# Patient Record
Sex: Male | Born: 1935 | Race: White | Hispanic: No | Smoking: Former smoker
Health system: Southern US, Community
[De-identification: ages and names within clinical notes are randomized; demographics above are authoritative.]

## PROBLEM LIST (undated history)

## (undated) DIAGNOSIS — I441 Atrioventricular block, second degree: Secondary | ICD-10-CM

## (undated) DIAGNOSIS — I480 Paroxysmal atrial fibrillation: Secondary | ICD-10-CM

## (undated) DIAGNOSIS — I509 Heart failure, unspecified: Secondary | ICD-10-CM

## (undated) DIAGNOSIS — D649 Anemia, unspecified: Secondary | ICD-10-CM

## (undated) DIAGNOSIS — E119 Type 2 diabetes mellitus without complications: Secondary | ICD-10-CM

## (undated) HISTORY — DX: Anemia, unspecified: D64.9

## (undated) HISTORY — DX: Paroxysmal atrial fibrillation: I48.0

## (undated) HISTORY — DX: Atrioventricular block, second degree: I44.1

## (undated) HISTORY — DX: Type 2 diabetes mellitus without complications: E11.9

## (undated) HISTORY — PX: PENILE PROSTHESIS IMPLANT: SHX240

---

## 1997-11-07 ENCOUNTER — Inpatient Hospital Stay (HOSPITAL_COMMUNITY): Admission: EM | Admit: 1997-11-07 | Discharge: 1997-11-09 | Payer: Self-pay | Admitting: Internal Medicine

## 1997-11-29 ENCOUNTER — Ambulatory Visit (HOSPITAL_COMMUNITY): Admission: RE | Admit: 1997-11-29 | Discharge: 1997-11-29 | Payer: Self-pay | Admitting: Pulmonary Disease

## 1998-01-26 HISTORY — PX: PERICARDIECTOMY: SHX2214

## 1998-01-26 HISTORY — PX: STERNOTOMY: SHX1057

## 1998-07-01 ENCOUNTER — Inpatient Hospital Stay (HOSPITAL_COMMUNITY): Admission: AD | Admit: 1998-07-01 | Discharge: 1998-07-05 | Payer: Self-pay | Admitting: Internal Medicine

## 1998-07-03 ENCOUNTER — Encounter: Payer: Self-pay | Admitting: Cardiology

## 1998-07-12 ENCOUNTER — Encounter: Payer: Self-pay | Admitting: Surgery

## 1998-07-15 ENCOUNTER — Inpatient Hospital Stay (HOSPITAL_COMMUNITY): Admission: RE | Admit: 1998-07-15 | Discharge: 1998-07-21 | Payer: Self-pay | Admitting: Surgery

## 1998-07-15 ENCOUNTER — Encounter: Payer: Self-pay | Admitting: Surgery

## 1998-07-16 ENCOUNTER — Encounter: Payer: Self-pay | Admitting: Surgery

## 1998-07-17 ENCOUNTER — Encounter: Payer: Self-pay | Admitting: Surgery

## 1998-07-18 ENCOUNTER — Encounter: Payer: Self-pay | Admitting: Surgery

## 1999-09-24 ENCOUNTER — Encounter: Admission: RE | Admit: 1999-09-24 | Discharge: 1999-12-23 | Payer: Self-pay | Admitting: Internal Medicine

## 2003-12-03 ENCOUNTER — Ambulatory Visit: Payer: Self-pay | Admitting: Adult Health

## 2004-01-02 ENCOUNTER — Ambulatory Visit: Payer: Self-pay | Admitting: Internal Medicine

## 2004-02-14 ENCOUNTER — Ambulatory Visit: Payer: Self-pay | Admitting: Internal Medicine

## 2004-04-21 ENCOUNTER — Ambulatory Visit: Payer: Self-pay | Admitting: Internal Medicine

## 2004-04-28 ENCOUNTER — Ambulatory Visit: Payer: Self-pay | Admitting: Internal Medicine

## 2004-05-27 ENCOUNTER — Ambulatory Visit: Payer: Self-pay | Admitting: Internal Medicine

## 2004-08-22 ENCOUNTER — Ambulatory Visit: Payer: Self-pay | Admitting: Internal Medicine

## 2004-10-17 ENCOUNTER — Ambulatory Visit: Payer: Self-pay | Admitting: Internal Medicine

## 2004-11-14 ENCOUNTER — Ambulatory Visit: Payer: Self-pay | Admitting: Internal Medicine

## 2004-12-26 ENCOUNTER — Ambulatory Visit: Payer: Self-pay | Admitting: Internal Medicine

## 2005-02-03 ENCOUNTER — Ambulatory Visit: Payer: Self-pay | Admitting: Internal Medicine

## 2005-03-03 ENCOUNTER — Ambulatory Visit: Payer: Self-pay | Admitting: Gastroenterology

## 2005-03-06 ENCOUNTER — Ambulatory Visit: Payer: Self-pay | Admitting: Internal Medicine

## 2005-03-26 ENCOUNTER — Ambulatory Visit: Payer: Self-pay | Admitting: Internal Medicine

## 2005-04-26 HISTORY — PX: LYMPH NODE BIOPSY: SHX201

## 2005-05-11 ENCOUNTER — Ambulatory Visit: Payer: Self-pay | Admitting: Internal Medicine

## 2005-05-25 ENCOUNTER — Ambulatory Visit (HOSPITAL_BASED_OUTPATIENT_CLINIC_OR_DEPARTMENT_OTHER): Admission: RE | Admit: 2005-05-25 | Discharge: 2005-05-25 | Payer: Self-pay | Admitting: General Surgery

## 2005-05-25 ENCOUNTER — Encounter (INDEPENDENT_AMBULATORY_CARE_PROVIDER_SITE_OTHER): Payer: Self-pay | Admitting: General Surgery

## 2005-05-25 ENCOUNTER — Encounter (INDEPENDENT_AMBULATORY_CARE_PROVIDER_SITE_OTHER): Payer: Self-pay | Admitting: *Deleted

## 2005-05-27 ENCOUNTER — Encounter: Payer: Self-pay | Admitting: Pulmonary Disease

## 2005-06-01 ENCOUNTER — Ambulatory Visit: Payer: Self-pay | Admitting: Internal Medicine

## 2005-06-01 ENCOUNTER — Ambulatory Visit: Payer: Self-pay | Admitting: Oncology

## 2005-06-03 LAB — COMPREHENSIVE METABOLIC PANEL
ALT: 27 U/L (ref 0–40)
AST: 23 U/L (ref 0–37)
CO2: 29 mEq/L (ref 19–32)
Calcium: 9.1 mg/dL (ref 8.4–10.5)
Creatinine, Ser: 1 mg/dL (ref 0.4–1.5)
Sodium: 140 mEq/L (ref 135–145)
Total Bilirubin: 0.5 mg/dL (ref 0.3–1.2)
Total Protein: 6.8 g/dL (ref 6.0–8.3)

## 2005-06-03 LAB — IRON AND TIBC
TIBC: 299 ug/dL (ref 215–435)
UIBC: 223 ug/dL

## 2005-06-03 LAB — CBC WITH DIFFERENTIAL/PLATELET
BASO%: 0.4 % (ref 0.0–2.0)
Eosinophils Absolute: 0.1 10*3/uL (ref 0.0–0.5)
MCHC: 35 g/dL (ref 32.0–35.9)
MCV: 99.6 fL — ABNORMAL HIGH (ref 81.6–98.0)
MONO#: 0.2 10*3/uL (ref 0.1–0.9)
MONO%: 5.7 % (ref 0.0–13.0)
NEUT#: 1.7 10*3/uL (ref 1.5–6.5)
RBC: 3.62 10*6/uL — ABNORMAL LOW (ref 4.20–5.71)
RDW: 13.2 % (ref 11.2–14.6)
WBC: 2.8 10*3/uL — ABNORMAL LOW (ref 4.0–10.0)

## 2005-06-03 LAB — CHCC SMEAR

## 2005-06-04 ENCOUNTER — Ambulatory Visit: Payer: Self-pay | Admitting: Cardiology

## 2005-06-10 ENCOUNTER — Ambulatory Visit (HOSPITAL_COMMUNITY): Admission: RE | Admit: 2005-06-10 | Discharge: 2005-06-10 | Payer: Self-pay | Admitting: Oncology

## 2005-06-11 ENCOUNTER — Ambulatory Visit (HOSPITAL_COMMUNITY): Admission: RE | Admit: 2005-06-11 | Discharge: 2005-06-11 | Payer: Self-pay | Admitting: Oncology

## 2005-06-12 ENCOUNTER — Ambulatory Visit (HOSPITAL_COMMUNITY): Admission: RE | Admit: 2005-06-12 | Discharge: 2005-06-12 | Payer: Self-pay | Admitting: Oncology

## 2005-06-15 ENCOUNTER — Encounter: Payer: Self-pay | Admitting: Oncology

## 2005-06-15 ENCOUNTER — Encounter (INDEPENDENT_AMBULATORY_CARE_PROVIDER_SITE_OTHER): Payer: Self-pay | Admitting: *Deleted

## 2005-06-15 ENCOUNTER — Ambulatory Visit (HOSPITAL_COMMUNITY): Admission: RE | Admit: 2005-06-15 | Discharge: 2005-06-15 | Payer: Self-pay | Admitting: Oncology

## 2005-06-15 ENCOUNTER — Ambulatory Visit: Payer: Self-pay | Admitting: Oncology

## 2005-06-24 LAB — CBC WITH DIFFERENTIAL/PLATELET
Eosinophils Absolute: 0.1 10*3/uL (ref 0.0–0.5)
HCT: 36.8 % — ABNORMAL LOW (ref 38.7–49.9)
LYMPH%: 33.8 % (ref 14.0–48.0)
MONO#: 0.2 10*3/uL (ref 0.1–0.9)
NEUT#: 1.4 10*3/uL — ABNORMAL LOW (ref 1.5–6.5)
NEUT%: 55.7 % (ref 40.0–75.0)
Platelets: 155 10*3/uL (ref 145–400)
RBC: 3.7 10*6/uL — ABNORMAL LOW (ref 4.20–5.71)
WBC: 2.5 10*3/uL — ABNORMAL LOW (ref 4.0–10.0)

## 2005-06-24 LAB — COMPREHENSIVE METABOLIC PANEL
ALT: 23 U/L (ref 0–40)
AST: 20 U/L (ref 0–37)
Alkaline Phosphatase: 45 U/L (ref 39–117)
CO2: 28 mEq/L (ref 19–32)
Sodium: 141 mEq/L (ref 135–145)
Total Bilirubin: 0.4 mg/dL (ref 0.3–1.2)
Total Protein: 7 g/dL (ref 6.0–8.3)

## 2005-07-09 LAB — CBC WITH DIFFERENTIAL/PLATELET
Basophils Absolute: 0 10*3/uL (ref 0.0–0.1)
Eosinophils Absolute: 0 10*3/uL (ref 0.0–0.5)
HGB: 12.1 g/dL — ABNORMAL LOW (ref 13.0–17.1)
MONO#: 0 10*3/uL — ABNORMAL LOW (ref 0.1–0.9)
NEUT#: 3.1 10*3/uL (ref 1.5–6.5)
RDW: 13.5 % (ref 11.2–14.6)
WBC: 3.9 10*3/uL — ABNORMAL LOW (ref 4.0–10.0)
lymph#: 0.8 10*3/uL — ABNORMAL LOW (ref 0.9–3.3)

## 2005-07-13 ENCOUNTER — Ambulatory Visit: Payer: Self-pay | Admitting: Internal Medicine

## 2005-07-16 LAB — COMPREHENSIVE METABOLIC PANEL
ALT: 26 U/L (ref 0–40)
Albumin: 4.1 g/dL (ref 3.5–5.2)
CO2: 28 mEq/L (ref 19–32)
Glucose, Bld: 165 mg/dL — ABNORMAL HIGH (ref 70–99)
Potassium: 3.8 mEq/L (ref 3.5–5.3)
Sodium: 135 mEq/L (ref 135–145)
Total Protein: 6.7 g/dL (ref 6.0–8.3)

## 2005-07-16 LAB — CBC WITH DIFFERENTIAL/PLATELET
Eosinophils Absolute: 0 10*3/uL (ref 0.0–0.5)
MONO#: 0.1 10*3/uL (ref 0.1–0.9)
NEUT#: 5.7 10*3/uL (ref 1.5–6.5)
RBC: 3.52 10*6/uL — ABNORMAL LOW (ref 4.20–5.71)
RDW: 14.1 % (ref 11.2–14.6)
WBC: 6.6 10*3/uL (ref 4.0–10.0)
lymph#: 0.8 10*3/uL — ABNORMAL LOW (ref 0.9–3.3)

## 2005-07-17 ENCOUNTER — Ambulatory Visit: Payer: Self-pay | Admitting: Oncology

## 2005-08-05 LAB — CBC WITH DIFFERENTIAL/PLATELET
Basophils Absolute: 0 10*3/uL (ref 0.0–0.1)
Eosinophils Absolute: 0 10*3/uL (ref 0.0–0.5)
HGB: 11.5 g/dL — ABNORMAL LOW (ref 13.0–17.1)
MCV: 96.6 fL (ref 81.6–98.0)
MONO%: 7 % (ref 0.0–13.0)
NEUT#: 3.1 10*3/uL (ref 1.5–6.5)
RDW: 15.3 % — ABNORMAL HIGH (ref 11.2–14.6)

## 2005-08-05 LAB — COMPREHENSIVE METABOLIC PANEL
Albumin: 4.2 g/dL (ref 3.5–5.2)
Alkaline Phosphatase: 42 U/L (ref 39–117)
BUN: 19 mg/dL (ref 6–23)
CO2: 27 mEq/L (ref 19–32)
Calcium: 9.1 mg/dL (ref 8.4–10.5)
Chloride: 103 mEq/L (ref 96–112)
Glucose, Bld: 185 mg/dL — ABNORMAL HIGH (ref 70–99)
Potassium: 4.1 mEq/L (ref 3.5–5.3)

## 2005-08-26 LAB — CBC WITH DIFFERENTIAL/PLATELET
Eosinophils Absolute: 0 10*3/uL (ref 0.0–0.5)
HCT: 31.7 % — ABNORMAL LOW (ref 38.7–49.9)
LYMPH%: 14.3 % (ref 14.0–48.0)
MCV: 95.9 fL (ref 81.6–98.0)
MONO#: 0.5 10*3/uL (ref 0.1–0.9)
NEUT#: 4.7 10*3/uL (ref 1.5–6.5)
NEUT%: 76 % — ABNORMAL HIGH (ref 40.0–75.0)
Platelets: 298 10*3/uL (ref 145–400)
WBC: 6.2 10*3/uL (ref 4.0–10.0)

## 2005-08-26 LAB — COMPREHENSIVE METABOLIC PANEL
BUN: 16 mg/dL (ref 6–23)
CO2: 27 mEq/L (ref 19–32)
Creatinine, Ser: 0.96 mg/dL (ref 0.40–1.50)
Glucose, Bld: 203 mg/dL — ABNORMAL HIGH (ref 70–99)
Total Bilirubin: 0.4 mg/dL (ref 0.3–1.2)

## 2005-08-26 LAB — LACTATE DEHYDROGENASE: LDH: 208 U/L (ref 94–250)

## 2005-08-28 ENCOUNTER — Ambulatory Visit: Payer: Self-pay | Admitting: Oncology

## 2005-09-02 ENCOUNTER — Ambulatory Visit (HOSPITAL_COMMUNITY): Admission: RE | Admit: 2005-09-02 | Discharge: 2005-09-02 | Payer: Self-pay | Admitting: Oncology

## 2005-09-16 LAB — CBC WITH DIFFERENTIAL/PLATELET
BASO%: 0.3 % (ref 0.0–2.0)
Basophils Absolute: 0 10*3/uL (ref 0.0–0.1)
HCT: 32.5 % — ABNORMAL LOW (ref 38.7–49.9)
HGB: 11.3 g/dL — ABNORMAL LOW (ref 13.0–17.1)
MCHC: 34.7 g/dL (ref 32.0–35.9)
MONO#: 0.4 10*3/uL (ref 0.1–0.9)
NEUT#: 3.9 10*3/uL (ref 1.5–6.5)
NEUT%: 77.3 % — ABNORMAL HIGH (ref 40.0–75.0)
WBC: 5.1 10*3/uL (ref 4.0–10.0)
lymph#: 0.7 10*3/uL — ABNORMAL LOW (ref 0.9–3.3)

## 2005-09-16 LAB — COMPREHENSIVE METABOLIC PANEL
Alkaline Phosphatase: 45 U/L (ref 39–117)
Glucose, Bld: 186 mg/dL — ABNORMAL HIGH (ref 70–99)
Sodium: 141 mEq/L (ref 135–145)
Total Bilirubin: 0.4 mg/dL (ref 0.3–1.2)
Total Protein: 6.5 g/dL (ref 6.0–8.3)

## 2005-10-07 LAB — CBC WITH DIFFERENTIAL/PLATELET
Basophils Absolute: 0 10*3/uL (ref 0.0–0.1)
Eosinophils Absolute: 0 10*3/uL (ref 0.0–0.5)
HCT: 25.7 % — ABNORMAL LOW (ref 38.7–49.9)
HGB: 8.8 g/dL — ABNORMAL LOW (ref 13.0–17.1)
LYMPH%: 3.9 % — ABNORMAL LOW (ref 14.0–48.0)
MCHC: 34.3 g/dL (ref 32.0–35.9)
MONO#: 0.7 10*3/uL (ref 0.1–0.9)
NEUT#: 7.8 10*3/uL — ABNORMAL HIGH (ref 1.5–6.5)
NEUT%: 88.3 % — ABNORMAL HIGH (ref 40.0–75.0)
Platelets: 161 10*3/uL (ref 145–400)
WBC: 8.9 10*3/uL (ref 4.0–10.0)

## 2005-10-07 LAB — COMPREHENSIVE METABOLIC PANEL
BUN: 11 mg/dL (ref 6–23)
CO2: 28 mEq/L (ref 19–32)
Calcium: 8.3 mg/dL — ABNORMAL LOW (ref 8.4–10.5)
Creatinine, Ser: 0.96 mg/dL (ref 0.40–1.50)
Glucose, Bld: 196 mg/dL — ABNORMAL HIGH (ref 70–99)
Total Bilirubin: 0.8 mg/dL (ref 0.3–1.2)

## 2005-10-07 LAB — LACTATE DEHYDROGENASE: LDH: 154 U/L (ref 94–250)

## 2005-10-11 ENCOUNTER — Encounter: Payer: Self-pay | Admitting: Vascular Surgery

## 2005-10-11 ENCOUNTER — Inpatient Hospital Stay (HOSPITAL_COMMUNITY): Admission: EM | Admit: 2005-10-11 | Discharge: 2005-10-15 | Payer: Self-pay | Admitting: Emergency Medicine

## 2005-10-12 ENCOUNTER — Ambulatory Visit: Payer: Self-pay | Admitting: Oncology

## 2005-10-13 ENCOUNTER — Ambulatory Visit: Payer: Self-pay | Admitting: Oncology

## 2005-10-20 LAB — CBC WITH DIFFERENTIAL/PLATELET
BASO%: 0.3 % (ref 0.0–2.0)
Basophils Absolute: 0 10*3/uL (ref 0.0–0.1)
HCT: 34.5 % — ABNORMAL LOW (ref 38.7–49.9)
HGB: 11.7 g/dL — ABNORMAL LOW (ref 13.0–17.1)
MONO#: 0.5 10*3/uL (ref 0.1–0.9)
NEUT%: 72 % (ref 40.0–75.0)
RDW: 17.3 % — ABNORMAL HIGH (ref 11.2–14.6)
WBC: 4.9 10*3/uL (ref 4.0–10.0)
lymph#: 0.8 10*3/uL — ABNORMAL LOW (ref 0.9–3.3)

## 2005-10-20 LAB — COMPREHENSIVE METABOLIC PANEL
AST: 19 U/L (ref 0–37)
Alkaline Phosphatase: 57 U/L (ref 39–117)
BUN: 15 mg/dL (ref 6–23)
Calcium: 9.4 mg/dL (ref 8.4–10.5)
Chloride: 100 mEq/L (ref 96–112)
Creatinine, Ser: 0.97 mg/dL (ref 0.40–1.50)

## 2005-10-22 ENCOUNTER — Ambulatory Visit: Payer: Self-pay | Admitting: Infectious Diseases

## 2005-10-22 ENCOUNTER — Ambulatory Visit (HOSPITAL_COMMUNITY): Admission: RE | Admit: 2005-10-22 | Discharge: 2005-10-23 | Payer: Self-pay | Admitting: Urology

## 2005-11-10 LAB — COMPREHENSIVE METABOLIC PANEL
Alkaline Phosphatase: 45 U/L (ref 39–117)
Creatinine, Ser: 0.94 mg/dL (ref 0.40–1.50)
Glucose, Bld: 116 mg/dL — ABNORMAL HIGH (ref 70–99)
Sodium: 141 mEq/L (ref 135–145)
Total Bilirubin: 0.5 mg/dL (ref 0.3–1.2)
Total Protein: 6.9 g/dL (ref 6.0–8.3)

## 2005-11-10 LAB — CBC WITH DIFFERENTIAL/PLATELET
Eosinophils Absolute: 0.2 10*3/uL (ref 0.0–0.5)
LYMPH%: 25.9 % (ref 14.0–48.0)
MCHC: 34.6 g/dL (ref 32.0–35.9)
MCV: 93.4 fL (ref 81.6–98.0)
MONO%: 13.3 % — ABNORMAL HIGH (ref 0.0–13.0)
NEUT#: 1.7 10*3/uL (ref 1.5–6.5)
NEUT%: 52.6 % (ref 40.0–75.0)
Platelets: 167 10*3/uL (ref 145–400)
RBC: 3.92 10*6/uL — ABNORMAL LOW (ref 4.20–5.71)

## 2005-11-12 ENCOUNTER — Ambulatory Visit: Payer: Self-pay | Admitting: Internal Medicine

## 2005-11-19 ENCOUNTER — Ambulatory Visit: Payer: Self-pay | Admitting: Internal Medicine

## 2005-11-24 ENCOUNTER — Ambulatory Visit: Payer: Self-pay | Admitting: Oncology

## 2005-11-24 LAB — COMPREHENSIVE METABOLIC PANEL
Albumin: 4.1 g/dL (ref 3.5–5.2)
Alkaline Phosphatase: 41 U/L (ref 39–117)
BUN: 16 mg/dL (ref 6–23)
CO2: 29 mEq/L (ref 19–32)
Calcium: 9.2 mg/dL (ref 8.4–10.5)
Chloride: 101 mEq/L (ref 96–112)
Glucose, Bld: 163 mg/dL — ABNORMAL HIGH (ref 70–99)
Potassium: 3.9 mEq/L (ref 3.5–5.3)
Total Protein: 6.6 g/dL (ref 6.0–8.3)

## 2005-11-24 LAB — CBC WITH DIFFERENTIAL/PLATELET
Basophils Absolute: 0 10*3/uL (ref 0.0–0.1)
Eosinophils Absolute: 0.1 10*3/uL (ref 0.0–0.5)
HGB: 13.2 g/dL (ref 13.0–17.1)
LYMPH%: 23 % (ref 14.0–48.0)
MONO#: 0.4 10*3/uL (ref 0.1–0.9)
NEUT#: 1.6 10*3/uL (ref 1.5–6.5)
Platelets: 153 10*3/uL (ref 145–400)
RBC: 4.05 10*6/uL — ABNORMAL LOW (ref 4.20–5.71)
WBC: 2.7 10*3/uL — ABNORMAL LOW (ref 4.0–10.0)

## 2005-12-11 ENCOUNTER — Ambulatory Visit: Payer: Self-pay | Admitting: Internal Medicine

## 2005-12-15 ENCOUNTER — Ambulatory Visit: Payer: Self-pay | Admitting: Pulmonary Disease

## 2005-12-15 ENCOUNTER — Ambulatory Visit (HOSPITAL_COMMUNITY): Admission: RE | Admit: 2005-12-15 | Discharge: 2005-12-15 | Payer: Self-pay | Admitting: Oncology

## 2005-12-18 LAB — CBC WITH DIFFERENTIAL/PLATELET
BASO%: 0.6 % (ref 0.0–2.0)
LYMPH%: 16.2 % (ref 14.0–48.0)
MCH: 32.5 pg (ref 28.0–33.4)
MCHC: 34.7 g/dL (ref 32.0–35.9)
MCV: 93.9 fL (ref 81.6–98.0)
MONO%: 14.5 % — ABNORMAL HIGH (ref 0.0–13.0)
Platelets: 257 10*3/uL (ref 145–400)
RBC: 3.85 10*6/uL — ABNORMAL LOW (ref 4.20–5.71)
WBC: 4.9 10*3/uL (ref 4.0–10.0)

## 2005-12-18 LAB — COMPREHENSIVE METABOLIC PANEL
ALT: 22 U/L (ref 0–53)
Alkaline Phosphatase: 37 U/L — ABNORMAL LOW (ref 39–117)
Sodium: 141 mEq/L (ref 135–145)
Total Bilirubin: 0.4 mg/dL (ref 0.3–1.2)
Total Protein: 6.7 g/dL (ref 6.0–8.3)

## 2005-12-21 ENCOUNTER — Ambulatory Visit (HOSPITAL_BASED_OUTPATIENT_CLINIC_OR_DEPARTMENT_OTHER): Admission: RE | Admit: 2005-12-21 | Discharge: 2005-12-21 | Payer: Self-pay | Admitting: Urology

## 2005-12-29 ENCOUNTER — Ambulatory Visit: Payer: Self-pay | Admitting: Pulmonary Disease

## 2006-02-02 ENCOUNTER — Ambulatory Visit: Payer: Self-pay | Admitting: Oncology

## 2006-02-26 ENCOUNTER — Ambulatory Visit: Payer: Self-pay | Admitting: Internal Medicine

## 2006-03-19 ENCOUNTER — Ambulatory Visit: Payer: Self-pay | Admitting: Oncology

## 2006-03-19 LAB — CBC WITH DIFFERENTIAL/PLATELET
BASO%: 0.3 % (ref 0.0–2.0)
Basophils Absolute: 0 10*3/uL (ref 0.0–0.1)
EOS%: 3.3 % (ref 0.0–7.0)
HCT: 38.6 % — ABNORMAL LOW (ref 38.7–49.9)
HGB: 13.9 g/dL (ref 13.0–17.1)
LYMPH%: 33 % (ref 14.0–48.0)
MCH: 34.4 pg — ABNORMAL HIGH (ref 28.0–33.4)
MCHC: 35.9 g/dL (ref 32.0–35.9)
MCV: 95.8 fL (ref 81.6–98.0)
NEUT%: 52.7 % (ref 40.0–75.0)
Platelets: 162 10*3/uL (ref 145–400)

## 2006-03-19 LAB — COMPREHENSIVE METABOLIC PANEL
ALT: 24 U/L (ref 0–53)
AST: 19 U/L (ref 0–37)
BUN: 18 mg/dL (ref 6–23)
CO2: 31 mEq/L (ref 19–32)
Calcium: 9.5 mg/dL (ref 8.4–10.5)
Creatinine, Ser: 0.99 mg/dL (ref 0.40–1.50)
Total Bilirubin: 0.5 mg/dL (ref 0.3–1.2)

## 2006-04-19 ENCOUNTER — Ambulatory Visit: Payer: Self-pay | Admitting: Internal Medicine

## 2006-05-04 ENCOUNTER — Ambulatory Visit (HOSPITAL_COMMUNITY): Admission: RE | Admit: 2006-05-04 | Discharge: 2006-05-04 | Payer: Self-pay | Admitting: Oncology

## 2006-06-14 ENCOUNTER — Ambulatory Visit: Payer: Self-pay | Admitting: Pulmonary Disease

## 2006-06-17 ENCOUNTER — Ambulatory Visit: Payer: Self-pay | Admitting: Oncology

## 2006-06-18 ENCOUNTER — Ambulatory Visit: Payer: Self-pay | Admitting: Internal Medicine

## 2006-06-18 LAB — CONVERTED CEMR LAB
CO2: 35 meq/L — ABNORMAL HIGH (ref 19–32)
GFR calc Af Amer: 95 mL/min
Glucose, Bld: 113 mg/dL — ABNORMAL HIGH (ref 70–99)
Potassium: 4.8 meq/L (ref 3.5–5.1)

## 2006-06-22 LAB — COMPREHENSIVE METABOLIC PANEL
BUN: 19 mg/dL (ref 6–23)
CO2: 27 mEq/L (ref 19–32)
Calcium: 9.4 mg/dL (ref 8.4–10.5)
Chloride: 102 mEq/L (ref 96–112)
Creatinine, Ser: 0.98 mg/dL (ref 0.40–1.50)
Glucose, Bld: 162 mg/dL — ABNORMAL HIGH (ref 70–99)
Total Bilirubin: 0.7 mg/dL (ref 0.3–1.2)

## 2006-06-22 LAB — CBC WITH DIFFERENTIAL/PLATELET
BASO%: 0.5 % (ref 0.0–2.0)
Basophils Absolute: 0 10*3/uL (ref 0.0–0.1)
HCT: 36 % — ABNORMAL LOW (ref 38.7–49.9)
HGB: 13.1 g/dL (ref 13.0–17.1)
LYMPH%: 28.8 % (ref 14.0–48.0)
MCHC: 36.5 g/dL — ABNORMAL HIGH (ref 32.0–35.9)
MONO#: 0.2 10*3/uL (ref 0.1–0.9)
NEUT%: 61.4 % (ref 40.0–75.0)
Platelets: 159 10*3/uL (ref 145–400)
WBC: 2.5 10*3/uL — ABNORMAL LOW (ref 4.0–10.0)
lymph#: 0.7 10*3/uL — ABNORMAL LOW (ref 0.9–3.3)

## 2006-06-24 ENCOUNTER — Ambulatory Visit (HOSPITAL_COMMUNITY): Admission: RE | Admit: 2006-06-24 | Discharge: 2006-06-24 | Payer: Self-pay | Admitting: Critical Care Medicine

## 2006-06-30 ENCOUNTER — Ambulatory Visit (HOSPITAL_COMMUNITY): Admission: RE | Admit: 2006-06-30 | Discharge: 2006-06-30 | Payer: Self-pay | Admitting: Oncology

## 2006-07-26 ENCOUNTER — Ambulatory Visit: Payer: Self-pay | Admitting: Internal Medicine

## 2006-10-08 ENCOUNTER — Ambulatory Visit: Payer: Self-pay | Admitting: Oncology

## 2006-10-12 LAB — COMPREHENSIVE METABOLIC PANEL
AST: 18 U/L (ref 0–37)
Alkaline Phosphatase: 36 U/L — ABNORMAL LOW (ref 39–117)
BUN: 13 mg/dL (ref 6–23)
Glucose, Bld: 119 mg/dL — ABNORMAL HIGH (ref 70–99)
Sodium: 137 mEq/L (ref 135–145)
Total Bilirubin: 0.6 mg/dL (ref 0.3–1.2)

## 2006-10-12 LAB — CBC WITH DIFFERENTIAL/PLATELET
Basophils Absolute: 0 10*3/uL (ref 0.0–0.1)
EOS%: 3.6 % (ref 0.0–7.0)
Eosinophils Absolute: 0.1 10*3/uL (ref 0.0–0.5)
LYMPH%: 34.4 % (ref 14.0–48.0)
MCH: 35.6 pg — ABNORMAL HIGH (ref 28.0–33.4)
MCV: 99.3 fL — ABNORMAL HIGH (ref 81.6–98.0)
MONO%: 9.3 % (ref 0.0–13.0)
NEUT#: 1.6 10*3/uL (ref 1.5–6.5)
Platelets: 165 10*3/uL (ref 145–400)
RBC: 3.51 10*6/uL — ABNORMAL LOW (ref 4.20–5.71)

## 2006-10-18 ENCOUNTER — Ambulatory Visit (HOSPITAL_COMMUNITY): Admission: RE | Admit: 2006-10-18 | Discharge: 2006-10-18 | Payer: Self-pay | Admitting: Oncology

## 2006-11-08 ENCOUNTER — Ambulatory Visit: Payer: Self-pay | Admitting: Internal Medicine

## 2006-11-08 LAB — CONVERTED CEMR LAB
ALT: 22 units/L (ref 0–53)
Alkaline Phosphatase: 31 units/L — ABNORMAL LOW (ref 39–117)
BUN: 17 mg/dL (ref 6–23)
Basophils Relative: 0.6 % (ref 0.0–1.0)
CRP, High Sensitivity: 4 (ref 0.00–5.00)
Calcium: 9.4 mg/dL (ref 8.4–10.5)
Eosinophils Relative: 4.1 % (ref 0.0–5.0)
GFR calc non Af Amer: 70 mL/min
Ketones, ur: NEGATIVE mg/dL
Nitrite: NEGATIVE
Platelets: 156 10*3/uL (ref 150–400)
Potassium: 4.8 meq/L (ref 3.5–5.1)
RBC: 3.55 M/uL — ABNORMAL LOW (ref 4.22–5.81)
RDW: 13.1 % (ref 11.5–14.6)
Specific Gravity, Urine: >=1.03 (ref 1.000–1.03)
Total Bilirubin: 1.3 mg/dL — ABNORMAL HIGH (ref 0.3–1.2)
Total CHOL/HDL Ratio: 5.2
Total Protein, Urine: NEGATIVE mg/dL
Total Protein: 6.6 g/dL (ref 6.0–8.3)
Triglycerides: 277 mg/dL (ref 0–149)
Urine Glucose: NEGATIVE mg/dL
WBC: 2.5 10*3/uL — ABNORMAL LOW (ref 4.5–10.5)
pH: 5.5 (ref 5.0–8.0)

## 2006-11-15 ENCOUNTER — Ambulatory Visit: Payer: Self-pay | Admitting: Internal Medicine

## 2006-11-18 ENCOUNTER — Ambulatory Visit: Payer: Self-pay

## 2006-12-17 ENCOUNTER — Ambulatory Visit: Payer: Self-pay | Admitting: Internal Medicine

## 2007-01-07 ENCOUNTER — Ambulatory Visit: Payer: Self-pay | Admitting: Oncology

## 2007-01-11 ENCOUNTER — Encounter: Payer: Self-pay | Admitting: Internal Medicine

## 2007-01-11 LAB — CBC WITH DIFFERENTIAL/PLATELET
Basophils Absolute: 0 10*3/uL (ref 0.0–0.1)
Eosinophils Absolute: 0.1 10*3/uL (ref 0.0–0.5)
HCT: 36 % — ABNORMAL LOW (ref 38.7–49.9)
HGB: 13 g/dL (ref 13.0–17.1)
LYMPH%: 35.1 % (ref 14.0–48.0)
MONO#: 0.2 10*3/uL (ref 0.1–0.9)
NEUT#: 1 10*3/uL — ABNORMAL LOW (ref 1.5–6.5)
NEUT%: 53 % (ref 40.0–75.0)
Platelets: 143 10*3/uL — ABNORMAL LOW (ref 145–400)
WBC: 1.9 10*3/uL — ABNORMAL LOW (ref 4.0–10.0)

## 2007-01-11 LAB — COMPREHENSIVE METABOLIC PANEL
ALT: 20 U/L (ref 0–53)
Albumin: 4.4 g/dL (ref 3.5–5.2)
CO2: 28 mEq/L (ref 19–32)
Calcium: 9.4 mg/dL (ref 8.4–10.5)
Chloride: 103 mEq/L (ref 96–112)
Glucose, Bld: 221 mg/dL — ABNORMAL HIGH (ref 70–99)
Sodium: 141 mEq/L (ref 135–145)
Total Bilirubin: 0.7 mg/dL (ref 0.3–1.2)
Total Protein: 6.5 g/dL (ref 6.0–8.3)

## 2007-01-11 LAB — LACTATE DEHYDROGENASE: LDH: 147 U/L (ref 94–250)

## 2007-01-31 ENCOUNTER — Ambulatory Visit: Payer: Self-pay | Admitting: Internal Medicine

## 2007-04-04 ENCOUNTER — Telehealth (INDEPENDENT_AMBULATORY_CARE_PROVIDER_SITE_OTHER): Payer: Self-pay | Admitting: *Deleted

## 2007-04-11 ENCOUNTER — Ambulatory Visit (HOSPITAL_COMMUNITY): Admission: RE | Admit: 2007-04-11 | Discharge: 2007-04-11 | Payer: Self-pay | Admitting: Oncology

## 2007-04-12 ENCOUNTER — Ambulatory Visit: Payer: Self-pay | Admitting: Oncology

## 2007-04-14 ENCOUNTER — Encounter: Payer: Self-pay | Admitting: Internal Medicine

## 2007-04-14 LAB — CBC WITH DIFFERENTIAL/PLATELET
BASO%: 0 % (ref 0.0–2.0)
HCT: 35.9 % — ABNORMAL LOW (ref 38.7–49.9)
LYMPH%: 34.1 % (ref 14.0–48.0)
MCHC: 36.3 g/dL — ABNORMAL HIGH (ref 32.0–35.9)
MCV: 99.2 fL — ABNORMAL HIGH (ref 81.6–98.0)
MONO%: 8.6 % (ref 0.0–13.0)
NEUT%: 53.9 % (ref 40.0–75.0)
Platelets: 141 10*3/uL — ABNORMAL LOW (ref 145–400)
RBC: 3.62 10*6/uL — ABNORMAL LOW (ref 4.20–5.71)

## 2007-04-14 LAB — COMPREHENSIVE METABOLIC PANEL
ALT: 19 U/L (ref 0–53)
Alkaline Phosphatase: 31 U/L — ABNORMAL LOW (ref 39–117)
Creatinine, Ser: 0.89 mg/dL (ref 0.40–1.50)
Glucose, Bld: 165 mg/dL — ABNORMAL HIGH (ref 70–99)
Sodium: 140 mEq/L (ref 135–145)
Total Bilirubin: 0.6 mg/dL (ref 0.3–1.2)
Total Protein: 6.5 g/dL (ref 6.0–8.3)

## 2007-05-25 ENCOUNTER — Ambulatory Visit: Payer: Self-pay | Admitting: Internal Medicine

## 2007-07-13 ENCOUNTER — Ambulatory Visit: Payer: Self-pay | Admitting: Oncology

## 2007-07-13 ENCOUNTER — Emergency Department (HOSPITAL_COMMUNITY): Admission: EM | Admit: 2007-07-13 | Discharge: 2007-07-13 | Payer: Self-pay | Admitting: Family Medicine

## 2007-07-15 LAB — COMPREHENSIVE METABOLIC PANEL
ALT: 16 U/L (ref 0–53)
AST: 16 U/L (ref 0–37)
Albumin: 4.6 g/dL (ref 3.5–5.2)
Alkaline Phosphatase: 35 U/L — ABNORMAL LOW (ref 39–117)
Glucose, Bld: 121 mg/dL — ABNORMAL HIGH (ref 70–99)
Potassium: 4.1 mEq/L (ref 3.5–5.3)
Sodium: 142 mEq/L (ref 135–145)
Total Protein: 6.7 g/dL (ref 6.0–8.3)

## 2007-07-15 LAB — CBC WITH DIFFERENTIAL/PLATELET
BASO%: 0.4 % (ref 0.0–2.0)
EOS%: 2.6 % (ref 0.0–7.0)
Eosinophils Absolute: 0.1 10*3/uL (ref 0.0–0.5)
MCHC: 35.7 g/dL (ref 32.0–35.9)
MCV: 100.6 fL — ABNORMAL HIGH (ref 81.6–98.0)
MONO%: 7.9 % (ref 0.0–13.0)
NEUT#: 1.1 10*3/uL — ABNORMAL LOW (ref 1.5–6.5)
RBC: 3.6 10*6/uL — ABNORMAL LOW (ref 4.20–5.71)
RDW: 13.8 % (ref 11.2–14.6)

## 2007-10-10 ENCOUNTER — Telehealth: Payer: Self-pay | Admitting: Internal Medicine

## 2007-10-13 ENCOUNTER — Ambulatory Visit: Payer: Self-pay | Admitting: Oncology

## 2007-10-17 ENCOUNTER — Ambulatory Visit (HOSPITAL_COMMUNITY): Admission: RE | Admit: 2007-10-17 | Discharge: 2007-10-17 | Payer: Self-pay | Admitting: Oncology

## 2007-10-17 LAB — CBC WITH DIFFERENTIAL/PLATELET
BASO%: 0.7 % (ref 0.0–2.0)
Eosinophils Absolute: 0.1 10*3/uL (ref 0.0–0.5)
HCT: 36.3 % — ABNORMAL LOW (ref 38.7–49.9)
HGB: 13.2 g/dL (ref 13.0–17.1)
LYMPH%: 37.3 % (ref 14.0–48.0)
MCHC: 36.3 g/dL — ABNORMAL HIGH (ref 32.0–35.9)
MONO#: 0.2 10*3/uL (ref 0.1–0.9)
NEUT#: 1.3 10*3/uL — ABNORMAL LOW (ref 1.5–6.5)
NEUT%: 52.9 % (ref 40.0–75.0)
Platelets: 142 10*3/uL — ABNORMAL LOW (ref 145–400)
WBC: 2.4 10*3/uL — ABNORMAL LOW (ref 4.0–10.0)
lymph#: 0.9 10*3/uL (ref 0.9–3.3)

## 2007-10-17 LAB — COMPREHENSIVE METABOLIC PANEL
Alkaline Phosphatase: 28 U/L — ABNORMAL LOW (ref 39–117)
BUN: 16 mg/dL (ref 6–23)
CO2: 28 mEq/L (ref 19–32)
Creatinine, Ser: 1.02 mg/dL (ref 0.40–1.50)
Glucose, Bld: 112 mg/dL — ABNORMAL HIGH (ref 70–99)
Sodium: 141 mEq/L (ref 135–145)
Total Bilirubin: 0.6 mg/dL (ref 0.3–1.2)
Total Protein: 6.9 g/dL (ref 6.0–8.3)

## 2007-10-17 LAB — LACTATE DEHYDROGENASE: LDH: 183 U/L (ref 94–250)

## 2007-10-21 ENCOUNTER — Encounter: Payer: Self-pay | Admitting: Internal Medicine

## 2007-11-08 ENCOUNTER — Ambulatory Visit: Payer: Self-pay | Admitting: Internal Medicine

## 2007-11-18 ENCOUNTER — Ambulatory Visit: Payer: Self-pay | Admitting: Cardiology

## 2007-11-18 ENCOUNTER — Ambulatory Visit: Payer: Self-pay | Admitting: Internal Medicine

## 2007-11-22 ENCOUNTER — Ambulatory Visit: Payer: Self-pay | Admitting: Internal Medicine

## 2007-11-23 ENCOUNTER — Encounter: Payer: Self-pay | Admitting: Internal Medicine

## 2007-11-23 ENCOUNTER — Encounter: Admission: RE | Admit: 2007-11-23 | Discharge: 2007-11-23 | Payer: Self-pay | Admitting: Family Medicine

## 2007-11-24 ENCOUNTER — Encounter: Payer: Self-pay | Admitting: Internal Medicine

## 2007-12-04 ENCOUNTER — Emergency Department (HOSPITAL_COMMUNITY): Admission: EM | Admit: 2007-12-04 | Discharge: 2007-12-04 | Payer: Self-pay | Admitting: Emergency Medicine

## 2007-12-05 ENCOUNTER — Telehealth: Payer: Self-pay | Admitting: Internal Medicine

## 2007-12-05 ENCOUNTER — Telehealth (INDEPENDENT_AMBULATORY_CARE_PROVIDER_SITE_OTHER): Payer: Self-pay | Admitting: *Deleted

## 2007-12-07 ENCOUNTER — Ambulatory Visit: Payer: Self-pay | Admitting: Pulmonary Disease

## 2007-12-07 ENCOUNTER — Ambulatory Visit: Payer: Self-pay | Admitting: Internal Medicine

## 2008-01-16 ENCOUNTER — Telehealth (INDEPENDENT_AMBULATORY_CARE_PROVIDER_SITE_OTHER): Payer: Self-pay | Admitting: *Deleted

## 2008-01-16 ENCOUNTER — Ambulatory Visit: Payer: Self-pay | Admitting: Internal Medicine

## 2008-01-30 ENCOUNTER — Ambulatory Visit: Payer: Self-pay | Admitting: Oncology

## 2008-02-01 ENCOUNTER — Encounter: Payer: Self-pay | Admitting: Internal Medicine

## 2008-02-01 LAB — CBC WITH DIFFERENTIAL/PLATELET
BASO%: 0.5 % (ref 0.0–2.0)
Eosinophils Absolute: 0.1 10*3/uL (ref 0.0–0.5)
LYMPH%: 41 % (ref 14.0–48.0)
MCHC: 35.4 g/dL (ref 32.0–35.9)
MCV: 103.2 fL — ABNORMAL HIGH (ref 81.6–98.0)
MONO#: 0.2 10*3/uL (ref 0.1–0.9)
MONO%: 7.1 % (ref 0.0–13.0)
NEUT#: 1.1 10*3/uL — ABNORMAL LOW (ref 1.5–6.5)
RBC: 3.53 10*6/uL — ABNORMAL LOW (ref 4.20–5.71)
RDW: 14.6 % (ref 11.2–14.6)
WBC: 2.4 10*3/uL — ABNORMAL LOW (ref 4.0–10.0)

## 2008-02-01 LAB — COMPREHENSIVE METABOLIC PANEL
ALT: 24 U/L (ref 0–53)
Albumin: 4.5 g/dL (ref 3.5–5.2)
Alkaline Phosphatase: 37 U/L — ABNORMAL LOW (ref 39–117)
Glucose, Bld: 163 mg/dL — ABNORMAL HIGH (ref 70–99)
Potassium: 4.5 mEq/L (ref 3.5–5.3)
Sodium: 141 mEq/L (ref 135–145)
Total Bilirubin: 0.8 mg/dL (ref 0.3–1.2)
Total Protein: 7 g/dL (ref 6.0–8.3)

## 2008-02-15 ENCOUNTER — Ambulatory Visit: Payer: Self-pay | Admitting: Internal Medicine

## 2008-05-01 ENCOUNTER — Ambulatory Visit: Payer: Self-pay | Admitting: Internal Medicine

## 2008-06-11 ENCOUNTER — Ambulatory Visit: Payer: Self-pay | Admitting: Internal Medicine

## 2008-06-28 ENCOUNTER — Ambulatory Visit: Payer: Self-pay | Admitting: Oncology

## 2008-07-02 ENCOUNTER — Ambulatory Visit (HOSPITAL_COMMUNITY): Admission: RE | Admit: 2008-07-02 | Discharge: 2008-07-02 | Payer: Self-pay | Admitting: Oncology

## 2008-07-02 LAB — COMPREHENSIVE METABOLIC PANEL
AST: 24 U/L (ref 0–37)
BUN: 17 mg/dL (ref 6–23)
CO2: 32 mEq/L (ref 19–32)
Calcium: 9.5 mg/dL (ref 8.4–10.5)
Chloride: 103 mEq/L (ref 96–112)
Creatinine, Ser: 0.94 mg/dL (ref 0.40–1.50)
Total Bilirubin: 0.7 mg/dL (ref 0.3–1.2)

## 2008-07-02 LAB — CBC WITH DIFFERENTIAL/PLATELET
BASO%: 0 % (ref 0.0–2.0)
HCT: 34.7 % — ABNORMAL LOW (ref 38.4–49.9)
LYMPH%: 41.2 % (ref 14.0–49.0)
MCHC: 35.7 g/dL (ref 32.0–36.0)
MCV: 100 fL — ABNORMAL HIGH (ref 79.3–98.0)
MONO#: 0.2 10*3/uL (ref 0.1–0.9)
MONO%: 7 % (ref 0.0–14.0)
NEUT%: 49.6 % (ref 39.0–75.0)
Platelets: 123 10*3/uL — ABNORMAL LOW (ref 140–400)
WBC: 2.3 10*3/uL — ABNORMAL LOW (ref 4.0–10.3)

## 2008-07-02 LAB — LACTATE DEHYDROGENASE: LDH: 147 U/L (ref 94–250)

## 2008-07-05 ENCOUNTER — Encounter: Payer: Self-pay | Admitting: Internal Medicine

## 2008-10-02 ENCOUNTER — Ambulatory Visit: Payer: Self-pay | Admitting: Internal Medicine

## 2008-10-03 LAB — CONVERTED CEMR LAB
BUN: 14 mg/dL (ref 6–23)
CO2: 33 meq/L — ABNORMAL HIGH (ref 19–32)
Chloride: 102 meq/L (ref 96–112)
Creatinine, Ser: 1.1 mg/dL (ref 0.4–1.5)
Potassium: 4.2 meq/L (ref 3.5–5.1)
Vit D, 25-Hydroxy: 42 ng/mL (ref 30–89)

## 2008-10-30 ENCOUNTER — Ambulatory Visit: Payer: Self-pay | Admitting: Pulmonary Disease

## 2008-10-30 ENCOUNTER — Encounter: Payer: Self-pay | Admitting: Pulmonary Disease

## 2008-10-30 ENCOUNTER — Ambulatory Visit: Payer: Self-pay | Admitting: Cardiology

## 2008-10-30 ENCOUNTER — Inpatient Hospital Stay (HOSPITAL_COMMUNITY): Admission: EM | Admit: 2008-10-30 | Discharge: 2008-10-31 | Payer: Self-pay | Admitting: Emergency Medicine

## 2008-11-05 ENCOUNTER — Ambulatory Visit: Payer: Self-pay | Admitting: Oncology

## 2008-11-07 ENCOUNTER — Encounter: Payer: Self-pay | Admitting: Internal Medicine

## 2008-11-07 LAB — CBC WITH DIFFERENTIAL/PLATELET
BASO%: 0.4 % (ref 0.0–2.0)
EOS%: 2.3 % (ref 0.0–7.0)
HCT: 31.2 % — ABNORMAL LOW (ref 38.4–49.9)
LYMPH%: 48 % (ref 14.0–49.0)
MCH: 38.1 pg — ABNORMAL HIGH (ref 27.2–33.4)
MCHC: 35.5 g/dL (ref 32.0–36.0)
MONO%: 7.5 % (ref 0.0–14.0)
NEUT%: 41.8 % (ref 39.0–75.0)
Platelets: 119 10*3/uL — ABNORMAL LOW (ref 140–400)
RBC: 2.91 10*6/uL — ABNORMAL LOW (ref 4.20–5.82)
WBC: 2.1 10*3/uL — ABNORMAL LOW (ref 4.0–10.3)

## 2008-11-07 LAB — COMPREHENSIVE METABOLIC PANEL
ALT: 16 U/L (ref 0–53)
AST: 18 U/L (ref 0–37)
Alkaline Phosphatase: 28 U/L — ABNORMAL LOW (ref 39–117)
Creatinine, Ser: 0.94 mg/dL (ref 0.40–1.50)
Sodium: 138 mEq/L (ref 135–145)
Total Bilirubin: 0.7 mg/dL (ref 0.3–1.2)
Total Protein: 6.9 g/dL (ref 6.0–8.3)

## 2008-11-08 ENCOUNTER — Ambulatory Visit: Payer: Self-pay | Admitting: Internal Medicine

## 2008-11-10 ENCOUNTER — Telehealth: Payer: Self-pay | Admitting: Cardiology

## 2008-11-10 ENCOUNTER — Inpatient Hospital Stay (HOSPITAL_COMMUNITY): Admission: EM | Admit: 2008-11-10 | Discharge: 2008-11-12 | Payer: Self-pay | Admitting: Emergency Medicine

## 2008-11-10 ENCOUNTER — Ambulatory Visit: Payer: Self-pay | Admitting: Cardiology

## 2008-11-12 ENCOUNTER — Encounter: Payer: Self-pay | Admitting: Cardiology

## 2008-11-12 ENCOUNTER — Encounter (INDEPENDENT_AMBULATORY_CARE_PROVIDER_SITE_OTHER): Payer: Self-pay | Admitting: Internal Medicine

## 2008-11-12 ENCOUNTER — Ambulatory Visit: Payer: Self-pay | Admitting: Vascular Surgery

## 2008-11-14 ENCOUNTER — Ambulatory Visit: Payer: Self-pay | Admitting: Cardiology

## 2008-11-21 ENCOUNTER — Ambulatory Visit: Payer: Self-pay | Admitting: Cardiology

## 2008-11-27 ENCOUNTER — Telehealth (INDEPENDENT_AMBULATORY_CARE_PROVIDER_SITE_OTHER): Payer: Self-pay | Admitting: *Deleted

## 2008-12-03 ENCOUNTER — Ambulatory Visit: Payer: Self-pay | Admitting: Internal Medicine

## 2008-12-10 ENCOUNTER — Ambulatory Visit: Payer: Self-pay | Admitting: Cardiology

## 2008-12-11 ENCOUNTER — Inpatient Hospital Stay (HOSPITAL_COMMUNITY): Admission: EM | Admit: 2008-12-11 | Discharge: 2008-12-12 | Payer: Self-pay | Admitting: Emergency Medicine

## 2008-12-11 ENCOUNTER — Encounter: Payer: Self-pay | Admitting: Cardiology

## 2008-12-11 ENCOUNTER — Encounter: Payer: Self-pay | Admitting: Internal Medicine

## 2008-12-11 DIAGNOSIS — I441 Atrioventricular block, second degree: Secondary | ICD-10-CM

## 2008-12-11 HISTORY — DX: Atrioventricular block, second degree: I44.1

## 2008-12-11 HISTORY — PX: PACEMAKER INSERTION: SHX728

## 2008-12-12 ENCOUNTER — Encounter: Payer: Self-pay | Admitting: Cardiology

## 2008-12-18 ENCOUNTER — Ambulatory Visit: Payer: Self-pay | Admitting: Cardiology

## 2008-12-18 ENCOUNTER — Encounter: Payer: Self-pay | Admitting: Internal Medicine

## 2009-02-05 ENCOUNTER — Ambulatory Visit: Payer: Self-pay | Admitting: Oncology

## 2009-02-07 ENCOUNTER — Encounter: Payer: Self-pay | Admitting: Internal Medicine

## 2009-02-07 LAB — CBC WITH DIFFERENTIAL/PLATELET
BASO%: 0.4 % (ref 0.0–2.0)
Basophils Absolute: 0 10*3/uL (ref 0.0–0.1)
EOS%: 2.4 % (ref 0.0–7.0)
HGB: 11.4 g/dL — ABNORMAL LOW (ref 13.0–17.1)
MCH: 38.1 pg — ABNORMAL HIGH (ref 27.2–33.4)
RDW: 15.3 % — ABNORMAL HIGH (ref 11.0–14.6)
lymph#: 1.1 10*3/uL (ref 0.9–3.3)

## 2009-02-07 LAB — COMPREHENSIVE METABOLIC PANEL
ALT: 16 U/L (ref 0–53)
AST: 18 U/L (ref 0–37)
Albumin: 4.4 g/dL (ref 3.5–5.2)
Alkaline Phosphatase: 31 U/L — ABNORMAL LOW (ref 39–117)
Glucose, Bld: 156 mg/dL — ABNORMAL HIGH (ref 70–99)
Potassium: 3.9 mEq/L (ref 3.5–5.3)
Sodium: 139 mEq/L (ref 135–145)
Total Protein: 6.7 g/dL (ref 6.0–8.3)

## 2009-02-07 LAB — FERRITIN: Ferritin: 239 ng/mL (ref 22–322)

## 2009-02-07 LAB — FOLATE: Folate: >20 ng/mL

## 2009-03-04 ENCOUNTER — Telehealth (INDEPENDENT_AMBULATORY_CARE_PROVIDER_SITE_OTHER): Payer: Self-pay | Admitting: *Deleted

## 2009-03-04 ENCOUNTER — Ambulatory Visit: Payer: Self-pay | Admitting: Internal Medicine

## 2009-03-08 ENCOUNTER — Ambulatory Visit: Payer: Self-pay | Admitting: Oncology

## 2009-03-12 ENCOUNTER — Encounter: Payer: Self-pay | Admitting: Internal Medicine

## 2009-03-12 LAB — CBC WITH DIFFERENTIAL/PLATELET
BASO%: 0.4 % (ref 0.0–2.0)
Basophils Absolute: 0 10*3/uL (ref 0.0–0.1)
EOS%: 1.8 % (ref 0.0–7.0)
HGB: 12 g/dL — ABNORMAL LOW (ref 13.0–17.1)
MCH: 38.1 pg — ABNORMAL HIGH (ref 27.2–33.4)
MONO%: 6.3 % (ref 0.0–14.0)
RBC: 3.15 10*6/uL — ABNORMAL LOW (ref 4.20–5.82)
RDW: 15.2 % — ABNORMAL HIGH (ref 11.0–14.6)
lymph#: 1.4 10*3/uL (ref 0.9–3.3)

## 2009-03-12 LAB — COMPREHENSIVE METABOLIC PANEL
ALT: 24 U/L (ref 0–53)
AST: 21 U/L (ref 0–37)
Albumin: 4.2 g/dL (ref 3.5–5.2)
Alkaline Phosphatase: 32 U/L — ABNORMAL LOW (ref 39–117)
BUN: 13 mg/dL (ref 6–23)
Calcium: 9.9 mg/dL (ref 8.4–10.5)
Chloride: 101 mEq/L (ref 96–112)
Potassium: 4.4 mEq/L (ref 3.5–5.3)
Sodium: 141 mEq/L (ref 135–145)
Total Protein: 6.6 g/dL (ref 6.0–8.3)

## 2009-03-14 LAB — SPEP & IFE WITH QIG
Albumin ELP: 61.6 % (ref 55.8–66.1)
Alpha-1-Globulin: 5.2 % — ABNORMAL HIGH (ref 2.9–4.9)
Alpha-2-Globulin: 10.8 % (ref 7.1–11.8)
Beta 2: 2.5 % — ABNORMAL LOW (ref 3.2–6.5)
Beta Globulin: 5.4 % (ref 4.7–7.2)
Gamma Globulin: 14.5 % (ref 11.1–18.8)

## 2009-03-18 ENCOUNTER — Ambulatory Visit: Payer: Self-pay | Admitting: Internal Medicine

## 2009-04-12 ENCOUNTER — Inpatient Hospital Stay (HOSPITAL_COMMUNITY): Admission: EM | Admit: 2009-04-12 | Discharge: 2009-04-15 | Payer: Self-pay | Admitting: Emergency Medicine

## 2009-04-12 ENCOUNTER — Ambulatory Visit: Payer: Self-pay | Admitting: Internal Medicine

## 2009-04-23 ENCOUNTER — Ambulatory Visit: Payer: Self-pay | Admitting: Oncology

## 2009-04-24 ENCOUNTER — Ambulatory Visit: Payer: Self-pay | Admitting: Internal Medicine

## 2009-04-25 ENCOUNTER — Encounter: Payer: Self-pay | Admitting: Internal Medicine

## 2009-04-25 LAB — CONVERTED CEMR LAB
Eosinophils Relative: 2.7 % (ref 0.0–5.0)
HCT: 30.8 % — ABNORMAL LOW (ref 39.0–52.0)
Hemoglobin: 10.3 g/dL — ABNORMAL LOW (ref 13.0–17.0)
Lymphs Abs: 1.4 10*3/uL (ref 0.7–4.0)
MCV: 107.9 fL — ABNORMAL HIGH (ref 78.0–100.0)
Monocytes Absolute: 0.1 10*3/uL (ref 0.1–1.0)
Monocytes Relative: 4.4 % (ref 3.0–12.0)
Neutro Abs: 1.1 10*3/uL — ABNORMAL LOW (ref 1.4–7.7)
RDW: 14.7 % — ABNORMAL HIGH (ref 11.5–14.6)
WBC: 2.7 10*3/uL — ABNORMAL LOW (ref 4.5–10.5)

## 2009-04-25 LAB — COMPREHENSIVE METABOLIC PANEL
ALT: 20 U/L (ref 0–53)
Albumin: 4.4 g/dL (ref 3.5–5.2)
BUN: 12 mg/dL (ref 6–23)
CO2: 28 mEq/L (ref 19–32)
Calcium: 9.5 mg/dL (ref 8.4–10.5)
Chloride: 97 mEq/L (ref 96–112)
Creatinine, Ser: 0.96 mg/dL (ref 0.40–1.50)
Potassium: 4.1 mEq/L (ref 3.5–5.3)

## 2009-04-25 LAB — LACTATE DEHYDROGENASE: LDH: 165 U/L (ref 94–250)

## 2009-05-08 ENCOUNTER — Ambulatory Visit: Payer: Self-pay | Admitting: Internal Medicine

## 2009-05-13 ENCOUNTER — Ambulatory Visit (HOSPITAL_COMMUNITY): Admission: RE | Admit: 2009-05-13 | Discharge: 2009-05-13 | Payer: Self-pay | Admitting: Oncology

## 2009-05-13 ENCOUNTER — Ambulatory Visit: Payer: Self-pay | Admitting: Oncology

## 2009-05-16 ENCOUNTER — Encounter: Payer: Self-pay | Admitting: Internal Medicine

## 2009-05-21 ENCOUNTER — Ambulatory Visit: Payer: Self-pay | Admitting: Internal Medicine

## 2009-08-06 ENCOUNTER — Ambulatory Visit: Payer: Self-pay | Admitting: Internal Medicine

## 2009-08-09 ENCOUNTER — Telehealth (INDEPENDENT_AMBULATORY_CARE_PROVIDER_SITE_OTHER): Payer: Self-pay | Admitting: *Deleted

## 2009-08-09 LAB — CONVERTED CEMR LAB
Basophils Absolute: 0 10*3/uL (ref 0.0–0.1)
CO2: 33 meq/L — ABNORMAL HIGH (ref 19–32)
Calcium: 9.2 mg/dL (ref 8.4–10.5)
Creatinine, Ser: 0.9 mg/dL (ref 0.4–1.5)
Eosinophils Absolute: 0 10*3/uL (ref 0.0–0.7)
GFR calc non Af Amer: 88.58 mL/min (ref >60)
Glucose, Bld: 115 mg/dL — ABNORMAL HIGH (ref 70–99)
Lymphocytes Relative: 46 % (ref 12.0–46.0)
MCHC: 35.5 g/dL (ref 30.0–36.0)
Neutrophils Relative %: 46.1 % (ref 43.0–77.0)
Platelets: 103 10*3/uL — ABNORMAL LOW (ref 150.0–400.0)
Pro B Natriuretic peptide (BNP): 194.3 pg/mL — ABNORMAL HIGH (ref 0.0–100.0)
RDW: 16 % — ABNORMAL HIGH (ref 11.5–14.6)
TSH: 3.91 microintl units/mL (ref 0.35–5.50)

## 2009-08-14 ENCOUNTER — Encounter: Payer: Self-pay | Admitting: Adult Health

## 2009-08-19 ENCOUNTER — Ambulatory Visit: Payer: Self-pay | Admitting: Oncology

## 2009-08-21 ENCOUNTER — Ambulatory Visit: Payer: Self-pay | Admitting: Internal Medicine

## 2009-08-21 LAB — CBC WITH DIFFERENTIAL/PLATELET
Basophils Absolute: 0 10*3/uL (ref 0.0–0.1)
EOS%: 2.2 % (ref 0.0–7.0)
HCT: 31.3 % — ABNORMAL LOW (ref 38.4–49.9)
HGB: 11.1 g/dL — ABNORMAL LOW (ref 13.0–17.1)
LYMPH%: 46.5 % (ref 14.0–49.0)
MCH: 38.1 pg — ABNORMAL HIGH (ref 27.2–33.4)
MCV: 106.8 fL — ABNORMAL HIGH (ref 79.3–98.0)
MONO%: 4.7 % (ref 0.0–14.0)
NEUT%: 46.3 % (ref 39.0–75.0)
Platelets: 122 10*3/uL — ABNORMAL LOW (ref 140–400)

## 2009-08-21 LAB — COMPREHENSIVE METABOLIC PANEL
AST: 17 U/L (ref 0–37)
Alkaline Phosphatase: 27 U/L — ABNORMAL LOW (ref 39–117)
BUN: 19 mg/dL (ref 6–23)
Creatinine, Ser: 1.02 mg/dL (ref 0.40–1.50)
Glucose, Bld: 160 mg/dL — ABNORMAL HIGH (ref 70–99)

## 2009-09-02 ENCOUNTER — Telehealth (INDEPENDENT_AMBULATORY_CARE_PROVIDER_SITE_OTHER): Payer: Self-pay | Admitting: *Deleted

## 2009-10-29 ENCOUNTER — Ambulatory Visit: Payer: Self-pay | Admitting: Internal Medicine

## 2009-12-25 ENCOUNTER — Ambulatory Visit: Payer: Self-pay | Admitting: Internal Medicine

## 2010-02-11 ENCOUNTER — Ambulatory Visit: Payer: Self-pay | Admitting: Oncology

## 2010-02-13 ENCOUNTER — Encounter: Payer: Self-pay | Admitting: Internal Medicine

## 2010-02-13 LAB — CBC WITH DIFFERENTIAL/PLATELET
BASO%: 0.4 % (ref 0.0–2.0)
Basophils Absolute: 0 10*3/uL (ref 0.0–0.1)
EOS%: 2.1 % (ref 0.0–7.0)
Eosinophils Absolute: 0 10*3/uL (ref 0.0–0.5)
HCT: 28.9 % — ABNORMAL LOW (ref 38.4–49.9)
HGB: 10.1 g/dL — ABNORMAL LOW (ref 13.0–17.1)
LYMPH%: 50.8 % — ABNORMAL HIGH (ref 14.0–49.0)
MCH: 37 pg — ABNORMAL HIGH (ref 27.2–33.4)
MCHC: 34.9 g/dL (ref 32.0–36.0)
MCV: 106.3 fL — ABNORMAL HIGH (ref 79.3–98.0)
MONO#: 0.1 10*3/uL (ref 0.1–0.9)
MONO%: 4.9 % (ref 0.0–14.0)
NEUT#: 0.8 10*3/uL — ABNORMAL LOW (ref 1.5–6.5)
NEUT%: 41.8 % (ref 39.0–75.0)
Platelets: 115 10*3/uL — ABNORMAL LOW (ref 140–400)
RBC: 2.72 10*6/uL — ABNORMAL LOW (ref 4.20–5.82)
RDW: 16 % — ABNORMAL HIGH (ref 11.0–14.6)
WBC: 1.9 10*3/uL — ABNORMAL LOW (ref 4.0–10.3)
lymph#: 0.9 10*3/uL (ref 0.9–3.3)

## 2010-02-17 LAB — COMPREHENSIVE METABOLIC PANEL
ALT: 15 U/L (ref 0–53)
AST: 16 U/L (ref 0–37)
Albumin: 4.4 g/dL (ref 3.5–5.2)
Alkaline Phosphatase: 31 U/L — ABNORMAL LOW (ref 39–117)
BUN: 18 mg/dL (ref 6–23)
CO2: 28 mEq/L (ref 19–32)
Calcium: 9.4 mg/dL (ref 8.4–10.5)
Chloride: 98 mEq/L (ref 96–112)
Creatinine, Ser: 0.96 mg/dL (ref 0.40–1.50)
Glucose, Bld: 183 mg/dL — ABNORMAL HIGH (ref 70–99)
Potassium: 4 mEq/L (ref 3.5–5.3)
Sodium: 136 mEq/L (ref 135–145)
Total Bilirubin: 0.5 mg/dL (ref 0.3–1.2)
Total Protein: 6.7 g/dL (ref 6.0–8.3)

## 2010-02-17 LAB — KAPPA/LAMBDA LIGHT CHAINS
Kappa free light chain: <0.66 mg/dL (ref 0.33–1.94)
Lambda Free Lght Chn: 2.73 mg/dL — ABNORMAL HIGH (ref 0.57–2.63)

## 2010-02-17 LAB — SPEP & IFE WITH QIG
Albumin ELP: 63 % (ref 55.8–66.1)
Alpha-2-Globulin: 9.8 % (ref 7.1–11.8)
IgA: 72 mg/dL (ref 68–378)
IgG (Immunoglobin G), Serum: 1290 mg/dL (ref 694–1618)

## 2010-02-17 LAB — LACTATE DEHYDROGENASE: LDH: 167 U/L (ref 94–250)

## 2010-02-17 LAB — VITAMIN B12: Vitamin B-12: 215 pg/mL (ref 211–911)

## 2010-02-17 LAB — FOLATE: Folate: >20 ng/mL

## 2010-02-23 LAB — CONVERTED CEMR LAB
AST: 22 units/L (ref 0–37)
Albumin: 4.3 g/dL (ref 3.5–5.2)
Alkaline Phosphatase: 32 units/L — ABNORMAL LOW (ref 39–117)
BUN: 17 mg/dL (ref 6–23)
Basophils Absolute: 0 10*3/uL (ref 0.0–0.1)
Bilirubin Urine: NEGATIVE
Chloride: 103 meq/L (ref 96–112)
Cholesterol: 142 mg/dL (ref 0–200)
Crystals: NEGATIVE
Eosinophils Absolute: 0.1 10*3/uL (ref 0.0–0.7)
Eosinophils Relative: 3.1 % (ref 0.0–5.0)
GFR calc Af Amer: 106 mL/min
GFR calc non Af Amer: 88 mL/min
HCT: 39.1 % (ref 39.0–52.0)
HDL: 28.6 mg/dL — ABNORMAL LOW (ref >39.0)
LDL Cholesterol: 77 mg/dL (ref 0–99)
MCV: 103.2 fL — ABNORMAL HIGH (ref 78.0–100.0)
Monocytes Absolute: 0.2 10*3/uL (ref 0.1–1.0)
Neutrophils Relative %: 48 % (ref 43.0–77.0)
Platelets: 163 10*3/uL (ref 150–400)
Potassium: 4.3 meq/L (ref 3.5–5.1)
RDW: 13.6 % (ref 11.5–14.6)
Sodium: 141 meq/L (ref 135–145)
Total Bilirubin: 1.1 mg/dL (ref 0.3–1.2)
Urine Glucose: NEGATIVE mg/dL
Urobilinogen, UA: 0.2 (ref 0.0–1.0)
VLDL: 37 mg/dL (ref 0–40)
WBC: 2.4 10*3/uL — ABNORMAL LOW (ref 4.5–10.5)

## 2010-02-27 NOTE — Assessment & Plan Note (Signed)
Summary: Pulmonary/ ext summary post hosp f/u ov   Primary Provider/Referring Provider:  Dr.  [name]  CC:  2 wk followup.  Pt states that chest congestion is improving as well as his breathing.  He states that he still feels very fatigued.  [name]  History of Present Illness: 77  yowm with morbid obesity with boderline DM and  tendency to peripheral edema that is felt to be dependent.   December 07, 2007--c/o  left groin pain and swelling. . Seen at ER 12/04/2007, dx with left inguinal hernia, refered to [facility] with negative eval for hernia subsequently seen by [facility] who felt a disc and rec PT at [place] made worse but his understanding nothing else to offer.  February 15, 2008--ov  follow up and med reivew. 1. Miralax is helping along w/ stool softner with constipation. 2. Back pain is better, seen by [facility] receiving pain shot in back, no office notes avail. 3. Meds are correct w/ med ist.   May 01, 2008 ov better but confused again re Med reconciliation, seeing Patsi Sears for urinary hesitance.     Jun 11, 2008 --returns for follow up and med review. Had vitamin D level at URology office 05/04/08 - improved from 29 to 44. Still has joint pain but some improvment w/ meds (Sulidac and Tylenol ). Meds are correct w/ med list.   October 02, 2008 ov new c/o nasal congestion and ha x 3 weeks after stopped nasonex not really following med calendar though trying,    November 08, 2008--Presents for follow up and med review. Was admitted 2 weeks ago, for syncopal episodes Passed out after mowing grass, felt dizzy, went to sit down and collapsed. Work up [employer]. CT head w/ no acute changes. He has been set up with cards for evaulation and carotid doppler. Meds are correct w/ list.   December 03, 2008 ov with recurrent syncope since last ov occurred in recliner [facility] meds then pacemaker Nov 16 and no spells since  March 04, 2009 3 month follow up.  c/o chest  congestion, increased SOB with activity and at rest, wheezing,  chest tightness, nonproductive cough, and headache x1 wk. in terms of cough and wheeze  proaire helps, used on day of ov x 2 puffs,  no purulent sputum. not using prns very effectively.    April 24, 2009--Returns for post hospital follow up. Admitted 04/11/2009- 04/15/2009 for CAP and leukopenia. CX were neg. CXR showed LLL infiltrate. He was tx w/ IV abx and aggressive pulmonary hygiene. He is doing much better w/ decreased dyspnea/cough. Has finished antibiotics. Is using flutter valve few times a day. Denies chest pain,  orthopnea, hemoptysis, fever, n/v/d, edema, headache. His wbc did trend down during admission wbc 4.3>>1.9. Dr Clelia Croft following.  May 08, 2009 2 wk followup.  Pt states that chest congestion is improving as well as his breathing.  He states that he still feels very fatigued, still needing proaire up to 4 x daily whereas as previously only 4 x weekly.  no purulent sputum.  Pt denies any significant sore throat, dysphagia, itching, sneezing,  nasal congestion or excess secretions,  fever, chills, sweats, unintended wt loss, pleuritic or exertional cp, hempoptysis, change in activity tolerance  orthopnea pnd or leg swelling Pt also denies any obvious fluctuation in symptoms with weather or environmental change or other alleviating or aggravating factors.       Current Medications (verified): 1)  Kay Ciel 20 Meq  Pack (Potassium Chloride) .[name].. 1 Daily 2)  Furosemide 20 Mg  Tabs (Furosemide) .[name].. 1 Once Daily 3)  Lexapro 20 Mg Tabs (Escitalopram Oxalate) .... Once Daily 4)  Trazodone Hcl 150 Mg  Tabs (Trazodone Hcl) .... 1/3 Tablet By Mouth At Bedtime 5)  Citrucel   Powd (Methylcellulose (Laxative)) .[name].. 1 Scoop Daily 6)  Multivitamins   Tabs (Multiple Vitamin) .[name].. 1 Once Daily 7)  Adult Aspirin Ec Low Strength 81 Mg  Tbec (Aspirin) .[name].. 1 Once Daily 8)  Miralax  Powd (Polyethylene Glycol 3350) .[name].. 1 Capful At  Bedtime 9)  Omeprazole 20 Mg Tbec (Omeprazole) .... Take 1 Tablet By Mouth 30 Minutes Before First Meal 10)  Os-Cal 500 + D 500-200 Mg-Unit Tabs (Calcium Carbonate-Vitamin D) .[name].. 1 Two Times A Day 11)  Vitamin D 1000 Unit Tabs (Cholecalciferol) .[name].. 1 Two Times A Day 12)  Finasteride 5 Mg Tabs (Finasteride) .... Take 1 Tablet By Mouth Once A Day 13)  Nasonex 50 Mcg/act Susp (Mometasone Furoate) .... Use 1 -2 Puffs Two Times A Day 14)  Robitussin Dm 100-10 Mg/14ml  Syrp (Dextromethorphan-Guaifenesin) .... As Directed On Bottle 15)  Tylenol Extra Strength 500 Mg  Tabs (Acetaminophen) .[name].. 1-2 Every 4-6 Hrs As Needed 16)  Cvs Stool Softener 100 Mg Caps (Docusate Sodium) .... As Directed 17)  Advil Cold & Sinus Liqui-Gels 30-200 Mg  Caps (Pseudoephedrine-Ibuprofen) .... As Directed 18)  Sulindac 200 Mg Tabs (Sulindac) .... One Twice Daily With Meals As Needed For Arthritis 19)  Afrin Nasal Spray 0.05 % Soln (Oxymetazoline Hcl) .... 2 Puffs Every 12 Hrs. X 5 Days and Stop 20)  Proair Hfa 108 (90 Base) Mcg/act Aers (Albuterol Sulfate) .[name].. 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.............................Marland KitchenWert   -  Td 10/07   -  Pneumovax 10/07 second shot    -   CPX  11/08/07    --colon 2/07 -int/extern. hems (repeat q7y) Complex med regimen --Meds reviewed with pt education and computerized med calendar completed/adjusted. November 08, 2008  Syncope...........................................................................................................Marland KitchenHochrein    -   Mobitz II AV block s/p Medtronic pacemaker placement 12/11/2008  Vital Signs:  Patient profile:   77 year old male Weight:      253 pounds O2 Sat:      97 % on Room air Temp:     97.9 degrees F oral Pulse rate:   79 / minute BP sitting:   118 / 74  (left arm) Cuff size:   large  Vitals Entered ByVernie Murders (May 08, 2009 10:30 AM)  O2 Flow:  Room air  Physical Exam  Additional Exam:  wt 244  January 16, 2008 > 245 December 03, 2008 > 247 March 04, 2009 >>249 April 24, 2009 > 253 May 08, 2009  Ambulatory obese wm  in no acute distress. Afeb with normal vital signs HEENT: nl dentition, turbinates, and orophanx.  Nl external ear canals without cough reflex Neck without JVD/Nodes/TM Lungs trace exp wheeze without cough on insp or exp maneuvers RRR no s3 or murmur or increase in P2, no sign edema Abd soft and benign with nl excursion in the supine position. Ext warm without calf tenderness, cyanosis clubbing  mild/mod chronic venous insufficiency changes     CXR  Procedure date:  04/24/2009  Findings:      Comparison: 04/14/2009, 12/12/2008 and 11/08/2007   Findings: Trachea is midline.  Heart is enlarged.  Retrocardiac opacity is unchanged over several prior exams.  There is right basilar scarring.  Biapical pleural thickening.  Lungs are otherwise clear.  Hiatal hernia.  Pacemaker lead tips project over the right atrium and right ventricle.   IMPRESSION: Bibasilar scarring, left greater than right.     Impression & Recommendations:  Problem # 1:  COUGH (ICD-786.2)  Probably element of cough variant asthma based on need now for saba.  See  instructions for specific recommendations   I discussed the importance of understanding the goals of asthma therapy including the rule of 2's as it applies to the use of a rescue inhaler.    Each maintenance medication was reviewed in detail including most importantly the difference between maintenance and as needed and under what circumstances the prns are to be used. This was done in the context of a medication calendar review which provided the patient with a user-friendly unambiguous mechanism for medication administration and reconciliation and provides an action plan for all active problems. It is critical that this be shown to every doctor  for modification during the office visit if necessary so the patient can use it as a working document.   Problem # 2:  PNEUMONIA (ICD-486)  Orders: Est. Patient Level IV (56213)  cxr reviewed, resolved  Problem # 3:  LEUKOCYTOPENIA UNSPECIFIED (ICD-288.50) likely secondary to previous lymphoma, chemo.  wbc trending up now. f/u with [facility] planned  Medications Added to Medication List This Visit: 1)  Prednisone 10 Mg Tabs (Prednisone) .... 4 each am x 2days, 2x2days, 1x2days and stop  Other Orders: Prescription Created Electronically [phone])  Patient Instructions: 1)  Prednisone 10 mg 4 each am x 2days, 2x2days, 1x2days and stop  2)  See calendar for specific medication instructions and bring it back for each and every office visit for every healthcare provider you see.  Without it,  you may not receive the best quality medical care that we feel you deserve.  3)  Return to office in 3 months, sooner if needed  Prescriptions: PREDNISONE 10 MG  TABS (PREDNISONE) 4 each am x 2days, 2x2days, 1x2days and stop  #14 x 0   Entered and Authorized by:   Nyoka Cowden MD   Signed by:   Nyoka Cowden MD on 05/08/2009   Method used:   [facility]        [facility]  [place] [address]       [address] [place] [address]       [place], [place]  [address]        Ph: [phone] or [phone]       Fax: [phone]   RxID:   [id number]

## 2010-02-27 NOTE — Assessment & Plan Note (Signed)
Summary: Primary svc/ cough eval and rx with pred x 6 days only   Primary Provider/Referring Provider:  Dr.  [name]  CC:  3 month follow up.  c/o chest congestion, increased SOB with activity and at rest, wheezing, chest tightness, nonproductive cough, and and headache x1 wk. denies fever.[name]  History of Present Illness: 77  yowm with morbid obesity with boderline DM and  tendency to peripheral edema that is felt to be dependent.   December 07, 2007--c/o  left groin pain and swelling. . Seen at ER 12/04/2007, dx with left inguinal hernia, refered to [facility] with negative eval for hernia subsequently seen by [facility] who felt a disc and rec PT at [place] made worse but his understanding nothing else to offer.  February 15, 2008--ov  follow up and med reivew. 1. Miralax is helping along w/ stool softner with constipation. 2. Back pain is better, seen by [facility] receiving pain shot in back, no office notes avail. 3. Meds are correct w/ med ist.   May 01, 2008 ov better but confused again re Med reconciliation, seeing Patsi Sears for urinary hesitance.     Jun 11, 2008 --returns for follow up and med review. Had vitamin D level at URology office 05/04/08 - improved from 29 to 44. Still has joint pain but some improvment w/ meds (Sulidac and Tylenol ). Meds are correct w/ med list.   October 02, 2008 ov new c/o nasal congestion and ha x 3 weeks after stopped nasonex not really following med calendar though trying.    November 08, 2008--Presents for follow up and med review. Was admitted 2 weeks ago, for syncopal episodes Passed out after mowing grass, felt dizzy, went to sit down and collapsed. Work up [employer]. CT head w/ no acute changes. He has been set up with cards for evaulation and carotid doppler. Meds are correct w/ list.   December 03, 2008 ov with recurrent syncope since last ov occurred in [facility]iner [facility]s then pacemaker Nov 16 and no spells  since  March 04, 2009 3 month follow up.  c/o chest congestion, increased SOB with activity and at rest, wheezing,  chest tightness, nonproductive cough, and headache x1 wk. in terms of cough and wheeze  proaire helps, used on day of ov x 2 puffs,  no purulent sputum. not using prns very effectively.  Pt denies any significant sore throat, dysphagia, itching, sneezing,  nasal congestion or excess secretions,  fever, chills, sweats, unintended wt loss, pleuritic or exertional cp, hempoptysis, change in activity tolerance  orthopnea pnd or leg swelling. Pt also denies any obvious fluctuation in symptoms with weather or environmental change or other alleviating or aggravating factors.       Current Medications (verified): 1)  Kay Ciel 20 Meq  Pack (Potassium Chloride) .[name].. 1 Daily 2)  Furosemide 20 Mg  Tabs (Furosemide) .[name].. 1 Once Daily 3)  Lexapro 10 Mg Tabs (Escitalopram Oxalate) .... Daily 4)  Trazodone Hcl 150 Mg  Tabs (Trazodone Hcl) .... 1/3 Tablet By Mouth At Bedtime 5)  Citrucel   Powd (Methylcellulose (Laxative)) .[name].. 1 Scoop Daily 6)  Multivitamins   Tabs (Multiple Vitamin) .[name].. 1 Once Daily 7)  Adult Aspirin Ec Low Strength 81 Mg  Tbec (Aspirin) .[name].. 1 Once Daily 8)  Miralax  Powd (Polyethylene Glycol 3350) .[name].. 1 Capful At Bedtime 9)  Omeprazole 20 Mg Tbec (Omeprazole) .... Take 1 Tablet By Mouth 30 Minutes Before  First Meal 10)  Os-Cal 500 + D 500-200 Mg-Unit Tabs (Calcium Carbonate-Vitamin D) .[name].. 1 Two Times A Day 11)  Vitamin D 1000 Unit Tabs (Cholecalciferol) .[name].. 1 Two Times A Day 12)  Finasteride 5 Mg Tabs (Finasteride) .... Take 1 Tablet By Mouth Once A Day 13)  Nasonex 50 Mcg/act Susp (Mometasone Furoate) .... Use 1 -2 Puffs Two Times A Day 14)  Robitussin Dm 100-10 Mg/66ml  Syrp (Dextromethorphan-Guaifenesin) .... As Directed On Bottle 15)  Tylenol Extra Strength 500 Mg  Tabs (Acetaminophen) .[name].. 1-2 Every 4-6 Hrs As Needed 16)  Cvs Stool Softener 100 Mg Caps (Docusate  Sodium) .... As Directed 17)  Advil Cold & Sinus Liqui-Gels 30-200 Mg  Caps (Pseudoephedrine-Ibuprofen) .... As Directed 18)  Sulindac 200 Mg Tabs (Sulindac) .... One Twice Daily With Meals As Needed For Arthritis 19)  Afrin Nasal Spray 0.05 % Soln (Oxymetazoline Hcl) .... 2 Puffs Every 12 Hrs. X 5 Days and Stop 20)  Proair Hfa 108 (90 Base) Mcg/act Aers (Albuterol Sulfate) .[name].. 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..........................Victor KitchenWert   -  Td 10/07   -  Pneumovax 10/07 second shot    -  CPX  11/08/07    --colon 2/07 -int/extern. hems (repeat q7y) Complex med regimen --Meds reviewed with pt education and computerized med calendar completed/adjusted. November 08, 2008  Syncope...........................................................................................................Victor KitchenHochrein    -   CHB s/p Medtronic pacemaker placement 12/11/2008  Vital Signs:  Patient profile:   77 year old male Height:      70 inches Weight:      247.50 pounds BMI:     35.64 O2 Sat:      96 % on Room air Temp:     98.0 degrees F oral Pulse rate:   103 / minute BP sitting:   138 / 80  (right arm) Cuff size:   regular  Vitals Entered By: Gweneth Dimitri  RN (March 04, 2009 11:42 AM)  O2 Flow:  Room air CC: 3 month follow up.  c/o chest congestion, increased SOB with activity and at rest, wheezing,  chest tightness, nonproductive cough, and headache x1 wk. denies fever. Comments Medications reviewed with patient Daytime contact number verified with patient. Gweneth Dimitri RN  March 04, 2009 11:42 AM    Physical Exam  Additional Exam:  wt 244  January 16, 2008 >250 Jun 11, 2008  > 249 November 08, 2008 > 245 December 03, 2008 > 247 March 04, 2009  Ambulatory obese wm  in no acute distress. Afeb with normal vital signs HEENT: nl dentition, turbinates, and orophanx. Nl external ear canals without cough reflex Neck without JVD/Nodes/TM Lungs clear to A and P bilaterally without cough on insp or exp maneuvers RRR no s3 or murmur or increase in P2, no sign edema Abd soft and benign with nl excursion in the supine position. Ext warm without calf tenderness, cyanosis clubbing  mild/mod chronic venous insufficiency changes     Impression & Recommendations:  Problem # 1:  COUGH (ICD-786.2)  The most common causes of chronic cough in immunocompetent adults include: upper airway cough syndrome (UACS), previously referred to as postnasal drip syndrome,  caused by variety of rhinosinus conditions; (2) asthma; (3) GERD; (4) chronic bronchitis from cigarette smoking or other inhaled environmental irritants; (5) nonasthmatic eosinophilic bronchitis; and (6) bronchiectasis. These conditions, singly or in combination, have accounted for up to 94% of the causes of chronic cough in prospective studies.  Believe this is either asthma or Upper airway cough syndrome, so named because it's frequently impossible to sort out how much is LPR vs CR/sinusitis with freq throat clearing generating secondary extra esophageal GERD from wide swings in gastric pressure that occur with throat clearing, promoting self use of mint and menthol lozenges that reduce the  lower esophageal sphincter tone and exacerbate the problem further.  These symptoms are easily confused with asthma/copd by even experienced pulmonogists because they overlap so much. These are the same pts who not infrequently have failed to tolerate ace inhibitors,  dry powder inhalers or biphosphonates or report having reflux symptoms that don't respond to standard doses of PPI   Each maintenance medication was reviewed in detail including most importantly the difference between maintenance and as needed and under what circumstances the prns are to be used. This was done in the context of a medication calendar review which provided the patient with a user-friendly unambiguous mechanism for medication administration and reconciliation and provides an action plan for all active problems. It is critical that this be shown to every doctor  for modification during the office visit if necessary so the patient can use it as a working document.   Orders: Est. Patient Level IV (81191)  Problem # 2:  RHINITIS, ALLERGIC NOS (ICD-477.9)  His updated medication list for this problem includes:    Nasonex 50 Mcg/act Susp (Mometasone furoate) ..... Use 1 -2 puffs two times a day    Afrin Nasal Spray 0.05 % Soln (Oxymetazoline hcl) .[name]... 2 puffs every 12 hrs. x 5 days and stop  I emphasized that nasal steroids have no immediate benefit in terms of improving symptoms.  To help them reached the target tissue, the patient should use Afrin two puffs every 12 hours applied one min before using the nasal steroids.  Afrin should be stopped after no more than 5 days.  If the symptoms worsen, Afrin can be restarted after 5 days off of therapy to prevent rebound congestion from overuse of Afrin.  I also emphasized that in no way are nasal steroids a concern in terms of "addiction".    Orders: Est. Patient Level IV (47829)  Medications Added to Medication List This Visit: 1)  Prednisone 10 Mg Tabs (Prednisone) .... 4  each am x 2days, 2x2days, 1x2days and stop  Patient Instructions: 1)  See calendar for specific medication instructions and bring it back for each and every office visit for every healthcare provider you see.  Without it,  you may not receive the best quality medical care that we feel you deserve.  2)  Prednsione 10 4 each am x 2days, 2x2days, 1x2days and stop  3)  Return to office in 3 months, sooner if needed  Prescriptions: PREDNISONE 10 MG  TABS (PREDNISONE) 4 each am x 2days, 2x2days, 1x2days and stop  #14 x 0   Entered and Authorized by:   Nyoka Cowden MD  Signed by:   Nyoka Cowden MD on 03/04/2009   Method used:   [facility]  [place] [address] [facility]       [address]       [place], [place]  [address]       Ph: [phone] or [phone]       Fax: [phone]   RxID:   [id number] NASONEX 50 MCG/ACT SUSP (MOMETASONE FUROATE) use 1 -2 puffs two times a day  #1 x 11   Entered and Authorized by:   Nyoka Cowden MD   Signed by:   Nyoka Cowden MD on 03/04/2009   Method used:   [facility]        [facility]  [place] [address]       [address]       [place], [place]  [address]       Ph: [phone] or [phone]       Fax: [phone]   RxID:   [id number]

## 2010-02-27 NOTE — Assessment & Plan Note (Signed)
Summary: Primary svc/ URI/ gerd   Primary Provider/Referring Provider:  Dr.  [name]  CC:  Followup.  Pt c/o cough x 5 days- prod with minimal green sputum.  Also he c/o sneezing.  He has noticed some wheezing with exertion and also has had slight increase in SOB since cough started.[name]  History of Present Illness: 77  yowm quit smoking 1970  with morbid obesity with boderline DM and  tendency to peripheral edema that is felt to be dependent.   December 07, 2007--c/o  left groin pain and swelling. . Seen at ER 12/04/2007, dx with left inguinal hernia, refered to [facility] with negative eval for hernia subsequently seen by [facility] who felt a disc and rec PT at [place] made worse but his understanding nothing else to offer.  February 15, 2008--ov  follow up and med reivew. 1. Miralax is helping along w/ stool softner with constipation. 2. Back pain is better, seen by [facility] receiving pain shot in back, no office notes avail. 3. Meds are correct w/ med ist.   May 01, 2008 ov better but confused again re Med reconciliation, seeing Patsi Sears for urinary hesitance.     Jun 11, 2008 --returns for follow up and med review. Had vitamin D level at URology office 05/04/08 - improved from 29 to 44. Still has joint pain but some improvment w/ meds (Sulidac and Tylenol ). Meds are correct w/ med list.   October 02, 2008 ov new c/o nasal congestion and ha x 3 weeks after stopped nasonex not really following med calendar though trying,    December 03, 2008 ov with recurrent syncope since last ov occurred in recliner admitted to [facility] resume all the same meds then pacemaker Nov 16 and no spells since  March 04, 2009 3 month follow up.  c/o chest congestion, increased SOB with activity and at rest, wheezing,  chest tightness, nonproductive cough, and headache x1 wk. in terms of cough and wheeze  proaire helps, used on day of ov x 2 puffs,  no purulent sputum. not using prns very effectively.    April 24, 2009--Returns for post hospital follow up. Admitted 04/11/2009- 04/15/2009 for CAP and leukopenia. CX were neg. CXR showed LLL infiltrate. He was tx w/ IV abx and aggressive pulmonary hygiene. He is doing much better w/ decreased dyspnea/cough. Has finished antibiotics. Is using flutter valve few times a day. Denies chest pain,  orthopnea, hemoptysis, fever, n/v/d, edema, headache. His wbc did trend down during admission wbc 4.3>>1.9. Dr Clelia Croft following.  May 08, 2009 2 wk followup.  Pt states that chest congestion is improving as well as his breathing.  He states that he still feels very fatigued, still needing proaire up to 4 x daily whereas as previously only 4 x weekly.  no purulent sputum.  rx prednsione x 6 days> resolved  May 21, 2009 Acute visit c/o rash on left arm and left side onset April 22   Pt states rash is very itchy and sometimes painful. rec lotrisone   August 06, 2009 cc  for the past month he is unable to walk at a brisk pace without getting out of breath.  He still has dry cough "nothing new".  very inactive no longer doing treadmill.  no problem at night or sitting still. nasal congestion but not using nasonex as per calendar.  Pt denies any significant sore throat, dysphagia, itching, sneezing, excess or purulent secretions,  fever, chills, sweats, unintended wt loss,  pleuritic or exertional cp, hempoptysis, change in activity tolerance  orthopnea pnd or leg swelling. rash no better, pruritic nodulesJuly 27, 2011--Presents for follow up and med review.  pt brought all meds with him today.  no new complaints. He is doing well. We reviewed his meds and organized them into med calendar. CXR last visit w/ no acute findings. Labs showed persistent pancytopenia. Has follow up w Dr. Frederich Chick > not due until Jan 2012  October 29, 2009 Followup.  Pt c/o cough x 5 days- prod with minimal green sputum.  Also he c/o sneezing.  He has noticed some wheezing with exertion and also has had slight  increase in SOB since cough started.  now needing saba whereas prev did not need it.  Pt denies any significant sore throat, dysphagia, itching,  nasal congestion or excess secretions,  fever, chills, sweats, unintended wt loss, pleuritic or exertional cp, hempoptysis, change in activity tolerance  orthopnea pnd or leg swelling.  .  Current Medications (verified): 1)  Kay Ciel 20 Meq  Pack (Potassium Chloride) .[name].. 1 Daily 2)  Furosemide 20 Mg  Tabs (Furosemide) .[name].. 1 Once Daily 3)  Lexapro 20 Mg Tabs (Escitalopram Oxalate) .... Take 1 Tab By Mouth At Bedtime 4)  Trazodone Hcl 150 Mg  Tabs (Trazodone Hcl) .... 1/3 Tablet By Mouth At Bedtime 5)  Citrucel   Powd (Methylcellulose (Laxative)) .[name].. 1 Scoop Daily 6)  Multivitamins   Tabs (Multiple Vitamin) .[name].. 1 Once Daily 7)  Adult Aspirin Ec Low Strength 81 Mg  Tbec (Aspirin) .[name].. 1 Once Daily 8)  Miralax  Powd (Polyethylene Glycol 3350) .[name].. 1 Capful At Bedtime 9)  Omeprazole 20 Mg Tbec (Omeprazole) .... Take 1 Tablet By Mouth 30 Minutes Before First Meal 10)  Os-Cal 500 + D 500-200 Mg-Unit Tabs (Calcium Carbonate-Vitamin D) .[name].. 1 Two Times A Day 11)  Vitamin D 1000 Unit Tabs (Cholecalciferol) .[name].. 1 Two Times A Day 12)  Finasteride 5 Mg Tabs (Finasteride) .... Take 1 Tablet By Mouth Once A Day 13)  Nasonex 50 Mcg/act Susp (Mometasone Furoate) .... Use 1 -2 Puffs Two Times A Day 14)  Pepcid 20 Mg Tabs (Famotidine) .... Take 1 Tab By Mouth At Bedtime 15)  Robitussin Dm 100-10 Mg/18ml  Syrp (Dextromethorphan-Guaifenesin) .... As Directed On Bottle 16)  Tylenol Extra Strength 500 Mg  Tabs (Acetaminophen) .[name].. 1-2 Every 4-6 Hrs As Needed 17)  Cvs Stool Softener 100 Mg Caps (Docusate Sodium) .... As Directed 18)  Advil Cold & Sinus Liqui-Gels 30-200 Mg  Caps (Pseudoephedrine-Ibuprofen) .... As Directed 19)  Sulindac 200 Mg Tabs (Sulindac) .... One Twice Daily With Meals As Needed For Arthritis 20)  Afrin Nasal Spray 0.05 % Soln (Oxymetazoline Hcl)  .... 2 Puffs Every 12 Hrs. X 5 Days and Stop 21)  Lotrisone 1-0.05 % Crea (Clotrimazole-Betamethasone) .... Apply Twice Daily As Needed 22)  Proair Hfa 108 (90 Base) Mcg/act Aers (Albuterol Sulfate) .[name].. 1-2 Puffs Every 4-6 Hours As Needed 23)  Cerave  Lotn (Emollient) .... 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.......[facility]    - last chemo 10/08     -repeat CT/ABD CT 11/18/07--no reoccurence    - Pancytopenia August 06, 2009 > refer back to [place] RHINITIS, ALLERGIC NOS (ICD-477.9) HYPERLIPIDEMIA NEC/NOS (ICD-272.4) EXOGENOUS OBESITY (ICD-278.00)      -  ideal body weight less than 186  EDEMA (ICD-782.3) Vitamin D Deficiency- level 29>>44 (4/9//10) Left Hip pain onset around 6/09............................................................................Marland KitchenHiltz     - MRI 11/23/07 c/w L1-2 bulging disc indents the thecal sac with foraminal stenosis [employer]INTENANCE..........................................................................................Marland KitchenWert   -  Td 10/07   -  Pneumovax 10/07 second shot    -  CPX  11/08/07    --colon 2/07 -int/extern. hems (repeat q7y) Complex med regimen --Meds reviewed with pt education and computerized med calendar completed/adjusted. November 08, 2008 , August 21, 2009 Syncope...........................................................................................................Marland KitchenHochrein    -   Mobitz II AV block s/p Medtronic pacemaker placement 12/11/2008 Dermatology.....................................................................................................[facility]    - Pruritic rash 04/2009 > tried lotrisone, referred to [facility] August 06, 2009   Vital Signs:  Patient profile:   77 year old male Weight:      265 pounds O2 Sat:      96 % on Room air Temp:      98.0 degrees F oral Pulse rate:   84 / minute BP sitting:   140 / 92  (left arm) Cuff size:   large  Vitals Entered By: Vernie Murders (October 29, 2009 2:10 PM)  O2 Flow:  Room air  Physical Exam  Additional Exam:  wt 244  January 16, 2008 > 249 April 24, 2009 >  258 May 21, 2009  > 265 October 29, 2009  Ambulatory obese wm  in no acute distress. Afeb with normal vital signs HEENT: nl dentition, turbinates, and orophanx. Nl external ear canals without cough reflex Neck without JVD/Nodes/TM Lungs CTA  RRR no s3 or murmur or increase in P2, no sign edema Abd soft and benign with nl excursion in the supine position. Ext warm without calf tenderness, cyanosis clubbing  mild/mod chronic venous insufficiency changes     Impression & Recommendations:  Problem # 1:  COUGH (ICD-786.2)  See instructions for specific recommendations for apparent uri  Orders: Est. Patient Level III ([address]) Prescription Created Electronically [phone])  Problem # 2:  GERD (ICD-530.81)  His updated medication list for this problem includes:    Omeprazole 20 Mg Tbec (Omeprazole) .[name]... Take 1 tablet by mouth 30 minutes before first meal    Pepcid 20 Mg Tabs (Famotidine) .[name]... Take 1 tab by mouth at bedtime   Explained natural h/o uri and why it's necessary in patients at risk to rx short term with PPI to reduce risk of evolving cyclical cough triggered by epithelial injury and a heightened sensitivty to the effects of any upper airway irritants,  most importantly acid - related    Orders: Est. Patient Level III ([address])  Medications Added to Medication List This Visit: 1)  Prednisone 10 Mg Tabs (Prednisone) .... 4 each am x 2days, 2x2days, 1x2days and stop 2)  Doxycycline Hyclate 100 Mg Caps (Doxycycline hyclate) .... One twice daily with large glass of water before eat  Patient Instructions: 1)  Doxycycline 100 mg twice daily x 7days 2)  Prednisone 4 each am x 2days, 2x2days, 1x2days and stop    3)  See calendar for specific medication instructions and bring it back for each and every office visit for every healthcare provider you see.  Without it,  you may not receive the best quality medical care that we feel you deserve. 4)  Work on inhaler technique:  relax and blow all the way out then take a nice smooth deep breath back in, triggering the inhaler at same time you start breathing in  5)  Return to office in 3  months, sooner if needed  Prescriptions: DOXYCYCLINE HYCLATE 100 MG CAPS (DOXYCYCLINE HYCLATE) one twice daily with large glass of water before eat  #14 x 0   Entered and Authorized by:   Nyoka Cowden MD   Signed by:   Nyoka Cowden MD on 10/29/2009   Method used:   Electronically to        [facility]  [place] [address] (retail)       [address]       [place], [place]  [address]       Ph: [phone] or [phone]       Fax: [phone]   RxID:   [id number] PREDNISONE 10 MG  TABS (PREDNISONE) 4 each am x 2days, 2x2days, 1x2days and stop  #14 x 0   Entered and Authorized by:   Nyoka Cowden MD   Signed by:   Nyoka Cowden MD on 10/29/2009   Method used:   [facility]ronically to        [facility]  [place] [address] (retail)       [address]       [place], [place]  [address]       Ph: [phone] or [phone]       Fax: [phone]   RxID:   [id number]

## 2010-02-27 NOTE — Progress Notes (Signed)
Summary: prescript  Phone Note From Pharmacy Call back at [phone]   Caller: Patient Caller: [facility]  [place] [address] Call For: wert  Summary of Call: pt calling about prescript for prenisone and nasal spray Initial call taken by: Rickard Patience,  March 04, 2009 3:41 PM  Follow-up for Phone Call        pharmacy found rx's that were sent from ov this am Follow-up by: Philipp Deputy CMA,  March 04, 2009 4:36 PM

## 2010-02-27 NOTE — Cardiovascular Report (Signed)
Summary: Office Visit   Office Visit   Imported By: Roderic Ovens 03/18/2009 16:29:02  _____________________________________________________________________  External Attachment:    Type:   Image     Comment:   External Document

## 2010-02-27 NOTE — Assessment & Plan Note (Signed)
Summary: NP follow up - post hosp   Primary Provider/Referring Provider:  Dr.  [name]  CC:  post hosp follow up - states breathing has improved since discharge.  pt finished abx.  still having cough and chest congestion and no production.[name]  History of Present Illness: 77  yowm with morbid obesity with boderline DM and  tendency to peripheral edema that is felt to be dependent.   December 07, 2007--c/o  left groin pain and swelling. . Seen at ER 12/04/2007, dx with left inguinal hernia, refered to [facility] with negative eval for hernia subsequently seen by [facility] who felt a disc and rec PT at [place] made worse but his understanding nothing else to offer.  February 15, 2008--ov  follow up and med reivew. 1. Miralax is helping along w/ stool softner with constipation. 2. Back pain is better, seen by [facility] receiving pain shot in back, no office notes avail. 3. Meds are correct w/ med ist.   May 01, 2008 ov better but confused again re Med reconciliation, seeing Patsi Sears for urinary hesitance.     Jun 11, 2008 --returns for follow up and med review. Had vitamin D level at URology office 05/04/08 - improved from 29 to 44. Still has joint pain but some improvment w/ meds (Sulidac and Tylenol ). Meds are correct w/ med list.   October 02, 2008 ov new c/o nasal congestion and ha x 3 weeks after stopped nasonex not really following med calendar though trying.    November 08, 2008--Presents for follow up and med review. Was admitted 2 weeks ago, for syncopal episodes Passed out after mowing grass, felt dizzy, went to sit down and collapsed. Work up [employer]. CT head w/ no acute changes. He has been set up with cards for evaulation and carotid doppler. Meds are correct w/ list.   December 03, 2008 ov with recurrent syncope since last ov occurred in recliner admitted to [facility] resume all the same meds then pacemaker Nov 16 and no spells since  March 04, 2009 3 month follow up.  c/o chest congestion,  increased SOB with activity and at rest, wheezing,  chest tightness, nonproductive cough, and headache x1 wk. in terms of cough and wheeze  proaire helps, used on day of ov x 2 puffs,  no purulent sputum. not using prns very effectively.    April 24, 2009--Returns for post hospital follow up. Admitted 04/11/2009- 04/15/2009 for CAP and leukopenia. CX were neg. CXR showed LLL infiltrate. He was tx w/ IV abx and aggressive pulmonary hygiene. He is doing much better w/ decreased dyspnea/cough. Has finished antibiotics. Is using flutter valve few times a day. Denies chest pain,  orthopnea, hemoptysis, fever, n/v/d, edema, headache. His wbc did trend down during admission wbc 4.3>>1.9. He has hx of  lymphoma and  pancytopenia.  He was followed closely by Hematology, Dr. Clelia Croft during admission w/ scheduled outpatient follow up.   Medications Prior to Update: 1)  Kay Ciel 20 Meq  Pack (Potassium Chloride) .[name].. 1 Daily 2)  Furosemide 20 Mg  Tabs (Furosemide) .[name].. 1 Once Daily 3)  Lexapro 20 Mg Tabs (Escitalopram Oxalate) .... Once Daily 4)  Trazodone Hcl 150 Mg  Tabs (Trazodone Hcl) .... 1/3 Tablet By Mouth At Bedtime 5)  Citrucel   Powd (Methylcellulose (Laxative)) .[name].. 1 Scoop Daily 6)  Multivitamins   Tabs (Multiple Vitamin) .[name].. 1 Once Daily 7)  Adult Aspirin Ec Low Strength 81 Mg  Tbec (Aspirin) .Marland KitchenMarland KitchenMarland Kitchen 1  Once Daily 8)  Miralax  Powd (Polyethylene Glycol 3350) .[name].. 1 Capful At Bedtime 9)  Omeprazole 20 Mg Tbec (Omeprazole) .... Take 1 Tablet By Mouth 30 Minutes Before First Meal 10)  Os-Cal 500 + D 500-200 Mg-Unit Tabs (Calcium Carbonate-Vitamin D) .[name].. 1 Two Times A Day 11)  Vitamin D 1000 Unit Tabs (Cholecalciferol) .[name].. 1 Two Times A Day 12)  Finasteride 5 Mg Tabs (Finasteride) .... Take 1 Tablet By Mouth Once A Day 13)  Nasonex 50 Mcg/act Susp (Mometasone Furoate) .... Use 1 -2 Puffs Two Times A Day 14)  Robitussin Dm 100-10 Mg/29ml  Syrp (Dextromethorphan-Guaifenesin) .... As Directed On Bottle 15)   Tylenol Extra Strength 500 Mg  Tabs (Acetaminophen) .[name].. 1-2 Every 4-6 Hrs As Needed 16)  Cvs Stool Softener 100 Mg Caps (Docusate Sodium) .... As Directed 17)  Advil Cold & Sinus Liqui-Gels 30-200 Mg  Caps (Pseudoephedrine-Ibuprofen) .... As Directed 18)  Sulindac 200 Mg Tabs (Sulindac) .... One Twice Daily With Meals As Needed For Arthritis 19)  Afrin Nasal Spray 0.05 % Soln (Oxymetazoline Hcl) .... 2 Puffs Every 12 Hrs. X 5 Days and Stop 20)  Proair Hfa 108 (90 Base) Mcg/act Aers (Albuterol Sulfate) .[name].. 1-2 Puffs Every 4-6 Hours As Needed  Current Medications (verified): 1)  Kay Ciel 20 Meq  Pack (Potassium Chloride) .[name].. 1 Daily 2)  Furosemide 20 Mg  Tabs (Furosemide) .[name].. 1 Once Daily 3)  Lexapro 20 Mg Tabs (Escitalopram Oxalate) .... Once Daily 4)  Trazodone Hcl 150 Mg  Tabs (Trazodone Hcl) .... 1/3 Tablet By Mouth At Bedtime 5)  Citrucel   Powd (Methylcellulose (Laxative)) .[name].. 1 Scoop Daily 6)  Multivitamins   Tabs (Multiple Vitamin) .[name].. 1 Once Daily 7)  Adult Aspirin Ec Low Strength 81 Mg  Tbec (Aspirin) .[name].. 1 Once Daily 8)  Miralax  Powd (Polyethylene Glycol 3350) .[name].. 1 Capful At Bedtime 9)  Omeprazole 20 Mg Tbec (Omeprazole) .... Take 1 Tablet By Mouth 30 Minutes Before First Meal 10)  Os-Cal 500 + D 500-200 Mg-Unit Tabs (Calcium Carbonate-Vitamin D) .[name].. 1 Two Times A Day 11)  Vitamin D 1000 Unit Tabs (Cholecalciferol) .[name].. 1 Two Times A Day 12)  Finasteride 5 Mg Tabs (Finasteride) .... Take 1 Tablet By Mouth Once A Day 13)  Nasonex 50 Mcg/act Susp (Mometasone Furoate) .... Use 1 -2 Puffs Two Times A Day 14)  Robitussin Dm 100-10 Mg/26ml  Syrp (Dextromethorphan-Guaifenesin) .... As Directed On Bottle 15)  Tylenol Extra Strength 500 Mg  Tabs (Acetaminophen) .[name].. 1-2 Every 4-6 Hrs As Needed 16)  Cvs Stool Softener 100 Mg Caps (Docusate Sodium) .... As Directed 17)  Advil Cold & Sinus Liqui-Gels 30-200 Mg  Caps (Pseudoephedrine-Ibuprofen) .... As Directed 18)  Sulindac 200 Mg  Tabs (Sulindac) .... One Twice Daily With Meals As Needed For Arthritis 19)  Afrin Nasal Spray 0.05 % Soln (Oxymetazoline Hcl) .... 2 Puffs Every 12 Hrs. X 5 Days and Stop 20)  Proair Hfa 108 (90 Base) Mcg/act Aers (Albuterol Sulfate) .[name].. 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.....................................Marland KitchenWert   -  Td 10/07   -  Pneumovax 10/07 second shot    -  CPX  11/08/07    --colon 2/07 -int/extern. hems (repeat q7y) Complex med regimen --Meds reviewed with pt education and computerized med calendar completed/adjusted. November 08, 2008  Syncope...........................................................................................................Marland KitchenHochrein    -   Mobitz II AV block s/p Medtronic pacemaker placement 12/11/2008  Past Surgical History: Last updated: 11/19/2008  He is status post lymph node biopsy in his   groins.  Median sternotomy and pericardiectomy in 2000.  He has   also had a colonoscopy and a penile prosthesis that was later removed.      Family History: Last updated: 06-12-08 mother deceased -hx of DM Father- deceased- accidental death.   Sister- died of ?cancer Brother- died of liver  cancer  Social History: Last updated: 04/24/2009  Widowed 2 kids live [place] quit smoking in 1970; x67yrs 1ppd No ETOH Retired  Risk Factors: Smoking Status: quit (05/25/2007)  Social History:  Widowed 2 kids live [place] quit smoking in 1970; x100yrs 1ppd No ETOH Retired  Review of Systems      See HPI  Vital Signs:  Patient profile:   77 year old male Height:      70 inches Weight:      249.38 pounds BMI:     35.91 O2 Sat:      97 % on Room air Temp:     97.7 degrees F oral Pulse rate:   81 / minute BP sitting:   122 / 82  (left arm) Cuff size:   regular  Vitals Entered By: Boone Master CNA (April 24, 2009 10:12 AM)  O2 Flow:  Room air CC: post hosp follow up - states breathing has improved since discharge.  pt finished abx.  still having cough and chest congestion, no production. Is Patient Diabetic? No Comments Medications reviewed with patient Daytime contact number verified with patient. Boone Master CNA  April 24, 2009 10:12 AM    Physical Exam  Additional Exam:  wt 244  January 16, 2008 >250 Jun 11, 2008  > 249 November 08, 2008 > 245 December 03, 2008 > 247 March 04, 2009 >>249 April 24, 2009 Ambulatory obese wm  in no acute distress. Afeb with normal vital signs HEENT: nl dentition, turbinates, and orophanx. Nl external ear canals without cough reflex Neck without JVD/Nodes/TM Lungs clear to A and P bilaterally without cough on insp or exp maneuvers RRR no s3 or murmur or increase in P2, no sign edema Abd soft and benign with nl excursion in the supine position. Ext warm without calf tenderness, cyanosis clubbing  mild/mod chronic venous insufficiency changes     Impression & Recommendations:  Problem # 1:  PNEUMONIA (ICD-486) Recent LLL CAP , clinically improving. Has finished all abx from hospitalization.  Will repeat xray today.  cont on same meds.  Please contact office for sooner follow up if symptoms do not improve or  worsen   Orders: Est. Patient Level IV (99214) T-2 View CXR (71020TC) TLB-CBC Platelet - w/Differential (85025-CBCD)  Problem # 2:  LEUKOCYTOPENIA UNSPECIFIED (ICD-288.50)  Hx of   lymphoma--he had  pancytopenia. during admission. WBC 1.7 at discharge Will check cbc today. he will need to follow up Dr. Clelia Croft as scheduled.  -this was set up prior to discharge.  Orders: Est. Patient Level IV (56213) TLB-CBC Platelet - w/Differential (85025-CBCD)  Complete Medication List: 1)  Kay Ciel 20 Meq Pack (Potassium chloride) .[name].. 1 daily 2)  Furosemide 20 Mg Tabs (Furosemide) .[name].. 1 once daily 3)  Lexapro 20 Mg Tabs (Escitalopram oxalate) .... Once daily 4)  Trazodone Hcl 150 Mg Tabs (Trazodone hcl) .... 1/3 tablet by mouth at bedtime 5)  Citrucel Powd (Methylcellulose (laxative)) .[name].. 1 scoop daily 6)  Multivitamins Tabs (Multiple vitamin) .[name].. 1 once daily 7)  Adult Aspirin Ec Low Strength 81 Mg Tbec (Aspirin) .[name].. 1 once daily 8)  Miralax Powd (Polyethylene glycol 3350) .[name].. 1 capful at bedtime 9)  Omeprazole 20 Mg Tbec (Omeprazole) .... Take 1 tablet by mouth 30 minutes before first meal 10)  Os-cal 500 + D 500-200 Mg-unit Tabs (Calcium carbonate-vitamin d) .[name].. 1 two times a day 11)  Vitamin D 1000 Unit Tabs (Cholecalciferol) .[name].. 1 two times a day 12)  Finasteride 5 Mg Tabs (Finasteride) .... Take 1 tablet by mouth once a day 13)  Nasonex 50 Mcg/act Susp (Mometasone furoate) .... Use 1 -2 puffs two times a day 14)  Robitussin Dm 100-10 Mg/102ml Syrp (Dextromethorphan-guaifenesin) .... As directed on bottle 15)  Tylenol Extra Strength 500 Mg Tabs (Acetaminophen) .[name].. 1-2 every 4-6 hrs as needed 16)  Cvs Stool Softener 100 Mg Caps (Docusate sodium) .... As directed 17)  Advil Cold & Sinus Liqui-gels 30-200 Mg Caps (Pseudoephedrine-ibuprofen) .... As directed 18)  Sulindac 200 Mg Tabs (Sulindac) .... One twice daily with meals as needed for arthritis 19)  Afrin Nasal Spray 0.05 % Soln  (Oxymetazoline hcl) .... 2 puffs every 12 hrs. x 5 days and stop 20)  Proair Hfa 108 (90 Base) Mcg/act Aers (Albuterol sulfate) .[name].. 1-2 puffs every 4-6 hours as needed  Patient Instructions: 1)  Continue on same meds.  2)  I will call with lab and xray results.  3)  follow up Dr. [name] 2 weeks    4)  Please contact office for sooner follow up if symptoms do not improve or worsen

## 2010-02-27 NOTE — Assessment & Plan Note (Signed)
Summary: pc2      Allergies Added: NKDA  Visit Type:  Follow-up Primary Provider:  Dr.  [name]   History of Present Illness: The patient presents today for routine electrophysiology followup. He reports doing very well since last being seen in our clinic. The patient denies symptoms of palpitations, chest pain, shortness of breath, orthopnea, PND, lower extremity edema, presyncope, syncope, or neurologic sequela.  He reports occasional orthostatic dizziness.  The patient is tolerating medications without difficulties and is otherwise without complaint today.   Current Medications (verified): 1)  Kay Ciel 20 Meq  Pack (Potassium Chloride) .[name].. 1 Daily 2)  Furosemide 20 Mg  Tabs (Furosemide) .[name].. 1 Once Daily 3)  Lexapro 20 Mg Tabs (Escitalopram Oxalate) .... Take 1 Tab By Mouth At Bedtime 4)  Trazodone Hcl 150 Mg  Tabs (Trazodone Hcl) .... 1/3 Tablet By Mouth At Bedtime 5)  Citrucel   Powd (Methylcellulose (Laxative)) .[name].. 1 Scoop Daily 6)  Multivitamins   Tabs (Multiple Vitamin) .[name].. 1 Once Daily 7)  Adult Aspirin Ec Low Strength 81 Mg  Tbec (Aspirin) .[name].. 1 Once Daily 8)  Miralax  Powd (Polyethylene Glycol 3350) .[name].. 1 Capful At Bedtime 9)  Omeprazole 20 Mg Tbec (Omeprazole) .... Take 1 Tablet By Mouth 30 Minutes Before First Meal 10)  Os-Cal 500 + D 500-200 Mg-Unit Tabs (Calcium Carbonate-Vitamin D) .[name].. 1 Two Times A Day 11)  Vitamin D 1000 Unit Tabs (Cholecalciferol) .[name].. 1 Two Times A Day 12)  Finasteride 5 Mg Tabs (Finasteride) .... Take 1 Tablet By Mouth Once A Day 13)  Nasonex 50 Mcg/act Susp (Mometasone Furoate) .... Use 1 -2 Puffs Two Times A Day 14)  Pepcid 20 Mg Tabs (Famotidine) .... Take 1 Tab By Mouth At Bedtime 15)  Robitussin Dm 100-10 Mg/78ml  Syrp (Dextromethorphan-Guaifenesin) .... As Directed On Bottle 16)  Tylenol Extra Strength 500 Mg  Tabs (Acetaminophen) .[name].. 1-2 Every 4-6 Hrs As Needed 17)  Cvs Stool Softener 100 Mg Caps (Docusate Sodium) .... As Directed 18)   Advil Cold & Sinus Liqui-Gels 30-200 Mg  Caps (Pseudoephedrine-Ibuprofen) .... As Directed 19)  Sulindac 200 Mg Tabs (Sulindac) .... One Twice Daily With Meals As Needed For Arthritis 20)  Afrin Nasal Spray 0.05 % Soln (Oxymetazoline Hcl) .... 2 Puffs Every 12 Hrs. X 5 Days and Stop 21)  Lotrisone 1-0.05 % Crea (Clotrimazole-Betamethasone) .... Apply Twice Daily As Needed 22)  Proair Hfa 108 (90 Base) Mcg/act Aers (Albuterol Sulfate) .[name].. 1-2 Puffs Every 4-6 Hours As Needed 23)  Cerave  Lotn (Emollient) .... Apply At Bedtime As Needed  Allergies (verified): No Known Drug Allergies  Past History:  Past Medical History: Reviewed history from 10/29/2009 and no changes required. 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) dx 04/2005.......Marland KitchenGranfortuna    - last chemo 10/08     -repeat CT/ABD CT 11/18/07--no reoccurence    - Pancytopenia August 06, 2009 > refer back to [place] RHINITIS, ALLERGIC NOS (ICD-477.9) HYPERLIPIDEMIA NEC/NOS (ICD-272.4) EXOGENOUS OBESITY (ICD-278.00)      -  ideal body weight less than 186 EDEMA (ICD-782.3) Vitamin D Deficiency- level 29>>44 (4/9//10) Left Hip pain onset around 6/09............................................................................Marland KitchenHiltz     - MRI 11/23/07 c/w L1-2 bulging disc indents the thecal sac with foraminal stenosis HEALTH MAINTENANCE..........................................................................................Marland KitchenWert   -  Td 10/07   -  Pneumovax 10/07 second shot    -  CPX  11/08/07    --colon 2/07 -int/extern. hems (repeat q7y) Complex med regimen --Meds reviewed with pt  education and computerized med calendar completed/adjusted. November 08, 2008 , August 21, 2009 Syncope...........................................................................................................Marland KitchenHochrein    -   Mobitz II AV block s/p Medtronic pacemaker placement  12/11/2008 Dermatology.....................................................................................................[facility]    - Pruritic rash 04/2009 > tried lotrisone, referred to [facility] August 06, 2009   Past Surgical History: Reviewed history from 11/19/2008 and no changes required.  He is status post lymph node biopsy in his   groins.  Median sternotomy and pericardiectomy in 2000.  He has   also had a colonoscopy and a penile prosthesis that was later removed.      Vital Signs:  Patient profile:   77 year old male Height:      70 inches Weight:      260 pounds BMI:     37.44 Pulse rate:   89 / minute BP sitting:   126 / 82  (left arm)  Vitals Entered By: Laurance Flatten CMA (December 25, 2009 2:57 PM)  Physical Exam  General:  Well developed, well nourished, in no acute distress. Head:  normocephalic and atraumatic Eyes:  PERRLA/EOM intact; conjunctiva and lids normal. Mouth:  Teeth, gums and palate normal. Oral mucosa normal. Neck:  Neck supple, no JVD. No masses, thyromegaly or abnormal cervical nodes. Chest Wall:  pacemaker site is well healed. Lungs:  Clear bilaterally to auscultation and percussion. Heart:  Non-displaced PMI, chest non-tender; regular rate and rhythm, S1, S2 without murmurs, rubs or gallops. Carotid upstroke normal, no bruit. Normal abdominal aortic size, no bruits. Femorals normal pulses, no bruits. Pedals normal pulses. No edema, no varicosities. Abdomen:  Bowel sounds positive; abdomen soft and non-tender without masses, organomegaly, or hernias noted. No hepatosplenomegaly. Msk:  Back normal, normal gait. Muscle strength and tone normal. Pulses:  normal pedal pulses bilaterally.   Extremities:  No clubbing or cyanosis. Neurologic:  Alert and oriented x 3.   PPM Specifications Following MD:  Hillis Range, MD     PPM Vendor:  Medtronic     PPM Model Number:  [id number]     PPM Serial Number:  [id number] PPM DOI:  12/11/2008     PPM Implanting  MD:  Hillis Range, MD  Lead 1    Location: RA     DOI: 12/11/2008     Model #: [id number]     Serial #: [id number]     Status: active Lead 2    Location: RV     DOI: 12/11/2008     Model #: [id number]     Serial #: [id number]     Status: active  Magnet Response Rate:  BOL 85 ERI  65  Indications:  CHB; Syncope   PPM Follow Up Battery Voltage:  2.79 V     Battery Est. Longevity:  13.5 yrs     Pacer Dependent:  No       PPM Device Measurements Atrium  Amplitude: 5.60 mV, Impedance: 440 ohms, Threshold: 1.00 V at 0.40 msec Right Ventricle  Amplitude: 8.00 mV, Impedance: 488 ohms, Threshold: 0.750 V at 0.40 msec  Episodes MS Episodes:  0     Coumadin:  No Ventricular High Rate:  0     Atrial Pacing:  0.2%     Ventricular Pacing:  0.9%  Parameters Mode:  MVP     Lower Rate Limit:  60     Upper Rate Limit:  130 Paced AV Delay:  150     Sensed AV Delay:  120 Next Cardiology Appt Due:  05/27/2010 Tech Comments:  NORMAL DEVICE FUNCTION.  NO EPISODES SINCE LAST CHECK.  CHANGED RA OUTPUT FROM 2.5 TO 2.00 AND RV OUTPUT FROM 2.00 TO 2.50 V. ROV IN 6 MTHS W/DEVICE CLINIC. Vella Kohler  December 25, 2009 3:25 PM MD Comments:  AGREE  Impression & Recommendations:  Problem # 1:  SYNCOPE, HX OF (ICD-V12.49) h/o mobitz II AV block and syncope, now doing well s/p PPM no changes today  Problem # 2:  ESSENTIAL HYPERTENSION, BENIGN (ICD-401.1) stable no changes  Patient Instructions: 1)  Your physician wants you to follow-up in: 6 months with device clinic  You will receive a reminder letter in the mail two months in advance. If you don't receive a letter, please call our office to schedule the follow-up appointment.

## 2010-02-27 NOTE — Progress Notes (Signed)
Summary: cxr results  Phone Note Call from Patient Call back at Home Phone [phone]   Caller: Patient Call For: wert Summary of Call: Wants results of cxr. Initial call taken by: Darletta Moll,  August 09, 2009 2:09 PM  Follow-up for Phone Call        Pt informed of CXR results and recs as stated by MW in append from 08/06/09.  Pt verbalized understanding.  Gweneth Dimitri RN  August 09, 2009 2:15 PM

## 2010-02-27 NOTE — Letter (Signed)
[facility]   Imported By: Sherian Rein 07/03/2009 14:38:10  _____________________________________________________________________  External Attachment:    Type:   Image     Comment:   External Document

## 2010-02-27 NOTE — Cardiovascular Report (Signed)
Summary: Office Visit   Office Visit   Imported By: Roderic Ovens 12/31/2009 10:43:07  _____________________________________________________________________  External Attachment:    Type:   Image     Comment:   External Document

## 2010-02-27 NOTE — Assessment & Plan Note (Signed)
Summary: Pulmonary/ ext f/u ov    Primary Provider/Referring Provider:  Dr.  [name]  CC:  3 month followup.  Pt states that for the past month he is unable to walk at a brisk pace without getting out of breath.  He still has dry cough "nothing new". .  History of Present Illness: 77  yowm quit smoking 1970  with morbid obesity with boderline DM and  tendency to peripheral edema that is felt to be dependent.   December 07, 2007--c/o  left groin pain and swelling. . Seen at ER 12/04/2007, dx with left inguinal hernia, refered to [facility] with negative eval for hernia subsequently seen by [facility] who felt a disc and rec PT at [place] made worse but his understanding nothing else to offer.  February 15, 2008--ov  follow up and med reivew. 1. Miralax is helping along w/ stool softner with constipation. 2. Back pain is better, seen by [facility] receiving pain shot in back, no office notes avail. 3. Meds are correct w/ med ist.   May 01, 2008 ov better but confused again re Med reconciliation, seeing Patsi Sears for urinary hesitance.     Jun 11, 2008 --returns for follow up and med review. Had vitamin D level at URology office 05/04/08 - improved from 29 to 44. Still has joint pain but some improvment w/ meds (Sulidac and Tylenol ). Meds are correct w/ med list.   October 02, 2008 ov new c/o nasal congestion and ha x 3 weeks after stopped nasonex not really following med calendar though trying,    December 03, 2008 ov with recurrent syncope since last ov occurred in recliner admitted to [facility] resume all the same meds then pacemaker Nov 16 and no spells since  March 04, 2009 3 month follow up.  c/o chest congestion, increased SOB with activity and at rest, wheezing,  chest tightness, nonproductive cough, and headache x1 wk. in terms of cough and wheeze  proaire helps, used on day of ov x 2 puffs,  no purulent sputum. not using prns very effectively.    April 24, 2009--Returns for post hospital follow up.  Admitted 04/11/2009- 04/15/2009 for CAP and leukopenia. CX were neg. CXR showed LLL infiltrate. He was tx w/ IV abx and aggressive pulmonary hygiene. He is doing much better w/ decreased dyspnea/cough. Has finished antibiotics. Is using flutter valve few times a day. Denies chest pain,  orthopnea, hemoptysis, fever, n/v/d, edema, headache. His wbc did trend down during admission wbc 4.3>>1.9. Dr Clelia Croft following.  May 08, 2009 2 wk followup.  Pt states that chest congestion is improving as well as his breathing.  He states that he still feels very fatigued, still needing proaire up to 4 x daily whereas as previously only 4 x weekly.  no purulent sputum.  rx prednsione x 6 days> resolved  May 21, 2009 Acute visit c/o rash on left arm and left side onset April 22   Pt states rash is very itchy and sometimes painful. rec lotrisone   August 06, 2009 cc  for the past month he is unable to walk at a brisk pace without getting out of breath.  He still has dry cough "nothing new".  very inactive no longer doing treadmill.  no problem at night or sitting still. nasal congestion but not using nasonex as per calendar.  Pt denies any significant sore throat, dysphagia, itching, sneezing, excess or purulent secretions,  fever, chills, sweats, unintended wt loss, pleuritic or  exertional cp, hempoptysis, change in activity tolerance  orthopnea pnd or leg swelling. rash no better, pruritic nodules  Current Medications (verified): 1)  Kay Ciel 20 Meq  Pack (Potassium Chloride) .[name].. 1 Daily 2)  Furosemide 20 Mg  Tabs (Furosemide) .[name].. 1 Once Daily 3)  Lexapro 20 Mg Tabs (Escitalopram Oxalate) .... Once Daily 4)  Trazodone Hcl 150 Mg  Tabs (Trazodone Hcl) .... 1/3 Tablet By Mouth At Bedtime 5)  Citrucel   Powd (Methylcellulose (Laxative)) .[name].. 1 Scoop Daily 6)  Multivitamins   Tabs (Multiple Vitamin) .[name].. 1 Once Daily 7)  Adult Aspirin Ec Low Strength 81 Mg  Tbec (Aspirin) .[name].. 1 Once Daily 8)  Miralax  Powd  (Polyethylene Glycol 3350) .[name].. 1 Capful At Bedtime 9)  Omeprazole 20 Mg Tbec (Omeprazole) .... Take 1 Tablet By Mouth 30 Minutes Before First Meal 10)  Os-Cal 500 + D 500-200 Mg-Unit Tabs (Calcium Carbonate-Vitamin D) .[name].. 1 Two Times A Day 11)  Vitamin D 1000 Unit Tabs (Cholecalciferol) .[name].. 1 Two Times A Day 12)  Finasteride 5 Mg Tabs (Finasteride) .... Take 1 Tablet By Mouth Once A Day 13)  Nasonex 50 Mcg/act Susp (Mometasone Furoate) .... Use 1 -2 Puffs Two Times A Day 14)  Robitussin Dm 100-10 Mg/54ml  Syrp (Dextromethorphan-Guaifenesin) .... As Directed On Bottle 15)  Tylenol Extra Strength 500 Mg  Tabs (Acetaminophen) .[name].. 1-2 Every 4-6 Hrs As Needed 16)  Cvs Stool Softener 100 Mg Caps (Docusate Sodium) .... As Directed 17)  Advil Cold & Sinus Liqui-Gels 30-200 Mg  Caps (Pseudoephedrine-Ibuprofen) .... As Directed 18)  Sulindac 200 Mg Tabs (Sulindac) .... One Twice Daily With Meals As Needed For Arthritis 19)  Afrin Nasal Spray 0.05 % Soln (Oxymetazoline Hcl) .... 2 Puffs Every 12 Hrs. X 5 Days and Stop 20)  Proair Hfa 108 (90 Base) Mcg/act Aers (Albuterol Sulfate) .[name].. 1-2 Puffs Every 4-6 Hours As Needed 21)  Lotrisone 1-0.05 % Crea (Clotrimazole-Betamethasone) .... 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.......[facility]    - last chemo 10/08     -repeat CT/ABD CT 11/18/07--no reoccurence    - Pancytopenia August 06, 2009 > refer back to [place] RHINITIS, ALLERGIC NOS (ICD-477.9) HYPERLIPIDEMIA NEC/NOS (ICD-272.4) EXOGENOUS OBESITY (ICD-278.00)      -  ideal body weight less than 186 EDEMA (ICD-782.3) Vitamin D Deficiency- level 29>>44 (4/9//10) Left Hip pain onset around 6/09...........................................................................Marland KitchenHiltz     - MRI 11/23/07 c/w L1-2 bulging disc indents the thecal  sac with foraminal stenosis HEALTH MAINTENANCE..........................................................................................Marland KitchenWert   -  Td 10/07   -  Pneumovax 10/07 second shot    -  CPX  11/08/07    --colon 2/07 -int/extern. hems (repeat q7y) Complex med regimen --Meds reviewed with pt education and computerized med calendar completed/adjusted. November 08, 2008  Syncope...........................................................................................................Marland KitchenHochrein    -   Mobitz II AV block s/p Medtronic pacemaker placement 12/11/2008 Dermatology.....................................................................................................[facility]    - Pruritic rash 04/2009 > tried lotrisone, referred to [facility] August 06, 2009   Vital Signs:  Patient profile:   77 year old male Weight:      252 pounds O2 Sat:      95 % on Room air Temp:     97.4 degrees F oral Pulse rate:   86 / minute BP sitting:   120 / 60  (left arm) Cuff size:   large  Vitals  Entered By: Vernie Murders (August 06, 2009 9:47 AM)  O2 Flow:  Room air Ambulatory Pulse Oximetry  Resting; HR____81_    02 Sat___94__  Lap1 (185 feet)   HR___112__   02 Sat__95___ Lap2 (185 feet)   HR___116__   02 Sat_94____    Lap3 (185 feet)   HR___124__   02 Sat_93____  __xTest Completed without Difficulty  .Kandice Hams [facility]  August 06, 2009 10:24 AM  ___Test Kem Parkinson due to:    Physical Exam  Additional Exam:  wt 244  January 16, 2008 >  247 March 04, 2009 >>249 April 24, 2009 > 253 May 08, 2009 > 258 May 21, 2009 > 252 August 06, 2009  Ambulatory obese wm  in no acute distress. Afeb with normal vital signs HEENT: nl dentition, turbinates, and orophanx. Nl external ear canals without cough reflex Neck without JVD/Nodes/TM Lungs trace exp wheeze without cough on insp or exp maneuvers RRR no s3 or murmur or increase in P2, no sign edema Abd soft and benign with nl excursion in the  supine position. Ext warm without calf tenderness, cyanosis clubbing  mild/mod chronic venous insufficiency changes Erythematous slt indurated rash isolated puntate lesions arms and abd.      Sodium                    142 mEq/L                   135-145   Potassium                 3.9 mEq/L                   3.5-5.1   Chloride                  106 mEq/L                   96-112   Carbon Dioxide       [H]  33 mEq/L                    19-32   Glucose              [H]  115 mg/dL                   16-10   BUN                       14 mg/dL                    9-60   Creatinine                0.9 mg/dL                   4.5-4.0   Calcium                   9.2 mg/dL                   9.8-11.9   GFR                       88.58 mL/min                >60  Tests: (2) CBC Platelet w/Diff (CBCD)   Order Note: Critical result called to megan on 08/06/2009 11:46 AM  by Luellen Pucker. Results were read back to caller.Rechecked and verified result.Results faxed to site/floor on 08/06/2009 11:46 AM by Luellen Pucker. (WBC)   White Cell Count     [LL] 1.9 cL K/uL                 4.5-10.5   Red Cell Count       [L]  2.84 Mil/uL                 4.22-5.81   Hemoglobin           [L]  10.9 g/dL                   16.1-09.6   Hematocrit           [L]  30.7 %                      39.0-52.0   MCV                  [H]  107.9 fl                    78.0-100.0   MCHC                      35.5 g/dL                   04.5-40.9   RDW                  [H]  16.0 %                      11.5-14.6   Platelet Count       [L]  103.0 K/uL                  150.0-400.0   Neutrophil %              46.1 %                      43.0-77.0   Lymphocyte %              46.0 %                      12.0-46.0   Monocyte %                5.2 %                       3.0-12.0   Eosinophils%              2.3 %                       0.0-5.0   Basophils %               0.4 %                       0.0-3.0   Neutrophill Absolute [L]  0.9 K/uL                     1.4-7.7   Lymphocyte Absolute       0.9 K/uL                    0.7-4.0  Monocyte Absolute         0.1 K/uL                    0.1-1.0  Eosinophils, Absolute                             0.0 K/uL                    0.0-0.7   Basophils Absolute        0.0 K/uL                    0.0-0.1  Tests: (3) TSH (TSH)   FastTSH                   3.91 uIU/mL                 0.35-5.50  Tests: (4) B-Type Natiuretic Peptide (BNPR)  B-Type Natriuetic Peptide                        [H]  194.3 pg/mL                 0.0-100.0  Impression & Recommendations:  Problem # 1:  DYSPNEA (ICD-786.09) Most likely obesity and deconditioning based on chronicity with anemia a concern as well  Problem # 2:  SKIN RASH (ICD-782.1)  His updated medication list for this problem includes:    Lotrisone 1-0.05 % Crea (Clotrimazole-betamethasone) .[name]... Apply twice daily as needed  Orders: Dermatology Referral (Derma)  Problem # 3:  COUGH (ICD-786.2) The most common causes of chronic cough in immunocompetent adults include: upper airway cough syndrome (UACS), previously referred to as postnasal drip syndrome,  caused by variety of rhinosinus conditions; (2) asthma; (3) GERD; (4) chronic bronchitis from cigarette smoking or other inhaled environmental irritants; (5) nonasthmatic eosinophilic bronchitis; and (6) bronchiectasis. These conditions, singly or in combination, have accounted for up to 94% of the causes of chronic cough in prospective studies.   most likely this is  Classic Upper airway cough syndrome, so named because it's frequently impossible to sort out how much is  CR/sinusitis with freq throat clearing (which can be related to primary GERD)   vs  causing  secondary extra esophageal GERD from wide swings in gastric pressure that occur with throat clearing, promoting self use of mint and menthol lozenges that reduce the lower esophageal sphincter tone and exacerbate the problem further These are the same  pts who not infrequently have failed to tolerate ace inhibitors,  dry powder inhalers or biphosphonates or report having reflux symptoms that don't respond to standard doses of PPI  For now add pepcid at bedtime, rx with diet and reinforce rx of problem #4   Problem # 4:  RHINITIS, ALLERGIC NOS (ICD-477.9)  His updated medication list for this problem includes:    Nasonex 50 Mcg/act Susp (Mometasone furoate) ..... Use 1 -2 puffs two times a day    Afrin Nasal Spray 0.05 % Soln (Oxymetazoline hcl) .[name]... 2 puffs every 12 hrs. x 5 days and stop  I emphasized that nasal steroids have no immediate benefit in terms of improving symptoms.  To help them reached the target tissue, the patient should use Afrin two puffs every 12 hours applied one min before using the nasal steroids.  Afrin should be stopped after no more than 5 days.  If  the symptoms worsen, Afrin can be restarted after 5 days off of therapy to prevent rebound congestion from overuse of Afrin.  I also emphasized that in no way are nasal steroids a concern in terms of "addiction".    Each maintenance medication was reviewed in detail including most importantly the difference between maintenance and as needed and under what circumstances the prns are to be used. This was done in the context of a medication calendar review which provided the patient with a user-friendly unambiguous mechanism for medication administration and reconciliation and provides an action plan for all active problems. It is critical that this be shown to every doctor  for modification during the office visit if necessary so the patient can use it as a working document.   Problem # 5:  LEUKOCYTOPENIA UNSPECIFIED (ICD-288.50) Pancytopenia appears to be getting worse, refer back to oncology  Other Orders: TLB-BMP (Basic Metabolic Panel-BMET) (80048-METABOL) TLB-CBC Platelet - w/Differential (85025-CBCD) TLB-TSH (Thyroid Stimulating Hormone) (84443-TSH) TLB-BNP  (B-Natriuretic Peptide) (83880-BNPR) T-2 View CXR (71020TC)  Patient Instructions: 1)  See Tammy NP w/in 2 weeks with all your medications, even over the counter meds, separated in two separate bags, the ones you take no matter what vs the ones you stop once you feel better and take only as needed.  She will generate for you a new user friendly medication calendar that will put [place] all on the same page re: your medication use.  2)  Add pepcid 20 mg one bedtime  3)  GERD (REFLUX)  is a common cause of respiratory symptoms. It commonly presents without heartburn and can be treated with medication, but also with lifestyle changes including avoidance of late meals, excessive alcohol, smoking cessation, and avoid fatty foods, chocolate, peppermint, colas, red wine, and acidic juices such as orange juice. NO MINT OR MENTHOL PRODUCTS SO NO COUGH DROPS  4)  USE SUGARLESS CANDY INSTEAD (jolley ranchers)  5)  NO OIL BASED VITAMINS

## 2010-02-27 NOTE — Letter (Signed)
[facility]   Imported By: Lennie Odor 02/28/2009 15:13:57  _____________________________________________________________________  External Attachment:    Type:   Image     Comment:   External Document

## 2010-02-27 NOTE — Assessment & Plan Note (Signed)
Summary: pc2  Medications Added LEXAPRO 20 MG TABS (ESCITALOPRAM OXALATE) once daily      Allergies Added: NKDA  Visit Type:  Pacemaker check Primary Provider:  Dr.  [name]  CC:  Pt states that he has felt hot and full in his chest cavity..  History of Present Illness: The patient presents today for routine electrophysiology followup. He reports doing very well since last being seen in our clinic. The patient denies symptoms of palpitations, chest pain, shortness of breath, orthopnea, PND, lower extremity edema, presyncope, syncope, or neurologic sequela.  He reports occasional orthostatic dizziness.  The patient is tolerating medications without difficulties and is otherwise without complaint today.   Current Medications (verified): 1)  Kay Ciel 20 Meq  Pack (Potassium Chloride) .[name].. 1 Daily 2)  Furosemide 20 Mg  Tabs (Furosemide) .[name].. 1 Once Daily 3)  Lexapro 20 Mg Tabs (Escitalopram Oxalate) .... Once Daily 4)  Trazodone Hcl 150 Mg  Tabs (Trazodone Hcl) .... 1/3 Tablet By Mouth At Bedtime 5)  Citrucel   Powd (Methylcellulose (Laxative)) .[name].. 1 Scoop Daily 6)  Multivitamins   Tabs (Multiple Vitamin) .[name].. 1 Once Daily 7)  Adult Aspirin Ec Low Strength 81 Mg  Tbec (Aspirin) .[name].. 1 Once Daily 8)  Miralax  Powd (Polyethylene Glycol 3350) .[name].. 1 Capful At Bedtime 9)  Omeprazole 20 Mg Tbec (Omeprazole) .... Take 1 Tablet By Mouth 30 Minutes Before First Meal 10)  Os-Cal 500 + D 500-200 Mg-Unit Tabs (Calcium Carbonate-Vitamin D) .[name].. 1 Two Times A Day 11)  Vitamin D 1000 Unit Tabs (Cholecalciferol) .[name].. 1 Two Times A Day 12)  Finasteride 5 Mg Tabs (Finasteride) .... Take 1 Tablet By Mouth Once A Day 13)  Nasonex 50 Mcg/act Susp (Mometasone Furoate) .... Use 1 -2 Puffs Two Times A Day 14)  Robitussin Dm 100-10 Mg/72ml  Syrp (Dextromethorphan-Guaifenesin) .... As Directed On Bottle 15)  Tylenol Extra Strength 500 Mg  Tabs (Acetaminophen) .[name].. 1-2 Every 4-6 Hrs As Needed 16)  Cvs Stool Softener  100 Mg Caps (Docusate Sodium) .... As Directed 17)  Advil Cold & Sinus Liqui-Gels 30-200 Mg  Caps (Pseudoephedrine-Ibuprofen) .... As Directed 18)  Sulindac 200 Mg Tabs (Sulindac) .... One Twice Daily With Meals As Needed For Arthritis 19)  Afrin Nasal Spray 0.05 % Soln (Oxymetazoline Hcl) .... 2 Puffs Every 12 Hrs. X 5 Days and Stop 20)  Proair Hfa 108 (90 Base) Mcg/act Aers (Albuterol Sulfate) .[name].. 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............................Marland KitchenWert   -  Td 10/07   -  Pneumovax 10/07 second shot    -  CPX  11/08/07    --colon 2/07 -int/extern. hems (repeat q7y) Complex med regimen --Meds reviewed with pt education and computerized med calendar completed/adjusted. November 08, 2008  Syncope...........................................................................................................Marland KitchenHochrein    -   Mobitz II AV block s/p Medtronic pacemaker placement 12/11/2008  Past Surgical History: Reviewed history from 11/19/2008 and no changes required.  He is status post lymph node biopsy in his   groins.  Median sternotomy and pericardiectomy in 2000.  He has   also had a colonoscopy and a penile prosthesis that was later removed.        Social History: Reviewed history from 06/11/2008 and no changes required.  Widowed 2 kids live [place] quit smoking in 1970 No ETOH Retired  Vital Signs:  Patient profile:   77 year old male Height:      70 inches Weight:      252 pounds BMI:     36.29 Pulse rate:   85 / minute BP sitting:   110 / 64  (left arm)  Vitals Entered By: Laurance Flatten CMA (March 18, 2009 10:05 AM)  Physical Exam  General:  Well developed, well nourished, in no acute distress. Head:  normocephalic and atraumatic Mouth:  Teeth, gums and palate normal. Oral mucosa normal. Neck:  Neck supple, no JVD. No masses, thyromegaly or abnormal cervical nodes. Chest Wall:  pacemaker site is well healed. Lungs:  Clear bilaterally to auscultation and percussion. Heart:  Non-displaced PMI, chest non-tender; regular rate and rhythm, S1, S2 without murmurs, rubs or gallops. Carotid upstroke normal, no bruit. Normal abdominal aortic size, no bruits. Femorals normal pulses, no bruits. Pedals normal pulses. No edema, no varicosities. Abdomen:  Bowel sounds positive; abdomen soft and non-tender without masses, organomegaly, or hernias noted. No hepatosplenomegaly. Msk:  Back normal, normal gait. Muscle strength and tone normal. Pulses:  normal pedal pulses bilaterally.   Extremities:  No clubbing or cyanosis. Neurologic:  Alert and oriented x 3. Skin:  Intact without lesions or rashes.   PPM Specifications Following MD:  Hillis Range, MD     PPM Vendor:  Medtronic     PPM Model Number:  [id number]     PPM Serial Number:  [id number] PPM DOI:  12/11/2008     PPM Implanting MD:  Hillis Range, MD  Lead 1    Location: RA     DOI: 12/11/2008     Model #: [id number]     Serial #: [id number]     Status: active Lead 2    Location: RV     DOI: 12/11/2008     Model #: [id number]     Serial #: [id number]     Status: active  Magnet Response Rate:  BOL 85 ERI  65  Indications:  CHB; Syncope   PPM Follow Up Remote Check?   No Battery Voltage:  2.79v V     Battery Est. Longevity:  9.5     Pacer Dependent:  No       PPM Device Measurements Atrium  Amplitude: 4.0 mV, Impedance: 447 ohms, Threshold: 1.0 V at 0.4 msec Right Ventricle  Amplitude: 8.0 mV, Impedance: 534 ohms, Threshold: 0.75 V at 0.4 msec  Episodes MS Episodes:  0     Percent Mode Switch:  0     Coumadin:  No Ventricular High Rate:  0     Atrial Pacing:  0     Ventricular Pacing:  1%  Parameters Mode:  AAI-DDD     Lower Rate Limit:  60     Upper Rate Limit:  130 Paced AV Delay:  150     Sensed AV Delay:  120 Tech Comments:  decreased outputs A-2.0v  V-2.5v MD Comments:  agree.  Impression & Recommendations:  Problem # 1:  SYNCOPE, HX OF (ICD-V12.49) The patient has a h/o syncope which was determined  to be due to Mobitz II AV block with intermittent advancement to complete heart block.  He has had no further syncope since his pacemaker was placed.  Normal pacemaker function today. Changes as above. Return 11/11.  Patient Instructions: 1)  Your physician recommends that you schedule a follow-up appointment in: Nov. 2011 with Dr Johney Frame

## 2010-02-27 NOTE — Assessment & Plan Note (Signed)
Summary: NP follow up - med calendar   Primary Provider/Referring Provider:  Dr.  [name]  CC:  est med calendar - pt brought all meds with him today.  no new complaints..  History of Present Illness: 77  yowm quit smoking 1970  with morbid obesity with boderline DM and  tendency to peripheral edema that is felt to be dependent.   December 07, 2007--c/o  left groin pain and swelling. . Seen at ER 12/04/2007, dx with left inguinal hernia, refered to [facility] with negative eval for hernia subsequently seen by [facility] who felt a disc and rec PT at [place] made worse but his understanding nothing else to offer.  February 15, 2008--ov  follow up and med reivew. 1. Miralax is helping along w/ stool softner with constipation. 2. Back pain is better, seen by [facility] receiving pain shot in back, no office notes avail. 3. Meds are correct w/ med ist.   May 01, 2008 ov better but confused again re Med reconciliation, seeing Patsi Sears for urinary hesitance.     Jun 11, 2008 --returns for follow up and med review. Had vitamin D level at URology office 05/04/08 - improved from 29 to 44. Still has joint pain but some improvment w/ meds (Sulidac and Tylenol ). Meds are correct w/ med list.   October 02, 2008 ov new c/o nasal congestion and ha x 3 weeks after stopped nasonex not really following med calendar though trying,    December 03, 2008 ov with recurrent syncope since last ov occurred in recliner admitted to [facility] resume all the same meds then pacemaker Nov 16 and no spells since  March 04, 2009 3 month follow up.  c/o chest congestion, increased SOB with activity and at rest, wheezing,  chest tightness, nonproductive cough, and headache x1 wk. in terms of cough and wheeze  proaire helps, used on day of ov x 2 puffs,  no purulent sputum. not using prns very effectively.    April 24, 2009--Returns for post hospital follow up. Admitted 04/11/2009- 04/15/2009 for CAP and leukopenia. CX were neg. CXR showed  LLL infiltrate. He was tx w/ IV abx and aggressive pulmonary hygiene. He is doing much better w/ decreased dyspnea/cough. Has finished antibiotics. Is using flutter valve few times a day. Denies chest pain,  orthopnea, hemoptysis, fever, n/v/d, edema, headache. His wbc did trend down during admission wbc 4.3>>1.9. Dr Clelia Croft following.  May 08, 2009 2 wk followup.  Pt states that chest congestion is improving as well as his breathing.  He states that he still feels very fatigued, still needing proaire up to 4 x daily whereas as previously only 4 x weekly.  no purulent sputum.  rx prednsione x 6 days> resolved  May 21, 2009 Acute visit c/o rash on left arm and left side onset April 22   Pt states rash is very itchy and sometimes painful. rec lotrisone   August 06, 2009 cc  for the past month he is unable to walk at a brisk pace without getting out of breath.  He still has dry cough "nothing new".  very inactive no longer doing treadmill.  no problem at night or sitting still. nasal congestion but not using nasonex as per calendar.  Pt denies any significant sore throat, dysphagia, itching, sneezing, excess or purulent secretions,  fever, chills, sweats, unintended wt loss, pleuritic or exertional cp, hempoptysis, change in activity tolerance  orthopnea pnd or leg swelling. rash no better, pruritic nodules  August 21, 2009--Presents for follow up and med review.  pt brought all meds with him today.  no new complaints. He is doing well. We reviewed his meds and organized them into med calendar. CXR last visit w/ no acute findings. Labs showed persistent pancytopenia. Has follow up w Dr. Frederich Chick. Denies chest pain, orthopnea, hemoptysis, fever, n/v/d, edema, headache  Medications Prior to Update: 1)  Kay Ciel 20 Meq  Pack (Potassium Chloride) .[name].. 1 Daily 2)  Furosemide 20 Mg  Tabs (Furosemide) .[name].. 1 Once Daily 3)  Lexapro 20 Mg Tabs (Escitalopram Oxalate) .... Once Daily 4)  Trazodone Hcl 150 Mg  Tabs  (Trazodone Hcl) .... 1/3 Tablet By Mouth At Bedtime 5)  Citrucel   Powd (Methylcellulose (Laxative)) .[name].. 1 Scoop Daily 6)  Multivitamins   Tabs (Multiple Vitamin) .[name].. 1 Once Daily 7)  Adult Aspirin Ec Low Strength 81 Mg  Tbec (Aspirin) .[name].. 1 Once Daily 8)  Miralax  Powd (Polyethylene Glycol 3350) .[name].. 1 Capful At Bedtime 9)  Omeprazole 20 Mg Tbec (Omeprazole) .... Take 1 Tablet By Mouth 30 Minutes Before First Meal 10)  Os-Cal 500 + D 500-200 Mg-Unit Tabs (Calcium Carbonate-Vitamin D) .[name].. 1 Two Times A Day 11)  Vitamin D 1000 Unit Tabs (Cholecalciferol) .[name].. 1 Two Times A Day 12)  Finasteride 5 Mg Tabs (Finasteride) .... Take 1 Tablet By Mouth Once A Day 13)  Nasonex 50 Mcg/act Susp (Mometasone Furoate) .... Use 1 -2 Puffs Two Times A Day 14)  Robitussin Dm 100-10 Mg/33ml  Syrp (Dextromethorphan-Guaifenesin) .... As Directed On Bottle 15)  Tylenol Extra Strength 500 Mg  Tabs (Acetaminophen) .[name].. 1-2 Every 4-6 Hrs As Needed 16)  Cvs Stool Softener 100 Mg Caps (Docusate Sodium) .... As Directed 17)  Advil Cold & Sinus Liqui-Gels 30-200 Mg  Caps (Pseudoephedrine-Ibuprofen) .... As Directed 18)  Sulindac 200 Mg Tabs (Sulindac) .... One Twice Daily With Meals As Needed For Arthritis 19)  Afrin Nasal Spray 0.05 % Soln (Oxymetazoline Hcl) .... 2 Puffs Every 12 Hrs. X 5 Days and Stop 20)  Lotrisone 1-0.05 % Crea (Clotrimazole-Betamethasone) .... Apply Twice Daily As Needed 21)  Proair Hfa 108 (90 Base) Mcg/act Aers (Albuterol Sulfate) .[name].. 1-2 Puffs Every 4-6 Hours As Needed  Current Medications (verified): 1)  Kay Ciel 20 Meq  Pack (Potassium Chloride) .[name].. 1 Daily 2)  Furosemide 20 Mg  Tabs (Furosemide) .[name].. 1 Once Daily 3)  Lexapro 20 Mg Tabs (Escitalopram Oxalate) .... Take 1 Tab By Mouth At Bedtime 4)  Trazodone Hcl 150 Mg  Tabs (Trazodone Hcl) .... 1/3 Tablet By Mouth At Bedtime 5)  Citrucel   Powd (Methylcellulose (Laxative)) .[name].. 1 Scoop Daily 6)  Multivitamins   Tabs (Multiple Vitamin)  .[name].. 1 Once Daily 7)  Adult Aspirin Ec Low Strength 81 Mg  Tbec (Aspirin) .[name].. 1 Once Daily 8)  Miralax  Powd (Polyethylene Glycol 3350) .[name].. 1 Capful At Bedtime 9)  Omeprazole 20 Mg Tbec (Omeprazole) .... Take 1 Tablet By Mouth 30 Minutes Before First Meal 10)  Os-Cal 500 + D 500-200 Mg-Unit Tabs (Calcium Carbonate-Vitamin D) .[name].. 1 Two Times A Day 11)  Vitamin D 1000 Unit Tabs (Cholecalciferol) .[name].. 1 Two Times A Day 12)  Finasteride 5 Mg Tabs (Finasteride) .... Take 1 Tablet By Mouth Once A Day 13)  Nasonex 50 Mcg/act Susp (Mometasone Furoate) .... Use 1 -2 Puffs Two Times A Day 14)  Pepcid 20 Mg Tabs (Famotidine) .... Take 1 Tab By Mouth At Bedtime 15)  Robitussin Dm 100-10 Mg/60ml  Syrp (Dextromethorphan-Guaifenesin) .... As Directed On Bottle 16)  Tylenol Extra Strength 500 Mg  Tabs (Acetaminophen) .[name].. 1-2 Every 4-6 Hrs As Needed 17)  Cvs Stool Softener 100 Mg Caps (Docusate Sodium) .... As Directed 18)  Advil Cold & Sinus Liqui-Gels 30-200 Mg  Caps (Pseudoephedrine-Ibuprofen) .... As Directed 19)  Sulindac 200 Mg Tabs (Sulindac) .... One Twice Daily With Meals As Needed For Arthritis 20)  Afrin Nasal Spray 0.05 % Soln (Oxymetazoline Hcl) .... 2 Puffs Every 12 Hrs. X 5 Days and Stop 21)  Lotrisone 1-0.05 % Crea (Clotrimazole-Betamethasone) .... Apply Twice Daily As Needed 22)  Proair Hfa 108 (90 Base) Mcg/act Aers (Albuterol Sulfate) .[name].. 1-2 Puffs Every 4-6 Hours As Needed 23)  Cerave  Lotn (Emollient) .... Apply At Bedtime As Needed  Allergies (verified): No Known Drug Allergies  Past History:  Past Surgical History: Last updated: 11/19/2008  He is status post lymph node biopsy in his   groins.  Median sternotomy and pericardiectomy in 2000.  He has   also had a colonoscopy and a penile prosthesis that was later removed.      Family History: Last updated: 2008-06-27 mother deceased -hx of DM Father- deceased- accidental death.   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) dx 04/[facility]....[facility]    - last chemo 10/08     -repeat CT/ABD CT 11/18/07--no reoccurence    - Pancytopenia August 06, 2009 > refer back to [facility] RHINITIS, ALLERGIC NOS (ICD-477.9) HYPERLIPIDEMIA NEC/NOS (ICD-272.4) EXOGENOUS OBESITY (ICD-278.00)      -  ideal body weight less than 186 EDEMA (ICD-782.3) Vitamin D Deficiency- level 29>>44 (4/9//10) Left Hip pain onset around 6/09...........................................................................Marland KitchenHiltz     - MRI 11/23/07 c/w L1-2 bulging disc indents the thecal sac with foraminal stenosis [facility] [employer]..........................................................................................Marland KitchenWert   -  Td 10/07   -  Pneumovax 10/07 second shot    -  CPX  11/08/07    --colon 2/07 -int/extern. hems (repeat q7y) Complex med regimen --Meds reviewed with pt education and computerized med calendar completed/adjusted. November 08, 2008 , August 21, 2009 Syncope...........................................................................................................Marland KitchenHochrein    -   Mobitz II AV block s/p Medtronic pacemaker placement 12/11/2008 Dermatology.....................................................................................................[facility]    - Pruritic rash 04/2009 > tried lotrisone, referred to [facility] August 06, 2009   Review of Systems      See HPI  Vital Signs:  Patient profile:   77 year old male Height:      70 inches Weight:      253.13 pounds BMI:     36.45 O2 Sat:      94 % on Room air Temp:     99.4 degrees F oral Pulse rate:   91 / minute BP sitting:   122 / 78  (left arm) Cuff size:    regular  Vitals Entered By: Boone Master CNA/MA (August 21, 2009 10:37 AM)  O2 Flow:  Room air CC: est med calendar - pt brought all meds with him today.  no new complaints. Is Patient Diabetic? No Comments Medications reviewed with patient Daytime contact number verified with patient. Boone Master CNA/MA  August 21, 2009 10:37 AM    Physical Exam  Additional Exam:  wt 244  January 16, 2008 >  247 March 04, 2009 >>249 April 24, 2009 > 253 May 08, 2009 > 258 May 21, 2009 > 252 August 06, 2009 >>253 August 21, 2009  Ambulatory obese wm  in no acute distress. Afeb with normal vital signs HEENT: nl dentition, turbinates, and orophanx. Nl external ear canals without cough reflex Neck without JVD/Nodes/TM Lungs CTA  RRR no s3 or murmur or increase in P2, no sign edema Abd soft and benign with nl excursion in the supine position. Ext warm without calf tenderness, cyanosis clubbing  mild/mod chronic venous insufficiency changes     Impression & Recommendations:  Problem # 1:  LEUKOCYTOPENIA UNSPECIFIED (ICD-288.50)  follow up Dr. Frederich Chick as scheduled.  Meds reviewed with pt education and computerized med calendar completed/adjusted.     Orders: Est. Patient Level III (71062)  Problem # 2:  GERD (ICD-530.81)  controlled on rx  His updated medication list for this problem includes:    Omeprazole 20 Mg Tbec (Omeprazole) .[name]... Take 1 tablet by mouth 30 minutes before first meal    Pepcid 20 Mg Tabs (Famotidine) .[name]... Take 1 tab by mouth at bedtime  Orders: Est. Patient Level III (69485)  Problem # 3:  URINARY OBSTRUCTION UNSPECIFIED (ICD-599.60)  cont on same meds.   Orders: Est. Patient Level III (46270)  Medications Added to Medication List This Visit: 1)  Lexapro 20 Mg Tabs (Escitalopram oxalate) .... Take 1 tab by mouth at bedtime 2)  Pepcid 20 Mg Tabs (Famotidine) .... Take 1 tab by mouth at bedtime 3)  Cerave Lotn (Emollient) .... Apply at bedtime as  needed  Complete Medication List: 1)  Kay Ciel 20 Meq Pack (Potassium chloride) .[name].. 1 daily 2)  Furosemide 20 Mg Tabs (Furosemide) .[name].. 1 once daily 3)  Lexapro 20 Mg Tabs (Escitalopram oxalate) .... Take 1 tab by mouth at bedtime 4)  Trazodone Hcl 150 Mg Tabs (Trazodone hcl) .... 1/3 tablet by mouth at bedtime 5)  Citrucel Powd (Methylcellulose (laxative)) .[name].. 1 scoop daily 6)  Multivitamins Tabs (Multiple vitamin) .[name].. 1 once daily 7)  Adult Aspirin Ec Low Strength 81 Mg Tbec (Aspirin) .[name].. 1 once daily 8)  Miralax Powd (Polyethylene glycol 3350) .[name].. 1 capful at bedtime 9)  Omeprazole 20 Mg Tbec (Omeprazole) .... Take 1 tablet by mouth 30 minutes before first meal 10)  Os-cal 500 + D 500-200 Mg-unit Tabs (Calcium carbonate-vitamin d) .[name].. 1 two times a day 11)  Vitamin D 1000 Unit Tabs (Cholecalciferol) .[name].. 1 two times a day 12)  Finasteride 5 Mg Tabs (Finasteride) .... Take 1 tablet by mouth once a day 13)  Nasonex 50 Mcg/act Susp (Mometasone furoate) .... Use 1 -2 puffs two times a day 14)  Pepcid 20 Mg Tabs (Famotidine) .... Take 1 tab by mouth at bedtime 15)  Robitussin Dm 100-10 Mg/25ml Syrp (Dextromethorphan-guaifenesin) .... As directed on bottle 16)  Tylenol Extra Strength 500 Mg Tabs (Acetaminophen) .[name].. 1-2 every 4-6 hrs as needed 17)  Cvs Stool Softener 100 Mg Caps (Docusate sodium) .... As directed 18)  Advil Cold & Sinus Liqui-gels 30-200 Mg Caps (Pseudoephedrine-ibuprofen) .... As directed 19)  Sulindac 200 Mg Tabs (Sulindac) .... One twice daily with meals as needed for arthritis 20)  Afrin Nasal Spray 0.05 % Soln (Oxymetazoline hcl) .... 2 puffs every 12 hrs. x 5 days and stop 21)  Lotrisone 1-0.05 % Crea (Clotrimazole-betamethasone) .... Apply twice daily as needed 22)  Proair Hfa 108 (90 Base) Mcg/act Aers (Albuterol sulfate) .[name].. 1-2 puffs  every 4-6 hours as needed 23)  Cerave Lotn (Emollient) .... Apply at bedtime as needed  Patient Instructions: 1)  Follow med  calendar closely and bring to each visit.  2)  follow up Dr. [name] in 3 months and as needed  3)  Please contact office for sooner follow up as needed

## 2010-02-27 NOTE — Progress Notes (Signed)
Summary: rx request  Phone Note Call from Patient Call back at Home Phone [phone]   Caller: Patient Call For: wert Summary of Call: [facility] [address] [facility]IR[facility]  Initial call taken by: Tivis Ringer, CNA,  September 02, 2009 12:52 PM  Follow-up for Phone Call        Rx was refilled.  LMOM for pt to be made aware. Follow-up by: Vernie Murders,  September 02, 2009 1:31 PM    Prescriptions: PROAIR HFA 108 (90 BASE) MCG/ACT AERS (ALBUTEROL SULFATE) 1-2 puffs every 4-6 hours as needed  #1 x 1   Entered by:   Vernie Murders   Authorized by:   Nyoka Cowden MD   Signed by:   Vernie Murders on 09/02/2009   Method used:   Electronically to        [facility]  [place] [address] (retail)       [address]       [place], [place]  [address]       Ph: [phone] or [phone]       Fax: [phone]   RxID:   [id number]

## 2010-02-27 NOTE — Letter (Signed)
[facility]   Imported By: Sherian Rein 06/03/2009 09:12:27  _____________________________________________________________________  External Attachment:    Type:   Image     Comment:   External Document

## 2010-02-27 NOTE — Assessment & Plan Note (Signed)
Summary: Primary svc/ acute ov for rash left elbow   Primary Provider/Referring Provider:  Dr.  [name]  CC:  Acute visit.  Pt c/o rash on left arm and left side x 1 wk.  Pt states rash is very itchy and sometimes painful.  [name]  History of Present Illness: 77  yowm with morbid obesity with boderline DM and  tendency to peripheral edema that is felt to be dependent.   December 07, 2007--c/o  left groin pain and swelling. . Seen at ER 12/04/2007, dx with left inguinal hernia, refered to [facility] with negative eval for hernia subsequently seen by [facility] who felt a disc and rec PT at [place] made worse but his understanding nothing else to offer.  February 15, 2008--ov  follow up and med reivew. 1. Miralax is helping along w/ stool softner with constipation. 2. Back pain is better, seen by [facility] receiving pain shot in back, no office notes avail. 3. Meds are correct w/ med ist.   May 01, 2008 ov better but confused again re Med reconciliation, seeing Patsi Sears for urinary hesitance.     Jun 11, 2008 --returns for follow up and med review. Had vitamin D level at URology office 05/04/08 - improved from 29 to 44. Still has joint pain but some improvment w/ meds (Sulidac and Tylenol ). Meds are correct w/ med list.   October 02, 2008 ov new c/o nasal congestion and ha x 3 weeks after stopped nasonex not really following med calendar though trying,    December 03, 2008 ov with recurrent syncope since last ov occurred in recliner [facility] all the same meds then pacemaker Nov 16 and no spells since  March 04, 2009 3 month follow up.  c/o chest congestion, increased SOB with activity and at rest, wheezing,  chest tightness, nonproductive cough, and headache x1 wk. in terms of cough and wheeze  proaire helps, used on day of ov x 2 puffs,  no purulent sputum. not using prns very effectively.    April 24, 2009--Returns for post hospital follow up. Admitted 04/11/2009- 04/15/2009 for CAP and  leukopenia. CX were neg. CXR showed LLL infiltrate. He was tx w/ IV abx and aggressive pulmonary hygiene. He is doing much better w/ decreased dyspnea/cough. Has finished antibiotics. Is using flutter valve few times a day. Denies chest pain,  orthopnea, hemoptysis, fever, n/v/d, edema, headache. His wbc did trend down during admission wbc 4.3>>1.9. Dr Clelia Croft following.  May 08, 2009 2 wk followup.  Pt states that chest congestion is improving as well as his breathing.  He states that he still feels very fatigued, still needing proaire up to 4 x daily whereas as previously only 4 x weekly.  no purulent sputum.  rx prednsione x 6 days> resolved  May 21, 2009 Acute visit c/o rash on left arm and left side onset April 22   Pt states rash is very itchy and sometimes painful. Pt denies any significant sore throat, dysphagia, itching, sneezing,  nasal congestion or excess secretions,  fever, chills, sweats, unintended wt loss, pleuritic or exertional cp, hempoptysis, change in activity tolerance  orthopnea pnd or leg swelling   Current Medications (verified): 1)  Kay Ciel 20 Meq  Pack (Potassium Chloride) .[name].. 1 Daily 2)  Furosemide 20 Mg  Tabs (Furosemide) .[name].. 1 Once Daily 3)  Lexapro 20 Mg Tabs (Escitalopram Oxalate) .... Once Daily 4)  Trazodone Hcl 150 Mg  Tabs (Trazodone Hcl) .... 1/3  Tablet By Mouth At Bedtime 5)  Citrucel   Powd (Methylcellulose (Laxative)) .[name].. 1 Scoop Daily 6)  Multivitamins   Tabs (Multiple Vitamin) .[name].. 1 Once Daily 7)  Adult Aspirin Ec Low Strength 81 Mg  Tbec (Aspirin) .[name].. 1 Once Daily 8)  Miralax  Powd (Polyethylene Glycol 3350) .[name].. 1 Capful At Bedtime 9)  Omeprazole 20 Mg Tbec (Omeprazole) .... Take 1 Tablet By Mouth 30 Minutes Before First Meal 10)  Os-Cal 500 + D 500-200 Mg-Unit Tabs (Calcium Carbonate-Vitamin D) .[name].. 1 Two Times A Day 11)  Vitamin D 1000 Unit Tabs (Cholecalciferol) .[name].. 1 Two Times A Day 12)  Finasteride 5 Mg Tabs (Finasteride) .... Take 1 Tablet  By Mouth Once A Day 13)  Nasonex 50 Mcg/act Susp (Mometasone Furoate) .... Use 1 -2 Puffs Two Times A Day 14)  Robitussin Dm 100-10 Mg/22ml  Syrp (Dextromethorphan-Guaifenesin) .... As Directed On Bottle 15)  Tylenol Extra Strength 500 Mg  Tabs (Acetaminophen) .[name].. 1-2 Every 4-6 Hrs As Needed 16)  Cvs Stool Softener 100 Mg Caps (Docusate Sodium) .... As Directed 17)  Advil Cold & Sinus Liqui-Gels 30-200 Mg  Caps (Pseudoephedrine-Ibuprofen) .... As Directed 18)  Sulindac 200 Mg Tabs (Sulindac) .... One Twice Daily With Meals As Needed For Arthritis 19)  Afrin Nasal Spray 0.05 % Soln (Oxymetazoline Hcl) .... 2 Puffs Every 12 Hrs. X 5 Days and Stop 20)  Proair Hfa 108 (90 Base) Mcg/act Aers (Albuterol Sulfate) .[name].. 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..............................Marland KitchenWert   -  Td 10/07   -  Pneumovax 10/07 second shot    -  CPX  11/08/07    --colon 2/07 -int/extern. hems (repeat q7y) Complex med regimen --Meds reviewed with pt education and computerized med calendar completed/adjusted. November 08, 2008  Syncope...........................................................................................................Marland KitchenHochrein    -   Mobitz II AV  block s/p Medtronic pacemaker placement 12/11/2008  Vital Signs:  Patient profile:   77 year old male Weight:      258 pounds O2 Sat:      96 % on Room air Temp:     97.9 degrees F oral Pulse rate:   89 / minute BP sitting:   126 / 78  (left arm) Cuff size:   large  Vitals Entered By: Vernie Murders (May 21, 2009 9:44 AM)  O2 Flow:  Room air  Physical Exam  Additional Exam:  wt 244  January 16, 2008 >  247 March 04, 2009 >>249 April 24, 2009 > 253 May 08, 2009 > 258 May 21, 2009  Ambulatory obese wm  in no acute distress. Afeb with normal vital signs HEENT: nl dentition, turbinates, and orophanx. Nl external ear canals without cough reflex Neck without JVD/Nodes/TM Lungs trace exp wheeze without cough on insp or exp maneuvers RRR no s3 or murmur or increase in P2, no sign edema Abd soft and benign with nl excursion in the supine position. Ext warm  without calf tenderness, cyanosis clubbing  mild/mod chronic venous insufficiency changes Erythematous slt indurated rash palm sized in Left antecubital fossa with ? satellite lesions     Impression & Recommendations:  Problem # 1:  SKIN RASH (ICD-782.1)  His updated medication list for this problem includes:    Lotrisone 1-0.05 % Crea (Clotrimazole-betamethasone) .[name]... Apply twice daily as needed  Orders: Prescription Created Electronically [phone]) Est. Patient Level III (60454)  c/w dermatitis with ? satelite areas suggesive of a bad intergrigo  Each maintenance medication was reviewed in detail including most importantly the difference between maintenance prns and under what circumstances the prns are to be used.  In addition, these two groups (for which the patient should keep up with refills) were distinguished from a third group :  meds that are used only short term with the intent to complete a course of therapy and then not refill them.  The med list was then fully reconciled and reorganized to reflect this  important distinction.   Problem # 2:  HYPERLIPIDEMIA NEC/NOS (ICD-272.4)  Labs Reviewed: SGOT: 22 (11/08/2007)   SGPT: 29 (11/08/2007)   HDL:28.6 (11/08/2007), 27.4 (11/08/2006)  LDL:77 (11/08/2007), DEL (09/81/1914)  Chol:142 (11/08/2007), 142 (11/08/2006)  Trig:184 (11/08/2007), 277 (11/08/2006)  Orders: Est. Patient Level III (78295)  Problem # 3:  COUGH (ICD-786.2) resolved on present rx.  Each maintenance medication was reviewed in detail including most importantly the difference between maintenance and as needed and under what circumstances the prns are to be used. This was done in the context of a medication calendar review which provided the patient with a user-friendly unambiguous mechanism for medication administration and reconciliation and provides an action plan for all active problems. It is critical that this be shown to every doctor  for modification during the office visit if necessary so the patient can use it as a working document.   Medications Added to Medication List This Visit: 1)  Lotrisone 1-0.05 % Crea (Clotrimazole-betamethasone) .... Apply twice daily as needed  Patient Instructions: 1)  Lotrisone apply twice daily as needed 2)  See calendar for specific medication instructions and bring it back for each and every office visit for every healthcare provider you see.  Without it,  you may not receive the best quality medical care that we feel you deserve.  Prescriptions: LOTRISONE 1-0.05 % CREA (CLOTRIMAZOLE-BETAMETHASONE) apply twice daily as needed  #30 gm x 11   Entered and Authorized by:   Nyoka Cowden MD   Signed by:   Nyoka Cowden MD on 05/21/2009   Method used:   Electronically to        [facility]  [place] [address] (retail)       [address]       [place], [place]  [address]       Ph: [phone] or [phone]       Fax: [phone]   RxID:   [id number]   Appended Document: Orders Update     Clinical Lists Changes  Orders: Added new  Service order of Prescription Created Electronically [phone]) - Signed

## 2010-02-27 NOTE — Letter (Signed)
[facility]   Imported By: Lester McArthur 04/03/2009 11:40:07  _____________________________________________________________________  External Attachment:    Type:   Image     Comment:   External Document

## 2010-03-13 ENCOUNTER — Encounter (HOSPITAL_BASED_OUTPATIENT_CLINIC_OR_DEPARTMENT_OTHER): Payer: Medicare Other | Admitting: Oncology

## 2010-03-13 ENCOUNTER — Other Ambulatory Visit: Payer: Self-pay | Admitting: Oncology

## 2010-03-13 DIAGNOSIS — C8588 Other specified types of non-Hodgkin lymphoma, lymph nodes of multiple sites: Secondary | ICD-10-CM

## 2010-03-13 DIAGNOSIS — D649 Anemia, unspecified: Secondary | ICD-10-CM

## 2010-03-13 LAB — CBC WITH DIFFERENTIAL/PLATELET
Basophils Absolute: 0 10*3/uL (ref 0.0–0.1)
EOS%: 2.4 % (ref 0.0–7.0)
Eosinophils Absolute: 0.1 10*3/uL (ref 0.0–0.5)
HCT: 30.3 % — ABNORMAL LOW (ref 38.4–49.9)
HGB: 10.6 g/dL — ABNORMAL LOW (ref 13.0–17.1)
MCH: 37.4 pg — ABNORMAL HIGH (ref 27.2–33.4)
MONO#: 0.1 10*3/uL (ref 0.1–0.9)
NEUT%: 44 % (ref 39.0–75.0)
lymph#: 1.1 10*3/uL (ref 0.9–3.3)

## 2010-03-13 NOTE — Letter (Signed)
Summary: [facility]  [facility]   Imported By: Sherian Rein 03/03/2010 11:26:54  _____________________________________________________________________  External Attachment:    Type:   Image     Comment:   External Document

## 2010-03-26 ENCOUNTER — Encounter: Payer: Self-pay | Admitting: Internal Medicine

## 2010-03-26 ENCOUNTER — Ambulatory Visit (INDEPENDENT_AMBULATORY_CARE_PROVIDER_SITE_OTHER): Payer: Medicare Other | Admitting: Internal Medicine

## 2010-03-26 DIAGNOSIS — E669 Obesity, unspecified: Secondary | ICD-10-CM

## 2010-03-26 DIAGNOSIS — R609 Edema, unspecified: Secondary | ICD-10-CM

## 2010-03-26 DIAGNOSIS — I10 Essential (primary) hypertension: Secondary | ICD-10-CM

## 2010-04-03 NOTE — Assessment & Plan Note (Signed)
Summary: Primary svc/ ext f/u multiple issues   Primary Provider/Referring Provider:  Dr.  [name]   History of Present Illness: 25 yowm quit smoking 1970  with morbid obesity with boderline DM and  tendency to peripheral edema that is felt to be dependent.   December 07, 2007--c/o  left groin pain and swelling. . Seen at ER 12/04/2007, dx with left inguinal hernia, refered to [facility] with negative eval for hernia subsequently seen by [facility] who felt a disc and rec PT at [place] made worse but his understanding nothing else to offer.  February 15, 2008--ov  follow up and med reivew. 1. Miralax is helping along w/ stool softner with constipation. 2. Back pain is better, seen by [facility] receiving pain shot in back, no office notes avail. 3. Meds are correct w/ med ist.   May 01, 2008 ov better but confused again re Med reconciliation, seeing Patsi Sears for urinary hesitance.     Jun 11, 2008 --returns for follow up and med review. Had vitamin D level at URology office 05/04/08 - improved from 29 to 44. Still has joint pain but some improvment w/ meds (Sulidac and Tylenol ). Meds are correct w/ med list.   October 02, 2008 ov new c/o nasal congestion and ha x 3 weeks after stopped nasonex not really following med calendar though trying.   December 03, 2008 ov with recurrent syncope since last ov occurred in recliner [facility] all the same meds then pacemaker Nov 16 and no spells since  March 04, 2009 3 month follow up.  c/o chest congestion, increased SOB with activity and at rest, wheezing,  chest tightness, nonproductive cough, and headache x1 wk. in terms of cough and wheeze  proaire helps, used on day of ov x 2 puffs,  no purulent sputum. not using prns very effectively.    April 24, 2009--Returns for post hospital follow up. Admitted 04/11/2009- 04/15/2009 for CAP and leukopenia. CX were neg. CXR showed LLL infiltrate. He was tx w/ IV abx and aggressive pulmonary hygiene. He is  doing much better w/ decreased dyspnea/cough. Has finished antibiotics. Is using flutter valve few times a day. Denies chest pain,  orthopnea, hemoptysis, fever, n/v/d, edema, headache. His wbc did trend down during admission wbc 4.3>>1.9. Dr Clelia Croft following.  May 08, 2009 2 wk followup.  Pt states that chest congestion is improving as well as his breathing.  He states that he still feels very fatigued, still needing proaire up to 4 x daily whereas as previously only 4 x weekly.  no purulent sputum.  rx prednsione x 6 days> resolved  May 21, 2009 Acute visit c/o rash on left arm and left side onset April 22   Pt states rash is very itchy and sometimes painful. rec lotrisone   August 06, 2009 cc  for the past month he is unable to walk at a brisk pace without getting out of breath.  He still has dry cough "nothing new".  very inactive no longer doing treadmill.  no problem at night or sitting still. nasal congestion but not using nasonex as per calendar. August 21, 2009--Presents for follow up and med review.  pt brought all meds with him today.  no new complaints. He is doing well. We reviewed his meds and organized them into med calendar. CXR last visit w/ no acute findings. Labs showed persistent pancytopenia. Has follow up w Dr. Frederich Chick > not due until Jan 2012  October 29, 2009 Followup.  Pt c/o cough x 5 days- prod with minimal green sputum.  Also he c/o sneezing.  He has noticed some wheezing with exertion and also has had slight increase in SOB since cough started.  now needing saba whereas prev did not need it.  Doxycycline 100 mg twice daily x 7days Prednisone 4 each am x 2days, 2x2days, 1x2days and stop  See calendar . Work on inhaler technique   March 26, 2010 ov cc doe occ uses proaire,  avg once every other day , no noct need,  no purulent sputum. Pt denies any significant sore throat, dysphagia, itching, sneezing,  nasal congestion or excess secretions,  fever, chills, sweats,  unintended wt loss, pleuritic or exertional cp, hempoptysis, change in activity tolerance  orthopnea pnd or  incrase in chronic leg swelling.  Pt also denies any obvious fluctuation in symptoms with weather or environmental change or other alleviating or aggravating factors.       Current Medications (verified): 1)  Kay Ciel 20 Meq  Pack (Potassium Chloride) .[name].. 1 Daily 2)  Furosemide 20 Mg  Tabs (Furosemide) .[name].. 1 Once Daily 3)  Lexapro 20 Mg Tabs (Escitalopram Oxalate) .... Take 1 Tab By Mouth At Bedtime 4)  Trazodone Hcl 150 Mg  Tabs (Trazodone Hcl) .... 1/3 Tablet By Mouth At Bedtime 5)  Citrucel   Powd (Methylcellulose (Laxative)) .[name].. 1 Scoop Daily 6)  Multivitamins   Tabs (Multiple Vitamin) .[name].. 1 Once Daily 7)  Adult Aspirin Ec Low Strength 81 Mg  Tbec (Aspirin) .[name].. 1 Once Daily 8)  Miralax  Powd (Polyethylene Glycol 3350) .[name].. 1 Capful At Bedtime 9)  Omeprazole 20 Mg Tbec (Omeprazole) .... Take 1 Tablet By Mouth 30 Minutes Before First Meal 10)  Os-Cal 500 + D 500-200 Mg-Unit Tabs (Calcium Carbonate-Vitamin D) .[name].. 1 Two Times A Day 11)  Vitamin D 1000 Unit Tabs (Cholecalciferol) .[name].. 1 Two Times A Day 12)  Finasteride 5 Mg Tabs (Finasteride) .... Take 1 Tablet By Mouth Once A Day 13)  Nasonex 50 Mcg/act Susp (Mometasone Furoate) .... Use 1 -2 Puffs Two Times A Day 14)  Pepcid 20 Mg Tabs (Famotidine) .... Take 1 Tab By Mouth At Bedtime 15)  Robitussin Dm 100-10 Mg/50ml  Syrp (Dextromethorphan-Guaifenesin) .... As Directed On Bottle 16)  Tylenol Extra Strength 500 Mg  Tabs (Acetaminophen) .[name].. 1-2 Every 4-6 Hrs As Needed 17)  Cvs Stool Softener 100 Mg Caps (Docusate Sodium) .... As Directed 18)  Advil Cold & Sinus Liqui-Gels 30-200 Mg  Caps (Pseudoephedrine-Ibuprofen) .... As Directed 19)  Sulindac 200 Mg Tabs (Sulindac) .... One Twice Daily With Meals As Needed For Arthritis 20)  Afrin Nasal Spray 0.05 % Soln (Oxymetazoline Hcl) .... 2 Puffs Every 12 Hrs. X 5 Days and Stop 21)   Lotrisone 1-0.05 % Crea (Clotrimazole-Betamethasone) .... Apply Twice Daily As Needed 22)  Proair Hfa 108 (90 Base) Mcg/act Aers (Albuterol Sulfate) .[name].. 1-2 Puffs Every 4-6 Hours As Needed 23)  Cerave  Lotn (Emollient) .... Apply At Bedtime As Needed 24)  Aranesp Inj (? Strength) .[name].. 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.......Marland KitchenGranfortuna    - last chemo 10/08     -repeat CT/ABD CT 11/18/07--no reoccurence    - Pancytopenia August 06, 2009 > refer back to [facility] > aranesp rx Jan 2012  RHINITIS, ALLERGIC NOS (ICD-477.9) HYPERLIPIDEMIA NEC/NOS (ICD-272.4) EXOGENOUS OBESITY (  ICD-278.00)      -  ideal body weight less than 186 EDEMA (ICD-782.3) Vitamin D Deficiency- level 29>>44 (4/9//10) Left Hip pain onset around 6/09............................................................................Marland KitchenHiltz     - MRI 11/23/07 c/w L1-2 bulging disc indents the thecal sac with foraminal stenosis HEALTH MAINTENANCE..........................................................................................Marland KitchenWert   -  Td 10/07   -  Pneumovax 10/07 second shot    -  CPX  11/08/07    --colon 2/07 -int/extern. hems (repeat q7y) Complex med regimen --Meds reviewed with pt education and computerized med calendar completed/adjusted. November 08, 2008 , August 21, 2009 Syncope...........................................................................................................Marland KitchenHochrein    -   Mobitz II AV block s/p Medtronic pacemaker placement 12/11/2008 Dermatology.....................................................................................................[facility]    - Pruritic rash 04/2009 > tried lotrisone, referred to [facility] August 06, 2009   Vital Signs:  Patient profile:   77 year old male Weight:      259 pounds O2 Sat:      95 % on  Room air Temp:     98.0 degrees F oral Pulse rate:   98 / minute BP sitting:   120 / 78  (left arm) Cuff size:   large  Vitals Entered By: Vernie Murders (March 26, 2010 10:20 AM)  O2 Flow:  Room air  Physical Exam  Additional Exam:  wt 244  January 16, 2008 > 249 April 24, 2009 >  258 May 21, 2009  > 265 October 29, 2009  > 259 March 26, 2010  Ambulatory obese wm  in no acute distress. Afeb with normal vital signs HEENT: nl dentition, turbinates, and orophanx. Nl external ear canals without cough reflex Neck without JVD/Nodes/TM Lungs CTA  RRR no s3 or murmur or increase in P2, no sign edema Abd soft and benign with nl excursion in the supine position. Ext warm without calf tenderness, cyanosis clubbing  mild/mod chronic venous insufficiency changes     Impression & Recommendations:  Problem # 1:  DYSPNEA (ICD-786.09)  Most likely obesity and deconditioning based on chronicity with anemia a concern as well ? some better on aranesp and ? mild asthma ok on as needed proaire   Each maintenance medication was reviewed in detail including most importantly the difference between maintenance and as needed and under what circumstances the prns are to be used. This was done in the context of a medication calendar review which provided the patient with a user-friendly unambiguous mechanism for medication administration and reconciliation and provides an action plan for all active problems. It is critical that this be shown to every doctor  for modification during the office visit if necessary so the patient can use it as a working document.   Problem # 2:  ESSENTIAL HYPERTENSION, BENIGN (ICD-401.1)  His updated medication list for this problem includes:    Furosemide 20 Mg Tabs (Furosemide) .[name]... 1 once daily     Problem # 3:  HYPERLIPIDEMIA NEC/NOS (ICD-272.4)  Labs Reviewed: SGOT: 22 (11/08/2007)   SGPT: 29 (11/08/2007)   HDL:28.6 (11/08/2007), 27.4 (11/08/2006)  LDL:77  (11/08/2007), DEL (11/08/2006)  Chol:142 (11/08/2007), 142 (11/08/2006)  Trig:184 (11/08/2007), 277 (11/08/2006)   needs to return fasting   Problem # 4:  as  Medications Added to Medication List This Visit: 1)  Aranesp Inj (? Strength)  .[name].. 1 per month  Other Orders: Est. Patient Level IV (93235)  Patient Instructions: 1)  Return to office in 3 months, sooner if needed with CPX on return. 2)  Remember to keep  your proaire with you and  if you find you need it more often than you do now see either me or Tammy NP as soon as possible

## 2010-04-10 ENCOUNTER — Other Ambulatory Visit: Payer: Self-pay | Admitting: Oncology

## 2010-04-10 ENCOUNTER — Encounter (HOSPITAL_BASED_OUTPATIENT_CLINIC_OR_DEPARTMENT_OTHER): Payer: Medicare Other | Admitting: Oncology

## 2010-04-10 DIAGNOSIS — C8588 Other specified types of non-Hodgkin lymphoma, lymph nodes of multiple sites: Secondary | ICD-10-CM

## 2010-04-10 LAB — CBC WITH DIFFERENTIAL/PLATELET
BASO%: 0.5 % (ref 0.0–2.0)
Basophils Absolute: 0 10*3/uL (ref 0.0–0.1)
EOS%: 3.1 % (ref 0.0–7.0)
HCT: 30.4 % — ABNORMAL LOW (ref 38.4–49.9)
HGB: 10.5 g/dL — ABNORMAL LOW (ref 13.0–17.1)
LYMPH%: 60.6 % — ABNORMAL HIGH (ref 14.0–49.0)
MCH: 35.7 pg — ABNORMAL HIGH (ref 27.2–33.4)
MCHC: 34.5 g/dL (ref 32.0–36.0)
NEUT%: 33.2 % — ABNORMAL LOW (ref 39.0–75.0)
Platelets: 88 10*3/uL — ABNORMAL LOW (ref 140–400)
lymph#: 1.2 10*3/uL (ref 0.9–3.3)

## 2010-04-15 LAB — DIFFERENTIAL
Basophils Absolute: 0 10*3/uL (ref 0.0–0.1)
Basophils Relative: 0 % (ref 0–1)
Eosinophils Absolute: 0 10*3/uL (ref 0.0–0.7)
Eosinophils Relative: 2 % (ref 0–5)
Lymphocytes Relative: 36 % (ref 12–46)
Lymphs Abs: 0.8 10*3/uL (ref 0.7–4.0)
Monocytes Absolute: 0.1 10*3/uL (ref 0.1–1.0)
Monocytes Relative: 4 % (ref 3–12)
Neutro Abs: 1.3 10*3/uL — ABNORMAL LOW (ref 1.7–7.7)
Neutrophils Relative %: 58 % (ref 43–77)

## 2010-04-15 LAB — TISSUE HYBRIDIZATION (BONE MARROW)-NCBH

## 2010-04-15 LAB — CBC
HCT: 30.5 % — ABNORMAL LOW (ref 39.0–52.0)
Hemoglobin: 10.4 g/dL — ABNORMAL LOW (ref 13.0–17.0)
MCHC: 34 g/dL (ref 30.0–36.0)
MCV: 108.4 fL — ABNORMAL HIGH (ref 78.0–100.0)
Platelets: 118 10*3/uL — ABNORMAL LOW (ref 150–400)
RBC: 2.81 MIL/uL — ABNORMAL LOW (ref 4.22–5.81)
RDW: 16.6 % — ABNORMAL HIGH (ref 11.5–15.5)
WBC: 2.2 10*3/uL — ABNORMAL LOW (ref 4.0–10.5)

## 2010-04-15 LAB — CHROMOSOME ANALYSIS, BONE MARROW

## 2010-04-15 LAB — BONE MARROW EXAM

## 2010-04-20 LAB — STREP PNEUMONIAE URINARY ANTIGEN: Strep Pneumo Urinary Antigen: NEGATIVE

## 2010-04-20 LAB — DIFFERENTIAL
Basophils Absolute: 0 10*3/uL (ref 0.0–0.1)
Basophils Relative: 0 % (ref 0–1)
Eosinophils Absolute: 0.1 10*3/uL (ref 0.0–0.7)
Lymphocytes Relative: 16 % (ref 12–46)
Lymphocytes Relative: 41 % (ref 12–46)
Lymphs Abs: 0.7 10*3/uL (ref 0.7–4.0)
Monocytes Absolute: 0.1 10*3/uL (ref 0.1–1.0)
Monocytes Relative: 2 % — ABNORMAL LOW (ref 3–12)
Neutro Abs: 0.9 10*3/uL — ABNORMAL LOW (ref 1.7–7.7)
Neutro Abs: 3.7 10*3/uL (ref 1.7–7.7)
Neutrophils Relative %: 81 % — ABNORMAL HIGH (ref 43–77)

## 2010-04-20 LAB — BASIC METABOLIC PANEL
BUN: 11 mg/dL (ref 6–23)
CO2: 27 mEq/L (ref 19–32)
CO2: 31 mEq/L (ref 19–32)
Calcium: 9 mg/dL (ref 8.4–10.5)
Calcium: 9.2 mg/dL (ref 8.4–10.5)
Creatinine, Ser: 0.99 mg/dL (ref 0.4–1.5)
GFR calc Af Amer: >60 mL/min (ref >60)
GFR calc non Af Amer: >60 mL/min (ref >60)
Glucose, Bld: 135 mg/dL — ABNORMAL HIGH (ref 70–99)
Sodium: 134 mEq/L — ABNORMAL LOW (ref 135–145)
Sodium: 140 mEq/L (ref 135–145)

## 2010-04-20 LAB — CULTURE, RESPIRATORY W GRAM STAIN

## 2010-04-20 LAB — GLUCOSE, CAPILLARY
Glucose-Capillary: 131 mg/dL — ABNORMAL HIGH (ref 70–99)
Glucose-Capillary: 137 mg/dL — ABNORMAL HIGH (ref 70–99)
Glucose-Capillary: 140 mg/dL — ABNORMAL HIGH (ref 70–99)
Glucose-Capillary: 140 mg/dL — ABNORMAL HIGH (ref 70–99)
Glucose-Capillary: 154 mg/dL — ABNORMAL HIGH (ref 70–99)

## 2010-04-20 LAB — PATHOLOGIST SMEAR REVIEW

## 2010-04-20 LAB — VITAMIN B12: Vitamin B-12: 310 pg/mL (ref 211–911)

## 2010-04-20 LAB — LEGIONELLA ANTIGEN, URINE: Legionella Antigen, Urine: NEGATIVE

## 2010-04-20 LAB — CBC
HCT: 25.9 % — ABNORMAL LOW (ref 39.0–52.0)
Hemoglobin: 9.4 g/dL — ABNORMAL LOW (ref 13.0–17.0)
Hemoglobin: 9.5 g/dL — ABNORMAL LOW (ref 13.0–17.0)
MCHC: 34.6 g/dL (ref 30.0–36.0)
MCHC: 34.7 g/dL (ref 30.0–36.0)
MCV: 107.3 fL — ABNORMAL HIGH (ref 78.0–100.0)
Platelets: 146 10*3/uL — ABNORMAL LOW (ref 150–400)
Platelets: 154 10*3/uL (ref 150–400)
RBC: 2.56 MIL/uL — ABNORMAL LOW (ref 4.22–5.81)
RDW: 14.8 % (ref 11.5–15.5)
RDW: 15 % (ref 11.5–15.5)
WBC: 2.5 10*3/uL — ABNORMAL LOW (ref 4.0–10.5)
WBC: 4.6 10*3/uL (ref 4.0–10.5)

## 2010-04-20 LAB — EXPECTORATED SPUTUM ASSESSMENT W GRAM STAIN, RFLX TO RESP C

## 2010-04-20 LAB — CULTURE, BLOOD (ROUTINE X 2): Culture: NO GROWTH

## 2010-04-20 LAB — APTT: aPTT: 32 seconds (ref 24–37)

## 2010-04-20 LAB — PROTIME-INR: INR: 1.16 (ref 0.00–1.49)

## 2010-04-20 LAB — RETICULOCYTES
RBC.: 2.57 MIL/uL — ABNORMAL LOW (ref 4.22–5.81)
Retic Count, Absolute: 48.8 10*3/uL (ref 19.0–186.0)

## 2010-04-20 LAB — FOLATE RBC: RBC Folate: 1085 ng/mL — ABNORMAL HIGH (ref 180–600)

## 2010-04-20 LAB — HEMOCCULT GUIAC POC 1CARD (OFFICE): Fecal Occult Bld: NEGATIVE

## 2010-04-28 ENCOUNTER — Ambulatory Visit (INDEPENDENT_AMBULATORY_CARE_PROVIDER_SITE_OTHER): Payer: Medicare Other

## 2010-04-28 ENCOUNTER — Inpatient Hospital Stay (INDEPENDENT_AMBULATORY_CARE_PROVIDER_SITE_OTHER)
Admission: RE | Admit: 2010-04-28 | Discharge: 2010-04-28 | Disposition: A | Discharge status: Home / Self Care | Payer: Medicare Other | Source: Ambulatory Visit | Attending: Emergency Medicine | Admitting: Emergency Medicine

## 2010-04-28 ENCOUNTER — Emergency Department (HOSPITAL_COMMUNITY)
Admission: EM | Admit: 2010-04-28 | Discharge: 2010-04-29 | Disposition: A | Discharge status: Home / Self Care | Payer: Medicare Other | Attending: Emergency Medicine | Admitting: Emergency Medicine

## 2010-04-28 DIAGNOSIS — K219 Gastro-esophageal reflux disease without esophagitis: Secondary | ICD-10-CM | POA: Insufficient documentation

## 2010-04-28 DIAGNOSIS — E669 Obesity, unspecified: Secondary | ICD-10-CM | POA: Insufficient documentation

## 2010-04-28 DIAGNOSIS — D649 Anemia, unspecified: Secondary | ICD-10-CM | POA: Insufficient documentation

## 2010-04-28 DIAGNOSIS — B999 Unspecified infectious disease: Secondary | ICD-10-CM

## 2010-04-28 DIAGNOSIS — R3915 Urgency of urination: Secondary | ICD-10-CM | POA: Insufficient documentation

## 2010-04-28 DIAGNOSIS — R509 Fever, unspecified: Secondary | ICD-10-CM | POA: Insufficient documentation

## 2010-04-28 DIAGNOSIS — R059 Cough, unspecified: Secondary | ICD-10-CM | POA: Insufficient documentation

## 2010-04-28 DIAGNOSIS — Z951 Presence of aortocoronary bypass graft: Secondary | ICD-10-CM | POA: Insufficient documentation

## 2010-04-28 DIAGNOSIS — R05 Cough: Secondary | ICD-10-CM | POA: Insufficient documentation

## 2010-04-28 DIAGNOSIS — Z950 Presence of cardiac pacemaker: Secondary | ICD-10-CM | POA: Insufficient documentation

## 2010-04-28 DIAGNOSIS — D696 Thrombocytopenia, unspecified: Secondary | ICD-10-CM | POA: Insufficient documentation

## 2010-04-28 LAB — DIFFERENTIAL
Basophils Relative: 0 % (ref 0–1)
Eosinophils Absolute: 0 10*3/uL (ref 0.0–0.7)
Eosinophils Relative: 0 % (ref 0–5)
Monocytes Absolute: 0.1 10*3/uL (ref 0.1–1.0)
Monocytes Relative: 3 % (ref 3–12)

## 2010-04-28 LAB — POCT URINALYSIS DIP (DEVICE)
Protein, ur: 100 mg/dL — AB
Specific Gravity, Urine: 1.02 (ref 1.005–1.030)
Urobilinogen, UA: 0.2 mg/dL (ref 0.0–1.0)
pH: 6 (ref 5.0–8.0)

## 2010-04-28 LAB — BASIC METABOLIC PANEL
CO2: 28 mEq/L (ref 19–32)
Calcium: 9.1 mg/dL (ref 8.4–10.5)
Creatinine, Ser: 0.91 mg/dL (ref 0.4–1.5)
GFR calc Af Amer: >60 mL/min (ref >60)

## 2010-04-28 LAB — CBC
MCH: 36.6 pg — ABNORMAL HIGH (ref 26.0–34.0)
MCHC: 34.6 g/dL (ref 30.0–36.0)
Platelets: 92 10*3/uL — ABNORMAL LOW (ref 150–400)
RDW: 15.3 % (ref 11.5–15.5)

## 2010-04-28 LAB — POCT I-STAT, CHEM 8
BUN: 15 mg/dL (ref 6–23)
Calcium, Ion: 1.07 mmol/L — ABNORMAL LOW (ref 1.12–1.32)
Creatinine, Ser: 1.1 mg/dL (ref 0.4–1.5)
TCO2: 23 mmol/L (ref 0–100)

## 2010-04-29 ENCOUNTER — Telehealth: Payer: Self-pay | Admitting: Internal Medicine

## 2010-04-29 ENCOUNTER — Encounter: Payer: Self-pay | Admitting: Adult Health

## 2010-04-29 LAB — URINALYSIS, ROUTINE W REFLEX MICROSCOPIC
Hgb urine dipstick: NEGATIVE
Protein, ur: 100 mg/dL — AB
Urobilinogen, UA: 0.2 mg/dL (ref 0.0–1.0)

## 2010-04-29 LAB — URINE MICROSCOPIC-ADD ON

## 2010-04-29 NOTE — Telephone Encounter (Signed)
SPoke with [name] pt daughter and pt was son at the urgent care last night and was just dx with having a fever. Pt daughter states they were going to admit pt to the hospital but tthen they didn't. Daughter states pt is coughing, congestion, and wheezing and wanted pt to be seen today. I advised daughter we had no openings today since pt is MW primary care pt and TP was the one who had to see him and she was booked until tomorrow. Daughter made pt apt to see TP tomorrow at 9:15. I advised daughter if she feels like pt is getting worse before tomorrow morning then she needed to take pt to the ED to evaluated. Daughter verbalized understanding

## 2010-04-30 ENCOUNTER — Encounter: Payer: Self-pay | Admitting: Adult Health

## 2010-04-30 ENCOUNTER — Ambulatory Visit (INDEPENDENT_AMBULATORY_CARE_PROVIDER_SITE_OTHER): Payer: Medicare Other | Admitting: Adult Health

## 2010-04-30 VITALS — BP 102/62 | HR 102 | Temp 99.2°F | Ht 70.0 in | Wt 266.0 lb

## 2010-04-30 DIAGNOSIS — J209 Acute bronchitis, unspecified: Secondary | ICD-10-CM

## 2010-04-30 LAB — DIFFERENTIAL
Basophils Absolute: 0 10*3/uL (ref 0.0–0.1)
Basophils Relative: 0 % (ref 0–1)
Eosinophils Absolute: 0 10*3/uL (ref 0.0–0.7)
Monocytes Relative: 3 % (ref 3–12)
Neutro Abs: 2.3 10*3/uL (ref 1.7–7.7)
Neutrophils Relative %: 66 % (ref 43–77)

## 2010-04-30 LAB — BASIC METABOLIC PANEL
BUN: 18 mg/dL (ref 6–23)
CO2: 23 mEq/L (ref 19–32)
Calcium: 8.8 mg/dL (ref 8.4–10.5)
Chloride: 103 mEq/L (ref 96–112)
Creatinine, Ser: 1.31 mg/dL (ref 0.4–1.5)
GFR calc Af Amer: >60 mL/min (ref >60)
Glucose, Bld: 186 mg/dL — ABNORMAL HIGH (ref 70–99)

## 2010-04-30 LAB — CBC
MCHC: 35.5 g/dL (ref 30.0–36.0)
MCV: 108.7 fL — ABNORMAL HIGH (ref 78.0–100.0)
Platelets: 127 10*3/uL — ABNORMAL LOW (ref 150–400)
RBC: 2.88 MIL/uL — ABNORMAL LOW (ref 4.22–5.81)
RDW: 15.2 % (ref 11.5–15.5)

## 2010-04-30 LAB — POCT CARDIAC MARKERS
CKMB, poc: <1 ng/mL — ABNORMAL LOW (ref 1.0–8.0)
Myoglobin, poc: 83.6 ng/mL (ref 12–200)
Troponin i, poc: <0.05 ng/mL (ref 0.00–0.09)

## 2010-04-30 LAB — POCT I-STAT, CHEM 8
BUN: 22 mg/dL (ref 6–23)
Chloride: 104 mEq/L (ref 96–112)
Creatinine, Ser: 1 mg/dL (ref 0.4–1.5)
Potassium: 4.3 mEq/L (ref 3.5–5.1)
Sodium: 139 mEq/L (ref 135–145)
TCO2: 26 mmol/L (ref 0–100)

## 2010-04-30 LAB — CARDIAC PANEL(CRET KIN+CKTOT+MB+TROPI)
CK, MB: 3 ng/mL (ref 0.3–4.0)
Relative Index: 2.3 (ref 0.0–2.5)
Total CK: 132 U/L (ref 7–232)
Troponin I: 0.11 ng/mL — ABNORMAL HIGH (ref 0.00–0.06)
Troponin I: 0.17 ng/mL — ABNORMAL HIGH (ref 0.00–0.06)

## 2010-04-30 LAB — PROTIME-INR: INR: 1.04 (ref 0.00–1.49)

## 2010-04-30 LAB — CK TOTAL AND CKMB (NOT AT ARMC)
Relative Index: INVALID (ref 0.0–2.5)
Total CK: 58 U/L (ref 7–232)

## 2010-04-30 MED ORDER — LEVALBUTEROL HCL 0.63 MG/3ML IN NEBU
0.6300 mg | INHALATION_SOLUTION | Freq: Once | RESPIRATORY_TRACT | Status: AC
  Administered 2010-04-30: 0.63 mg via RESPIRATORY_TRACT

## 2010-04-30 MED ORDER — METHYLPREDNISOLONE ACETATE 80 MG/ML IJ SUSP
120.0000 mg | Freq: Once | INTRAMUSCULAR | Status: AC
  Administered 2010-04-30: 120 mg via INTRAMUSCULAR

## 2010-04-30 MED ORDER — HYDROCODONE-HOMATROPINE 5-1.5 MG/5ML PO SYRP: 5.0000 mL | ORAL_SOLUTION | Freq: Four times a day (QID) | ORAL | Status: AC | PRN

## 2010-04-30 MED ORDER — LEVOFLOXACIN 500 MG PO TABS: 500.0000 mg | ORAL_TABLET | Freq: Every day | ORAL | Status: AC

## 2010-04-30 NOTE — Progress Notes (Signed)
Addended by: Gweneth Dimitri on: 04/30/2010 12:25 PM   Modules accepted: Orders

## 2010-04-30 NOTE — Patient Instructions (Addendum)
Levaquin 500mg  daily for 7 days -take with food Mucinex DM Twice daily  As needed  Cough/congestion Fluids and rest  Hydromet 1/2 tsp At bedtime  As needed  For cough-may make you sleepy.  Tylenol As needed  For fever.  Please contact office for sooner follow up if symptoms do not improve or worsen or seek emergency care   follow up 1 week and As needed

## 2010-04-30 NOTE — Progress Notes (Signed)
Subjective:    Patient ID: [name], male    DOB: [birth year], 77 y.o.   MRN: [id number]  HPI 77 yowm quit smoking 1970 with morbid obesity with boderline DM, Lymphoma s/p chemo ([facility]) and tendency to peripheral edema that is felt to be dependent.   April 24, 2009--Returns for post hospital follow up. Admitted 04/11/2009- 04/15/2009 for CAP and leukopenia. CX were neg. CXR showed LLL infiltrate. He was tx w/ IV abx and aggressive pulmonary hygiene. He is doing much better w/ decreased dyspnea/cough. Has finished antibiotics. Is using flutter valve few times a day. Denies chest pain, orthopnea, hemoptysis, fever, n/v/d, edema, headache. His wbc did trend down during admission wbc 4.3>>1.9. Dr Clelia Croft following.   May 08, 2009 2 wk followup. Pt states that chest congestion is improving as well as his breathing. He states that he still feels very fatigued, still needing proaire up to 4 x daily whereas as previously only 4 x weekly. no purulent sputum. rx prednsione x 6 days> resolved   May 21, 2009 Acute visit c/o rash on left arm and left side onset April 22 Pt states rash is very itchy and sometimes painful. rec lotrisone   August 06, 2009 cc for the past month he is unable to walk at a brisk pace without getting out of breath. He still has dry cough "nothing new". very inactive no longer doing treadmill. no problem at night or sitting still. nasal congestion but not using nasonex as per calendar.   August 21, 2009--Presents for follow up and med review. pt brought all meds with him today. no new complaints. He is doing well. We reviewed his meds and organized them into med calendar. CXR last visit w/ no acute findings. Labs showed persistent pancytopenia. Has follow up w Dr. Frederich Chick > not due until Jan 2012   October 29, 2009 Followup. Pt c/o cough x 5 days- prod with minimal green sputum. Also he c/o sneezing. He has noticed some wheezing with exertion and also has had slight increase  in SOB since cough started. now needing saba whereas prev did not need it. Rx Doxycycline, steroid taper.    March 26, 2010 ov cc doe occ uses proaire, avg once every other day , no noct need, no purulent sputum.    >>no changes   04/30/2010 Acute OV--Complains of chest congestion, dry cough, increased SOB, wheezing, x4days and fever 2 days ago. went to [facility] and ER on 04-28-10, was given neb tx only. CXR w/ no acute process, cbc unchanged. No UTI on urine. Pt says his cough started 4 days ago with barking cough to point of vomitting. Very hard to get up mucus. Started on Mucinex DM without much help. Has had fever-tmax 101.4. Daughter is with him today. He is eating and drinking well. No n/v/d, abdominal pain, urinary symptoms. Feels very worn out. Low energy.  Seen in ER but was discharged home without rx. He is no better today with persistent cough. No increased leg swelling.   Review of Systems Constitutional:   No  weight loss, night sweats,    HEENT:   No headaches,  Difficulty swallowing,  Tooth/dental problems, or  Sore throat,                No sneezing, itching, ear ache, nasal congestion, post nasal drip,   CV:  No chest pain,  Orthopnea, PND, swelling in lower extremities, anasarca, dizziness, palpitations, syncope.   GI  No heartburn, indigestion, abdominal pain,  nausea, vomiting, diarrhea, change in bowel habits, loss of appetite, bloody stools.   Resp:  No wheezing.  No chest wall deformity  Skin: no rash or lesions.  GU: no dysuria, change in color of urine, no urgency or frequency.  No flank pain, no hematuria   MS:  No joint pain or swelling.  No decreased range of motion.  No back pain.  Psych:  No change in mood or affect. No depression or anxiety.  No memory loss.     Objective:   Physical Exam GEN: A/Ox3; pleasant , NAD, well nourished , elderly male   HEENT:  [place]/AT,  EACs-clear, TMs-wnl, NOSE-clear, THROAT-clear, no lesions, no postnasal drip or exudate noted.    NECK:  Supple w/ fair ROM; no JVD; normal carotid impulses w/o bruits; no thyromegaly or nodules palpated; no lymphadenopathy.  RESP  Coarse BS w/ no crackles or wheezing noted.   CARD:  RRR, no m/r/g  , no peripheral edema, pulses intact, no cyanosis or clubbing. Varicose veins noted.   GI:   Soft & nt; nml bowel sounds; no organomegaly or masses detected.  Musco: Warm bil, no deformities or joint swelling noted.   Neuro: alert, no focal deficits noted.    Skin: Warm, no lesions or rashes          Assessment & Plan:

## 2010-04-30 NOTE — Assessment & Plan Note (Addendum)
Acute Bronchitis-slow to resolve.  CXR with no acute process, CBC without significant change (hx of pancytopenia f/by hematology)  Plan;  Xopenex neb in office  Depo Medrol 120mg  IM x 1 in office  Levaquin 500mg  daily for 7 days -take with food Mucinex DM Twice daily  As needed  Cough/congestion Fluids and rest  Hydromet 1/2 tsp At bedtime  As needed  For cough-may make you sleepy.  Tylenol As needed  For fever.  Please contact office for sooner follow up if symptoms do not improve or worsen or seek emergency care   follow up 1 week and As needed

## 2010-05-01 LAB — DIFFERENTIAL
Basophils Relative: 0 % (ref 0–1)
Eosinophils Absolute: 0 10*3/uL (ref 0.0–0.7)
Eosinophils Relative: 1 % (ref 0–5)
Eosinophils Relative: 0 % (ref 0–5)
Lymphocytes Relative: 15 % (ref 12–46)
Lymphs Abs: 0.4 10*3/uL — ABNORMAL LOW (ref 0.7–4.0)
Monocytes Absolute: 0.1 10*3/uL (ref 0.1–1.0)
Monocytes Relative: 5 % (ref 3–12)
Neutro Abs: 2.3 10*3/uL (ref 1.7–7.7)
Neutrophils Relative %: 70 % (ref 43–77)

## 2010-05-01 LAB — CBC
HCT: 31.9 % — ABNORMAL LOW (ref 39.0–52.0)
HCT: 32.3 % — ABNORMAL LOW (ref 39.0–52.0)
Hemoglobin: 10 g/dL — ABNORMAL LOW (ref 13.0–17.0)
Hemoglobin: 10.8 g/dL — ABNORMAL LOW (ref 13.0–17.0)
MCHC: 35.3 g/dL (ref 30.0–36.0)
MCV: 108.9 fL — ABNORMAL HIGH (ref 78.0–100.0)
MCV: 108 fL — ABNORMAL HIGH (ref 78.0–100.0)
Platelets: 120 10*3/uL — ABNORMAL LOW (ref 150–400)
Platelets: 115 10*3/uL — ABNORMAL LOW (ref 150–400)
Platelets: 124 10*3/uL — ABNORMAL LOW (ref 150–400)
RBC: 2.8 MIL/uL — ABNORMAL LOW (ref 4.22–5.81)
RDW: 15.1 % (ref 11.5–15.5)
RDW: 15.3 % (ref 11.5–15.5)
WBC: 2 10*3/uL — ABNORMAL LOW (ref 4.0–10.5)
WBC: 2.9 10*3/uL — ABNORMAL LOW (ref 4.0–10.5)

## 2010-05-01 LAB — BASIC METABOLIC PANEL
BUN: 11 mg/dL (ref 6–23)
CO2: 32 mEq/L (ref 19–32)
CO2: 30 mEq/L (ref 19–32)
CO2: 32 mEq/L (ref 19–32)
Calcium: 9 mg/dL (ref 8.4–10.5)
Chloride: 101 mEq/L (ref 96–112)
Chloride: 105 mEq/L (ref 96–112)
Creatinine, Ser: 0.89 mg/dL (ref 0.4–1.5)
GFR calc Af Amer: >60 mL/min (ref >60)
GFR calc non Af Amer: >60 mL/min (ref >60)
GFR calc non Af Amer: >60 mL/min (ref >60)
Glucose, Bld: 159 mg/dL — ABNORMAL HIGH (ref 70–99)
Glucose, Bld: 123 mg/dL — ABNORMAL HIGH (ref 70–99)
Potassium: 3.7 mEq/L (ref 3.5–5.1)
Sodium: 140 mEq/L (ref 135–145)
Sodium: 142 mEq/L (ref 135–145)

## 2010-05-01 LAB — CULTURE, BLOOD (ROUTINE X 2): Culture: NO GROWTH

## 2010-05-01 LAB — CK TOTAL AND CKMB (NOT AT ARMC)
CK, MB: 2.5 ng/mL (ref 0.3–4.0)
Relative Index: INVALID (ref 0.0–2.5)
Total CK: 66 U/L (ref 7–232)
Total CK: 92 U/L (ref 7–232)

## 2010-05-01 LAB — CARDIAC PANEL(CRET KIN+CKTOT+MB+TROPI)
CK, MB: 1.1 ng/mL (ref 0.3–4.0)
CK, MB: 1.5 ng/mL (ref 0.3–4.0)
CK, MB: 2 ng/mL (ref 0.3–4.0)
CK, MB: 2 ng/mL (ref 0.3–4.0)
Relative Index: INVALID (ref 0.0–2.5)
Relative Index: INVALID (ref 0.0–2.5)
Total CK: 88 U/L (ref 7–232)
Troponin I: 0.02 ng/mL (ref 0.00–0.06)

## 2010-05-01 LAB — RAPID URINE DRUG SCREEN, HOSP PERFORMED
Cocaine: NOT DETECTED
Tetrahydrocannabinol: NOT DETECTED

## 2010-05-01 LAB — URINALYSIS, ROUTINE W REFLEX MICROSCOPIC
Bilirubin Urine: NEGATIVE
Leukocytes, UA: NEGATIVE
Nitrite: NEGATIVE
Specific Gravity, Urine: 1.014 (ref 1.005–1.030)
Urobilinogen, UA: 0.2 mg/dL (ref 0.0–1.0)
pH: 5.5 (ref 5.0–8.0)

## 2010-05-01 LAB — URINE CULTURE

## 2010-05-01 LAB — COMPREHENSIVE METABOLIC PANEL
AST: 33 U/L (ref 0–37)
Albumin: 4 g/dL (ref 3.5–5.2)
BUN: 10 mg/dL (ref 6–23)
Chloride: 102 mEq/L (ref 96–112)
Creatinine, Ser: 0.96 mg/dL (ref 0.4–1.5)
GFR calc Af Amer: >60 mL/min (ref >60)
Total Bilirubin: 0.9 mg/dL (ref 0.3–1.2)
Total Protein: 6.5 g/dL (ref 6.0–8.3)

## 2010-05-01 LAB — IRON AND TIBC
Saturation Ratios: 52 % (ref 20–55)
TIBC: 312 ug/dL (ref 215–435)

## 2010-05-01 LAB — ABO/RH: ABO/RH(D): A POS

## 2010-05-01 LAB — FOLATE: Folate: >20 ng/mL

## 2010-05-01 LAB — FERRITIN: Ferritin: 254 ng/mL (ref 22–322)

## 2010-05-01 LAB — TYPE AND SCREEN

## 2010-05-01 LAB — GLUCOSE, CAPILLARY: Glucose-Capillary: 131 mg/dL — ABNORMAL HIGH (ref 70–99)

## 2010-05-01 LAB — URINE MICROSCOPIC-ADD ON

## 2010-05-01 LAB — TROPONIN I: Troponin I: 0.04 ng/mL (ref 0.00–0.06)

## 2010-05-01 LAB — BRAIN NATRIURETIC PEPTIDE: Pro B Natriuretic peptide (BNP): 280 pg/mL — ABNORMAL HIGH (ref 0.0–100.0)

## 2010-05-03 ENCOUNTER — Telehealth: Payer: Self-pay | Admitting: Emergency Medicine

## 2010-05-03 DIAGNOSIS — R05 Cough: Secondary | ICD-10-CM

## 2010-05-03 MED ORDER — PREDNISONE 10 MG PO TABS: ORAL_TABLET | ORAL | Status: DC

## 2010-05-03 NOTE — Telephone Encounter (Signed)
Daughter called to say pt continues to feel bad. No real decline, but certainly no improvement since seen by TP on 4/4. Taking levaquin, mucinex, received depomedrol 4/4. Cough non-prod, malaise, low grade fever.  Plan - will add pred taper to meds and follow. Advised her to seek care immediately if he declines, otherwise follow next week with TP or MW as planned.

## 2010-05-05 ENCOUNTER — Encounter: Payer: Self-pay | Admitting: Adult Health

## 2010-05-05 LAB — GLUCOSE, CAPILLARY: Glucose-Capillary: 124 mg/dL — ABNORMAL HIGH (ref 70–99)

## 2010-05-07 ENCOUNTER — Encounter: Payer: Self-pay | Admitting: Adult Health

## 2010-05-07 ENCOUNTER — Ambulatory Visit (INDEPENDENT_AMBULATORY_CARE_PROVIDER_SITE_OTHER): Payer: Medicare Other | Admitting: Adult Health

## 2010-05-07 DIAGNOSIS — J209 Acute bronchitis, unspecified: Secondary | ICD-10-CM

## 2010-05-07 DIAGNOSIS — R609 Edema, unspecified: Secondary | ICD-10-CM

## 2010-05-07 NOTE — Progress Notes (Signed)
Subjective:    Patient ID: [name], male    DOB: [birth year], 77 y.o.   MRN: [id number]  HPI  26 yowm quit smoking 1970 with morbid obesity with boderline DM, Lymphoma s/p chemo ([facility]) and tendency to peripheral edema that is felt to be dependent.   April 24, 2009--Returns for post hospital follow up. Admitted 04/11/2009- 04/15/2009 for CAP and leukopenia. CX were neg. CXR showed LLL infiltrate. He was tx w/ IV abx and aggressive pulmonary hygiene. He is doing much better w/ decreased dyspnea/cough. Has finished antibiotics. Is using flutter valve few times a day. Denies chest pain, orthopnea, hemoptysis, fever, n/v/d, edema, headache. His wbc did trend down during admission wbc 4.3>>1.9. Dr [name] following.   May 08, 2009 2 wk followup. Pt states that chest congestion is improving as well as his breathing. He states that he still feels very fatigued, still needing proaire up to 4 x daily whereas as previously only 4 x weekly. no purulent sputum. rx prednsione x 6 days> resolved   May 21, 2009 Acute visit c/o rash on left arm and left side onset April 22 Pt states rash is very itchy and sometimes painful. rec lotrisone   August 06, 2009 cc for the past month he is unable to walk at a brisk pace without getting out of breath. He still has dry cough "nothing new". very inactive no longer doing treadmill. no problem at night or sitting still. nasal congestion but not using nasonex as per calendar.   August 21, 2009--Presents for follow up and med review. pt brought all meds with him today. no new complaints. He is doing well. We reviewed his meds and organized them into med calendar. CXR last visit w/ no acute findings. Labs showed persistent pancytopenia. Has follow up w Dr. Frederich [name] > not due until Jan 2012   October 29, 2009 Followup. Pt c/o cough x 5 days- prod with minimal green sputum. Also he c/o sneezing. He has noticed some wheezing with exertion and also has had slight  increase in SOB since cough started. now needing saba whereas prev did not need it. Rx Doxycycline, steroid taper.    March 26, 2010 ov cc doe occ uses proaire, avg once every other day , no noct need, no purulent sputum.    >>no changes   04/30/10  Acute OV--Complains of chest congestion, dry cough, increased SOB, wheezing, x4days and fever 2 days ago. went to [facility] and ER on 04-28-10, was given neb tx only. CXR w/ no acute process, cbc unchanged. No UTI on urine. Pt says his cough started 4 days ago with barking cough to point of vomitting. Very hard to get up mucus. Started on Mucinex DM without much help. Has had fever-tmax 101.4. Daughter is with him today. He is eating and drinking well. No n/v/d, abdominal pain, urinary symptoms. Feels very worn out. Low energy.  Seen in ER but was discharged home without rx. He is no better today with persistent cough. No increased leg swelling. >>Dx with Bronchitis-tx w/ Avelox and Depo Medrol .   05/07/2010 Follow Up  Returns today for follow up . Seen last week for acute bronchitic symptoms. Was seen in ER on 04/28/10  with CXR neg for acute process. He was treated at our office on 4/4 with Avelox and Depo medrol . Did have persistent symptoms and called in on 4/7 -was called in steroid taper. He is slowly turning corner. He feels better but still weak, low energy.  His cough/congestion are better . No fevers. Good appetite. His legs are more swollen than usual. No chest pain or hemoptysis. Using mucinex dm and has few days left of steroid.    Review of Systems  Constitutional:   No  weight loss, night sweats,    HEENT:   No headaches,  Difficulty swallowing,  Tooth/dental problems, or  Sore throat,                No sneezing, itching, ear ache, nasal congestion, post nasal drip,   CV:  No chest pain,  Orthopnea, PND,  anasarca, dizziness, palpitations, syncope.   GI  No heartburn, indigestion, abdominal pain, nausea, vomiting, diarrhea, change in bowel habits,  loss of appetite, bloody stools.   Resp:  No wheezing.  No chest wall deformity  Skin: no rash or lesions.  GU: no dysuria, change in color of urine, no urgency or frequency.  No flank pain, no hematuria   MS:  No joint pain or swelling.  No decreased range of motion.  No back pain.  Psych:  No change in mood or affect. No depression or anxiety.  No memory loss.     Objective:   Physical Exam  GEN: A/Ox3; pleasant , NAD, well nourished , elderly male   HEENT:  [place]/AT,  EACs-clear, TMs-wnl, NOSE-clear, THROAT-clear, no lesions, no postnasal drip or exudate noted.   NECK:  Supple w/ fair ROM; no JVD; normal carotid impulses w/o bruits; no thyromegaly or nodules palpated; no lymphadenopathy.  RESP  Coarse BS w/ no crackles or wheezing noted.   CARD:  RRR, no m/r/g  , 1-2 + peripheral edema, pulses intact, no cyanosis or clubbing. Varicose veins noted.   GI:   Soft & nt; nml bowel sounds; no organomegaly or masses detected.  Musco: Warm bil, no deformities or joint swelling noted.   Neuro: alert, no focal deficits noted.    Skin: Warm, no lesions or rashes          Assessment & Plan:

## 2010-05-07 NOTE — Patient Instructions (Signed)
Finish Prednisone taper .  Extra Lasix 20mg  daily for 3 days then back to once daily .  Mucinex DM Twice daily  As needed  Cough/congestion Fluids and rest  Hydromet 1/2 tsp At bedtime  As needed  For cough-may make you sleepy.   Please contact office for sooner follow up if symptoms do not improve or worsen or seek emergency care   follow up 1 month Dr. [name]

## 2010-05-07 NOTE — Assessment & Plan Note (Signed)
Increased edema with venous insufficiency while on steroids Will treat with extra lasix.

## 2010-05-07 NOTE — Assessment & Plan Note (Signed)
Slow to resolve flare with fluid retention-slowly improving Will treat with some extra lasix for few days  Plan:  Finish Prednisone taper .  Extra Lasix 20mg  daily for 3 days then back to once daily .  Mucinex DM Twice daily  As needed  Cough/congestion Fluids and rest  Hydromet 1/2 tsp At bedtime  As needed  For cough-may make you sleepy.   Please contact office for sooner follow up if symptoms do not improve or worsen or seek emergency care   follow up 1 month Dr. [name]

## 2010-05-12 ENCOUNTER — Encounter (HOSPITAL_BASED_OUTPATIENT_CLINIC_OR_DEPARTMENT_OTHER): Payer: Medicare Other | Admitting: Oncology

## 2010-05-12 ENCOUNTER — Other Ambulatory Visit: Payer: Self-pay | Admitting: Oncology

## 2010-05-12 ENCOUNTER — Other Ambulatory Visit: Payer: Self-pay | Admitting: Internal Medicine

## 2010-05-12 DIAGNOSIS — D649 Anemia, unspecified: Secondary | ICD-10-CM

## 2010-05-12 DIAGNOSIS — D709 Neutropenia, unspecified: Secondary | ICD-10-CM

## 2010-05-12 DIAGNOSIS — C8588 Other specified types of non-Hodgkin lymphoma, lymph nodes of multiple sites: Secondary | ICD-10-CM

## 2010-05-12 DIAGNOSIS — D61818 Other pancytopenia: Secondary | ICD-10-CM

## 2010-05-12 DIAGNOSIS — D539 Nutritional anemia, unspecified: Secondary | ICD-10-CM

## 2010-05-12 DIAGNOSIS — C8299 Follicular lymphoma, unspecified, extranodal and solid organ sites: Secondary | ICD-10-CM

## 2010-05-12 LAB — CBC WITH DIFFERENTIAL/PLATELET
BASO%: 0.3 % (ref 0.0–2.0)
Basophils Absolute: 0 10*3/uL (ref 0.0–0.1)
HCT: 31 % — ABNORMAL LOW (ref 38.4–49.9)
HGB: 10.8 g/dL — ABNORMAL LOW (ref 13.0–17.1)
MONO#: 0.1 10*3/uL (ref 0.1–0.9)
NEUT%: 58 % (ref 39.0–75.0)
WBC: 2.5 10*3/uL — ABNORMAL LOW (ref 4.0–10.3)
lymph#: 1 10*3/uL (ref 0.9–3.3)

## 2010-05-28 ENCOUNTER — Ambulatory Visit (INDEPENDENT_AMBULATORY_CARE_PROVIDER_SITE_OTHER): Payer: Medicare Other | Admitting: *Deleted

## 2010-05-28 ENCOUNTER — Telehealth: Payer: Self-pay | Admitting: Internal Medicine

## 2010-05-28 DIAGNOSIS — I495 Sick sinus syndrome: Secondary | ICD-10-CM

## 2010-05-28 MED ORDER — POTASSIUM CHLORIDE CRYS ER 20 MEQ PO TBCR: 20.0000 meq | EXTENDED_RELEASE_TABLET | Freq: Every day | ORAL | Status: DC

## 2010-05-28 MED ORDER — SULINDAC 200 MG PO TABS: 200.0000 mg | ORAL_TABLET | Freq: Two times a day (BID) | ORAL | Status: DC | PRN

## 2010-05-28 NOTE — Progress Notes (Signed)
Pacer check in clinic  

## 2010-05-28 NOTE — Telephone Encounter (Signed)
Refills sent to specified pharmacies. LMOM to notify patient.

## 2010-05-30 ENCOUNTER — Other Ambulatory Visit: Payer: Self-pay | Admitting: Internal Medicine

## 2010-06-03 ENCOUNTER — Ambulatory Visit (INDEPENDENT_AMBULATORY_CARE_PROVIDER_SITE_OTHER): Payer: Medicare Other | Admitting: Adult Health

## 2010-06-03 ENCOUNTER — Encounter: Payer: Self-pay | Admitting: Adult Health

## 2010-06-03 VITALS — BP 120/70 | HR 112 | Temp 98.6°F | Ht 71.0 in | Wt 264.6 lb

## 2010-06-03 DIAGNOSIS — J209 Acute bronchitis, unspecified: Secondary | ICD-10-CM

## 2010-06-03 MED ORDER — PREDNISONE 10 MG PO TABS: ORAL_TABLET | ORAL | Status: AC

## 2010-06-03 MED ORDER — ALBUTEROL SULFATE HFA 108 (90 BASE) MCG/ACT IN AERS: 2.0000 | INHALATION_SPRAY | Freq: Four times a day (QID) | RESPIRATORY_TRACT | Status: DC | PRN

## 2010-06-03 MED ORDER — BENZONATATE 200 MG PO CAPS: 200.0000 mg | ORAL_CAPSULE | Freq: Three times a day (TID) | ORAL | Status: DC | PRN

## 2010-06-03 MED ORDER — MOMETASONE FUROATE 50 MCG/ACT NA SUSP: 2.0000 | Freq: Two times a day (BID) | NASAL | Status: DC

## 2010-06-03 MED ORDER — LEVOFLOXACIN 500 MG PO TABS: 500.0000 mg | ORAL_TABLET | Freq: Every day | ORAL | Status: AC

## 2010-06-03 MED ORDER — LEVALBUTEROL HCL 0.63 MG/3ML IN NEBU: 0.6300 mg | INHALATION_SOLUTION | Freq: Once | RESPIRATORY_TRACT | Status: DC

## 2010-06-03 NOTE — Patient Instructions (Addendum)
Levaquin 500mg  daily for 7 days  Mucinex DM Twice daily  As needed  Cough/congestion  Prednisone taper over next week.  Hold sulindac while on prednisone.  Fluids and rest Tessalon 200mg  Three times a day  As needed   follow up Dr. [name]  In 2 weeks and As needed   Please contact office for sooner follow up if symptoms do not improve or worsen or seek emergency care

## 2010-06-06 ENCOUNTER — Telehealth: Payer: Self-pay | Admitting: Internal Medicine

## 2010-06-06 ENCOUNTER — Other Ambulatory Visit (INDEPENDENT_AMBULATORY_CARE_PROVIDER_SITE_OTHER): Payer: Medicare Other

## 2010-06-06 ENCOUNTER — Other Ambulatory Visit: Payer: Self-pay | Admitting: Oncology

## 2010-06-06 ENCOUNTER — Encounter: Payer: Medicare Other | Admitting: Internal Medicine

## 2010-06-06 ENCOUNTER — Ambulatory Visit (INDEPENDENT_AMBULATORY_CARE_PROVIDER_SITE_OTHER): Payer: Medicare Other | Admitting: Internal Medicine

## 2010-06-06 ENCOUNTER — Encounter: Payer: Self-pay | Admitting: Internal Medicine

## 2010-06-06 ENCOUNTER — Encounter (HOSPITAL_BASED_OUTPATIENT_CLINIC_OR_DEPARTMENT_OTHER): Payer: Medicare Other | Admitting: Oncology

## 2010-06-06 VITALS — BP 124/78 | HR 103 | Temp 98.4°F | Ht 71.0 in | Wt 256.0 lb

## 2010-06-06 DIAGNOSIS — J309 Allergic rhinitis, unspecified: Secondary | ICD-10-CM

## 2010-06-06 DIAGNOSIS — IMO0002 Reserved for concepts with insufficient information to code with codable children: Secondary | ICD-10-CM

## 2010-06-06 DIAGNOSIS — R0609 Other forms of dyspnea: Secondary | ICD-10-CM

## 2010-06-06 DIAGNOSIS — D649 Anemia, unspecified: Secondary | ICD-10-CM

## 2010-06-06 DIAGNOSIS — E1365 Other specified diabetes mellitus with hyperglycemia: Secondary | ICD-10-CM

## 2010-06-06 DIAGNOSIS — R609 Edema, unspecified: Secondary | ICD-10-CM

## 2010-06-06 DIAGNOSIS — R059 Cough, unspecified: Secondary | ICD-10-CM

## 2010-06-06 DIAGNOSIS — R05 Cough: Secondary | ICD-10-CM

## 2010-06-06 DIAGNOSIS — C8588 Other specified types of non-Hodgkin lymphoma, lymph nodes of multiple sites: Secondary | ICD-10-CM

## 2010-06-06 LAB — BASIC METABOLIC PANEL
CO2: 31 mEq/L (ref 19–32)
Calcium: 9.5 mg/dL (ref 8.4–10.5)
GFR: 72.23 mL/min (ref >60.00)
Sodium: 136 mEq/L (ref 135–145)

## 2010-06-06 LAB — HEPATIC FUNCTION PANEL
AST: 23 U/L (ref 0–37)
Total Bilirubin: 0.8 mg/dL (ref 0.3–1.2)

## 2010-06-06 LAB — CBC WITH DIFFERENTIAL/PLATELET
Basophils Absolute: 0 10*3/uL (ref 0.0–0.1)
EOS%: 0 % (ref 0.0–7.0)
HCT: 29.9 % — ABNORMAL LOW (ref 38.4–49.9)
HGB: 10.5 g/dL — ABNORMAL LOW (ref 13.0–17.1)
LYMPH%: 18.4 % (ref 14.0–49.0)
MCH: 36.1 pg — ABNORMAL HIGH (ref 27.2–33.4)
MCV: 102.7 fL — ABNORMAL HIGH (ref 79.3–98.0)
MONO%: 2.8 % (ref 0.0–14.0)
NEUT%: 78.8 % — ABNORMAL HIGH (ref 39.0–75.0)
Platelets: 98 10*3/uL — ABNORMAL LOW (ref 140–400)
lymph#: 0.4 10*3/uL — ABNORMAL LOW (ref 0.9–3.3)

## 2010-06-06 LAB — BRAIN NATRIURETIC PEPTIDE: Pro B Natriuretic peptide (BNP): 351 pg/mL — ABNORMAL HIGH (ref 0.0–100.0)

## 2010-06-06 MED ORDER — TRAMADOL HCL 50 MG PO TABS: ORAL_TABLET | ORAL | Status: DC

## 2010-06-06 NOTE — Progress Notes (Signed)
Subjective:    Patient ID: [name], male    DOB: [birth year], 77 y.o.   MRN: [id number]  HPI  42 yowm quit smoking 1970 with morbid obesity with boderline DM, Lymphoma s/p chemo ([facility]) and tendency to peripheral edema that is felt to be dependent.   April 24, 2009--Returns for post hospital follow up. Admitted 04/11/2009- 04/15/2009 for CAP and leukopenia. CX were neg. CXR showed LLL infiltrate. He was tx w/ IV abx and aggressive pulmonary hygiene. He is doing much better w/ decreased dyspnea/cough. Has finished antibiotics. Is using flutter valve few times a day. Denies chest pain, orthopnea, hemoptysis, fever, n/v/d, edema, headache. His wbc did trend down during admission wbc 4.3>>1.9. Dr Clelia Croft following.   May 08, 2009 2 wk followup. Pt states that chest congestion is improving as well as his breathing. He states that he still feels very fatigued, still needing proaire up to 4 x daily whereas as previously only 4 x weekly. no purulent sputum. rx prednsione x 6 days> resolved   May 21, 2009 Acute visit c/o rash on left arm and left side onset April 22 Pt states rash is very itchy and sometimes painful. rec lotrisone   August 06, 2009 cc for the past month he is unable to walk at a brisk pace without getting out of breath. He still has dry cough "nothing new". very inactive no longer doing treadmill. no problem at night or sitting still. nasal congestion but not using nasonex as per calendar.   August 21, 2009--ov/ NP med review. pt brought all meds with him today. no new complaints. He is doing well. We reviewed his meds and organized them into med calendar. CXR last visit w/ no acute findings. Labs showed persistent pancytopenia. Has follow up w Dr. Frederich Chick > not due until Jan 2012   October 29, 2009 Followup. Pt c/o cough x 5 days- prod with minimal green sputum. Also he c/o sneezing. He has noticed some wheezing with exertion and also has had slight increase in SOB since  cough started. now needing saba whereas prev did not need it. Rx Doxycycline, steroid taper.    March 26, 2010 ov cc doe occ uses proaire, avg once every other day , no noct need, no purulent sputum.    >>no changes   04/30/10  Acute OV--Complains of chest congestion, dry cough, increased SOB, wheezing, x4days and fever 2 days ago. went to [facility] and ER on 04-28-10, was given neb tx only. CXR w/ no acute process, cbc unchanged. No UTI on urine. Pt says his cough started 4 days ago with barking cough to point of vomitting. Very hard to get up mucus. Started on Mucinex DM without much help. Has had fever-tmax 101.4. Daughter is with him today. He is eating and drinking well. No n/v/d, abdominal pain, urinary symptoms. Feels very worn out. Low energy.  Seen in ER but was discharged home without rx. He is no better today with persistent cough. No increased leg swelling. >>Dx with Bronchitis-tx w/ Avelox and Depo Medrol .   05/07/2010 ov f/u seen in ER on 04/28/10  with CXR neg for acute process. He was treated at our office on 4/4 with Avelox and Depo medrol . Did have persistent symptoms and called in on 4/7 -was called in steroid taper. He is slowly turning corner. He feels better but still weak, low energy. His cough/congestion are better.  Levaquin 500mg  daily for 7 days  Mucinex DM Twice daily  As  needed  Cough/congestion  Prednisone taper over next week.  Hold sulindac while on prednisone.  Fluids and rest Tessalon 200mg  Three times a day  As needed   follow up Dr. [name]  In 2 weeks and As needed   06/06/2010 ov/ Kara Mierzejewski cough has never really resolved since Jan 2012 some better on prednsione, ever since the winter, worse in am p wakes up > white mucus.  Assoc some nasal congestion and ok cxr on 4/2 x for HH.  On pred taper since 5/8 and levaquin.    Has lost calendar and seems confused when shown our copy, even with help of daughter.  No purulent sputum. No resting symtoms. Pt denies any significant sore  throat, dysphagia, itching, sneezing,  nasal congestion or excess/ purulent secretions,  fever, chills, sweats, unintended wt loss, pleuritic or exertional cp, hempoptysis, orthopnea pnd..    Also denies any obvious fluctuation of symptoms with weather or environmental changes or other aggravating or alleviating factors.  Leg swelling better on double dose furosemide.    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 04/2005.......[facility]  - last chemo 10/08  -repeat CT/ABD CT 11/18/07--no reccurence  - Pancytopenia August 06, 2009 > refer back to [facility] > aranesp rx Jan 2012  RHINITIS, ALLERGIC NOS (ICD-477.9)  HYPERLIPIDEMIA NEC/NOS (ICD-272.4)  EXOGENOUS OBESITY (ICD-278.00)  - ideal body weight less than 186  AODM onset 2012 with freq pred for airways issues Vitamin D Deficiency- level 29>>44 (4/9//10)  Left Hip pain onset around 6/09............................................................................Marland KitchenHiltz  - MRI 11/23/07 c/w L1-2 bulging disc indents the thecal sac with foraminal stenosis  HEALTH MAINTENANCE..........................................................................................Marland KitchenWert  - Td 10/2005  - Pneumovax 10/07 second shot  - CPX 11/08/07  --colon 02/2005 -int/extern. hems (repeat q7y)  Complex med regimen  --Meds reviewed with pt education and computerized med calendar completed/adjusted. November 08, 2008 , August 21, 2009  Syncope...........................................................................................................Marland KitchenHochrein  - Mobitz II AV block s/p Medtronic pacemaker placement 12/11/2008  D[facility]at[facility].....................................................................................................[facility]  - Pruritic rash 04/2009 > tried lotrisone, referred to [facility] August 06, 2009    Review of Systems     Objective:   Physical Exam wt 244 January 16, 2008 > 249 April 24, 2009 > 258 May 21, 2009 > 265 October 29, 2009 > 259 March 26, 2010 > 256 06/06/10 Ambulatory obese wm in no acute distress somewhat slowed mentation/ responses ? Depressed affect  HEENT: nl dentition, turbinates, and orophanx. Nl external ear canals without cough reflex  Neck without JVD/Nodes/TM  Lungs insp and exp rhonchi, mild to moderate, slt reduced air movement. RRR no s3 or murmur or increase in P2, no sign edema  Abd soft and benign with nl excursion in the supine position.  Ext warm without calf tenderness, cyanosis clubbing mild/mod chronic venous insufficiency changes      cxr 04/28/10 1.  No acute cardiopulmonary abnormalities. 2.  Cardiac enlargement 3.  Hiatal hernia   Assessment & Plan:

## 2010-06-06 NOTE — Telephone Encounter (Signed)
Called, spoke with pt's daughter, [name].  States pt is doing all recs from OV on 06/03/10 with TP but is worse since then.  States he is having increased SOB - using proair q6h, and he "sounds bad."  States he also has chest congestion, coughing spells with white and occas green mucus.  Pt states he is also having chills and sweats at times.  [name] was requesting pt have [facility] come out for nebs but would rather him come in if appt available.  Scheduled to see MW for today at 3:15 -- [facility] aware.

## 2010-06-06 NOTE — Patient Instructions (Addendum)
Think of your medications in 3 separate categories and keep them separate at home and when you return here.     1)  The ones you take no matter what daily on a scheduled basis.   These are the ones you put in pill boxes and take until a doctor adjusts them for you.  They are designed to help prevent problems or treat the cause of the problem.  They won't necessarily  make you feel immediately better,  so it's not possible for you to adjust them on your own.  2)  The ones you only take if needed for specific problems, stopping them once you feel better but restarting them if your problem returns.   THESE ARE HIGHLIGHTED IN YELLOW -   Let us know right away if they fail to control your problem to your satisfaction.  3)  The ones you take for a short course and stop, like antibiotics and prednisone. These are not to be refilled.   THESE ARE STARRED IN RED.    Finally, the list we made for you today is the best we could do but may not be perfect because it relies on your memory and our records.  PLEASE LET us KNOW RIGHT AWAY IF YOU FIND ERRORS ON IT AND WE WILL TRY TO CLARIFY AND CORRECT THE LIST GOING FORWARD,  KEEPING THIS LIST UPDATED AND ACCURATE IS CRITICAL TO KEEPING YOU HEALTHY.   See Tammy NP w/in 2 weeks with all your medications, even over the counter meds, separated in two separate bags, the ones you take no matter what vs the ones you stop once you feel better and take only as needed when you feel you need them.   Once you have seen Tammy and we are sure that we're all on the same page with your medication use she will arrange follow up with me.    LATE ADD Needs: Nutrition consult for wt loss, dm control Sinus CT PFT's  On return to [facility]

## 2010-06-07 NOTE — Assessment & Plan Note (Signed)
I emphasized that nasal steroids have no immediate benefit in terms of improving symptoms.  To help them reached the target tissue, the patient should use Afrin two puffs every 12 hours applied one min before using the nasal steroids.  Afrin should be stopped after no more than 5 days.  If the symptoms worsen, Afrin can be restarted after 5 days off of therapy to prevent rebound congestion from overuse of Afrin.  I also emphasized that in no way are nasal steroids a concern in terms of "addiction".  

## 2010-06-07 NOTE — Assessment & Plan Note (Signed)
Needs referral to nutrition rather than adding more medications to keep track of and avoid systemic steroids if possible

## 2010-06-07 NOTE — Assessment & Plan Note (Signed)
The most common causes of chronic cough in immunocompetent adults include the following: upper airway cough syndrome (UACS), previously referred to as postnasal drip syndrome (PNDS), which is caused by variety of rhinosinus conditions; (2) asthma; (3) GERD; (4) chronic bronchitis from cigarette smoking or other inhaled environmental irritants; (5) nonasthmatic eosinophilic bronchitis; and (6) bronchiectasis.   These conditions, singly or in combination, have accounted for up to 94% of the causes of chronic cough in prospective studies.   Other conditions have constituted no >6% of the causes in prospective studies These have included bronchogenic carcinoma, chronic interstitial pneumonia, sarcoidosis, left ventricular failure, ACEI-induced cough, and aspiration from a condition associated with pharyngeal dysfunction.   .Chronic cough is often simultaneously caused by more than one condition. A single cause has been found from 38 to 82% of the time, multiple causes from 18 to 62%. Multiply caused cough has been the result of three diseases up to 42% of the time.      Explained to pt and daughter:  The standardized cough guidelines recently published in Chest by [place] in 2006  are a multiple step process (up to 12!) , not a single office visit,  and are intended  to address this problem logically,  with an alogrithm dependent on response to empiric treatment at  each progressive step  to determine a specific diagnosis with  minimal addtional testing needed. Therefore if compliance is an issue or can't be accurately verified then it's very unlikely the standard evaluation and treatment will be successful here.    Furthermore, response to therapy (other than acute cough suppression, which should only be used short term with avoidance of narcotic containing cough syrups if possible), can be a gradual process for which the patient may not receive immediate benefit.  Unlike going to an eye doctor  where the right rx is almost always the first one and is immediately effective, this is almost never the case in the management of chronic cough syndromes and the patient needs to commit up front to compliance with recommendations and have the patience to wait out a response for up to 6 weeks of therapy directed at the likely underlying problem(s).    Therefore it's critical to maintain med reconciliation while working through this differential  The proper method of use, as well as anticipated side effects, of this metered-dose inhaler are discussed and demonstrated to the patient. This improved to 50% but no better ? Needs perforomist and budesonide?  Next steps in w/u should be pft's and sinus ct

## 2010-06-07 NOTE — Assessment & Plan Note (Signed)
Controlled with maint furosemide plus prn dose reviewed

## 2010-06-08 NOTE — Progress Notes (Signed)
Subjective:    Patient ID: GREEN QUINCY, male    DOB: March 22, 1932, 77 y.o.   MRN: [id number]  HPI 42 yowm quit smoking 1970 with morbid obesity with boderline DM, Lymphoma s/p chemo ([facility]) and tendency to peripheral edema that is felt to be dependent.   April 24, 2009--Returns for post hospital follow up. Admitted 04/11/2009- 04/15/2009 for CAP and leukopenia. CX were neg. CXR showed LLL infiltrate. He was tx w/ IV abx and aggressive pulmonary hygiene. He is doing much better w/ decreased dyspnea/cough. Has finished antibiotics. Is using flutter valve few times a day. Denies chest pain, orthopnea, hemoptysis, fever, n/v/d, edema, headache. His wbc did trend down during admission wbc 4.3>>1.9. Dr Clelia Croft following.   May 08, 2009 2 wk followup. Pt states that chest congestion is improving as well as his breathing. He states that he still feels very fatigued, still needing proaire up to 4 x daily whereas as previously only 4 x weekly. no purulent sputum. rx prednsione x 6 days> resolved   May 21, 2009 Acute visit c/o rash on left arm and left side onset April 22 Pt states rash is very itchy and sometimes painful. rec lotrisone   August 06, 2009 cc for the past month he is unable to walk at a brisk pace without getting out of breath. He still has dry cough "nothing new". very inactive no longer doing treadmill. no problem at night or sitting still. nasal congestion but not using nasonex as per calendar.   August 21, 2009--Presents for follow up and med review. pt brought all meds with him today. no new complaints. He is doing well. We reviewed his meds and organized them into med calendar. CXR last visit w/ no acute findings. Labs showed persistent pancytopenia. Has follow up w Dr. Frederich Chick > not due until Jan 2012   October 29, 2009 Followup. Pt c/o cough x 5 days- prod with minimal green sputum. Also he c/o sneezing. He has noticed some wheezing with exertion and also has had slight increase  in SOB since cough started. now needing saba whereas prev did not need it. Rx Doxycycline, steroid taper.    March 26, 2010 ov cc doe occ uses proaire, avg once every other day , no noct need, no purulent sputum.    >>no changes   04/30/10  Acute OV--Complains of chest congestion, dry cough, increased SOB, wheezing, x4days and fever 2 days ago. went [facility] ER on 04-28-10, was given neb tx only. CXR w/ no acute process, cbc unchanged. No UTI on urine. Pt says his cough started 4 days ago with barking cough to point of vomitting. Very hard to get up mucus. Started on Mucinex DM without much help. Has had fever-tmax 101.4. Daughter is with him today. He is eating and drinking well. No n/v/d, abdominal pain, urinary symptoms. Feels very worn out. Low energy.  Seen in ER but was discharged home without rx. He is no better today with persistent cough. No increased leg swelling. >>Dx with Bronchitis-tx w/ Avelox and Depo Medrol .   4/11//2012 Follow Up  Returns today for follow up . Seen last week for acute bronchitic symptoms. Was seen in ER on 04/28/10  with CXR neg for acute process. He was treated at our office on 4/4 with Avelox and Depo medrol . Did have persistent symptoms and called in on 4/7 -was called in steroid taper. He is slowly turning corner. He feels better but still weak, low energy. His  cough/congestion are better . No fevers. Good appetite. His legs are more swollen than usual. No chest pain or hemoptysis. Using mucinex dm and has few days left of steroid. >>no changes   06/03/10 Acute OV  Pt presents for an acute office visit. Complains of  1 week of increased cough and congestion. Mucus is very thick . HE is getting worse with exertion even bending over, cough hard to get mucus up, thick white when it does come up. Has some chills at times. NO energy. Does not feel good. No increased leg swelling.  Appetite is okay. OTC not helping. Has similar symptoms 6 weeks ago, they resolved and he  returned to his baseline.     Review of Systems   Constitutional:   No  weight loss, night sweats,    HEENT:   No headaches,  Difficulty swallowing,  Tooth/dental problems, or  Sore throat,                No sneezing, itching, ear ache, nasal congestion, post nasal drip,   CV:  No chest pain,  Orthopnea, PND,  anasarca, dizziness, palpitations, syncope.   GI  No heartburn, indigestion, abdominal pain, nausea, vomiting, diarrhea, change in bowel habits, loss of appetite, bloody stools.   Resp: + wheezing.  No chest wall deformity  Skin: no rash or lesions.  GU: no dysuria, change in color of urine, no urgency or frequency.  No flank pain, no hematuria   MS:  No joint pain or swelling.  No decreased range of motion.  No back pain.  Psych:  No change in mood or affect. No depression or anxiety.  No memory loss.     Objective:   Physical Exam  GEN: A/Ox3; pleasant , NAD, well nourished , elderly male   HEENT:  [place]/AT,  EACs-clear, TMs-wnl, NOSE-clear, THROAT-clear, no lesions, no postnasal drip or exudate noted.   NECK:  Supple w/ fair ROM; no JVD; normal carotid impulses w/o bruits; no thyromegaly or nodules palpated; no lymphadenopathy.  RESP  Coarse BS w/ few rhonchi noted.    CARD:  RRR, no m/r/g  , tr -1 + peripheral edema, pulses intact, no cyanosis or clubbing. Varicose veins noted.   GI:   Soft & nt; nml bowel sounds; no organomegaly or masses detected.  Musco: Warm bil, no deformities or joint swelling noted.   Neuro: alert, no focal deficits noted.    Skin: Warm, no lesions or rashes          Assessment & Plan:

## 2010-06-08 NOTE — Assessment & Plan Note (Signed)
Recurrent flare  Plan Levaquin 500mg  daily for 7 days  Mucinex DM Twice daily  As needed  Cough/congestion  Prednisone taper over next week.  Hold sulindac while on prednisone.  Fluids and rest Tessalon 200mg  Three times a day  As needed   follow up Dr. [name]  In 2 weeks and As needed   Please contact office for sooner follow up if symptoms do not improve or worsen or seek emergency care

## 2010-06-09 ENCOUNTER — Telehealth: Payer: Self-pay | Admitting: Internal Medicine

## 2010-06-09 ENCOUNTER — Encounter: Payer: Medicare Other | Admitting: Internal Medicine

## 2010-06-09 NOTE — Telephone Encounter (Signed)
Done, spoke with pt and informed of cxr results. Pt verbalized understanding.

## 2010-06-09 NOTE — Progress Notes (Signed)
Quick Note:  Spoke with pt and notified of results per Dr. Wert. Pt verbalized understanding and denied any questions.  ______ 

## 2010-06-09 NOTE — Telephone Encounter (Signed)
Returning Leslie's call.Darletta Moll

## 2010-06-10 ENCOUNTER — Other Ambulatory Visit: Payer: Self-pay | Admitting: Internal Medicine

## 2010-06-10 NOTE — Assessment & Plan Note (Signed)
[facility]                             PULMONARY OFFICE NOTE   [name], [name]                     MRN:          [id number]  DATE:06/14/2006                            DOB:          07-15-1932    HISTORY OF PRESENT ILLNESS:  This is a 77 year old white male patient of  Dr. [name] who has a known history of morbid obesity, gastroesophageal  reflux, and chronic peripheral edema, who presents today for acute  office visit complaining of a 5-day history of right leg and knee pain.  The patient complains that he has been having intermittent pain from the  right hip down into the right knee area.  The patient reports he does do  quite a bit of lifting at home.  He has a wife who is ill and has to try  to lift her quite a bit.  The patient denies any known injury, chest  pain, shortness of breath, calf pain.   PAST MEDICAL HISTORY:  Reviewed.   CURRENT MEDICATIONS:  Reviewed.   PHYSICAL EXAMINATION:  GENERAL:  The patient is a very pleasant male in  no acute distress.  VITAL SIGNS:  He is afebrile with stable vital signs.  O2 saturation 96%  on room air.  HEENT:  Unremarkable.  NECK:  Supple without cervical adenopathy.  No JVD.  CHEST:  Lung sounds are clear.  CARDIAC:  Regular rate.  ABDOMEN:  Soft and nontender.  EXTREMITIES:  Warm without any calf tenderness, cyanosis, clubbing, or  edema.  MUSCULOSKELETAL:  Hip range of motion is normal without any reproducible  symptoms.  The patient does have tenderness along the right lumbosacral  region.  Negative straight leg raises.  Right knee exam is unremarkable.  SKIN:  Warm without rash.   IMPRESSION AND PLAN:  1. Right lumbosacral strain and right leg pain. The patient is to use      Advil 600 mg 3 times a day x5-7 days with food.  May use Vicodin as      needed for severe pain.  Alternate ice and heat to the area.  The      patient will return here in two weeks for followup.  If symptoms   are      not improved, may need x-rays and/or referral to orthopedics.  The      patient is to call sooner for followup if symptoms worsen.      Rubye Oaks, NP  Electronically Signed      [name]. [name], MD, [facility]  Electronically Signed   TP/MedQ  DD: 06/16/2006  DT: 06/16/2006  Job #: [id number]

## 2010-06-10 NOTE — Assessment & Plan Note (Signed)
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 He recently saw Dr.  Delford Field on May 23, with  increasing pain in the right groin and underwent a CT scan as well as x-  rays of the right hip which were unremarkable (improvement in adenopathy  as compared to previous studies). He was given a prescription for  hydrocodone and recommended also that he take Advil and he returns today  all smiles with no significant pain in the right hip. He denies any  fevers, chills or sweats or change in bowel or bladder habits.   For full inventory of medications, please see face sheet dated July 26, 2006.   PHYSICAL EXAMINATION:  He is an obese, ambulatory, white male in no  acute distress. His weight is 245 pounds. No change in baseline.  HEENT: Is unremarkable. Oropharynx is clear.  LUNGS: Lung fields are completely clear bilaterally to auscultation and  percussion.  HEART: Regular rate and rhythm without murmur, gallop or rub.  ABDOMEN: Soft, benign.  EXTREMITIES: Warm without calf tenderness, cyanosis, clubbing or edema.   IMPRESSION:  Resolution of right hip pain with combination of Advil and  hydrocodone. I have advised him that I prefer he try Advil first before  resorting to hydrocodone emphasizing that hydrocodone simply attenuates  the pain, but Advil may treat inflammation in the right hip. If on the  other hand, taking regular Advil plus p.r.n. hydrocodone does not  control the pain clearly then he needs to see his orthopedist of record  (previously was Dr.  Criss Alvine) so would be Dr.  Alric Ran  group.   The other issue is one of morbid obesity. The patient has yet to address  in a consistent fashion. I have recommended a followup here in three  months for a comprehensive healthcare evaluation for this purpose and  reviewed with him caloric balance guidelines today.     [name]. [name], MD, [facility]  Electronically Signed    MBW/MedQ  DD: 07/26/2006  DT: 07/26/2006  Job #: [id number]   cc:   Blenda Nicely. [employer]

## 2010-06-10 NOTE — Assessment & Plan Note (Signed)
[facility]                             PULMONARY OFFICE NOTE   DARCELL, Victor                     MRN:          [id number]  DATE:06/18/2006                            DOB:          06/29/1932    [name] is a 77 year old male with a history of morbid obesity,  tendency to chronic edema, poorly controlled rhinitis who comes in today  with complaints of right-sided leg pain and hip pain. He was seen by the  nurse practitioner previously on Jun 14, 2006. She thought he had right  lumbosacral strain with right leg pain and she gave him Advil 600 mg 3  times daily for 7 days, Vicodin as needed, alternate ice and heat to the  area. If not improved, she suggested a chest x-ray be obtained. Today he  returns with symptoms that are persisting.   PHYSICAL EXAMINATION:  VITAL SIGNS:  Temperature 97, blood pressure  138/90, pulse 93, saturation 95% on room air.  CHEST:  Showed to be clear.  CARDIAC:  Unremarkable.  ABDOMEN:  Benign.  EXTREMITIES:  The right lower extremity has full range of motion both in  the hip and knee. There is no evidence of edema, clubbing or venous  disease. There is no heat or warmth to the right leg. No other  significant abnormalities are seen.   Reviewing the patient's chart, it is apparent that the patient has a  history of follicular cell type lymphoma with retroperitoneal  involvement in the past. He had 6 cycles of CHOP which he completed in  November 2007. Last CT did show residual retroperitoneal adenopathy in  February of this year. He had a grade 3, stage IIIA follicular lymphoma.   IMPRESSION:  Right-sided hip pain and leg pain. I am concerned there may  be recurrence of lymphoma causing this symptom complex.   PLAN:  Obtain a CT of the pelvis and abdomen early and AP and lateral x-  rays of the right hip. We will obtain these studies today and follow  with the patient with the results.     Charlcie Cradle  Delford Field, MD, [facility]  Electronically Signed    PEW/MedQ  DD: 06/18/2006  DT: 06/18/2006  Job #: [id number]   cc:   [name]. [name], MD, FCCP

## 2010-06-10 NOTE — Assessment & Plan Note (Signed)
[facility]                             PULMONARY OFFICE NOTE   [name], [name]                     MRN:          [id number]  DATE:12/17/2006                            DOB:          1932-09-02    HISTORY OF PRESENT ILLNESS:  Patient is a 77 year old white male patient  of Dr. Thurston Hole who has a known history of large follicular cel lymphoma,  diagnosed in 2007, associated with pain and swelling of the right groin.  Patient has presented in May of this year with right hip and leg pain.  Patient underwent a CT scan as well as right hip x-rays, which were both  unremarkable and improvement in the adenopathy as compared to previous  studies.  Patient was treated with anti-inflammatories and hydrocodone.  The pain has  waxed and waned over the last five months with improvement  but never total resolution.   The patient presents today complaining that over the last two weeks, he  has had increased right hip and leg pain.  The patient complains of a  dull, aching sensation along the right hip and groin area with radiating  pain down into the right leg.  The patient has been using Advil and  Vicodin intermittently with some improvement.  The patient denies any  abdominal pain, chest pain, palpitations.  The patient has recently  underwent a stress Cardiolite for some atypical chest pain a few weeks  ago on November 18, 2006, which was negative for ischemia.   PAST MEDICAL HISTORY:  1. Morbid obesity.  2. Chronic rhinitis.  3. Chronic fatigue.  4. Colon polyps.  5. BPH.  6. Pericarditis, status post pericardectomy in June, 2000.  7. Large follicular cell lymphoma, followed by Dr. Clelia Croft at the      cancer center.   CURRENT MEDICATIONS:  1. Potassium 20 mEq daily.  2. Furosemide 20 mg daily.  3. Lexapro 10 mg daily.  4. Trazodone 150 mg nightly.  5. Citrucel daily.  6. Multivitamin daily.  7. Aspirin 81 mg daily.  8. Nasacort AQ 2 puffs  b.i.d.  9. Omeprazole 20 mg daily.  10.Metaclopramide 5 mg q.i.d.  11.Advil 200 mg p.r.n.  12.Hydrocodone p.r.n.  13.Albuterol p.r.n.   DRUG ALLERGIES:  No known drug allergies.   PHYSICAL EXAMINATION:  Patient is an elderly male in no acute distress.  He is afebrile with stable vital signs.  O2 saturation is 97% on room  air.  HEENT:  Unremarkable.  NECK:  Supple without cervical adenopathy.  No JVD.  LUNGS:  The lung sounds are clear.  CARDIAC:  S1 and S2 without murmur, rub or gallop.  ABDOMEN:  Morbidly obese, soft, nontender.  No palpable  hepatosplenomegaly.  No rebound or guarding noted.  No abdominal bruits  or masses appreciated.  EXTREMITIES:  Warm without any calf cyanosis, clubbing, or edema.  Equal  strength in the upper and lower extremities.  DTRs are 2+ on the left,  1+ on the right.  Gait is normal.  Negative straight leg raises.  Internal and external rotation of the hip reproduces his  symptoms.  Normal.   IMPRESSION/PLAN:  Right hip and leg pain x5 months, questionable  etiology.  The symptoms have not totally resolved on anti-  inflammatories.  X-rays of the hips show degenerative changes.  Patient  is being referred to orthopedics for further evaluation and treatment  options.      Rubye Oaks, NP  Electronically Signed      [name]. [name], MD, [facility]  Electronically Signed   TP/MedQ  DD: 12/17/2006  DT: 12/17/2006  Job #: [id number]

## 2010-06-10 NOTE — Assessment & Plan Note (Signed)
[facility]                             PULMONARY OFFICE NOTE   [name], [name]                     MRN:          [id number]  DATE:11/15/2006                            DOB:          11/04/1932    HISTORY OF PRESENT ILLNESS:  The patient is a 77 year old male patient  of Dr. [name] with a known history of large follicular cell lymphoma  diagnosed in 2007 status post CHOP therapy, BPH, pericarditis status  post pericardectomy in June 2000, who presented today for 1-week  followup.  Last visit, the patient was having difficulty with a 68-month  history of intermittent anterior chest wall pain, right greater than  left.  The patient reports that this has improved over the last week.  He describes a burning sensation where he had previously had his port-a-  cath.  The patient does complain that symptoms in the past had worsened  with activity at times.  The patient has been recommended to be set up  for a stress Cardiolite.  Previous stress Cardiolite in 2000 and 2005  showed no evidence of ischemia with normal wall motion and normal  ejection fraction.  The patient has brought all of his medication in  today, reviewed, and are correct with our medication list.   PAST MEDICAL HISTORY:  Reviewed.   CURRENT MEDICATIONS:  Reviewed.   PHYSICAL EXAMINATION:  GENERAL:  The patient is a pleasant male in no  acute distress.  VITAL SIGNS:  Afebrile with stable vital signs.  O2 saturation 96% on  room air.  HEENT:  Unremarkable.  NECK:  Supple without cervical adenopathy.  No JVD.  LUNGS:  Lung sounds are clear to auscultation.  CARDIAC:  Regular rate and rhythm without murmur, rub, or gallop.  ABDOMEN: Soft, nontender.  No palpable hepatosplenomegaly.  EXTREMITIES:  Warm without any calf tenderness, cyanosis, clubbing, or  edema.   IMPRESSION AND PLAN:  1. Atypical chest wall pain, questionable etiology.  The patient's EKG      at last visit had shown  some T wave inversions laterally but more      prominent than on previous EKG.  The patient does have increased      risk factors and has been set up for a stress Cardiolite on November 18, 2006.  The patient will follow back up with Dr. [name] following      this.  The patient has been advised if chest pain returns or      increases, he is to contact our office sooner or seek emergency      room attention.  2. Morbid obesity complicated by borderline diabetes.  The patient's      TSH was repeated today.  3. Complex medication regimen.  The patient's computerized medication      calendar was      completed for this patient and reviewed in detail.  4. The patient will return back with Dr. [name] as scheduled in 4 weeks      or sooner if needed.      Rubye Oaks, NP  Electronically Signed      [name]. [name], MD, [facility]  Electronically Signed   TP/MedQ  DD: 11/16/2006  DT: 11/16/2006  Job #: [id number]

## 2010-06-10 NOTE — Assessment & Plan Note (Signed)
[facility]                             PULMONARY OFFICE NOTE   WRIGLEY, PLASENCIA                     MRN:          [id number]  DATE:11/08/2006                            DOB:          1932/05/29    PRIMARY SERVICE/COMPREHENSIVE HEALTHCARE EVALUATION:  This is a 77-year-  old white male with new-onset chest pain that alternates sides,  typically anterior, more often on the right than left, typically  occurring for about 10 minutes, perhaps worse with exertion, but not  consistently so, and sometimes occurs sitting, but never lying down.  He  denies any pleuritic or positional sense to the pain and states it is  more of an ache than a knife-like sensation.  He has it maybe twice a  week.  There has been no increase in the intensity or duration of the  discomfort since hypertension noticed it 6 months ago.   The patient denies any associated nausea or associated dyspnea,  vomiting, diaphoresis, presyncope or palpitations or cough, or change in  bowel or bladder habits.  He does have dyspnea with exertion; there has  been no change over the last year and does not reproducibly cause chest  discomfort.   PAST MEDICAL HISTORY:  1. Morbid obesity with target weight of 186.  2. Chronic rhinitis with most recent sinus CT scan negative in 1999.  3. Chronic fatigue, previously responsive to trazodone.  4. Colon polyps, status post most recent colonoscopy in February 2007      showing diverticulosis and hemorrhoids.  5. BPH, previously followed by Dr. Elige Radon.  6. Status post removal of penile prosthesis by Dr. Patsi Sears in 2006.  7. Pericarditis, status post pericardectomy in June 2000.  8. 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   ALLERGIES:  None known.   MEDICATIONS:  Taken in detail on the worksheet and correct in the column  dates November 08, 2006.   SOCIAL  HISTORY:  He is a remote smoker and denies any cigarette or  excess alcohol use.  He reports his wife is sickly at home and he has  spent a lot of time helping take care of her.   REVIEW OF SYSTEMS:  Taken in detail on the worksheet and negative except  as outlined above.   PHYSICAL EXAMINATION:  GENERAL:  This is an obese, somber, but not  overtly depressed ambulatory white male in no acute distress.  He is afebrile with stable vital signs with a weight of 246 pounds,  which is no change from a year ago.  HEENT:  Unremarkable.  Oropharynx clear.  Dentition is intact.  Ear  canals are clear bilaterally.  Nasal turbinates normal.  Dentition is  intact.  Carotid upstrokes are brisk bilaterally without any bruits.  CHEST:  Completely clear bilaterally to auscultation and percussion.  CARDIAC:  There is a regular rate and rhythm without murmur, gallop or  rub without displacement of the PMI or tenderness over the chest wall.  He had full range of  motion in both shoulders with no pain reproduced.  There was also no pain reproduced on manipulation of the C-spine.  ABDOMEN:  Massively obese, otherwise benign.  There is no palpable  organomegaly, mass or tenderness.  EXTREMITIES:  Warm without calf tenderness, cyanosis, clubbing or edema.  Pedal pulses were present bilaterally.  GENITOURINARY:  Testes are descended bilaterally with no nodules.  RECTAL:  Revealed mild BPH, smooth texture.  Stool guaiac was negative.  EXTREMITIES:  Warm without calf tenderness, cyanosis, clubbing or edema.  He did have prominent varicose veins bilaterally with dorsalis pedis  pulses stronger than posterior tibial.  NEUROLOGIC:  No focal deficits or pathologic reflexes.  SKIN:  Warm and dry except for the varicose veins.   EKG:  T-wave inversions laterally which are a bit more prominent than on  previous EKGs, especially in lead II and III and V5 and 6.   Urinalysis is unremarkable.  Total cholesterol is 142  with an HDL of 27  and an LDL of 67.  CRP is normal.  BNP was normal.  CBC was normal.  Chemistry profile revealed a blood sugar of 121 with a BUN of 17 and  creatinine 1.1.   Chest x-ray revealed cardiomegaly with no failure.  Hiatal hernia  present.   IMPRESSION:  1. Atypical chest discomfort that is not reproduced by exercise, more      often present on the right than the left and in an unusual location      for angina, in that it is the upper chest.  However, he does have      an abnormal EKG and a history of pericarditis.  In this setting, I      believe it would be appropriate to at least screen him with an      exercise Cardiolite and I will make arrangements for this between      now and his next visit.  2. Follicular cell lymphoma, diagnosed in April 2007, status post CHOP      therapy completed in November 2007 with no evidence of recurrence      to date.  I do not believe the chest discomfort he is experiencing      has anything to do with this.  3. I do note for the record that he previously had atypical chest pain      that resolved on Reglan, but states he is taking this regularly as      well as Citrucel.  I believe that we will probably find out indeed      it is gastrointestinal in nature, but in the meantime, based on his      risk factors, would proceed with a stress Cardiolite.  4. Morbid obesity complicated by borderline diabetes with normal TSH      in the past, but not performed today.  We will need to therefore      repeat the TSH on his next visit.  5. Health maintenance:  He was updated on colonoscopy in February 2007      and received a flu      vaccination today.  Pneumovax was given in 2007 as well as tetanus.  6. Followup:  I plan to see the patient back in 6 weeks and will      arrange for a stress Cardiolite to be done in the meantime.     [name]. [name], MD, [facility]  Electronically Signed    MBW/MedQ  DD: 11/09/2006  DT:  11/10/2006  Job #:  [id number]

## 2010-06-13 NOTE — H&P (Signed)
[name], MANAS NO.:  [id number]   MEDICAL RECORD NO.:  [id number]          PATIENT TYPE:  INP   LOCATION:  1326                         FACILITY:  [facility]   PHYSICIAN:  Drue Second, MD       DATE OF BIRTH:  Sep 29, 1932   DATE OF ADMISSION:  10/11/2005  DATE OF DISCHARGE:                                HISTORY & PHYSICAL   REASON FOR ADMISSION:  A 77 year old gentleman with history of lymphoma,  being admitted with right groin  abscesses and cellulitis of the leg.   HISTORY OF PRESENT ILLNESS:  The patient is a very pleasant 77 year old  gentleman who was recently diagnosed in April 2007 with follicular lymphoma.  He initially presented to his primary care physician's office with  complaints of discomfort in the right groin area and had noticed a small  knot along the right groin in the upper thigh area.  He has also a 20-pound  weight loss.  Subsequent workup included a bilateral groin lymph node biopsy  done May 25, 2005.  The patient's pathology revealed a follicular  lymphoma, predominately diffuse grade 3 of three.  The biopsy of the left  groin also showed a follicular lymphoma, diffuse grade 3 of three.  The  patient thereafter had a bone marrow biopsy and aspirate performed as well.  The patient was seen by Dr. Eli Hose and was begun on systemic  chemotherapy consisting of CHOPR. He has completed a total of five cycles.  His cycle #6 was due on October 08, 2005.  However, due to anemia as per  the patient, the chemotherapy was held.  The patient on last Wednesday  started developing fevers to 100.2, 100.4.  He also complained of having  right leg and groin pain with some swelling and redness.  He was treated  with Augmentin.  However of over the week end, the patient continued to have  increasing pain as well as progression of redness, and he noticed that the  knot had doubled in size since Wednesday.  Because of this, he called me,  and he  was seen in the emergency room by me.   In the ED, the patient complained of again persistent pain with redness.  He, however, appeared nontoxic.  He did state that this groin lesion has now  progressed to a point where he does have some difficulty in walking.  He did  have a T-max of 102 at home.  He has been taking his Augmentin religiously  and has been taking pain medications religiously as well.  The patient  denies any nausea, vomiting, headaches, double vision, shortness of breath,  chest pain, palpitations, no night sweats.  He has some easy fatigability.  He has not had any problems with his urine.  No diarrhea.  No bleeding.   PAST MEDICAL HISTORY:  Significant for:  1. Hypertension.  2. Gastroesophageal reflux disease.  3. 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  4. Benign prostatic hypertrophy.  5. Hypoglycemia and dyslipidemia.  6. History  of colon polyps.  7. Chronic rhinitis.  8. Chronic fatigue syndrome.   FAMILY HISTORY:  Unremarkable for cancer.  However, the patient does have a  half-brother who was diagnosed with colon cancer and with metastases to the  liver.   SOCIAL HISTORY:  Patient is married.  He lives with his wife.  The patient  is retired from the tobacco industry.  He quit smoking.  He does not use  alcohol.  He has two children, and the patient lives in [place].   CURRENT MEDICATIONS:  Include Lasix, Vicodin, Advil, Imodium, multivitamins,  niacin, Prilosec, trazodone and Nasacort AQ, and all are written in his  orders.   ALLERGIES:  No known drug allergies.   PHYSICAL EXAMINATION:  GENERAL: The patient is awake, alert, here with his  son.  VITAL SIGNS:  In the emergency room, temperature 99.6, pulse 102,  respirations 20, blood pressure 130/78, O2 saturation 97% on room air.  HEENT: EOMI, PERRLA.  Sclerae is anicteric.  There is conjunctival pallor.  Oral mucosa is  moist.  NECK: Supple.  LUNGS: Clear bilaterally.  CARDIOVASCULAR: Regular rate and rhythm but tachycardiac.  ABDOMEN: Obese, nontender, nondistended.  Bowel sounds are present.  No  hepatosplenomegaly.  EXTREMITIES: +1 edema.  The right lower extremity reveals a large  fluctuating mass which is yellowish in appearance.  There is surrounding  erythema, and it is quite indurated. It is warm to touch.  Left groin  without any palpable masses or erythema.  NEUROLOGIC:  Patient is alert, oriented, otherwise nonfocal.   LABORATORY DATA:  WBC 6.6, hemoglobin 9.1, hematocrit 26.2, platelets  302,000.  Electrolytes are pending.   ASSESSMENT/PLAN:  A 77 year old gentleman with history of low-grade lymphoma  status post five cycles of systemic chemotherapy consisting of CHOPR, now  presenting with right groin abscess and cellulitis.  Plan is to admit the  patient for IV antibiotics.  The patient, prior to his antibiotics, will be  pancultured.  His antibiotics will consist of vancomycin and Primaxin.  I  will obtain a CT of the pelvis and thigh to better evaluate this right groin  mass as well as to rule out deep venous thrombosis.  I have spoken to Dr.  Derrell Lolling from the surgical service, and he will come and evaluate the patient  as well for this abscess.  All of the plans are discussed with the patient  as well as his son. Questions are answered.      Drue Second, MD  Electronically Signed     KK/MEDQ  D:  10/11/2005  T:  10/11/2005  Job:  [id number]   cc:   [name]. [name], MD, FCCP  [address]  [place]  [place] [address]   Gita Kudo, M.D.  [address]  [place]  [place] [address]   Blenda Nicely. [facility] M. Derrell Lolling, M.D.  [address], [address]  [place]  [place] [address]

## 2010-06-13 NOTE — Assessment & Plan Note (Signed)
[facility]                             PULMONARY OFFICE NOTE   KAYDENN, MCLEAR                     MRN:          [id number]  DATE:02/26/2006                            DOB:          04/06/1932    PRIMARY SERVICE ACUTE EXTENDED FOLLOWUP OFFICE VISIT:   HISTORY:  The patient is a 77 year old white male, newly-diagnosed with  lymphoma a year ago, comes in complaining of new-onset chest pain about  a month ago.  He said the pain is constantly present, although he  really does not notice much when he walks and also not when he sleeps.  It was gradual in onset and then has leveled off, mostly on the left,  but some on the right, all anterior in nature, and is not associated  with any dyspnea or diaphoresis.  He told my nurse that sometimes it  seems to be aggravated by eating, but denied telling her this.  In fact,  the more I asked the history the more vague he became in terms of any  specifics regarding the discomfort.  He says that he was just here for  a routine checkup and would not have made an appointment to see me for  the pain because it just did not bother him that much.  He denies any  solid food dysphagia.  He does admit that he has not been consistent  about using his Citrucel, as previously recommended.   For full list of current medications, please see face sheet dated  February 26, 2006.   The patient reports that his lymphoma is felt to be under good control  by most recent scans at the cancer center within the last month, not  available for review today.   PHYSICAL EXAMINATION:  This is an obese, ambulatory, white male, who is  easily confused regarding details of recent events and history.  He is  afebrile with normal vital signs.  HEENT:  Unremarkable.  Pharynx clear.  Lung fields are completely clear bilaterally to auscultation and  percussion.  There is regular rhythm without murmur, gallop or rub.  Abdomen is soft, benign.   Extremities are warm without calf tenderness,  cyanosis, clubbing or edema.   IMPRESSION:  Vague anterior chest discomfort, sometimes on the left,  sometimes on the right, sometimes related to eating, but never related  to breathing or walking.  It is most likely of a GI nature, either  reflux or irritable bowel syndrome.  I have emphasized the importance of  taking the omeprazole perfectly regularly and will add metoclopramide 5  mg before meals and at bedtime to his present regimen and then to see  him back in a month to see how he is doing.  In addition, I have  emphasized that he should take the Citrucel every day.  If his condition  worsens, or should he notice any pleuritic or exertional features, I  have asked him to contact me immediately.     [name]. [name], MD, [facility]  Electronically Signed    MBW/MedQ  DD: 02/26/2006  DT: 02/26/2006  Job #:  [id number] 

## 2010-06-13 NOTE — Assessment & Plan Note (Signed)
[name]                               PULMONARY OFFICE NOTE   KERRICK, MILER                     MRN:          [id number]  DATE:12/11/2005                            DOB:          10/19/1932    HISTORY OF PRESENT ILLNESS:  The patient is a 77 year old white male patient  of Dr. Thurston Hole who has a known history of chronic fatigue, chronic rhinitis,  BPH and follicular cell lymphoma followed by Dr. Clelia Croft at the [facility] Now finished chemotherapy. His last treatment was 2 weeks ago. The  patient presents for an acute office visit complaining of left index finger  swelling and redness x2 days. The patient noticed some itching along his  left index finger for the last couple of days and has been scratching it.  This morning when he awakened he noticed a small red nodule with redness  along most of the left finger and joint. The patient denies any arm pain,  chest pain, shortness of breath, recent travel or antibiotic use.   PAST MEDICAL HISTORY:  Reviewed.   CURRENT MEDICATIONS:  Reviewed.   PHYSICAL EXAMINATION:  GENERAL:  The patient is a pleasant elderly male in  no acute distress.  VITAL SIGNS:  He is afebrile with stable vital signs.  HEENT:  Unremarkable.  LUNGS:  Sounds are clear.  CARDIAC:  Regular rate.  ABDOMEN:  Soft and benign.  EXTREMITIES:  Warm with trace to 1+ edema. Along the left second digit was  noted redness measuring approximately 2 x 2 cm distal to the PIP joint.  Range of motion is normal. Pulses are intact.   IMPRESSION/PLAN:  Left second digit noted cellulitis. The patient is to use  warm compresses to the area. Begin Duricef 500 mg b.i.d. x10 days. The  patient will recheck here in 3-4 days for followup. Will need to monitor  closely. If not improving will need referral to the orthopedic hand  specialist. The patient is advised if symptoms worsen over the weekend or  swelling increases, he is to go to  the closest emergency department.     Rubye Oaks, NP  Electronically Signed      [name]. [name], MD, [facility]  Electronically Signed   TP/MedQ  DD: 12/11/2005  DT: 12/11/2005  Job #: [id number]

## 2010-06-13 NOTE — Consult Note (Signed)
[name], [name] NO.:  [id number]   MEDICAL RECORD NO.:  [id number]          PATIENT TYPE:  INP   LOCATION:  1326                         FACILITY:  [facility]   PHYSICIAN:  Angelia Mould. Derrell Lolling, M.D.DATE OF BIRTH:  [birth year]   DATE OF CONSULTATION:  10/11/2005  DATE OF DISCHARGE:                                   CONSULTATION   REFERRING PHYSICIAN:  Firas N. Clelia Croft, M.D.   REASON FOR CONSULTATION:  Evaluate right groin abscess and right leg  swelling.   HISTORY OF PRESENT ILLNESS:  This is a 77 year old, white male with a  history of hypertension, obesity, gastroesophageal reflux disease,  pericardiectomy for constrictive pericarditis in the year 2000, benign  prostatic hypertrophy, colon polyps and penile prosthesis.   He was diagnosed with follicular lymphoma on April3,2007, by Dr. Jerelene Redden who performed bilateral inguinal lymph node biopsies.  The patient  recovered from that surgery uneventfully with good wound healing.  He  received CHOP-R chemotherapy and has had five cycles, the last cycle being 3  weeks ago.  He has a 1-week history of right groin swelling pain, redness  and right thigh swelling and fevers to 102 at home.  He apparently was put  on oral antibiotics by Dr. Clelia Croft on Wednesday, September12, but there has  been no improvement.  He came to the emergency room and is being admitted by  Dr. Drue Second.   PAST HISTORY:  1. Hypertension.  2. Gastroesophageal reflux disease.  3. Obesity.  4. 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  5. Benign prostatic hypertrophy.  6. History of hypoglycemia.  7. History of dyslipidemia.  8. History of colon polyps.  9. History of chronic rhinitis.  10.History of penile prosthesis.  11.History of chronic fatigue syndrome.  12.History of bilateral lymph node biopsies in the groin.   CURRENT MEDICATIONS:  Augmentin, potassium  chloride, Lasix 20 mg, Lexapro,  trazodone, Citrucel, multivitamins, aspirin, Nasacort, omeprazole,  Robitussin, Tylenol, Advil and hydrocodone.   ALLERGIES:  No known drug allergies.   FAMILY HISTORY:  Unremarkable for malignancy, except for a brother who had  colon cancer with metastases to the liver.  No history of blood disorders.   SOCIAL HISTORY:  He is married.  Again, lives with his wife who is  chronically ill.  He is retired from the [employer].  He has a remote  smoking history.  Denies alcohol.  He has two children and number of  grandchildren.  He lives in [place] and is a native of [place].   REVIEW OF SYSTEMS:  All systems reviewed and are noncontributory except as  described above.   PHYSICAL EXAMINATION:  GENERAL:  Very pleasant, in fact jovial, overweight  gentleman in no significant distress.  VITAL SIGNS:  Temperature 97.7, heart rate 95, blood pressure 124/74,  respiratory rate 22, oxygen saturation 97% room air.  NECK:  No adenopathy.  No mass.  No jugular distension.  LUNGS:  Clear to auscultation.  No chest wall tenderness.  HEART:  Regular rate and rhythm.  No murmur.  Radial pulses are palpable.  ABDOMEN:  Somewhat obese, soft, nontender.  Liver and spleen not enlarged,  no palpable mass.  No hernias.  EXTREMITIES:  He has a very large, 15-20 cm abscess in his right groin with  erythema and fluctuance on the most anterior portion of this.  It is tender.  It extends all the way over to the scrotum, but does not involve the scrotum  or penis.  There is some erythema extending inferiorly down on the anterior  and medial thigh.  This extends about a third of the way down the thigh.  The lower thigh and calf are some swollen, but are nontender.  Denna Haggard' sign  is negative on the right.  Examination of the right foot reveals no ulcers  or skin breaks in the skin.  NEUROLOGIC:  No gross motor sensory deficits.   LABORATORY DATA AND X-RAY FINDINGS:   Hemoglobin 9.1, white count 6600,  platelet count 302,000.  Complete metabolic panel is normal including a  glucose of 135, creatinine of 0.9.  Prothrombin time 14.2 with INR of 1.1  and PTT of 37.   ASSESSMENT:  1. Right groin abscess.  I suspect this may be due to secondarily infected      necrotic right inguinal lymph nodes that were left behind, or perhaps      and infected lymphocele.  2. Right leg swelling.  Lymphedema is more likely than deep venous      thrombosis.  We may need to follow up with evaluation for deep venous      thrombosis soon, however.  3. Follicular lymphoma, status post chemotherapy, recent.  4. History of pericardiectomy for constrictive pericarditis.  5. Status post penile prosthesis.  6. Gastroesophageal reflux disease.  7. Hypertension  8. Benign prostatic hypertrophy.   RECOMMENDATIONS:  1. The patient will be admitted and will be started on broad-spectrum      antibiotics.  2. We are going to do a CT scan of the pelvis now to check the extent of      this abscess.  3. Following the CT scan, we will take him to the operating room for      incision and drainage of this abscess.      Angelia Mould. Derrell Lolling, M.D.  Electronically Signed     HMI/MEDQ  D:  10/11/2005  T:  10/12/2005  Job:  [id number]   cc:   Drue Second, MD  Fax: [phone]   [name]. [name], MD, FCCP  [address]  [place]  [place] [address]

## 2010-06-13 NOTE — Assessment & Plan Note (Signed)
[facility]                             PULMONARY OFFICE NOTE   DAVIDLEE, JEANBAPTISTE                     MRN:          [id number]  DATE:12/29/2005                            DOB:          July 23, 1932    HISTORY OF PRESENT ILLNESS:  This is a 77 year old white male patient of  Dr. Thurston Hole  who returns for a 2-week followup. The patient had recently  had a cellulitis of the left second finger. He was treated with a 10-day  course of Duricef, which he has now finished. The patient returns today  with significant improvement with almost total resolution of redness and  decreased pain and swelling. The patient denies any fever or pain.   PAST MEDICAL HISTORY:  Reviewed.   CURRENT MEDICATIONS:  Reviewed.   PHYSICAL EXAMINATION:  The patient is pleasant male in no acute  distress. He is afebrile with stable vital signs.  Lung sounds are clear.  CARDIAC: Regular rate.  EXTREMITIES: Reveal the left second digit is significantly improved with  almost total resolution of redness and swelling. The patient has a small  0.5 small area of pinkness slightly distal to the PIP joint. Pulses are  intact.   IMPRESSION/PLAN:  Left second digit cellulitis. Significantly improved  with antibiotic coverage. The patient will follow up in the office as  scheduled or sooner as needed.      Rubye Oaks, NP  Electronically Signed      [name]. [name], MD, [facility]  Electronically Signed   TP/MedQ  DD: 01/04/2006  DT: 01/04/2006  Job #: [id number]

## 2010-06-13 NOTE — Op Note (Signed)
NAMEAUBERT, CHOYCE NO.:  [id number]   MEDICAL RECORD NO.:  [id number]          PATIENT TYPE:  INP   LOCATION:  1326                         FACILITY:  [facility]   PHYSICIAN:  Angelia Mould. Derrell Lolling, M.D.DATE OF BIRTH:  25-Sep-1932   DATE OF PROCEDURE:  10/11/2005  DATE OF DISCHARGE:                                 OPERATIVE REPORT   PREOPERATIVE DIAGNOSIS:  Abscess right groin.   POSTOPERATIVE DIAGNOSIS:  Abscess right groin.   OPERATION PERFORMED:  Incision and drainage and pulsatile lavage,  debridement of right inguinal and right femoral abscess.   SURGEON:  Angelia Mould. Derrell Lolling, M.D.   OPERATIVE INDICATIONS:  This is a 77 year old white man who has follicular  lymphoma which was diagnosed by bilateral inguinal node biopsies in April of  2007.  This wound has healed.  He has had 5 cycles of chemotherapy, the last  cycle being given 3 weeks ago.  He presents with a 1-week history of  progressive swelling, pain and redness in the right groin.  On exam he has a  huge abscess in the right groin.  He has had a CT scan which shows this to  be a very large, but unilocular abscess.  He has had Doppler studies, today,  which shows no evidence of deep venous thrombosis in the thigh.  He is  brought to operating room urgently.   OPERATIVE TECHNIQUE:  Following the induction of general endotracheal  anesthesia; the patient's lower abdomen, right groin, thigh, and genitalia  were prepped and draped in a sterile fashion.  A 20 mL syringe and a large  gauge needle was used to aspirate some very cloudy, but watery fluid from  the abscess.  This was sent for aerobic, anaerobic, and fungal cultures.  After discussing with the laboratory personnel.   I then made a large somewhat transverse oblique incision in the right groin.  This was actually below the inguinal ligament; and opened up a huge abscess  which went all the way down to the femoral triangle.  We evacuated all the  purulent fluid.  We irrigated this out.  We then used as a pulsatile lavage  device; and used this to debride a lot of the inflammatory exudate on the  underlying tissues.  It appeared that the superficial femoral vein was  thrombosed and covered with inflammatory exudate.  There was no necrosis.  This did not have a foul odor.  This was well contained and encapsulated.  There was no evidence of fasciitis.  I felt that I had broken up all the  spaces where the abscess was.  Hemostasis was adequate; and achieved with  electrocautery.  The wound was packed with a saline moistened  Kerlix; and covered with dry gauze.  The patient tolerated the procedure  well; and was taken to the recovery room in stable condition.  Estimated  blood loss was about 25 mL.  Complications none.  Sponge, needle, and  instrument counts were correct.      Angelia Mould. Derrell Lolling, M.D.  Electronically Signed     HMI/MEDQ  D:  10/11/2005  T:  10/12/2005  Job:  [id number]   cc:   Blenda Nicely. [employer]

## 2010-06-13 NOTE — H&P (Signed)
[name], [name]              ACCOUNT NO.:  [id number]   MEDICAL RECORD NO.:  [id number]          PATIENT TYPE:  OIB   LOCATION:  1324                         FACILITY:  [facility]   PHYSICIAN:  [name], VictorD.DATE OF BIRTH:  June 29, 1932   DATE OF ADMISSION:  10/22/2005  DATE OF DISCHARGE:                                HISTORY & PHYSICAL   HISTORY OF PRESENT ILLNESS:  This is a 77 year old married white male  referred by Dr. Clelia [name] for evaluation of distal penile, glans swelling and  foreskin swelling and spraying of his urine for several days.  The patient's  past urologic history is significant for semi-rigid penile prosthesis  placement in 1988 with no complications.  Inspection in the office revealed  erosion of the left corpora cavernosa at the distal urethral margin, but it  could not be removed in the office.  The patient is now admitted via the  operating room for explantation of penile prosthesis.   PAST MEDICAL HISTORY:  His past history is significant for:  1. Follicular lymphoma, grade 3, stage IIIA, status post chemotherapy with      CHOP.  He is now in between his fifth and sixth chemotherapy course.  2. Anemia.  3. Abscess right groin, status post incision and drainage per [name].   ALLERGIES:  None known.   MEDICATIONS:  1. Aspirin 81 mg two per day.  2. Potassium 20 mEq per day.  3. Lasix 20 mg per day.  4. Lexapro 10 mg per day.  5. Trazodone 150 mg q.h.s.   REVIEW OF SYSTEMS:  Significant for spraying of urinary stream but otherwise  the patient denies any other review of systems.  Family History: Non-contributory   PHYSICAL EXAMINATION:  GENERAL APPEARANCE:  Admission physical examination  shows a well-developed, well-nourished white male in no acute distress.  VITAL SIGNS:  Temperature 97.6, heart rate 86, respiratory rate 18.  HEENT:  Pupils equal, round, reactive to light.  Extraocular movements full.  NECK:  Supple, no  nodes.  CHEST:  Clear to auscultation and percussion.  ABDOMEN:  Soft, positive bowel sounds without organomegaly, without masses.  GENITOURINARY:  Shows penis with swelling of the foreskin and the glans with  obvious extrusion of the left corporal penile prosthesis.  Urethra is  otherwise negative.  There is no pus and there is no erythema.  Scrotum is  normal.  Testicles measuring 4x4 cm and nontender.  There is a right groin  incision, which is clean.  It is healing by secondary intention.  EXTREMITIES:  Show no cyanosis or edema.  SKIN:  Otherwise normal to inspection and palpation.  PSYCHOLOGICAL:  Shows normal orientation to time, person and place.   IMPRESSION:  Extrusion of left corporal cavernosa penile prosthesis.   PLAN:  Removal of prosthesis.      [name], VictorD.  Electronically Signed     SIT/MEDQ  D:  10/22/2005  T:  10/23/2005  Job:  [id number]   cc:   [name]. [facility] [name], VictorD.  [phone]  [name][address]  [place]  [place] [address]

## 2010-06-13 NOTE — Op Note (Signed)
NAMENATIVIDAD, HALLS              ACCOUNT NO.:  [id number]   MEDICAL RECORD NO.:  [id number]          PATIENT TYPE:  AMB   LOCATION:  NESC                         FACILITY:  [facility]   PHYSICIAN:  Sigmund I. Patsi Sears, M.D.DATE OF BIRTH:  12/13/1932   DATE OF PROCEDURE:  12/21/2005  DATE OF DISCHARGE:                               OPERATIVE REPORT   PREOPERATIVE DIAGNOSES:  1. History of eroded penile prosthesis.  2. Phimosis.   POSTOPERATIVE DIAGNOSES:  1. History of eroded penile prosthesis.  2. Phimosis.   OPERATIONS:  1. Cystoscopy.  2. Circumcision.   SURGEON:  Sigmund I. Patsi Sears, M.D.   ASSISTANT:  Terie Purser, M.D.   ANESTHESIA:  General.   BLOOD LOSS:  Minimal.   COMPLICATIONS:  None.   DISPOSITION:  Stable to post anesthesia care unit.   INDICATIONS FOR PROCEDURE:  Mr. Virani is a 77 year old male who has a  history of an eroded penile prosthesis which was removed in September of  2007.  He is also having difficulty voiding secondary to phimosis.  The  patient was counseled regarding a cystoscopy to assess the urethra given  his history of the erosion as well as circumcision for phimosis.  The  benefits and risks were explained and his consent was obtained.   DESCRIPTION OF PROCEDURE:  The patient was brought to the operating room  and properly identified.  A time out was performed to confirm correct  patient and procedure.  He was administered general anesthesia and given  preoperative antibiotics and placed in the supine position, prepped and  draped in a sterile fashion.   A flexible cystoscopy was then performed.  The anterior urethra was  normal.  Posterior urethra was normal as well.  Examination of the  bladder revealed no mucosal abnormalities.  Both ureteral orifices were  identified in their normal anatomic position.  Specifically there was no  further evidence of injury from his erosion.   We then proceeded with circumcision.  The penile skin  was marked.  Circumcising incisions were made proximally and distally.  The skin was  removed in a sleeve technique.  Hemostasis was obtained with the Bovie.  The skin was then reapproximated in interrupted fashion with 4-0 Vicryl  sutures.  Dermabond was applied to the incision.  A mild  compression dressing was then placed.  The patient was then awoken from  anesthesia and transferred to the recovery room in stable condition.  No  complications.  Please note Dr. Patsi Sears was present and participated  in all aspects of this procedure.     ______________________________  Terie Purser, MD      Sigmund I. Patsi Sears, M.D.  Electronically Signed    JH/MEDQ  D:  12/21/2005  T:  12/21/2005  Job:  [id number]

## 2010-06-13 NOTE — Assessment & Plan Note (Signed)
[facility]                             PULMONARY OFFICE NOTE   DALLON, DACOSTA                     MRN:          [id number]  DATE:04/19/2006                            DOB:          1932-03-30    HISTORY:  This is a 77 year old white male with morbid obesity  complicated by tendency to chronic edema and also poorly controlled  rhinitis all of which have improved on present regimen. For full  inventory please see medication face sheet dated April 19, 2006.   The reason for his followup visit today is related to new onset chest  pain (see note dated February 26, 2006). I thought this was a typical  pain and most likely related to either reflux or irritable bowel  syndrome, recommended the addition of metoclopramide 5 mg before meals  and at bedtime to the omeprazole daily and Citrucel consistently, as  written on his medication calendar.   He says he has been consistent about taking the medicines and all of his  symptoms have completely resolved.  See comment dated April 19, 2006 for  details of his medicines.   On physical examination he is a pleasant ambulatory, obese, white male,  at 240 pounds, no change of baseline.  HEENT: Unremarkable. Pharynx clear.  LUNG FIELDS: Completely clear bilaterally to auscultation and  percussion.  There is regular rhythm without murmur, gallop, rub.  ABDOMEN: Soft, benign.  EXTREMITIES: Warm without calf tenderness, cyanosis, clubbing, or edema.   IMPRESSION:  1. Chest pain is completely resolved on Reglan consistent with the      effects of reflux.  2. Morbid obesity is his main risk factor for reflux. Until he is able      to lose more weight I believe he should stay on the same regimen      and emphasized this to him again in the context of a review of his      medication calendar.   Follow up will be in 3 months, sooner if needed.     [name]. [name], MD, [facility]  Electronically Signed    MBW/MedQ  DD: 04/19/2006  DT: 04/19/2006  Job #: [id number]

## 2010-06-13 NOTE — Discharge Summary (Signed)
NAMEMICHAELPAUL, [name]              ACCOUNT NO.:  [id number]   MEDICAL RECORD NO.:  [id number]          PATIENT TYPE:  INP   LOCATION:  1326                         FACILITY:  [facility]   PHYSICIAN:  Blenda Nicely. Shadad        DATE OF BIRTH:  03-13-1932   DATE OF ADMISSION:  10/11/2005  DATE OF DISCHARGE:  10/15/2005                                 DISCHARGE SUMMARY   ADMISSION DIAGNOSES:  1. Fever.  2. Abscess.  3. Cellulitis of the leg.   DISCHARGE DIAGNOSES:  1. Fever.  2. Abscess.  3. Cellulitis of the leg.  4. Status post 4 days of intravenous antibiotic as well as irrigation and      debridement of a right groin abscess.   HISTORY OF PRESENT ILLNESS:  A pleasant, 77 year old gentleman diagnosed  with follicular lymphoma in April 2007.  He had a grade 3/3 with stage 3  under the diaphragm lymph nodes including bilateral inguinal lymph nodes  that were biopsied back in April 2007.  The patient received a total of 5  cycles of chemotherapy utilizing CHOP with rituximab and subsequently  developed a fever prior to his sixth cycle of chemotherapy.  Initially, it  was delayed and subsequently the patient presented to the emergency  department complaining of right leg, groin pain, swelling, and redness  without really improvement on p.o. antibiotic and subsequently was admitted  on September 16.  The patient was started empirically on antibiotics with  imipenem and Vancomycin.  Cultures were obtained.  General Surgery was  obtained.  Dr. Derrell Lolling evaluated the patient and felt that he had a possible  abscess in his right groin.  A CT scan was obtained on September 16, which  showed an increase in the size of the right groin fluid collection, 2 small  inguinal collections noted.  And, given the size of these inguinal  collections, the patient underwent an incision and drainage of the right  inguinal and right femoral abscess.  The patient tolerated the procedure  well.  The wound was  packed appropriately and, again, the patient was  started on broad-spectrum antibiotics.  Over the next few days of his  hospital course, which showed that he remains to be afebrile, cultures from  the blood as well as the abscess did not reveal any organism.  His  cellulitis subsided effectively immediately.  The wound, again, was  continued to be packed regularly and upon discharge, home health care was  consulted for home wound care to be changed twice a day and a supply was  given to the patient with saline and 4 x 4's as well.  Clinically, Mr.  [name], again, felt well all throughout his hospitalization, did not have  any fevers, did not have any changes in his vital signs or toxemia or  anything to indicate septicemia at this point.   On the day of discharge, he was awake, alert, not in any distress.  Blood pressure is 125/76.  Pulse 84.  Respirations 18.  Temperature is 98.4.  He was satting 95% on room air.  Head  was normocephalic, atraumatic.  Pupils equal and round and reactive to  light.  Mucous membranes are moist and pink.  Neck supple.  No lymphadenopathy.  Heart is regular rate and rhythm, S1 and S2.  Lungs are clear to auscultation.  No rhonchi, wheezes on auscultation and  percussion.  Abdomen is soft and nontender without hepatosplenomegaly.  EXTREMITIES:  No clubbing, cyanosis, or edema.  His right groin was packed.  His erythema has subsided dramatically.   On the day of discharge, his hemoglobin was 10.7, white cell 4.4, platelet  count of 298.  His chemistries were normal throughout his hospitalization.  He did require 2 units of packed red cells as his hemoglobin went to 8.5 and  he felt rather fatigued.   DISCHARGE MEDICATION:  He will be discharged on  1. Doxycycline at 100 mg b.i.d. for 5 days.  2. Protonix 40 mg daily.  3. Niacin 500 mg daily.  4. Trazodone 150 mg nightly.  5. Lexapro 10 mg daily.  6. Aspirin 81 mg daily.   FOLLOW UP:  The patient will  followup with Dr. Derrell Lolling in 2 weeks and will  followup at the [facility] for his next chemotherapy in the next  1 to 2 weeks.   DISCHARGE CONDITION:  Good.   Followup as mentioned.  Home care agency was contacted for dressing changes.           ______________________________  Blenda Nicely [facility]  Electronically Signed     FNS/MEDQ  D:  10/15/2005  T:  10/16/2005  Job:  [id number]

## 2010-06-13 NOTE — Op Note (Signed)
NAMEALIZE, ACY              ACCOUNT NO.:  [id number]   MEDICAL RECORD NO.:  [id number]          PATIENT TYPE:  OIB   LOCATION:  1324                         FACILITY:  [facility]   PHYSICIAN:  Sigmund I. Patsi Sears, M.D.DATE OF BIRTH:  04-29-1932   DATE OF PROCEDURE:  10/22/2005  DATE OF DISCHARGE:  10/23/2005                                 OPERATIVE REPORT   PREOPERATIVE DIAGNOSIS:  Eroded left semi-rigid penile prosthesis.   POSTOPERATIVE DIAGNOSIS:  1. Eroded left semi-rigid penile prosthesis.  2. Explanation of semi-rigid penile prosthesis.  3. Left corporal irrigation and drainage.  4. Foley catheterization.   SURGEON:  Sigmund I. Patsi Sears, M.D.   ANESTHESIA:  General endotracheal anesthesia.   PREPARATION:  After appropriate preanesthesia, the patient was brought to  the operating room and placed on the operating table in the dorsal supine  position where general endotracheal anesthesia was introduced.  He remained  in the supine position where the penis and scrotum were prepped with  Betadine solution and draped in the usual fashion.   REVIEW OF HISTORY:  Review of history showed that this 77 year old male is  status post semi-rigid penile prosthesis per Dr. Elige Radon in 1988, now with  lymphoma, status post five cycles of chemotherapy.  He is most recently  status post right groin abscess.  Incision and drainage with negative  cultures.  The patient noticed swelling in his penis several days ago with  pain at the end of his penis and swelling of the foreskin.  He was felt to  have possible phimosis but further inspection revealed that the patient had  eroded his left corporeal penile prosthesis cylinder through the urethra in  the distal left side.  He is now for prosthesis removal and drainage.   DESCRIPTION OF PROCEDURE:  A 5 cm midline penoscrotal incision is made and  the subcutaneous tissue dissected with the electrosurgical unit.  Prior to  this, a Foley  catheter was passed in the bladder without difficulty.  The  corpora cavernosa were identified bilaterally and incised.  Two stay sutures  were placed in the corpora bilaterally.  The prostheses were removed and  inspection revealed no evidence of rear-tip extender.  Corporeal irrigation  was accomplished with antibiotic irrigation and a left corporeal drain was  left in place with a quarter-inch Penrose drain.  The corpora cavernosa was  closed distally and proximally to the drain.  The right corpora cavernosa  was closed primarily with  2-0 Vicryl suture.  The wound was then closed in  two layers with 3-0 Vicryl suture.  The  drain was brought out through a separate stab wound in the left hemiscrotum  and sutured in place with 3-0 Vicryl suture.  The wound was anesthetized  with Marcaine 0.25%.  The patient was awakened and taken to the recovery  room in good condition.      Sigmund I. Patsi Sears, M.D.  Electronically Signed     SIT/MEDQ  D:  10/22/2005  T:  10/23/2005  Job:  [id number]

## 2010-06-13 NOTE — Assessment & Plan Note (Signed)
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  He  denies any ongoing fevers, chills, sweats.  He complains of dyspnea with  anything more than slow ADLs.  Also denies any orthopnea or PND.  Has  minimal dry cough.   PAST MEDICAL HISTORY:  1. Morbid obesity, target weight less than 186.  2. Chronic rhinitis with most recent sinus CT scan negative in 1999.  3. Chronic fatigue, previously responsive to trazodone.  4. Colon polyps, status post most recent colonoscopy in March 03, 2005      showing diverticulosis and hemorrhoids.  5. Benign prostatic hypertrophy, previously followed by Dr. Elige Radon, and      no longer getting regular followup.  6. Status post removal of penile prosthesis by Dr. Patsi Sears within the      past year.  7. Pericarditis, status post pericardectomy in June, 2000.   ALLERGIES:  None known.   MEDICATIONS:  Taken in detail on the work sheet, correct as in the column  dated November 12, 2005, which correlates nicely with his medication  calendar.   SOCIAL HISTORY:  He is a remote smoker.  Denies any significant excess  alcohol use.   REVIEW OF SYSTEMS:  Taken in detail on the work sheet, essentially negative,  except as outlined above.   PHYSICAL EXAMINATION:  GENERAL:  This is an obese, ambulatory white male in  no acute distress.  VITAL  SIGNS:  His weight is 246 pounds versus 253 a year ago.  Blood  pressure 114/70 after taking his blood pressure pills this morning.  HEENT:  Limited funduscopy revealed no __________ retinal or arterial  change.  Oropharynx is clear.  Dentition was adequate.  Ears are clear  bilaterally.  NECK:  Supple without cervical adenopathy or tinnitus.  Carotid upstrokes  are brisk without any bruits.  LUNGS:  Completely clear bilaterally to auscultation and percussion.  HEART:  Regular rhythm without murmur, rub or gallop present.  ABDOMEN:  Obese but otherwise benign with normal excursion in the supine  position.  I could not appreciate any bruits or hepatomegaly.  GENITOURINARY:  Testes are descended bilaterally with no nodules.  He was  uncircumcised.  RECTAL:  Mild BPH.  Smooth texture.  Stool guaiacs are negative.  No  nodules.  EXTREMITIES:  Warm without calf tenderness, clubbing, cyanosis or edema.  He  had mild venous stasis dermatitis bilaterally but strong pedal pulses and 1+  pitting edema.  NEUROLOGIC:  No focal deficits.  Pathologic reflexes.  SKIN:  Warm and dry.  He had a healing wound in the right groin with a wound  pack and no excess drainage.   LAB DATA:  CBC revealed a hematocrit of 37.9% with 6% eosinophils.  Cholesterol was 166 with an HDL of 29 and an LDL of 91.  BNP was 187.  TSH  was normal.  Urinalysis was unremarkable.  PSA is pending.  Chemistry  profile was normal except for a fasting blood sugar of 133.   IMPRESSION:  1. Morbid obesity complicated by borderline diabetes.  It is going to be      very difficult for this patient to lose weight while under      chemotherapy, but I do believe he would benefit from nutrition      evaluation and discussed this issue with him a year ago with no      significant progression towards goal over the last year.  2. Chronic peripheral edema with no evidence of heart failure by BNP and      normal TSH.  He appears to be doing  well with furosemide and potassium      but have a low threshold to add spironolactone if needed.  3. Hyperlipidemia:  Adequate control without the need for intervention.  4. Health maintenance:  He was updated in tetanus in 1997; therefore,      received another tetanus today.  Pneumovax in 2000, but because he has      lymphoma, I gave him another Pneumovax today and also recommended a flu      shot yearly.  5. Chronic fatigue with tendency to depression and insomnia:  Improved on      his present combination of Lexapro q.a.m. and trazodone nightly.  6. Diverticulosis:  I have recommended continuing Citrucel daily.  7. Chronic rhinitis:  Adequately controlled on p.r.n.'s.  8. Follicular cell lymphoma:  Followed by Dr. Clelia Croft.   Followup will be in three months, at which time a hemoglobin A1C needs to be  checked, and formal nutrition evaluation will be recommended.            ______________________________  [name]. [name], MD, [facility]      MBW/MedQ  DD:  11/12/2005  DT:  11/15/2005  Job #:  [id number]

## 2010-06-13 NOTE — Op Note (Signed)
[name], ZAHLER NO.:  [id number]   MEDICAL RECORD NO.:  [id number]          PATIENT TYPE:  AMB   LOCATION:  DSC                          FACILITY:  [facility]   PHYSICIAN:  Gita Kudo, M.D. DATE OF BIRTH:  [birth year]   DATE OF PROCEDURE:  05/25/2005  DATE OF DISCHARGE:                                 OPERATIVE REPORT   OPERATIVE PROCEDURE:  Bilateral groin lymph node biopsy -- 1 right femoral,  2 left inguinal.   SURGEON:  Gita Kudo, M.D.   ANESTHESIA:  General plus 30 mL of 0.5% Marcaine with epinephrine for postop  analgesia.   PREOPERATIVE DIAGNOSIS:  Bilateral inguinal lymphadenopathy, possible  lymphoma.   POSTOPERATIVE DIAGNOSIS:  Bilateral inguinal lymphadenopathy, possible  lymphoma, pending final pathology.   CLINICAL SUMMARY:  [age]-year-old male with recent enlargement of painless  nodes in each groin.   OPERATIVE FINDINGS:  He had a very large node that looked as though there  was some necrosis in it in his right femoral region.  It was near the  saphenous vein, which was spared injury.  On the left side, there were  several smaller, but deeper and abnormal nodes in the inguinal canal.   OPERATIVE PROCEDURE:  Under satisfactory general anesthesia, the patient was  positioned, prepped and draped in a standard fashion.  He received Ancef 1 g  preop.  The right side was approached first.  An oblique incision was made  in the upper right groin over the femoral node and carried down to it.  Careful dissection was performed using multiple clips and ties to free the  lymph node, which was then sent for touch prep.  The wound meanwhile was  infiltrated with Xylocaine, checked for hemostasis and closed in layers with  Vicryl and then staples for skin.  The touch prep came back as a necrotic  node, possible tumor, but not absolutely sure.   Therefore, we went over to the left side as planned.  An inguinal incision  was made and carried  down to the 2 lymph nodes, which were likewise  dissected, freed and secured at the base with 2-0 Vicryl suture or metal  clips.  These were both sent as touch prep.  The wound was infiltrated with  Marcaine and then closed in layers as on the other side.   Pathology reported that the second touch prep did show lots of lymph tissue  and uncertain as to the diagnosis.  No further surgery was felt indicated.  A sterile absorbent dressing was applied and the patient went to the  recovery room in good condition.           ______________________________  Gita Kudo, M.D.     MRL/MEDQ  D:  05/25/2005  T:  05/26/2005  Job:  [id number]   cc:   [name]. [name], M.D. [facility]  [address]  [place]  [place] [address]

## 2010-06-16 ENCOUNTER — Encounter: Payer: Self-pay | Admitting: *Deleted

## 2010-06-17 ENCOUNTER — Encounter: Payer: Medicare Other | Admitting: Internal Medicine

## 2010-06-20 ENCOUNTER — Ambulatory Visit (INDEPENDENT_AMBULATORY_CARE_PROVIDER_SITE_OTHER): Payer: Medicare Other | Admitting: Adult Health

## 2010-06-20 ENCOUNTER — Encounter: Payer: Self-pay | Admitting: Adult Health

## 2010-06-20 ENCOUNTER — Other Ambulatory Visit (INDEPENDENT_AMBULATORY_CARE_PROVIDER_SITE_OTHER): Payer: Medicare Other

## 2010-06-20 VITALS — BP 108/60 | HR 93 | Temp 98.5°F | Ht 71.0 in | Wt 256.0 lb

## 2010-06-20 DIAGNOSIS — J309 Allergic rhinitis, unspecified: Secondary | ICD-10-CM

## 2010-06-20 DIAGNOSIS — E669 Obesity, unspecified: Secondary | ICD-10-CM

## 2010-06-20 DIAGNOSIS — E1365 Other specified diabetes mellitus with hyperglycemia: Secondary | ICD-10-CM

## 2010-06-20 DIAGNOSIS — J209 Acute bronchitis, unspecified: Secondary | ICD-10-CM

## 2010-06-20 DIAGNOSIS — R059 Cough, unspecified: Secondary | ICD-10-CM

## 2010-06-20 DIAGNOSIS — R05 Cough: Secondary | ICD-10-CM

## 2010-06-20 DIAGNOSIS — IMO0002 Reserved for concepts with insufficient information to code with codable children: Secondary | ICD-10-CM

## 2010-06-20 NOTE — Patient Instructions (Addendum)
We are setting you up for a CT sinus  We are referring you to a nutritionist to help with diabetes and weight loss.  Follow up Dr. [name]  2 months for physical.  Follow med calendar closely and bring to each visit.

## 2010-06-20 NOTE — Progress Notes (Signed)
Subjective:    Patient ID: [name], male    DOB: 1932-05-29, 77 y.o.   MRN: [id number]  HPI   9 yowm quit smoking 1970 with morbid obesity with boderline DM, Lymphoma s/p chemo ([facility]) and tendency to peripheral edema that is felt to be dependent.   April 24, 2009--Returns for post hospital follow up. Admitted 04/11/2009- 04/15/2009 for CAP and leukopenia. CX were neg. CXR showed LLL infiltrate. He was tx w/ IV abx and aggressive pulmonary hygiene. He is doing much better w/ decreased dyspnea/cough. Has finished antibiotics. Is using flutter valve few times a day. Denies chest pain, orthopnea, hemoptysis, fever, n/v/d, edema, headache. His wbc did trend down during admission wbc 4.3>>1.9. Dr Clelia Croft following.   May 08, 2009 2 wk followup. Pt states that chest congestion is improving as well as his breathing. He states that he still feels very fatigued, still needing proaire up to 4 x daily whereas as previously only 4 x weekly. no purulent sputum. rx prednsione x 6 days> resolved   May 21, 2009 Acute visit c/o rash on left arm and left side onset April 22 Pt states rash is very itchy and sometimes painful. rec lotrisone   August 06, 2009 cc for the past month he is unable to walk at a brisk pace without getting out of breath. He still has dry cough "nothing new". very inactive no longer doing treadmill. no problem at night or sitting still. nasal congestion but not using nasonex as per calendar.   August 21, 2009--ov/ NP med review. pt brought all meds with him today. no new complaints. He is doing well. We reviewed his meds and organized them into med calendar. CXR last visit w/ no acute findings. Labs showed persistent pancytopenia. Has follow up w Dr. Frederich Chick > not due until Jan 2012   October 29, 2009 Followup. Pt c/o cough x 5 days- prod with minimal green sputum. Also he c/o sneezing. He has noticed some wheezing with exertion and also has had slight increase in SOB since  cough started. now needing saba whereas prev did not need it. Rx Doxycycline, steroid taper.    March 26, 2010 ov cc doe occ uses proaire, avg once every other day , no noct need, no purulent sputum.    >>no changes   04/30/10  Acute OV--Complains of chest congestion, dry cough, increased SOB, wheezing, x4days and fever 2 days ago. went to [facility] and ER on 04-28-10, was given neb tx only. CXR w/ no acute process, cbc unchanged. No UTI on urine. Pt says his cough started 4 days ago with barking cough to point of vomitting. Very hard to get up mucus. Started on Mucinex DM without much help. Has had fever-tmax 101.4. Daughter is with him today. He is eating and drinking well. No n/v/d, abdominal pain, urinary symptoms. Feels very worn out. Low energy.  Seen in ER but was discharged home without rx. He is no better today with persistent cough. No increased leg swelling. >>Dx with Bronchitis-tx w/ Avelox and Depo Medrol .   05/07/2010 ov f/u seen in ER on 04/28/10  with CXR neg for acute process. He was treated at our office on 4/4 with Avelox and Depo medrol . Did have persistent symptoms and called in on 4/7 -was called in steroid taper. He is slowly turning corner. He feels better but still weak, low energy. His cough/congestion are better.   06/03/10 Acute ov-tx for AECOPD>  Levaquin 500mg  daily for 7  days  Mucinex DM Twice daily  As needed  Cough/congestion  Prednisone taper over next week.  Hold sulindac while on prednisone.  Fluids and rest Tessalon 200mg  Three times a day  As needed   follow up Dr. [name]  In 2 weeks and As needed   06/06/2010 ov/ Wert cough has never really resolved since Jan 2012 some better on prednsione, ever since the winter, worse in am p wakes up > white mucus.  Assoc some nasal congestion and ok cxr on 4/2 x for HH.  On pred taper since 5/8 and levaquin.   Has lost calendar and seems confused when shown our copy, even with help of daughter.  No purulent sputum. No resting symtoms.    Leg swelling better on double dose furosemide.  >>finish abx and steroids  06/20/10 Follow up and med review.  Last visit with slow to resolve AECOPD , tx w/ levaquin and prednisone.He has now finshed. He is feeling better but still has some green mucus from time to time. Is getting his energy back. Is planning to go to [facility] next week -very excited. Has nasal congesiton and drainage.   Leg swelling is better. ONly having to use lasix daily   We reviewed all his meds and organized them into a med calendar with pt education.   We discussed his weight and diet . He is in agreement to be referred to nutritionist. Blood sugars have been trending up.  Pt with new onset DM, pt informed of decreasing sugars .   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 04/2005.......[facility]  - last chemo 10/08  -repeat CT/ABD CT 11/18/07--no reccurence  - Pancytopenia August 06, 2009 > refer back to [facility] > aranesp rx Jan 2012  RHINITIS, ALLERGIC NOS (ICD-477.9)  >>CT sinus 06/20/10 > HYPERLIPIDEMIA NEC/NOS (ICD-272.4)  EXOGENOUS OBESITY (ICD-278.00)  - ideal body weight less than 186  AODM onset 2012 with freq pred for airways issues Vitamin D Deficiency- level 29>>44 (4/9//10)  Left Hip pain onset around 6/09............................................................................Marland KitchenHiltz  - MRI 11/23/07 c/w L1-2 bulging disc indents the thecal sac with foraminal stenosis  HEALTH MAINTENANCE..........................................................................................Marland KitchenWert  - Td 10/2005  - Pneumovax 10/07 second shot  - CPX 11/08/07  --colon 02/2005 -int/extern. hems (repeat q7y)  Complex med regimen  --Meds reviewed with pt education and computerized med calendar completed/adjusted. November 08, 2008 , August 21, 2009   Syncope...........................................................................................................Marland KitchenHochrein  - Mobitz II AV block s/p Medtronic pacemaker placement 12/11/2008  Dermatology.....................................................................................................[facility]  - Pruritic rash 04/2009 > tried lotrisone, referred to [facility] August 06, 2009    Review of Systems      Objective:   Physical Exam   Ambulatory obese wm in no acute distress  HEENT: nl dentition, turbinates, and orophanx. Nl external ear canals without cough reflex  Neck without JVD/Nodes/TM  Nasal mucosa is swollen/boggy Lungs icoarse BS w/ no wheezing  RRR no s3 or murmur or increase in P2, no sign edema  Abd soft and benign with nl excursion in the supine position.  Ext warm without calf tenderness, cyanosis clubbing mild/mod chronic venous insufficiency changes        Assessment & Plan:

## 2010-06-20 NOTE — Assessment & Plan Note (Signed)
Check A1C today  Consider meds if >7.5

## 2010-06-20 NOTE — Assessment & Plan Note (Signed)
Pt had a very slow to resolve bronchitic flare w/ lingering discolored mucus Will check CT sinus to r/o sinus dz.

## 2010-06-26 ENCOUNTER — Other Ambulatory Visit: Payer: Self-pay | Admitting: *Deleted

## 2010-07-01 ENCOUNTER — Ambulatory Visit (INDEPENDENT_AMBULATORY_CARE_PROVIDER_SITE_OTHER)
Admission: RE | Admit: 2010-07-01 | Discharge: 2010-07-01 | Disposition: A | Discharge status: Home / Self Care | Payer: Medicare Other | Source: Ambulatory Visit | Attending: Adult Health | Admitting: Adult Health

## 2010-07-01 ENCOUNTER — Other Ambulatory Visit: Payer: Self-pay | Admitting: Adult Health

## 2010-07-01 DIAGNOSIS — R05 Cough: Secondary | ICD-10-CM

## 2010-07-02 ENCOUNTER — Other Ambulatory Visit: Payer: Self-pay | Admitting: *Deleted

## 2010-07-02 MED ORDER — METFORMIN HCL 500 MG PO TABS: 500.0000 mg | ORAL_TABLET | Freq: Two times a day (BID) | ORAL | Status: DC

## 2010-07-02 MED ORDER — TRAZODONE HCL 150 MG PO TABS: ORAL_TABLET | ORAL | Status: DC

## 2010-07-03 ENCOUNTER — Other Ambulatory Visit: Payer: Self-pay | Admitting: Oncology

## 2010-07-03 ENCOUNTER — Encounter (HOSPITAL_BASED_OUTPATIENT_CLINIC_OR_DEPARTMENT_OTHER): Payer: Medicare Other | Admitting: Oncology

## 2010-07-03 DIAGNOSIS — D649 Anemia, unspecified: Secondary | ICD-10-CM

## 2010-07-03 DIAGNOSIS — C8588 Other specified types of non-Hodgkin lymphoma, lymph nodes of multiple sites: Secondary | ICD-10-CM

## 2010-07-03 LAB — CBC WITH DIFFERENTIAL/PLATELET
EOS%: 2.1 % (ref 0.0–7.0)
LYMPH%: 58.6 % — ABNORMAL HIGH (ref 14.0–49.0)
MCH: 37.7 pg — ABNORMAL HIGH (ref 27.2–33.4)
MCV: 107.7 fL — ABNORMAL HIGH (ref 79.3–98.0)
MONO%: 3.3 % (ref 0.0–14.0)
RBC: 2.45 10*6/uL — ABNORMAL LOW (ref 4.20–5.82)
RDW: 16.8 % — ABNORMAL HIGH (ref 11.0–14.6)

## 2010-07-24 ENCOUNTER — Ambulatory Visit: Payer: Medicare Other | Admitting: *Deleted

## 2010-07-29 ENCOUNTER — Encounter (HOSPITAL_BASED_OUTPATIENT_CLINIC_OR_DEPARTMENT_OTHER): Payer: Medicare Other | Admitting: Oncology

## 2010-07-29 ENCOUNTER — Other Ambulatory Visit: Payer: Self-pay | Admitting: Medical

## 2010-07-29 DIAGNOSIS — D638 Anemia in other chronic diseases classified elsewhere: Secondary | ICD-10-CM

## 2010-07-29 DIAGNOSIS — C8299 Follicular lymphoma, unspecified, extranodal and solid organ sites: Secondary | ICD-10-CM

## 2010-07-29 DIAGNOSIS — D709 Neutropenia, unspecified: Secondary | ICD-10-CM

## 2010-07-29 DIAGNOSIS — D61818 Other pancytopenia: Secondary | ICD-10-CM

## 2010-07-29 LAB — CBC WITH DIFFERENTIAL/PLATELET
BASO%: 0.5 % (ref 0.0–2.0)
Eosinophils Absolute: 0 10*3/uL (ref 0.0–0.5)
MCHC: 34.8 g/dL (ref 32.0–36.0)
MCV: 110.3 fL — ABNORMAL HIGH (ref 79.3–98.0)
MONO#: 0 10*3/uL — ABNORMAL LOW (ref 0.1–0.9)
MONO%: 2.7 % (ref 0.0–14.0)
NEUT#: 0.6 10*3/uL — ABNORMAL LOW (ref 1.5–6.5)
RBC: 2.28 10*6/uL — ABNORMAL LOW (ref 4.20–5.82)
RDW: 17.9 % — ABNORMAL HIGH (ref 11.0–14.6)
WBC: 1.7 10*3/uL — ABNORMAL LOW (ref 4.0–10.3)

## 2010-07-29 LAB — COMPREHENSIVE METABOLIC PANEL
ALT: 14 U/L (ref 0–53)
Albumin: 4.4 g/dL (ref 3.5–5.2)
Alkaline Phosphatase: 29 U/L — ABNORMAL LOW (ref 39–117)
Glucose, Bld: 124 mg/dL — ABNORMAL HIGH (ref 70–99)
Potassium: 4.2 mEq/L (ref 3.5–5.3)
Sodium: 142 mEq/L (ref 135–145)
Total Bilirubin: 0.7 mg/dL (ref 0.3–1.2)
Total Protein: 6.5 g/dL (ref 6.0–8.3)

## 2010-08-14 ENCOUNTER — Ambulatory Visit: Payer: Medicare Other | Admitting: *Deleted

## 2010-08-20 ENCOUNTER — Ambulatory Visit: Payer: Medicare Other | Admitting: *Deleted

## 2010-08-20 ENCOUNTER — Ambulatory Visit (INDEPENDENT_AMBULATORY_CARE_PROVIDER_SITE_OTHER)
Admission: RE | Admit: 2010-08-20 | Discharge: 2010-08-20 | Disposition: A | Discharge status: Home / Self Care | Payer: Medicare Other | Source: Ambulatory Visit | Attending: Internal Medicine | Admitting: Internal Medicine

## 2010-08-20 ENCOUNTER — Other Ambulatory Visit (INDEPENDENT_AMBULATORY_CARE_PROVIDER_SITE_OTHER): Payer: Medicare Other

## 2010-08-20 ENCOUNTER — Encounter: Payer: Self-pay | Admitting: Internal Medicine

## 2010-08-20 ENCOUNTER — Ambulatory Visit (INDEPENDENT_AMBULATORY_CARE_PROVIDER_SITE_OTHER): Payer: Medicare Other | Admitting: Internal Medicine

## 2010-08-20 VITALS — BP 120/60 | HR 81 | Temp 97.0°F | Ht 71.0 in | Wt 259.0 lb

## 2010-08-20 DIAGNOSIS — R05 Cough: Secondary | ICD-10-CM

## 2010-08-20 DIAGNOSIS — E1365 Other specified diabetes mellitus with hyperglycemia: Secondary | ICD-10-CM

## 2010-08-20 DIAGNOSIS — N139 Obstructive and reflux uropathy, unspecified: Secondary | ICD-10-CM

## 2010-08-20 DIAGNOSIS — E669 Obesity, unspecified: Secondary | ICD-10-CM

## 2010-08-20 DIAGNOSIS — J309 Allergic rhinitis, unspecified: Secondary | ICD-10-CM

## 2010-08-20 DIAGNOSIS — E785 Hyperlipidemia, unspecified: Secondary | ICD-10-CM

## 2010-08-20 LAB — URINALYSIS
Bilirubin Urine: NEGATIVE
Hgb urine dipstick: NEGATIVE
Ketones, ur: NEGATIVE
Total Protein, Urine: NEGATIVE
Urine Glucose: NEGATIVE
pH: 6 (ref 5.0–8.0)

## 2010-08-20 NOTE — Assessment & Plan Note (Signed)
Metformin started 6 weeks ago > recheck fbs/ hgba1c at 3months (in 6 more weeks)

## 2010-08-20 NOTE — Assessment & Plan Note (Signed)
Resolved on present rx, likely exac by gerd

## 2010-08-20 NOTE — Patient Instructions (Signed)
See calendar for specific medication instructions and bring it back for each and every office visit for every healthcare provider you see.  Without it,  you may not receive the best quality medical care that we feel you deserve.  You will note that the calendar groups together  your maintenance  medications that are timed at particular times of the day.  Think of this as your checklist for what your doctor has instructed you to do until your next evaluation to see what benefit  there is  to staying on a consistent group of medications intended to keep you well.  The other group at the bottom is entirely up to you to use as you see fit  for specific symptoms that may arise between visits that require you to treat them on an as needed basis.  Think of this as your action plan or "what if" list.   Separating the top medications from the bottom group is fundamental to providing you adequate care going forward.    Please schedule a follow up office visit in 6 weeks, call sooner if needed FASTING on REturn

## 2010-08-20 NOTE — Progress Notes (Signed)
Subjective:     Patient ID: [name], male   DOB: [birth year], 77 y.o.   MRN: [id number]  HPI   67 yowm quit smoking 1970 with morbid obesity with boderline DM, Lymphoma s/p chemo ([facility]) and tendency to peripheral edema that is felt to be dependent.   April 24, 2009--Returns for post hospital follow up. Admitted 04/11/2009- 04/15/2009 for CAP and leukopenia. CX were neg. CXR showed LLL infiltrate. He was tx w/ IV abx and aggressive pulmonary hygiene. He is doing much better w/ decreased dyspnea/cough. Has finished antibiotics. Is using flutter valve few times a day. Denies chest pain, orthopnea, hemoptysis, fever, n/v/d, edema, headache. His wbc did trend down during admission wbc 4.3>>1.9. Dr Clelia Croft following.   May 08, 2009 2 wk followup. Pt states that chest congestion is improving as well as his breathing. He states that he still feels very fatigued, still needing proaire up to 4 x daily whereas as previously only 4 x weekly. no purulent sputum. rx prednsione x 6 days> resolved   May 21, 2009 Acute visit c/o rash on left arm and left side onset April 22 Pt states rash is very itchy and sometimes painful. rec lotrisone   August 06, 2009 cc for the past month he is unable to walk at a brisk pace without getting out of breath. He still has dry cough "nothing new". very inactive no longer doing treadmill. no problem at night or sitting still. nasal congestion but not using nasonex as per calendar.   August 21, 2009--ov/ NP med review. pt brought all meds with him today. no new complaints. He is doing well. We reviewed his meds and organized them into med calendar. CXR last visit w/ no acute findings. Labs showed persistent pancytopenia. Has follow up w Dr. Frederich Chick > not due until Jan 2012   October 29, 2009 Followup. Pt c/o cough x 5 days- prod with minimal green sputum. Also he c/o sneezing. He has noticed some wheezing with exertion and also has had slight increase in SOB since  cough started. now needing saba whereas prev did not need it. Rx Doxycycline, steroid taper.    March 26, 2010 ov cc doe occ uses proaire, avg once every other day , no noct need, no purulent sputum.    >>no changes   04/30/10  Acute OV--Complains of chest congestion, dry cough, increased SOB, wheezing, x4days and fever 2 days ago. went to [facility] and ER on 04-28-10, was given neb tx only. CXR w/ no acute process, cbc unchanged. No UTI on urine. Pt says his cough started 4 days ago with barking cough to point of vomitting. Very hard to get up mucus. Started on Mucinex DM without much help. Has had fever-tmax 101.4. Daughter is with him today. He is eating and drinking well. No n/v/d, abdominal pain, urinary symptoms. Feels very worn out. Low energy.  Seen in ER but was discharged home without rx. He is no better today with persistent cough. No increased leg swelling. >>Dx with Bronchitis-tx w/ Avelox and Depo Medrol .   05/07/2010 ov f/u seen in ER on 04/28/10  with CXR neg for acute process. He was treated at our office on 4/4 with Avelox and Depo medrol . Did have persistent symptoms and called in on 4/7 -was called in steroid taper. He is slowly turning corner. He feels better but still weak, low energy. His cough/congestion are better.  Levaquin 500mg  daily for 7 days  Mucinex DM Twice daily  As needed  Cough/congestion  Prednisone taper over next week.  Hold sulindac while on prednisone.  Fluids and rest Tessalon 200mg  Three times a day  As needed   follow up Dr. [name]  In 2 weeks and As needed   06/06/2010 ov/ [name] cough has never really resolved since Jan 2012 some better on prednsione, ever since the winter, worse in am p wakes up > white mucus.  Assoc some nasal congestion and ok cxr on 4/2 x for HH.  On pred taper since 5/8 and levaquin.  Has lost calendar and seems confused when shown our copy, even with help of daughter.  No purulent sputum. No resting symtoms.   Leg swelling better on double dose  furosemide.  rec Nutrition consult for wt loss, dm control Sinus CT PFT's  On return  06/20/10 ov/NP med calendar  We are setting you up for a CT sinus > 1. No significant paranasal sinus disease.  We are referring you to a nutritionist to help with diabetes and weight loss Start metformin 500 mg bid    08/20/2010 cpx/ [name] cc  No new or acute complaints but not fasting.  Pt denies any significant sore throat, dysphagia, itching, sneezing,  nasal congestion or excess/ purulent secretions,  fever, chills, sweats, unintended wt loss, pleuritic or exertional cp, hempoptysis, orthopnea pnd or leg swelling.    Also denies any obvious fluctuation of symptoms with weather or environmental changes or other aggravating or alleviating factors.    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 04/2005.......[facility]  - last chemo 10/08  -repeat CT/ABD CT 11/18/07--no reccurence  - Pancytopenia August 06, 2009 > refer back to [facility] > aranesp rx Jan 2012  RHINITIS, ALLERGIC NOS (ICD-477.9)  HYPERLIPIDEMIA NEC/NOS (ICD-272.4)  EXOGENOUS OBESITY (ICD-278.00)  - ideal body weight less than 186  AODM onset 2012 with freq pred for airways issues Vitamin D Deficiency- level 29>>44 (4/9//10)  Left Hip pain onset around 6/09............................................................................Marland KitchenHiltz  - MRI 11/23/07 c/w L1-2 bulging disc indents the thecal sac with foraminal stenosis  HEALTH MAINTENANCE..........................................................................................Marland KitchenWert  - Td 10/2005  - Pneumovax 10/07 second shot  - CPX 08/20/2010  --colon 02/2005 -int/extern. hems (repeat q7y)  Complex med regimen  --Meds reviewed with pt education and computerized med calendar completed/adjusted. November 08, 2008 , August 21, 2009    Syncope...........................................................................................................Marland KitchenHochrein  - Mobitz II AV block s/p Medtronic pacemaker placement 12/11/2008  Dermatology.....................................................................................................[facility]  - Pruritic rash 04/2009 > tried lotrisone, referred to [facility] August 06, 2009    Family History:   mother had diabetes   Social History:  Patient states former smoker.  quit smoking in 1970  No ETOH  Retired  Review of Systems  Constitutional: Positive for activity change. Negative for fever, chills, diaphoresis, appetite change, fatigue and unexpected weight change.  HENT: Positive for congestion, sneezing and neck pain. Negative for hearing loss, ear pain, nosebleeds, sore throat, facial swelling, rhinorrhea, mouth sores, trouble swallowing, neck stiffness, dental problem, voice change, postnasal drip, sinus pressure, tinnitus and ear discharge.   Eyes: Negative for photophobia, discharge, itching and visual disturbance.  Respiratory: Positive for shortness of breath. Negative for apnea, cough, choking, chest tightness, wheezing and stridor.   Cardiovascular: Positive for leg swelling. Negative for chest pain and palpitations.  Gastrointestinal: Negative for nausea, vomiting, abdominal pain, constipation, blood in stool and abdominal distention.  Genitourinary: Positive  for frequency. Negative for dysuria, urgency, hematuria, flank pain, decreased urine volume and difficulty urinating.  Musculoskeletal: Positive for back pain. Negative for myalgias, joint swelling, arthralgias and gait problem.  Skin: Negative for color change, pallor and rash.  Neurological: Positive for light-headedness and headaches. Negative for dizziness, tremors, seizures, syncope, speech difficulty, weakness and numbness.  Hematological: Negative for adenopathy. Bruises/bleeds easily.   Psychiatric/Behavioral: Negative for confusion, sleep disturbance and agitation. The patient is not nervous/anxious.        Objective:   Physical Exam wt 244 January 16, 2008 > 249 April 24, 2009 >   > 259 March 26, 2010 > 256 06/06/10 > 259 08/20/2010  Ambulatory obese wm in no acute distress somewhat slowed mentation/ responses ? Depressed affect  HEENT: nl dentition, turbinates, and orophanx. Nl external ear canals without cough reflex  Neck without JVD/Nodes/TM  Lungs insp and exp rhonchi, mild to moderate, slt reduced air movement. RRR no s3 or murmur or increase in P2, no sign edema  Abd soft and benign with nl excursion in the supine position.  Ext warm without calf tenderness, cyanosis clubbing mild/mod chronic venous insufficiency changes GU per urology Skin: mutiple coin sized/shaped  abrasions  Neuro: no motor deficits, nl gait     cxr 08/20/2010  Cardiomegaly with emphysema. Stable with no acute cardiopulmonary  findings.   Assessment:         Plan:

## 2010-08-20 NOTE — Assessment & Plan Note (Signed)
Check u/a > keep f/u appts with urology

## 2010-08-20 NOTE — Assessment & Plan Note (Signed)
Adequate control on present rx, reviewed  

## 2010-08-20 NOTE — Assessment & Plan Note (Signed)
Referred to nutrition

## 2010-08-21 NOTE — Progress Notes (Signed)
Quick Note:  Spoke with pt and notified of results per Dr. Wert. Pt verbalized understanding and denied any questions.  ______ 

## 2010-09-03 ENCOUNTER — Encounter: Payer: Medicare Other | Attending: Adult Health | Admitting: *Deleted

## 2010-09-03 DIAGNOSIS — E119 Type 2 diabetes mellitus without complications: Secondary | ICD-10-CM | POA: Insufficient documentation

## 2010-09-03 DIAGNOSIS — Z713 Dietary counseling and surveillance: Secondary | ICD-10-CM | POA: Insufficient documentation

## 2010-09-03 NOTE — Progress Notes (Signed)
  Medical Nutrition Therapy:  Appt start time: 1500 end time:  1400.  Assessment:  Primary concerns today: T2DM, new dx.  Pt with newly dx T2DM and an A1c of 8.0% here for assessment and education.  Previously attended education classes "years ago" with wife (dx w/ T2DM; deceased).  No evidence of CHO-counting at this time. Pt reports some positive dietary changes such as 0 soda intake and broiled fish instead of fried.  Does not currently exercise.  MEDICATIONS: Metformin, MVI, Citrucel, Vit D, Calcium+D, Lasix, K+, Miralax, Omeprazole, Pepcid; (See others in Medications)   DIETARY INTAKE 24-hr recall:  B ( AM):  Oatmeal w/ 1 pkt Equal; 8 oz water  Snk ( AM): none reported L ( PM): Subway steak & cheese on wheat (6"), small bag chips; diet soda or reg lemonade (gets 1 regular soda every 2-3 wks) Snk (PM): none reported D ( PM): Broiled tilapia Snk ( PM):  None reported  Usual physical activity: None  Estimated energy needs: 1700 calories 180 g carbohydrates 90-100 g protein 55 g fat  Progress Towards Goal(s):  NEW.   Nutritional Diagnosis:  [place]-2.1 Impaired nutrition utilization related to T2DM and excessive CHO intake as evidenced by food recall and an A1c of 8.0%.   Intervention/Goals:  Follow Diabetes Meal Plan as instructed (yellow card).  Eat 3 meals and 2-3 snacks; every 3-5 hrs.  Limit carbohydrate intake to 45-60 grams per meal and 15 grams per snack.  Add lean protein foods to all meals and snacks.  Aim for 20-30 mins of physical activity daily.  Avoid fried foods and refined/simple sugars.  Monitoring/Evaluation:  Dietary intake, exercise, A1c, blood glucose, and body weight in 4 week(s).

## 2010-09-03 NOTE — Patient Instructions (Addendum)
Goals:  Follow Diabetes Meal Plan as instructed (yellow card).  Eat 3 meals and 2-3 snacks; every 3-5 hrs.  Limit carbohydrate intake to 45-60 grams per meal and 15 grams per snack.  Add lean protein foods to all meals and snacks.  Aim for 20-30 mins of physical activity daily.  Avoid fried foods at restaurants.

## 2010-09-04 ENCOUNTER — Encounter: Payer: Self-pay | Admitting: *Deleted

## 2010-09-21 ENCOUNTER — Other Ambulatory Visit: Payer: Self-pay | Admitting: Adult Health

## 2010-09-21 ENCOUNTER — Other Ambulatory Visit: Payer: Self-pay | Admitting: Internal Medicine

## 2010-09-25 ENCOUNTER — Other Ambulatory Visit: Payer: Self-pay | Admitting: Oncology

## 2010-09-25 ENCOUNTER — Encounter (HOSPITAL_BASED_OUTPATIENT_CLINIC_OR_DEPARTMENT_OTHER): Payer: Medicare Other | Admitting: Oncology

## 2010-09-25 DIAGNOSIS — C8588 Other specified types of non-Hodgkin lymphoma, lymph nodes of multiple sites: Secondary | ICD-10-CM

## 2010-09-25 DIAGNOSIS — D649 Anemia, unspecified: Secondary | ICD-10-CM

## 2010-09-25 LAB — CBC WITH DIFFERENTIAL/PLATELET
BASO%: 0.3 % (ref 0.0–2.0)
EOS%: 2.4 % (ref 0.0–7.0)
HCT: 26 % — ABNORMAL LOW (ref 38.4–49.9)
MCH: 38.2 pg — ABNORMAL HIGH (ref 27.2–33.4)
MCHC: 34.8 g/dL (ref 32.0–36.0)
MONO#: 0.1 10*3/uL (ref 0.1–0.9)
RBC: 2.36 10*6/uL — ABNORMAL LOW (ref 4.20–5.82)
RDW: 16.5 % — ABNORMAL HIGH (ref 11.0–14.6)
WBC: 1.8 10*3/uL — ABNORMAL LOW (ref 4.0–10.3)
lymph#: 1 10*3/uL (ref 0.9–3.3)

## 2010-09-30 ENCOUNTER — Encounter: Payer: Medicare Other | Attending: Adult Health | Admitting: *Deleted

## 2010-09-30 ENCOUNTER — Encounter: Payer: Self-pay | Admitting: *Deleted

## 2010-09-30 DIAGNOSIS — E119 Type 2 diabetes mellitus without complications: Secondary | ICD-10-CM | POA: Insufficient documentation

## 2010-09-30 DIAGNOSIS — Z713 Dietary counseling and surveillance: Secondary | ICD-10-CM | POA: Insufficient documentation

## 2010-09-30 NOTE — Patient Instructions (Addendum)
Goals:  (Continue Previous Goals)  Follow Diabetes Meal Plan as instructed (yellow card).  Measure food portions and try to stay within ranges for meals.   Eat 3 meals and 2-3 snacks; every 3-5 hrs.  Limit carbohydrate intake to 45-60 grams per meal and 15 grams per snack.  Add lean protein foods to all meals and snacks.  Aim for 20-30 mins of physical activity daily.  Avoid fried foods and refined/simple sugars.

## 2010-09-30 NOTE — Progress Notes (Signed)
  Medical Nutrition Therapy:  Appt start time: 1500 end time:  1400.  Assessment:  Primary concerns today: T2DM, new dx.  Pt with newly dx T2DM and an A1c of 8.0% here for follow up.  Reports no dietary changes since last visit.  States he had one soda (reg coke) and  fried fish 1 time in last 4 weeks.  Continues to eat meals out does not count CHO. No structure activity noted at this time. Reports feeling fatigued often and that eating makes him feel like he has more energy.  MEDICATIONS: Metformin, MVI, Citrucel, Vit D, Calcium+D, Lasix, K+, Miralax, Omeprazole, Pepcid; (See others in Medications)   DIETARY INTAKE 24-hr recall:  B (AM): 1 pkt Oatmeal w/ raisins w/ 1 pkt Equal; 8 oz water  Snk (AM): None reported  L ( PM): [employer] - broiled fish or sandwich w/ chicken breast and mayo, pork & beans; water Snk (PM): Jello chocolate or vanilla pudding  D ( PM):[employer] - 1/2 of a medium; water Snk ( PM):  Rest of pizza  Usual physical activity:  No structured activity noted.  Discussed riding the stationary bike and effect it will have on his BG.  Estimated energy needs: 1700 calories 180 g carbohydrates 90-100 g protein 55 g fat  Progress Towards Goal(s):  No progress.   Nutritional Diagnosis:  Onset-2.1 Impaired nutrient utilization related to excessive CHO intake as evidenced by food recall and an A1c of 8.0%.   Intervention/Goals:  Follow Diabetes Meal Plan as instructed (yellow card).  Measure food portions and try to stay within ranges for meals.   Eat 3 meals and 2-3 snacks; every 3-5 hrs.  Limit carbohydrate intake to 45-60 grams per meal and 15 grams per snack.  Add lean protein foods to all meals and snacks.  Aim for 20-30 mins of physical activity daily.  Avoid fried foods and refined/simple sugars.  Monitoring/Evaluation:  Dietary intake, exercise, A1c, blood glucose, and body weight in 6 week(s).

## 2010-10-06 ENCOUNTER — Encounter: Payer: Self-pay | Admitting: Internal Medicine

## 2010-10-06 ENCOUNTER — Ambulatory Visit (INDEPENDENT_AMBULATORY_CARE_PROVIDER_SITE_OTHER): Payer: Medicare Other | Admitting: Internal Medicine

## 2010-10-06 ENCOUNTER — Other Ambulatory Visit (INDEPENDENT_AMBULATORY_CARE_PROVIDER_SITE_OTHER): Payer: Medicare Other

## 2010-10-06 VITALS — BP 128/64 | HR 87 | Temp 97.9°F | Ht 71.0 in | Wt 249.0 lb

## 2010-10-06 DIAGNOSIS — R609 Edema, unspecified: Secondary | ICD-10-CM

## 2010-10-06 DIAGNOSIS — E1365 Other specified diabetes mellitus with hyperglycemia: Secondary | ICD-10-CM

## 2010-10-06 DIAGNOSIS — IMO0002 Reserved for concepts with insufficient information to code with codable children: Secondary | ICD-10-CM

## 2010-10-06 LAB — HEMOGLOBIN A1C: Hgb A1c MFr Bld: 5.9 % (ref 4.6–6.5)

## 2010-10-06 LAB — BASIC METABOLIC PANEL
CO2: 29 mEq/L (ref 19–32)
Chloride: 102 mEq/L (ref 96–112)
Creatinine, Ser: 0.8 mg/dL (ref 0.4–1.5)
Potassium: 4 mEq/L (ref 3.5–5.1)
Sodium: 140 mEq/L (ref 135–145)

## 2010-10-06 NOTE — Progress Notes (Signed)
Subjective:     Patient ID: ANTHONYMICHAEL MUNDAY, male   DOB: Sep 18, 1932, 77 y.o.   MRN: [id number]  HPI   77 yowm quit smoking 1970 with morbid obesity with boderline DM, Lymphoma s/p chemo ([facility]) and tendency to peripheral edema that is felt to be dependent.   April 24, 2009--Returns for post hospital follow up. Admitted 04/11/2009- 04/15/2009 for CAP and leukopenia. CX were neg. CXR showed LLL infiltrate. He was tx w/ IV abx and aggressive pulmonary hygiene. He is doing much better w/ decreased dyspnea/cough. Has finished antibiotics. Is using flutter valve few times a day. Denies chest pain, orthopnea, hemoptysis, fever, n/v/d, edema, headache. His wbc did trend down during admission wbc 4.3>>1.9. Dr Clelia Croft following.   May 08, 2009 2 wk followup. Pt states that chest congestion is improving as well as his breathing. He states that he still feels very fatigued, still needing proaire up to 4 x daily whereas as previously only 4 x weekly. no purulent sputum. rx prednsione x 6 days> resolved   May 21, 2009 Acute visit c/o rash on left arm and left side onset April 22 Pt states rash is very itchy and sometimes painful. rec lotrisone   August 06, 2009 cc for the past month he is unable to walk at a brisk pace without getting out of breath. He still has dry cough "nothing new". very inactive no longer doing treadmill. no problem at night or sitting still. nasal congestion but not using nasonex as per calendar.   August 21, 2009--ov/ NP med review. pt brought all meds with him today. no new complaints. He is doing well. We reviewed his meds and organized them into med calendar. CXR last visit w/ no acute findings. Labs showed persistent pancytopenia. Has follow up w Dr. Frederich Chick > not due until Jan 2012   October 29, 2009 Followup. Pt c/o cough x 5 days- prod with minimal green sputum. Also he c/o sneezing. He has noticed some wheezing with exertion and also has had slight increase in SOB since  cough started. now needing saba whereas prev did not need it. Rx Doxycycline, steroid taper.    March 26, 2010 ov cc doe occ uses proaire, avg once every other day , no noct need, no purulent sputum.    >>no changes   04/30/10  Acute OV--Complains of chest congestion, dry cough, increased SOB, wheezing, x4days and fever 2 days ago. went to [facility] and ER on 04-28-10, was given neb tx only. CXR w/ no acute process, cbc unchanged. No UTI on urine. Pt says his cough started 4 days ago with barking cough to point of vomitting. Very hard to get up mucus. Started on Mucinex DM without much help. Has had fever-tmax 101.4. Daughter is with him today. He is eating and drinking well. No n/v/d, abdominal pain, urinary symptoms. Feels very worn out. Low energy.  Seen in ER but was discharged home without rx. He is no better today with persistent cough. No increased leg swelling. >>Dx with Bronchitis-tx w/ Avelox and Depo Medrol .   05/07/2010 ov f/u seen in ER on 04/28/10  with CXR neg for acute process. He was treated at our office on 4/4 with Avelox and Depo medrol . Did have persistent symptoms and called in on 4/7 -was called in steroid taper. He is slowly turning corner. He feels better but still weak, low energy. His cough/congestion are better.  Levaquin 500mg  daily for 7 days  Mucinex DM Twice daily  As needed  Cough/congestion  Prednisone taper over next week.  Hold sulindac while on prednisone.  Fluids and rest Tessalon 200mg  Three times a day  As needed   follow up Dr. [name]  In 2 weeks and As needed   06/06/2010 ov/ Kay Ricciuti cough has never really resolved since Jan 2012 some better on prednsione, ever since the winter, worse in am p wakes up > white mucus.  Assoc some nasal congestion and ok cxr on 4/2 x for HH.  On pred taper since 5/8 and levaquin.  Has lost calendar and seems confused when shown our copy, even with help of daughter.  No purulent sputum. No resting symtoms.   Leg swelling better on double dose  furosemide.  rec Nutrition consult for wt loss, dm control Sinus CT PFT's  On return  06/20/10 ov/NP med calendar  We are setting you up for a CT sinus > 1. No significant paranasal sinus disease.  We are referring you to a nutritionist to help with diabetes and weight loss Start metformin 500 mg bid    08/20/2010 cpx/ Ellamarie Naeve cc  No new or acute complaints but not fasting.  rec follow calendar Please schedule a follow up office visit in 6 weeks, call sooner if needed FASTING on REturn     10/06/2010 f/u ov/Christoph Copelan cc fatigue, no worse than usual, no hypersomnolence or symptoms of high or low sugars, sob.   Pt denies any significant sore throat, dysphagia, itching, sneezing,  nasal congestion or excess/ purulent secretions,  fever, chills, sweats, unintended wt loss, pleuritic or exertional cp, hempoptysis, orthopnea pnd or leg swelling.    Also denies any obvious fluctuation of symptoms with weather or environmental changes or other aggravating or alleviating factors.    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 04/[facility]....[facility]  - last chemo 10/08  -repeat CT/ABD CT 11/18/07--no reccurence  - Pancytopenia August 06, 2009 > refer back to [facility] > aranesp rx Jan 2012  RHINITIS, ALLERGIC NOS (ICD-477.9)  HYPERLIPIDEMIA NEC/NOS (ICD-272.4)  EXOGENOUS OBESITY (ICD-278.00)  - ideal body weight less than 186  AODM onset 2012 with freq pred for airways issues Vitamin D Deficiency- level 29>>44 (4/9//10)  Left Hip pain onset around 6/09............................................................................Marland KitchenHiltz  - MRI 11/23/07 c/w L1-2 bulging disc indents the thecal sac with foraminal stenosis  HEALTH MAINTENANCE..........................................................................................Marland KitchenWert  - Td 10/2005  - Pneumovax 10/07 second  shot  - CPX 08/20/2010  --colon 02/2005 -int/extern. hems (repeat q7y)  Complex med regimen  --Meds reviewed with pt education and computerized med calendar completed/adjusted. November 08, 2008 , August 21, 2009  Syncope...........................................................................................................Marland KitchenHochrein  - Mobitz II AV block s/p Medtronic pacemaker placement 12/11/2008  Dermatology.....................................................................................................[facility]  - Pruritic rash 04/2009 > tried lotrisone, referred to [facility] August 06, 2009    Family History:   mother had diabetes   Social History:  Patient states former smoker.  quit smoking in 1970  No ETOH  Retired          Objective:   Physical Exam wt 244 January 16, 2008 > 249 April 24, 2009 >   > 259 March 26, 2010 > 256 06/06/10 > 259 08/20/2010 > 249 10/06/2010  Ambulatory obese wm in no acute distress somewhat slowed mentation/ responses ? Depressed affect  HEENT: nl dentition, turbinates, and orophanx. Nl external ear canals without cough reflex  Neck without JVD/Nodes/TM  Lungs  insp and exp rhonchi, mild to moderate, slt reduced air movement. RRR no s3 or murmur or increase in P2, trace bilateral  edema  Abd soft and benign with nl excursion in the supine position.  Ext warm without calf tenderness, cyanosis clubbing mild/mod chronic venous insufficiency changes GU per urology Skin: mutiple coin sized/shaped  abrasions  Neuro: no motor deficits, nl gait     cxr 08/20/2010  Cardiomegaly with emphysema. Stable with no acute cardiopulmonary  findings.   Assessment:         Plan:

## 2010-10-06 NOTE — Patient Instructions (Signed)
See calendar for specific medication instructions and bring it back for each and every office visit for every healthcare provider you see.  Without it,  you may not receive the best quality medical care that we feel you deserve.  You will note that the calendar groups together  your maintenance  medications that are timed at particular times of the day.  Think of this as your checklist for what your doctor has instructed you to do until your next evaluation to see what benefit  there is  to staying on a consistent group of medications intended to keep you well.  The other group at the bottom is entirely up to you to use as you see fit  for specific symptoms that may arise between visits that require you to treat them on an as needed basis.  Think of this as your action plan or "what if" list.   Separating the top medications from the bottom group is fundamental to providing you adequate care going forward.    Weight control is simply a matter of calorie balance which needs to be tilted in your favor by eating less and exercising more.  To get the most out of exercise, you need to be continuously aware that you are short of breath, but never out of breath, for 30 minutes daily. As you improve, it will actually be easier for you to do the same amount of exercise  in  30 minutes so always push to the level where you are short of breath.  Think of your calorie balance like you do your bank account where in this case you want the balance to go down so you must take in less calories than you burn up.  It's just that simple:  Hard to do, but easy to understand.  Good luck!   Please schedule a follow up visit in 3 months but call sooner if needed

## 2010-10-07 ENCOUNTER — Encounter: Payer: Self-pay | Admitting: Internal Medicine

## 2010-10-07 NOTE — Assessment & Plan Note (Signed)
Adequate control on present rx, reviewed  

## 2010-10-07 NOTE — Assessment & Plan Note (Signed)
Much better on metformin which is having a positive influence on calorie balance and wt    Each maintenance medication was reviewed in detail including most importantly the difference between maintenance and as needed and under what circumstances the prns are to be used.  This was done in the context of a medication calendar review which provided the patient with a user-friendly unambiguous mechanism for medication administration and reconciliation and provides an action plan for all active problems. It is critical that this be shown to every doctor  for modification during the office visit if necessary so the patient can use it as a working document.

## 2010-10-07 NOTE — Progress Notes (Signed)
Quick Note:  Spoke with pt and notified of results per Dr. Wert. Pt verbalized understanding and denied any questions.  ______ 

## 2010-10-14 ENCOUNTER — Other Ambulatory Visit: Payer: Self-pay | Admitting: Internal Medicine

## 2010-10-21 ENCOUNTER — Other Ambulatory Visit: Payer: Self-pay | Admitting: *Deleted

## 2010-10-21 NOTE — Telephone Encounter (Signed)
rx already received.Carron Curie, CMA

## 2010-10-23 ENCOUNTER — Other Ambulatory Visit: Payer: Self-pay | Admitting: Oncology

## 2010-10-23 ENCOUNTER — Encounter (HOSPITAL_BASED_OUTPATIENT_CLINIC_OR_DEPARTMENT_OTHER): Payer: Medicare Other | Admitting: Oncology

## 2010-10-23 DIAGNOSIS — D539 Nutritional anemia, unspecified: Secondary | ICD-10-CM

## 2010-10-23 DIAGNOSIS — D649 Anemia, unspecified: Secondary | ICD-10-CM

## 2010-10-23 DIAGNOSIS — D709 Neutropenia, unspecified: Secondary | ICD-10-CM

## 2010-10-23 DIAGNOSIS — C8299 Follicular lymphoma, unspecified, extranodal and solid organ sites: Secondary | ICD-10-CM

## 2010-10-23 DIAGNOSIS — D61818 Other pancytopenia: Secondary | ICD-10-CM

## 2010-10-23 LAB — CBC WITH DIFFERENTIAL/PLATELET
BASO%: 0.4 % (ref 0.0–2.0)
Basophils Absolute: 0 10*3/uL (ref 0.0–0.1)
EOS%: 1.7 % (ref 0.0–7.0)
Eosinophils Absolute: 0 10*3/uL (ref 0.0–0.5)
HCT: 26.6 % — ABNORMAL LOW (ref 38.4–49.9)
HGB: 9.4 g/dL — ABNORMAL LOW (ref 13.0–17.1)
LYMPH%: 54.6 % — ABNORMAL HIGH (ref 14.0–49.0)
MCH: 38.5 pg — ABNORMAL HIGH (ref 27.2–33.4)
MCHC: 35.4 g/dL (ref 32.0–36.0)
MCV: 108.8 fL — ABNORMAL HIGH (ref 79.3–98.0)
MONO#: 0.1 10*3/uL (ref 0.1–0.9)
MONO%: 3.2 % (ref 0.0–14.0)
NEUT#: 0.7 10*3/uL — ABNORMAL LOW (ref 1.5–6.5)
NEUT%: 40.1 % (ref 39.0–75.0)
Platelets: 107 10*3/uL — ABNORMAL LOW (ref 140–400)
RBC: 2.45 10*6/uL — ABNORMAL LOW (ref 4.20–5.82)
RDW: 16.3 % — ABNORMAL HIGH (ref 11.0–14.6)
WBC: 1.6 10*3/uL — ABNORMAL LOW (ref 4.0–10.3)
lymph#: 0.9 10*3/uL (ref 0.9–3.3)

## 2010-10-28 LAB — URINALYSIS, ROUTINE W REFLEX MICROSCOPIC
Bilirubin Urine: NEGATIVE
Glucose, UA: NEGATIVE
Hgb urine dipstick: NEGATIVE
Ketones, ur: NEGATIVE
Nitrite: NEGATIVE
Protein, ur: NEGATIVE
Specific Gravity, Urine: 1.034 — ABNORMAL HIGH
Urobilinogen, UA: 0.2
pH: 5.5

## 2010-10-28 LAB — SPEP & IFE WITH QIG
Alpha-1-Globulin: 3 % (ref 2.9–4.9)
Alpha-2-Globulin: 10.1 % (ref 7.1–11.8)
Gamma Globulin: 15.9 % (ref 11.1–18.8)
IgM, Serum: 18 mg/dL — ABNORMAL LOW (ref 41–251)
Total Protein, Serum Electrophoresis: 6.8 g/dL (ref 6.0–8.3)

## 2010-10-28 LAB — KAPPA/LAMBDA LIGHT CHAINS: Kappa:Lambda Ratio: 0.28 (ref 0.26–1.65)

## 2010-11-12 ENCOUNTER — Ambulatory Visit: Payer: Medicare Other | Admitting: *Deleted

## 2010-11-13 LAB — CBC
Hemoglobin: 14.2
Platelets: 191
RDW: 13.9
WBC: 3.2 — ABNORMAL LOW

## 2010-11-20 ENCOUNTER — Other Ambulatory Visit: Payer: Self-pay | Admitting: Oncology

## 2010-11-20 ENCOUNTER — Telehealth: Payer: Self-pay | Admitting: Oncology

## 2010-11-20 ENCOUNTER — Encounter (HOSPITAL_BASED_OUTPATIENT_CLINIC_OR_DEPARTMENT_OTHER): Payer: Medicare Other | Admitting: Oncology

## 2010-11-20 DIAGNOSIS — D649 Anemia, unspecified: Secondary | ICD-10-CM

## 2010-11-20 DIAGNOSIS — D472 Monoclonal gammopathy: Secondary | ICD-10-CM

## 2010-11-20 DIAGNOSIS — C8299 Follicular lymphoma, unspecified, extranodal and solid organ sites: Secondary | ICD-10-CM

## 2010-11-20 DIAGNOSIS — D61818 Other pancytopenia: Secondary | ICD-10-CM

## 2010-11-20 DIAGNOSIS — C8588 Other specified types of non-Hodgkin lymphoma, lymph nodes of multiple sites: Secondary | ICD-10-CM

## 2010-11-20 LAB — CBC WITH DIFFERENTIAL/PLATELET
Basophils Absolute: 0 10*3/uL (ref 0.0–0.1)
Eosinophils Absolute: 0 10*3/uL (ref 0.0–0.5)
HCT: 25.7 % — ABNORMAL LOW (ref 38.4–49.9)
HGB: 9.1 g/dL — ABNORMAL LOW (ref 13.0–17.1)
LYMPH%: 55.4 % — ABNORMAL HIGH (ref 14.0–49.0)
MCH: 38.7 pg — ABNORMAL HIGH (ref 27.2–33.4)
MCV: 109 fL — ABNORMAL HIGH (ref 79.3–98.0)
MONO%: 2.4 % (ref 0.0–14.0)
NEUT#: 0.7 10*3/uL — ABNORMAL LOW (ref 1.5–6.5)
NEUT%: 39.4 % (ref 39.0–75.0)
Platelets: 114 10*3/uL — ABNORMAL LOW (ref 140–400)
RDW: 16.3 % — ABNORMAL HIGH (ref 11.0–14.6)

## 2010-11-20 NOTE — Telephone Encounter (Signed)
gv pt appt schedule for nov thru jan. °

## 2010-11-21 ENCOUNTER — Other Ambulatory Visit: Payer: Self-pay | Admitting: Internal Medicine

## 2010-11-27 ENCOUNTER — Ambulatory Visit (INDEPENDENT_AMBULATORY_CARE_PROVIDER_SITE_OTHER): Payer: Medicare Other | Admitting: Internal Medicine

## 2010-11-27 ENCOUNTER — Encounter: Payer: Self-pay | Admitting: Internal Medicine

## 2010-11-27 DIAGNOSIS — J309 Allergic rhinitis, unspecified: Secondary | ICD-10-CM

## 2010-11-27 DIAGNOSIS — J45909 Unspecified asthma, uncomplicated: Secondary | ICD-10-CM

## 2010-11-27 MED ORDER — PREDNISONE (PAK) 10 MG PO TABS: ORAL_TABLET | ORAL | Status: AC

## 2010-11-27 MED ORDER — LEVOFLOXACIN 750 MG PO TABS: 750.0000 mg | ORAL_TABLET | Freq: Every day | ORAL | Status: AC

## 2010-11-27 NOTE — Progress Notes (Signed)
Subjective:     Patient ID: [name], male   DOB: 1932/06/24, 77 y.o.   MRN: [id number]  HPI   73 yowm quit smoking 1970 with morbid obesity with boderline DM, Lymphoma s/p chemo ([facility]) and tendency to peripheral edema that is felt to be dependent.   April 24, 2009--Returns for post hospital follow up. Admitted 04/11/2009- 04/15/2009 for CAP and leukopenia. CX were neg. CXR showed LLL infiltrate. He was tx w/ IV abx and aggressive pulmonary hygiene. He is doing much better w/ decreased dyspnea/cough. Has finished antibiotics. Is using flutter valve few times a day. Denies chest pain, orthopnea, hemoptysis, fever, n/v/d, edema, headache. His wbc did trend down during admission wbc 4.3>>1.9. Dr Clelia Croft following.   May 08, 2009 2 wk followup. Pt states that chest congestion is improving as well as his breathing. He states that he still feels very fatigued, still needing proaire up to 4 x daily whereas as previously only 4 x weekly. no purulent sputum. rx prednsione x 6 days> resolved   May 21, 2009 Acute visit c/o rash on left arm and left side onset April 22 Pt states rash is very itchy and sometimes painful. rec lotrisone   August 06, 2009 cc for the past month he is unable to walk at a brisk pace without getting out of breath. He still has dry cough "nothing new". very inactive no longer doing treadmill. no problem at night or sitting still. nasal congestion but not using nasonex as per calendar.   August 21, 2009--ov/ NP med review. pt brought all meds with him today. no new complaints. He is doing well. We reviewed his meds and organized them into med calendar. CXR last visit w/ no acute findings. Labs showed persistent pancytopenia. Has follow up w Dr. Frederich Chick > not due until Jan 2012   October 29, 2009 Followup. Pt c/o cough x 5 days- prod with minimal green sputum. Also he c/o sneezing. He has noticed some wheezing with exertion and also has had slight increase in SOB since  cough started. now needing saba whereas prev did not need it. Rx Doxycycline, steroid taper.    March 26, 2010 ov cc doe occ uses proaire, avg once every other day , no noct need, no purulent sputum.    >>no changes   04/30/10  Acute OV--Complains of chest congestion, dry cough, increased SOB, wheezing, x4days and fever 2 days ago. went to [facility] and ER on 04-28-10, was given neb tx only. CXR w/ no acute process, cbc unchanged. No UTI on urine. Pt says his cough started 4 days ago with barking cough to point of vomitting. Very hard to get up mucus. Started on Mucinex DM without much help. Has had fever-tmax 101.4. Daughter is with him today. He is eating and drinking well. No n/v/d, abdominal pain, urinary symptoms. Feels very worn out. Low energy.  Seen in ER but was discharged home without rx. He is no better today with persistent cough. No increased leg swelling. >>Dx with Bronchitis-tx w/ Avelox and Depo Medrol .   05/07/2010 ov f/u seen in ER on 04/28/10  with CXR neg for acute process. He was treated at our office on 4/4 with Avelox and Depo medrol . Did have persistent symptoms and called in on 4/7 -was called in steroid taper. He is slowly turning corner. He feels better but still weak, low energy. His cough/congestion are better.  Levaquin 500mg  daily for 7 days  Mucinex DM Twice daily  As needed  Cough/congestion  Prednisone taper over next week.  Hold sulindac while on prednisone.  Fluids and rest Tessalon 200mg  Three times a day  As needed   follow up Dr. [name]  In 2 weeks and As needed   06/06/2010 ov/ Scarlet Abad cough has never really resolved since Jan 2012 some better on prednsione, ever since the winter, worse in am p wakes up > white mucus.  Assoc some nasal congestion and ok cxr on 4/2 x for HH.  On pred taper since 5/8 and levaquin.  Has lost calendar and seems confused when shown our copy, even with help of daughter.  No purulent sputum. No resting symtoms.   Leg swelling better on double dose  furosemide.  rec Nutrition consult for wt loss, dm control Sinus CT PFT's  On return  06/20/10 ov/NP med calendar  We are setting you up for a CT sinus > 1. No significant paranasal sinus disease.  We are referring you to a nutritionist to help with diabetes and weight loss Start metformin 500 mg bid    08/20/2010 cpx/ [name] cc  No new or acute complaints but not fasting.  rec follow calendar Please schedule a follow up office visit in 6 weeks, call sooner if needed FASTING on REturn     10/06/2010 f/u ov/[name] cc fatigue, no worse than usual, no hypersomnolence or symptoms of high or low sugars, sob. rec  Work on wt, follow med cal, no change rx  11/27/2010 f/u ov/[name] cc acute onset x3 d ha, sinus and chest congestion, did not follow contingencies listed on med calendar.  No purulent secretions or sob at this point   Pt denies any significant sore throat, dysphagia, itching, sneezing,  nasal congestion or excess/ purulent secretions,  fever, chills, sweats, unintended wt loss, pleuritic or exertional cp, hempoptysis, orthopnea pnd or leg swelling.    Also denies any obvious fluctuation of symptoms with weather or environmental changes or other aggravating or alleviating factors.    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 04/[facility].......[facility]  - last chemo 10/08  -repeat CT/ABD CT 11/18/07--no reccurence  - Pancytopenia August 06, 2009 > refer back to [facility] > aranesp rx Jan 2012  RHINITIS, ALLERGIC NOS (ICD-477.9)  HYPERLIPIDEMIA NEC/NOS (ICD-272.4)  EXOGENOUS OBESITY (ICD-278.00)  - ideal body weight less than 186  AODM onset 2012 with freq pred for airways issues Vitamin D Deficiency- level 29>>44 (4/9//10)  Left Hip pain onset around 6/09............................................................................Marland KitchenHiltz  - MRI 11/23/07 c/w  L1-2 bulging disc indents the thecal sac with foraminal stenosis  HEALTH MAINTENANCE..........................................................................................Marland KitchenWert  - Td 10/2005  - Pneumovax 10/07 second shot  - CPX 08/20/2010  --colon 02/2005 -int/extern. hems (repeat q7y)  Complex med regimen  --Meds reviewed with pt education and computerized med calendar completed/adjusted. November 08, 2008 , August 21, 2009  Syncope...........................................................................................................Marland KitchenHochrein  - Mobitz II AV block s/p Medtronic pacemaker placement 12/11/2008  Dermatology.....................................................................................................[facility]  - Pruritic rash 04/2009 > tried lotrisone, [facility] August 06, 2009    Family History:   mother had diabetes   Social History:  Patient states former smoker.  quit smoking in 1970  No ETOH  Retired          Objective:   Physical Exam wt 244 January 16, 2008 > 249 April 24, 2009 >   >  > 259 08/20/2010 > 249  10/06/2010 >  11/27/2010  245 Ambulatory obese wm in no acute distress somewhat slowed mentation/ responses ? Depressed affect  HEENT: nl dentition, turbinates, and orophanx. Nl external ear canals without cough reflex  Neck without JVD/Nodes/TM  Lungs insp and exp rhonchi, mild, with  slt reduced air movement. RRR no s3 or murmur or increase in P2, trace bilateral  edema  Abd soft and benign with nl excursion in the supine position.  Ext warm without calf tenderness, cyanosis clubbing mild/mod chronic venous insufficiency changes GU per urology Skin: no lesions  Neuro: no motor deficits, nl gait     cxr 08/20/2010  Cardiomegaly with emphysema. Stable with no acute cardiopulmonary  findings.   Assessment:         Plan:

## 2010-11-27 NOTE — Patient Instructions (Signed)
See calendar for specific medication instructions and bring it back for each and every office visit for every healthcare provider you see.  Without it,  you may not receive the best quality medical care that we feel you deserve.  You will note that the calendar groups together  your maintenance  medications that are timed at particular times of the day.  Think of this as your checklist for what your doctor has instructed you to do until your next evaluation to see what benefit  there is  to staying on a consistent group of medications intended to keep you well.  The other group at the bottom is entirely up to you to use as you see fit  for specific symptoms that may arise between visits that require you to treat them on an as needed basis.  Think of this as your action plan or "what if" list.   Separating the top medications from the bottom group is fundamental to providing you adequate care going forward.    Levquin 750 mg one daily x 5 days Prednisone 10 mg take  4 each am x 2 days,   2 each am x 2 days,  1 each am x2days and stop  Please schedule a follow up office visit in 6 weeks, call sooner if needed

## 2010-11-28 NOTE — Assessment & Plan Note (Signed)
Probably flare with uri and ? Early sinusitis   See instructions for specific recommendations which were reviewed directly with the patient who was given a copy with highlighter outlining the key components.

## 2010-11-28 NOTE — Assessment & Plan Note (Signed)
Adequate control on present rx, reviewed   The proper method of use, as well as anticipated side effects, of this metered-dose inhaler are discussed and demonstrated to the patient. Improved to 75% with coaching

## 2010-11-29 ENCOUNTER — Other Ambulatory Visit: Payer: Self-pay | Admitting: Adult Health

## 2010-12-14 ENCOUNTER — Other Ambulatory Visit: Payer: Self-pay | Admitting: Internal Medicine

## 2010-12-17 ENCOUNTER — Other Ambulatory Visit: Payer: Self-pay | Admitting: Oncology

## 2010-12-17 DIAGNOSIS — D649 Anemia, unspecified: Secondary | ICD-10-CM

## 2010-12-19 ENCOUNTER — Ambulatory Visit (HOSPITAL_BASED_OUTPATIENT_CLINIC_OR_DEPARTMENT_OTHER): Payer: Medicare Other

## 2010-12-19 ENCOUNTER — Other Ambulatory Visit: Payer: Self-pay | Admitting: Oncology

## 2010-12-19 ENCOUNTER — Other Ambulatory Visit (HOSPITAL_BASED_OUTPATIENT_CLINIC_OR_DEPARTMENT_OTHER): Payer: Medicare Other | Admitting: Lab

## 2010-12-19 VITALS — BP 132/86 | HR 92 | Temp 98.5°F

## 2010-12-19 DIAGNOSIS — D649 Anemia, unspecified: Secondary | ICD-10-CM

## 2010-12-19 LAB — CBC WITH DIFFERENTIAL/PLATELET
Basophils Absolute: 0 10*3/uL (ref 0.0–0.1)
EOS%: 2.2 % (ref 0.0–7.0)
Eosinophils Absolute: 0 10*3/uL (ref 0.0–0.5)
LYMPH%: 59 % — ABNORMAL HIGH (ref 14.0–49.0)
MCH: 37.5 pg — ABNORMAL HIGH (ref 27.2–33.4)
MCV: 108.3 fL — ABNORMAL HIGH (ref 79.3–98.0)
MONO%: 3 % (ref 0.0–14.0)
NEUT#: 0.7 10*3/uL — ABNORMAL LOW (ref 1.5–6.5)
Platelets: 124 10*3/uL — ABNORMAL LOW (ref 140–400)
RBC: 2.39 10*6/uL — ABNORMAL LOW (ref 4.20–5.82)

## 2010-12-19 MED ORDER — DARBEPOETIN ALFA-POLYSORBATE 500 MCG/ML IJ SOLN
300.0000 ug | Freq: Once | INTRAMUSCULAR | Status: AC
  Administered 2010-12-19: 300 ug via SUBCUTANEOUS
  Filled 2010-12-19: qty 1

## 2011-01-07 ENCOUNTER — Ambulatory Visit: Payer: Medicare Other

## 2011-01-07 ENCOUNTER — Other Ambulatory Visit (INDEPENDENT_AMBULATORY_CARE_PROVIDER_SITE_OTHER): Payer: Medicare Other

## 2011-01-07 ENCOUNTER — Ambulatory Visit (INDEPENDENT_AMBULATORY_CARE_PROVIDER_SITE_OTHER): Payer: Medicare Other | Admitting: Internal Medicine

## 2011-01-07 ENCOUNTER — Encounter: Payer: Self-pay | Admitting: Internal Medicine

## 2011-01-07 VITALS — BP 110/76 | HR 86 | Temp 97.4°F | Ht 71.0 in | Wt 241.0 lb

## 2011-01-07 DIAGNOSIS — E1365 Other specified diabetes mellitus with hyperglycemia: Secondary | ICD-10-CM

## 2011-01-07 DIAGNOSIS — J45909 Unspecified asthma, uncomplicated: Secondary | ICD-10-CM

## 2011-01-07 DIAGNOSIS — Z23 Encounter for immunization: Secondary | ICD-10-CM

## 2011-01-07 DIAGNOSIS — D649 Anemia, unspecified: Secondary | ICD-10-CM

## 2011-01-07 DIAGNOSIS — D72829 Elevated white blood cell count, unspecified: Secondary | ICD-10-CM

## 2011-01-07 DIAGNOSIS — I10 Essential (primary) hypertension: Secondary | ICD-10-CM

## 2011-01-07 LAB — CBC WITH DIFFERENTIAL/PLATELET
Basophils Absolute: 0 10*3/uL (ref 0.0–0.1)
Eosinophils Absolute: 0 10*3/uL (ref 0.0–0.7)
Eosinophils Relative: 2.2 % (ref 0.0–5.0)
HCT: 27.4 % — ABNORMAL LOW (ref 39.0–52.0)
Lymphs Abs: 1.2 10*3/uL (ref 0.7–4.0)
MCV: 108.7 fl — ABNORMAL HIGH (ref 78.0–100.0)
Monocytes Absolute: 0.1 10*3/uL (ref 0.1–1.0)
Neutrophils Relative %: 38.6 % — ABNORMAL LOW (ref 43.0–77.0)
Platelets: 139 10*3/uL — ABNORMAL LOW (ref 150.0–400.0)
RDW: 17.1 % — ABNORMAL HIGH (ref 11.5–14.6)
WBC: 2.1 10*3/uL — ABNORMAL LOW (ref 4.5–10.5)

## 2011-01-07 LAB — BASIC METABOLIC PANEL
BUN: 15 mg/dL (ref 6–23)
Chloride: 104 mEq/L (ref 96–112)
Creatinine, Ser: 0.9 mg/dL (ref 0.4–1.5)
Glucose, Bld: 109 mg/dL — ABNORMAL HIGH (ref 70–99)
Potassium: 4.3 mEq/L (ref 3.5–5.1)

## 2011-01-07 MED ORDER — TRAZODONE HCL 150 MG PO TABS: ORAL_TABLET | ORAL | Status: DC

## 2011-01-07 MED ORDER — METFORMIN HCL 500 MG PO TABS: 500.0000 mg | ORAL_TABLET | Freq: Two times a day (BID) | ORAL | Status: DC

## 2011-01-07 NOTE — Progress Notes (Signed)
Subjective:     Patient ID: [name], male   DOB: [birth year], 77 y.o.   MRN: [id number]  HPI   59 yowm quit smoking 1970 with morbid obesity with boderline DM, Lymphoma s/p chemo ([facility]) and tendency to peripheral edema that is felt to be dependent.   April 24, 2009--Returns for post hospital follow up. Admitted 04/11/2009- 04/15/2009 for CAP and leukopenia. CX were neg. CXR showed LLL infiltrate. He was tx w/ IV abx and aggressive pulmonary hygiene. He is doing much better w/ decreased dyspnea/cough. Has finished antibiotics. Is using flutter valve few times a day. Denies chest pain, orthopnea, hemoptysis, fever, n/v/d, edema, headache. His wbc did trend down during admission wbc 4.3>>1.9. Dr Clelia Croft following.   May 08, 2009 2 wk followup. Pt states that chest congestion is improving as well as his breathing. He states that he still feels very fatigued, still needing proaire up to 4 x daily whereas as previously only 4 x weekly. no purulent sputum. rx prednsione x 6 days> resolved   May 21, 2009 Acute visit c/o rash on left arm and left side onset April 22 Pt states rash is very itchy and sometimes painful. rec lotrisone   August 06, 2009 cc for the past month he is unable to walk at a brisk pace without getting out of breath. He still has dry cough "nothing new". very inactive no longer doing treadmill. no problem at night or sitting still. nasal congestion but not using nasonex as per calendar.   August 21, 2009--ov/ NP med review. pt brought all meds with him today. no new complaints. He is doing well. We reviewed his meds and organized them into med calendar. CXR last visit w/ no acute findings. Labs showed persistent pancytopenia. Has follow up w Dr. Frederich Chick > not due until Jan 2012   October 29, 2009 Followup. Pt c/o cough x 5 days- prod with minimal green sputum. Also he c/o sneezing. He has noticed some wheezing with exertion and also has had slight increase in SOB since  cough started. now needing saba whereas prev did not need it. Rx Doxycycline, steroid taper.    March 26, 2010 ov cc doe occ uses proaire, avg once every other day , no noct need, no purulent sputum.    >>no changes   04/30/10  Acute OV--Complains of chest congestion, dry cough, increased SOB, wheezing, x4days and fever 2 days ago. went to [facility] and ER on 04-28-10, was given neb tx only. CXR w/ no acute process, cbc unchanged. No UTI on urine. Pt says his cough started 4 days ago with barking cough to point of vomitting. Very hard to get up mucus. Started on Mucinex DM without much help. Has had fever-tmax 101.4. Daughter is with him today. He is eating and drinking well. No n/v/d, abdominal pain, urinary symptoms. Feels very worn out. Low energy.  Seen in ER but was discharged home without rx. He is no better today with persistent cough. No increased leg swelling. >>Dx with Bronchitis-tx w/ Avelox and Depo Medrol .   05/07/2010 ov f/u seen in ER on 04/28/10  with CXR neg for acute process. He was treated at our office on 4/4 with Avelox and Depo medrol . Did have persistent symptoms and called in on 4/7 -was called in steroid taper. He is slowly turning corner. He feels better but still weak, low energy. His cough/congestion are better.  Levaquin 500mg  daily for 7 days  Mucinex DM Twice daily  As needed  Cough/congestion  Prednisone taper over next week.  Hold sulindac while on prednisone.  Fluids and rest Tessalon 200mg  Three times a day  As needed   follow up Dr. [name]  In 2 weeks and As needed   06/06/2010 ov/ [name] cough has never really resolved since Jan 2012 some better on prednsione, ever since the winter, worse in am p wakes up > white mucus.  Assoc some nasal congestion and ok cxr on 4/2 x for HH.  On pred taper since 5/8 and levaquin.  Has lost calendar and seems confused when shown our copy, even with help of daughter.  No purulent sputum. No resting symtoms.   Leg swelling better on double dose  furosemide.  rec Nutrition consult for wt loss, dm control Sinus CT PFT's  On return  06/20/10 ov/NP med calendar  We are setting you up for a CT sinus > 1. No significant paranasal sinus disease.  We are referring you to a nutritionist to help with diabetes and weight loss Start metformin 500 mg bid    08/20/2010 cpx/ [name] cc  No new or acute complaints but not fasting.  rec follow calendar Please schedule a follow up office visit in 6 weeks, call sooner if needed FASTING on REturn  10/06/2010 f/u ov/[name] cc fatigue, no worse than usual, no hypersomnolence or symptoms of high or low sugars, sob. rec  Work on wt, follow med cal, no change rx  11/27/2010 f/u ov/[name] cc acute onset x3 d ha, sinus and chest congestion, did not follow contingencies listed on med calendar.  No purulent secretions or sob at this point rec Follow med calendar provided Levquin 750 mg one daily x 5 days Prednisone 10 mg take  4 each am x 2 days,   2 each am x 2 days,  1 each am x2days and stop  01/07/2011 f/u ov/[name] did bring most recent calendar cc doe x more than walking flat and slow and increased need for saba to 6x  Per day but Sleeping ok without nocturnal  or early am exacerbation  of respiratory  c/o's or need for noct saba. Also denies any obvious fluctuation of symptoms with weather or environmental changes or other aggravating or alleviating factors except as outlined above   ROS  At present neg for  any significant sore throat, dysphagia, itching, sneezing,  nasal congestion or excess/ purulent secretions,  fever, chills, sweats, unintended wt loss, pleuritic or exertional cp, hempoptysis, orthopnea pnd or leg swelling.  Also denies presyncope, palpitations, heartburn, abdominal pain, nausea, vomiting, diarrhea  or change in bowel or urinary habits, dysuria,hematuria,  rash, arthralgias, visual complaints, headache, numbness weakness or ataxia.     Past Medical History:  DYSPNEA (ICD-786.09)    LYMPHOMA NEC, MLIG, INGUINAL/LOWER LIMB (ICD-202.85) dx [facility]....[facility]  - last chemo 10/08  -repeat CT/ABD CT 11/18/07--no reccurence  - Pancytopenia August 06, 2009 > refer back to [facility] > aranesp rx Jan 2012  RHINITIS, ALLERGIC NOS (ICD-477.9)  HYPERLIPIDEMIA NEC/NOS (ICD-272.4)  EXOGENOUS OBESITY (ICD-278.00)  - ideal body weight less than 186  AODM onset 2012 with freq pred for airways issues Vitamin D Deficiency- level 29>>44 (4/9//10)  Left Hip pain onset around 6/09............................................................................[facility]ilt[facility]  - MRI 11/23/07 c/w L1-2 bulging disc indents the thecal sac with foraminal stenosis  HEALTH MAINTENANCE..........................................................................................[facility] KitchenWert  - Td 10/2005  - Pneumovax 10/07 second shot  - CPX 08/20/2010  --colon 02/2005 -int/extern. hems (repeat q7y)  Complex med regimen  --Meds reviewed with  pt education and computerized med calendar completed/adjusted. November 08, 2008 , August 21, 2009  Syncope...........................................................................................................Marland KitchenHochrein  - Mobitz II AV block s/p Medtronic pacemaker placement 12/11/2008  [facility]at[facility].....................................................................................................[facility]  - Pruritic rash 04/2009 > tried lotrisone, referred to [facility] August 06, 2009    Family History:   mother had diabetes   Social History:  Patient states former smoker.  quit smoking in 1970  No ETOH  Retired          Objective:   Physical Exam wt 244 January 16, 2008 > 249 April 24, 2009 > 249 10/06/2010 >  11/27/2010  245 > 01/07/2011 241   Ambulatory obese wm in no acute distress somewhat improved mentation/ responses /affect, more bright and cheerful  HEENT: nl dentition, turbinates, and orophanx. Nl external ear canals without cough reflex  Neck  without JVD/Nodes/TM  Lungs insp and exp rhonchi, mild, with  slt reduced air movement. RRR no s3 or murmur or increase in P2, trace bilateral  edema  Abd soft and benign with nl excursion in the supine position.  Ext warm without calf tenderness, cyanosis clubbing mild/mod chronic venous insufficiency changes      cxr 08/20/2010  Cardiomegaly with emphysema. Stable with no acute cardiopulmonary  findings.   Assessment:         Plan:

## 2011-01-07 NOTE — Patient Instructions (Signed)
See calendar for specific medication instructions and bring it back for each and every office visit for every healthcare provider you see.  Without it,  you may not receive the best quality medical care that we feel you deserve.  You will note that the calendar groups together  your maintenance  medications that are timed at particular times of the day.  Think of this as your checklist for what your doctor has instructed you to do until your next evaluation to see what benefit  there is  to staying on a consistent group of medications intended to keep you well.  The other group at the bottom is entirely up to you to use as you see fit  for specific symptoms that may arise between visits that require you to treat them on an as needed basis.  Think of this as your action plan or "what if" list.   Separating the top medications from the bottom group is fundamental to providing you adequate care going forward.    Please schedule a follow up visit in 6 weeks  but call sooner if needed to see Tammy with all medications in hand, your automatic maintenance medications vs as needed.

## 2011-01-08 LAB — PATHOLOGIST SMEAR REVIEW

## 2011-01-08 NOTE — Assessment & Plan Note (Signed)
  Lab 01/07/11 1504  HGB 9.7*    Hgb trending up, Adequate control on present rx, reviewed

## 2011-01-08 NOTE — Assessment & Plan Note (Signed)
Much better control on metformin and now loosing wt so Adequate control on present rx, reviewed

## 2011-01-08 NOTE — Assessment & Plan Note (Signed)
Adequate control on present rx, reviewed  

## 2011-01-08 NOTE — Assessment & Plan Note (Signed)
Symptoms not typical at all of asthma, strongly suspect obesity deconditioning and over use of albuterol    Each maintenance medication was reviewed in detail including most importantly the difference between maintenance and as needed and under This was done in the context of a medication calendar review which provided the patient with a user-friendly unambiguous mechanism for medication administration and reconciliation and provides an action plan for all active problems. It is critical that this be shown to every doctor  for modification during the office visit if necessary so the patient can use it as a working document. [name]

## 2011-01-08 NOTE — Progress Notes (Signed)
Quick Note:  Spoke with pt and notified of results per Dr. Wert. Pt verbalized understanding and denied any questions.  ______ 

## 2011-01-15 ENCOUNTER — Other Ambulatory Visit: Payer: Self-pay | Admitting: Oncology

## 2011-01-15 ENCOUNTER — Other Ambulatory Visit (HOSPITAL_BASED_OUTPATIENT_CLINIC_OR_DEPARTMENT_OTHER): Payer: Medicare Other | Admitting: Lab

## 2011-01-15 ENCOUNTER — Ambulatory Visit (HOSPITAL_BASED_OUTPATIENT_CLINIC_OR_DEPARTMENT_OTHER): Payer: Medicare Other

## 2011-01-15 VITALS — BP 134/77 | HR 80 | Temp 98.7°F

## 2011-01-15 DIAGNOSIS — D649 Anemia, unspecified: Secondary | ICD-10-CM

## 2011-01-15 DIAGNOSIS — C8588 Other specified types of non-Hodgkin lymphoma, lymph nodes of multiple sites: Secondary | ICD-10-CM

## 2011-01-15 LAB — CBC WITH DIFFERENTIAL/PLATELET
Basophils Absolute: 0 10*3/uL (ref 0.0–0.1)
EOS%: 2.3 % (ref 0.0–7.0)
HCT: 25.9 % — ABNORMAL LOW (ref 38.4–49.9)
HGB: 8.9 g/dL — ABNORMAL LOW (ref 13.0–17.1)
LYMPH%: 60 % — ABNORMAL HIGH (ref 14.0–49.0)
MCH: 36.2 pg — ABNORMAL HIGH (ref 27.2–33.4)
MCV: 105.3 fL — ABNORMAL HIGH (ref 79.3–98.0)
MONO%: 3.4 % (ref 0.0–14.0)
NEUT%: 34.3 % — ABNORMAL LOW (ref 39.0–75.0)
Platelets: 91 10*3/uL — ABNORMAL LOW (ref 140–400)

## 2011-01-15 MED ORDER — DARBEPOETIN ALFA-POLYSORBATE 500 MCG/ML IJ SOLN
300.0000 ug | Freq: Once | INTRAMUSCULAR | Status: AC
  Administered 2011-01-15: 300 ug via SUBCUTANEOUS
  Filled 2011-01-15: qty 1

## 2011-02-12 ENCOUNTER — Telehealth: Payer: Self-pay | Admitting: Oncology

## 2011-02-12 ENCOUNTER — Other Ambulatory Visit (HOSPITAL_BASED_OUTPATIENT_CLINIC_OR_DEPARTMENT_OTHER): Payer: Medicare Other | Admitting: Lab

## 2011-02-12 ENCOUNTER — Ambulatory Visit (HOSPITAL_BASED_OUTPATIENT_CLINIC_OR_DEPARTMENT_OTHER): Payer: Medicare Other | Admitting: Oncology

## 2011-02-12 VITALS — BP 125/80 | HR 80 | Temp 97.8°F | Ht 71.0 in | Wt 239.3 lb

## 2011-02-12 DIAGNOSIS — D649 Anemia, unspecified: Secondary | ICD-10-CM

## 2011-02-12 DIAGNOSIS — C8588 Other specified types of non-Hodgkin lymphoma, lymph nodes of multiple sites: Secondary | ICD-10-CM

## 2011-02-12 DIAGNOSIS — D472 Monoclonal gammopathy: Secondary | ICD-10-CM

## 2011-02-12 DIAGNOSIS — Z87898 Personal history of other specified conditions: Secondary | ICD-10-CM

## 2011-02-12 DIAGNOSIS — D61818 Other pancytopenia: Secondary | ICD-10-CM

## 2011-02-12 LAB — CBC WITH DIFFERENTIAL/PLATELET
BASO%: 0.6 % (ref 0.0–2.0)
HCT: 27.3 % — ABNORMAL LOW (ref 38.4–49.9)
LYMPH%: 64.6 % — ABNORMAL HIGH (ref 14.0–49.0)
MCHC: 34.6 g/dL (ref 32.0–36.0)
MCV: 108.3 fL — ABNORMAL HIGH (ref 79.3–98.0)
MONO%: 2.9 % (ref 0.0–14.0)
NEUT%: 30 % — ABNORMAL LOW (ref 39.0–75.0)
Platelets: 137 10*3/uL — ABNORMAL LOW (ref 140–400)
RBC: 2.52 10*6/uL — ABNORMAL LOW (ref 4.20–5.82)

## 2011-02-12 MED ORDER — DARBEPOETIN ALFA-POLYSORBATE 500 MCG/ML IJ SOLN
300.0000 ug | Freq: Once | INTRAMUSCULAR | Status: AC
  Administered 2011-02-12: 300 ug via SUBCUTANEOUS
  Filled 2011-02-12: qty 1

## 2011-02-12 NOTE — Telephone Encounter (Signed)
appts made for injs and md visit/lab,printed for pt  aom

## 2011-02-12 NOTE — Progress Notes (Signed)
Hematology and Oncology Follow Up Visit  [name] [id number] 21-Apr-1932 78 y.o. 02/12/2011 3:08 PM  CC: Victor Needle B. [name], MD, FCCP    Principle Diagnosis: A 78 year old gentleman with the following diagnoses:   1. Follicular lymphoma as a grade 3, stage III, diagnosed 2007, continued to be in remission. 2. Pancytopenia associated with mild neutropenia and macrocytosis possibly indicating early myelodysplasia. 3. Monoclonal gammopathy of undetermined significance.    Prior Therapy:  1. Status post CHOP and rituximab.  He had complete response to therapy concluded in 2007.  No further imaging has been indicated at this time. 2. Status post bone marrow biopsy in April 2011, did not really show any evidence of myelodysplasia or metastatic lymphoma at this time.  Current therapy: Aranesp 300 mcg every 4 weeks to keep his hemoglobin close to 11.  Interim History: [name] presents today for a followup visit.  He has continued to really do very well and have no major issues since the last time I saw him.  He had not had any hospitalization, had not had any illnesses.  Had not reported any evidence of bleeding.  Had not had any hospitalization or illnesses.     His activity level is about the same.  His energy is reasonable and had not had any other complications to report.  He does not report any dysphasia.  He does not report any lymphadenopathy.  Overall performance status, activity level remains stable and certainly it was helped by Aransep.  Medications: I have reviewed the patient's current medications. Current outpatient prescriptions:acetaminophen (TYLENOL) 500 MG tablet, 1 to 2 tabs every 4 to 6 hours as needed , Disp: , Rfl: ;  aspirin 81 MG tablet, Take 81 mg by mouth daily.  , Disp: , Rfl: ;  calcium-vitamin D (OS-CAL 500 + D) 500-200 MG-UNIT per tablet, Take 1 tablet by mouth 2 (two) times daily.  , Disp: , Rfl: ;  Cholecalciferol (VITAMIN D) 1000 UNITS capsule, Take 1,000 Units  by mouth 2 (two) times daily.  , Disp: , Rfl:  darbepoetin (ARANESP, ALB FREE, SURECLICK) 100 MCG/0.5ML SOLN, (unsure of dosage), Disp: , Rfl: ;  dextromethorphan-guaiFENesin (MUCINEX DM) 30-600 MG per 12 hr tablet, Take 1 tablet by mouth every 12 (twelve) hours as needed., Disp: , Rfl: ;  docusate sodium (STOOL SOFTENER) 100 MG capsule, as directed. , Disp: , Rfl: ;  famotidine (PEPCID) 20 MG tablet, Take 20 mg by mouth at bedtime.  , Disp: , Rfl:  finasteride (PROSCAR) 5 MG tablet, Take 5 mg by mouth daily.  , Disp: , Rfl: ;  furosemide (LASIX) 20 MG tablet, TAKE 1 TABLET EVERY MORNING, Disp: 34 tablet, Rfl: 1;  hydrocortisone 1 % cream, Apply topically 2 (two) times daily., Disp: , Rfl: ;  KLOR-CON M20 20 MEQ tablet, TAKE 1 TABLET DAILY, Disp: 34 tablet, Rfl: 11;  LEXAPRO 20 MG tablet, TAKE ONE-HALF TABLET BY MOUTH EVERY DAY, Disp: 30 each, Rfl: 6 metFORMIN (GLUCOPHAGE) 500 MG tablet, Take 1 tablet (500 mg total) by mouth 2 (two) times daily with a meal., Disp: 60 tablet, Rfl: 5;  methylcellulose (CITRUCEL) oral powder, 1 scoop in 8oz of water once daily, Disp: , Rfl: ;  Multiple Vitamin (MULTIVITAMIN) tablet, Take 1 tablet by mouth daily.  , Disp: , Rfl: ;  NASONEX 50 MCG/ACT nasal spray, USE 2 SPRAYS NASALLY TWICE DAILY, Disp: 1 Inhaler, Rfl: 3 omeprazole (PRILOSEC) 20 MG capsule, Take 20 mg by mouth daily with breakfast. , Disp: ,  Rfl: ;  oxymetazoline (AFRIN) 0.05 % nasal spray, Place 2 sprays into the nose 2 (two) times daily. X 5 days. And then stop, Disp: , Rfl: ;  polyethylene glycol powder (MIRALAX) powder, Take 17 g by mouth at bedtime.  , Disp: , Rfl: ;  PROAIR HFA 108 (90 BASE) MCG/ACT inhaler, INHALE 1 TO 2 PUFFS BY MOUTH EVERY 4 TO 6 HOURS AS NEEDED, Disp: 1 Inhaler, Rfl: 11 sulindac (CLINORIL) 200 MG tablet, TAKE ONE TABLET BY MOUTH TWICE DAILY AS NEEDED, Disp: 60 tablet, Rfl: 5;  traMADol (ULTRAM) 50 MG tablet, One every 4 hours if severe cough or pain, Disp: 40 tablet, Rfl: 0;  traZODone  (DESYREL) 150 MG tablet, 1/3 tab by mouth at bedtime., Disp: 30 tablet, Rfl: 1 Current facility-administered medications:darbepoetin alfa-polysorbate (ARANESP) injection 300 mcg, 300 mcg, Subcutaneous, Once, Eli Hose, MD;  levalbuterol Pauline Aus) nebulizer solution 0.63 mg, 0.63 mg, Nebulization, Once, Rubye Oaks, NP  Allergies: No Known Allergies  Past Medical History, Surgical history, Social history, and Family History were reviewed and updated.  Review of Systems: Constitutional:  Negative for fever, chills, night sweats, anorexia, weight loss, pain. Cardiovascular: no chest pain or dyspnea on exertion Respiratory: no cough, shortness of breath, or wheezing Neurological: no TIA or stroke symptoms Dermatological: negative ENT: negative Skin: Negative. Gastrointestinal: no abdominal pain, change in bowel habits, or black or bloody stools Genito-Urinary: no dysuria, trouble voiding, or hematuria Hematological and Lymphatic: negative Breast: negative Musculoskeletal: negative Remaining ROS negative. Physical Exam: Blood pressure 125/80, pulse 80, temperature 97.8 F (36.6 C), temperature source Oral, height 5\' 11"  (1.803 m), weight 239 lb 4.8 oz (108.546 kg). ECOG: 1 General appearance: alert Head: Normocephalic, without obvious abnormality, atraumatic Neck: no adenopathy, no carotid bruit, no JVD, supple, symmetrical, trachea midline and thyroid not enlarged, symmetric, no tenderness/mass/nodules Lymph nodes: Cervical, supraclavicular, and axillary nodes normal. Heart:regular rate and rhythm, S1, S2 normal, no murmur, click, rub or gallop Lung:chest clear, no wheezing, rales, normal symmetric air entry Abdomin: soft, non-tender, without masses or organomegaly EXT:no erythema, induration, or nodules   Lab Results: Lab Results  Component Value Date   WBC 1.9* 02/12/2011   HGB 9.4* 02/12/2011   HCT 27.3* 02/12/2011   MCV 108.3* 02/12/2011   PLT 137* 02/12/2011     Chemistry       Component Value Date/Time   NA 139 01/07/2011 1504   K 4.3 01/07/2011 1504   CL 104 01/07/2011 1504   CO2 29 01/07/2011 1504   BUN 15 01/07/2011 1504   CREATININE 0.9 01/07/2011 1504      Component Value Date/Time   CALCIUM 9.5 01/07/2011 1504   ALKPHOS 29* 07/29/2010 1416   AST 15 07/29/2010 1416   ALT 14 07/29/2010 1416   BILITOT 0.7 07/29/2010 1416         Impression and Plan:  This is a 78 year old gentleman with the following issues:  1. Follicular lymphoma diagnosed in 2007.  He has no evidence to suggest recurrent disease. 2. Pancytopenia of unclear etiology, probably myelodysplastic syndrome is the most likely etiology at this time.  His vitamin B12 and folate have been in the normal range.  He had a bone marrow biopsy recently in April of 2011; however, if his counts continue to decrease, which they have been stable recently, then certainly a repeat bone marrow biopsy would be indicated.  I feel for the time being supportive measure, even with a diagnosis of myelodysplasia, would be reasonable for [name].  3. Anemia.  He is to continue on Aranesp 300 mcg every three weeks to keep hemoglobin above 11. He will receive Aranesp today. 4. Monoclonal gammopathy.  His M-spike had been less than 1 g/dL, is unchanged.  Continue to monitor that every 6 months.   5. Followup will be set up in about 3 months.  [facility], MD 1/17/20133:08 PM

## 2011-02-13 ENCOUNTER — Other Ambulatory Visit: Payer: Self-pay | Admitting: Oncology

## 2011-02-13 DIAGNOSIS — D649 Anemia, unspecified: Secondary | ICD-10-CM

## 2011-02-17 ENCOUNTER — Other Ambulatory Visit: Payer: Self-pay | Admitting: Internal Medicine

## 2011-02-19 ENCOUNTER — Ambulatory Visit (INDEPENDENT_AMBULATORY_CARE_PROVIDER_SITE_OTHER): Payer: Medicare Other | Admitting: Adult Health

## 2011-02-19 ENCOUNTER — Telehealth: Payer: Self-pay | Admitting: Oncology

## 2011-02-19 ENCOUNTER — Encounter: Payer: Self-pay | Admitting: Adult Health

## 2011-02-19 ENCOUNTER — Other Ambulatory Visit: Payer: Self-pay | Admitting: Internal Medicine

## 2011-02-19 VITALS — BP 130/78 | HR 92 | Temp 96.8°F | Ht 71.0 in | Wt 238.8 lb

## 2011-02-19 DIAGNOSIS — IMO0002 Reserved for concepts with insufficient information to code with codable children: Secondary | ICD-10-CM

## 2011-02-19 DIAGNOSIS — E1365 Other specified diabetes mellitus with hyperglycemia: Secondary | ICD-10-CM

## 2011-02-19 DIAGNOSIS — I10 Essential (primary) hypertension: Secondary | ICD-10-CM

## 2011-02-19 DIAGNOSIS — J45909 Unspecified asthma, uncomplicated: Secondary | ICD-10-CM

## 2011-02-19 NOTE — Assessment & Plan Note (Addendum)
02/19/2011 Diabetes well controlled. Improved A1C @ 5.5 Patient does not check blood glucose levels at home.  Decrease Metformin to 500 mg, 1/2 tab by mouth twice daily to prevent hypoglycemic events.   Patient's medications were reviewed today and patient education was given. Computerized medication calendar was adjusted/completed

## 2011-02-19 NOTE — Progress Notes (Signed)
Subjective:     Patient ID: [name], male   DOB: 1932/06/24, 78 y.o.   MRN: [id number]  HPI   73 yowm quit smoking 1970 with morbid obesity with boderline DM, Lymphoma s/p chemo ([facility]) and tendency to peripheral edema that is felt to be dependent.   April 24, 2009--Returns for post hospital follow up. Admitted 04/11/2009- 04/15/2009 for CAP and leukopenia. CX were neg. CXR showed LLL infiltrate. He was tx w/ IV abx and aggressive pulmonary hygiene. He is doing much better w/ decreased dyspnea/cough. Has finished antibiotics. Is using flutter valve few times a day. Denies chest pain, orthopnea, hemoptysis, fever, n/v/d, edema, headache. His wbc did trend down during admission wbc 4.3>>1.9. Dr Clelia Croft following.   May 08, 2009 2 wk followup. Pt states that chest congestion is improving as well as his breathing. He states that he still feels very fatigued, still needing proaire up to 4 x daily whereas as previously only 4 x weekly. no purulent sputum. rx prednsione x 6 days> resolved   May 21, 2009 Acute visit c/o rash on left arm and left side onset April 22 Pt states rash is very itchy and sometimes painful. rec lotrisone   August 06, 2009 cc for the past month he is unable to walk at a brisk pace without getting out of breath. He still has dry cough "nothing new". very inactive no longer doing treadmill. no problem at night or sitting still. nasal congestion but not using nasonex as per calendar.   August 21, 2009--ov/ NP med review. pt brought all meds with him today. no new complaints. He is doing well. We reviewed his meds and organized them into med calendar. CXR last visit w/ no acute findings. Labs showed persistent pancytopenia. Has follow up w Dr. Frederich Chick > not due until Jan 2012   October 29, 2009 Followup. Pt c/o cough x 5 days- prod with minimal green sputum. Also he c/o sneezing. He has noticed some wheezing with exertion and also has had slight increase in SOB since  cough started. now needing saba whereas prev did not need it. Rx Doxycycline, steroid taper.    March 26, 2010 ov cc doe occ uses proaire, avg once every other day , no noct need, no purulent sputum.    >>no changes   04/30/10  Acute OV--Complains of chest congestion, dry cough, increased SOB, wheezing, x4days and fever 2 days ago. went to [facility] and ER on 04-28-10, was given neb tx only. CXR w/ no acute process, cbc unchanged. No UTI on urine. Pt says his cough started 4 days ago with barking cough to point of vomitting. Very hard to get up mucus. Started on Mucinex DM without much help. Has had fever-tmax 101.4. Daughter is with him today. He is eating and drinking well. No n/v/d, abdominal pain, urinary symptoms. Feels very worn out. Low energy.  Seen in ER but was discharged home without rx. He is no better today with persistent cough. No increased leg swelling. >>Dx with Bronchitis-tx w/ Avelox and Depo Medrol .   05/07/2010 ov f/u seen in ER on 04/28/10  with CXR neg for acute process. He was treated at our office on 4/4 with Avelox and Depo medrol . Did have persistent symptoms and called in on 4/7 -was called in steroid taper. He is slowly turning corner. He feels better but still weak, low energy. His cough/congestion are better.  Levaquin 500mg  daily for 7 days  Mucinex DM Twice daily  As needed  Cough/congestion  Prednisone taper over next week.  Hold sulindac while on prednisone.  Fluids and rest Tessalon 200mg  Three times a day  As needed   follow up Dr. [name]  In 2 weeks and As needed   06/06/2010 ov/ Wert cough has never really resolved since Jan 2012 some better on prednsione, ever since the winter, worse in am p wakes up > white mucus.  Assoc some nasal congestion and ok cxr on 4/2 x for HH.  On pred taper since 5/8 and levaquin.  Has lost calendar and seems confused when shown our copy, even with help of daughter.  No purulent sputum. No resting symtoms.   Leg swelling better on double dose  furosemide.  rec Nutrition consult for wt loss, dm control Sinus CT PFT's  On return  06/20/10 ov/NP med calendar  We are setting you up for a CT sinus > 1. No significant paranasal sinus disease.  We are referring you to a nutritionist to help with diabetes and weight loss Start metformin 500 mg bid    08/20/2010 cpx/ [facility] cc  No new or acute complaints but not fasting.  rec follow calendar Please schedule a follow up office visit in 6 weeks, call sooner if needed FASTING on REturn  10/06/2010 f/u ov/Wert cc fatigue, no worse than usual, no hypersomnolence or symptoms of high or low sugars, sob. rec  Work on wt, follow med cal, no change rx  11/27/2010 f/u ov/Wert cc acute onset x3 d ha, sinus and chest congestion, did not follow contingencies listed on med calendar.  No purulent secretions or sob at this point rec Follow med calendar provided Levquin 750 mg one daily x 5 days Prednisone 10 mg take  4 each am x 2 days,   2 each am x 2 days,  1 each am x2days and stop  01/07/2011 f/u ov/Wert did bring most recent calendar cc doe x more than walking flat and slow and increased need for saba to 6x  Per day but Sleeping ok without nocturnal  or early am exacerbation  of respiratory  c/o's or need for noct saba. Also denies any obvious fluctuation of symptoms with weather or environmental changes or other aggravating or alleviating factors except as outlined above  >>no changes   02/19/2011 Follow up and medication review  78 yo male for follow up office visit to review and update medication calender.   Reports following medication regimen closely. Daughter helps to manage medication and uses weekly pill box. Medications reviewed and patient education provided Appears he is taking meds correctly.   Lab results from 01/07/11 visit reviewed. A1C 5.5 . He does not check sugars at home  Unsure if any hypoglycemic events. No polyuria or polydipsia   ROS No fevers, chills or diaphoresis. No  recent illness. No dizziness, syncope, headache or visual changes. No sob, significant cough or mucus production.  No chest pain or palpitations. No abd pain or discomfort. No nausea, vomiting, diarrhea or constipation. No urinary urgency, frequency or incontinence.     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 04/[facility]....[facility]  - last chemo 10/08  -repeat CT/ABD CT 11/18/07--no reccurence  - Pancytopenia August 06, 2009 > refer back to [facility] > aranesp rx Jan 2012  RHINITIS, ALLERGIC NOS (ICD-477.9)  HYPERLIPIDEMIA NEC/NOS (ICD-272.4)  EXOGENOUS OBESITY (ICD-278.00)  - ideal body weight less than 186  AODM onset 2012 with freq pred for airways issues -  HgA1C 5.5 12/12 >metformin decreased 500mg  1/2 Twice daily   Vitamin D Deficiency- level 29>>44 (4/9//10)  Left Hip pain onset around 6/09............................................................................[facility]  - MRI 11/23/07 c/w L1-2 bulging disc indents the thecal sac with foraminal stenosis  HEALTH MAINTENANCE..........................................................................................Marland KitchenWert  - Td 10/2005  - Pneumovax 10/07 second shot  - CPX 08/20/2010  --colon 02/2005 -int/extern. hems (repeat q7y)  Complex med regimen  --Meds reviewed with pt education and computerized med calendar completed/adjusted. November 08, 2008 , August 21, 2009 updated 02/19/2011  Syncope...........................................................................................................Marland KitchenHochrein  - Mobitz II AV block s/p Medtronic pacemaker placement 12/11/2008  D[facility]at[facility].....................................................................................................[facility]  - Pruritic rash 04/2009 > tried lotrisone, referred to [facility] August 06, 2009    Family History:   mother had diabetes   Social History:  Patient states former smoker.  quit  smoking in 1970  No ETOH  Retired          Objective:   Physical Exam wt 244 January 16, 2008 > 249 April 24, 2009 > 249 10/06/2010 >  11/27/2010  245 > 01/07/2011 241 > 02/19/11 238  Ambulatory obese wm in no acute distress  HEENT: nl dentition, turbinates, and orophanx.  Mucus membranes pink and moist. No tongue or throat exudate.  Neck: supple with full ROM. no JVD, node enlargement or TMJ   Lungs: clear and equal bilateral breath sounds. No wheezing, rhonchi or rales.  Chest: RRR. No murmurs, rubs, or gallops. No peripheral edema.  Abd: soft and non tender. Normal excursion in the supine position.  Ext warm and dry, no calf tenderness, cyanosis or clubbing. mild/mod chronic venous insufficiency changes. Varicose veins present. Neuro: no focal neuro deficits.       Assessment:          Plan:

## 2011-02-19 NOTE — Assessment & Plan Note (Addendum)
02/19/2011 Compensated >96% spO2 on room air, no dyspnea at rest or with exertion  Continue to use ProAir as needed for shortness of breath

## 2011-02-19 NOTE — Assessment & Plan Note (Addendum)
02/19/2011 BP 130/78 Well controlled with medication, heart healthy diet and lifestyle modifications

## 2011-02-19 NOTE — Telephone Encounter (Signed)
Refill already sent 1/22

## 2011-02-19 NOTE — Telephone Encounter (Signed)
Added lbs to inj appts (staff message from [facility]). lmonvm for pt and mailed new schedule today.

## 2011-02-19 NOTE — Patient Instructions (Signed)
Follow med closely and bring to each visit.  Decrease Metformin 500 1/2 Twice daily  W/ food Low sweet /cholestrol diet.  follow up Dr. [name]  In 3 months and As needed

## 2011-02-23 MED ORDER — OXYMETAZOLINE HCL 0.05 % NA SOLN: NASAL | Status: DC

## 2011-02-23 MED ORDER — MOMETASONE FUROATE 50 MCG/ACT NA SUSP: NASAL | Status: DC

## 2011-02-23 MED ORDER — FUROSEMIDE 20 MG PO TABS: ORAL_TABLET | ORAL | Status: DC

## 2011-02-23 MED ORDER — METFORMIN HCL 500 MG PO TABS: ORAL_TABLET | ORAL | Status: DC

## 2011-02-23 MED ORDER — HYDROCORTISONE 1 % EX CREA: TOPICAL_CREAM | Freq: Two times a day (BID) | CUTANEOUS | Status: DC | PRN

## 2011-02-23 NOTE — Progress Notes (Signed)
Addended by: Boone Master E on: 02/23/2011 11:25 AM   Modules accepted: Orders

## 2011-03-05 ENCOUNTER — Other Ambulatory Visit (HOSPITAL_BASED_OUTPATIENT_CLINIC_OR_DEPARTMENT_OTHER): Payer: Medicare Other

## 2011-03-05 ENCOUNTER — Ambulatory Visit (HOSPITAL_BASED_OUTPATIENT_CLINIC_OR_DEPARTMENT_OTHER): Payer: Medicare Other

## 2011-03-05 DIAGNOSIS — I10 Essential (primary) hypertension: Secondary | ICD-10-CM

## 2011-03-05 DIAGNOSIS — K469 Unspecified abdominal hernia without obstruction or gangrene: Secondary | ICD-10-CM

## 2011-03-05 DIAGNOSIS — D649 Anemia, unspecified: Secondary | ICD-10-CM

## 2011-03-05 DIAGNOSIS — R059 Cough, unspecified: Secondary | ICD-10-CM

## 2011-03-05 DIAGNOSIS — R05 Cough: Secondary | ICD-10-CM

## 2011-03-05 DIAGNOSIS — E1365 Other specified diabetes mellitus with hyperglycemia: Secondary | ICD-10-CM

## 2011-03-05 DIAGNOSIS — J310 Chronic rhinitis: Secondary | ICD-10-CM

## 2011-03-05 DIAGNOSIS — D72819 Decreased white blood cell count, unspecified: Secondary | ICD-10-CM

## 2011-03-05 DIAGNOSIS — IMO0002 Reserved for concepts with insufficient information to code with codable children: Secondary | ICD-10-CM

## 2011-03-05 DIAGNOSIS — E785 Hyperlipidemia, unspecified: Secondary | ICD-10-CM

## 2011-03-05 DIAGNOSIS — C8595 Non-Hodgkin lymphoma, unspecified, lymph nodes of inguinal region and lower limb: Secondary | ICD-10-CM

## 2011-03-05 DIAGNOSIS — M2550 Pain in unspecified joint: Secondary | ICD-10-CM

## 2011-03-05 DIAGNOSIS — K219 Gastro-esophageal reflux disease without esophagitis: Secondary | ICD-10-CM

## 2011-03-05 DIAGNOSIS — E669 Obesity, unspecified: Secondary | ICD-10-CM

## 2011-03-05 DIAGNOSIS — N139 Obstructive and reflux uropathy, unspecified: Secondary | ICD-10-CM

## 2011-03-05 DIAGNOSIS — I872 Venous insufficiency (chronic) (peripheral): Secondary | ICD-10-CM

## 2011-03-05 DIAGNOSIS — E559 Vitamin D deficiency, unspecified: Secondary | ICD-10-CM

## 2011-03-05 DIAGNOSIS — R609 Edema, unspecified: Secondary | ICD-10-CM

## 2011-03-05 DIAGNOSIS — R21 Rash and other nonspecific skin eruption: Secondary | ICD-10-CM

## 2011-03-05 LAB — CBC WITH DIFFERENTIAL/PLATELET
Basophils Absolute: 0 10*3/uL (ref 0.0–0.1)
EOS%: 2.9 % (ref 0.0–7.0)
HCT: 28 % — ABNORMAL LOW (ref 38.4–49.9)
HGB: 9.8 g/dL — ABNORMAL LOW (ref 13.0–17.1)
LYMPH%: 57.2 % — ABNORMAL HIGH (ref 14.0–49.0)
MCH: 37.4 pg — ABNORMAL HIGH (ref 27.2–33.4)
NEUT%: 36.6 % — ABNORMAL LOW (ref 39.0–75.0)
Platelets: 132 10*3/uL — ABNORMAL LOW (ref 140–400)
lymph#: 1.1 10*3/uL (ref 0.9–3.3)

## 2011-03-05 MED ORDER — DARBEPOETIN ALFA-POLYSORBATE 300 MCG/0.6ML IJ SOLN
300.0000 ug | Freq: Once | INTRAMUSCULAR | Status: AC
  Administered 2011-03-05: 300 ug via SUBCUTANEOUS
  Filled 2011-03-05: qty 0.6

## 2011-03-11 ENCOUNTER — Telehealth: Payer: Self-pay | Admitting: Internal Medicine

## 2011-03-11 NOTE — Telephone Encounter (Signed)
03-11-11 lmm@ 930a for pt to call for pacemaker ck with allred/mt

## 2011-03-16 ENCOUNTER — Encounter: Payer: Self-pay | Admitting: Internal Medicine

## 2011-03-16 ENCOUNTER — Ambulatory Visit (INDEPENDENT_AMBULATORY_CARE_PROVIDER_SITE_OTHER): Payer: Medicare Other | Admitting: Internal Medicine

## 2011-03-16 DIAGNOSIS — I4891 Unspecified atrial fibrillation: Secondary | ICD-10-CM

## 2011-03-16 DIAGNOSIS — I480 Paroxysmal atrial fibrillation: Secondary | ICD-10-CM

## 2011-03-16 DIAGNOSIS — I441 Atrioventricular block, second degree: Secondary | ICD-10-CM

## 2011-03-16 HISTORY — DX: Paroxysmal atrial fibrillation: I48.0

## 2011-03-16 LAB — PACEMAKER DEVICE OBSERVATION
AL AMPLITUDE: 2.8 mv
BAMS-0001: 175 {beats}/min
BATTERY VOLTAGE: 2.79 V
VENTRICULAR PACING PM: 6

## 2011-03-16 NOTE — Patient Instructions (Addendum)
Your physician wants you to follow-up in: 6 months with device clinic and 12 months with Dr Allred You will receive a reminder letter in the mail two months in advance. If you don't receive a letter, please call our office to schedule the follow-up appointment.  

## 2011-03-16 NOTE — Assessment & Plan Note (Signed)
Pacemaker interrogation today reveals several episodes of afib Longest episode is 11 hours.  I have discussed risks for stroke with patient.  I think that we should ideally place him on pradaxa or xarelto as his CHADS2 score is 3 (age, htn, dm).  Unfortunately, he has recently had difficulty with anemia and has an ongoing workup by Dr [name] and Oncology.  If his hematocrit remains stable, then we should start pradaxa or xarelto upon return.

## 2011-03-16 NOTE — Assessment & Plan Note (Signed)
Normal pacemaker function See Pace Art report No changes today  

## 2011-03-16 NOTE — Progress Notes (Signed)
PCP:  Sandrea Hughs, MD, MD  The patient presents today for routine electrophysiology followup.  Since last being seen in our clinic, the patient reports doing very well.  He remains reasonably active for his age.  He enjoys watching his grandchildren play basketball and swim.  Today, he denies symptoms of palpitations, chest pain, shortness of breath, orthopnea, PND, lower extremity edema, dizziness, presyncope, syncope, or neurologic sequela.  The patient feels that he is tolerating medications without difficulties and is otherwise without complaint today.   Past Medical History  Diagnosis Date  . Dyspnea   . 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   . Allergic rhinitis   . Hyperlipidemia   . Exogenous obesity   . Edema   . Diabetes mellitus 2012    T2DM  . Second degree Mobitz II AV block 12/11/08    with transient syncope, resolved s/p PPM  . Paroxysmal atrial fibrillation 03/16/11    diagnosed by PPM interrogation   Past Surgical History  Procedure Date  . Lymph node biopsy     groin  . Sternotomy 2000  . Pericardiectomy 2000  . Penile prosthesis implant     and removal  . Pacemaker insertion 12/11/08    MDT implanted by Dr Johney Frame    Current Outpatient Prescriptions  Medication Sig Dispense Refill  . acetaminophen (TYLENOL) 500 MG tablet 1 to 2 tabs every 4 to 6 hours as needed       . aspirin 81 MG tablet Take 81 mg by mouth daily.        . calcium-vitamin D (OS-CAL 500 + D) 500-200 MG-UNIT per tablet Take 1 tablet by mouth 2 (two) times daily.        . Cholecalciferol (VITAMIN D) 1000 UNITS capsule Take 1,000 Units by mouth 2 (two) times daily.        [name] dextromethorphan-guaiFENesin (MUCINEX DM) 30-600 MG per 12 hr tablet Take 1 tablet by mouth every 12 (twelve) hours as needed.      . docusate sodium (STOOL SOFTENER) 100 MG capsule as directed.       . famotidine (PEPCID) 20 MG tablet Take 20 mg by mouth at bedtime.         . finasteride (PROSCAR) 5 MG tablet Take 5 mg by mouth daily.        . furosemide (LASIX) 20 MG tablet Take 1 tablet by mouth daily.  May take 1 extra daily as needed for swelling in legs.      . hydrocortisone cream 1 % Apply topically 2 (two) times daily as needed.      [name] KLOR-CON M20 20 MEQ tablet TAKE 1 TABLET DAILY  34 tablet  11  . LEXAPRO 20 MG tablet TAKE ONE-HALF TABLET BY MOUTH EVERY DAY  30 each  6  . metFORMIN (GLUCOPHAGE) 500 MG tablet 1/2 tab by mouth twice daily      . methylcellulose (CITRUCEL) oral powder 1 scoop in 8oz of water once daily      . mometasone (NASONEX) 50 MCG/ACT nasal spray 0-2 puffs in the morning and 1-2 puffs at bedtime      . Multiple Vitamin (MULTIVITAMIN) tablet Take 1 tablet by mouth daily.        [name] omeprazole (PRILOSEC) 20 MG capsule Take 20 mg by mouth daily with breakfast.       . oxymetazoline (AFRIN) 0.05 % nasal spray 2 puffs every 12 hours  needed for stuffy nose x 5 days and stop      . polyethylene glycol powder (MIRALAX) powder Take 17 g by mouth at bedtime.        [name] PROAIR HFA 108 (90 BASE) MCG/ACT inhaler INHALE 1 TO 2 PUFFS BY MOUTH EVERY 4 TO 6 HOURS AS NEEDED  1 Inhaler  11  . sulindac (CLINORIL) 200 MG tablet TAKE ONE TABLET BY MOUTH TWICE DAILY AS NEEDED  60 tablet  5  . traMADol (ULTRAM) 50 MG tablet One every 4 hours if severe cough or pain  40 tablet  0  . traZODone (DESYREL) 150 MG tablet 1/3 tab by mouth at bedtime.  30 tablet  1  . darbepoetin (ARANESP, ALB FREE, SURECLICK) 100 MCG/0.5ML SOLN (unsure of dosage)       Current Facility-Administered Medications  Medication Dose Route Frequency Provider Last Rate Last Dose  . levalbuterol (XOPENEX) nebulizer solution 0.63 mg  0.63 mg Nebulization Once Rubye Oaks, NP        No Known Allergies  History   Social History  . Marital Status: Widowed    Spouse Name: N/A    Number of Children: 2  . Years of Education: N/A   Occupational History  . retired    Social History  Main Topics  . Smoking status: Former Smoker -- 1.0 packs/day for 30 years    Types: Cigarettes    Quit date: 01/27/1968  . Smokeless tobacco: Former Neurosurgeon  . Alcohol Use: No  . Drug Use: No  . Sexually Active: Not on file   Other Topics Concern  . Not on file   Social History Narrative   [place]    Family History  Problem Relation Age of Onset  . Diabetes Mother   . Liver cancer Brother   . Cancer Sister     Physical Exam: Filed Vitals:   03/16/11 1150  BP: 104/64  Pulse: 65  Weight: 240 lb 6.4 oz (109.045 kg)    GEN- The patient is well appearing, alert and oriented x 3 today.   Head- normocephalic, atraumatic Eyes-  Sclera clear, conjunctiva pink Ears- hearing intact Oropharynx- clear Neck- supple, no JVP Lymph- no cervical lymphadenopathy Lungs- Clear to ausculation bilaterally, normal work of breathing Chest- pacemaker pocket is well healed Heart- Regular rate and rhythm, no murmurs, rubs or gallops, PMI not laterally displaced GI- soft, NT, ND, + BS Extremities- no clubbing, cyanosis, or edema   Pacemaker interrogation- reviewed in detail today,  See PACEART report  Assessment and Plan:

## 2011-03-26 ENCOUNTER — Ambulatory Visit (HOSPITAL_BASED_OUTPATIENT_CLINIC_OR_DEPARTMENT_OTHER): Payer: Medicare Other

## 2011-03-26 ENCOUNTER — Other Ambulatory Visit: Payer: Medicare Other | Admitting: Lab

## 2011-03-26 DIAGNOSIS — D649 Anemia, unspecified: Secondary | ICD-10-CM

## 2011-03-26 DIAGNOSIS — R21 Rash and other nonspecific skin eruption: Secondary | ICD-10-CM

## 2011-03-26 DIAGNOSIS — I872 Venous insufficiency (chronic) (peripheral): Secondary | ICD-10-CM

## 2011-03-26 DIAGNOSIS — K219 Gastro-esophageal reflux disease without esophagitis: Secondary | ICD-10-CM

## 2011-03-26 DIAGNOSIS — E559 Vitamin D deficiency, unspecified: Secondary | ICD-10-CM

## 2011-03-26 DIAGNOSIS — J310 Chronic rhinitis: Secondary | ICD-10-CM

## 2011-03-26 DIAGNOSIS — E1365 Other specified diabetes mellitus with hyperglycemia: Secondary | ICD-10-CM

## 2011-03-26 DIAGNOSIS — N139 Obstructive and reflux uropathy, unspecified: Secondary | ICD-10-CM

## 2011-03-26 DIAGNOSIS — K469 Unspecified abdominal hernia without obstruction or gangrene: Secondary | ICD-10-CM

## 2011-03-26 DIAGNOSIS — E669 Obesity, unspecified: Secondary | ICD-10-CM

## 2011-03-26 DIAGNOSIS — D72819 Decreased white blood cell count, unspecified: Secondary | ICD-10-CM

## 2011-03-26 DIAGNOSIS — R609 Edema, unspecified: Secondary | ICD-10-CM

## 2011-03-26 DIAGNOSIS — C8595 Non-Hodgkin lymphoma, unspecified, lymph nodes of inguinal region and lower limb: Secondary | ICD-10-CM

## 2011-03-26 DIAGNOSIS — R05 Cough: Secondary | ICD-10-CM

## 2011-03-26 DIAGNOSIS — E785 Hyperlipidemia, unspecified: Secondary | ICD-10-CM

## 2011-03-26 DIAGNOSIS — M2550 Pain in unspecified joint: Secondary | ICD-10-CM

## 2011-03-26 DIAGNOSIS — I10 Essential (primary) hypertension: Secondary | ICD-10-CM

## 2011-03-26 LAB — CBC WITH DIFFERENTIAL/PLATELET
Basophils Absolute: 0 10*3/uL (ref 0.0–0.1)
EOS%: 2.3 % (ref 0.0–7.0)
HCT: 28.4 % — ABNORMAL LOW (ref 38.4–49.9)
HGB: 9.6 g/dL — ABNORMAL LOW (ref 13.0–17.1)
MCH: 35.6 pg — ABNORMAL HIGH (ref 27.2–33.4)
MONO#: 0 10*3/uL — ABNORMAL LOW (ref 0.1–0.9)
NEUT%: 44.5 % (ref 39.0–75.0)
lymph#: 1.3 10*3/uL (ref 0.9–3.3)

## 2011-03-26 MED ORDER — DARBEPOETIN ALFA-POLYSORBATE 300 MCG/0.6ML IJ SOLN
300.0000 ug | Freq: Once | INTRAMUSCULAR | Status: AC
  Administered 2011-03-26: 300 ug via SUBCUTANEOUS
  Filled 2011-03-26: qty 0.6

## 2011-03-31 ENCOUNTER — Telehealth: Payer: Self-pay | Admitting: Internal Medicine

## 2011-03-31 NOTE — Telephone Encounter (Signed)
Pt's daughter reports that pt c/o vertigo, lightheadedness, and diff sleeping last night.  After eating breakfast pt's symptoms have improved some . Offered pt appt with TP today in [place] but pt wants to wait since he is feeling some better.  Pt instructed to call if symptoms do not improve.

## 2011-04-16 ENCOUNTER — Other Ambulatory Visit: Payer: Self-pay | Admitting: *Deleted

## 2011-04-16 ENCOUNTER — Ambulatory Visit (HOSPITAL_BASED_OUTPATIENT_CLINIC_OR_DEPARTMENT_OTHER): Payer: Medicare Other

## 2011-04-16 ENCOUNTER — Other Ambulatory Visit (HOSPITAL_BASED_OUTPATIENT_CLINIC_OR_DEPARTMENT_OTHER): Payer: Medicare Other

## 2011-04-16 DIAGNOSIS — E785 Hyperlipidemia, unspecified: Secondary | ICD-10-CM

## 2011-04-16 DIAGNOSIS — E1365 Other specified diabetes mellitus with hyperglycemia: Secondary | ICD-10-CM

## 2011-04-16 DIAGNOSIS — D649 Anemia, unspecified: Secondary | ICD-10-CM

## 2011-04-16 DIAGNOSIS — R05 Cough: Secondary | ICD-10-CM

## 2011-04-16 DIAGNOSIS — J310 Chronic rhinitis: Secondary | ICD-10-CM

## 2011-04-16 DIAGNOSIS — E559 Vitamin D deficiency, unspecified: Secondary | ICD-10-CM

## 2011-04-16 DIAGNOSIS — N139 Obstructive and reflux uropathy, unspecified: Secondary | ICD-10-CM

## 2011-04-16 DIAGNOSIS — R609 Edema, unspecified: Secondary | ICD-10-CM

## 2011-04-16 DIAGNOSIS — C8595 Non-Hodgkin lymphoma, unspecified, lymph nodes of inguinal region and lower limb: Secondary | ICD-10-CM

## 2011-04-16 DIAGNOSIS — K219 Gastro-esophageal reflux disease without esophagitis: Secondary | ICD-10-CM

## 2011-04-16 DIAGNOSIS — M2550 Pain in unspecified joint: Secondary | ICD-10-CM

## 2011-04-16 DIAGNOSIS — R059 Cough, unspecified: Secondary | ICD-10-CM

## 2011-04-16 DIAGNOSIS — R21 Rash and other nonspecific skin eruption: Secondary | ICD-10-CM

## 2011-04-16 DIAGNOSIS — IMO0002 Reserved for concepts with insufficient information to code with codable children: Secondary | ICD-10-CM

## 2011-04-16 DIAGNOSIS — K469 Unspecified abdominal hernia without obstruction or gangrene: Secondary | ICD-10-CM

## 2011-04-16 DIAGNOSIS — E669 Obesity, unspecified: Secondary | ICD-10-CM

## 2011-04-16 DIAGNOSIS — I10 Essential (primary) hypertension: Secondary | ICD-10-CM

## 2011-04-16 DIAGNOSIS — D72819 Decreased white blood cell count, unspecified: Secondary | ICD-10-CM

## 2011-04-16 DIAGNOSIS — I872 Venous insufficiency (chronic) (peripheral): Secondary | ICD-10-CM

## 2011-04-16 LAB — CBC WITH DIFFERENTIAL/PLATELET
Basophils Absolute: 0 10*3/uL (ref 0.0–0.1)
EOS%: 3.4 % (ref 0.0–7.0)
HCT: 27.4 % — ABNORMAL LOW (ref 38.4–49.9)
HGB: 9.6 g/dL — ABNORMAL LOW (ref 13.0–17.1)
LYMPH%: 54.6 % — ABNORMAL HIGH (ref 14.0–49.0)
MCH: 37.4 pg — ABNORMAL HIGH (ref 27.2–33.4)
MCV: 107.4 fL — ABNORMAL HIGH (ref 79.3–98.0)
MONO%: 2.7 % (ref 0.0–14.0)
NEUT%: 38.9 % — ABNORMAL LOW (ref 39.0–75.0)
Platelets: 130 10*3/uL — ABNORMAL LOW (ref 140–400)

## 2011-04-16 MED ORDER — DARBEPOETIN ALFA-POLYSORBATE 300 MCG/0.6ML IJ SOLN
300.0000 ug | Freq: Once | INTRAMUSCULAR | Status: AC
  Administered 2011-04-16: 300 ug via SUBCUTANEOUS
  Filled 2011-04-16: qty 0.6

## 2011-04-16 NOTE — Patient Instructions (Signed)
Pt in for injection.  No complaints. Pt discharged home ambulatory.

## 2011-04-17 ENCOUNTER — Telehealth: Payer: Self-pay | Admitting: Internal Medicine

## 2011-04-17 NOTE — Telephone Encounter (Signed)
I spoke with daughter and she is requesting a refill on pt's trazodone. This was last filled 01/07/11 #30 x 1 refill. Please advise Dr. [name], thanks

## 2011-04-18 NOTE — Telephone Encounter (Signed)
Ok for refills x one year

## 2011-04-20 MED ORDER — TRAZODONE HCL 150 MG PO TABS: ORAL_TABLET | ORAL | Status: DC

## 2011-04-20 NOTE — Telephone Encounter (Signed)
I spoke with stephanie and is aware rx has been sent. Nothing further was needed

## 2011-04-30 ENCOUNTER — Other Ambulatory Visit: Payer: Self-pay | Admitting: Adult Health

## 2011-04-30 ENCOUNTER — Other Ambulatory Visit: Payer: Self-pay | Admitting: Internal Medicine

## 2011-05-01 ENCOUNTER — Telehealth: Payer: Self-pay | Admitting: Internal Medicine

## 2011-05-01 MED ORDER — SULINDAC 200 MG PO TABS: 200.0000 mg | ORAL_TABLET | Freq: Two times a day (BID) | ORAL | Status: DC

## 2011-05-01 NOTE — Telephone Encounter (Signed)
Refill has been sent in to the pharmacy for the pt with 1 refill.  lmom to make pt aware.

## 2011-05-07 ENCOUNTER — Ambulatory Visit (HOSPITAL_BASED_OUTPATIENT_CLINIC_OR_DEPARTMENT_OTHER): Payer: Medicare Other | Admitting: Oncology

## 2011-05-07 ENCOUNTER — Telehealth: Payer: Self-pay | Admitting: Oncology

## 2011-05-07 ENCOUNTER — Other Ambulatory Visit (HOSPITAL_BASED_OUTPATIENT_CLINIC_OR_DEPARTMENT_OTHER): Payer: Medicare Other | Admitting: Lab

## 2011-05-07 VITALS — BP 128/73 | HR 85 | Temp 96.9°F | Ht 71.0 in | Wt 245.0 lb

## 2011-05-07 DIAGNOSIS — E1365 Other specified diabetes mellitus with hyperglycemia: Secondary | ICD-10-CM

## 2011-05-07 DIAGNOSIS — D61818 Other pancytopenia: Secondary | ICD-10-CM

## 2011-05-07 DIAGNOSIS — D72819 Decreased white blood cell count, unspecified: Secondary | ICD-10-CM

## 2011-05-07 DIAGNOSIS — D649 Anemia, unspecified: Secondary | ICD-10-CM

## 2011-05-07 DIAGNOSIS — I872 Venous insufficiency (chronic) (peripheral): Secondary | ICD-10-CM

## 2011-05-07 DIAGNOSIS — R609 Edema, unspecified: Secondary | ICD-10-CM

## 2011-05-07 DIAGNOSIS — R21 Rash and other nonspecific skin eruption: Secondary | ICD-10-CM

## 2011-05-07 DIAGNOSIS — D472 Monoclonal gammopathy: Secondary | ICD-10-CM

## 2011-05-07 DIAGNOSIS — J310 Chronic rhinitis: Secondary | ICD-10-CM

## 2011-05-07 DIAGNOSIS — E559 Vitamin D deficiency, unspecified: Secondary | ICD-10-CM

## 2011-05-07 DIAGNOSIS — M2550 Pain in unspecified joint: Secondary | ICD-10-CM

## 2011-05-07 DIAGNOSIS — R05 Cough: Secondary | ICD-10-CM

## 2011-05-07 DIAGNOSIS — E785 Hyperlipidemia, unspecified: Secondary | ICD-10-CM

## 2011-05-07 DIAGNOSIS — E669 Obesity, unspecified: Secondary | ICD-10-CM

## 2011-05-07 DIAGNOSIS — IMO0002 Reserved for concepts with insufficient information to code with codable children: Secondary | ICD-10-CM

## 2011-05-07 DIAGNOSIS — N139 Obstructive and reflux uropathy, unspecified: Secondary | ICD-10-CM

## 2011-05-07 DIAGNOSIS — K469 Unspecified abdominal hernia without obstruction or gangrene: Secondary | ICD-10-CM

## 2011-05-07 DIAGNOSIS — I10 Essential (primary) hypertension: Secondary | ICD-10-CM

## 2011-05-07 DIAGNOSIS — K219 Gastro-esophageal reflux disease without esophagitis: Secondary | ICD-10-CM

## 2011-05-07 DIAGNOSIS — R059 Cough, unspecified: Secondary | ICD-10-CM

## 2011-05-07 DIAGNOSIS — C8595 Non-Hodgkin lymphoma, unspecified, lymph nodes of inguinal region and lower limb: Secondary | ICD-10-CM

## 2011-05-07 LAB — CBC WITH DIFFERENTIAL/PLATELET
Basophils Absolute: 0 10*3/uL (ref 0.0–0.1)
EOS%: 2.9 % (ref 0.0–7.0)
Eosinophils Absolute: 0.1 10*3/uL (ref 0.0–0.5)
HCT: 28.9 % — ABNORMAL LOW (ref 38.4–49.9)
HGB: 10 g/dL — ABNORMAL LOW (ref 13.0–17.1)
MCH: 37.5 pg — ABNORMAL HIGH (ref 27.2–33.4)
NEUT#: 0.9 10*3/uL — ABNORMAL LOW (ref 1.5–6.5)
NEUT%: 48.9 % (ref 39.0–75.0)
RDW: 16.5 % — ABNORMAL HIGH (ref 11.0–14.6)
lymph#: 0.9 10*3/uL (ref 0.9–3.3)

## 2011-05-07 MED ORDER — DARBEPOETIN ALFA-POLYSORBATE 500 MCG/ML IJ SOLN
300.0000 ug | Freq: Once | INTRAMUSCULAR | Status: AC
  Administered 2011-05-07: 300 ug via SUBCUTANEOUS

## 2011-05-07 NOTE — Progress Notes (Signed)
Hematology and Oncology Follow Up Visit  [name] [id number] 02/16/1932 78 y.o. 05/07/2011 10:30 AM  CC: [name]. [name], MD, FCCP    Principle Diagnosis: A 78 year old gentleman with the following diagnoses:   1. Follicular lymphoma as a grade 3, stage III, diagnosed 2007, continued to be in remission. 2. Pancytopenia associated with mild neutropenia and macrocytosis possibly indicating early myelodysplasia. 3. Monoclonal gammopathy of undetermined significance.    Prior Therapy:  1. Status post CHOP and rituximab.  He had complete response to therapy concluded in 2007.  No further imaging has been indicated at this time. 2. Status post bone marrow biopsy in April 2011, did not really show any evidence of myelodysplasia or metastatic lymphoma at this time.  Current therapy: Aranesp 300 mcg every 3 weeks to keep his hemoglobin close to 11.  Interim History: [name] presents today for a followup visit.  He has continued to really do very well and have no major issues since the last time I saw him.  He had not had any hospitalization, had not had any illnesses.  Had not reported any evidence of bleeding.  Had not had any infection. His activity level is about the same.  His energy is reasonable and had not had any other complications to report.  He does not report any dysphasia.  He does not report any lymphadenopathy.  Overall performance status, activity level remains stable and certainly it was helped by Aransep.  Medications: I have reviewed the patient's current medications. Current outpatient prescriptions:acetaminophen (TYLENOL) 500 MG tablet, 1 to 2 tabs every 4 to 6 hours as needed , Disp: , Rfl: ;  aspirin 81 MG tablet, Take 81 mg by mouth daily.  , Disp: , Rfl: ;  calcium-vitamin D (OS-CAL 500 + D) 500-200 MG-UNIT per tablet, Take 1 tablet by mouth 2 (two) times daily.  , Disp: , Rfl: ;  Cholecalciferol (VITAMIN D) 1000 UNITS capsule, Take 1,000 Units by mouth 2 (two) times  daily.  , Disp: , Rfl:  darbepoetin (ARANESP, ALB FREE, SURECLICK) 100 MCG/0.5ML SOLN, (unsure of dosage), Disp: , Rfl: ;  dextromethorphan-guaiFENesin (MUCINEX DM) 30-600 MG per 12 hr tablet, Take 1 tablet by mouth every 12 (twelve) hours as needed., Disp: , Rfl: ;  docusate sodium (STOOL SOFTENER) 100 MG capsule, as directed. , Disp: , Rfl: ;  famotidine (PEPCID) 20 MG tablet, Take 20 mg by mouth at bedtime.  , Disp: , Rfl:  finasteride (PROSCAR) 5 MG tablet, Take 5 mg by mouth daily.  , Disp: , Rfl: ;  furosemide (LASIX) 20 MG tablet, Take 1 tablet by mouth daily.  May take 1 extra daily as needed for swelling in legs., Disp: , Rfl: ;  furosemide (LASIX) 20 MG tablet, TAKE 1 TABLET EVERY MORNING, Disp: 34 tablet, Rfl: 5;  hydrocortisone cream 1 %, Apply topically 2 (two) times daily as needed., Disp: , Rfl:  KLOR-CON M20 20 MEQ tablet, TAKE 1 TABLET DAILY, Disp: 34 tablet, Rfl: 11;  LEXAPRO 20 MG tablet, TAKE ONE-HALF TABLET BY MOUTH EVERY DAY, Disp: 30 each, Rfl: 6;  metFORMIN (GLUCOPHAGE) 500 MG tablet, 1/2 tab by mouth twice daily, Disp: , Rfl: ;  methylcellulose (CITRUCEL) oral powder, 1 scoop in 8oz of water once daily, Disp: , Rfl: ;  mometasone (NASONEX) 50 MCG/ACT nasal spray, 0-2 puffs in the morning and 1-2 puffs at bedtime, Disp: , Rfl:  Multiple Vitamin (MULTIVITAMIN) tablet, Take 1 tablet by mouth daily.  , Disp: ,  Rfl: ;  NASONEX 50 MCG/ACT nasal spray, USE 2 SPRAYS NASALLY TWICE DAILY, Disp: 17 g, Rfl: 3;  omeprazole (PRILOSEC) 20 MG capsule, Take 20 mg by mouth daily with breakfast. , Disp: , Rfl: ;  oxymetazoline (AFRIN) 0.05 % nasal spray, 2 puffs every 12 hours needed for stuffy nose x 5 days and stop, Disp: , Rfl:  polyethylene glycol powder (MIRALAX) powder, Take 17 g by mouth at bedtime.  , Disp: , Rfl: ;  PROAIR HFA 108 (90 BASE) MCG/ACT inhaler, INHALE 1 TO 2 PUFFS BY MOUTH EVERY 4 TO 6 HOURS AS NEEDED, Disp: 1 Inhaler, Rfl: 11;  sulindac (CLINORIL) 200 MG tablet, Take 1 tablet (200  mg total) by mouth 2 (two) times daily., Disp: 60 tablet, Rfl: 1;  traMADol (ULTRAM) 50 MG tablet, One every 4 hours if severe cough or pain, Disp: 40 tablet, Rfl: 0 traZODone (DESYREL) 150 MG tablet, 1/3 tab by mouth at bedtime., Disp: 30 tablet, Rfl: 12 Current facility-administered medications:levalbuterol (XOPENEX) nebulizer solution 0.63 mg, 0.63 mg, Nebulization, Once, Tammy S Parrett, NP  Allergies: No Known Allergies  Past Medical History, Surgical history, Social history, and Family History were reviewed and updated.  Review of Systems: Constitutional:  Negative for fever, chills, night sweats, anorexia, weight loss, pain. Cardiovascular: no chest pain or dyspnea on exertion Respiratory: no cough, shortness of breath, or wheezing Neurological: no TIA or stroke symptoms Dermatological: negative ENT: negative Skin: Negative. Gastrointestinal: no abdominal pain, change in bowel habits, or black or bloody stools Genito-Urinary: no dysuria, trouble voiding, or hematuria Hematological and Lymphatic: negative Breast: negative Musculoskeletal: negative Remaining ROS negative. Physical Exam: Blood pressure 128/73, pulse 85, temperature 96.9 F (36.1 C), temperature source Oral, height 5\' 11"  (1.803 m), weight 245 lb (111.131 kg). ECOG: 1 General appearance: alert Head: Normocephalic, without obvious abnormality, atraumatic Neck: no adenopathy, no carotid bruit, no JVD, supple, symmetrical, trachea midline and thyroid not enlarged, symmetric, no tenderness/mass/nodules Lymph nodes: Cervical, supraclavicular, and axillary nodes normal. Heart:regular rate and rhythm, S1, S2 normal, no murmur, click, rub or gallop Lung:chest clear, no wheezing, rales, normal symmetric air entry Abdomin: soft, non-tender, without masses or organomegaly EXT:no erythema, induration, or nodules   Lab Results: Lab Results  Component Value Date   WBC 1.9* 05/07/2011   HGB 10.0* 05/07/2011   HCT 28.9*  05/07/2011   MCV 108.5* 05/07/2011   PLT 111* 05/07/2011     Chemistry      Component Value Date/Time   NA 139 01/07/2011 1504   K 4.3 01/07/2011 1504   CL 104 01/07/2011 1504   CO2 29 01/07/2011 1504   BUN 15 01/07/2011 1504   CREATININE 0.9 01/07/2011 1504      Component Value Date/Time   CALCIUM 9.5 01/07/2011 1504   ALKPHOS 29* 07/29/2010 1416   AST 15 07/29/2010 1416   ALT 14 07/29/2010 1416   BILITOT 0.7 07/29/2010 1416         Impression and Plan:  This is a 78 year old gentleman with the following issues:  1. Follicular lymphoma diagnosed in 2007.  He has no evidence to suggest recurrent disease. 2. Pancytopenia of unclear etiology, probably myelodysplastic syndrome is the most likely etiology at this time.  His vitamin B12 and folate have been in the normal range.  He had a bone marrow biopsy recently in April of 2011; however, if his counts continue to decrease, which they have been stable recently, then certainly a repeat bone marrow biopsy would be indicated.  I feel for the time being supportive measure, even with a diagnosis of myelodysplasia, would be reasonable for [name].   3. Anemia.  He is to continue on Aranesp 300 mcg every three weeks to keep hemoglobin above 11. He will receive Aranesp today. 4. Monoclonal gammopathy.  His M-spike had been less than 1 g/dL, is unchanged.  Continue to monitor that every 6 months.   5. Followup will be set up in about 3-4 months.  [facility], MD 4/11/201310:30 AM

## 2011-05-07 NOTE — Telephone Encounter (Signed)
appts made and printed for pt aom °

## 2011-05-07 NOTE — Progress Notes (Signed)
Addended by: Reesa Chew on: 05/07/2011 10:52 AM   Modules accepted: Orders

## 2011-05-11 ENCOUNTER — Telehealth: Payer: Self-pay | Admitting: Internal Medicine

## 2011-05-11 LAB — COMPREHENSIVE METABOLIC PANEL
Albumin: 4.4 g/dL (ref 3.5–5.2)
Alkaline Phosphatase: 33 U/L — ABNORMAL LOW (ref 39–117)
BUN: 20 mg/dL (ref 6–23)
CO2: 28 mEq/L (ref 19–32)
Calcium: 9.7 mg/dL (ref 8.4–10.5)
Chloride: 102 mEq/L (ref 96–112)
Glucose, Bld: 191 mg/dL — ABNORMAL HIGH (ref 70–99)
Potassium: 3.9 mEq/L (ref 3.5–5.3)
Sodium: 139 mEq/L (ref 135–145)
Total Protein: 6.9 g/dL (ref 6.0–8.3)

## 2011-05-11 LAB — SPEP & IFE WITH QIG
Albumin ELP: 63.3 % (ref 55.8–66.1)
Beta 2: 2.7 % — ABNORMAL LOW (ref 3.2–6.5)
Beta Globulin: 5.6 % (ref 4.7–7.2)
Gamma Globulin: 15.7 % (ref 11.1–18.8)
IgA: 54 mg/dL — ABNORMAL LOW (ref 68–379)
IgG (Immunoglobin G), Serum: 1410 mg/dL (ref 650–1600)
IgM, Serum: 18 mg/dL — ABNORMAL LOW (ref 41–251)

## 2011-05-11 MED ORDER — SULINDAC 200 MG PO TABS: 200.0000 mg | ORAL_TABLET | Freq: Two times a day (BID) | ORAL | Status: DC

## 2011-05-11 NOTE — Telephone Encounter (Signed)
Was sent to [facility] instead of [facility]-I have called and cancelled Rx at [facility] and resent to [facility]. Elba Barman is aware of change.

## 2011-05-11 NOTE — Telephone Encounter (Signed)
Rx was sent on 05-01-2011 to pharmacy.

## 2011-05-14 ENCOUNTER — Other Ambulatory Visit: Payer: Self-pay | Admitting: Adult Health

## 2011-05-19 ENCOUNTER — Other Ambulatory Visit: Payer: Self-pay | Admitting: Adult Health

## 2011-05-19 NOTE — Telephone Encounter (Signed)
Electronic refill request received for pt's trazodone 150mg  1/3 tab qhs.  Per pt's chart, this was filled x1 yr by [facility] on 3.22.13.  Called [facility] spoke with pharmacist who stated that rx was never received.  Gave verbal authorization over the phone.

## 2011-05-20 ENCOUNTER — Ambulatory Visit: Payer: Medicare Other | Admitting: Internal Medicine

## 2011-05-27 ENCOUNTER — Other Ambulatory Visit: Payer: Self-pay | Admitting: Oncology

## 2011-05-28 ENCOUNTER — Ambulatory Visit (HOSPITAL_BASED_OUTPATIENT_CLINIC_OR_DEPARTMENT_OTHER): Payer: Medicare Other

## 2011-05-28 ENCOUNTER — Other Ambulatory Visit (HOSPITAL_BASED_OUTPATIENT_CLINIC_OR_DEPARTMENT_OTHER): Payer: Medicare Other | Admitting: Lab

## 2011-05-28 VITALS — BP 119/72 | HR 70 | Temp 97.1°F

## 2011-05-28 DIAGNOSIS — E785 Hyperlipidemia, unspecified: Secondary | ICD-10-CM

## 2011-05-28 DIAGNOSIS — R21 Rash and other nonspecific skin eruption: Secondary | ICD-10-CM

## 2011-05-28 DIAGNOSIS — D649 Anemia, unspecified: Secondary | ICD-10-CM

## 2011-05-28 DIAGNOSIS — E669 Obesity, unspecified: Secondary | ICD-10-CM

## 2011-05-28 DIAGNOSIS — J310 Chronic rhinitis: Secondary | ICD-10-CM

## 2011-05-28 DIAGNOSIS — IMO0002 Reserved for concepts with insufficient information to code with codable children: Secondary | ICD-10-CM

## 2011-05-28 DIAGNOSIS — E1365 Other specified diabetes mellitus with hyperglycemia: Secondary | ICD-10-CM

## 2011-05-28 DIAGNOSIS — R609 Edema, unspecified: Secondary | ICD-10-CM

## 2011-05-28 DIAGNOSIS — I872 Venous insufficiency (chronic) (peripheral): Secondary | ICD-10-CM

## 2011-05-28 DIAGNOSIS — C8595 Non-Hodgkin lymphoma, unspecified, lymph nodes of inguinal region and lower limb: Secondary | ICD-10-CM

## 2011-05-28 DIAGNOSIS — N139 Obstructive and reflux uropathy, unspecified: Secondary | ICD-10-CM

## 2011-05-28 DIAGNOSIS — R05 Cough: Secondary | ICD-10-CM

## 2011-05-28 DIAGNOSIS — I10 Essential (primary) hypertension: Secondary | ICD-10-CM

## 2011-05-28 DIAGNOSIS — K219 Gastro-esophageal reflux disease without esophagitis: Secondary | ICD-10-CM

## 2011-05-28 DIAGNOSIS — M2550 Pain in unspecified joint: Secondary | ICD-10-CM

## 2011-05-28 DIAGNOSIS — K469 Unspecified abdominal hernia without obstruction or gangrene: Secondary | ICD-10-CM

## 2011-05-28 DIAGNOSIS — D72819 Decreased white blood cell count, unspecified: Secondary | ICD-10-CM

## 2011-05-28 DIAGNOSIS — E559 Vitamin D deficiency, unspecified: Secondary | ICD-10-CM

## 2011-05-28 DIAGNOSIS — R059 Cough, unspecified: Secondary | ICD-10-CM

## 2011-05-28 LAB — CBC WITH DIFFERENTIAL/PLATELET
BASO%: 0.6 % (ref 0.0–2.0)
Basophils Absolute: 0 10*3/uL (ref 0.0–0.1)
EOS%: 2.4 % (ref 0.0–7.0)
HCT: 27.8 % — ABNORMAL LOW (ref 38.4–49.9)
MCH: 37.6 pg — ABNORMAL HIGH (ref 27.2–33.4)
MCHC: 34.6 g/dL (ref 32.0–36.0)
MCV: 108.7 fL — ABNORMAL HIGH (ref 79.3–98.0)
MONO%: 2.7 % (ref 0.0–14.0)
NEUT%: 39.7 % (ref 39.0–75.0)
RDW: 16.1 % — ABNORMAL HIGH (ref 11.0–14.6)
lymph#: 1 10*3/uL (ref 0.9–3.3)

## 2011-05-28 MED ORDER — DARBEPOETIN ALFA-POLYSORBATE 300 MCG/0.6ML IJ SOLN
300.0000 ug | Freq: Once | INTRAMUSCULAR | Status: AC
  Administered 2011-05-28: 300 ug via SUBCUTANEOUS
  Filled 2011-05-28: qty 0.6

## 2011-06-04 ENCOUNTER — Encounter: Payer: Self-pay | Admitting: Internal Medicine

## 2011-06-04 ENCOUNTER — Ambulatory Visit (INDEPENDENT_AMBULATORY_CARE_PROVIDER_SITE_OTHER): Payer: Medicare Other | Admitting: Internal Medicine

## 2011-06-04 ENCOUNTER — Other Ambulatory Visit (INDEPENDENT_AMBULATORY_CARE_PROVIDER_SITE_OTHER): Payer: Medicare Other

## 2011-06-04 VITALS — BP 126/72 | HR 92 | Temp 97.9°F | Ht 71.0 in | Wt 247.6 lb

## 2011-06-04 DIAGNOSIS — M549 Dorsalgia, unspecified: Secondary | ICD-10-CM

## 2011-06-04 DIAGNOSIS — E669 Obesity, unspecified: Secondary | ICD-10-CM

## 2011-06-04 DIAGNOSIS — R609 Edema, unspecified: Secondary | ICD-10-CM

## 2011-06-04 DIAGNOSIS — E1365 Other specified diabetes mellitus with hyperglycemia: Secondary | ICD-10-CM

## 2011-06-04 DIAGNOSIS — I10 Essential (primary) hypertension: Secondary | ICD-10-CM

## 2011-06-04 MED ORDER — TRAMADOL HCL 50 MG PO TABS: ORAL_TABLET | ORAL | Status: AC

## 2011-06-04 NOTE — Assessment & Plan Note (Signed)
Adequate control on present rx, reviewed  

## 2011-06-04 NOTE — Assessment & Plan Note (Signed)
No evidence of def radicular features, reveiwed use of sulindac and tramadol > referred back to [facility]    Each maintenance medication was reviewed in detail including most importantly the difference between maintenance and as needed and under what circumstances the prns are to be used. This was done in the context of a medication calendar review which provided the patient with a user-friendly unambiguous mechanism for medication administration and reconciliation and provides an action plan for all active problems. It is critical that this be shown to every doctor  for modification during the office visit if necessary so the patient can use it as a working document.

## 2011-06-04 NOTE — Progress Notes (Signed)
Subjective:     Patient ID: [name], male   DOB: 1932/06/24, 78 y.o.   MRN: [id number]  HPI   73 yowm quit smoking 1970 with morbid obesity with boderline DM, Lymphoma s/p chemo ([facility]) and tendency to peripheral edema that is felt to be dependent.   April 24, 2009--Returns for post hospital follow up. Admitted 04/11/2009- 04/15/2009 for CAP and leukopenia. CX were neg. CXR showed LLL infiltrate. He was tx w/ IV abx and aggressive pulmonary hygiene. He is doing much better w/ decreased dyspnea/cough. Has finished antibiotics. Is using flutter valve few times a day. Denies chest pain, orthopnea, hemoptysis, fever, n/v/d, edema, headache. His wbc did trend down during admission wbc 4.3>>1.9. Dr Clelia Croft following.   May 08, 2009 2 wk followup. Pt states that chest congestion is improving as well as his breathing. He states that he still feels very fatigued, still needing proaire up to 4 x daily whereas as previously only 4 x weekly. no purulent sputum. rx prednsione x 6 days> resolved   May 21, 2009 Acute visit c/o rash on left arm and left side onset April 22 Pt states rash is very itchy and sometimes painful. rec lotrisone   August 06, 2009 cc for the past month he is unable to walk at a brisk pace without getting out of breath. He still has dry cough "nothing new". very inactive no longer doing treadmill. no problem at night or sitting still. nasal congestion but not using nasonex as per calendar.   August 21, 2009--ov/ NP med review. pt brought all meds with him today. no new complaints. He is doing well. We reviewed his meds and organized them into med calendar. CXR last visit w/ no acute findings. Labs showed persistent pancytopenia. Has follow up w Dr. Frederich Chick > not due until Jan 2012   October 29, 2009 Followup. Pt c/o cough x 5 days- prod with minimal green sputum. Also he c/o sneezing. He has noticed some wheezing with exertion and also has had slight increase in SOB since  cough started. now needing saba whereas prev did not need it. Rx Doxycycline, steroid taper.    March 26, 2010 ov cc doe occ uses proaire, avg once every other day , no noct need, no purulent sputum.    >>no changes   04/30/10  Acute OV--Complains of chest congestion, dry cough, increased SOB, wheezing, x4days and fever 2 days ago. went to [facility] and ER on 04-28-10, was given neb tx only. CXR w/ no acute process, cbc unchanged. No UTI on urine. Pt says his cough started 4 days ago with barking cough to point of vomitting. Very hard to get up mucus. Started on Mucinex DM without much help. Has had fever-tmax 101.4. Daughter is with him today. He is eating and drinking well. No n/v/d, abdominal pain, urinary symptoms. Feels very worn out. Low energy.  Seen in ER but was discharged home without rx. He is no better today with persistent cough. No increased leg swelling. >>Dx with Bronchitis-tx w/ Avelox and Depo Medrol .   05/07/2010 ov f/u seen in ER on 04/28/10  with CXR neg for acute process. He was treated at our office on 4/4 with Avelox and Depo medrol . Did have persistent symptoms and called in on 4/7 -was called in steroid taper. He is slowly turning corner. He feels better but still weak, low energy. His cough/congestion are better.  Levaquin 500mg  daily for 7 days  Mucinex DM Twice daily  As needed  Cough/congestion  Prednisone taper over next week.  Hold sulindac while on prednisone.  Fluids and rest Tessalon 200mg  Three times a day  As needed   follow up Dr. [name]  In 2 weeks and As needed   06/06/2010 ov/ [name] cough has never really resolved since Jan 2012 some better on prednsione, ever since the winter, worse in am p wakes up > white mucus.  Assoc some nasal congestion and ok cxr on 4/2 x for HH.  On pred taper since 5/8 and levaquin.  Has lost calendar and seems confused when shown our copy, even with help of daughter.  No purulent sputum. No resting symtoms.   Leg swelling better on double dose  furosemide.  rec Nutrition consult for wt loss, dm control Sinus CT PFT's  On return  06/20/10 ov/NP med calendar  We are setting you up for a CT sinus > 1. No significant paranasal sinus disease.  We are referring you to a nutritionist to help with diabetes and weight loss Start metformin 500 mg bid    08/20/2010 cpx/ [name] cc  No new or acute complaints but not fasting.  rec follow calendar Please schedule a follow up office visit in 6 weeks, call sooner if needed FASTING on REturn  10/06/2010 f/u ov/[name] cc fatigue, no worse than usual, no hypersomnolence or symptoms of high or low sugars, sob. rec  Work on wt, follow med cal, no change rx  11/27/2010 f/u ov/[name] cc acute onset x3 d ha, sinus and chest congestion, did not follow contingencies listed on med calendar.  No purulent secretions or sob at this point rec Follow med calendar provided Levquin 750 mg one daily x 5 days Prednisone 10 mg take  4 each am x 2 days,   2 each am x 2 days,  1 each am x2days and stop  01/07/2011 f/u ov/[name] did bring most recent calendar cc doe x more than walking flat and slow and increased need for saba to 6x  Per day but Sleeping ok without nocturnal  or early am exacerbation  of respiratory  c/o's or need for noct saba. Also denies any obvious fluctuation of symptoms with weather or environmental changes or other aggravating or alleviating factors except as outlined above  >>no changes   02/19/2011 Follow up and medication review / NP - Reports following medication regimen closely. Daughter helps to manage medication and uses weekly pill box. Medications reviewed and patient education provided Appears he is taking meds correctly.  Lab results from 01/07/11 visit reviewed. A1C 5.5 . He does not check sugars at home  Unsure if any hypoglycemic events. No polyuria or polydipsia rec Follow med closely and bring to each visit.  Decrease Metformin 500 1/2 Twice daily  W/ food Low sweet /cholestrol  diet   06/04/2011 f/u ov/[name] cc intermittent localized back pain x 10 years comes and goes in past,  came and stayed starting in Jan 2013 = pattern is daily, better sitting and lying down, worse with wt bearing with some rad down R leg but not to foot.  Better p injections/ PT under Dr Hiltz's direction.  No worse with cough.  No paresthesias, no symptoms to suggest high or low sugars, thinks metformin may get him nauseated when tries to take it at the baseball game with his hot dog.  No abd pain, no wt loss  Sleeping ok without nocturnal  or early am exacerbation  of respiratory  c/o's . Also  denies any obvious fluctuation of symptoms with weather or environmental changes or other aggravating or alleviating factors except as outlined above   ROS  At present neg for  any significant sore throat, dysphagia, dental problems, itching, sneezing,  nasal congestion or excess/ purulent secretions, ear ache,   fever, chills, sweats, unintended wt loss, pleuritic or exertional cp, hemoptysis, palpitations, orthopnea pnd or leg swelling.  Also denies presyncope, palpitations, heartburn, abdominal pain, anorexia, nausea, vomiting, diarrhea  or change in bowel or urinary habits, change in stools or urine, dysuria,hematuria,  rash, arthralgias, visual complaints, headache, numbness weakness or ataxia or problems with walking or coordination. No noted change in mood/affect or memory.                          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   Hiltz  - MRI 11/23/2007 c/w L1-2 bulging disc indents the thecal sac with foraminal stenosis  HEALTH MAINTENANCE..........................................................................................Marland KitchenWert  - Td 10/2005  - Pneumovax 10/07 second shot  - CPX 08/20/2010  --colon 02/2005 -int/extern. hems (repeat q7y)  Complex med regimen  --Meds reviewed with pt education and computerized med calendar completed/adjusted. November 08, 2008 , August 21, 2009 updated 02/19/2011  Syncope...........................................................................................................Marland KitchenHochrein  - Mobitz II AV block s/p Medtronic pacemaker placement 12/11/2008  Dermatology.....................................................................................................[facility]  - Pruritic rash 04/2009 > tried lotrisone, referred to [facility] August 06, 2009    Family History:   mother had diabetes   Social History:  Patient states former smoker.  quit smoking in 1970  No ETOH  Retired          Objective:   Physical Exam wt 244 January 16, 2008 > 249 April 24, 2009 > 249 10/06/2010 >  11/27/2010  245 > 01/07/2011 241 > 02/19/11 238 > 06/04/2011  247   Ambulatory obese wm in no acute distress  HEENT: nl dentition, turbinates, and orophanx.  Mucus membranes pink and moist. No tongue or throat exudate.  Neck: supple with full ROM. no JVD, node enlargement or TMJ   Lungs: clear and equal bilateral breath sounds. No wheezing, rhonchi or rales.  Chest: RRR. No murmurs, rubs, or gallops. No peripheral edema.  Abd: soft and non tender. Normal excursion in the supine position.  Ext warm and dry, no calf tenderness, cyanosis or clubbing. mild/mod chronic venous insufficiency changes. Varicose veins  present. Neuro: no focal neuro deficits.  MS min tender para lumbar, neg slr, nl gait      Assessment:          Plan:

## 2011-06-04 NOTE — Assessment & Plan Note (Signed)
Not Adequate control on present rx, reviewed - despite report of nausea ? From med, still gaining wt in absence of edema.  Limited options to help at this point.

## 2011-06-04 NOTE — Assessment & Plan Note (Signed)
-   New onset 2012 while on freq doses of pred for airways dz >>Nutritionist referral 06/20/2010  And started on metfomin 07/02/10 -02/19/2011 metformin decreased to 500mg  1/2 Twice daily   Lab Results  Component Value Date   HGBA1C 6.1 06/04/2011   Lab Results  Component Value Date   CREATININE 0.91 05/07/2011   CREATININE 0.9 01/07/2011   CREATININE 0.8 10/06/2010     Adequate control on present rx, reviewed - no change metformin for now

## 2011-06-04 NOTE — Patient Instructions (Addendum)
See calendar for specific medication instructions and bring it back for each and every office visit for every healthcare provider you see.  Without it,  you may not receive the best quality medical care that we feel you deserve.  You will note that the calendar groups together  your maintenance  medications that are timed at particular times of the day.  Think of this as your checklist for what your doctor has instructed you to do until your next evaluation to see what benefit  there is  to staying on a consistent group of medications intended to keep you well.  The other group at the bottom is entirely up to you to use as you see fit  for specific symptoms that may arise between visits that require you to treat them on an as needed basis.  Think of this as your action plan or "what if" list.   Separating the top medications from the bottom group is fundamental to providing you adequate care going forward.    Take metformin immediately after eating bfast and supper but  skip the evening dose on any day you miss supper at home because you ate [place] the ballpark  Take sulindac 200 mg up to 12 hours with meals as needed  for back pain and if not effective > ok to use tramadol but follow up with Dr Jorge Mandril needed  Please remember to go to the x-ray department downstairs for your tests - we will call you with the results when they are available.     Please schedule a follow up visit in 3 months but call sooner if needed

## 2011-06-11 ENCOUNTER — Telehealth: Payer: Self-pay | Admitting: Internal Medicine

## 2011-06-11 ENCOUNTER — Other Ambulatory Visit: Payer: Self-pay | Admitting: Internal Medicine

## 2011-06-11 NOTE — Progress Notes (Signed)
Quick Note:  Spoke with pt and notified of results per Dr. Wert. Pt verbalized understanding and denied any questions.  ______ 

## 2011-06-11 NOTE — Telephone Encounter (Signed)
Spoke with pt and notified of results per Dr. Wert. Pt verbalized understanding and denied any questions. 

## 2011-06-17 ENCOUNTER — Telehealth: Payer: Self-pay | Admitting: Internal Medicine

## 2011-06-17 NOTE — Telephone Encounter (Signed)
I spoke with [name] and she states pt has been coughing and having some congestion x 1 week. Wanted pt to be seen today/tomorrow. Since [facility] had an opening at 9:45 tomorrow pt is coming in at that time

## 2011-06-18 ENCOUNTER — Other Ambulatory Visit (HOSPITAL_BASED_OUTPATIENT_CLINIC_OR_DEPARTMENT_OTHER): Payer: Medicare Other | Admitting: Lab

## 2011-06-18 ENCOUNTER — Ambulatory Visit (HOSPITAL_BASED_OUTPATIENT_CLINIC_OR_DEPARTMENT_OTHER): Payer: Medicare Other

## 2011-06-18 ENCOUNTER — Ambulatory Visit (INDEPENDENT_AMBULATORY_CARE_PROVIDER_SITE_OTHER): Payer: Medicare Other | Admitting: Pulmonary Disease

## 2011-06-18 ENCOUNTER — Encounter: Payer: Self-pay | Admitting: Pulmonary Disease

## 2011-06-18 VITALS — BP 121/76 | HR 84 | Temp 98.0°F

## 2011-06-18 VITALS — BP 118/66 | HR 80 | Temp 98.1°F | Ht 71.0 in | Wt 251.6 lb

## 2011-06-18 DIAGNOSIS — R059 Cough, unspecified: Secondary | ICD-10-CM

## 2011-06-18 DIAGNOSIS — E785 Hyperlipidemia, unspecified: Secondary | ICD-10-CM

## 2011-06-18 DIAGNOSIS — D649 Anemia, unspecified: Secondary | ICD-10-CM

## 2011-06-18 DIAGNOSIS — E669 Obesity, unspecified: Secondary | ICD-10-CM

## 2011-06-18 DIAGNOSIS — IMO0002 Reserved for concepts with insufficient information to code with codable children: Secondary | ICD-10-CM

## 2011-06-18 DIAGNOSIS — R05 Cough: Secondary | ICD-10-CM

## 2011-06-18 DIAGNOSIS — I872 Venous insufficiency (chronic) (peripheral): Secondary | ICD-10-CM

## 2011-06-18 DIAGNOSIS — M2550 Pain in unspecified joint: Secondary | ICD-10-CM

## 2011-06-18 DIAGNOSIS — I10 Essential (primary) hypertension: Secondary | ICD-10-CM

## 2011-06-18 DIAGNOSIS — C8595 Non-Hodgkin lymphoma, unspecified, lymph nodes of inguinal region and lower limb: Secondary | ICD-10-CM

## 2011-06-18 DIAGNOSIS — K469 Unspecified abdominal hernia without obstruction or gangrene: Secondary | ICD-10-CM

## 2011-06-18 DIAGNOSIS — R609 Edema, unspecified: Secondary | ICD-10-CM

## 2011-06-18 DIAGNOSIS — R21 Rash and other nonspecific skin eruption: Secondary | ICD-10-CM

## 2011-06-18 DIAGNOSIS — J310 Chronic rhinitis: Secondary | ICD-10-CM

## 2011-06-18 DIAGNOSIS — N139 Obstructive and reflux uropathy, unspecified: Secondary | ICD-10-CM

## 2011-06-18 DIAGNOSIS — D72819 Decreased white blood cell count, unspecified: Secondary | ICD-10-CM

## 2011-06-18 DIAGNOSIS — E1365 Other specified diabetes mellitus with hyperglycemia: Secondary | ICD-10-CM

## 2011-06-18 DIAGNOSIS — K219 Gastro-esophageal reflux disease without esophagitis: Secondary | ICD-10-CM

## 2011-06-18 DIAGNOSIS — E559 Vitamin D deficiency, unspecified: Secondary | ICD-10-CM

## 2011-06-18 LAB — CBC WITH DIFFERENTIAL/PLATELET
BASO%: 0.6 % (ref 0.0–2.0)
EOS%: 3 % (ref 0.0–7.0)
HCT: 26.5 % — ABNORMAL LOW (ref 38.4–49.9)
LYMPH%: 44.3 % (ref 14.0–49.0)
MCH: 37.4 pg — ABNORMAL HIGH (ref 27.2–33.4)
MCHC: 34.6 g/dL (ref 32.0–36.0)
MONO#: 0.1 10*3/uL (ref 0.1–0.9)
MONO%: 3 % (ref 0.0–14.0)
NEUT%: 49.1 % (ref 39.0–75.0)
Platelets: 104 10*3/uL — ABNORMAL LOW (ref 140–400)
RBC: 2.45 10*6/uL — ABNORMAL LOW (ref 4.20–5.82)
WBC: 1.9 10*3/uL — ABNORMAL LOW (ref 4.0–10.3)
nRBC: 0 % (ref 0–0)

## 2011-06-18 MED ORDER — DARBEPOETIN ALFA-POLYSORBATE 300 MCG/0.6ML IJ SOLN
300.0000 ug | Freq: Once | INTRAMUSCULAR | Status: AC
  Administered 2011-06-18: 300 ug via SUBCUTANEOUS
  Filled 2011-06-18: qty 0.6

## 2011-06-18 MED ORDER — PREDNISONE 10 MG PO TABS: ORAL_TABLET | ORAL | Status: DC

## 2011-06-18 NOTE — Patient Instructions (Addendum)
Will treat with a short course of prednisone to see if it will help Get chlorpheniramine 8mg  at bedtime each night for the next one week to see if things improve. Also, use your nasal spray for the next one week as directed. Let us know if you begin to cough up discolored mucus, or if you do not improve.

## 2011-06-18 NOTE — Progress Notes (Signed)
Addended by: Michel Bickers A on: 06/18/2011 10:36 AM   Modules accepted: Orders

## 2011-06-18 NOTE — Assessment & Plan Note (Signed)
The patient has had worsening cough over one week, and it is unclear how much of this is from his upper airway versus lower.  A lot of his history suggests more of an upper airway issue with postnasal drip, however he also is complaining of chest tightness with increased shortness of breath and has a questionable history of asthma.  At this point, we'll treat him with a short course of prednisone, and will also work on treatment of rhinitis.  The patient is to call [name] if he begins to produce purulent mucus.

## 2011-06-18 NOTE — Progress Notes (Signed)
  Subjective:    Patient ID: [name], male    DOB: July 28, 1932, 78 y.o.   MRN: [id number]  HPI The patient comes in today for an acute sick visit.  He gives a one-week history of increasing cough, and feeling of chest tightness and congestion, as well as a feeling of fullness in his throat with hoarseness.  He also has some increased shortness of breath.  He is not bringing up any purulent mucus from his chest or nose, and has had no fever.  He does use albuterol as needed, and has increased his use most recently.   Review of Systems  Constitutional: Negative.  Negative for fever and unexpected weight change.  HENT: Positive for congestion and postnasal drip. Negative for ear pain, nosebleeds, sore throat, rhinorrhea, sneezing, trouble swallowing, dental problem and sinus pressure.   Eyes: Negative.  Negative for redness and itching.  Respiratory: Positive for cough, chest tightness and shortness of breath. Negative for wheezing.   Cardiovascular: Negative.  Negative for palpitations and leg swelling.  Gastrointestinal: Negative.  Negative for nausea and vomiting.  Genitourinary: Negative.  Negative for dysuria.  Musculoskeletal: Negative.  Negative for joint swelling.  Skin: Negative.  Negative for rash.  Neurological: Positive for headaches.  Hematological: Negative.  Does not bruise/bleed easily.  Psychiatric/Behavioral: Negative.  Negative for dysphoric mood. The patient is not nervous/anxious.        Objective:   Physical Exam Obese male in no acute distress Nose without purulence or discharge noted Oropharynx clear Chest totally clear to auscultation with no wheezes or rhonchi Cardiac exam with regular rate and rhythm, 2/6 systolic murmur Lower extremities with 1+ edema, varicosities, no cyanosis Alert and oriented, moves all 4 extremities.       Assessment & Plan:

## 2011-06-23 ENCOUNTER — Telehealth: Payer: Self-pay | Admitting: Internal Medicine

## 2011-06-23 ENCOUNTER — Other Ambulatory Visit: Payer: Self-pay | Admitting: Adult Health

## 2011-06-23 NOTE — Telephone Encounter (Signed)
Per 5.23.13 ov note w/ KC:   Patient Instructions     Will treat with a short course of prednisone to see if it will help  Get chlorpheniramine 8mg  at bedtime each night for the next one week to see if things improve.  Also, use your nasal spray for the next one week as directed.  Let us know if you begin to cough up discolored mucus, or if you do not improve.     ATC pt, but automated message stated "I'm sorry but this person has a voice mail that is not set up yet.  Good bye."  WCB.

## 2011-06-24 MED ORDER — AMOXICILLIN-POT CLAVULANATE 875-125 MG PO TABS: 1.0000 | ORAL_TABLET | Freq: Two times a day (BID) | ORAL | Status: AC

## 2011-06-24 NOTE — Telephone Encounter (Signed)
I spoke with daughter and is aware of MW recs. Rx has been sent to the pharmacy and nothing further was needed

## 2011-06-24 NOTE — Telephone Encounter (Signed)
Pt saw KC on 06-18-11 for acute visit. I spoke with the pt daughter and she states the pt is taking chlorpheniramine 8mg  at bedtime, he has been using his nasal spray daily, and is on his last day of 6 day pred taper. She states the pt does not feel any improvement in his cough and he is now coughing up yellow mucus. Pt is requesting an abx be called in. Please advise. Carron Curie, CMA  No Known Allergies

## 2011-06-24 NOTE — Telephone Encounter (Signed)
augmentin 875 bid x 10 days then ov if not better, see back in office asap if worse in meantime

## 2011-07-03 ENCOUNTER — Encounter: Payer: Self-pay | Admitting: Adult Health

## 2011-07-03 ENCOUNTER — Other Ambulatory Visit (INDEPENDENT_AMBULATORY_CARE_PROVIDER_SITE_OTHER): Payer: Medicare Other

## 2011-07-03 ENCOUNTER — Telehealth: Payer: Self-pay | Admitting: Internal Medicine

## 2011-07-03 ENCOUNTER — Ambulatory Visit (INDEPENDENT_AMBULATORY_CARE_PROVIDER_SITE_OTHER): Payer: Medicare Other | Admitting: Adult Health

## 2011-07-03 ENCOUNTER — Ambulatory Visit (INDEPENDENT_AMBULATORY_CARE_PROVIDER_SITE_OTHER)
Admission: RE | Admit: 2011-07-03 | Discharge: 2011-07-03 | Disposition: A | Discharge status: Home / Self Care | Payer: Medicare Other | Source: Ambulatory Visit | Attending: Adult Health | Admitting: Adult Health

## 2011-07-03 VITALS — BP 128/76 | HR 85 | Temp 99.0°F | Ht 71.0 in | Wt 246.2 lb

## 2011-07-03 DIAGNOSIS — J40 Bronchitis, not specified as acute or chronic: Secondary | ICD-10-CM

## 2011-07-03 DIAGNOSIS — R0602 Shortness of breath: Secondary | ICD-10-CM

## 2011-07-03 DIAGNOSIS — J45909 Unspecified asthma, uncomplicated: Secondary | ICD-10-CM

## 2011-07-03 LAB — BRAIN NATRIURETIC PEPTIDE: Pro B Natriuretic peptide (BNP): 193 pg/mL — ABNORMAL HIGH (ref 0.0–100.0)

## 2011-07-03 LAB — BASIC METABOLIC PANEL
GFR: 101.12 mL/min (ref >60.00)
Potassium: 4 mEq/L (ref 3.5–5.1)
Sodium: 135 mEq/L (ref 135–145)

## 2011-07-03 NOTE — Patient Instructions (Signed)
Finish Augmentin  I will call with xray results.  Take extra lasix 20mg  daily for next 3 days then as needed.  Please contact office for sooner follow up if symptoms do not improve or worsen or seek emergency care  follow up Dr. [name]  4 weeks and As needed

## 2011-07-03 NOTE — Assessment & Plan Note (Signed)
Slow to resolve Asthmatic Bronchitis flare ? Volume overload component  Will check and labs with bnp   Plan Finish Augmentin  I will call with xray results.  Take extra lasix 20mg  daily for next 3 days then as needed.  Please contact office for sooner follow up if symptoms do not improve or worsen or seek emergency care  follow up Dr. [name]  4 weeks and As needed

## 2011-07-03 NOTE — Progress Notes (Signed)
Subjective:     Patient ID: [name], male   DOB: Mar 06, 1932, 78 y.o.   MRN: [id number]  HPI 53 yowm quit smoking 1970 with morbid obesity with boderline DM, Lymphoma s/p chemo ([facility]) and tendency to peripheral edema that is felt to be dependent.   April 24, 2009--Returns for post hospital follow up. Admitted 04/11/2009- 04/15/2009 for CAP and leukopenia. CX were neg. CXR showed LLL infiltrate. He was tx w/ IV abx and aggressive pulmonary hygiene. He is doing much better w/ decreased dyspnea/cough. Has finished antibiotics. Is using flutter valve few times a day. Denies chest pain, orthopnea, hemoptysis, fever, n/v/d, edema, headache. His wbc did trend down during admission wbc 4.3>>1.9. Dr Clelia Croft following.   May 08, 2009 2 wk followup. Pt states that chest congestion is improving as well as his breathing. He states that he still feels very fatigued, still needing proaire up to 4 x daily whereas as previously only 4 x weekly. no purulent sputum. rx prednsione x 6 days> resolved   May 21, 2009 Acute visit c/o rash on left arm and left side onset April 22 Pt states rash is very itchy and sometimes painful. rec lotrisone   August 06, 2009 cc for the past month he is unable to walk at a brisk pace without getting out of breath. He still has dry cough "nothing new". very inactive no longer doing treadmill. no problem at night or sitting still. nasal congestion but not using nasonex as per calendar.   August 21, 2009--ov/ NP med review. pt brought all meds with him today. no new complaints. He is doing well. We reviewed his meds and organized them into med calendar. CXR last visit w/ no acute findings. Labs showed persistent pancytopenia. Has follow up w Dr. Frederich Chick > not due until Jan 2012   October 29, 2009 Followup. Pt c/o cough x 5 days- prod with minimal green sputum. Also he c/o sneezing. He has noticed some wheezing with exertion and also has had slight increase in SOB since cough  started. now needing saba whereas prev did not need it. Rx Doxycycline, steroid taper.    March 26, 2010 ov cc doe occ uses proaire, avg once every other day , no noct need, no purulent sputum.    >>no changes   04/30/10  Acute OV--Complains of chest congestion, dry cough, increased SOB, wheezing, x4days and fever 2 days ago. went to [facility] and ER on 04-28-10, was given neb tx only. CXR w/ no acute process, cbc unchanged. No UTI on urine. Pt says his cough started 4 days ago with barking cough to point of vomitting. Very hard to get up mucus. Started on Mucinex DM without much help. Has had fever-tmax 101.4. Daughter is with him today. He is eating and drinking well. No n/v/d, abdominal pain, urinary symptoms. Feels very worn out. Low energy.  Seen in ER but was discharged home without rx. He is no better today with persistent cough. No increased leg swelling. >>Dx with Bronchitis-tx w/ Avelox and Depo Medrol .   05/07/2010 ov f/u seen in ER on 04/28/10  with CXR neg for acute process. He was treated at our office on 4/4 with Avelox and Depo medrol . Did have persistent symptoms and called in on 4/7 -was called in steroid taper. He is slowly turning corner. He feels better but still weak, low energy. His cough/congestion are better.  Levaquin 500mg  daily for 7 days  Mucinex DM Twice daily  As needed  Cough/congestion  Prednisone taper over next week.  Hold sulindac while on prednisone.  Fluids and rest Tessalon 200mg  Three times a day  As needed   follow up Dr. [name]  In 2 weeks and As needed   06/06/2010 ov/ Wert cough has never really resolved since Jan 2012 some better on prednsione, ever since the winter, worse in am p wakes up > white mucus.  Assoc some nasal congestion and ok cxr on 4/2 x for HH.  On pred taper since 5/8 and levaquin.  Has lost calendar and seems confused when shown our copy, even with help of daughter.  No purulent sputum. No resting symtoms.   Leg swelling better on double dose  furosemide.  rec Nutrition consult for wt loss, dm control Sinus CT PFT's  On return  06/20/10 ov/NP med calendar  We are setting you up for a CT sinus > 1. No significant paranasal sinus disease.  We are referring you to a nutritionist to help with diabetes and weight loss Start metformin 500 mg bid    08/20/2010 cpx/ Wert cc  No new or acute complaints but not fasting.  rec follow calendar Please schedule a follow up office visit in 6 weeks, call sooner if needed FASTING on REturn  10/06/2010 f/u ov/Wert cc fatigue, no worse than usual, no hypersomnolence or symptoms of high or low sugars, sob. rec  Work on wt, follow med cal, no change rx  11/27/2010 f/u ov/Wert cc acute onset x3 d ha, sinus and chest congestion, did not follow contingencies listed on med calendar.  No purulent secretions or sob at this point rec Follow med calendar provided Levquin 750 mg one daily x 5 days Prednisone 10 mg take  4 each am x 2 days,   2 each am x 2 days,  1 each am x2days and stop  01/07/2011 f/u ov/Wert did bring most recent calendar cc doe x more than walking flat and slow and increased need for saba to 6x  Per day but Sleeping ok without nocturnal  or early am exacerbation  of respiratory  c/o's or need for noct saba. Also denies any obvious fluctuation of symptoms with weather or environmental changes or other aggravating or alleviating factors except as outlined above  >>no changes   02/19/2011 Follow up and medication review / NP - Reports following medication regimen closely. Daughter helps to manage medication and uses weekly pill box. Medications reviewed and patient education provided Appears he is taking meds correctly.  Lab results from 01/07/11 visit reviewed. A1C 5.5 . He does not check sugars at home  Unsure if any hypoglycemic events. No polyuria or polydipsia rec Follow med closely and bring to each visit.  Decrease Metformin 500 1/2 Twice daily  W/ food Low sweet /cholestrol  diet   06/04/2011 f/u ov/Wert cc intermittent localized back pain x 10 years comes and goes in past,  came and stayed starting in Jan 2013 = pattern is daily, better sitting and lying down, worse with wt bearing with some rad down R leg but not to foot.  Better p injections/ PT under Dr Hiltz's direction.  No worse with cough. >>sulindac rx   07/03/2011 Acute OV   Complains of  Patient c/o cough and difficulty breathing x 2 weeks  Seen 2 weeks ago , tx w/ steroid taper  Was called in augmentin by on call MD , has couple days left.  Cough is some better but not gone.  Still has dyspnea,  esp At bedtime   Still has some wheezing Some edema but mild.  No hemotpysis , fever or chest pain  No discolored mucus     ROS:  Constitutional:   No  weight loss, night sweats,  Fevers, chills,  +fatigue, or  lassitude.  HEENT:   No headaches,  Difficulty swallowing,  Tooth/dental problems, or  Sore throat,                No sneezing, itching, ear ache, nasal congestion, post nasal drip,   CV:  No chest pain,   anasarca, dizziness, palpitations, syncope.   GI  No heartburn, indigestion, abdominal pain, nausea, vomiting, diarrhea, change in bowel habits, loss of appetite, bloody stools.   Resp:  ,  No coughing up of blood.  No change in color of mucus.  No wheezing.  No chest wall deformity  Skin: no rash or lesions.  GU: no dysuria, change in color of urine, no urgency or frequency.  No flank pain, no hematuria   MS:  No joint pain or swelling.  No decreased range of motion.  No back pain.  Psych:  No change in mood or affect. No depression or anxiety.  No memory loss.                             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 04/2005.......[facility]  - last chemo 10/08  -repeat CT/ABD CT 11/18/07--no reccurence  - Pancytopenia August 06, 2009 > refer back to [facility] > aranesp rx Jan 2012  RHINITIS,  ALLERGIC NOS (ICD-477.9)  HYPERLIPIDEMIA NEC/NOS (ICD-272.4)  EXOGENOUS OBESITY (ICD-278.00)  - ideal body weight less than 186  AODM onset 2012 with freq pred for airways issues -HgA1C 5.5 12/12 >metformin decreased 500mg  1/2 Twice daily   Vitamin D Deficiency- level 29>>44 (4/9//10)  Left Hip pain onset around 6/09............................................................................   Hiltz  - MRI 11/23/2007 c/w L1-2 bulging disc indents the thecal sac with foraminal stenosis  HEALTH MAINTENANCE..........................................................................................Marland KitchenWert  - Td 10/2005  - Pneumovax 10/07 second shot  - CPX 08/20/2010  --colon 02/2005 -int/extern. hems (repeat q7y)  Complex med regimen  --Meds reviewed with pt education and computerized med calendar completed/adjusted. November 08, 2008 , August 21, 2009 updated 02/19/2011  Syncope...........................................................................................................[facility] KitchenHochrein  - Mobitz II AV block s/p Medtronic pacemaker placement 12/11/2008  D[facility]at[facility].....................................................................................................[facility]  - Pruritic rash 04/2009 > tried lotrisone, [facility] to [facility] August 06, 2009    Family History:   mother had diabetes   Social History:  Patient states former smoker.  quit smoking in 1970  No ETOH  Retired            Objective:   Physical Exam wt 244 January 16, 2008 > 249 April 24, 2009 > 249 10/06/2010 >  11/27/2010  245 > 01/07/2011 241 > 02/19/11 238 > 06/04/2011  247 >07/03/2011 246   Ambulatory obese wm in no acute distress  HEENT: nl dentition, turbinates, and orophanx.  Mucus membranes pink and moist. No tongue or throat exudate.  Neck: supple with full ROM. no JVD, node enlargement or TMJ   Lungs: clear and equal bilateral breath sounds. No wheezing, rhonchi or rales.  Chest: RRR. No murmurs, rubs,  or gallops. 1+ peripheral edema.  Abd: soft and non tender. Normal excursion in the supine position.  Ext warm and dry, no calf tenderness, cyanosis or clubbing. mild/mod chronic venous insufficiency changes.  Varicose veins present. Neuro: no focal neuro deficits.         Assessment:          Plan:

## 2011-07-03 NOTE — Telephone Encounter (Signed)
Spoke with [employer]. She states that pt's breathing and his cough has not improved at all since last ov with KC. OV with TP for 3:15 pm today for eval.

## 2011-07-09 ENCOUNTER — Ambulatory Visit (HOSPITAL_BASED_OUTPATIENT_CLINIC_OR_DEPARTMENT_OTHER): Payer: Medicare Other

## 2011-07-09 ENCOUNTER — Other Ambulatory Visit (HOSPITAL_BASED_OUTPATIENT_CLINIC_OR_DEPARTMENT_OTHER): Payer: Medicare Other | Admitting: Lab

## 2011-07-09 VITALS — BP 123/77 | HR 77 | Temp 97.7°F

## 2011-07-09 DIAGNOSIS — C8595 Non-Hodgkin lymphoma, unspecified, lymph nodes of inguinal region and lower limb: Secondary | ICD-10-CM

## 2011-07-09 DIAGNOSIS — I10 Essential (primary) hypertension: Secondary | ICD-10-CM

## 2011-07-09 DIAGNOSIS — R21 Rash and other nonspecific skin eruption: Secondary | ICD-10-CM

## 2011-07-09 DIAGNOSIS — IMO0002 Reserved for concepts with insufficient information to code with codable children: Secondary | ICD-10-CM

## 2011-07-09 DIAGNOSIS — I872 Venous insufficiency (chronic) (peripheral): Secondary | ICD-10-CM

## 2011-07-09 DIAGNOSIS — M2550 Pain in unspecified joint: Secondary | ICD-10-CM

## 2011-07-09 DIAGNOSIS — D649 Anemia, unspecified: Secondary | ICD-10-CM

## 2011-07-09 DIAGNOSIS — K469 Unspecified abdominal hernia without obstruction or gangrene: Secondary | ICD-10-CM

## 2011-07-09 DIAGNOSIS — N139 Obstructive and reflux uropathy, unspecified: Secondary | ICD-10-CM

## 2011-07-09 DIAGNOSIS — R059 Cough, unspecified: Secondary | ICD-10-CM

## 2011-07-09 DIAGNOSIS — E1365 Other specified diabetes mellitus with hyperglycemia: Secondary | ICD-10-CM

## 2011-07-09 DIAGNOSIS — E559 Vitamin D deficiency, unspecified: Secondary | ICD-10-CM

## 2011-07-09 DIAGNOSIS — K219 Gastro-esophageal reflux disease without esophagitis: Secondary | ICD-10-CM

## 2011-07-09 DIAGNOSIS — R05 Cough: Secondary | ICD-10-CM

## 2011-07-09 DIAGNOSIS — E785 Hyperlipidemia, unspecified: Secondary | ICD-10-CM

## 2011-07-09 DIAGNOSIS — D72819 Decreased white blood cell count, unspecified: Secondary | ICD-10-CM

## 2011-07-09 DIAGNOSIS — E669 Obesity, unspecified: Secondary | ICD-10-CM

## 2011-07-09 DIAGNOSIS — J310 Chronic rhinitis: Secondary | ICD-10-CM

## 2011-07-09 DIAGNOSIS — R609 Edema, unspecified: Secondary | ICD-10-CM

## 2011-07-09 LAB — CBC WITH DIFFERENTIAL/PLATELET
Basophils Absolute: 0 10*3/uL (ref 0.0–0.1)
Eosinophils Absolute: 0.1 10*3/uL (ref 0.0–0.5)
HGB: 10.1 g/dL — ABNORMAL LOW (ref 13.0–17.1)
MONO#: 0.1 10*3/uL (ref 0.1–0.9)
NEUT#: 0.8 10*3/uL — ABNORMAL LOW (ref 1.5–6.5)
RDW: 15.9 % — ABNORMAL HIGH (ref 11.0–14.6)
lymph#: 1 10*3/uL (ref 0.9–3.3)

## 2011-07-09 MED ORDER — DARBEPOETIN ALFA-POLYSORBATE 300 MCG/0.6ML IJ SOLN
300.0000 ug | Freq: Once | INTRAMUSCULAR | Status: AC
  Administered 2011-07-09: 300 ug via SUBCUTANEOUS
  Filled 2011-07-09: qty 0.6

## 2011-07-29 ENCOUNTER — Encounter: Payer: Self-pay | Admitting: Oncology

## 2011-07-29 ENCOUNTER — Ambulatory Visit (HOSPITAL_BASED_OUTPATIENT_CLINIC_OR_DEPARTMENT_OTHER): Payer: Medicare Other

## 2011-07-29 ENCOUNTER — Other Ambulatory Visit (HOSPITAL_BASED_OUTPATIENT_CLINIC_OR_DEPARTMENT_OTHER): Payer: Medicare Other | Admitting: Lab

## 2011-07-29 ENCOUNTER — Ambulatory Visit (HOSPITAL_BASED_OUTPATIENT_CLINIC_OR_DEPARTMENT_OTHER): Payer: Medicare Other | Admitting: Oncology

## 2011-07-29 ENCOUNTER — Telehealth: Payer: Self-pay | Admitting: Oncology

## 2011-07-29 VITALS — BP 125/71 | HR 80 | Temp 97.1°F | Ht 71.0 in | Wt 245.9 lb

## 2011-07-29 DIAGNOSIS — D649 Anemia, unspecified: Secondary | ICD-10-CM

## 2011-07-29 DIAGNOSIS — C8595 Non-Hodgkin lymphoma, unspecified, lymph nodes of inguinal region and lower limb: Secondary | ICD-10-CM

## 2011-07-29 DIAGNOSIS — D61818 Other pancytopenia: Secondary | ICD-10-CM

## 2011-07-29 DIAGNOSIS — D472 Monoclonal gammopathy: Secondary | ICD-10-CM

## 2011-07-29 LAB — CBC WITH DIFFERENTIAL/PLATELET
Basophils Absolute: 0 10*3/uL (ref 0.0–0.1)
EOS%: 1.9 % (ref 0.0–7.0)
Eosinophils Absolute: 0 10*3/uL (ref 0.0–0.5)
LYMPH%: 60.4 % — ABNORMAL HIGH (ref 14.0–49.0)
MCH: 37.3 pg — ABNORMAL HIGH (ref 27.2–33.4)
MCV: 108.6 fL — ABNORMAL HIGH (ref 79.3–98.0)
MONO%: 3.4 % (ref 0.0–14.0)
NEUT#: 0.5 10*3/uL — ABNORMAL LOW (ref 1.5–6.5)
Platelets: 114 10*3/uL — ABNORMAL LOW (ref 140–400)
RBC: 2.48 10*6/uL — ABNORMAL LOW (ref 4.20–5.82)

## 2011-07-29 MED ORDER — DARBEPOETIN ALFA-POLYSORBATE 300 MCG/0.6ML IJ SOLN
300.0000 ug | Freq: Once | INTRAMUSCULAR | Status: AC
  Administered 2011-07-29: 300 ug via SUBCUTANEOUS
  Filled 2011-07-29: qty 0.6

## 2011-07-29 NOTE — Addendum Note (Signed)
Addended by: Myrtis Ser on: 07/29/2011 02:34 PM   Modules accepted: Orders

## 2011-07-29 NOTE — Progress Notes (Signed)
Hematology and Oncology Follow Up Visit  [name] [id number] [birth year] 78 y.o. 07/29/2011 2:26 PM  CC: Victor Needle B. [name], MD, FCCP    Principle Diagnosis: A 78 year old gentleman with the following diagnoses:   1. Follicular lymphoma as a grade 3, stage III, diagnosed 2007, continued to be in remission. 2. Pancytopenia associated with mild neutropenia and macrocytosis possibly indicating early myelodysplasia. 3. Monoclonal gammopathy of undetermined significance.  Prior Therapy:  1. Status post CHOP and rituximab.  He had complete response to therapy concluded in 2007.  No further imaging has been indicated at this time. 2. Status post bone marrow biopsy in April 2011, did not really show any evidence of myelodysplasia or metastatic lymphoma at this time.  Current therapy: Aranesp 300 mcg every 3 weeks to keep his hemoglobin close to 11.  Interim History: [name] presents today for a followup visit.  He has continued to really do very well and have no major issues since the last time I saw him.  He had not had any hospitalization, had not had any illnesses.  Had not reported any evidence of bleeding.  Had not had any infection. His activity level is about the same.  His energy is reasonable and had not had any other complications to report.  He does not report any dysphagia.  He does not report any lymphadenopathy. No night sweats. Overall performance status, activity level remains stable and certainly it was helped by Aransep.  Medications: I have reviewed the patient's current medications. Current outpatient prescriptions:acetaminophen (TYLENOL) 500 MG tablet, 1 to 2 tabs every 4 to 6 hours as needed , Disp: , Rfl: ;  aspirin 81 MG tablet, Take 81 mg by mouth daily.  , Disp: , Rfl: ;  calcium-vitamin D (OS-CAL 500 + D) 500-200 MG-UNIT per tablet, Take 1 tablet by mouth 2 (two) times daily.  , Disp: , Rfl: ;  Cholecalciferol (VITAMIN D) 1000 UNITS capsule, Take 1,000 Units by mouth 2  (two) times daily.  , Disp: , Rfl:  darbepoetin (ARANESP, ALB FREE, SURECLICK) 100 MCG/0.5ML SOLN, (unsure of dosage), Disp: , Rfl: ;  dextromethorphan-guaiFENesin (MUCINEX DM) 30-600 MG per 12 hr tablet, Take 1 tablet by mouth every 12 (twelve) hours as needed., Disp: , Rfl: ;  docusate sodium (STOOL SOFTENER) 100 MG capsule, as directed. , Disp: , Rfl: ;  famotidine (PEPCID) 20 MG tablet, Take 20 mg by mouth at bedtime.  , Disp: , Rfl:  finasteride (PROSCAR) 5 MG tablet, Take 5 mg by mouth daily.  , Disp: , Rfl: ;  furosemide (LASIX) 20 MG tablet, Take 1 tablet by mouth daily.  May take 1 extra daily as needed for swelling in legs., Disp: , Rfl: ;  hydrocortisone cream 1 %, Apply topically 2 (two) times daily as needed., Disp: , Rfl: ;  KLOR-CON M20 20 MEQ tablet, TAKE 1 TABLET DAILY, Disp: 34 tablet, Rfl: 11 LEXAPRO 20 MG tablet, TAKE ONE-HALF TABLET BY MOUTH EVERY DAY, Disp: 30 each, Rfl: 6;  metFORMIN (GLUCOPHAGE) 500 MG tablet, TAKE 1 TABLET BY MOUTH TWICE A DAY WITH FOOD, Disp: 60 tablet, Rfl: 5;  methylcellulose (CITRUCEL) oral powder, 1 scoop in 8oz of water once daily, Disp: , Rfl: ;  mometasone (NASONEX) 50 MCG/ACT nasal spray, 0-2 puffs in the morning and 1-2 puffs at bedtime, Disp: , Rfl:  Multiple Vitamin (MULTIVITAMIN) tablet, Take 1 tablet by mouth daily.  , Disp: , Rfl: ;  omeprazole (PRILOSEC) 20 MG capsule, Take 20 mg  by mouth daily with breakfast. , Disp: , Rfl: ;  oxymetazoline (AFRIN) 0.05 % nasal spray, 2 puffs every 12 hours needed for stuffy nose x 5 days and stop, Disp: , Rfl: ;  polyethylene glycol powder (MIRALAX) powder, Take 17 g by mouth 2 (two) times daily. , Disp: , Rfl:  predniSONE (DELTASONE) 10 MG tablet, Take 4 tablets for 2 days, 2 tablets for 2 days, 1 tablet for 2 days, then stop., Disp: 14 tablet, Rfl: 0;  PROAIR HFA 108 (90 BASE) MCG/ACT inhaler, INHALE 1 TO 2 PUFFS BY MOUTH EVERY 4 TO 6 HOURS AS NEEDED, Disp: 1 Inhaler, Rfl: 11;  sulindac (CLINORIL) 200 MG tablet, Take  1 tablet (200 mg total) by mouth 2 (two) times daily., Disp: 60 tablet, Rfl: 1 traMADol (ULTRAM) 50 MG tablet, One every 4 hours if severe cough or pain, Disp: 40 tablet, Rfl: 0;  traZODone (DESYREL) 150 MG tablet, TAKE 1/3 TABLET BY MOUTH AT BEDTIME, Disp: 30 tablet, Rfl: 11 Current facility-administered medications:levalbuterol (XOPENEX) nebulizer solution 0.63 mg, 0.63 mg, Nebulization, Once, Tammy S Parrett, NP Facility-Administered Medications Ordered in Other Visits: darbepoetin (ARANESP) injection 300 mcg, 300 mcg, Subcutaneous, Once, Myrtis Ser, NP, 300 mcg at 07/29/11 1346  Allergies: No Known Allergies  Past Medical History, Surgical history, Social history, and Family History were reviewed and updated.  Review of Systems: Constitutional:  Negative for fever, chills, night sweats, anorexia, weight loss, pain. Cardiovascular: no chest pain or dyspnea on exertion Respiratory: no cough, shortness of breath, or wheezing Neurological: no TIA or stroke symptoms Dermatological: negative ENT: negative Skin: Negative. Gastrointestinal: no abdominal pain, change in bowel habits, or black or bloody stools Genito-Urinary: no dysuria, trouble voiding, or hematuria Hematological and Lymphatic: negative Breast: negative Musculoskeletal: negative Remaining ROS negative.  Physical Exam: Blood pressure 125/71, pulse 80, temperature 97.1 F (36.2 C), temperature source Oral, height 5\' 11"  (1.803 m), weight 245 lb 14.4 oz (111.54 kg). ECOG: 1 General appearance: alert Head: Normocephalic, without obvious abnormality, atraumatic Neck: no adenopathy, no carotid bruit, no JVD, supple, symmetrical, trachea midline and thyroid not enlarged, symmetric, no tenderness/mass/nodules Lymph nodes: Cervical, supraclavicular, and axillary nodes normal. Heart:regular rate and rhythm, S1, S2 normal, no murmur, click, rub or gallop Lung:chest clear, no wheezing, rales, normal symmetric air  entry Abdomen: soft, non-tender, without masses or organomegaly EXT:no erythema, induration, or nodules  Lab Results: Lab Results  Component Value Date   WBC 1.5* 07/29/2011   HGB 9.3* 07/29/2011   HCT 26.9* 07/29/2011   MCV 108.6* 07/29/2011   PLT 114* 07/29/2011     Chemistry      Component Value Date/Time   NA 135 07/03/2011 1542   K 4.0 07/03/2011 1542   CL 99 07/03/2011 1542   CO2 29 07/03/2011 1542   BUN 13 07/03/2011 1542   CREATININE 0.8 07/03/2011 1542      Component Value Date/Time   CALCIUM 9.4 07/03/2011 1542   ALKPHOS 33* 05/07/2011 0959   AST 14 05/07/2011 0959   ALT 14 05/07/2011 0959   BILITOT 0.7 05/07/2011 0959      Impression and Plan:  This is a 78 year old gentleman with the following issues:  1. Follicular lymphoma diagnosed in 2007.  He has no evidence to suggest recurrent disease. 2. Pancytopenia of unclear etiology, probably myelodysplastic syndrome is the most likely etiology at this time.  His vitamin B12 and folate have been in the normal range.  He had a bone marrow biopsy recently in April  of 2011; however, if his counts continue to decrease, which they have been stable recently, then certainly a repeat bone marrow biopsy would be indicated.  I feel for the time being supportive measure, even with a diagnosis of myelodysplasia, would be reasonable for Mr. Wik.   3. Anemia.  He is to continue on Aranesp 300 mcg every three weeks to keep hemoglobin above 11. He will receive Aranesp today. 4. Monoclonal gammopathy.  His M-spike had been less than 1 g/dL, is unchanged.  Pending today. Continue to monitor that every 6 months.   5. Followup will be set up in about 3-4 months.  Clenton Pare 7/3/20132:26 PM

## 2011-07-29 NOTE — Telephone Encounter (Signed)
appts made and printed for pt aom °

## 2011-07-31 ENCOUNTER — Encounter: Payer: Self-pay | Admitting: Internal Medicine

## 2011-07-31 ENCOUNTER — Ambulatory Visit (INDEPENDENT_AMBULATORY_CARE_PROVIDER_SITE_OTHER): Payer: Medicare Other | Admitting: Internal Medicine

## 2011-07-31 VITALS — BP 122/70 | HR 94 | Temp 97.5°F | Ht 71.0 in | Wt 248.8 lb

## 2011-07-31 DIAGNOSIS — R05 Cough: Secondary | ICD-10-CM

## 2011-07-31 DIAGNOSIS — R059 Cough, unspecified: Secondary | ICD-10-CM

## 2011-07-31 MED ORDER — PREDNISONE (PAK) 10 MG PO TABS: ORAL_TABLET | ORAL | Status: AC

## 2011-07-31 NOTE — Patient Instructions (Addendum)
Whenever cough is flaring  1) take mucinex dm 1-2 every 12 hours 2) Take extra omeprazole 20mg   30 min before breakfast  GERD (REFLUX)  is an extremely common cause of respiratory symptoms, many times with no significant heartburn at all.    It can be treated with medication, but also with lifestyle changes including avoidance of late meals, excessive alcohol, smoking cessation, and avoid fatty foods, chocolate, peppermint, colas, red wine, and acidic juices such as orange juice.  NO MINT OR MENTHOL PRODUCTS SO NO COUGH DROPS  USE SUGARLESS CANDY INSTEAD (jolley ranchers or Stover's)  NO OIL BASED VITAMINS - use powdered substitutes.   Prednisone 10 mg take  4 each am x 2 days,   2 each am x 2 days,  1 each am x2days and stop   Please schedule a follow up office visit in 6 weeks, call sooner if needed

## 2011-07-31 NOTE — Progress Notes (Signed)
Subjective:     Patient ID: [name], male   DOB: 12/05/1932    MRN: [id number]  HPI 53 yowm quit smoking 1970 with morbid obesity with boderline DM, Lymphoma s/p chemo ([facility]) and tendency to peripheral edema that is felt to be dependent.     01/07/2011 f/u ov/Wert did bring most recent calendar cc doe x more than walking flat and slow and increased need for saba to 6x  Per day but Sleeping ok without nocturnal  or early am exacerbation  of respiratory  c/o's or need for noct saba. Also denies any obvious fluctuation of symptoms with weather or environmental changes or other aggravating or alleviating factors except as outlined above  >>no changes   02/19/2011 Follow up and medication review / NP - Reports following medication regimen closely. Daughter helps to manage medication and uses weekly pill box. Medications reviewed and patient education provided Appears he is taking meds correctly.  Lab results from 01/07/11 visit reviewed. A1C 5.5 . He does not check sugars at home  Unsure if any hypoglycemic events. No polyuria or polydipsia rec Follow med closely and bring to each visit.  Decrease Metformin 500 1/2 Twice daily  W/ food Low sweet /cholestrol diet   06/04/2011 f/u ov/Wert cc intermittent localized back pain x 10 years comes and goes in past,  came and stayed starting in Jan 2013 = pattern is daily, better sitting and lying down, worse with wt bearing with some rad down R leg but not to foot.  Better p injections/ PT under Dr Hiltz's direction.  No worse with cough. >>sulindac rx   07/03/2011 Acute OV  Complains of  Patient c/o cough and difficulty breathing x 2 weeks  Seen 2 weeks ago , tx w/ steroid taper  Was called in augmentin by on call MD , has couple days left.  Cough is some better but not gone.  Still has dyspnea, esp At bedtime   Still has some wheezing Some edema but mild.  No hemotpysis , fever or chest pain  No discolored mucus  rec Finish Augmentin   Take extra lasix 20mg  daily for next 3 days then as needed.    07/31/2011 f/u ov/Wert cc persistent  Daily cough non-productive esp worse in am p bfast and before bedtime despite taking omeprazole 20 mg each am and ppi hs,  Feels has lots of mucus but not able to cough it up.  No obvious daytime variabilty or assoc   cp or chest tightness, subjective wheeze overt sinus or hb symptoms. No unusual exp hx. Some better p rx as above but persistent/ plateau of symptoms.  Once Sleeping does ok without nocturnal   exacerbation  of respiratory  c/o's or need for noct saba. Also denies any obvious fluctuation of symptoms with weather or environmental changes or other aggravating or alleviating factors except as outlined above.  ROS  The following are not active complaints unless bolded sore throat, dysphagia, dental problems, itching, sneezing,  nasal congestion or excess/ purulent secretions, ear ache,   fever, chills, sweats, unintended wt loss, pleuritic or exertional cp, hemoptysis,  orthopnea pnd or leg swelling, presyncope, palpitations, heartburn, abdominal pain, anorexia, nausea, vomiting, diarrhea  or change in bowel or urinary habits, change in stools or urine, dysuria,hematuria,  rash, arthralgias, visual complaints, headache, numbness weakness or ataxia or problems with walking or coordination,  change in mood/affect or memory.         Past Medical History:   LYMPHOMA  NEC, MLIG, INGUINAL/LOWER LIMB (ICD-202.85) dx [facility]....[facility]  - last chemo 10/08  -repeat CT/ABD CT 11/18/07--no reccurence  - Pancytopenia August 06, 2009 > refer back to [facility] > aranesp rx Jan 2012  RHINITIS, ALLERGIC NOS (ICD-477.9)  HYPERLIPIDEMIA NEC/NOS (ICD-272.4)  EXOGENOUS OBESITY (ICD-278.00)  - ideal body weight less than 186  AODM onset 2012 with freq pred for airways issues -HgA1C 5.5 12/12 >metformin decreased 500mg  1/2 Twice daily   Vitamin D Deficiency- level 29>>44 (4/9//10)  Left Hip pain  onset around 6/09............................................................................   Hiltz  - MRI 11/23/2007 c/w L1-2 bulging disc indents the thecal sac with foraminal stenosis  HEALTH MAINTENANCE..........................................................................................Marland KitchenWert  - Td 10/2005  - Pneumovax 10/07 second shot  - CPX 08/20/2010  --colon 02/2005 -int/extern. hems (repeat q7y)  Complex med regimen  --Meds reviewed with pt education and computerized med calendar completed/adjusted. November 08, 2008 , August 21, 2009 updated 02/19/2011  Syncope...........................................................................................................Marland KitchenHochrein  - Mobitz II AV block s/p Medtronic pacemaker placement 12/11/2008  [facility]at[facility].....................................................................................................[facility]  - Pruritic rash 04/2009 > tried lotrisone, referred to [facility] August 06, 2009    Family History:   mother had diabetes   Social History:  Patient states former smoker.  quit smoking in 1970  No ETOH  Retired       Objective:   Physical Exam wt 244 January 16, 2008 > 249 April 24, 2009 > 249 10/06/2010 >  11/27/2010  245 > 01/07/2011 241 > 02/19/11 238 > 06/04/2011  247 >07/03/2011 246 > 07/31/2011  248  Ambulatory obese wm in no acute distress  HEENT: nl dentition, turbinates, and orophanx.  Mucus membranes pink and moist. No tongue or throat exudate.  Neck: supple with full ROM. no JVD, node enlargement or TMJ   Lungs: clear and equal bilateral breath sounds. No wheezing, rhonchi or rales.  Chest: RRR. No murmurs, rubs, or gallops.  trace peripheral edema.  Abd: soft and non tender. Normal excursion in the supine position.  Ext warm and dry, no calf tenderness, cyanosis or clubbing. mild/mod chronic venous insufficiency changes. Varicose veins present. Neuro: nl sensorium and gait..       cxr 07/03/11  1.  Cardiac enlargement.  2. No acute findings.   Assessment:          Plan:

## 2011-08-01 NOTE — Assessment & Plan Note (Signed)
The most common causes of chronic cough in immunocompetent adults include the following: upper airway cough syndrome (UACS), previously referred to as postnasal drip syndrome (PNDS), which is caused by variety of rhinosinus conditions; (2) asthma; (3) GERD; (4) chronic bronchitis from cigarette smoking or other inhaled environmental irritants; (5) nonasthmatic eosinophilic bronchitis; and (6) bronchiectasis.   These conditions, singly or in combination, have accounted for up to 94% of the causes of chronic cough in prospective studies.   Other conditions have constituted no >6% of the causes in prospective studies These have included bronchogenic carcinoma, chronic interstitial pneumonia, sarcoidosis, left ventricular failure, ACEI-induced cough, and aspiration from a condition associated with pharyngeal dysfunction.   .Chronic cough is often simultaneously caused by more than one condition. A single cause has been found from 38 to 82% of the time, multiple causes from 18 to 62%. Multiply caused cough has been the result of three diseases up to 42% of the time.    For now strongest likelihood remains  Classic Upper airway cough syndrome, so named because it's frequently impossible to sort out how much is  CR/sinusitis with freq throat clearing (which can be related to primary GERD)   vs  causing  secondary (" extra esophageal")  GERD from wide swings in gastric pressure that occur with throat clearing, often  promoting self use of mint and menthol lozenges that reduce the lower esophageal sphincter tone and exacerbate the problem further in a cyclical fashion.   These are the same pts (now being labeled as having "irritable larynx syndrome" by some cough centers) who not infrequently have a history of having failed to tolerate ace inhibitors,  dry powder inhalers or biphosphonates or report having atypical reflux symptoms that don't respond to standard doses of PPI , and are easily confused as having  aecopd or asthma flares by even experienced allergists/ pulmonologists.   If not better next step is consider empiric trial for asthma

## 2011-08-03 LAB — COMPREHENSIVE METABOLIC PANEL
AST: 17 U/L (ref 0–37)
Alkaline Phosphatase: 33 U/L — ABNORMAL LOW (ref 39–117)
BUN: 14 mg/dL (ref 6–23)
Glucose, Bld: 135 mg/dL — ABNORMAL HIGH (ref 70–99)
Sodium: 139 mEq/L (ref 135–145)
Total Bilirubin: 0.5 mg/dL (ref 0.3–1.2)

## 2011-08-03 LAB — SPEP & IFE WITH QIG
Albumin ELP: 62.2 % (ref 55.8–66.1)
IgA: 48 mg/dL — ABNORMAL LOW (ref 68–379)
IgM, Serum: 20 mg/dL — ABNORMAL LOW (ref 41–251)
Total Protein, Serum Electrophoresis: 6.3 g/dL (ref 6.0–8.3)

## 2011-08-03 LAB — KAPPA/LAMBDA LIGHT CHAINS
Kappa free light chain: 1.12 mg/dL (ref 0.33–1.94)
Kappa:Lambda Ratio: 0.21 — ABNORMAL LOW (ref 0.26–1.65)
Lambda Free Lght Chn: 5.28 mg/dL — ABNORMAL HIGH (ref 0.57–2.63)

## 2011-08-18 ENCOUNTER — Other Ambulatory Visit: Payer: Self-pay | Admitting: Internal Medicine

## 2011-08-19 ENCOUNTER — Ambulatory Visit (HOSPITAL_BASED_OUTPATIENT_CLINIC_OR_DEPARTMENT_OTHER): Payer: Medicare Other

## 2011-08-19 ENCOUNTER — Other Ambulatory Visit (HOSPITAL_BASED_OUTPATIENT_CLINIC_OR_DEPARTMENT_OTHER): Payer: Medicare Other | Admitting: Lab

## 2011-08-19 VITALS — BP 120/71 | HR 66 | Temp 97.5°F

## 2011-08-19 DIAGNOSIS — E669 Obesity, unspecified: Secondary | ICD-10-CM

## 2011-08-19 DIAGNOSIS — M2550 Pain in unspecified joint: Secondary | ICD-10-CM

## 2011-08-19 DIAGNOSIS — E559 Vitamin D deficiency, unspecified: Secondary | ICD-10-CM

## 2011-08-19 DIAGNOSIS — D72819 Decreased white blood cell count, unspecified: Secondary | ICD-10-CM

## 2011-08-19 DIAGNOSIS — E1365 Other specified diabetes mellitus with hyperglycemia: Secondary | ICD-10-CM

## 2011-08-19 DIAGNOSIS — D649 Anemia, unspecified: Secondary | ICD-10-CM

## 2011-08-19 DIAGNOSIS — R21 Rash and other nonspecific skin eruption: Secondary | ICD-10-CM

## 2011-08-19 DIAGNOSIS — J310 Chronic rhinitis: Secondary | ICD-10-CM

## 2011-08-19 DIAGNOSIS — K469 Unspecified abdominal hernia without obstruction or gangrene: Secondary | ICD-10-CM

## 2011-08-19 DIAGNOSIS — E785 Hyperlipidemia, unspecified: Secondary | ICD-10-CM

## 2011-08-19 DIAGNOSIS — R609 Edema, unspecified: Secondary | ICD-10-CM

## 2011-08-19 DIAGNOSIS — I10 Essential (primary) hypertension: Secondary | ICD-10-CM

## 2011-08-19 DIAGNOSIS — N139 Obstructive and reflux uropathy, unspecified: Secondary | ICD-10-CM

## 2011-08-19 DIAGNOSIS — K219 Gastro-esophageal reflux disease without esophagitis: Secondary | ICD-10-CM

## 2011-08-19 DIAGNOSIS — R05 Cough: Secondary | ICD-10-CM

## 2011-08-19 DIAGNOSIS — I872 Venous insufficiency (chronic) (peripheral): Secondary | ICD-10-CM

## 2011-08-19 DIAGNOSIS — C8595 Non-Hodgkin lymphoma, unspecified, lymph nodes of inguinal region and lower limb: Secondary | ICD-10-CM

## 2011-08-19 LAB — CBC WITH DIFFERENTIAL/PLATELET
BASO%: 0.4 % (ref 0.0–2.0)
Basophils Absolute: 0 10*3/uL (ref 0.0–0.1)
HCT: 29 % — ABNORMAL LOW (ref 38.4–49.9)
HGB: 9.9 g/dL — ABNORMAL LOW (ref 13.0–17.1)
MCHC: 34.2 g/dL (ref 32.0–36.0)
MONO#: 0.1 10*3/uL (ref 0.1–0.9)
NEUT#: 0.6 10*3/uL — ABNORMAL LOW (ref 1.5–6.5)
NEUT%: 31 % — ABNORMAL LOW (ref 39.0–75.0)
WBC: 2 10*3/uL — ABNORMAL LOW (ref 4.0–10.3)
lymph#: 1.2 10*3/uL (ref 0.9–3.3)

## 2011-08-19 MED ORDER — DARBEPOETIN ALFA-POLYSORBATE 300 MCG/0.6ML IJ SOLN
300.0000 ug | Freq: Once | INTRAMUSCULAR | Status: AC
  Administered 2011-08-19: 300 ug via SUBCUTANEOUS

## 2011-09-09 ENCOUNTER — Other Ambulatory Visit (HOSPITAL_BASED_OUTPATIENT_CLINIC_OR_DEPARTMENT_OTHER): Payer: Medicare Other

## 2011-09-09 ENCOUNTER — Ambulatory Visit (HOSPITAL_BASED_OUTPATIENT_CLINIC_OR_DEPARTMENT_OTHER): Payer: Medicare Other

## 2011-09-09 VITALS — BP 118/75 | HR 56 | Temp 98.5°F

## 2011-09-09 DIAGNOSIS — D649 Anemia, unspecified: Secondary | ICD-10-CM

## 2011-09-09 DIAGNOSIS — C8595 Non-Hodgkin lymphoma, unspecified, lymph nodes of inguinal region and lower limb: Secondary | ICD-10-CM

## 2011-09-09 LAB — CBC WITH DIFFERENTIAL/PLATELET
Basophils Absolute: 0 10*3/uL (ref 0.0–0.1)
EOS%: 2.4 % (ref 0.0–7.0)
HCT: 27.9 % — ABNORMAL LOW (ref 38.4–49.9)
HGB: 9.7 g/dL — ABNORMAL LOW (ref 13.0–17.1)
MCH: 37.6 pg — ABNORMAL HIGH (ref 27.2–33.4)
MONO#: 0.1 10*3/uL (ref 0.1–0.9)
NEUT%: 37.5 % — ABNORMAL LOW (ref 39.0–75.0)
lymph#: 1 10*3/uL (ref 0.9–3.3)

## 2011-09-09 MED ORDER — DARBEPOETIN ALFA-POLYSORBATE 300 MCG/0.6ML IJ SOLN
300.0000 ug | Freq: Once | INTRAMUSCULAR | Status: AC
  Administered 2011-09-09: 300 ug via SUBCUTANEOUS
  Filled 2011-09-09: qty 0.6

## 2011-09-11 ENCOUNTER — Ambulatory Visit (INDEPENDENT_AMBULATORY_CARE_PROVIDER_SITE_OTHER): Payer: Medicare Other | Admitting: Internal Medicine

## 2011-09-11 ENCOUNTER — Encounter: Payer: Self-pay | Admitting: Internal Medicine

## 2011-09-11 VITALS — BP 122/72 | HR 100 | Temp 97.9°F | Ht 71.0 in | Wt 247.4 lb

## 2011-09-11 DIAGNOSIS — R05 Cough: Secondary | ICD-10-CM

## 2011-09-11 DIAGNOSIS — E1365 Other specified diabetes mellitus with hyperglycemia: Secondary | ICD-10-CM

## 2011-09-11 DIAGNOSIS — D649 Anemia, unspecified: Secondary | ICD-10-CM

## 2011-09-11 DIAGNOSIS — R42 Dizziness and giddiness: Secondary | ICD-10-CM

## 2011-09-11 NOTE — Progress Notes (Signed)
Subjective:     Patient ID: [name], male   DOB: [birth year]    MRN: [id number]  HPI 70 yowm quit smoking 1970 with morbid obesity with boderline DM, Lymphoma s/p chemo ([facility]) and tendency to peripheral edema that is felt to be dependent.     01/07/2011 f/u ov/Ordean Fouts did bring most recent calendar cc doe x more than walking flat and slow and increased need for saba to 6x  Per day but Sleeping ok without nocturnal  or early am exacerbation  of respiratory  c/o's or need for noct saba. Also denies any obvious fluctuation of symptoms with weather or environmental changes or other aggravating or alleviating factors except as outlined above  >>no changes   02/19/2011 Follow up and medication review / NP - Reports following medication regimen closely. Daughter helps to manage medication and uses weekly pill box. Medications reviewed and patient education provided Appears he is taking meds correctly.  Lab results from 01/07/11 visit reviewed. A1C 5.5 . He does not check sugars at home  Unsure if any hypoglycemic events. No polyuria or polydipsia rec Follow med closely and bring to each visit.  Decrease Metformin 500 1/2 Twice daily  W/ food Low sweet /cholestrol diet   06/04/2011 f/u ov/Surabhi Gadea cc intermittent localized back pain x 10 years comes and goes in past,  came and stayed starting in Jan 2013 = pattern is daily, better sitting and lying down, worse with wt bearing with some rad down R leg but not to foot.  Better p injections/ PT under Dr Hiltz's direction.  No worse with cough. >>sulindac rx   07/03/2011 Acute OV  Complains of  Patient c/o cough and difficulty breathing x 2 weeks  Seen 2 weeks ago , tx w/ steroid taper  Was called in augmentin by on call MD , has couple days left.  Cough is some better but not gone.  Still has dyspnea, esp At bedtime   Still has some wheezing Some edema but mild.  No hemotpysis , fever or chest pain  No discolored mucus  rec Finish Augmentin   Take extra lasix 20mg  daily for next 3 days then as needed.    07/31/2011 f/u ov/[name] cc persistent  Daily cough non-productive esp worse in am p bfast and before bedtime despite taking omeprazole 20 mg each am and ppi hs,  Feels has lots of mucus but not able to cough it up.  No obvious daytime variabilty or assoc   cp or chest tightness, subjective wheeze overt sinus or hb symptoms. No unusual exp hx. Some better p rx as above but persistent/ plateau of symptoms. rec Whenever cough is flaring  1) take mucinex dm 1-2 every 12 hours 2) Take extra omeprazole 20mg   30 min before breakfast GERD diet Prednisone 10 mg take  4 each am x 2 days,   2 each am x 2 days,  1 each am x2days and stop   09/11/2011 f/u ov/[name] re multiple chronic medical problmes  cc "dizzy" esp in am x 3 weeks ? Some better p eats but not completely, and if stands up after bfast gets worse again. Also having intermittent nose bleeds on asa 81 mg per day and did not know to hold this for acive bleeding - hgb trending down a bit followed in cancer center. No other blood loss he's aware of.  No true verigo or viz changes, ha nausea or vomiting or ams.  Once Sleeping does ok without nocturnal  exacerbation  of respiratory  c/o's or need for noct saba. Also denies any obvious fluctuation of symptoms with weather or environmental changes or other aggravating or alleviating factors except as outlined above.  ROS  The following are not active complaints unless bolded sore throat, dysphagia, dental problems, itching, sneezing,  nasal congestion or excess/ purulent secretions, ear ache,   fever, chills, sweats, unintended wt loss, pleuritic or exertional cp, hemoptysis,  orthopnea pnd or leg swelling, presyncope, palpitations, heartburn, abdominal pain, anorexia, nausea, vomiting, diarrhea  or change in bowel or urinary habits, change in stools or urine, dysuria,hematuria,  rash, arthralgias, visual complaints, headache, numbness weakness  or ataxia or problems with walking or coordination,  change in mood/affect or memory.         Past Medical History:   LYMPHOMA NEC, MLIG, INGUINAL/LOWER LIMB (ICD-202.85) dx 04/2005.......[facility]  - last chemo 10/08  -repeat CT/ABD CT 11/18/07--no reccurence  - Pancytopenia August 06, 2009 > refer back to [facility] > aranesp rx Jan 2012  RHINITIS, ALLERGIC NOS (ICD-477.9)  HYPERLIPIDEMIA NEC/NOS (ICD-272.4)  EXOGENOUS OBESITY (ICD-278.00)  - ideal body weight less than 186  AODM onset 2012 with freq pred for airways issues -HgA1C 5.5 12/12 >metformin decreased 500mg  1/2 Twice daily   Vitamin D Deficiency- level 29>>44 (4/9//10)  Left Hip pain onset around 6/09............................................................................   Hiltz  - MRI 11/23/2007 c/w L1-2 bulging disc indents the thecal sac with foraminal stenosis  HEALTH MAINTENANCE..........................................................................................Marland KitchenWert  - Td 10/2005  - Pneumovax 10/07 second shot  - CPX 08/20/2010  --colon 02/2005 -int/extern. hems (repeat q7y)  Complex med regimen  --Meds reviewed with pt education and computerized med calendar completed/adjusted. November 08, 2008 , August 21, 2009 updated 02/19/2011  Syncope...........................................................................................................Marland KitchenHochrein  - Mobitz II AV block s/p Medtronic pacemaker placement 12/11/2008  [facility]ermatology.....................................................................................................[facility]  - Pruritic rash 04/2009 > tried lotrisone, referred to [facility] August 06, 2009    Family History:   mother had diabetes   Social History:  Patient states former smoker.  quit smoking in 1970  No ETOH  Retired       Objective:   Physical Exam wt 244 January 16, 2008 > 249 April 24, 2009 > 249 10/06/2010 >  11/27/2010  245 > 01/07/2011 241 > 02/19/11 238 >  06/04/2011  247 >07/03/2011 246 > 07/31/2011  248 > 09/11/2011  247  Ambulatory obese wm in no acute distress  HEENT: nl dentition, turbinates, and orophanx.  Mucus membranes pink and moist. No tongue or throat exudate.  Neck: supple with full ROM. no JVD, node enlargement or TMJ   Lungs: clear and equal bilateral breath sounds. No wheezing, rhonchi or rales.  Chest: RRR. No murmurs, rubs, or gallops.  trace peripheral edema.  Abd: soft and non tender. Normal excursion in the supine position.  Ext warm and dry, no calf tenderness, cyanosis or clubbing. mild/mod chronic venous insufficiency changes. Varicose veins present. Neuro: nl sensorium and gait..       cxr 07/03/11  1. Cardiac enlargement.  2. No acute findings.   Assessment:          Plan:

## 2011-09-11 NOTE — Assessment & Plan Note (Signed)
-   New onset 2012 while on freq doses of pred for airways dz >>Nutritionist referral 06/20/2010  And started on metfomin 07/02/10 -02/19/2011 metformin decreased to 500mg  1/2 Twice daily    Lab Results  Component Value Date   HGBA1C 6.1 06/04/2011   Adequate control on present rx, reviewed these spells are not typical of hypoglycemia but more of an orthostatic pattern.

## 2011-09-11 NOTE — Assessment & Plan Note (Signed)
Better on omeprazole total of 40 mg  30-60 min before first meal of the day   Adequate control on present rx, reviewed

## 2011-09-11 NOTE — Assessment & Plan Note (Signed)
Orthostatic in nature but not present on day of ov.  Probably a combination of anemia and mild vol depletion > change furosemide to take prn flare of leg swelling    Each maintenance medication was reviewed in detail including most importantly the difference between maintenance and as needed and under what circumstances the prns are to be used. This was done in the context of a medication calendar review which provided the patient with a user-friendly unambiguous mechanism for medication administration and reconciliation and provides an action plan for all active problems. It is critical that this be shown to every doctor  for modification during the office visit if necessary so the patient can use it as a working document.

## 2011-09-11 NOTE — Assessment & Plan Note (Addendum)
  Lab 09/09/11 1202  HGB 9.7*    rec Hold asa for active nose bleeding

## 2011-09-11 NOTE — Patient Instructions (Addendum)
Stop taking the daily furosemide and only use as needed if swelling  See calendar for specific medication instructions and bring it back for each and every office visit for every healthcare provider you see.  Without it,  you may not receive the best quality medical care that we feel you deserve.  You will note that the calendar groups together  your maintenance  medications that are timed at particular times of the day.  Think of this as your checklist for what your doctor has instructed you to do until your next evaluation to see what benefit  there is  to staying on a consistent group of medications intended to keep you well.  The other group at the bottom is entirely up to you to use as you see fit  for specific symptoms that may arise between visits that require you to treat them on an as needed basis.  Think of this as your action plan or "what if" list.   Separating the top medications from the bottom group is fundamental to providing you adequate care going forward.    Please schedule a follow up office visit in 6 weeks, call sooner if needed to see Tammy on return for new med calendar

## 2011-09-24 ENCOUNTER — Encounter: Payer: Self-pay | Admitting: Internal Medicine

## 2011-09-24 ENCOUNTER — Ambulatory Visit (INDEPENDENT_AMBULATORY_CARE_PROVIDER_SITE_OTHER): Payer: Medicare Other | Admitting: *Deleted

## 2011-09-24 DIAGNOSIS — I441 Atrioventricular block, second degree: Secondary | ICD-10-CM

## 2011-09-24 DIAGNOSIS — I4891 Unspecified atrial fibrillation: Secondary | ICD-10-CM

## 2011-09-24 LAB — PACEMAKER DEVICE OBSERVATION
ATRIAL PACING PM: 2
BATTERY VOLTAGE: 2.79 V
VENTRICULAR PACING PM: 87

## 2011-09-24 NOTE — Progress Notes (Signed)
Pacer check in clinic  

## 2011-09-30 ENCOUNTER — Ambulatory Visit (HOSPITAL_BASED_OUTPATIENT_CLINIC_OR_DEPARTMENT_OTHER): Payer: Medicare Other

## 2011-09-30 ENCOUNTER — Other Ambulatory Visit (HOSPITAL_BASED_OUTPATIENT_CLINIC_OR_DEPARTMENT_OTHER): Payer: Medicare Other | Admitting: Lab

## 2011-09-30 VITALS — BP 120/68 | HR 77 | Temp 97.3°F

## 2011-09-30 DIAGNOSIS — I872 Venous insufficiency (chronic) (peripheral): Secondary | ICD-10-CM

## 2011-09-30 DIAGNOSIS — N139 Obstructive and reflux uropathy, unspecified: Secondary | ICD-10-CM

## 2011-09-30 DIAGNOSIS — R05 Cough: Secondary | ICD-10-CM

## 2011-09-30 DIAGNOSIS — R21 Rash and other nonspecific skin eruption: Secondary | ICD-10-CM

## 2011-09-30 DIAGNOSIS — K469 Unspecified abdominal hernia without obstruction or gangrene: Secondary | ICD-10-CM

## 2011-09-30 DIAGNOSIS — D649 Anemia, unspecified: Secondary | ICD-10-CM

## 2011-09-30 DIAGNOSIS — R609 Edema, unspecified: Secondary | ICD-10-CM

## 2011-09-30 DIAGNOSIS — E785 Hyperlipidemia, unspecified: Secondary | ICD-10-CM

## 2011-09-30 DIAGNOSIS — I10 Essential (primary) hypertension: Secondary | ICD-10-CM

## 2011-09-30 DIAGNOSIS — C8595 Non-Hodgkin lymphoma, unspecified, lymph nodes of inguinal region and lower limb: Secondary | ICD-10-CM

## 2011-09-30 DIAGNOSIS — M2550 Pain in unspecified joint: Secondary | ICD-10-CM

## 2011-09-30 DIAGNOSIS — E559 Vitamin D deficiency, unspecified: Secondary | ICD-10-CM

## 2011-09-30 DIAGNOSIS — E669 Obesity, unspecified: Secondary | ICD-10-CM

## 2011-09-30 DIAGNOSIS — D72819 Decreased white blood cell count, unspecified: Secondary | ICD-10-CM

## 2011-09-30 DIAGNOSIS — J310 Chronic rhinitis: Secondary | ICD-10-CM

## 2011-09-30 DIAGNOSIS — E1365 Other specified diabetes mellitus with hyperglycemia: Secondary | ICD-10-CM

## 2011-09-30 DIAGNOSIS — K219 Gastro-esophageal reflux disease without esophagitis: Secondary | ICD-10-CM

## 2011-09-30 LAB — CBC WITH DIFFERENTIAL/PLATELET
Basophils Absolute: 0 10*3/uL (ref 0.0–0.1)
HCT: 26.8 % — ABNORMAL LOW (ref 38.4–49.9)
HGB: 9.3 g/dL — ABNORMAL LOW (ref 13.0–17.1)
MONO#: 0.1 10*3/uL (ref 0.1–0.9)
NEUT#: 0.6 10*3/uL — ABNORMAL LOW (ref 1.5–6.5)
NEUT%: 36 % — ABNORMAL LOW (ref 39.0–75.0)
WBC: 1.8 10*3/uL — ABNORMAL LOW (ref 4.0–10.3)
lymph#: 1.1 10*3/uL (ref 0.9–3.3)

## 2011-09-30 MED ORDER — DARBEPOETIN ALFA-POLYSORBATE 300 MCG/0.6ML IJ SOLN
300.0000 ug | Freq: Once | INTRAMUSCULAR | Status: AC
  Administered 2011-09-30: 300 ug via SUBCUTANEOUS
  Filled 2011-09-30: qty 0.6

## 2011-10-21 ENCOUNTER — Other Ambulatory Visit (HOSPITAL_BASED_OUTPATIENT_CLINIC_OR_DEPARTMENT_OTHER): Payer: Medicare Other

## 2011-10-21 ENCOUNTER — Ambulatory Visit (HOSPITAL_BASED_OUTPATIENT_CLINIC_OR_DEPARTMENT_OTHER): Payer: Medicare Other

## 2011-10-21 VITALS — BP 130/81 | HR 72 | Temp 97.7°F

## 2011-10-21 DIAGNOSIS — D649 Anemia, unspecified: Secondary | ICD-10-CM

## 2011-10-21 LAB — CBC WITH DIFFERENTIAL/PLATELET
Basophils Absolute: 0 10*3/uL (ref 0.0–0.1)
HCT: 27.3 % — ABNORMAL LOW (ref 38.4–49.9)
HGB: 9.3 g/dL — ABNORMAL LOW (ref 13.0–17.1)
LYMPH%: 50.5 % — ABNORMAL HIGH (ref 14.0–49.0)
MCH: 37.5 pg — ABNORMAL HIGH (ref 27.2–33.4)
MONO#: 0 10*3/uL — ABNORMAL LOW (ref 0.1–0.9)
NEUT%: 43.5 % (ref 39.0–75.0)
Platelets: 108 10*3/uL — ABNORMAL LOW (ref 140–400)
WBC: 1.6 10*3/uL — ABNORMAL LOW (ref 4.0–10.3)
lymph#: 0.8 10*3/uL — ABNORMAL LOW (ref 0.9–3.3)

## 2011-10-21 LAB — COMPREHENSIVE METABOLIC PANEL (CC13)
BUN: 14 mg/dL (ref 7.0–26.0)
CO2: 29 mEq/L (ref 22–29)
Calcium: 9.8 mg/dL (ref 8.4–10.4)
Chloride: 102 mEq/L (ref 98–107)
Creatinine: 0.8 mg/dL (ref 0.7–1.3)
Glucose: 122 mg/dl — ABNORMAL HIGH (ref 70–99)

## 2011-10-21 MED ORDER — DARBEPOETIN ALFA-POLYSORBATE 300 MCG/0.6ML IJ SOLN
300.0000 ug | Freq: Once | INTRAMUSCULAR | Status: AC
  Administered 2011-10-21: 300 ug via SUBCUTANEOUS
  Filled 2011-10-21: qty 0.6

## 2011-10-23 ENCOUNTER — Encounter: Payer: Self-pay | Admitting: Adult Health

## 2011-10-23 ENCOUNTER — Ambulatory Visit (INDEPENDENT_AMBULATORY_CARE_PROVIDER_SITE_OTHER): Payer: Medicare Other | Admitting: Adult Health

## 2011-10-23 VITALS — BP 126/68 | HR 73 | Temp 97.3°F | Ht 71.0 in | Wt 246.4 lb

## 2011-10-23 DIAGNOSIS — R42 Dizziness and giddiness: Secondary | ICD-10-CM

## 2011-10-23 DIAGNOSIS — I4891 Unspecified atrial fibrillation: Secondary | ICD-10-CM

## 2011-10-23 DIAGNOSIS — Z23 Encounter for immunization: Secondary | ICD-10-CM

## 2011-10-23 NOTE — Patient Instructions (Addendum)
We are referring you to cardiology  Flu shot today  Discuss with Urology regarding dizziness and prostate meds.  Follow med calendar closely and bring to each visit.  Please contact office for sooner follow up if symptoms do not improve or worsen or seek emergency care  follow up Dr. [name]  In 3 months and As needed

## 2011-10-23 NOTE — Assessment & Plan Note (Signed)
Lightheadedness ? orthoostatic changes /side effects of meds Also with hx of atrial fib ? Related to these episdose.  Advised to make sure he has enough fluid intake , limit caffeine and change position slowly  Discuss bph meds with urology as these may be contributing to this.

## 2011-10-23 NOTE — Assessment & Plan Note (Signed)
Refer back to cards as dizziness and intermittent episodes of chest heaviness x 3 months

## 2011-10-23 NOTE — Progress Notes (Signed)
Subjective:     Patient ID: [name], male   DOB: [birth year]    MRN: [id number]  HPI 44 yowm quit smoking 1970 with morbid obesity with boderline DM, Lymphoma s/p chemo ([facility]) and tendency to peripheral edema that is felt to be dependent.   01/07/2011 f/u ov/Wert did bring most recent calendar cc doe x more than walking flat and slow and increased need for saba to 6x  Per day but Sleeping ok without nocturnal  or early am exacerbation  of respiratory  c/o's or need for noct saba. Also denies any obvious fluctuation of symptoms with weather or environmental changes or other aggravating or alleviating factors except as outlined above  >>no changes   02/19/2011 Follow up and medication review / NP - Reports following medication regimen closely. Daughter helps to manage medication and uses weekly pill box. Medications reviewed and patient education provided Appears he is taking meds correctly.  Lab results from 01/07/11 visit reviewed. A1C 5.5 . He does not check sugars at home  Unsure if any hypoglycemic events. No polyuria or polydipsia rec Follow med closely and bring to each visit.  Decrease Metformin 500 1/2 Twice daily  W/ food Low sweet /cholestrol diet   06/04/2011 f/u ov/Wert cc intermittent localized back pain x 10 years comes and goes in past,  came and stayed starting in Jan 2013 = pattern is daily, better sitting and lying down, worse with wt bearing with some rad down R leg but not to foot.  Better p injections/ PT under Dr Hiltz's direction.  No worse with cough. >>sulindac rx   07/03/2011 Acute OV  Complains of  Patient c/o cough and difficulty breathing x 2 weeks  Seen 2 weeks ago , tx w/ steroid taper  Was called in augmentin by on call MD , has couple days left.  Cough is some better but not gone.  Still has dyspnea, esp At bedtime   Still has some wheezing Some edema but mild.  No hemotpysis , fever or chest pain  No discolored mucus  rec Finish Augmentin    Take extra lasix 20mg  daily for next 3 days then as needed.    07/31/2011 f/u ov/Wert cc persistent  Daily cough non-productive esp worse in am p bfast and before bedtime despite taking omeprazole 20 mg each am and ppi hs,  Feels has lots of mucus but not able to cough it up.  No obvious daytime variabilty or assoc   cp or chest tightness, subjective wheeze overt sinus or hb symptoms. No unusual exp hx. Some better p rx as above but persistent/ plateau of symptoms. rec Whenever cough is flaring  1) take mucinex dm 1-2 every 12 hours 2) Take extra omeprazole 20mg   30 min before breakfast GERD diet Prednisone 10 mg take  4 each am x 2 days,   2 each am x 2 days,  1 each am x2days and stop   09/11/2011 f/u ov/Wert re multiple chronic medical problmes  cc "dizzy" esp in am x 3 weeks ? Some better p eats but not completely, and if stands up after bfast gets worse again. Also having intermittent nose bleeds on asa 81 mg per day and did not know to hold this for acive bleeding - hgb trending down a bit followed in cancer center. No other blood loss he's aware of.  No true verigo or viz changes, ha nausea or vomiting or ams. >>change lasix to As needed    10/23/2011  Follow up and Med Review  Returns for follow up and med review . - pt brought all meds with him today.  We reviewed all his meds and organized them w/ updated calendar w/ pt education  Taking meds correctly.   c/o increased positional dizziness, weakness/fatigue mainly in am.  Has been going on for ~2-3 months. Seen last ov w/ lasix changed to As needed   No change in symptoms. Of note he is on 3 bph meds.  Worse first thing in am, changing positions, bending over.  No visual or speech changes  Has occasional chest heaviness intermittently. No exertional chest pain. No jaw pain.  No dyspnea or hemotpysis.  No syncope, palpitations.  No edema.  He does have a pacemaker and has been found to have episodes of atrial fib on previous  interogations.        Past Medical History:   LYMPHOMA NEC, MLIG, INGUINAL/LOWER LIMB (ICD-202.85) dx 04/2005.......[facility]  - last chemo 10/08  -repeat CT/ABD CT 11/18/07--no reccurence  - Pancytopenia August 06, 2009 > refer back to [facility] > aranesp rx Jan 2012  RHINITIS, ALLERGIC NOS (ICD-477.9)  HYPERLIPIDEMIA NEC/NOS (ICD-272.4)  EXOGENOUS OBESITY (ICD-278.00)  - ideal body weight less than 186  AODM onset 2012 with freq pred for airways issues -HgA1C 5.5 12/12 >metformin decreased 500mg  1/2 Twice daily   Vitamin D Deficiency- level 29>>44 (4/9//10)  Left Hip pain onset around 6/09............................................................................   Hiltz  - MRI 11/23/2007 c/w L1-2 bulging disc indents the thecal sac with foraminal stenosis  HEALTH MAINTENANCE..........................................................................................Marland KitchenWert  - Td 10/2005  - Pneumovax 10/07 second shot  - CPX 08/20/2010  --colon 02/2005 -int/extern. hems (repeat q7y)  Complex med regimen  --Meds reviewed with pt education and computerized med calendar completed/adjusted. November 08, 2008 , August 21, 2009 updated 02/19/2011 , 10/23/2011  Syncope...........................................................................................................[facility]och[facility]  - Mobitz II AV block s/p Medtronic pacemaker placement 12/11/2008  [facility].....................................................................................................[facility]  - Pruritic rash 04/2009 > tried lotrisone, referred to [facility] August 06, 2009    Family History:   mother had diabetes   Social History:  Patient states former smoker.  quit smoking in 1970  No ETOH  Retired   ROS:  Constitutional:   No  weight loss, night sweats,  Fevers, chills +, fatigue, or  lassitude.  HEENT:   No headaches,  Difficulty swallowing,  Tooth/dental problems, or  Sore throat,                No  sneezing, itching, ear ache, nasal congestion, post nasal drip,   CV:  No chest pain,  Orthopnea, PND,  , anasarca, dizziness, palpitations, syncope.   GI  No heartburn, indigestion, abdominal pain, nausea, vomiting, diarrhea, change in bowel habits, loss of appetite, bloody stools.   Resp: No shortness of breath with exertion or at rest.  No excess mucus, no productive cough,  No non-productive cough,  No coughing up of blood.  No change in color of mucus.  No wheezing.  No chest wall deformity  Skin: no rash or lesions.  GU: no dysuria, change in color of urine,   MS:  No joint pain or swelling.  No decreased range of motion.   Psych:  No change in mood or affect. No depression or anxiety.  No memory loss.         Objective:   Physical Exam wt 244 January 16, 2008 > 249 April 24, 2009 > 249 10/06/2010 >  11/27/2010  245 > 01/07/2011 241 > 02/19/11  238 > 06/04/2011  247 >07/03/2011 246 > 07/31/2011  248 > 09/11/2011  247 >246 10/23/2011   Ambulatory obese wm in no acute distress  HEENT: nl dentition, turbinates, and orophanx.  Mucus membranes pink and moist. No tongue or throat exudate.  Neck: supple with full ROM. no JVD, node enlargement or TMJ   Lungs: clear and equal bilateral breath sounds. No wheezing, rhonchi or rales.  Chest: RRR. No murmurs, rubs, or gallops.  trace peripheral edema.  Abd: soft and non tender. Normal excursion in the supine position.  Ext warm and dry, no calf tenderness, cyanosis or clubbing. mild/mod chronic venous insufficiency changes. Varicose veins present. Neuro: nl sensorium and gait.. CN 2-12 intact, nml strength, head maneuvrs without nystagmus or reproducible symptoms.       cxr 07/03/11  1. Cardiac enlargement.  2. No acute findings.   Assessment:          Plan:

## 2011-10-26 ENCOUNTER — Ambulatory Visit (INDEPENDENT_AMBULATORY_CARE_PROVIDER_SITE_OTHER): Payer: Medicare Other | Admitting: Internal Medicine

## 2011-10-26 ENCOUNTER — Encounter: Payer: Self-pay | Admitting: Internal Medicine

## 2011-10-26 VITALS — BP 141/71 | HR 78 | Ht 70.0 in | Wt 245.0 lb

## 2011-10-26 DIAGNOSIS — R0789 Other chest pain: Secondary | ICD-10-CM

## 2011-10-26 DIAGNOSIS — R42 Dizziness and giddiness: Secondary | ICD-10-CM

## 2011-10-26 DIAGNOSIS — I441 Atrioventricular block, second degree: Secondary | ICD-10-CM

## 2011-10-26 DIAGNOSIS — J810 Acute pulmonary edema: Secondary | ICD-10-CM

## 2011-10-26 DIAGNOSIS — I872 Venous insufficiency (chronic) (peripheral): Secondary | ICD-10-CM

## 2011-10-26 DIAGNOSIS — I4891 Unspecified atrial fibrillation: Secondary | ICD-10-CM

## 2011-10-26 DIAGNOSIS — R609 Edema, unspecified: Secondary | ICD-10-CM

## 2011-10-26 NOTE — Assessment & Plan Note (Signed)
His chest pain is atypical by history.  I had a long discussion with the patient and his son about options to evaluate this further. I suggested a lexiscan myoview, however his son would like to avoid tests if possible.  We will therefore obtain an echo.  If normal, then we will follow conservatively.  If his EF has changed or there are major wall motion abnormalities then stress testing would be prudent at that time. He will follow-up with Norma Fredrickson in 4-6 weeks for further assessment.

## 2011-10-26 NOTE — Assessment & Plan Note (Signed)
Normal pacemaker function See Pace Art report No changes today  

## 2011-10-26 NOTE — Assessment & Plan Note (Signed)
Well controlled, without any recent prolonged episodes Echo Unfortunately, given his anemia, thrombocytopenia, advanced age and co morbidities, he is a poor coumadin candidate at this time.

## 2011-10-26 NOTE — Progress Notes (Signed)
PCP:  Sandrea Hughs, MD  The patient presents today for electrophysiology followup.  He was recently seen in the Pulmonary clinic with postural dizziness and chest tightness and referred to me today.  He reports that over the past few weeks, he has had chest heaviness.  He describes this as constant and not exertional.  He denies SOB above his baseline.  His other concern today is with postural dizziness.  This has been present intermittently for some time.  He denies any dizziness when sitting.   He denies associated palpitations,  orthopnea, PND, presyncope, syncope, or neurologic sequela.  He has chronic stable edema.  The patient feels that he is tolerating medications without difficulties and is otherwise without complaint today.   Past Medical History  Diagnosis Date  . Dyspnea   . 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   . Allergic rhinitis   . Hyperlipidemia   . Exogenous obesity   . Edema     venous insufficiency  . Diabetes mellitus 2012    T2DM  . Second degree Mobitz II AV block 12/11/08    with transient syncope, resolved s/p PPM  . Paroxysmal atrial fibrillation 03/16/11    diagnosed by PPM interrogation  . Pancytopenia   . Postural dizziness    Past Surgical History  Procedure Date  . Lymph node biopsy     groin  . Sternotomy 2000  . Pericardiectomy 2000  . Penile prosthesis implant     and removal  . Pacemaker insertion 12/11/08    MDT implanted by Dr Johney Frame    Current Outpatient Prescriptions  Medication Sig Dispense Refill  . acetaminophen (TYLENOL) 500 MG tablet 1 to 2 tabs every 4 to 6 hours as needed       . aspirin 81 MG tablet Take 81 mg by mouth daily.        . calcium-vitamin D (OS-CAL 500 + D) 500-200 MG-UNIT per tablet Take 1 tablet by mouth 2 (two) times daily.        . Cholecalciferol (VITAMIN D) 1000 UNITS capsule Take 1,000 Units by mouth 2 (two) times daily.        [name]  dextromethorphan-guaiFENesin (MUCINEX DM) 30-600 MG per 12 hr tablet Take 1 tablet by mouth every 12 (twelve) hours as needed.      . docusate sodium (STOOL SOFTENER) 100 MG capsule as directed.       . famotidine (PEPCID) 20 MG tablet Take 20 mg by mouth at bedtime.        . finasteride (PROSCAR) 5 MG tablet Take 5 mg by mouth daily.        . furosemide (LASIX) 20 MG tablet Take 1 tablet by mouth daily.  May take 1 extra daily as needed for swelling in legs.      [name] KLOR-CON M20 20 MEQ tablet TAKE 1 TABLET DAILY  34 tablet  11  . LEXAPRO 20 MG tablet TAKE ONE-HALF TABLET BY MOUTH EVERY DAY  30 each  6  . metFORMIN (GLUCOPHAGE) 500 MG tablet 1/2 tab qam and 1/2 tab pm      . methylcellulose (CITRUCEL) oral powder 1 scoop in 8oz of water once daily      . mometasone (NASONEX) 50 MCG/ACT nasal spray 0-2 puffs in the morning and 1-2 puffs at bedtime      . Multiple Vitamin (MULTIVITAMIN) tablet Take 1 tablet by mouth daily.        [name]  omeprazole (PRILOSEC) 20 MG capsule Take 20 mg by mouth daily with breakfast.       . oxybutynin (DITROPAN) 5 MG tablet Take 5 mg by mouth daily.      [name] oxymetazoline (AFRIN) 0.05 % nasal spray 2 puffs every 12 hours needed for stuffy nose x 5 days and stop      . polyethylene glycol powder (MIRALAX) powder Take 17 g by mouth 2 (two) times daily.       [name] PROAIR HFA 108 (90 BASE) MCG/ACT inhaler INHALE 1 TO 2 PUFFS BY MOUTH EVERY 4 TO 6 HOURS AS NEEDED  1 Inhaler  11  . silodosin (RAPAFLO) 8 MG CAPS capsule Take 8 mg by mouth daily with breakfast.      . sulindac (CLINORIL) 200 MG tablet Take 1 tablet (200 mg total) by mouth 2 (two) times daily.  60 tablet  1  . traMADol (ULTRAM) 50 MG tablet One every 4 hours if severe cough or pain  40 tablet  0  . traZODone (DESYREL) 150 MG tablet TAKE 1/3 TABLET BY MOUTH AT BEDTIME  30 tablet  11  . DISCONTD: KLOR-CON M20 20 MEQ tablet TAKE 1 TABLET BY MOUTH EVERY DAY  30 tablet  11   Current Facility-Administered Medications    Medication Dose Route Frequency Provider Last Rate Last Dose  . DISCONTD: levalbuterol (XOPENEX) nebulizer solution 0.63 mg  0.63 mg Nebulization Once Tammy Rogers Seeds, NP        No Known Allergies  History   Social History  . Marital Status: Widowed    Spouse Name: N/A    Number of Children: 2  . Years of Education: N/A   Occupational History  . retired    Social History Main Topics  . Smoking status: Former Smoker -- 1.0 packs/day for 30 years    Types: Cigarettes    Quit date: 01/27/1968  . Smokeless tobacco: Former Neurosurgeon  . Alcohol Use: No  . Drug Use: No  . Sexually Active: Not on file   Other Topics Concern  . Not on file   Social History Narrative   [place]    Family History  Problem Relation Age of Onset  . Diabetes Mother   . Liver cancer Brother   . Cancer Sister    Physical Exam: Filed Vitals:   10/26/11 1636  BP: 141/71  Pulse: 78  Height: 5\' 10"  (1.778 m)  Weight: 245 lb (111.131 kg)  SpO2: 98%    GEN- The patient is elderly and chronically ill appearing, alert and oriented x 3 today.   Head- normocephalic, atraumatic Eyes-  Sclera clear, conjunctiva pink Ears- hearing intact Oropharynx- clear Lungs- Clear to ausculation bilaterally, normal work of breathing Chest- pacemaker pocket is well healed Heart- Regular rate and rhythm, no murmurs, rubs or gallops, PMI not laterally displaced GI- soft, NT, ND, + BS Extremities- no clubbing, cyanosis, +1 edema MS- age appropriate atrophy Skin- no rash or lesion Psych- euthymic mood, full affect Neuro- strength and sensation are intact  Pacemaker interrogation- reviewed in detail today,  See PACEART report  ekg today reveals sinus rhythm with V pacing  Assessment and Plan:

## 2011-10-26 NOTE — Patient Instructions (Addendum)
Your physician recommends that you schedule a follow-up appointment in: 6 weeks with Norma Fredrickson, NP and as months with Dr Johney Frame   Your physician has requested that you have an echocardiogram. Echocardiography is a painless test that uses sound waves to create images of your heart. It provides your doctor with information about the size and shape of your heart and how well your heart's chambers and valves are working. This procedure takes approximately one hour. There are no restrictions for this procedure.

## 2011-10-26 NOTE — Assessment & Plan Note (Signed)
Postural dizziness is likely multifactoral and related to anemia/ low intravascular volume status, chronic venous insufficiency/ edema, and possibly some degree of autonomic insufficiency related to his advanced age.  Pacemaker interrogation today reveals normal pacemaker function.  He has had no prolonged afib (all episodes <1 minute) and <1% total burden since last interrogation. Adequate hydration is encouraged.  Treatment of anemia as per hematology. I will place support hose to prevent venous pooling and hopefully improve his intravascular volume status. If not improved, he may benefit from stopping his lasix. I will obtain an echo to evaluate for structural changes.

## 2011-10-26 NOTE — Assessment & Plan Note (Signed)
Support hose as above

## 2011-10-29 ENCOUNTER — Ambulatory Visit (HOSPITAL_COMMUNITY): Payer: Medicare Other | Attending: Cardiology | Admitting: Radiology

## 2011-10-29 DIAGNOSIS — I369 Nonrheumatic tricuspid valve disorder, unspecified: Secondary | ICD-10-CM | POA: Insufficient documentation

## 2011-10-29 DIAGNOSIS — R42 Dizziness and giddiness: Secondary | ICD-10-CM | POA: Insufficient documentation

## 2011-10-29 DIAGNOSIS — R609 Edema, unspecified: Secondary | ICD-10-CM

## 2011-10-29 DIAGNOSIS — I059 Rheumatic mitral valve disease, unspecified: Secondary | ICD-10-CM | POA: Insufficient documentation

## 2011-10-29 DIAGNOSIS — I4891 Unspecified atrial fibrillation: Secondary | ICD-10-CM | POA: Insufficient documentation

## 2011-10-29 NOTE — Progress Notes (Signed)
Echocardiogram performed.  

## 2011-11-11 ENCOUNTER — Other Ambulatory Visit (HOSPITAL_BASED_OUTPATIENT_CLINIC_OR_DEPARTMENT_OTHER): Payer: Medicare Other | Admitting: Lab

## 2011-11-11 ENCOUNTER — Ambulatory Visit (HOSPITAL_BASED_OUTPATIENT_CLINIC_OR_DEPARTMENT_OTHER): Payer: Medicare Other

## 2011-11-11 VITALS — BP 117/74 | HR 78 | Temp 97.6°F

## 2011-11-11 DIAGNOSIS — R21 Rash and other nonspecific skin eruption: Secondary | ICD-10-CM

## 2011-11-11 DIAGNOSIS — E785 Hyperlipidemia, unspecified: Secondary | ICD-10-CM

## 2011-11-11 DIAGNOSIS — IMO0002 Reserved for concepts with insufficient information to code with codable children: Secondary | ICD-10-CM

## 2011-11-11 DIAGNOSIS — D649 Anemia, unspecified: Secondary | ICD-10-CM

## 2011-11-11 DIAGNOSIS — K219 Gastro-esophageal reflux disease without esophagitis: Secondary | ICD-10-CM

## 2011-11-11 DIAGNOSIS — R609 Edema, unspecified: Secondary | ICD-10-CM

## 2011-11-11 DIAGNOSIS — E1365 Other specified diabetes mellitus with hyperglycemia: Secondary | ICD-10-CM

## 2011-11-11 DIAGNOSIS — N139 Obstructive and reflux uropathy, unspecified: Secondary | ICD-10-CM

## 2011-11-11 DIAGNOSIS — I872 Venous insufficiency (chronic) (peripheral): Secondary | ICD-10-CM

## 2011-11-11 DIAGNOSIS — D72819 Decreased white blood cell count, unspecified: Secondary | ICD-10-CM

## 2011-11-11 DIAGNOSIS — C8595 Non-Hodgkin lymphoma, unspecified, lymph nodes of inguinal region and lower limb: Secondary | ICD-10-CM

## 2011-11-11 DIAGNOSIS — K469 Unspecified abdominal hernia without obstruction or gangrene: Secondary | ICD-10-CM

## 2011-11-11 DIAGNOSIS — E559 Vitamin D deficiency, unspecified: Secondary | ICD-10-CM

## 2011-11-11 DIAGNOSIS — R059 Cough, unspecified: Secondary | ICD-10-CM

## 2011-11-11 DIAGNOSIS — J310 Chronic rhinitis: Secondary | ICD-10-CM

## 2011-11-11 DIAGNOSIS — I10 Essential (primary) hypertension: Secondary | ICD-10-CM

## 2011-11-11 DIAGNOSIS — R05 Cough: Secondary | ICD-10-CM

## 2011-11-11 DIAGNOSIS — M2550 Pain in unspecified joint: Secondary | ICD-10-CM

## 2011-11-11 DIAGNOSIS — E669 Obesity, unspecified: Secondary | ICD-10-CM

## 2011-11-11 LAB — CBC WITH DIFFERENTIAL/PLATELET
Basophils Absolute: 0 10*3/uL (ref 0.0–0.1)
Eosinophils Absolute: 0 10*3/uL (ref 0.0–0.5)
HGB: 9.9 g/dL — ABNORMAL LOW (ref 13.0–17.1)
MONO#: 0.1 10*3/uL (ref 0.1–0.9)
NEUT#: 0.8 10*3/uL — ABNORMAL LOW (ref 1.5–6.5)
RDW: 16.2 % — ABNORMAL HIGH (ref 11.0–14.6)
lymph#: 1 10*3/uL (ref 0.9–3.3)

## 2011-11-11 MED ORDER — DARBEPOETIN ALFA-POLYSORBATE 300 MCG/0.6ML IJ SOLN
300.0000 ug | Freq: Once | INTRAMUSCULAR | Status: AC
  Administered 2011-11-11: 300 ug via SUBCUTANEOUS
  Filled 2011-11-11: qty 0.6

## 2011-12-02 ENCOUNTER — Ambulatory Visit (HOSPITAL_BASED_OUTPATIENT_CLINIC_OR_DEPARTMENT_OTHER): Payer: Medicare Other | Admitting: Oncology

## 2011-12-02 ENCOUNTER — Telehealth: Payer: Self-pay | Admitting: Oncology

## 2011-12-02 ENCOUNTER — Ambulatory Visit: Payer: Medicare Other

## 2011-12-02 ENCOUNTER — Other Ambulatory Visit (HOSPITAL_BASED_OUTPATIENT_CLINIC_OR_DEPARTMENT_OTHER): Payer: Medicare Other

## 2011-12-02 VITALS — BP 129/76 | HR 86 | Temp 97.1°F | Resp 20 | Ht 70.0 in | Wt 250.1 lb

## 2011-12-02 DIAGNOSIS — K469 Unspecified abdominal hernia without obstruction or gangrene: Secondary | ICD-10-CM

## 2011-12-02 DIAGNOSIS — C8595 Non-Hodgkin lymphoma, unspecified, lymph nodes of inguinal region and lower limb: Secondary | ICD-10-CM

## 2011-12-02 DIAGNOSIS — R21 Rash and other nonspecific skin eruption: Secondary | ICD-10-CM

## 2011-12-02 DIAGNOSIS — D649 Anemia, unspecified: Secondary | ICD-10-CM

## 2011-12-02 DIAGNOSIS — IMO0002 Reserved for concepts with insufficient information to code with codable children: Secondary | ICD-10-CM

## 2011-12-02 DIAGNOSIS — I872 Venous insufficiency (chronic) (peripheral): Secondary | ICD-10-CM

## 2011-12-02 DIAGNOSIS — M2550 Pain in unspecified joint: Secondary | ICD-10-CM

## 2011-12-02 DIAGNOSIS — D61818 Other pancytopenia: Secondary | ICD-10-CM

## 2011-12-02 DIAGNOSIS — C8299 Follicular lymphoma, unspecified, extranodal and solid organ sites: Secondary | ICD-10-CM

## 2011-12-02 DIAGNOSIS — D472 Monoclonal gammopathy: Secondary | ICD-10-CM

## 2011-12-02 DIAGNOSIS — D72819 Decreased white blood cell count, unspecified: Secondary | ICD-10-CM

## 2011-12-02 DIAGNOSIS — K219 Gastro-esophageal reflux disease without esophagitis: Secondary | ICD-10-CM

## 2011-12-02 DIAGNOSIS — E559 Vitamin D deficiency, unspecified: Secondary | ICD-10-CM

## 2011-12-02 DIAGNOSIS — J310 Chronic rhinitis: Secondary | ICD-10-CM

## 2011-12-02 DIAGNOSIS — I10 Essential (primary) hypertension: Secondary | ICD-10-CM

## 2011-12-02 DIAGNOSIS — E1365 Other specified diabetes mellitus with hyperglycemia: Secondary | ICD-10-CM

## 2011-12-02 DIAGNOSIS — R609 Edema, unspecified: Secondary | ICD-10-CM

## 2011-12-02 DIAGNOSIS — R05 Cough: Secondary | ICD-10-CM

## 2011-12-02 DIAGNOSIS — E669 Obesity, unspecified: Secondary | ICD-10-CM

## 2011-12-02 DIAGNOSIS — R059 Cough, unspecified: Secondary | ICD-10-CM

## 2011-12-02 DIAGNOSIS — N139 Obstructive and reflux uropathy, unspecified: Secondary | ICD-10-CM

## 2011-12-02 DIAGNOSIS — E785 Hyperlipidemia, unspecified: Secondary | ICD-10-CM

## 2011-12-02 LAB — CBC WITH DIFFERENTIAL/PLATELET
BASO%: 0.2 % (ref 0.0–2.0)
EOS%: 2.2 % (ref 0.0–7.0)
HCT: 26.1 % — ABNORMAL LOW (ref 38.4–49.9)
LYMPH%: 57.3 % — ABNORMAL HIGH (ref 14.0–49.0)
MCH: 38.1 pg — ABNORMAL HIGH (ref 27.2–33.4)
MCHC: 35.5 g/dL (ref 32.0–36.0)
MONO%: 2.6 % (ref 0.0–14.0)
NEUT%: 37.7 % — ABNORMAL LOW (ref 39.0–75.0)
Platelets: 102 10*3/uL — ABNORMAL LOW (ref 140–400)
RBC: 2.43 10*6/uL — ABNORMAL LOW (ref 4.20–5.82)
WBC: 1.8 10*3/uL — ABNORMAL LOW (ref 4.0–10.3)

## 2011-12-02 MED ORDER — DARBEPOETIN ALFA-POLYSORBATE 300 MCG/0.6ML IJ SOLN
300.0000 ug | Freq: Once | INTRAMUSCULAR | Status: AC
  Administered 2011-12-02: 300 ug via SUBCUTANEOUS
  Filled 2011-12-02: qty 0.6

## 2011-12-02 NOTE — Progress Notes (Signed)
Hematology and Oncology Follow Up Visit  [name] [id number] [birth year] 78 y.o. 12/02/2011 11:59 AM  CC: [name]. [name], MD, FCCP    Principle Diagnosis: A 78 year old gentleman with the following diagnoses:   1. Follicular lymphoma as a grade 3, stage III, diagnosed 2007, continued to be in remission. 2. Pancytopenia associated with mild neutropenia and macrocytosis possibly indicating early myelodysplasia. 3. Monoclonal gammopathy of undetermined significance.  Prior Therapy:  1. Status post CHOP and rituximab.  He had complete response to therapy concluded in 2007.  No further imaging has been indicated at this time. 2. Status post bone marrow biopsy in April 2011, did not really show any evidence of myelodysplasia or metastatic lymphoma at this time.  Current therapy: Aranesp 300 mcg every 3 weeks to keep his hemoglobin close to 11.  Interim History: [name] presents today for a followup visit.  He has continued to really do very well and have no major issues since the last time I saw him.  He had not had any hospitalization, had not had any illnesses.  Had not reported any evidence of bleeding.  Had not had any infection. His activity level is about the same.  His energy is reasonable and had not had any other complications to report.  He does not report any dysphagia.  He does not report any lymphadenopathy. No night sweats. Overall performance status, activity level remains stable and certainly it was helped by Aransep.  Medications: I have reviewed the patient's current medications. Current outpatient prescriptions:acetaminophen (TYLENOL) 500 MG tablet, 1 to 2 tabs every 4 to 6 hours as needed , Disp: , Rfl: ;  aspirin 81 MG tablet, Take 81 mg by mouth daily.  , Disp: , Rfl: ;  calcium-vitamin D (OS-CAL 500 + D) 500-200 MG-UNIT per tablet, Take 1 tablet by mouth 2 (two) times daily.  , Disp: , Rfl: ;  Cholecalciferol (VITAMIN D) 1000 UNITS capsule, Take 1,000 Units by mouth 2  (two) times daily.  , Disp: , Rfl:  dextromethorphan-guaiFENesin (MUCINEX DM) 30-600 MG per 12 hr tablet, Take 1 tablet by mouth every 12 (twelve) hours as needed., Disp: , Rfl: ;  docusate sodium (STOOL SOFTENER) 100 MG capsule, as directed. , Disp: , Rfl: ;  famotidine (PEPCID) 20 MG tablet, Take 20 mg by mouth at bedtime.  , Disp: , Rfl: ;  finasteride (PROSCAR) 5 MG tablet, Take 5 mg by mouth daily.  , Disp: , Rfl:  furosemide (LASIX) 20 MG tablet, Take 1 tablet by mouth daily.  May take 1 extra daily as needed for swelling in legs., Disp: , Rfl: ;  hydrocortisone cream 1 %, Apply 1 application topically 2 (two) times daily as needed., Disp: , Rfl: ;  KLOR-CON M20 20 MEQ tablet, TAKE 1 TABLET DAILY, Disp: 34 tablet, Rfl: 11;  LEXAPRO 20 MG tablet, TAKE ONE-HALF TABLET BY MOUTH EVERY DAY, Disp: 30 each, Rfl: 6 metFORMIN (GLUCOPHAGE) 500 MG tablet, 1/2 tab qam and 1/2 tab pm, Disp: , Rfl: ;  methylcellulose (CITRUCEL) oral powder, 1 scoop in 8oz of water once daily, Disp: , Rfl: ;  mometasone (NASONEX) 50 MCG/ACT nasal spray, 0-2 puffs in the morning and 1-2 puffs at bedtime, Disp: , Rfl: ;  Multiple Vitamin (MULTIVITAMIN) tablet, Take 1 tablet by mouth daily.  , Disp: , Rfl:  omeprazole (PRILOSEC) 20 MG capsule, Take 20 mg by mouth daily with breakfast. , Disp: , Rfl: ;  oxybutynin (DITROPAN) 5 MG tablet, Take 5  mg by mouth daily., Disp: , Rfl: ;  oxymetazoline (AFRIN) 0.05 % nasal spray, 2 puffs every 12 hours needed for stuffy nose x 5 days and stop, Disp: , Rfl: ;  polyethylene glycol powder (MIRALAX) powder, Take 17 g by mouth 2 (two) times daily. , Disp: , Rfl:  PROAIR HFA 108 (90 BASE) MCG/ACT inhaler, INHALE 1 TO 2 PUFFS BY MOUTH EVERY 4 TO 6 HOURS AS NEEDED, Disp: 1 Inhaler, Rfl: 11;  silodosin (RAPAFLO) 8 MG CAPS capsule, Take 8 mg by mouth daily with breakfast., Disp: , Rfl: ;  sulindac (CLINORIL) 200 MG tablet, Take 1 tablet (200 mg total) by mouth 2 (two) times daily., Disp: 60 tablet, Rfl: 1;   traMADol (ULTRAM) 50 MG tablet, One every 4 hours if severe cough or pain, Disp: 40 tablet, Rfl: 0 traZODone (DESYREL) 150 MG tablet, TAKE 1/3 TABLET BY MOUTH AT BEDTIME, Disp: 30 tablet, Rfl: 11 Current facility-administered medications:[COMPLETED] darbepoetin (ARANESP) injection 300 mcg, 300 mcg, Subcutaneous, Once, Myrtis Ser, NP, 300 mcg at 12/02/11 1154  Allergies: No Known Allergies  Past Medical History, Surgical history, Social history, and Family History were reviewed and updated.  Review of Systems: Constitutional:  Negative for fever, chills, night sweats, anorexia, weight loss, pain. Cardiovascular: no chest pain or dyspnea on exertion Respiratory: no cough, shortness of breath, or wheezing Neurological: no TIA or stroke symptoms Dermatological: negative ENT: negative Skin: Negative. Gastrointestinal: no abdominal pain, change in bowel habits, or black or bloody stools Genito-Urinary: no dysuria, trouble voiding, or hematuria Hematological and Lymphatic: negative Breast: negative Musculoskeletal: negative Remaining ROS negative.  Physical Exam: Blood pressure 129/76, pulse 86, temperature 97.1 F (36.2 C), temperature source Oral, resp. rate 20, height 5\' 10"  (1.778 m), weight 250 lb 1.6 oz (113.445 kg). ECOG: 1 General appearance: alert Head: Normocephalic, without obvious abnormality, atraumatic Neck: no adenopathy, no carotid bruit, no JVD, supple, symmetrical, trachea midline and thyroid not enlarged, symmetric, no tenderness/mass/nodules Lymph nodes: Cervical, supraclavicular, and axillary nodes normal. Heart:regular rate and rhythm, S1, S2 normal, no murmur, click, rub or gallop Lung:chest clear, no wheezing, rales, normal symmetric air entry Abdomen: soft, non-tender, without masses or organomegaly EXT:no erythema, induration, or nodules  Lab Results: Lab Results  Component Value Date   WBC 1.8* 12/02/2011   HGB 9.3* 12/02/2011   HCT 26.1* 12/02/2011    MCV 107.3* 12/02/2011   PLT 102* 12/02/2011     Chemistry      Component Value Date/Time   NA 137 10/21/2011 1205   NA 139 07/29/2011 1308   K 4.2 10/21/2011 1205   K 4.5 07/29/2011 1308   CL 102 10/21/2011 1205   CL 103 07/29/2011 1308   CO2 29 10/21/2011 1205   CO2 31 07/29/2011 1308   BUN 14.0 10/21/2011 1205   BUN 14 07/29/2011 1308   CREATININE 0.8 10/21/2011 1205   CREATININE 0.89 07/29/2011 1308      Component Value Date/Time   CALCIUM 9.8 10/21/2011 1205   CALCIUM 9.2 07/29/2011 1308   ALKPHOS 38* 10/21/2011 1205   ALKPHOS 33* 07/29/2011 1308   AST 14 10/21/2011 1205   AST 17 07/29/2011 1308   ALT 15 10/21/2011 1205   ALT 17 07/29/2011 1308   BILITOT 0.60 10/21/2011 1205   BILITOT 0.5 07/29/2011 1308      Impression and Plan:  This is a 78 year old gentleman with the following issues:  1. Follicular lymphoma diagnosed in 2007.  He has no evidence to suggest recurrent disease.  2. Pancytopenia of unclear etiology, probably myelodysplastic syndrome is the most likely etiology at this time.  His vitamin B12 and folate have been in the normal range.  He had a bone marrow biopsy recently in April of 2011; however, if his counts continue to decrease, which they have been stable recently, then certainly a repeat bone marrow biopsy would be indicated.  I feel for the time being supportive measure, even with a diagnosis of myelodysplasia, would be reasonable for [name].   3. Anemia.  He is to continue on Aranesp 300 mcg every three weeks to keep hemoglobin above 11. He will receive Aranesp today. 4. Monoclonal gammopathy.  His M-spike had been less than 1 g/dL, is unchanged.  Pending today. Continue to monitor that every 6 months.   5. Followup will be set up in about 3 months.  Keymari Sato 11/6/201311:59 AM

## 2011-12-02 NOTE — Telephone Encounter (Signed)
Gave pt appt for lab and injection every 3 weeks and see ML on February 2014 with labs and injection

## 2011-12-08 ENCOUNTER — Other Ambulatory Visit: Payer: Self-pay | Admitting: Internal Medicine

## 2011-12-11 ENCOUNTER — Ambulatory Visit (INDEPENDENT_AMBULATORY_CARE_PROVIDER_SITE_OTHER): Payer: Medicare Other | Admitting: Nurse Practitioner

## 2011-12-11 ENCOUNTER — Encounter: Payer: Self-pay | Admitting: Nurse Practitioner

## 2011-12-11 VITALS — BP 120/60 | HR 68 | Ht 71.0 in | Wt 254.2 lb

## 2011-12-11 DIAGNOSIS — I5032 Chronic diastolic (congestive) heart failure: Secondary | ICD-10-CM

## 2011-12-11 NOTE — Progress Notes (Signed)
[name] Date of Birth: [birth year] Medical Record #[id number]  History of Present Illness: [name] is seen back today for a 6 week check. He is seen for Dr. Johney Frame. He has PAF, pancytopenia, postural dizziness and underlying pacemaker for past syncope and AV block. His other issues include past lymphoma, allergic rhinitis, HLD, obesity, venous insufficiency, and DM. I used to see his wife Pattricia Boss who has passed on.   He was here at the end of September with postural dizziness and chest tightness. His pacemaker checked out ok. No prolonged atrial fib. Support stocking were recommended. His dizziness was felt to be multifactorial and related to anemia/low intravascular volume status, venous insufficiency and edema. An echo was checked as well.   He comes in today. He is here alone. Says he is doing ok "for an old man". Feels a little sluggish at times. Not really dizzy at this time. Still with a little chest aching when he first gets up in the morning. None with walking or moving around. Says it just goes away as he sits and watches TV. Had a nosebleed this morning. Tolerating his medicines. Weight is up about 9 pounds. Does like his salt. He gets regular labs with the cancer center for his blood disorder.   Current Outpatient Prescriptions on File Prior to Visit  Medication Sig Dispense Refill  . acetaminophen (TYLENOL) 500 MG tablet 1 to 2 tabs every 4 to 6 hours as needed       . aspirin 81 MG tablet Take 81 mg by mouth daily.        . calcium-vitamin D (OS-CAL 500 + D) 500-200 MG-UNIT per tablet Take 1 tablet by mouth 2 (two) times daily.        . Cholecalciferol (VITAMIN D) 1000 UNITS capsule Take 1,000 Units by mouth 2 (two) times daily.        [name] dextromethorphan-guaiFENesin (MUCINEX DM) 30-600 MG per 12 hr tablet Take 1 tablet by mouth every 12 (twelve) hours as needed.      . docusate sodium (STOOL SOFTENER) 100 MG capsule as directed.       . famotidine (PEPCID) 20 MG tablet Take  20 mg by mouth at bedtime.        . finasteride (PROSCAR) 5 MG tablet Take 5 mg by mouth daily.        . furosemide (LASIX) 20 MG tablet TAKE 1 TABLET EVERY MORNING  30 tablet  6  . hydrocortisone cream 1 % Apply 1 application topically 2 (two) times daily as needed.      [name] KLOR-CON M20 20 MEQ tablet TAKE 1 TABLET DAILY  34 tablet  11  . LEXAPRO 20 MG tablet TAKE ONE-HALF TABLET BY MOUTH EVERY DAY  30 each  6  . metFORMIN (GLUCOPHAGE) 500 MG tablet 1/2 tab qam and 1/2 tab pm      . methylcellulose (CITRUCEL) oral powder 1 scoop in 8oz of water once daily      . mometasone (NASONEX) 50 MCG/ACT nasal spray 0-2 puffs in the morning and 1-2 puffs at bedtime      . Multiple Vitamin (MULTIVITAMIN) tablet Take 1 tablet by mouth daily.        [name] omeprazole (PRILOSEC) 20 MG capsule Take 20 mg by mouth daily with breakfast.       . oxybutynin (DITROPAN) 5 MG tablet Take 5 mg by mouth daily.      [name] oxymetazoline (AFRIN) 0.05 % nasal spray 2 puffs  every 12 hours needed for stuffy nose x 5 days and stop      . polyethylene glycol powder (MIRALAX) powder Take 17 g by mouth 2 (two) times daily.       . silodosin (RAPAFLO) 8 MG CAPS capsule Take 8 mg by mouth daily with breakfast.      . sulindac (CLINORIL) 200 MG tablet Take 1 tablet (200 mg total) by mouth 2 (two) times daily.  60 tablet  1  . traMADol (ULTRAM) 50 MG tablet One every 4 hours if severe cough or pain  40 tablet  0  . traZODone (DESYREL) 150 MG tablet TAKE 1/3 TABLET BY MOUTH AT BEDTIME  30 tablet  11  . [DISCONTINUED] furosemide (LASIX) 20 MG tablet Take 1 tablet by mouth daily.  May take 1 extra daily as needed for swelling in legs.      [name] PROAIR HFA 108 (90 BASE) MCG/ACT inhaler INHALE 1 TO 2 PUFFS BY MOUTH EVERY 4 TO 6 HOURS AS NEEDED  1 Inhaler  11    No Known Allergies  Past Medical History  Diagnosis Date  . Dyspnea   . 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   . Allergic  rhinitis   . Hyperlipidemia   . Exogenous obesity   . Edema     venous insufficiency  . Diabetes mellitus 2012    T2DM  . Second degree Mobitz II AV block 12/11/08    with transient syncope, resolved s/p PPM  . Paroxysmal atrial fibrillation 03/16/11    diagnosed by PPM interrogation  . Pancytopenia   . Postural dizziness     Past Surgical History  Procedure Date  . Lymph node biopsy     groin  . Sternotomy 2000  . Pericardiectomy 2000  . Penile prosthesis implant     and removal  . Pacemaker insertion 12/11/08    MDT implanted by Dr Johney Frame    History  Smoking status  . Former Smoker -- 1.0 packs/day for 30 years  . Types: Cigarettes  . Quit date: 01/27/1968  Smokeless tobacco  . Former User    History  Alcohol Use No    Family History  Problem Relation Age of Onset  . Diabetes Mother   . Liver cancer Brother   . Cancer Sister     Review of Systems: The review of systems is per the HPI.  All other systems were reviewed and are negative.  Physical Exam: BP 120/60  Pulse 68  Ht 5\' 11"  (1.803 m)  Wt 254 lb 3.2 oz (115.304 kg)  BMI 35.45 kg/m2  SpO2 96% Patient is very pleasant and in no acute distress. He is obese. He looks pale Skin is warm and dry. HEENT is unremarkable. Normocephalic/atraumatic. PERRL. Sclera are nonicteric. Neck is supple. No masses. No JVD. Lungs are clear. Cardiac exam shows a regular rate and rhythm. Abdomen is soft. Extremities are without edema. Gait and ROM are intact. No gross neurologic deficits noted.   LABORATORY DATA:  Lab Results  Component Value Date   WBC 1.8* 12/02/2011   HGB 9.3* 12/02/2011   HCT 26.1* 12/02/2011   PLT 102* 12/02/2011   GLUCOSE 122* 10/21/2011   CHOL 142 11/08/2007   TRIG 184* 11/08/2007   HDL 28.6* 11/08/2007   LDLDIRECT 66.9 11/08/2006   LDLCALC 77 11/08/2007   ALT 15 10/21/2011   AST 14 10/21/2011   NA 137 10/21/2011  K 4.2 10/21/2011   CL 102 10/21/2011   CREATININE 0.8 10/21/2011   BUN 14.0  10/21/2011   CO2 29 10/21/2011   TSH 2.06 06/06/2010   PSA 0.15 Test Methodology: [employer] PSA 10/30/2008   INR 1.16 04/12/2009   HGBA1C 6.1 06/04/2011    Echo Study Conclusions  - Left ventricle: The cavity size was normal. Wall thickness was normal. The estimated ejection fraction was 55%. There was septal-lateral dyssynchrony consistent with RV pacing. Wall motion was normal; there were no regional wall motion abnormalities. Features are consistent with a pseudonormal left ventricular filling pattern, with concomitant abnormal relaxation and increased filling pressure (grade 2 diastolic dysfunction). - Aortic valve: Poorly visualized. The appearance of the valve suggested a bioprosthetic valve but there is no record of this and cannot see the valve well enough to confirm. There was no stenosis. No regurgitation. - Mitral valve: Mildly calcified annulus. Mild regurgitation. - Left atrium: The atrium was mildly to moderately dilated. - Right ventricle: The cavity size was normal. Pacer wire or catheter noted in right ventricle. Systolic function was normal. - Right atrium: The atrium was mildly dilated. - Atrial septum: Highly mobile interatrial septum consistent with atrial septal aneurysm. - Tricuspid valve: Mild-moderate regurgitation. Peak RV-RA gradient:80mm Hg (S). - Pulmonary arteries: PA peak pressure: 40mm Hg (S). - Systemic veins: IVC measured 3.0 cm with minimal respirophasic variation, suggesting RA pressure 20 mmHg.  Wt Readings from Last 3 Encounters:  12/11/11 254 lb 3.2 oz (115.304 kg)  12/02/11 250 lb 1.6 oz (113.445 kg)  10/26/11 245 lb (111.131 kg)   Assessment / Plan: 1. Diastolic heart failure - reminded him of the importance of cutting back on his salt. I have asked him to take an extra dose of his Lasix for the next 3 days.   2. Atypical chest pain - Echo with no wall motion abnormalities. Will continue to manage medically. No exertional symptoms.  3.  Pancytopenia - followed by Dr. Clelia Croft  4. PAF - not a candidate for anticoagulation. Underlying pacemaker in place.   We will see him back in February as planned. He has close follow up with Dr. Clelia Croft with routine labs. Patient is agreeable to this plan and will call if any problems develop in the interim.   Patient is agreeable to this plan and will call if any problems develop in the interim.

## 2011-12-11 NOTE — Patient Instructions (Addendum)
Try to avoid the salt. This is what makes you more swollen and then more short of breath  Take an extra fluid pill (Lasix) today, tomorrow and Sunday, then back to just one a day  Stay on your other medicines  Dr. Johney Frame will see you in February  Call the [facility] office at [phone] if you have any questions, problems or concerns.

## 2011-12-19 ENCOUNTER — Other Ambulatory Visit: Payer: Self-pay | Admitting: Internal Medicine

## 2011-12-22 ENCOUNTER — Ambulatory Visit (HOSPITAL_BASED_OUTPATIENT_CLINIC_OR_DEPARTMENT_OTHER): Payer: Medicare Other

## 2011-12-22 ENCOUNTER — Other Ambulatory Visit (HOSPITAL_BASED_OUTPATIENT_CLINIC_OR_DEPARTMENT_OTHER): Payer: Medicare Other | Admitting: Lab

## 2011-12-22 VITALS — BP 127/65 | HR 98 | Temp 97.6°F

## 2011-12-22 DIAGNOSIS — D649 Anemia, unspecified: Secondary | ICD-10-CM

## 2011-12-22 LAB — IRON AND TIBC
%SAT: 44 % (ref 20–55)
Iron: 158 ug/dL (ref 42–165)

## 2011-12-22 LAB — CBC WITH DIFFERENTIAL/PLATELET
BASO%: 0.2 % (ref 0.0–2.0)
EOS%: 2.9 % (ref 0.0–7.0)
Eosinophils Absolute: 0.1 10*3/uL (ref 0.0–0.5)
MCV: 109.4 fL — ABNORMAL HIGH (ref 79.3–98.0)
MONO%: 2.6 % (ref 0.0–14.0)
NEUT#: 0.7 10*3/uL — ABNORMAL LOW (ref 1.5–6.5)
RBC: 2.55 10*6/uL — ABNORMAL LOW (ref 4.20–5.82)
RDW: 16 % — ABNORMAL HIGH (ref 11.0–14.6)

## 2011-12-22 LAB — COMPREHENSIVE METABOLIC PANEL (CC13)
BUN: 13 mg/dL (ref 7.0–26.0)
CO2: 31 mEq/L — ABNORMAL HIGH (ref 22–29)
Creatinine: 1 mg/dL (ref 0.7–1.3)
Glucose: 158 mg/dl — ABNORMAL HIGH (ref 70–99)
Total Bilirubin: 0.9 mg/dL (ref 0.20–1.20)

## 2011-12-22 LAB — FERRITIN: Ferritin: 219 ng/mL (ref 22–322)

## 2011-12-22 MED ORDER — DARBEPOETIN ALFA-POLYSORBATE 300 MCG/0.6ML IJ SOLN
300.0000 ug | Freq: Once | INTRAMUSCULAR | Status: AC
  Administered 2011-12-22: 300 ug via SUBCUTANEOUS
  Filled 2011-12-22: qty 0.6

## 2012-01-12 ENCOUNTER — Other Ambulatory Visit (HOSPITAL_BASED_OUTPATIENT_CLINIC_OR_DEPARTMENT_OTHER): Payer: Medicare Other | Admitting: Lab

## 2012-01-12 ENCOUNTER — Ambulatory Visit (HOSPITAL_BASED_OUTPATIENT_CLINIC_OR_DEPARTMENT_OTHER): Payer: Medicare Other

## 2012-01-12 VITALS — BP 142/74 | HR 106 | Temp 97.4°F

## 2012-01-12 DIAGNOSIS — D649 Anemia, unspecified: Secondary | ICD-10-CM

## 2012-01-12 LAB — CBC WITH DIFFERENTIAL/PLATELET
BASO%: 0.5 % (ref 0.0–2.0)
Basophils Absolute: 0 10*3/uL (ref 0.0–0.1)
EOS%: 2.6 % (ref 0.0–7.0)
HCT: 26.1 % — ABNORMAL LOW (ref 38.4–49.9)
HGB: 9.1 g/dL — ABNORMAL LOW (ref 13.0–17.1)
MCH: 37.5 pg — ABNORMAL HIGH (ref 27.2–33.4)
MCHC: 34.8 g/dL (ref 32.0–36.0)
MONO%: 2.2 % (ref 0.0–14.0)
NEUT#: 0.6 10*3/uL — ABNORMAL LOW (ref 1.5–6.5)
Platelets: 118 10*3/uL — ABNORMAL LOW (ref 140–400)
RBC: 2.42 10*6/uL — ABNORMAL LOW (ref 4.20–5.82)
RDW: 16.2 % — ABNORMAL HIGH (ref 11.0–14.6)
WBC: 1.5 10*3/uL — ABNORMAL LOW (ref 4.0–10.3)

## 2012-01-12 MED ORDER — DARBEPOETIN ALFA-POLYSORBATE 300 MCG/0.6ML IJ SOLN
300.0000 ug | Freq: Once | INTRAMUSCULAR | Status: AC
  Administered 2012-01-12: 300 ug via SUBCUTANEOUS
  Filled 2012-01-12: qty 0.6

## 2012-01-12 NOTE — Patient Instructions (Signed)
Call MD with any questions 

## 2012-02-02 ENCOUNTER — Ambulatory Visit (HOSPITAL_BASED_OUTPATIENT_CLINIC_OR_DEPARTMENT_OTHER): Payer: Medicare Other

## 2012-02-02 ENCOUNTER — Other Ambulatory Visit (HOSPITAL_BASED_OUTPATIENT_CLINIC_OR_DEPARTMENT_OTHER): Payer: Medicare Other | Admitting: Lab

## 2012-02-02 VITALS — BP 141/75 | HR 92 | Temp 97.5°F

## 2012-02-02 DIAGNOSIS — D649 Anemia, unspecified: Secondary | ICD-10-CM

## 2012-02-02 LAB — CBC WITH DIFFERENTIAL/PLATELET
Eosinophils Absolute: 0.1 10*3/uL (ref 0.0–0.5)
HCT: 26.4 % — ABNORMAL LOW (ref 38.4–49.9)
LYMPH%: 55.3 % — ABNORMAL HIGH (ref 14.0–49.0)
MONO#: 0.1 10*3/uL (ref 0.1–0.9)
NEUT#: 0.6 10*3/uL — ABNORMAL LOW (ref 1.5–6.5)
Platelets: 114 10*3/uL — ABNORMAL LOW (ref 140–400)
RBC: 2.43 10*6/uL — ABNORMAL LOW (ref 4.20–5.82)
WBC: 1.6 10*3/uL — ABNORMAL LOW (ref 4.0–10.3)
lymph#: 0.9 10*3/uL (ref 0.9–3.3)

## 2012-02-02 MED ORDER — DARBEPOETIN ALFA-POLYSORBATE 300 MCG/0.6ML IJ SOLN
300.0000 ug | Freq: Once | INTRAMUSCULAR | Status: AC
  Administered 2012-02-02: 300 ug via SUBCUTANEOUS
  Filled 2012-02-02: qty 0.6

## 2012-02-15 ENCOUNTER — Encounter: Payer: Self-pay | Admitting: Gastroenterology

## 2012-02-15 ENCOUNTER — Ambulatory Visit (INDEPENDENT_AMBULATORY_CARE_PROVIDER_SITE_OTHER): Payer: Medicare Other | Admitting: Internal Medicine

## 2012-02-15 ENCOUNTER — Other Ambulatory Visit (INDEPENDENT_AMBULATORY_CARE_PROVIDER_SITE_OTHER): Payer: Medicare Other

## 2012-02-15 ENCOUNTER — Encounter: Payer: Self-pay | Admitting: Internal Medicine

## 2012-02-15 VITALS — BP 118/74 | HR 93 | Temp 97.0°F | Ht 71.0 in | Wt 258.0 lb

## 2012-02-15 DIAGNOSIS — J45909 Unspecified asthma, uncomplicated: Secondary | ICD-10-CM

## 2012-02-15 DIAGNOSIS — IMO0002 Reserved for concepts with insufficient information to code with codable children: Secondary | ICD-10-CM

## 2012-02-15 DIAGNOSIS — E1365 Other specified diabetes mellitus with hyperglycemia: Secondary | ICD-10-CM

## 2012-02-15 DIAGNOSIS — R609 Edema, unspecified: Secondary | ICD-10-CM

## 2012-02-15 DIAGNOSIS — I10 Essential (primary) hypertension: Secondary | ICD-10-CM

## 2012-02-15 LAB — BASIC METABOLIC PANEL
CO2: 31 mEq/L (ref 19–32)
Calcium: 9.5 mg/dL (ref 8.4–10.5)
Chloride: 96 mEq/L (ref 96–112)
Glucose, Bld: 158 mg/dL — ABNORMAL HIGH (ref 70–99)
Sodium: 135 mEq/L (ref 135–145)

## 2012-02-15 NOTE — Patient Instructions (Addendum)
Please remember to go to the lab  department downstairs for your tests - we will call you with the results when they are available.  Wear your support hose if swollen and take the extra lasix per instructions  Will need office visit with me if needing the rescue inhaler proaire more than you are at present  Please schedule a follow up visit in 3 months but call sooner if needed

## 2012-02-15 NOTE — Progress Notes (Signed)
Subjective:     Patient ID: [name], male   DOB: [birth year]    MRN: [id number]  HPI 44 yowm quit smoking 1970 with morbid obesity with boderline DM, Lymphoma s/p chemo ([facility]) and tendency to peripheral edema that is felt to be dependent.   01/07/2011 f/u ov/Abaigeal Moomaw did bring most recent calendar cc doe x more than walking flat and slow and increased need for saba to 6x  Per day but Sleeping ok without nocturnal  or early am exacerbation  of respiratory  c/o's or need for noct saba. Also denies any obvious fluctuation of symptoms with weather or environmental changes or other aggravating or alleviating factors except as outlined above  >>no changes   02/19/2011 Follow up and medication review / NP - Reports following medication regimen closely. Daughter helps to manage medication and uses weekly pill box. Medications reviewed and patient education provided Appears he is taking meds correctly.  Lab results from 01/07/11 visit reviewed. A1C 5.5 . He does not check sugars at home  Unsure if any hypoglycemic events. No polyuria or polydipsia rec Follow med closely and bring to each visit.  Decrease Metformin 500 1/2 Twice daily  W/ food Low sweet /cholestrol diet   06/04/2011 f/u ov/Benedetto Ryder cc intermittent localized back pain x 10 years comes and goes in past,  came and stayed starting in Jan 2013 = pattern is daily, better sitting and lying down, worse with wt bearing with some rad down R leg but not to foot.  Better p injections/ PT under Dr Hiltz's direction.  No worse with cough. >>sulindac rx   07/03/2011 Acute OV  Complains of  Patient c/o cough and difficulty breathing x 2 weeks  Seen 2 weeks ago , tx w/ steroid taper  Was called in augmentin by on call MD , has couple days left.  Cough is some better but not gone.  Still has dyspnea, esp At bedtime   Still has some wheezing Some edema but mild.  No hemotpysis , fever or chest pain  No discolored mucus  rec Finish Augmentin    Take extra lasix 20mg  daily for next 3 days then as needed.    07/31/2011 f/u ov/Otoniel Myhand cc persistent  Daily cough non-productive esp worse in am p bfast and before bedtime despite taking omeprazole 20 mg each am and ppi hs,  Feels has lots of mucus but not able to cough it up.  No obvious daytime variabilty or assoc   cp or chest tightness, subjective wheeze overt sinus or hb symptoms. No unusual exp hx. Some better p rx as above but persistent/ plateau of symptoms. rec Whenever cough is flaring  1) take mucinex dm 1-2 every 12 hours 2) Take extra omeprazole 20mg   30 min before breakfast GERD diet Prednisone 10 mg take  4 each am x 2 days,   2 each am x 2 days,  1 each am x2days and stop   09/11/2011 f/u ov/Mallie Linnemann re multiple chronic medical problmes  cc "dizzy" esp in am x 3 weeks ? Some better p eats but not completely, and if stands up after bfast gets worse again. Also having intermittent nose bleeds on asa 81 mg per day and did not know to hold this for acive bleeding - hgb trending down a bit followed in cancer center. No other blood loss he's aware of.  No true verigo or viz changes, ha nausea or vomiting or ams. >>change lasix to As needed    10/23/2011  Follow up and Med Review  Returns for follow up and med review . - pt brought all meds with him today.  We reviewed all his meds and organized them w/ updated calendar w/ pt education  Taking meds correctly.   c/o increased positional dizziness, weakness/fatigue mainly in am.  Has been going on for ~2-3 months. Seen last ov w/ lasix changed to As needed   No change in symptoms. Of note he is on 3 bph meds.  Worse first thing in am, changing positions, bending over.  No visual or speech changes  Has occasional chest heaviness intermittently. No exertional chest pain. No jaw pain.  No dyspnea or hemotpysis.  No syncope, palpitations.  No edema.  He does have a pacemaker and has been found to have episodes of atrial fib on previous  interogations.  We are referring you to cardiology  Flu shot today  Discuss with Urology regarding dizziness and prostate meds.  Follow med calendar closely and bring to each visit.    02/15/2012 f/u ov/Eligha Kmetz cc Pt states dizziness is better since stopped rafaflo. He has had some wheezing about 1 wk prior to OV . No sob but very inactive. Using albuterol sev times a week, esp in am's but doesn' t wake him from sound sleep.  No obvious daytime variabilty or assoc chronic cough or cp or chest tightness,  overt sinus or hb symptoms. No unusual exp hx   Sleeping ok without nocturnal  or early am exacerbation  of respiratory  c/o's or need for noct saba. Also denies any obvious fluctuation of symptoms with weather or environmental changes or other aggravating or alleviating factors except as outlined above   ROS  The following are not active complaints unless bolded sore throat, dysphagia, dental problems, itching, sneezing,  nasal congestion or excess/ purulent secretions, ear ache,   fever, chills, sweats, unintended wt loss, pleuritic or exertional cp, hemoptysis,  orthopnea pnd or leg swelling, presyncope, palpitations, heartburn, abdominal pain, anorexia, nausea, vomiting, diarrhea  or change in bowel or urinary habits, change in stools or urine, dysuria,hematuria,  rash, arthralgias, visual complaints, headache, numbness weakness or ataxia or problems with walking or coordination,  change in mood/affect or memory.           Past Medical History:   LYMPHOMA NEC, MLIG, INGUINAL/LOWER LIMB (ICD-202.85) dx 04/2005.......[facility]  - last chemo 10/08  -repeat CT/ABD CT 11/18/07--no reccurence  - Pancytopenia August 06, 2009 > refer back to [facility] > aranesp rx Jan 2012  RHINITIS, ALLERGIC NOS (ICD-477.9)  HYPERLIPIDEMIA NEC/NOS (ICD-272.4)  EXOGENOUS OBESITY (ICD-278.00)  - ideal body weight less than 186  AODM onset 2012 with freq pred for airways issues -HgA1C 5.5 12/12 >metformin  decreased 500mg  1/2 Twice daily   Vitamin D Deficiency- level 29>>44 (4/9//10)  Left Hip pain onset around 6/09............................................................................   Hiltz  - MRI 11/23/2007 c/w L1-2 bulging disc indents the thecal sac with foraminal stenosis  HEALTH MAINTENANCE..........................................................................................Marland KitchenWert  - Td 10/2005  - Pneumovax 10/07 second shot  - CPX 08/20/2010  --colon 02/2005 -int/extern. hems (repeat q7y)  Complex med regimen  --Meds reviewed with pt education and computerized med calendar completed/adjusted. November 08, 2008 , August 21, 2009 updated 02/19/2011 , 10/23/2011  Syncope...........................................................................................................Marland KitchenHochrein  - Mobitz II AV block s/p [employer] pacemaker placement 12/11/2008  [facility].....................................................................................................[facility]  - Pruritic rash 04/2009 > tried lotrisone, referred to [facility] August 06, 2009    Family History:   mother had diabetes   Social History:  Patient states former smoker.  quit smoking in 1970  No ETOH  Retired            Objective:   Physical Exam wt 244 January 16, 2008 > 249 April 24, 2009 > 249 10/06/2010 >  11/27/2010  245 > 01/07/2011 241 > 02/19/11 238 > 06/04/2011  247 >07/03/2011 246 > 07/31/2011  248 > 09/11/2011  247 >246 10/23/2011 > 258 02/15/2012   Ambulatory obese wm in no acute distress  HEENT: nl dentition, turbinates, and orophanx.  Mucus membranes pink and moist. No tongue or throat exudate.  Neck: supple with full ROM. no JVD, node enlargement or TMJ   Lungs: clear and equal bilateral breath sounds. No wheezing, rhonchi or rales.  Chest: RRR. No murmurs, rubs, or gallops.  2 plus peripheral edema.  Abd: soft and non tender. Normal excursion in the supine position.  Ext warm and dry, no calf  tenderness, cyanosis or clubbing. mild/mod chronic venous insufficiency changes. Varicose veins present. Neuro: nl sensorium and gait..     cxr 07/03/11  1. Cardiac enlargement.  2. No acute findings.   Assessment:          Plan:

## 2012-02-16 NOTE — Progress Notes (Signed)
Quick Note:  Spoke with pt and notified of results per Dr. Wert. Pt verbalized understanding and denied any questions.  ______ 

## 2012-02-21 NOTE — Assessment & Plan Note (Signed)
All goals of chronic asthma control met including optimal function and elimination of symptoms with minimal need for rescue therapy.  Contingencies discussed in full including contacting this office immediately if not controlling the symptoms using the rule of two's.    

## 2012-02-21 NOTE — Assessment & Plan Note (Signed)
Lab Results  Component Value Date   HGBA1C 6.4 02/15/2012   Adequate control on present rx, reviewed

## 2012-02-21 NOTE — Assessment & Plan Note (Signed)
Adequate control on present rx, reviewed bmet, ok

## 2012-02-23 ENCOUNTER — Telehealth: Payer: Self-pay | Admitting: Oncology

## 2012-02-23 ENCOUNTER — Ambulatory Visit: Payer: Medicare Other

## 2012-02-23 ENCOUNTER — Other Ambulatory Visit (HOSPITAL_BASED_OUTPATIENT_CLINIC_OR_DEPARTMENT_OTHER): Payer: Medicare Other | Admitting: Lab

## 2012-02-23 ENCOUNTER — Other Ambulatory Visit: Payer: Medicare Other

## 2012-02-23 ENCOUNTER — Ambulatory Visit (HOSPITAL_BASED_OUTPATIENT_CLINIC_OR_DEPARTMENT_OTHER): Payer: Medicare Other

## 2012-02-23 VITALS — BP 130/75 | HR 97 | Temp 97.1°F

## 2012-02-23 DIAGNOSIS — D649 Anemia, unspecified: Secondary | ICD-10-CM

## 2012-02-23 LAB — CBC WITH DIFFERENTIAL/PLATELET
Basophils Absolute: 0 10*3/uL (ref 0.0–0.1)
Eosinophils Absolute: 0.1 10*3/uL (ref 0.0–0.5)
HCT: 26 % — ABNORMAL LOW (ref 38.4–49.9)
HGB: 9 g/dL — ABNORMAL LOW (ref 13.0–17.1)
LYMPH%: 63.5 % — ABNORMAL HIGH (ref 14.0–49.0)
MCV: 107.7 fL — ABNORMAL HIGH (ref 79.3–98.0)
MONO#: 0 10*3/uL — ABNORMAL LOW (ref 0.1–0.9)
MONO%: 1.7 % (ref 0.0–14.0)
NEUT#: 0.6 10*3/uL — ABNORMAL LOW (ref 1.5–6.5)
NEUT%: 31.5 % — ABNORMAL LOW (ref 39.0–75.0)
Platelets: 117 10*3/uL — ABNORMAL LOW (ref 140–400)
RBC: 2.42 10*6/uL — ABNORMAL LOW (ref 4.20–5.82)
WBC: 1.9 10*3/uL — ABNORMAL LOW (ref 4.0–10.3)

## 2012-02-23 MED ORDER — DARBEPOETIN ALFA-POLYSORBATE 300 MCG/0.6ML IJ SOLN
300.0000 ug | Freq: Once | INTRAMUSCULAR | Status: AC
  Administered 2012-02-23: 300 ug via SUBCUTANEOUS
  Filled 2012-02-23: qty 0.6

## 2012-02-23 NOTE — Telephone Encounter (Signed)
pt called and wanted earlier appt.Marland KitchenMarland KitchenDone

## 2012-03-02 ENCOUNTER — Encounter: Payer: Medicare Other | Admitting: Internal Medicine

## 2012-03-04 ENCOUNTER — Encounter: Payer: Self-pay | Admitting: Internal Medicine

## 2012-03-04 ENCOUNTER — Ambulatory Visit (INDEPENDENT_AMBULATORY_CARE_PROVIDER_SITE_OTHER): Payer: Medicare Other | Admitting: Internal Medicine

## 2012-03-04 VITALS — BP 129/77 | HR 93 | Ht 71.0 in | Wt 256.4 lb

## 2012-03-04 DIAGNOSIS — R0602 Shortness of breath: Secondary | ICD-10-CM

## 2012-03-04 DIAGNOSIS — I10 Essential (primary) hypertension: Secondary | ICD-10-CM

## 2012-03-04 DIAGNOSIS — R079 Chest pain, unspecified: Secondary | ICD-10-CM

## 2012-03-04 DIAGNOSIS — I4891 Unspecified atrial fibrillation: Secondary | ICD-10-CM

## 2012-03-04 DIAGNOSIS — I441 Atrioventricular block, second degree: Secondary | ICD-10-CM

## 2012-03-04 LAB — PACEMAKER DEVICE OBSERVATION
AL AMPLITUDE: 4 mv
AL THRESHOLD: 1 V
BAMS-0001: 175 {beats}/min
RV LEAD THRESHOLD: 1 V
VENTRICULAR PACING PM: 100

## 2012-03-04 NOTE — Patient Instructions (Addendum)
Your physician wants you to follow-up in: 6 months with Victor Settle Edmisten,[name] and 12 months with [name] will receive a reminder letter in the mail two months in advance. If you don't receive a letter, please call our office to schedule the follow-up appointment.   Your physician has requested that you have a lexiscan myoview. For further information please visit [email or web address] Please follow instruction sheet, as given.

## 2012-03-06 NOTE — Assessment & Plan Note (Signed)
Normal pacemaker function See Pace Art report No changes today  

## 2012-03-06 NOTE — Assessment & Plan Note (Signed)
Afib burden is very low.  Given his anemia, thrombocytopenia, advanced age, frequent nose bleeds, and co morbidities, he is a poor candidate for anticoagulation at this time.

## 2012-03-06 NOTE — Assessment & Plan Note (Signed)
Stable No change required today    Given multiple medical issues, he will follow-up with my PA Booke Edmisten in 6 months and I will see again in 1 year

## 2012-03-06 NOTE — Progress Notes (Signed)
PCP:  Sandrea Hughs, MD  The patient presents today for electrophysiology followup. He continues to have episodes of chest tightness.  He also reports intermittent L arm pain.  These frequent occur at rest and have previously been felt to be atypical in nature.  Recently however he has developed exertional SOB.  This occurs with walking moderate distance but also noticed SOB when carrying groceries recently.  He denies associated palpitations,  orthopnea, PND, presyncope, syncope, or neurologic sequela.  He has chronic stable edema.  The patient feels that he is tolerating medications without difficulties and is otherwise without complaint today.   Past Medical History  Diagnosis Date  . Dyspnea   . 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   . Allergic rhinitis   . Hyperlipidemia   . Exogenous obesity   . Edema     venous insufficiency  . Diabetes mellitus 2012    T2DM  . Second degree Mobitz II AV block 12/11/08    with transient syncope, resolved s/p PPM  . Paroxysmal atrial fibrillation 03/16/11    diagnosed by PPM interrogation  . Pancytopenia   . Postural dizziness    Past Surgical History  Procedure Laterality Date  . Lymph node biopsy      groin  . Sternotomy  2000  . Pericardiectomy  2000  . Penile prosthesis implant      and removal  . Pacemaker insertion  12/11/08    MDT implanted by Dr Johney Frame    Current Outpatient Prescriptions  Medication Sig Dispense Refill  . acetaminophen (TYLENOL) 500 MG tablet 1 to 2 tabs every 4 to 6 hours as needed       . aspirin 81 MG tablet Take 81 mg by mouth daily.        . calcium-vitamin D (OS-CAL 500 + D) 500-200 MG-UNIT per tablet Take 1 tablet by mouth 2 (two) times daily.        . Cholecalciferol (VITAMIN D) 1000 UNITS capsule Take 1,000 Units by mouth 2 (two) times daily.        [name] dextromethorphan-guaiFENesin (MUCINEX DM) 30-600 MG per 12 hr tablet Take 1 tablet by mouth every  12 (twelve) hours as needed.      . docusate sodium (STOOL SOFTENER) 100 MG capsule as directed.       . famotidine (PEPCID) 20 MG tablet Take 20 mg by mouth at bedtime.        . finasteride (PROSCAR) 5 MG tablet Take 5 mg by mouth daily.        . furosemide (LASIX) 20 MG tablet       . hydrocortisone cream 1 % Apply 1 application topically 2 (two) times daily as needed.      [name] KLOR-CON M20 20 MEQ tablet TAKE 1 TABLET DAILY  34 tablet  11  . LEXAPRO 20 MG tablet TAKE ONE-HALF TABLET BY MOUTH EVERY DAY  30 each  6  . metFORMIN (GLUCOPHAGE) 500 MG tablet 1/2 tab qam and 1/2 tab pm      . methylcellulose (CITRUCEL) oral powder 1 scoop in 8oz of water once daily      . mometasone (NASONEX) 50 MCG/ACT nasal spray 0-2 puffs in the morning and 1-2 puffs at bedtime      . Multiple Vitamin (MULTIVITAMIN) tablet Take 1 tablet by mouth daily.        [name] omeprazole (PRILOSEC) 20 MG capsule Take 20 mg  by mouth daily with breakfast.       . oxybutynin (DITROPAN) 5 MG tablet Take 5 mg by mouth daily.      . polyethylene glycol powder (MIRALAX) powder Take 17 g by mouth daily.       [name] PROAIR HFA 108 (90 BASE) MCG/ACT inhaler INHALE 1 TO 2 PUFFS BY MOUTH EVERY 4 TO 6 HOURS AS NEEDED  1 Inhaler  11  . traMADol (ULTRAM) 50 MG tablet One every 4 hours if severe cough or pain  40 tablet  0  . traZODone (DESYREL) 150 MG tablet TAKE 1/3 TABLET BY MOUTH AT BEDTIME  30 tablet  11  . oxymetazoline (AFRIN) 0.05 % nasal spray 2 puffs every 12 hours needed for stuffy nose x 5 days and stop      . sulindac (CLINORIL) 200 MG tablet Take 1 tablet (200 mg total) by mouth 2 (two) times daily.  60 tablet  1   No current facility-administered medications for this visit.    No Known Allergies  History   Social History  . Marital Status: Widowed    Spouse Name: N/A    Number of Children: 2  . Years of Education: N/A   Occupational History  . retired    Social History Main Topics  . Smoking status: Former Smoker --  1.00 packs/day for 30 years    Types: Cigarettes    Quit date: 01/27/1968  . Smokeless tobacco: Former Neurosurgeon  . Alcohol Use: No  . Drug Use: No  . Sexually Active: No   Other Topics Concern  . Not on file   Social History Narrative   Lives in [place]   No etoh          Family History  Problem Relation Age of Onset  . Diabetes Mother   . Liver cancer Brother   . Cancer Sister    Physical Exam: Filed Vitals:   03/04/12 1508  BP: 129/77  Pulse: 93  Height: 5\' 11"  (1.803 m)  Weight: 256 lb 6.4 oz (116.302 kg)    GEN- The patient is elderly and chronically ill appearing, alert and oriented x 3 today.   Head- normocephalic, atraumatic Eyes-  Sclera clear, conjunctiva pink Ears- hearing intact Oropharynx- clear Lungs- Clear to ausculation bilaterally, normal work of breathing Chest- pacemaker pocket is well healed Heart- Regular rate and rhythm, no murmurs, rubs or gallops, PMI not laterally displaced GI- soft, NT, ND, + BS Extremities- no clubbing, cyanosis, +1 edema MS- age appropriate atrophy Skin- no rash or lesion Psych- euthymic mood, full affect Neuro- strength and sensation are intact  Pacemaker interrogation- reviewed in detail today,  See PACEART report  Assessment and Plan:

## 2012-03-06 NOTE — Assessment & Plan Note (Signed)
The patient has had episodic chest discomfort for a while for which I have been following clinically.  He has recently however developed exertional SOB.  I think that further CV testing is therefore now required.  I will proceed with lexiscan myoview.  If low risk then we will manage medically.  We will consider cath if results are high risk or clinically worrisome.  He will contact my office if symptoms worsen in the interim.

## 2012-03-09 ENCOUNTER — Encounter (HOSPITAL_COMMUNITY): Payer: Medicare Other

## 2012-03-14 ENCOUNTER — Other Ambulatory Visit: Payer: Self-pay | Admitting: Adult Health

## 2012-03-15 ENCOUNTER — Ambulatory Visit (HOSPITAL_COMMUNITY): Payer: Medicare Other | Attending: Cardiovascular Disease | Admitting: Radiology

## 2012-03-15 ENCOUNTER — Ambulatory Visit: Payer: Medicare Other

## 2012-03-15 VITALS — BP 120/77 | HR 74 | Ht 71.0 in | Wt 253.0 lb

## 2012-03-15 DIAGNOSIS — I10 Essential (primary) hypertension: Secondary | ICD-10-CM | POA: Insufficient documentation

## 2012-03-15 DIAGNOSIS — R5383 Other fatigue: Secondary | ICD-10-CM | POA: Insufficient documentation

## 2012-03-15 DIAGNOSIS — Z87891 Personal history of nicotine dependence: Secondary | ICD-10-CM | POA: Insufficient documentation

## 2012-03-15 DIAGNOSIS — R5381 Other malaise: Secondary | ICD-10-CM | POA: Insufficient documentation

## 2012-03-15 DIAGNOSIS — I6529 Occlusion and stenosis of unspecified carotid artery: Secondary | ICD-10-CM | POA: Insufficient documentation

## 2012-03-15 DIAGNOSIS — E119 Type 2 diabetes mellitus without complications: Secondary | ICD-10-CM | POA: Insufficient documentation

## 2012-03-15 DIAGNOSIS — R0789 Other chest pain: Secondary | ICD-10-CM | POA: Insufficient documentation

## 2012-03-15 DIAGNOSIS — E785 Hyperlipidemia, unspecified: Secondary | ICD-10-CM | POA: Insufficient documentation

## 2012-03-15 DIAGNOSIS — R000 Tachycardia, unspecified: Secondary | ICD-10-CM | POA: Insufficient documentation

## 2012-03-15 DIAGNOSIS — E669 Obesity, unspecified: Secondary | ICD-10-CM | POA: Insufficient documentation

## 2012-03-15 DIAGNOSIS — R079 Chest pain, unspecified: Secondary | ICD-10-CM

## 2012-03-15 DIAGNOSIS — R0602 Shortness of breath: Secondary | ICD-10-CM

## 2012-03-15 MED ORDER — ADENOSINE (DIAGNOSTIC) 3 MG/ML IV SOLN
0.5600 mg/kg | Freq: Once | INTRAVENOUS | Status: AC
  Administered 2012-03-15: 64.2 mg via INTRAVENOUS

## 2012-03-15 MED ORDER — TECHNETIUM TC 99M SESTAMIBI GENERIC - CARDIOLITE
11.0000 | Freq: Once | INTRAVENOUS | Status: AC | PRN
  Administered 2012-03-15: 11 via INTRAVENOUS

## 2012-03-15 MED ORDER — TECHNETIUM TC 99M SESTAMIBI GENERIC - CARDIOLITE
33.0000 | Freq: Once | INTRAVENOUS | Status: AC | PRN
  Administered 2012-03-15: 33 via INTRAVENOUS

## 2012-03-15 NOTE — Progress Notes (Signed)
[facility] SITE 3 NUCLEAR MED [address] [place], [place] [address] [phone]    Cardiology Nuclear Med Study  [name] is a 79 y.o. male     MRN : [id number]     DOB: 12-10-1932  Procedure Date: 03/15/2012  Nuclear Med Background Indication for Stress Test:  Evaluation for Ischemia History:  '08 MPS:no ischemia, EF=62%; '10 PTVP; '13 Echo:EF=55%, mild MR Cardiac Risk Factors: Carotid Disease, History of Smoking, Hypertension, Lipids, NIDDM and Obesity  Symptoms:  Chest Tightness>(L) Arm with and without Exertion (last episode of chest discomfort was about 2-days ago), DOE, Fatigue and Rapid HR   Nuclear Pre-Procedure Caffeine/Decaff Intake:  None NPO After: 6:30am   Lungs:  Clear. O2 Sat: 94% on room air. IV 0.9% NS with Angio Cath:  20g  IV Site: R Wrist  IV Started by:  Victor Parsons, RN  Chest Size (in):  50 Cup Size: n/a  Height: 5\' 11"  (1.803 m)  Weight:  253 lb (114.76 kg)  BMI:  Body mass index is 35.3 kg/(m^2). Tech Comments:  n/a    Nuclear Med Study 1 or 2 day study: 1 day  Stress Test Type:  Adenosine  Reading MD: Victor Miss, MD  Order Authorizing Provider:  Hillis Range, MD  Resting Radionuclide: Technetium 64m Sestamibi  Resting Radionuclide Dose: 11.0 mCi   Stress Radionuclide:  Technetium 41m Sestamibi  Stress Radionuclide Dose: 33.0 mCi           Stress Protocol Rest HR: 74 Stress HR: 79  Rest BP: 120/77 Stress BP: 113/57  Exercise Time (min): n/a METS: n/a   Predicted Max HR: 143 bpm % Max HR: 55.24 bpm Rate Pressure Product: 8927   Dose of Adenosine (mg):  60.0 mg Dose of Lexiscan: n/a mg  Dose of Atropine (mg): n/a Dose of Dobutamine: n/a mcg/kg/min (at max HR)  Stress Test Technologist: [name], CMA-N  Nuclear Technologist:  [name], CNMT     Rest Procedure:  Myocardial perfusion imaging was performed at rest 45 minutes following the intravenous administration of Technetium 49m  Sestamibi.  Rest ECG: ventricular pacing  Stress Procedure:  The patient received IV adenosine at 140 mcg/kg/min for 4 minutes.  Technetium 56m Sestamibi was injected at the 2 minute mark and quantitative spect images were obtained after a 45 minute delay.  The patient had a hypotensive response to the infusion.   Stress ECG: No significant change from baseline ECG  QPS Raw Data Images:  Normal; no motion artifact; normal heart/lung ratio. Stress Images:  There is mild apical thinning with normal uptake in other regions. Rest Images:  There is mild apical thinning with normal uptake in other regions. Subtraction (SDS):  No evidence of ischemia. Transient Ischemic Dilatation (Normal <1.22):  0.91 Lung/Heart Ratio (Normal <0.45):  0.42  Quantitative Gated Spect Images QGS EDV:  88 ml QGS ESV:  32 ml  Impression Exercise Capacity:  Adenosine study with no exercise. BP Response:  Hypotensive blood pressure response. Clinical Symptoms:  No significant symptoms noted. ECG Impression:  No significant ST segment change suggestive of ischemia. Comparison with Prior Nuclear Study: No images to compare  Overall Impression:  Low risk stress nuclear study.  There is mild apical thinning but no evidence of ischemia.  The apex seems to contract normally.     LV Ejection Fraction: 64%.  LV Wall Motion:  NL LV Function; NL Wall Motion.    [name], [name]., MD, [facility] 03/15/2012,  5:45 PM Office - [phone] Pager [phone]

## 2012-03-16 ENCOUNTER — Ambulatory Visit (HOSPITAL_BASED_OUTPATIENT_CLINIC_OR_DEPARTMENT_OTHER): Payer: Medicare Other | Admitting: Oncology

## 2012-03-16 ENCOUNTER — Other Ambulatory Visit: Payer: Medicare Other

## 2012-03-16 ENCOUNTER — Ambulatory Visit (HOSPITAL_BASED_OUTPATIENT_CLINIC_OR_DEPARTMENT_OTHER): Payer: Medicare Other

## 2012-03-16 ENCOUNTER — Telehealth: Payer: Self-pay | Admitting: Oncology

## 2012-03-16 ENCOUNTER — Encounter: Payer: Self-pay | Admitting: Oncology

## 2012-03-16 VITALS — BP 133/82 | HR 95 | Temp 98.0°F | Resp 20 | Ht 71.0 in | Wt 256.8 lb

## 2012-03-16 DIAGNOSIS — D472 Monoclonal gammopathy: Secondary | ICD-10-CM

## 2012-03-16 DIAGNOSIS — D649 Anemia, unspecified: Secondary | ICD-10-CM

## 2012-03-16 DIAGNOSIS — C8299 Follicular lymphoma, unspecified, extranodal and solid organ sites: Secondary | ICD-10-CM

## 2012-03-16 DIAGNOSIS — D61818 Other pancytopenia: Secondary | ICD-10-CM

## 2012-03-16 LAB — CBC WITH DIFFERENTIAL/PLATELET
BASO%: 0.6 % (ref 0.0–2.0)
EOS%: 2.7 % (ref 0.0–7.0)
HCT: 24.7 % — ABNORMAL LOW (ref 38.4–49.9)
MCH: 37.2 pg — ABNORMAL HIGH (ref 27.2–33.4)
MCHC: 34.3 g/dL (ref 32.0–36.0)
NEUT%: 33.8 % — ABNORMAL LOW (ref 39.0–75.0)
RBC: 2.28 10*6/uL — ABNORMAL LOW (ref 4.20–5.82)
RDW: 17 % — ABNORMAL HIGH (ref 11.0–14.6)
WBC: 1.9 10*3/uL — ABNORMAL LOW (ref 4.0–10.3)
lymph#: 1.2 10*3/uL (ref 0.9–3.3)

## 2012-03-16 MED ORDER — DARBEPOETIN ALFA-POLYSORBATE 300 MCG/0.6ML IJ SOLN
300.0000 ug | Freq: Once | INTRAMUSCULAR | Status: AC
  Administered 2012-03-16: 300 ug via SUBCUTANEOUS
  Filled 2012-03-16: qty 0.6

## 2012-03-16 NOTE — Progress Notes (Signed)
Hematology and Oncology Follow Up Visit  [name] [id number] 1932-05-18 79 y.o. 03/16/2012 4:36 PM  CC: [name]. Victor Sires, MD, FCCP    Principle Diagnosis: A 79 year old gentleman with the following diagnoses:   1. Follicular lymphoma as a grade 3, stage III, diagnosed 2007, continued to be in remission. 2. Pancytopenia associated with mild neutropenia and macrocytosis possibly indicating early myelodysplasia. 3. Monoclonal gammopathy of undetermined significance.  Prior Therapy:  1. Status post CHOP and rituximab.  He had complete response to therapy concluded in 2007.  No further imaging has been indicated at this time. 2. Status post bone marrow biopsy in April 2011, did not really show any evidence of myelodysplasia or metastatic lymphoma at this time.  Current therapy: Aranesp 300 mcg every 3 weeks to keep his hemoglobin close to 11.  Interim History: [name] presents today for a followup visit.  He has continued to really do very well and have no major issues since the last time I saw him.  He had not had any hospitalization, had not had any illnesses.  Had not reported any evidence of bleeding.  Had not had any infection. His activity level is about the same.  His energy is reasonable and had not had any other complications to report.  He does not report any dysphagia.  He does not report any lymphadenopathy. No night sweats. Overall performance status, activity level remains stable and certainly it was helped by Aransep. Had a stress test yesterday per Cardiology.  Medications: I have reviewed the patient's current medications. Current outpatient prescriptions:acetaminophen (TYLENOL) 500 MG tablet, 1 to 2 tabs every 4 to 6 hours as needed , Disp: , Rfl: ;  aspirin 81 MG tablet, Take 81 mg by mouth daily.  , Disp: , Rfl: ;  calcium-vitamin D (OS-CAL 500 + D) 500-200 MG-UNIT per tablet, Take 1 tablet by mouth 2 (two) times daily.  , Disp: , Rfl: ;  Cholecalciferol (VITAMIN D) 1000  UNITS capsule, Take 1,000 Units by mouth 2 (two) times daily.  , Disp: , Rfl:  dextromethorphan-guaiFENesin (MUCINEX DM) 30-600 MG per 12 hr tablet, Take 1 tablet by mouth every 12 (twelve) hours as needed., Disp: , Rfl: ;  docusate sodium (STOOL SOFTENER) 100 MG capsule, as directed. , Disp: , Rfl: ;  famotidine (PEPCID) 20 MG tablet, Take 20 mg by mouth at bedtime.  , Disp: , Rfl: ;  finasteride (PROSCAR) 5 MG tablet, Take 5 mg by mouth daily.  , Disp: , Rfl: ;  furosemide (LASIX) 20 MG tablet, , Disp: , Rfl:  hydrocortisone cream 1 %, Apply 1 application topically 2 (two) times daily as needed., Disp: , Rfl: ;  KLOR-CON M20 20 MEQ tablet, TAKE 1 TABLET DAILY, Disp: 34 tablet, Rfl: 11;  LEXAPRO 20 MG tablet, TAKE ONE-HALF TABLET BY MOUTH EVERY DAY, Disp: 30 each, Rfl: 6;  metFORMIN (GLUCOPHAGE) 500 MG tablet, 1/2 tab qam and 1/2 tab pm, Disp: , Rfl: ;  methylcellulose (CITRUCEL) oral powder, 1 scoop in 8oz of water once daily, Disp: , Rfl:  mometasone (NASONEX) 50 MCG/ACT nasal spray, 0-2 puffs in the morning and 1-2 puffs at bedtime, Disp: , Rfl: ;  Multiple Vitamin (MULTIVITAMIN) tablet, Take 1 tablet by mouth daily.  , Disp: , Rfl: ;  omeprazole (PRILOSEC) 20 MG capsule, Take 20 mg by mouth daily with breakfast. , Disp: , Rfl: ;  oxybutynin (DITROPAN) 5 MG tablet, Take 5 mg by mouth daily., Disp: , Rfl:  oxymetazoline (  AFRIN) 0.05 % nasal spray, 2 puffs every 12 hours needed for stuffy nose x 5 days and stop, Disp: , Rfl: ;  polyethylene glycol powder (MIRALAX) powder, Take 17 g by mouth daily. , Disp: , Rfl: ;  PROAIR HFA 108 (90 BASE) MCG/ACT inhaler, INHALE 1 TO 2 PUFFS BY MOUTH EVERY 4 TO 6 HOURS AS NEEDED, Disp: 1 Inhaler, Rfl: 11 sulindac (CLINORIL) 200 MG tablet, Take 1 tablet (200 mg total) by mouth 2 (two) times daily., Disp: 60 tablet, Rfl: 1;  traMADol (ULTRAM) 50 MG tablet, One every 4 hours if severe cough or pain, Disp: 40 tablet, Rfl: 0;  traZODone (DESYREL) 150 MG tablet, TAKE 1/3 TABLET BY  MOUTH AT BEDTIME, Disp: 30 tablet, Rfl: 11  Allergies: No Known Allergies  Past Medical History, Surgical history, Social history, and Family History were reviewed and updated.  Review of Systems: Constitutional:  Negative for fever, chills, night sweats, anorexia, weight loss, pain. Cardiovascular: no chest pain or dyspnea on exertion Respiratory: no cough, shortness of breath, or wheezing Neurological: no TIA or stroke symptoms Dermatological: negative ENT: negative Skin: Negative. Gastrointestinal: no abdominal pain, change in bowel habits, or black or bloody stools Genito-Urinary: no dysuria, trouble voiding, or hematuria Hematological and Lymphatic: negative Breast: negative Musculoskeletal: negative Remaining ROS negative.  Physical Exam: Blood pressure 133/82, pulse 95, temperature 98 F (36.7 C), temperature source Oral, resp. rate 20, height 5\' 11"  (1.803 m), weight 256 lb 12.8 oz (116.484 kg). ECOG: 1 General appearance: alert Head: Normocephalic, without obvious abnormality, atraumatic Neck: no adenopathy, no carotid bruit, no JVD, supple, symmetrical, trachea midline and thyroid not enlarged, symmetric, no tenderness/mass/nodules Lymph nodes: Cervical, supraclavicular, and axillary nodes normal. Heart:regular rate and rhythm, S1, S2 normal, no murmur, click, rub or gallop Lung:chest clear, no wheezing, rales, normal symmetric air entry Abdomen: soft, non-tender, without masses or organomegaly EXT:no erythema, induration, or nodules  Lab Results: Lab Results  Component Value Date   WBC 1.9* 03/16/2012   HGB 8.5* 03/16/2012   HCT 24.7* 03/16/2012   MCV 108.3* 03/16/2012   PLT 120* 03/16/2012     Chemistry      Component Value Date/Time   NA 135 02/15/2012 1356   NA 137 12/22/2011 1335   K 3.7 02/15/2012 1356   K 3.9 12/22/2011 1335   CL 96 02/15/2012 1356   CL 98 12/22/2011 1335   CO2 31 02/15/2012 1356   CO2 31* 12/22/2011 1335   BUN 16 02/15/2012 1356   BUN  13.0 12/22/2011 1335   CREATININE 0.9 02/15/2012 1356   CREATININE 1.0 12/22/2011 1335      Component Value Date/Time   CALCIUM 9.5 02/15/2012 1356   CALCIUM 9.4 12/22/2011 1335   ALKPHOS 36* 12/22/2011 1335   ALKPHOS 33* 07/29/2011 1308   AST 17 12/22/2011 1335   AST 17 07/29/2011 1308   ALT 18 12/22/2011 1335   ALT 17 07/29/2011 1308   BILITOT 0.90 12/22/2011 1335   BILITOT 0.5 07/29/2011 1308      Impression and Plan:  This is a 79 year old gentleman with the following issues:  1. Follicular lymphoma diagnosed in 2007.  He has no evidence to suggest recurrent disease. 2. Pancytopenia of unclear etiology, probably myelodysplastic syndrome is the most likely etiology at this time.  His vitamin B12 and folate have been in the normal range.  He had a bone marrow biopsy recently in April of 2011; however, if his counts continue to decrease, which they have been stable  recently, then certainly a repeat bone marrow biopsy would be indicated.  I feel for the time being supportive measure, even with a diagnosis of myelodysplasia, would be reasonable for Mr. [place].   3. Anemia.  He is to continue on Aranesp 300 mcg every three weeks to keep hemoglobin above 11. He will receive Aranesp today. 4. Monoclonal gammopathy.  His M-spike had been less than 1 g/dL, is unchanged. Continue to monitor that every 6 months.   5. Followup will be set up in about 3 months.  [place], [place] 2/19/20144:36 PM

## 2012-03-17 ENCOUNTER — Other Ambulatory Visit: Payer: Self-pay | Admitting: Internal Medicine

## 2012-04-06 ENCOUNTER — Ambulatory Visit (HOSPITAL_BASED_OUTPATIENT_CLINIC_OR_DEPARTMENT_OTHER): Payer: Medicare Other

## 2012-04-06 ENCOUNTER — Other Ambulatory Visit (HOSPITAL_BASED_OUTPATIENT_CLINIC_OR_DEPARTMENT_OTHER): Payer: Medicare Other | Admitting: Lab

## 2012-04-06 VITALS — BP 138/77 | HR 93 | Temp 98.3°F

## 2012-04-06 DIAGNOSIS — I872 Venous insufficiency (chronic) (peripheral): Secondary | ICD-10-CM

## 2012-04-06 DIAGNOSIS — K469 Unspecified abdominal hernia without obstruction or gangrene: Secondary | ICD-10-CM

## 2012-04-06 DIAGNOSIS — D72819 Decreased white blood cell count, unspecified: Secondary | ICD-10-CM

## 2012-04-06 DIAGNOSIS — I10 Essential (primary) hypertension: Secondary | ICD-10-CM

## 2012-04-06 DIAGNOSIS — M2550 Pain in unspecified joint: Secondary | ICD-10-CM

## 2012-04-06 DIAGNOSIS — D649 Anemia, unspecified: Secondary | ICD-10-CM

## 2012-04-06 DIAGNOSIS — R609 Edema, unspecified: Secondary | ICD-10-CM

## 2012-04-06 DIAGNOSIS — E785 Hyperlipidemia, unspecified: Secondary | ICD-10-CM

## 2012-04-06 DIAGNOSIS — J310 Chronic rhinitis: Secondary | ICD-10-CM

## 2012-04-06 DIAGNOSIS — E559 Vitamin D deficiency, unspecified: Secondary | ICD-10-CM

## 2012-04-06 DIAGNOSIS — C8595 Non-Hodgkin lymphoma, unspecified, lymph nodes of inguinal region and lower limb: Secondary | ICD-10-CM

## 2012-04-06 DIAGNOSIS — E669 Obesity, unspecified: Secondary | ICD-10-CM

## 2012-04-06 DIAGNOSIS — R05 Cough: Secondary | ICD-10-CM

## 2012-04-06 DIAGNOSIS — N139 Obstructive and reflux uropathy, unspecified: Secondary | ICD-10-CM

## 2012-04-06 DIAGNOSIS — R21 Rash and other nonspecific skin eruption: Secondary | ICD-10-CM

## 2012-04-06 DIAGNOSIS — E1365 Other specified diabetes mellitus with hyperglycemia: Secondary | ICD-10-CM

## 2012-04-06 DIAGNOSIS — C8299 Follicular lymphoma, unspecified, extranodal and solid organ sites: Secondary | ICD-10-CM

## 2012-04-06 DIAGNOSIS — K219 Gastro-esophageal reflux disease without esophagitis: Secondary | ICD-10-CM

## 2012-04-06 LAB — CBC WITH DIFFERENTIAL/PLATELET
BASO%: 0.5 % (ref 0.0–2.0)
Eosinophils Absolute: 0.1 10*3/uL (ref 0.0–0.5)
LYMPH%: 54.6 % — ABNORMAL HIGH (ref 14.0–49.0)
MONO#: 0 10*3/uL — ABNORMAL LOW (ref 0.1–0.9)
NEUT#: 0.6 10*3/uL — ABNORMAL LOW (ref 1.5–6.5)
Platelets: 101 10*3/uL — ABNORMAL LOW (ref 140–400)
RBC: 2.22 10*6/uL — ABNORMAL LOW (ref 4.20–5.82)
WBC: 1.6 10*3/uL — ABNORMAL LOW (ref 4.0–10.3)
lymph#: 0.9 10*3/uL (ref 0.9–3.3)

## 2012-04-06 MED ORDER — DARBEPOETIN ALFA-POLYSORBATE 300 MCG/0.6ML IJ SOLN
300.0000 ug | Freq: Once | INTRAMUSCULAR | Status: AC
  Administered 2012-04-06: 300 ug via SUBCUTANEOUS
  Filled 2012-04-06: qty 0.6

## 2012-04-06 NOTE — Patient Instructions (Addendum)
Darbepoetin Alfa injection What is this medicine? DARBEPOETIN ALFA (dar be POE e tin AL fa) helps your body make more red blood cells. It is used to treat anemia caused by chronic kidney failure and chemotherapy. This medicine may be used for other purposes; ask your health care provider or pharmacist if you have questions. What should I tell my health care provider before I take this medicine? They need to know if you have any of these conditions: -blood clotting disorders or history of blood clots -cancer patient not on chemotherapy -cystic fibrosis -heart disease, such as angina, heart failure, or a history of a heart attack -hemoglobin level of 12 g/dL or greater -high blood pressure -low levels of folate, iron, or vitamin B12 -seizures -an unusual or allergic reaction to darbepoetin, erythropoietin, albumin, hamster proteins, latex, other medicines, foods, dyes, or preservatives -pregnant or trying to get pregnant -breast-feeding How should I use this medicine? This medicine is for injection into a vein or under the skin. It is usually given by a health care professional in a hospital or clinic setting. If you get this medicine at home, you will be taught how to prepare and give this medicine. Do not shake the solution before you withdraw a dose. Use exactly as directed. Take your medicine at regular intervals. Do not take your medicine more often than directed. It is important that you put your used needles and syringes in a special sharps container. Do not put them in a trash can. If you do not have a sharps container, call your pharmacist or healthcare provider to get one. Talk to your pediatrician regarding the use of this medicine in children. While this medicine may be used in children as young as 1 year for selected conditions, precautions do apply. Overdosage: If you think you have taken too much of this medicine contact a poison control center or emergency room at once. NOTE:  This medicine is only for you. Do not share this medicine with others. What if I miss a dose? If you miss a dose, take it as soon as you can. If it is almost time for your next dose, take only that dose. Do not take double or extra doses. What may interact with this medicine? Do not take this medicine with any of the following medications: -epoetin alfa This list may not describe all possible interactions. Give your health care provider a list of all the medicines, herbs, non-prescription drugs, or dietary supplements you use. Also tell them if you smoke, drink alcohol, or use illegal drugs. Some items may interact with your medicine. What should I watch for while using this medicine? Visit your prescriber or health care professional for regular checks on your progress and for the needed blood tests and blood pressure measurements. It is especially important for the doctor to make sure your hemoglobin level is in the desired range, to limit the risk of potential side effects and to give you the best benefit. Keep all appointments for any recommended tests. Check your blood pressure as directed. Ask your doctor what your blood pressure should be and when you should contact him or her. As your body makes more red blood cells, you may need to take iron, folic acid, or vitamin B supplements. Ask your doctor or health care provider which products are right for you. If you have kidney disease continue dietary restrictions, even though this medication can make you feel better. Talk with your doctor or health care professional about the   foods you eat and the vitamins that you take. What side effects may I notice from receiving this medicine? Side effects that you should report to your doctor or health care professional as soon as possible: -allergic reactions like skin rash, itching or hives, swelling of the face, lips, or tongue -breathing problems -changes in vision -chest pain -confusion, trouble speaking  or understanding -feeling faint or lightheaded, falls -high blood pressure -muscle aches or pains -pain, swelling, warmth in the leg -rapid weight gain -severe headaches -sudden numbness or weakness of the face, arm or leg -trouble walking, dizziness, loss of balance or coordination -seizures (convulsions) -swelling of the ankles, feet, hands -unusually weak or tired Side effects that usually do not require medical attention (report to your doctor or health care professional if they continue or are bothersome): -diarrhea -fever, chills (flu-like symptoms) -headaches -nausea, vomiting -redness, stinging, or swelling at site where injected This list may not describe all possible side effects. Call your doctor for medical advice about side effects. You may report side effects to FDA at 1-800-FDA-1088. Where should I keep my medicine? Keep out of the reach of children. Store in a refrigerator between 2 and 8 degrees C (36 and 46 degrees F). Do not freeze. Do not shake. Throw away any unused portion if using a single-dose vial. Throw away any unused medicine after the expiration date. NOTE: This sheet is a summary. It may not cover all possible information. If you have questions about this medicine, talk to your doctor, pharmacist, or health care provider.  2013, [employer]. (12/27/2007 10:23:57 AM)  

## 2012-04-06 NOTE — Progress Notes (Signed)
Patient here for Aranesp injection.   HGB 8.3  About the same as three weeks ago.  C/o feeling somewhat drained and SOB when walking and rushing around.  O2 Sat 96-98. Encouraged to not rush around and if he starts to feel worse to call the office or go to the Emergency Room.   Patient is in agreement with this plan.

## 2012-04-27 ENCOUNTER — Ambulatory Visit (HOSPITAL_BASED_OUTPATIENT_CLINIC_OR_DEPARTMENT_OTHER): Payer: Medicare Other

## 2012-04-27 ENCOUNTER — Other Ambulatory Visit (HOSPITAL_BASED_OUTPATIENT_CLINIC_OR_DEPARTMENT_OTHER): Payer: Medicare Other

## 2012-04-27 VITALS — BP 140/85 | HR 90 | Temp 98.1°F

## 2012-04-27 DIAGNOSIS — D649 Anemia, unspecified: Secondary | ICD-10-CM

## 2012-04-27 LAB — CBC WITH DIFFERENTIAL/PLATELET
Basophils Absolute: 0 10*3/uL (ref 0.0–0.1)
Eosinophils Absolute: 0.1 10*3/uL (ref 0.0–0.5)
HCT: 24.8 % — ABNORMAL LOW (ref 38.4–49.9)
HGB: 8.7 g/dL — ABNORMAL LOW (ref 13.0–17.1)
LYMPH%: 56.6 % — ABNORMAL HIGH (ref 14.0–49.0)
MCHC: 35 g/dL (ref 32.0–36.0)
MONO#: 0 10*3/uL — ABNORMAL LOW (ref 0.1–0.9)
NEUT#: 0.6 10*3/uL — ABNORMAL LOW (ref 1.5–6.5)
NEUT%: 36.8 % — ABNORMAL LOW (ref 39.0–75.0)
Platelets: 120 10*3/uL — ABNORMAL LOW (ref 140–400)
WBC: 1.7 10*3/uL — ABNORMAL LOW (ref 4.0–10.3)
lymph#: 1 10*3/uL (ref 0.9–3.3)

## 2012-04-27 MED ORDER — DARBEPOETIN ALFA-POLYSORBATE 300 MCG/0.6ML IJ SOLN
300.0000 ug | Freq: Once | INTRAMUSCULAR | Status: AC
  Administered 2012-04-27: 300 ug via SUBCUTANEOUS
  Filled 2012-04-27: qty 0.6

## 2012-04-27 NOTE — Patient Instructions (Addendum)
Darbepoetin Alfa injection What is this medicine? DARBEPOETIN ALFA (dar be POE e tin AL fa) helps your body make more red blood cells. It is used to treat anemia caused by chronic kidney failure and chemotherapy. This medicine may be used for other purposes; ask your health care provider or pharmacist if you have questions. What should I tell my health care provider before I take this medicine? They need to know if you have any of these conditions: -blood clotting disorders or history of blood clots -cancer patient not on chemotherapy -cystic fibrosis -heart disease, such as angina, heart failure, or a history of a heart attack -hemoglobin level of 12 g/dL or greater -high blood pressure -low levels of folate, iron, or vitamin B12 -seizures -an unusual or allergic reaction to darbepoetin, erythropoietin, albumin, hamster proteins, latex, other medicines, foods, dyes, or preservatives -pregnant or trying to get pregnant -breast-feeding How should I use this medicine? This medicine is for injection into a vein or under the skin. It is usually given by a health care professional in a hospital or clinic setting. If you get this medicine at home, you will be taught how to prepare and give this medicine. Do not shake the solution before you withdraw a dose. Use exactly as directed. Take your medicine at regular intervals. Do not take your medicine more often than directed. It is important that you put your used needles and syringes in a special sharps container. Do not put them in a trash can. If you do not have a sharps container, call your pharmacist or healthcare provider to get one. Talk to your pediatrician regarding the use of this medicine in children. While this medicine may be used in children as young as 1 year for selected conditions, precautions do apply. Overdosage: If you think you have taken too much of this medicine contact a poison control center or emergency room at once. NOTE:  This medicine is only for you. Do not share this medicine with others. What if I miss a dose? If you miss a dose, take it as soon as you can. If it is almost time for your next dose, take only that dose. Do not take double or extra doses. What may interact with this medicine? Do not take this medicine with any of the following medications: -epoetin alfa This list may not describe all possible interactions. Give your health care provider a list of all the medicines, herbs, non-prescription drugs, or dietary supplements you use. Also tell them if you smoke, drink alcohol, or use illegal drugs. Some items may interact with your medicine. What should I watch for while using this medicine? Visit your prescriber or health care professional for regular checks on your progress and for the needed blood tests and blood pressure measurements. It is especially important for the doctor to make sure your hemoglobin level is in the desired range, to limit the risk of potential side effects and to give you the best benefit. Keep all appointments for any recommended tests. Check your blood pressure as directed. Ask your doctor what your blood pressure should be and when you should contact him or her. As your body makes more red blood cells, you may need to take iron, folic acid, or vitamin B supplements. Ask your doctor or health care provider which products are right for you. If you have kidney disease continue dietary restrictions, even though this medication can make you feel better. Talk with your doctor or health care professional about the   foods you eat and the vitamins that you take. What side effects may I notice from receiving this medicine? Side effects that you should report to your doctor or health care professional as soon as possible: -allergic reactions like skin rash, itching or hives, swelling of the face, lips, or tongue -breathing problems -changes in vision -chest pain -confusion, trouble speaking  or understanding -feeling faint or lightheaded, falls -high blood pressure -muscle aches or pains -pain, swelling, warmth in the leg -rapid weight gain -severe headaches -sudden numbness or weakness of the face, arm or leg -trouble walking, dizziness, loss of balance or coordination -seizures (convulsions) -swelling of the ankles, feet, hands -unusually weak or tired Side effects that usually do not require medical attention (report to your doctor or health care professional if they continue or are bothersome): -diarrhea -fever, chills (flu-like symptoms) -headaches -nausea, vomiting -redness, stinging, or swelling at site where injected This list may not describe all possible side effects. Call your doctor for medical advice about side effects. You may report side effects to FDA at 1-800-FDA-1088. Where should I keep my medicine? Keep out of the reach of children. Store in a refrigerator between 2 and 8 degrees C (36 and 46 degrees F). Do not freeze. Do not shake. Throw away any unused portion if using a single-dose vial. Throw away any unused medicine after the expiration date. NOTE: This sheet is a summary. It may not cover all possible information. If you have questions about this medicine, talk to your doctor, pharmacist, or health care provider.  2013, [employer]. (12/27/2007 10:23:57 AM)  

## 2012-05-18 ENCOUNTER — Other Ambulatory Visit (HOSPITAL_BASED_OUTPATIENT_CLINIC_OR_DEPARTMENT_OTHER): Payer: Medicare Other | Admitting: Lab

## 2012-05-18 ENCOUNTER — Ambulatory Visit (HOSPITAL_BASED_OUTPATIENT_CLINIC_OR_DEPARTMENT_OTHER): Payer: Medicare Other

## 2012-05-18 VITALS — BP 141/73 | HR 88 | Temp 98.3°F

## 2012-05-18 DIAGNOSIS — D649 Anemia, unspecified: Secondary | ICD-10-CM

## 2012-05-18 LAB — CBC WITH DIFFERENTIAL/PLATELET
BASO%: 0.6 % (ref 0.0–2.0)
Basophils Absolute: 0 10*3/uL (ref 0.0–0.1)
EOS%: 2.3 % (ref 0.0–7.0)
HCT: 25.8 % — ABNORMAL LOW (ref 38.4–49.9)
HGB: 9 g/dL — ABNORMAL LOW (ref 13.0–17.1)
LYMPH%: 55.6 % — ABNORMAL HIGH (ref 14.0–49.0)
MCH: 37.7 pg — ABNORMAL HIGH (ref 27.2–33.4)
MCHC: 34.9 g/dL (ref 32.0–36.0)
MCV: 108.1 fL — ABNORMAL HIGH (ref 79.3–98.0)
NEUT%: 39.2 % (ref 39.0–75.0)
Platelets: 124 10*3/uL — ABNORMAL LOW (ref 140–400)
lymph#: 0.9 10*3/uL (ref 0.9–3.3)

## 2012-05-18 MED ORDER — DARBEPOETIN ALFA-POLYSORBATE 300 MCG/0.6ML IJ SOLN
300.0000 ug | Freq: Once | INTRAMUSCULAR | Status: AC
  Administered 2012-05-18: 300 ug via SUBCUTANEOUS
  Filled 2012-05-18: qty 0.6

## 2012-05-19 ENCOUNTER — Ambulatory Visit (INDEPENDENT_AMBULATORY_CARE_PROVIDER_SITE_OTHER): Payer: Medicare Other | Admitting: Internal Medicine

## 2012-05-19 ENCOUNTER — Encounter: Payer: Self-pay | Admitting: Internal Medicine

## 2012-05-19 ENCOUNTER — Other Ambulatory Visit (INDEPENDENT_AMBULATORY_CARE_PROVIDER_SITE_OTHER): Payer: Medicare Other

## 2012-05-19 VITALS — BP 138/64 | HR 86 | Temp 97.8°F | Ht 71.0 in | Wt 258.0 lb

## 2012-05-19 DIAGNOSIS — E1365 Other specified diabetes mellitus with hyperglycemia: Secondary | ICD-10-CM

## 2012-05-19 DIAGNOSIS — J45909 Unspecified asthma, uncomplicated: Secondary | ICD-10-CM

## 2012-05-19 DIAGNOSIS — J310 Chronic rhinitis: Secondary | ICD-10-CM

## 2012-05-19 MED ORDER — PREDNISONE (PAK) 10 MG PO TABS: ORAL_TABLET | ORAL | Status: DC

## 2012-05-19 NOTE — Progress Notes (Signed)
Subjective:     Patient ID: [name], male   DOB: 01-Oct-1932    MRN: [id number]  HPI 85 yowm quit smoking 1970 with morbid obesity with boderline DM, Lymphoma s/p chemo ([facility]) and tendency to peripheral edema that is felt to be dependent.   01/07/2011 f/u ov/Draden Cottingham did bring most recent calendar cc doe x more than walking flat and slow and increased need for saba to 6x  Per day but Sleeping ok without nocturnal  or early am exacerbation  of respiratory  c/o's or need for noct saba. Also denies any obvious fluctuation of symptoms with weather or environmental changes or other aggravating or alleviating factors except as outlined above  >>no changes   02/19/2011 Follow up and medication review / NP - Reports following medication regimen closely. Daughter helps to manage medication and uses weekly pill box. Medications reviewed and patient education provided Appears he is taking meds correctly.  Lab results from 01/07/11 visit reviewed. A1C 5.5 . He does not check sugars at home  Unsure if any hypoglycemic events. No polyuria or polydipsia rec Follow med closely and bring to each visit.  Decrease Metformin 500 1/2 Twice daily  W/ food Low sweet /cholestrol diet   06/04/2011 f/u ov/[name] cc intermittent localized back pain x 10 years comes and goes in past,  came and stayed starting in Jan 2013 = pattern is daily, better sitting and lying down, worse with wt bearing with some rad down R leg but not to foot.  Better p injections/ PT under Dr Hiltz's direction.  No worse with cough. >>sulindac rx   07/03/2011 Acute OV  Complains of  Patient c/o cough and difficulty breathing x 2 weeks  Seen 2 weeks ago , tx w/ steroid taper  Was called in augmentin by on call MD , has couple days left.  Cough is some better but not gone.  Still has dyspnea, esp At bedtime   Still has some wheezing Some edema but mild.  No hemotpysis , fever or chest pain  No discolored mucus  rec Finish Augmentin   Take extra lasix 20mg  daily for next 3 days then as needed.    07/31/2011 f/u ov/[name] cc persistent  Daily cough non-productive esp worse in am p bfast and before bedtime despite taking omeprazole 20 mg each am and ppi hs,  Feels has lots of mucus but not able to cough it up.  No obvious daytime variabilty or assoc   cp or chest tightness, subjective wheeze overt sinus or hb symptoms. No unusual exp hx. Some better p rx as above but persistent/ plateau of symptoms. rec Whenever cough is flaring  1) take mucinex dm 1-2 every 12 hours 2) Take extra omeprazole 20mg   30 min before breakfast GERD diet Prednisone 10 mg take  4 each am x 2 days,   2 each am x 2 days,  1 each am x2days and stop   09/11/2011 f/u ov/[name] re multiple chronic medical problmes  cc "dizzy" esp in am x 3 weeks ? Some better p eats but not completely, and if stands up after bfast gets worse again. Also having intermittent nose bleeds on asa 81 mg per day and did not know to hold this for acive bleeding - hgb trending down a bit followed in cancer center. No other blood loss he's aware of.  No true verigo or viz changes, ha nausea or vomiting or ams. >>change lasix to As needed    10/23/2011 Follow  up and Med Review  Returns for follow up and med review . - pt brought all meds with him today.  We reviewed all his meds and organized them w/ updated calendar w/ pt education  Taking meds correctly.   c/o increased positional dizziness, weakness/fatigue mainly in am.  Has been going on for ~2-3 months. Seen last ov w/ lasix changed to As needed   No change in symptoms. Of note he is on 3 bph meds.  Worse first thing in am, changing positions, bending over.  No visual or speech changes  Has occasional chest heaviness intermittently. No exertional chest pain. No jaw pain.  No dyspnea or hemotpysis.  No syncope, palpitations.  No edema.  He does have a pacemaker and has been found to have episodes of atrial fib on previous  interogations.  We are referring you to cardiology  Flu shot today  Discuss with Urology regarding dizziness and prostate meds.  Follow med calendar closely and bring to each visit.    02/15/2012 f/u ov/[name] cc Pt states dizziness is better since stopped rafaflo. He has had some wheezing about 1 wk prior to OV . No sob but very inactive. Using albuterol sev times a week, esp in am's but doesn' t wake him from sound sleep. rec Wear your support hose if swollen and take the extra lasix per instructions Will need office visit with me if needing the rescue inhaler proaire more than you are at present    05/19/2012 f/u ov/[name] re obesity/ dm Chief Complaint  Patient presents with  . Follow-up    Breathing worse the past couple of wks, relates to pollen in the air.   feels dizzy in head before gets shot every 3 weeks for anemia. Min itching/sneezing with onset of pollen season has not used prns appropriately from med calendar action plan. Also having epistaxis sev times a day  No obvious daytime variabilty or assoc chronic cough or cp or chest tightness,  overt   hb symptoms. No unusual exp hx   Sleeping ok without nocturnal  or early am exacerbation  of respiratory  c/o's or need for noct saba. Also denies any obvious fluctuation of symptoms with weather or environmental changes or other aggravating or alleviating factors except as outlined above   ROS  The following are not active complaints unless bolded sore throat, dysphagia, dental problems, itching, sneezing,  nasal congestion or excess/ purulent secretions, ear ache,   fever, chills, sweats, unintended wt loss, pleuritic or exertional cp, hemoptysis,  orthopnea pnd or leg swelling, presyncope, palpitations, heartburn, abdominal pain, anorexia, nausea, vomiting, diarrhea  or change in bowel or urinary habits, change in stools or urine, dysuria,hematuria,  rash, arthralgias, visual complaints, headache, numbness weakness or ataxia or problems  with walking or coordination,  change in mood/affect or memory.           Past Medical History:   LYMPHOMA NEC, MLIG, INGUINAL/LOWER LIMB (ICD-202.85) dx 04/2005.......[facility]  - last chemo 10/08  -repeat CT/ABD CT 11/18/07--no reccurence  - Pancytopenia August 06, 2009 > refer back to [facility] > aranesp rx Jan 2012  RHINITIS, ALLERGIC NOS (ICD-477.9)  HYPERLIPIDEMIA NEC/NOS (ICD-272.4)  EXOGENOUS OBESITY (ICD-278.00)  - ideal body weight less than 186  AODM onset 2012 with freq pred for airways issues -HgA1C 5.5 12/12 >metformin decreased 500mg  1/2 Twice daily   Vitamin D Deficiency- level 29>>44 (4/9//10)  Left Hip pain onset around 6/09............................................................................   Hiltz  - MRI 11/23/2007 c/w L1-2 bulging  disc indents the thecal sac with foraminal stenosis  HEALTH MAINTENANCE..........................................................................................[facility]  - Td 10/2005  - Pneumovax 10/07 second shot  - CPX 08/20/2010  --colon 02/2005 -int/extern. hems (repeat q7y)  Complex med regimen  --Meds reviewed with pt education and computerized med calendar completed/adjusted. November 08, 2008 , August 21, 2009 updated 02/19/2011 , 10/23/2011  Syncope...........................................................................................................[facility]  - Mobitz II AV block s/p Med[employer]onic pacemaker placement 12/11/2008  [facility].....................................................................................................[facility]  - Pruritic rash 04/2009 > tried lotrisone, referred to [facility] August 06, 2009    Family History:   mother had diabetes   Social History:  Patient states former smoker.  quit smoking in 1970  No ETOH  Retired            Objective:   Physical Exam wt 244 January 16, 2008 > 249 April 24, 2009 > 249 10/06/2010 >  11/27/2010  245 > 01/07/2011 241 > 02/19/11 238 >  06/04/2011  247 >07/03/2011 246 > 07/31/2011  248 > 09/11/2011  247 >246 10/23/2011 > 258 02/15/2012 > 05/19/2012  258   Ambulatory obese wm in no acute distress  HEENT: nl dentition, turbinates, and orophanx.  Mucus membranes pink and moist. No tongue or throat exudate.  Neck: supple with full ROM. no JVD, node enlargement or TMJ   Lungs: clear and equal bilateral breath sounds. No wheezing, rhonchi or rales.  Chest: RRR. No murmurs, rubs, or gallops.  2 plus peripheral edema.  Abd: soft and non tender. Normal excursion in the supine position.  Ext warm and dry, no calf tenderness, cyanosis or clubbing. mild/mod chronic venous insufficiency changes. Varicose veins present. Neuro: nl sensorium and gait..     cxr 07/03/11  1. Cardiac enlargement.  2. No acute findings.   Assessment:          Plan:

## 2012-05-19 NOTE — Patient Instructions (Addendum)
Prednisone 10 mg take  4 each am x 2 days,   2 each am x 2 days,  1 each am x 2 days and stop   Please see patient coordinator before you leave today  to schedule ent evaluation for your nose bleeds  See calendar for specific medication instructions and bring it back for each and every office visit for every healthcare provider you see.  Without it,  you may not receive the best quality medical care that we feel you deserve.  You will note that the calendar groups together  your maintenance  medications that are timed at particular times of the day.  Think of this as your checklist for what your doctor has instructed you to do until your next evaluation to see what benefit  there is  to staying on a consistent group of medications intended to keep you well.  The other group at the bottom is entirely up to you to use as you see fit  for specific symptoms that may arise between visits that require you to treat them on an as needed basis.  Think of this as your action plan or "what if" list.   Separating the top medications from the bottom group is fundamental to providing you adequate care going forward.    Please remember to go to the lab  department downstairs for your tests - we will call you with the results when they are available.  Please schedule a follow up visit in 3 months but call sooner if needed

## 2012-05-20 ENCOUNTER — Ambulatory Visit: Payer: Medicare Other | Admitting: Internal Medicine

## 2012-05-20 NOTE — Progress Notes (Signed)
Quick Note:  Spoke with patient, made him aware of results/recs as listed below per MW Patient verbalized understanding and nothing further needed at this time ______

## 2012-05-23 NOTE — Assessment & Plan Note (Signed)
Adequate control on present rx despite flare of rhinitis with season change    Each maintenance medication was reviewed in detail including most importantly the difference between maintenance and as needed and under what circumstances the prns are to be used. This was done in the context of a medication calendar review which provided the patient with a user-friendly unambiguous mechanism for medication administration and reconciliation and provides an action plan for all active problems. It is critical that this be shown to every doctor  for modification during the office visit if necessary so the patient can use it as a working document.

## 2012-05-23 NOTE — Assessment & Plan Note (Signed)
-   Optimal technique for using nasal steroids reviewed with pt and daughter in writing including Afrin x 5 day cycles on/off -CT sinus 06/20/10>> neg  Prednisone 10 mg take  4 each am x 2 days,   2 each am x 2 days,  1 each am x2days and stop  Refer to ENT re epistaxis

## 2012-06-08 ENCOUNTER — Other Ambulatory Visit: Payer: Medicare Other | Admitting: Lab

## 2012-06-08 ENCOUNTER — Other Ambulatory Visit (HOSPITAL_BASED_OUTPATIENT_CLINIC_OR_DEPARTMENT_OTHER): Payer: Medicare Other | Admitting: Lab

## 2012-06-08 ENCOUNTER — Ambulatory Visit (HOSPITAL_BASED_OUTPATIENT_CLINIC_OR_DEPARTMENT_OTHER): Payer: Medicare Other

## 2012-06-08 ENCOUNTER — Ambulatory Visit (HOSPITAL_BASED_OUTPATIENT_CLINIC_OR_DEPARTMENT_OTHER): Payer: Medicare Other | Admitting: Oncology

## 2012-06-08 ENCOUNTER — Telehealth: Payer: Self-pay | Admitting: Oncology

## 2012-06-08 ENCOUNTER — Ambulatory Visit: Payer: Medicare Other

## 2012-06-08 VITALS — BP 122/75 | HR 85 | Temp 97.5°F | Resp 18 | Ht 71.0 in | Wt 254.6 lb

## 2012-06-08 DIAGNOSIS — D649 Anemia, unspecified: Secondary | ICD-10-CM

## 2012-06-08 DIAGNOSIS — C8595 Non-Hodgkin lymphoma, unspecified, lymph nodes of inguinal region and lower limb: Secondary | ICD-10-CM

## 2012-06-08 DIAGNOSIS — D472 Monoclonal gammopathy: Secondary | ICD-10-CM

## 2012-06-08 DIAGNOSIS — D61818 Other pancytopenia: Secondary | ICD-10-CM

## 2012-06-08 DIAGNOSIS — C8299 Follicular lymphoma, unspecified, extranodal and solid organ sites: Secondary | ICD-10-CM

## 2012-06-08 LAB — CBC WITH DIFFERENTIAL/PLATELET
Basophils Absolute: 0 10*3/uL (ref 0.0–0.1)
EOS%: 1.7 % (ref 0.0–7.0)
HGB: 8.7 g/dL — ABNORMAL LOW (ref 13.0–17.1)
MCH: 37.2 pg — ABNORMAL HIGH (ref 27.2–33.4)
MCV: 108.5 fL — ABNORMAL HIGH (ref 79.3–98.0)
MONO%: 1.8 % (ref 0.0–14.0)
RDW: 16.9 % — ABNORMAL HIGH (ref 11.0–14.6)

## 2012-06-08 LAB — COMPREHENSIVE METABOLIC PANEL (CC13)
AST: 16 U/L (ref 5–34)
Albumin: 3.8 g/dL (ref 3.5–5.0)
Alkaline Phosphatase: 40 U/L (ref 40–150)
BUN: 14.9 mg/dL (ref 7.0–26.0)
Creatinine: 0.9 mg/dL (ref 0.7–1.3)
Potassium: 4 mEq/L (ref 3.5–5.1)
Total Bilirubin: 0.72 mg/dL (ref 0.20–1.20)

## 2012-06-08 MED ORDER — DARBEPOETIN ALFA-POLYSORBATE 300 MCG/0.6ML IJ SOLN
300.0000 ug | Freq: Once | INTRAMUSCULAR | Status: AC
  Administered 2012-06-08: 300 ug via SUBCUTANEOUS
  Filled 2012-06-08: qty 0.6

## 2012-06-08 NOTE — Progress Notes (Signed)
Hematology and Oncology Follow Up Visit  [name] [id number] 07/21/1932 79 y.o. 06/08/2012 1:38 PM  CC: [name]. Victor Sires, MD, FCCP    Principle Diagnosis: A 79 year old gentleman with the following diagnoses:   1. Follicular lymphoma as a grade 3, stage III, diagnosed 2007, continued to be in remission. 2. Pancytopenia associated with mild neutropenia and macrocytosis possibly indicating early myelodysplasia. 3. Monoclonal gammopathy of undetermined significance.  Prior Therapy:  1. Status post CHOP and rituximab.  He had complete response to therapy concluded in 2007.  No further imaging has been indicated at this time. 2. Status post bone marrow biopsy in April 2011, did not really show any evidence of myelodysplasia or metastatic lymphoma at this time.  Current therapy: Aranesp 300 mcg every 3 weeks to keep his hemoglobin close to 11.  Interim History: [name] today for a followup visit.  He has continued to really do very well and have no major issues since the last time I saw him.  He had not had any hospitalization, had not had any illnesses.  Had not reported any evidence of bleeding.  Had not had any infection. His activity level is about the same.  His energy is reasonable and had not had any other complications to report.  He does not report any dysphagia.  He does not report any lymphadenopathy. No night sweats. Overall performance status, activity level remains stable and certainly it was helped by Aransep. He feels more fatigued at the end of the three week interval.    Medications: I have reviewed the patient's current medications. Current outpatient prescriptions:acetaminophen (TYLENOL) 500 MG tablet, 1 to 2 tabs every 4 to 6 hours as needed , Disp: , Rfl: ;  aspirin 81 MG tablet, Take 81 mg by mouth daily.  , Disp: , Rfl: ;  calcium-vitamin D (OS-CAL 500 + D) 500-200 MG-UNIT per tablet, Take 1 tablet by mouth 2 (two) times daily.  , Disp: , Rfl: ;   Cholecalciferol (VITAMIN D) 1000 UNITS capsule, Take 1,000 Units by mouth 2 (two) times daily.  , Disp: , Rfl:  dextromethorphan-guaiFENesin (MUCINEX DM) 30-600 MG per 12 hr tablet, Take 1 tablet by mouth every 12 (twelve) hours as needed., Disp: , Rfl: ;  docusate sodium (STOOL SOFTENER) 100 MG capsule, as directed. , Disp: , Rfl: ;  escitalopram (LEXAPRO) 20 MG tablet, TAKE 1/2 TABLET BY MOUTH EVERY DAY, Disp: 15 tablet, Rfl: 11;  famotidine (PEPCID) 20 MG tablet, Take 20 mg by mouth at bedtime.  , Disp: , Rfl:  finasteride (PROSCAR) 5 MG tablet, Take 5 mg by mouth daily.  , Disp: , Rfl: ;  furosemide (LASIX) 20 MG tablet, Take 20 mg by mouth daily. And 1 extra as needed for fluid retension, Disp: , Rfl: ;  hydrocortisone cream 1 %, Apply 1 application topically 2 (two) times daily as needed., Disp: , Rfl: ;  KLOR-CON M20 20 MEQ tablet, TAKE 1 TABLET DAILY, Disp: 34 tablet, Rfl: 11 metFORMIN (GLUCOPHAGE) 500 MG tablet, 1/2 tab qam and 1/2 tab pm, Disp: , Rfl: ;  methylcellulose (CITRUCEL) oral powder, 1 scoop in 8oz of water once daily, Disp: , Rfl: ;  mometasone (NASONEX) 50 MCG/ACT nasal spray, 0-2 puffs in the morning and 1-2 puffs at bedtime, Disp: , Rfl: ;  Multiple Vitamin (MULTIVITAMIN) tablet, Take 1 tablet by mouth daily.  , Disp: , Rfl:  omeprazole (PRILOSEC) 20 MG capsule, Take 40 mg by mouth daily with breakfast. , Disp: ,  Rfl: ;  oxybutynin (DITROPAN) 5 MG tablet, Take 5 mg by mouth daily., Disp: , Rfl: ;  oxymetazoline (AFRIN) 0.05 % nasal spray, 2 puffs every 12 hours needed for stuffy nose x 5 days and stop, Disp: , Rfl: ;  polyethylene glycol powder (MIRALAX) powder, Take 17 g by mouth daily. , Disp: , Rfl:  predniSONE (STERAPRED UNI-PAK) 10 MG tablet, Prednisone 10 mg take  4 each am x 2 days,   2 each am x 2 days,  1 each am x2days and stop, Disp: 14 tablet, Rfl: 0;  PROAIR HFA 108 (90 BASE) MCG/ACT inhaler, INHALE 1 TO 2 PUFFS BY MOUTH EVERY 4 TO 6 HOURS AS NEEDED, Disp: 1 Inhaler, Rfl:  11;  sulindac (CLINORIL) 200 MG tablet, Take 1 tablet (200 mg total) by mouth 2 (two) times daily., Disp: 60 tablet, Rfl: 1 traMADol (ULTRAM) 50 MG tablet, One every 4 hours if severe cough or pain, Disp: 40 tablet, Rfl: 0;  traZODone (DESYREL) 150 MG tablet, TAKE 1/3 TABLET BY MOUTH AT BEDTIME, Disp: 30 tablet, Rfl: 11  Allergies: No Known Allergies  Past Medical History, Surgical history, Social history, and Family History were reviewed and updated.  Review of Systems: Constitutional:  Negative for fever, chills, night sweats, anorexia, weight loss, pain. Cardiovascular: no chest pain or dyspnea on exertion Respiratory: no cough, shortness of breath, or wheezing Neurological: no TIA or stroke symptoms Dermatological: negative ENT: negative Skin: Negative. Gastrointestinal: no abdominal pain, change in bowel habits, or black or bloody stools Genito-Urinary: no dysuria, trouble voiding, or hematuria Hematological and Lymphatic: negative Breast: negative Musculoskeletal: negative Remaining ROS negative.  Physical Exam: Blood pressure 122/75, pulse 85, temperature 97.5 F (36.4 C), temperature source Oral, resp. rate 18, height 5\' 11"  (1.803 m), weight 254 lb 9.6 oz (115.486 kg). ECOG: 1 General appearance: alert Head: Normocephalic, without obvious abnormality, atraumatic Neck: no adenopathy, no carotid bruit, no JVD, supple, symmetrical, trachea midline and thyroid not enlarged, symmetric, no tenderness/mass/nodules Lymph nodes: Cervical, supraclavicular, and axillary nodes normal. Heart:regular rate and rhythm, S1, S2 normal, no murmur, click, rub or gallop Lung:chest clear, no wheezing, rales, normal symmetric air entry Abdomen: soft, non-tender, without masses or organomegaly EXT:no erythema, induration, or nodules  Lab Results: Lab Results  Component Value Date   WBC 1.7* 06/08/2012   HGB 8.7* 06/08/2012   HCT 25.4* 06/08/2012   MCV 108.5* 06/08/2012   PLT 113* 06/08/2012      Chemistry      Component Value Date/Time   NA 135 02/15/2012 1356   NA 137 12/22/2011 1335   K 3.7 02/15/2012 1356   K 3.9 12/22/2011 1335   CL 96 02/15/2012 1356   CL 98 12/22/2011 1335   CO2 31 02/15/2012 1356   CO2 31* 12/22/2011 1335   BUN 16 02/15/2012 1356   BUN 13.0 12/22/2011 1335   CREATININE 0.9 02/15/2012 1356   CREATININE 1.0 12/22/2011 1335      Component Value Date/Time   CALCIUM 9.5 02/15/2012 1356   CALCIUM 9.4 12/22/2011 1335   ALKPHOS 36* 12/22/2011 1335   ALKPHOS 33* 07/29/2011 1308   AST 17 12/22/2011 1335   AST 17 07/29/2011 1308   ALT 18 12/22/2011 1335   ALT 17 07/29/2011 1308   BILITOT 0.90 12/22/2011 1335   BILITOT 0.5 07/29/2011 1308      Impression and Plan:  This is a 79 year old gentleman with the following issues:  1. Follicular lymphoma diagnosed in 2007.  He has no  evidence to suggest recurrent disease. 2. Pancytopenia of unclear etiology, probably myelodysplastic syndrome is the most likely etiology at this time.  His vitamin B12 and folate have been in the normal range.  He had a bone marrow biopsy in April of 2011; however,  his counts continue to decrease, which they have been stable recently, then certainly a repeat bone marrow biopsy would be indicated. I discussed the risks and benefits of repeating bone marrow at this time and he is agreeable to have it done. I will evaluate him for MDS and send a sample for 5q deletion. This might help in determining if he would benefit form low dose Revlimid.  3. Anemia.  He is to continue on Aranesp 300 mcg every three weeks to keep hemoglobin above 11. He will receive Aranesp today. 4. Monoclonal gammopathy.  His M-spike had been less than 1 g/dL, is unchanged. Continue to monitor that every 6 months.   5. Followup will be set up in about 3 months.  SHADAD,FIRAS 5/14/20141:38 PM

## 2012-06-10 LAB — PROTEIN ELECTROPHORESIS, SERUM
Albumin ELP: 61 % (ref 55.8–66.1)
Alpha-1-Globulin: 5.3 % — ABNORMAL HIGH (ref 2.9–4.9)
Alpha-2-Globulin: 9.8 % (ref 7.1–11.8)
Total Protein, Serum Electrophoresis: 6.8 g/dL (ref 6.0–8.3)

## 2012-06-10 LAB — KAPPA/LAMBDA LIGHT CHAINS: Kappa:Lambda Ratio: 0.07 — ABNORMAL LOW (ref 0.26–1.65)

## 2012-06-13 ENCOUNTER — Telehealth: Payer: Self-pay | Admitting: *Deleted

## 2012-06-13 ENCOUNTER — Other Ambulatory Visit: Payer: Self-pay | Admitting: Radiology

## 2012-06-13 NOTE — Telephone Encounter (Signed)
Spoke with stephanie cox, daughter, schedulers were trying to reach patient for bx tomorrow. They had just gotten in touch with stephanie.

## 2012-06-14 ENCOUNTER — Ambulatory Visit (HOSPITAL_COMMUNITY): Admission: RE | Admit: 2012-06-14 | Payer: Medicare Other | Source: Ambulatory Visit

## 2012-06-14 ENCOUNTER — Other Ambulatory Visit (HOSPITAL_COMMUNITY): Payer: Medicare Other

## 2012-06-16 ENCOUNTER — Other Ambulatory Visit: Payer: Self-pay | Admitting: Radiology

## 2012-06-17 ENCOUNTER — Encounter (HOSPITAL_COMMUNITY): Payer: Self-pay | Admitting: Pharmacy Technician

## 2012-06-22 ENCOUNTER — Ambulatory Visit (HOSPITAL_COMMUNITY)
Admission: RE | Admit: 2012-06-22 | Discharge: 2012-06-22 | Disposition: A | Discharge status: Home / Self Care | Payer: Medicare Other | Source: Ambulatory Visit | Attending: Oncology | Admitting: Oncology

## 2012-06-22 ENCOUNTER — Encounter (HOSPITAL_COMMUNITY): Payer: Self-pay

## 2012-06-22 DIAGNOSIS — D709 Neutropenia, unspecified: Secondary | ICD-10-CM | POA: Insufficient documentation

## 2012-06-22 DIAGNOSIS — Z950 Presence of cardiac pacemaker: Secondary | ICD-10-CM | POA: Insufficient documentation

## 2012-06-22 DIAGNOSIS — D472 Monoclonal gammopathy: Secondary | ICD-10-CM | POA: Insufficient documentation

## 2012-06-22 DIAGNOSIS — R0609 Other forms of dyspnea: Secondary | ICD-10-CM | POA: Insufficient documentation

## 2012-06-22 DIAGNOSIS — C8595 Non-Hodgkin lymphoma, unspecified, lymph nodes of inguinal region and lower limb: Secondary | ICD-10-CM

## 2012-06-22 DIAGNOSIS — R0989 Other specified symptoms and signs involving the circulatory and respiratory systems: Secondary | ICD-10-CM | POA: Insufficient documentation

## 2012-06-22 DIAGNOSIS — D649 Anemia, unspecified: Secondary | ICD-10-CM

## 2012-06-22 DIAGNOSIS — C8589 Other specified types of non-Hodgkin lymphoma, extranodal and solid organ sites: Secondary | ICD-10-CM | POA: Insufficient documentation

## 2012-06-22 DIAGNOSIS — Z79899 Other long term (current) drug therapy: Secondary | ICD-10-CM | POA: Insufficient documentation

## 2012-06-22 DIAGNOSIS — D61818 Other pancytopenia: Secondary | ICD-10-CM | POA: Insufficient documentation

## 2012-06-22 DIAGNOSIS — E119 Type 2 diabetes mellitus without complications: Secondary | ICD-10-CM | POA: Insufficient documentation

## 2012-06-22 LAB — CBC
MCH: 35.5 pg — ABNORMAL HIGH (ref 26.0–34.0)
MCHC: 33.7 g/dL (ref 30.0–36.0)
Platelets: 122 10*3/uL — ABNORMAL LOW (ref 150–400)
RBC: 2.48 MIL/uL — ABNORMAL LOW (ref 4.22–5.81)

## 2012-06-22 LAB — PROTIME-INR
INR: 1.13 (ref 0.00–1.49)
Prothrombin Time: 14.3 seconds (ref 11.6–15.2)

## 2012-06-22 LAB — BONE MARROW EXAM: Bone Marrow Exam: 338

## 2012-06-22 LAB — APTT: aPTT: 28 seconds (ref 24–37)

## 2012-06-22 MED ORDER — MIDAZOLAM HCL 2 MG/2ML IJ SOLN
INTRAMUSCULAR | Status: AC
  Filled 2012-06-22: qty 6

## 2012-06-22 MED ORDER — SODIUM CHLORIDE 0.9 % IV SOLN
INTRAVENOUS | Status: DC
  Administered 2012-06-22: 10:00:00 via INTRAVENOUS

## 2012-06-22 MED ORDER — FENTANYL CITRATE 0.05 MG/ML IJ SOLN
INTRAMUSCULAR | Status: AC
  Filled 2012-06-22: qty 6

## 2012-06-22 MED ORDER — FENTANYL CITRATE 0.05 MG/ML IJ SOLN
INTRAMUSCULAR | Status: AC | PRN
  Administered 2012-06-22: 100 ug via INTRAVENOUS

## 2012-06-22 MED ORDER — HYDROCODONE-ACETAMINOPHEN 5-325 MG PO TABS
1.0000 | ORAL_TABLET | ORAL | Status: DC | PRN
  Filled 2012-06-22: qty 2

## 2012-06-22 MED ORDER — MIDAZOLAM HCL 2 MG/2ML IJ SOLN
INTRAMUSCULAR | Status: AC | PRN
  Administered 2012-06-22: 2 mg via INTRAVENOUS

## 2012-06-22 NOTE — H&P (Signed)
[name] is an 79 y.o. male.   Chief Complaint: "I'm having a bone marrow biopsy" HPI: Patient with history of follicular lymphoma (currently in remission), MGUS, anemia and pancytopenia/neutropenia presents today for CT guided bone marrow biopsy.  Past Medical History  Diagnosis Date  . Dyspnea   . 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   . Allergic rhinitis   . Hyperlipidemia   . Exogenous obesity   . Edema     venous insufficiency  . Diabetes mellitus 2012    T2DM  . Second degree Mobitz II AV block 12/11/08    with transient syncope, resolved s/p PPM  . Paroxysmal atrial fibrillation 03/16/11    diagnosed by PPM interrogation  . Pancytopenia   . Postural dizziness     Past Surgical History  Procedure Laterality Date  . Lymph node biopsy      groin  . Sternotomy  2000  . Pericardiectomy  2000  . Penile prosthesis implant      and removal  . Pacemaker insertion  12/11/08    MDT implanted by Dr Johney Frame    Family History  Problem Relation Age of Onset  . Diabetes Mother   . Liver cancer Brother   . Cancer Sister    Social History:  reports that he quit smoking about 44 years ago. His smoking use included Cigarettes. He has a 30 pack-year smoking history. He has quit using smokeless tobacco. He reports that he does not drink alcohol or use illicit drugs.  Allergies: No Known Allergies  Current outpatient prescriptions:acetaminophen (TYLENOL) 500 MG tablet, Take 500-1,000 mg by mouth every 4 (four) hours as needed for pain. , Disp: , Rfl: ;  albuterol (PROVENTIL HFA;VENTOLIN HFA) 108 (90 BASE) MCG/ACT inhaler, Inhale 2 puffs into the lungs every 6 (six) hours as needed for wheezing., Disp: , Rfl: ;  aspirin 81 MG tablet, Take 81 mg by mouth every morning. , Disp: , Rfl:  calcium-vitamin D (OS-CAL 500 + D) 500-200 MG-UNIT per tablet, Take 1 tablet by mouth 2 (two) times daily.  , Disp: , Rfl: ;  Cholecalciferol  (VITAMIN D) 1000 UNITS capsule, Take 1,000 Units by mouth 2 (two) times daily.  , Disp: , Rfl: ;  dextromethorphan-guaiFENesin (MUCINEX DM) 30-600 MG per 12 hr tablet, Take 1 tablet by mouth every 12 (twelve) hours as needed (cough). , Disp: , Rfl:  docusate sodium (STOOL SOFTENER) 100 MG capsule, Take 100 mg by mouth daily as needed for constipation. , Disp: , Rfl: ;  escitalopram (LEXAPRO) 20 MG tablet, Take 10 mg by mouth at bedtime., Disp: , Rfl: ;  famotidine (PEPCID) 20 MG tablet, Take 20 mg by mouth at bedtime. , Disp: , Rfl: ;  finasteride (PROSCAR) 5 MG tablet, Take 5 mg by mouth every morning. , Disp: , Rfl:  furosemide (LASIX) 20 MG tablet, Take 20 mg by mouth daily. And 1 extra as needed for fluid retension, Disp: , Rfl: ;  hydrocortisone cream 1 %, Apply 1 application topically 2 (two) times daily as needed (itchy rash). , Disp: , Rfl: ;  metFORMIN (GLUCOPHAGE) 500 MG tablet, Take 250 mg by mouth 2 (two) times daily with a meal. , Disp: , Rfl:  methylcellulose (CITRUCEL) oral powder, Take 1 packet by mouth daily. 1 scoop in 8oz of water once daily, Disp: , Rfl: ;  mometasone (NASONEX) 50 MCG/ACT nasal spray, Place 1-2 sprays into the nose  at bedtime., Disp: , Rfl: ;  Multiple Vitamin (MULTIVITAMIN) tablet, Take 1 tablet by mouth daily.  , Disp: , Rfl: ;  omeprazole (PRILOSEC) 20 MG capsule, Take 40 mg by mouth daily with breakfast. , Disp: , Rfl:  oxybutynin (DITROPAN) 5 MG tablet, Take 5 mg by mouth daily., Disp: , Rfl: ;  oxymetazoline (AFRIN) 0.05 % nasal spray, Place 2 sprays into the nose 2 (two) times daily. 2 puffs every 12 hours needed for stuffy nose x 5 days and stop, Disp: , Rfl: ;  polyethylene glycol powder (MIRALAX) powder, Take 17 g by mouth daily. , Disp: , Rfl: ;  potassium chloride SA (K-DUR,KLOR-CON) 20 MEQ tablet, Take 20 mEq by mouth 2 (two) times daily., Disp: , Rfl:  sulindac (CLINORIL) 200 MG tablet, Take 200 mg by mouth 2 (two) times daily as needed (pain)., Disp: , Rfl: ;   traZODone (DESYREL) 150 MG tablet, Take 37.5 mg by mouth at bedtime., Disp: , Rfl: ;  traMADol (ULTRAM) 50 MG tablet, Take 50 mg by mouth every 4 (four) hours as needed for pain., Disp: , Rfl:  Current facility-administered medications:0.9 %  sodium chloride infusion, , Intravenous, Continuous, D Jeananne Rama, PA-C, Last Rate: 20 mL/hr at 06/22/12 0954   Results for orders placed during the hospital encounter of 06/22/12 (from the past 48 hour(s))  APTT     Status: None   Collection Time    06/22/12  9:30 AM      Result Value Range   aPTT 28  24 - 37 seconds  CBC     Status: Abnormal   Collection Time    06/22/12  9:30 AM      Result Value Range   WBC 1.5 (*) 4.0 - 10.5 K/uL   RBC 2.48 (*) 4.22 - 5.81 MIL/uL   Hemoglobin 8.8 (*) 13.0 - 17.0 g/dL   HCT 45.4 (*) 09.8 - 11.9 %   MCV 105.2 (*) 78.0 - 100.0 fL   MCH 35.5 (*) 26.0 - 34.0 pg   MCHC 33.7  30.0 - 36.0 g/dL   RDW 14.7  82.9 - 56.2 %   Platelets 122 (*) 150 - 400 K/uL   Comment: REPEATED TO VERIFY  PROTIME-INR     Status: None   Collection Time    06/22/12  9:30 AM      Result Value Range   Prothrombin Time 14.3  11.6 - 15.2 seconds   INR 1.13  0.00 - 1.49   No results found.  Review of Systems  Constitutional: Negative for fever and chills.  Respiratory:       Occ cough, dyspnea with exertion  Cardiovascular:       Occ chest discomfort  Gastrointestinal: Negative for nausea, vomiting and abdominal pain.  Musculoskeletal: Negative for back pain.  Neurological: Positive for headaches.  Endo/Heme/Allergies: Does not bruise/bleed easily.    Blood pressure 149/89, pulse 86, temperature 97 F (36.1 C), temperature source Oral, resp. rate 20, height 5\' 11"  (1.803 m), weight 256 lb (116.121 kg), SpO2 94.00%. Physical Exam  Constitutional: He is oriented to person, place, and time. He appears well-developed and well-nourished.  Cardiovascular: Normal rate and regular rhythm.   Respiratory: Effort normal and breath  sounds normal.  GI: Soft. Bowel sounds are normal. There is no tenderness.  obese  Musculoskeletal: Normal range of motion. He exhibits edema.  Neurological: He is alert and oriented to person, place, and time.     Assessment/Plan Pt with hx follicular  lymphoma(in remission), anemia, MGUS and pancytopenia/neutropenia. Plan is for CT guided bone marrow biopsy today. Details/risks of procedure d/w pt with his understanding and consent.  ALLRED,D KEVIN 06/22/2012, 10:04 AM

## 2012-06-22 NOTE — Procedures (Signed)
CT bone marrow  No complication No blood loss. See complete dictation in [facility].

## 2012-06-24 ENCOUNTER — Other Ambulatory Visit: Payer: Self-pay | Admitting: Internal Medicine

## 2012-06-29 ENCOUNTER — Other Ambulatory Visit (HOSPITAL_BASED_OUTPATIENT_CLINIC_OR_DEPARTMENT_OTHER): Payer: Medicare Other | Admitting: Lab

## 2012-06-29 ENCOUNTER — Other Ambulatory Visit: Payer: Medicare Other | Admitting: Lab

## 2012-06-29 ENCOUNTER — Ambulatory Visit (HOSPITAL_BASED_OUTPATIENT_CLINIC_OR_DEPARTMENT_OTHER): Payer: Medicare Other

## 2012-06-29 ENCOUNTER — Ambulatory Visit: Payer: Medicare Other

## 2012-06-29 VITALS — BP 135/81 | HR 90 | Temp 98.5°F

## 2012-06-29 DIAGNOSIS — D649 Anemia, unspecified: Secondary | ICD-10-CM

## 2012-06-29 DIAGNOSIS — C8595 Non-Hodgkin lymphoma, unspecified, lymph nodes of inguinal region and lower limb: Secondary | ICD-10-CM

## 2012-06-29 LAB — CBC WITH DIFFERENTIAL/PLATELET
Basophils Absolute: 0 10*3/uL (ref 0.0–0.1)
Eosinophils Absolute: 0 10*3/uL (ref 0.0–0.5)
HGB: 8.9 g/dL — ABNORMAL LOW (ref 13.0–17.1)
LYMPH%: 58.3 % — ABNORMAL HIGH (ref 14.0–49.0)
MCH: 38 pg — ABNORMAL HIGH (ref 27.2–33.4)
MCV: 108.2 fL — ABNORMAL HIGH (ref 79.3–98.0)
MONO%: 2 % (ref 0.0–14.0)
NEUT#: 0.5 10*3/uL — CL (ref 1.5–6.5)
Platelets: 114 10*3/uL — ABNORMAL LOW (ref 140–400)

## 2012-06-29 MED ORDER — DARBEPOETIN ALFA-POLYSORBATE 300 MCG/0.6ML IJ SOLN
300.0000 ug | Freq: Once | INTRAMUSCULAR | Status: AC
  Administered 2012-06-29: 300 ug via SUBCUTANEOUS
  Filled 2012-06-29: qty 0.6

## 2012-07-12 ENCOUNTER — Other Ambulatory Visit: Payer: Self-pay | Admitting: Internal Medicine

## 2012-07-15 ENCOUNTER — Other Ambulatory Visit: Payer: Self-pay | Admitting: Internal Medicine

## 2012-07-20 ENCOUNTER — Other Ambulatory Visit (HOSPITAL_BASED_OUTPATIENT_CLINIC_OR_DEPARTMENT_OTHER): Payer: Medicare Other | Admitting: Lab

## 2012-07-20 ENCOUNTER — Ambulatory Visit (HOSPITAL_BASED_OUTPATIENT_CLINIC_OR_DEPARTMENT_OTHER): Payer: Medicare Other

## 2012-07-20 VITALS — BP 139/66 | HR 86 | Temp 98.4°F

## 2012-07-20 DIAGNOSIS — D649 Anemia, unspecified: Secondary | ICD-10-CM

## 2012-07-20 DIAGNOSIS — C8299 Follicular lymphoma, unspecified, extranodal and solid organ sites: Secondary | ICD-10-CM

## 2012-07-20 DIAGNOSIS — C8595 Non-Hodgkin lymphoma, unspecified, lymph nodes of inguinal region and lower limb: Secondary | ICD-10-CM

## 2012-07-20 LAB — CBC WITH DIFFERENTIAL/PLATELET
BASO%: 0.6 % (ref 0.0–2.0)
EOS%: 1.7 % (ref 0.0–7.0)
LYMPH%: 60.1 % — ABNORMAL HIGH (ref 14.0–49.0)
MCH: 35.8 pg — ABNORMAL HIGH (ref 27.2–33.4)
MCHC: 33.5 g/dL (ref 32.0–36.0)
MCV: 107.1 fL — ABNORMAL HIGH (ref 79.3–98.0)
MONO%: 2.3 % (ref 0.0–14.0)
Platelets: 101 10*3/uL — ABNORMAL LOW (ref 140–400)
RBC: 2.4 10*6/uL — ABNORMAL LOW (ref 4.20–5.82)

## 2012-07-20 MED ORDER — DARBEPOETIN ALFA-POLYSORBATE 300 MCG/0.6ML IJ SOLN
300.0000 ug | Freq: Once | INTRAMUSCULAR | Status: AC
  Administered 2012-07-20: 300 ug via SUBCUTANEOUS
  Filled 2012-07-20: qty 0.6

## 2012-07-20 NOTE — Patient Instructions (Addendum)
Darbepoetin Alfa injection What is this medicine? DARBEPOETIN ALFA (dar be POE e tin AL fa) helps your body make more red blood cells. It is used to treat anemia caused by chronic kidney failure and chemotherapy. This medicine may be used for other purposes; ask your health care provider or pharmacist if you have questions. What should I tell my health care provider before I take this medicine? They need to know if you have any of these conditions: -blood clotting disorders or history of blood clots -cancer patient not on chemotherapy -cystic fibrosis -heart disease, such as angina, heart failure, or a history of a heart attack -hemoglobin level of 12 g/dL or greater -high blood pressure -low levels of folate, iron, or vitamin B12 -seizures -an unusual or allergic reaction to darbepoetin, erythropoietin, albumin, hamster proteins, latex, other medicines, foods, dyes, or preservatives -pregnant or trying to get pregnant -breast-feeding How should I use this medicine? This medicine is for injection into a vein or under the skin. It is usually given by a health care professional in a hospital or clinic setting. If you get this medicine at home, you will be taught how to prepare and give this medicine. Do not shake the solution before you withdraw a dose. Use exactly as directed. Take your medicine at regular intervals. Do not take your medicine more often than directed. It is important that you put your used needles and syringes in a special sharps container. Do not put them in a trash can. If you do not have a sharps container, call your pharmacist or healthcare provider to get one. Talk to your pediatrician regarding the use of this medicine in children. While this medicine may be used in children as young as 1 year for selected conditions, precautions do apply. Overdosage: If you think you have taken too much of this medicine contact a poison control center or emergency room at once. NOTE:  This medicine is only for you. Do not share this medicine with others. What if I miss a dose? If you miss a dose, take it as soon as you can. If it is almost time for your next dose, take only that dose. Do not take double or extra doses. What may interact with this medicine? Do not take this medicine with any of the following medications: -epoetin alfa This list may not describe all possible interactions. Give your health care provider a list of all the medicines, herbs, non-prescription drugs, or dietary supplements you use. Also tell them if you smoke, drink alcohol, or use illegal drugs. Some items may interact with your medicine. What should I watch for while using this medicine? Visit your prescriber or health care professional for regular checks on your progress and for the needed blood tests and blood pressure measurements. It is especially important for the doctor to make sure your hemoglobin level is in the desired range, to limit the risk of potential side effects and to give you the best benefit. Keep all appointments for any recommended tests. Check your blood pressure as directed. Ask your doctor what your blood pressure should be and when you should contact him or her. As your body makes more red blood cells, you may need to take iron, folic acid, or vitamin B supplements. Ask your doctor or health care provider which products are right for you. If you have kidney disease continue dietary restrictions, even though this medication can make you feel better. Talk with your doctor or health care professional about the   foods you eat and the vitamins that you take. What side effects may I notice from receiving this medicine? Side effects that you should report to your doctor or health care professional as soon as possible: -allergic reactions like skin rash, itching or hives, swelling of the face, lips, or tongue -breathing problems -changes in vision -chest pain -confusion, trouble speaking  or understanding -feeling faint or lightheaded, falls -high blood pressure -muscle aches or pains -pain, swelling, warmth in the leg -rapid weight gain -severe headaches -sudden numbness or weakness of the face, arm or leg -trouble walking, dizziness, loss of balance or coordination -seizures (convulsions) -swelling of the ankles, feet, hands -unusually weak or tired Side effects that usually do not require medical attention (report to your doctor or health care professional if they continue or are bothersome): -diarrhea -fever, chills (flu-like symptoms) -headaches -nausea, vomiting -redness, stinging, or swelling at site where injected This list may not describe all possible side effects. Call your doctor for medical advice about side effects. You may report side effects to FDA at 1-800-FDA-1088. Where should I keep my medicine? Keep out of the reach of children. Store in a refrigerator between 2 and 8 degrees C (36 and 46 degrees F). Do not freeze. Do not shake. Throw away any unused portion if using a single-dose vial. Throw away any unused medicine after the expiration date. NOTE: This sheet is a summary. It may not cover all possible information. If you have questions about this medicine, talk to your doctor, pharmacist, or health care provider.  2013, [employer]. (12/27/2007 10:23:57 AM)  

## 2012-08-08 ENCOUNTER — Encounter: Payer: Self-pay | Admitting: Internal Medicine

## 2012-08-10 ENCOUNTER — Other Ambulatory Visit (HOSPITAL_BASED_OUTPATIENT_CLINIC_OR_DEPARTMENT_OTHER): Payer: Medicare Other | Admitting: Lab

## 2012-08-10 ENCOUNTER — Ambulatory Visit (HOSPITAL_BASED_OUTPATIENT_CLINIC_OR_DEPARTMENT_OTHER): Payer: Medicare Other

## 2012-08-10 VITALS — BP 155/87 | HR 90 | Temp 98.1°F

## 2012-08-10 DIAGNOSIS — D649 Anemia, unspecified: Secondary | ICD-10-CM

## 2012-08-10 DIAGNOSIS — C8595 Non-Hodgkin lymphoma, unspecified, lymph nodes of inguinal region and lower limb: Secondary | ICD-10-CM

## 2012-08-10 LAB — CBC WITH DIFFERENTIAL/PLATELET
BASO%: 0.6 % (ref 0.0–2.0)
HCT: 25.3 % — ABNORMAL LOW (ref 38.4–49.9)
LYMPH%: 54 % — ABNORMAL HIGH (ref 14.0–49.0)
MCH: 38.1 pg — ABNORMAL HIGH (ref 27.2–33.4)
MCHC: 34.9 g/dL (ref 32.0–36.0)
MCV: 109.1 fL — ABNORMAL HIGH (ref 79.3–98.0)
MONO%: 2.1 % (ref 0.0–14.0)
NEUT%: 41.3 % (ref 39.0–75.0)
Platelets: 109 10*3/uL — ABNORMAL LOW (ref 140–400)
RBC: 2.32 10*6/uL — ABNORMAL LOW (ref 4.20–5.82)

## 2012-08-10 LAB — COMPREHENSIVE METABOLIC PANEL (CC13)
ALT: 14 U/L (ref 0–55)
Alkaline Phosphatase: 48 U/L (ref 40–150)
CO2: 27 mEq/L (ref 22–29)
Creatinine: 0.9 mg/dL (ref 0.7–1.3)
Sodium: 132 mEq/L — ABNORMAL LOW (ref 136–145)
Total Bilirubin: 0.82 mg/dL (ref 0.20–1.20)
Total Protein: 7.3 g/dL (ref 6.4–8.3)

## 2012-08-10 MED ORDER — DARBEPOETIN ALFA-POLYSORBATE 300 MCG/0.6ML IJ SOLN
300.0000 ug | Freq: Once | INTRAMUSCULAR | Status: AC
  Administered 2012-08-10: 300 ug via SUBCUTANEOUS
  Filled 2012-08-10: qty 0.6

## 2012-08-12 LAB — SPEP & IFE WITH QIG
Albumin ELP: 60.5 % (ref 55.8–66.1)
Alpha-2-Globulin: 10 % (ref 7.1–11.8)
Beta Globulin: 6.4 % (ref 4.7–7.2)
IgA: 48 mg/dL — ABNORMAL LOW (ref 68–379)
IgG (Immunoglobin G), Serum: 1200 mg/dL (ref 650–1600)
M-Spike, %: 0.81 g/dL
Total Protein, Serum Electrophoresis: 7 g/dL (ref 6.0–8.3)

## 2012-08-19 ENCOUNTER — Other Ambulatory Visit (INDEPENDENT_AMBULATORY_CARE_PROVIDER_SITE_OTHER): Payer: Medicare Other

## 2012-08-19 ENCOUNTER — Ambulatory Visit (INDEPENDENT_AMBULATORY_CARE_PROVIDER_SITE_OTHER): Payer: Medicare Other | Admitting: Internal Medicine

## 2012-08-19 ENCOUNTER — Encounter: Payer: Self-pay | Admitting: Gastroenterology

## 2012-08-19 ENCOUNTER — Encounter: Payer: Self-pay | Admitting: Internal Medicine

## 2012-08-19 ENCOUNTER — Ambulatory Visit (INDEPENDENT_AMBULATORY_CARE_PROVIDER_SITE_OTHER)
Admission: RE | Admit: 2012-08-19 | Discharge: 2012-08-19 | Disposition: A | Discharge status: Home / Self Care | Payer: Medicare Other | Source: Ambulatory Visit | Attending: Internal Medicine | Admitting: Internal Medicine

## 2012-08-19 VITALS — BP 132/68 | HR 88 | Temp 97.7°F | Ht 71.0 in | Wt 255.0 lb

## 2012-08-19 DIAGNOSIS — J45909 Unspecified asthma, uncomplicated: Secondary | ICD-10-CM

## 2012-08-19 DIAGNOSIS — E1365 Other specified diabetes mellitus with hyperglycemia: Secondary | ICD-10-CM

## 2012-08-19 DIAGNOSIS — R609 Edema, unspecified: Secondary | ICD-10-CM

## 2012-08-19 DIAGNOSIS — IMO0002 Reserved for concepts with insufficient information to code with codable children: Secondary | ICD-10-CM

## 2012-08-19 MED ORDER — MOMETASONE FURO-FORMOTEROL FUM 200-5 MCG/ACT IN AERO: INHALATION_SPRAY | RESPIRATORY_TRACT | Status: DC

## 2012-08-19 NOTE — Patient Instructions (Addendum)
Try dulera 200 Take 2 puffs first thing in am and then another 2 puffs about 12 hours later.    Only use your albuterol (proaire)  as a rescue medication to be used if you can't catch your breath by resting or doing a relaxed purse lip breathing pattern. The less you use it, the better it will work when you need it.   Please remember to go to the lab and x-ray department downstairs for your tests - we will call you with the results when they are available.

## 2012-08-19 NOTE — Assessment & Plan Note (Signed)
-   New onset 2012 while on freq doses of pred for airways dz >>Nutritionist referral 06/20/2010  And started on metfomin 07/02/10  Lab Results  Component Value Date   HGBA1C 7.1* 08/19/2012    Was 6.6 but reluctant to change meds yet > will ask him to work harder on diet and consider increasing glucophage next ov

## 2012-08-19 NOTE — Assessment & Plan Note (Signed)
Response to saba strongly suggests chronic asthma ? Etiology >  The proper method of use, as well as anticipated side effects, of a metered-dose inhaler are discussed and demonstrated to the patient. Improved effectiveness after extensive coaching during this visit to a level of approximately  90% so try dulera 200 2bid and avoid further system steroids for now due to worsening dm

## 2012-08-19 NOTE — Progress Notes (Signed)
Subjective:     Patient ID: [name], male   DOB: 01-28-1932    MRN: [id number]  Brief patient profile:  30 yowm quit smoking 1970 with morbid obesity with boderline DM, Lymphoma s/p chemo ([facility]) and tendency to peripheral edema that is felt to be dependent and related to obesity/ venous insuff.   05/19/2012 f/u ov/Mikael Debell re obesity/ dm Chief Complaint  Patient presents with  . Follow-up    Breathing worse the past couple of wks, relates to pollen in the air.   feels dizzy in head before gets shot every 3 weeks for anemia. Min itching/sneezing with onset of pollen season has not used prns appropriately from med calendar action plan. Also having epistaxis sev times a day rec Prednisone 10 mg take  4 each am x 2 days,   2 each am x 2 days,  1 each am x 2 days and stop  Please see patient coordinator before you leave today  to schedule ent evaluation for your nose bleeds > cauterized, resolved   08/19/2012 f/u ov/Yasaman Kolek re obesity/ dm/ chronic leg swelling Chief Complaint  Patient presents with  . Follow-up    Pt reports breathing is worse, slight wheezing at times but no chest tx. Occasional dry cough   better p shots, better p proaire for a few hours Doe x 50 ft, trouble with bleachers. No assoc cp, diaph or nausea  No obvious daytime variabilty or assoc chronic cough or cp or chest tightness,  overt   hb symptoms. No unusual exp hx   Sleeping ok without nocturnal  or early am exacerbation  of respiratory  c/o's or need for noct saba. Also denies any obvious fluctuation of symptoms with weather or environmental changes or other aggravating or alleviating factors except as outlined above   ROS  The following are not active complaints unless bolded sore throat, dysphagia, dental problems, itching, sneezing,  nasal congestion or excess/ purulent secretions, ear ache,   fever, chills, sweats, unintended wt loss, pleuritic or exertional cp, hemoptysis,  orthopnea pnd or leg swelling,  presyncope, palpitations, heartburn, abdominal pain, anorexia, nausea, vomiting, diarrhea  or change in bowel or urinary habits, change in stools or urine, dysuria,hematuria,  rash, arthralgias, visual complaints, headache, numbness weakness or ataxia or problems with walking or coordination,  change in mood/affect or memory.           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   [facility]  - MRI 11/23/2007 c/w L1-2 bulging disc indents the thecal sac with foraminal stenosis  HEALTH MAINTENANCE..........................................................................................[facility]  - Td 10/2005  - Pneumovax 10/07 second shot  - CPX 08/20/2010  --colon 02/2005 -int/extern. hems (repeat q7y)  Complex med regimen  --Meds reviewed with pt education and computerized med calendar completed/adjusted. November 08, 2008 , August 21, 2009 updated 02/19/2011 , 10/23/2011  Syncope...........................................................................................................[facility]och[facility]  - Mobitz II AV block s/p Medtronic pacemaker placement 12/11/2008  [facility]at[facility].....................................................................................................[facility]  - Pruritic rash 04/2009 > tried lotrisone, referred to  Derm August 06, 2009    Family History:   mother had  diabetes   Social History:  Patient states former smoker.  quit smoking in 1970  No ETOH  Retired            Objective:   Physical Exam wt 244 January 16, 2008 > 249 April 24, 2009 > 249 10/06/2010 >  11/27/2010  245 > 01/07/2011 241 > 02/19/11 238 > 06/04/2011  247 >07/03/2011 246 > 07/31/2011  248 > 09/11/2011  247 >246 10/23/2011 > 258 02/15/2012 > 05/19/2012  258 > 08/19/2012 255   Ambulatory obese wm in no acute distress but seems overall much more frail  HEENT: nl dentition, turbinates, and orophanx.  Mucus membranes pink and moist. No tongue or throat exudate.  Neck: supple with full ROM. no JVD, node enlargement or TMJ   Lungs: clear and equal bilateral breath sounds. No wheezing, rhonchi or rales p taking saba w/in 2 h of ov  Chest: RRR. No murmurs, rubs, or gallops.  1- 2 plus peripheral edema.  Abd: soft and non tender. Normal excursion in the supine position.  Ext warm and dry, no calf tenderness, cyanosis or clubbing. mild/mod chronic venous insufficiency changes. Varicose veins present. Neuro: nl sensorium and gait..    CXR  08/19/2012 :  Cardiomegaly with mild chronic peribronchial thickening. No evidence for focal airspace disease or frank pulmonary edema.     Assessment:

## 2012-08-19 NOTE — Assessment & Plan Note (Signed)
-   chronic dependent, worse on prednisone  Avoid steroids, reviewd use of prn extra dose furosemide and TEDS    Each maintenance medication was reviewed in detail including most importantly the difference between maintenance and as needed and under what circumstances the prns are to be used. This was done in the context of a medication calendar review which provided the patient with a user-friendly unambiguous mechanism for medication administration and reconciliation and provides an action plan for all active problems. It is critical that this be shown to every doctor  for modification during the office visit if necessary so the patient can use it as a working document.

## 2012-08-29 ENCOUNTER — Telehealth: Payer: Self-pay | Admitting: Internal Medicine

## 2012-08-29 NOTE — Telephone Encounter (Signed)
Notes Recorded by Nyoka Cowden, MD on 08/19/2012 at 3:28 PM Call pt: Reviewed cxr and no acute change so no change in recommendations made at ov   I spoke with patient about results and he verbalized understanding and had no questions

## 2012-08-31 ENCOUNTER — Ambulatory Visit (HOSPITAL_BASED_OUTPATIENT_CLINIC_OR_DEPARTMENT_OTHER): Payer: Medicare Other | Admitting: Oncology

## 2012-08-31 ENCOUNTER — Other Ambulatory Visit: Payer: Self-pay

## 2012-08-31 ENCOUNTER — Other Ambulatory Visit (HOSPITAL_BASED_OUTPATIENT_CLINIC_OR_DEPARTMENT_OTHER): Payer: Medicare Other | Admitting: Lab

## 2012-08-31 ENCOUNTER — Telehealth: Payer: Self-pay | Admitting: Oncology

## 2012-08-31 ENCOUNTER — Encounter (HOSPITAL_COMMUNITY)
Admission: RE | Admit: 2012-08-31 | Discharge: 2012-08-31 | Disposition: A | Discharge status: Home / Self Care | Payer: Medicare Other | Source: Ambulatory Visit | Attending: Oncology | Admitting: Oncology

## 2012-08-31 ENCOUNTER — Ambulatory Visit: Payer: Medicare Other | Admitting: Lab

## 2012-08-31 VITALS — BP 143/76 | HR 99 | Temp 97.3°F | Resp 20 | Ht 71.0 in | Wt 266.9 lb

## 2012-08-31 DIAGNOSIS — D472 Monoclonal gammopathy: Secondary | ICD-10-CM

## 2012-08-31 DIAGNOSIS — D649 Anemia, unspecified: Secondary | ICD-10-CM

## 2012-08-31 DIAGNOSIS — C8299 Follicular lymphoma, unspecified, extranodal and solid organ sites: Secondary | ICD-10-CM

## 2012-08-31 DIAGNOSIS — D630 Anemia in neoplastic disease: Secondary | ICD-10-CM

## 2012-08-31 DIAGNOSIS — D61818 Other pancytopenia: Secondary | ICD-10-CM

## 2012-08-31 DIAGNOSIS — C8595 Non-Hodgkin lymphoma, unspecified, lymph nodes of inguinal region and lower limb: Secondary | ICD-10-CM

## 2012-08-31 LAB — CBC WITH DIFFERENTIAL/PLATELET
Basophils Absolute: 0 10*3/uL (ref 0.0–0.1)
EOS%: 2 % (ref 0.0–7.0)
HCT: 23.5 % — ABNORMAL LOW (ref 38.4–49.9)
HGB: 7.8 g/dL — ABNORMAL LOW (ref 13.0–17.1)
LYMPH%: 56.6 % — ABNORMAL HIGH (ref 14.0–49.0)
MCH: 35.8 pg — ABNORMAL HIGH (ref 27.2–33.4)
MCHC: 33.2 g/dL (ref 32.0–36.0)
MCV: 107.8 fL — ABNORMAL HIGH (ref 79.3–98.0)
MONO%: 2.6 % (ref 0.0–14.0)
NEUT%: 38.8 % — ABNORMAL LOW (ref 39.0–75.0)
Platelets: 106 10*3/uL — ABNORMAL LOW (ref 140–400)
lymph#: 0.9 10*3/uL (ref 0.9–3.3)

## 2012-08-31 MED ORDER — DARBEPOETIN ALFA-POLYSORBATE 300 MCG/0.6ML IJ SOLN
300.0000 ug | Freq: Once | INTRAMUSCULAR | Status: AC
  Administered 2012-08-31: 300 ug via SUBCUTANEOUS
  Filled 2012-08-31: qty 0.6

## 2012-08-31 NOTE — Progress Notes (Signed)
Hematology and Oncology Follow Up Visit  [name] [id number] Jan 10, 1933 79 y.o. 08/31/2012 3:00 PM  CC: Casimiro Needle B. [name], MD, FCCP    Principle Diagnosis: A 79 year old gentleman with the following diagnoses:   1. Follicular lymphoma as a grade 3, stage III, diagnosed 2007, continued to be in remission. 2. Pancytopenia associated with mild neutropenia and macrocytosis possibly indicating early myelodysplasia. 3. Monoclonal gammopathy of undetermined significance.  Prior Therapy:  1. Status post CHOP and rituximab.  He had complete response to therapy concluded in 2007.  No further imaging has been indicated at this time. 2. Status post bone marrow biopsy in April 2011, did not really show any evidence of myelodysplasia or metastatic lymphoma at this time.  Current therapy: Aranesp 300 mcg every 3 weeks to keep his hemoglobin close to 11.  Interim History: Mr. Imes presents today for a followup visit.  He is reporting more fatigue and decline in his health since his last visit. He reports no chest pain but DOE.  He had not had any hospitalization, had not had any illnesses.  Had not reported any evidence of bleeding.  Had not had any infection. His activity level is less at this time. He does not report any dysphagia.  He does not report any lymphadenopathy. No night sweats. Overall performance status, activity level is declining.   Medications: I have reviewed the patient's current medications Current Outpatient Prescriptions  Medication Sig Dispense Refill  . acetaminophen (TYLENOL) 500 MG tablet Take 500-1,000 mg by mouth every 4 (four) hours as needed for pain.       [name] albuterol (PROVENTIL HFA;VENTOLIN HFA) 108 (90 BASE) MCG/ACT inhaler Inhale 2 puffs into the lungs every 6 (six) hours as needed for wheezing.      [name] aspirin 81 MG tablet Take 81 mg by mouth every morning.       . calcium-vitamin D (OS-CAL 500 + D) 500-200 MG-UNIT per tablet Take 1 tablet by mouth 2 (two) times  daily.        . Cholecalciferol (VITAMIN D) 1000 UNITS capsule Take 1,000 Units by mouth 2 (two) times daily.        [name] dextromethorphan-guaiFENesin (MUCINEX DM) 30-600 MG per 12 hr tablet Take 1 tablet by mouth every 12 (twelve) hours as needed (cough).       . docusate sodium (STOOL SOFTENER) 100 MG capsule Take 100 mg by mouth daily as needed for constipation.       [name] escitalopram (LEXAPRO) 20 MG tablet Take 10 mg by mouth at bedtime.      . famotidine (PEPCID) 20 MG tablet Take 20 mg by mouth at bedtime.       . finasteride (PROSCAR) 5 MG tablet Take 5 mg by mouth every morning.       . furosemide (LASIX) 20 MG tablet       . hydrocortisone cream 1 % Apply 1 application topically 2 (two) times daily as needed (itchy rash).       . metFORMIN (GLUCOPHAGE) 500 MG tablet Take 250 mg by mouth 2 (two) times daily with a meal.       . methylcellulose (CITRUCEL) oral powder Take 1 packet by mouth daily. 1 scoop in 8oz of water once daily      . mometasone (NASONEX) 50 MCG/ACT nasal spray Place 1-2 sprays into the nose at bedtime.      . mometasone-formoterol (DULERA) 200-5 MCG/ACT AERO Take 2 puffs first thing in am and then another  2 puffs about 12 hours later.      . Multiple Vitamin (MULTIVITAMIN) tablet Take 1 tablet by mouth daily.        [name] omeprazole (PRILOSEC) 20 MG capsule Take 40 mg by mouth daily with breakfast.       . oxybutynin (DITROPAN) 5 MG tablet Take 5 mg by mouth daily.      [name] oxymetazoline (AFRIN) 0.05 % nasal spray Place 2 sprays into the nose 2 (two) times daily. 2 puffs every 12 hours needed for stuffy nose x 5 days and stop      . polyethylene glycol powder (MIRALAX) powder Take 17 g by mouth daily.       . potassium chloride SA (K-DUR,KLOR-CON) 20 MEQ tablet Take 20 mEq by mouth 2 (two) times daily.      . sulindac (CLINORIL) 200 MG tablet TAKE 1 TABLET BY MOUTH TWICE A DAY  60 tablet  5  . traMADol (ULTRAM) 50 MG tablet Take 50 mg by mouth every 4 (four) hours as needed for  pain.      . traZODone (DESYREL) 150 MG tablet Take 37.5 mg by mouth at bedtime.      . traZODone (DESYREL) 150 MG tablet TAKE 1/3 TABLET BY MOUTH AT BEDTIME  30 tablet  4   No current facility-administered medications for this visit.     Allergies: No Known Allergies  Past Medical History, Surgical history, Social history, and Family History were reviewed and updated.  Review of Systems: Constitutional:  Negative for fever, chills, night sweats, anorexia, weight loss, pain. Cardiovascular: no chest pain or dyspnea on exertion Respiratory: no cough, shortness of breath, or wheezing Neurological: no TIA or stroke symptoms Dermatological: negative ENT: negative Skin: Negative. Gastrointestinal: no abdominal pain, change in bowel habits, or black or bloody stools Genito-Urinary: no dysuria, trouble voiding, or hematuria Hematological and Lymphatic: negative Breast: negative Musculoskeletal: negative Remaining ROS negative.  Physical Exam: Blood pressure 143/76, pulse 99, temperature 97.3 F (36.3 C), temperature source Oral, resp. rate 20, height 5\' 11"  (1.803 m), weight 266 lb 14.4 oz (121.065 kg). ECOG: 1 General appearance: alert Head: Normocephalic, without obvious abnormality, atraumatic Neck: no adenopathy, no carotid bruit, no JVD, supple, symmetrical, trachea midline and thyroid not enlarged, symmetric, no tenderness/mass/nodules Lymph nodes: Cervical, supraclavicular, and axillary nodes normal. Heart:regular rate and rhythm, S1, S2 normal, no murmur, click, rub or gallop Lung:chest clear, no wheezing, rales, normal symmetric air entry Abdomen: soft, non-tender, without masses or organomegaly EXT:no erythema, induration, or nodules  Lab Results: Lab Results  Component Value Date   WBC 1.5* 08/31/2012   HGB 7.8* 08/31/2012   HCT 23.5* 08/31/2012   MCV 107.8* 08/31/2012   PLT 106* 08/31/2012     Chemistry      Component Value Date/Time   NA 132 Repeated and Verified*  08/10/2012 1117   NA 135 02/15/2012 1356   K 4.1 08/10/2012 1117   K 3.7 02/15/2012 1356   CL 99 06/08/2012 1257   CL 96 02/15/2012 1356   CO2 27 08/10/2012 1117   CO2 31 02/15/2012 1356   BUN 13.2 08/10/2012 1117   BUN 16 02/15/2012 1356   CREATININE 0.9 08/10/2012 1117   CREATININE 0.9 02/15/2012 1356      Component Value Date/Time   CALCIUM 9.7 08/10/2012 1117   CALCIUM 9.5 02/15/2012 1356   ALKPHOS 48 08/10/2012 1117   ALKPHOS 33* 07/29/2011 1308   AST 17 08/10/2012 1117   AST 17 07/29/2011 1308  ALT 14 08/10/2012 1117   ALT 17 07/29/2011 1308   BILITOT 0.82 08/10/2012 1117   BILITOT 0.5 07/29/2011 1308      Impression and Plan:  This is a 79 year old gentleman with the following issues:  1. Follicular lymphoma diagnosed in 2007.  He has no evidence to suggest recurrent disease. 2. Pancytopenia due to myelodysplastic syndrome is most likely etiology. He is status post bone marrow biopsy was done on 06/22/2012. The results discussed today with the patient and his daughter and the findings suggestive of myelodysplastic syndrome. No cytogenetics abnormalities are identified no increased blasts were identified. The natural course of this disease and the treatment options were discussed. Treatment options would include supportive care, Revlimid as well as systemic therapy with a myelosuppressive therapy were discussed today. At this point we will continue supportive care and we will consider other therapies down the line. 3. Anemia.  He is to continue on Aranesp 300 mcg but I will change that every 2 weeks I will also set him up with packed red cell transfusion given his recent symptoms of dyspnea on exertion. 4. Monoclonal gammopathy.  His M-spike had been less than 1 g/dL, his bone marrow biopsy continued to show a less than 8% plasma cells indicating most likely multiple myeloma.   5. Followup will be set up in 6 weeks.  Janell Keeling 8/6/20143:00 PM

## 2012-08-31 NOTE — Telephone Encounter (Signed)
Pt sent back to lab and given appt schedule for August thru September.

## 2012-08-31 NOTE — Addendum Note (Signed)
Addended by: Reesa Chew on: 08/31/2012 03:11 PM   Modules accepted: Orders

## 2012-09-01 ENCOUNTER — Other Ambulatory Visit: Payer: Self-pay | Admitting: Adult Health

## 2012-09-01 ENCOUNTER — Ambulatory Visit: Payer: Medicare Other | Admitting: Cardiology

## 2012-09-02 ENCOUNTER — Ambulatory Visit (HOSPITAL_BASED_OUTPATIENT_CLINIC_OR_DEPARTMENT_OTHER): Payer: Medicare Other

## 2012-09-02 ENCOUNTER — Telehealth: Payer: Self-pay | Admitting: Internal Medicine

## 2012-09-02 VITALS — BP 142/86 | HR 80 | Temp 98.1°F | Resp 20

## 2012-09-02 DIAGNOSIS — D649 Anemia, unspecified: Secondary | ICD-10-CM

## 2012-09-02 LAB — PREPARE RBC (CROSSMATCH)

## 2012-09-02 MED ORDER — POTASSIUM CHLORIDE CRYS ER 20 MEQ PO TBCR: 20.0000 meq | EXTENDED_RELEASE_TABLET | Freq: Two times a day (BID) | ORAL | Status: DC

## 2012-09-02 MED ORDER — HEPARIN SOD (PORK) LOCK FLUSH 100 UNIT/ML IV SOLN
250.0000 [IU] | INTRAVENOUS | Status: DC | PRN
  Filled 2012-09-02: qty 5

## 2012-09-02 MED ORDER — HEPARIN SOD (PORK) LOCK FLUSH 100 UNIT/ML IV SOLN
500.0000 [IU] | Freq: Every day | INTRAVENOUS | Status: DC | PRN
  Filled 2012-09-02: qty 5

## 2012-09-02 MED ORDER — FUROSEMIDE 10 MG/ML IJ SOLN
20.0000 mg | Freq: Once | INTRAMUSCULAR | Status: AC
  Administered 2012-09-02: 20 mg via INTRAVENOUS

## 2012-09-02 MED ORDER — SODIUM CHLORIDE 0.9 % IJ SOLN
10.0000 mL | INTRAMUSCULAR | Status: DC | PRN
  Filled 2012-09-02: qty 10

## 2012-09-02 MED ORDER — SODIUM CHLORIDE 0.9 % IJ SOLN
3.0000 mL | INTRAMUSCULAR | Status: DC | PRN
  Filled 2012-09-02: qty 10

## 2012-09-02 MED ORDER — SODIUM CHLORIDE 0.9 % IV SOLN
250.0000 mL | Freq: Once | INTRAVENOUS | Status: AC
  Administered 2012-09-02: 250 mL via INTRAVENOUS

## 2012-09-02 NOTE — Telephone Encounter (Signed)
Spoke with pharmacist @ [facility] States a request for patients potassium (khlor con) was denied Is patient to continue this medication and if so can a refill be sent Dr. [name] please advise, thank you   Appears to have last been filled by Dr. [name]-- 09/23/10 #30 w 11 refills

## 2012-09-02 NOTE — Telephone Encounter (Signed)
Rx sent Nothing further needed at this time

## 2012-09-02 NOTE — Telephone Encounter (Signed)
We still have it on our list and if he's been taking it right along he should have it listed the same way on his med calendar and should be refilled x 12 months

## 2012-09-02 NOTE — Patient Instructions (Addendum)
Blood Transfusion Information WHAT IS A BLOOD TRANSFUSION? A transfusion is the replacement of blood or some of its parts. Blood is made up of multiple cells which provide different functions.  Red blood cells carry oxygen and are used for blood loss replacement.  White blood cells fight against infection.  Platelets control bleeding.  Plasma helps clot blood.  Other blood products are available for specialized needs, such as hemophilia or other clotting disorders. BEFORE THE TRANSFUSION  Who gives blood for transfusions?   You may be able to donate blood to be used at a later date on yourself (autologous donation).  Relatives can be asked to donate blood. This is generally not any safer than if you have received blood from a stranger. The same precautions are taken to ensure safety when a relative's blood is donated.  Healthy volunteers who are fully evaluated to make sure their blood is safe. This is blood bank blood. Transfusion therapy is the safest it has ever been in the practice of medicine. Before blood is taken from a donor, a complete history is taken to make sure that person has no history of diseases nor engages in risky social behavior (examples are intravenous drug use or sexual activity with multiple partners). The donor's travel history is screened to minimize risk of transmitting infections, such as malaria. The donated blood is tested for signs of infectious diseases, such as HIV and hepatitis. The blood is then tested to be sure it is compatible with you in order to minimize the chance of a transfusion reaction. If you or a relative donates blood, this is often done in anticipation of surgery and is not appropriate for emergency situations. It takes many days to process the donated blood. RISKS AND COMPLICATIONS Although transfusion therapy is very safe and saves many lives, the main dangers of transfusion include:   Getting an infectious disease.  Developing a  transfusion reaction. This is an allergic reaction to something in the blood you were given. Every precaution is taken to prevent this. The decision to have a blood transfusion has been considered carefully by your caregiver before blood is given. Blood is not given unless the benefits outweigh the risks. AFTER THE TRANSFUSION  Right after receiving a blood transfusion, you will usually feel much better and more energetic. This is especially true if your red blood cells have gotten low (anemic). The transfusion raises the level of the red blood cells which carry oxygen, and this usually causes an energy increase.  The nurse administering the transfusion will monitor you carefully for complications. HOME CARE INSTRUCTIONS  No special instructions are needed after a transfusion. You may find your energy is better. Speak with your caregiver about any limitations on activity for underlying diseases you may have. SEEK MEDICAL CARE IF:   Your condition is not improving after your transfusion.  You develop redness or irritation at the intravenous (IV) site. SEEK IMMEDIATE MEDICAL CARE IF:  Any of the following symptoms occur over the next 12 hours:  Shaking chills.  You have a temperature by mouth above 102 F (38.9 C), not controlled by medicine.  Chest, back, or muscle pain.  People around you feel you are not acting correctly or are confused.  Shortness of breath or difficulty breathing.  Dizziness and fainting.  You get a rash or develop hives.  You have a decrease in urine output.  Your urine turns a dark color or changes to pink, red, or brown. Any of the following   symptoms occur over the next 10 days:  You have a temperature by mouth above 102 F (38.9 C), not controlled by medicine.  Shortness of breath.  Weakness after normal activity.  The white part of the eye turns yellow (jaundice).  You have a decrease in the amount of urine or are urinating less often.  Your  urine turns a dark color or changes to pink, red, or brown. Document Released: 01/10/2000 Document Revised: 04/06/2011 Document Reviewed: 08/29/2007 [employer] Patient Information 2014 [employer].  

## 2012-09-03 LAB — TYPE AND SCREEN
ABO/RH(D): A POS
Antibody Screen: NEGATIVE
Unit division: 0

## 2012-09-05 ENCOUNTER — Encounter: Payer: Self-pay | Admitting: Internal Medicine

## 2012-09-05 ENCOUNTER — Ambulatory Visit (INDEPENDENT_AMBULATORY_CARE_PROVIDER_SITE_OTHER): Payer: Medicare Other | Admitting: Internal Medicine

## 2012-09-05 VITALS — BP 142/78 | HR 88 | Temp 98.1°F | Ht 71.0 in | Wt 266.2 lb

## 2012-09-05 DIAGNOSIS — J45909 Unspecified asthma, uncomplicated: Secondary | ICD-10-CM

## 2012-09-05 DIAGNOSIS — E1365 Other specified diabetes mellitus with hyperglycemia: Secondary | ICD-10-CM

## 2012-09-05 DIAGNOSIS — J4530 Mild persistent asthma, uncomplicated: Secondary | ICD-10-CM

## 2012-09-05 DIAGNOSIS — R21 Rash and other nonspecific skin eruption: Secondary | ICD-10-CM

## 2012-09-05 MED ORDER — HYDROXYZINE PAMOATE 25 MG PO CAPS: ORAL_CAPSULE | ORAL | Status: DC

## 2012-09-05 MED ORDER — POTASSIUM CHLORIDE CRYS ER 20 MEQ PO TBCR: EXTENDED_RELEASE_TABLET | ORAL | Status: DC

## 2012-09-05 NOTE — Progress Notes (Signed)
Subjective:     Patient ID: [name], male   DOB: 07/19/1932    MRN: [id number]  Brief patient profile:  3 yowm quit smoking 1970 with morbid obesity with boderline DM, Lymphoma s/p chemo ([facility]) and tendency to peripheral edema that is felt to be dependent and related to obesity/ venous insuff.   05/19/2012 f/u ov/[name] re obesity/ dm Chief Complaint  Patient presents with  . Follow-up    Breathing worse the past couple of wks, relates to pollen in the air.   feels dizzy in head before gets shot every 3 weeks for anemia. Min itching/sneezing with onset of pollen season has not used prns appropriately from med calendar action plan. Also having epistaxis sev times a day rec Prednisone 10 mg take  4 each am x 2 days,   2 each am x 2 days,  1 each am x 2 days and stop  Please see patient coordinator before you leave today  to schedule ent evaluation for your nose bleeds > cauterized, resolved   08/19/2012 f/u ov/[name] re obesity/ dm/ chronic leg swelling Chief Complaint  Patient presents with  . Follow-up    Pt reports breathing is worse, slight wheezing at times but no chest tx. Occasional dry cough   better p shots, better p proaire for a few hours Doe x 50 ft, trouble with bleachers. No assoc cp, diaph or nausea rec Try dulera 200 Take 2 puffs first thing in am and then another 2 puffs about 12 hours later.  Only use your albuterol (proaire)  as a rescue medication  09/05/2012 acute  ov/[name] new problem = rash, breathing and cough resolved Chief Complaint  Patient presents with  . Acute Visit    Pt c/o rash and burning sensation left shoulder radiating down left arm- started to notice about a wk ago  initially though to be shingles but rash is actually bilateral upper arms and abd wall, itching rather than painful, no assoc fever or ha, arthralgias  No unusual exp hx - no recent change in meds, no one else in house has similar lesions  Sleeping ok without nocturnal  or  early am exacerbation  of respiratory  c/o's or need for noct saba. Also denies any obvious fluctuation of symptoms with weather or environmental changes or other aggravating or alleviating factors except as outlined above   ROS  The following are not active complaints unless bolded sore throat, dysphagia, dental problems, itching, sneezing,  nasal congestion or excess/ purulent secretions, ear ache,   fever, chills, sweats, unintended wt loss, pleuritic or exertional cp, hemoptysis,  orthopnea pnd or leg swelling, presyncope, palpitations, heartburn, abdominal pain, anorexia, nausea, vomiting, diarrhea  or change in bowel or urinary habits, change in stools or urine, dysuria,hematuria,  rash, arthralgias, visual complaints, headache, numbness weakness or ataxia or problems with walking or coordination,  change in mood/affect or memory.           Past Medical History:   LYMPHOMA NEC, MLIG, INGUINAL/LOWER LIMB (ICD-202.85) dx 04/2005.......[facility]  - last chemo 10/2006 -repeat CT/ABD CT 11/18/07--no reccurence  - Pancytopenia August 06, 2009 > refer back to [facility] > aranesp rx Jan 2012  RHINITIS, ALLERGIC NOS (ICD-477.9)  HYPERLIPIDEMIA NEC/NOS (ICD-272.4)  EXOGENOUS OBESITY (ICD-278.00)  - ideal body weight less than 186  AODM onset 2012 with freq pred for airways issues -HgA1C 5.5 12/12 >metformin decreased 500mg  1/2 Twice daily   Vitamin D Deficiency- level 29>>44 (4/9//10)  Left Hip pain onset  around 6/09............................................................................   [facility]  - MRI 11/23/2007 c/w L1-2 bulging disc indents the thecal sac with foraminal stenosis  HEALTH MAINTENANCE..........................................................................................[facility]  - Td 10/2005  - Pneumovax 10/07 second shot  - CPX 08/20/2010  --colon 02/2005 -int/extern. hems (repeat q7y)  Complex med regimen  --Meds reviewed with pt education and computerized med calendar  completed/adjusted. November 08, 2008 , August 21, 2009 updated 02/19/2011 , 10/23/2011  Syncope...........................................................................................................[facility] KitchenHochrein  - Mobitz II AV block s/p Medtr[employer] pacemaker placement 12/11/2008  [facility]at[facility].....................................................................................................[facility]  - Pruritic rash 04/2009 > tried lotrisone, referred to [facility] August 06, 2009    Family History:   mother had diabetes   Social History:  Patient states former smoker.  quit smoking in 1970  No ETOH  Retired            Objective:   Physical Exam wt 244 January 16, 2008 > 249 April 24, 2009 > 249 10/06/2010 >  11/27/2010  245 > 01/07/2011 241 > 02/19/11 238 > 06/04/2011  247 >07/03/2011 246 > 07/31/2011  248 > 09/11/2011  247 >246 10/23/2011 > 258 02/15/2012 > 05/19/2012  258 > 08/19/2012 255 >  266 09/05/12  Ambulatory obese wm in no acute distress but seems overall much more frail  HEENT: nl dentition, turbinates, and orophanx.  Mucus membranes pink and moist. No tongue or throat exudate.  Neck: supple with full ROM. no JVD, node enlargement or TMJ   Lungs: clear and equal bilateral breath sounds. No wheezing, rhonchi or rales  Chest: RRR. No murmurs, rubs, or gallops.  1- 2 plus peripheral edema.  Abd: soft and non tender. Normal excursion in the supine position.  Ext warm and dry, no calf tenderness, cyanosis or clubbing. mild/mod chronic venous insufficiency changes. Varicose veins present. Neuro: nl sensorium and gait..  Skin:  multiple circular erythematous macules a quart sized with central excoriation, all appear to be about the same age   CXR  08/19/2012 :  Cardiomegaly with mild chronic peribronchial thickening. No evidence for focal airspace disease or frank pulmonary edema.     Assessment:

## 2012-09-05 NOTE — Patient Instructions (Addendum)
Increase glucophage to a whole pill twice daily - if your am sugars drop to less than 75 then go back to half twice daily - check it at least twice weekly  For itching, take vistaril 25 mg 1-2 every 4 hours may cause drowsiness  Call me with the name of your dematologist and if you need me take an appt for you. Add: need MMSE and ? Referral to neuro ? W/in [facility]

## 2012-09-06 ENCOUNTER — Ambulatory Visit (INDEPENDENT_AMBULATORY_CARE_PROVIDER_SITE_OTHER): Payer: Medicare Other | Admitting: Cardiology

## 2012-09-06 ENCOUNTER — Encounter: Payer: Self-pay | Admitting: Cardiology

## 2012-09-06 VITALS — BP 134/78 | HR 89 | Ht 71.0 in | Wt 266.8 lb

## 2012-09-06 DIAGNOSIS — I4891 Unspecified atrial fibrillation: Secondary | ICD-10-CM

## 2012-09-06 DIAGNOSIS — Z950 Presence of cardiac pacemaker: Secondary | ICD-10-CM

## 2012-09-06 DIAGNOSIS — I10 Essential (primary) hypertension: Secondary | ICD-10-CM

## 2012-09-06 DIAGNOSIS — I441 Atrioventricular block, second degree: Secondary | ICD-10-CM

## 2012-09-06 LAB — PACEMAKER DEVICE OBSERVATION
AL IMPEDENCE PM: 423 Ohm
AL THRESHOLD: 0.75 V
ATRIAL PACING PM: 0.3
BATTERY VOLTAGE: 2.79 V
RV LEAD THRESHOLD: 0.75 V

## 2012-09-06 NOTE — Assessment & Plan Note (Signed)
-   hfa   08/19/2012  75%   - trial of dulera 200 08/19/2012  > improved  Adequate control on present rx, reviewed > no change in rx needed

## 2012-09-06 NOTE — Progress Notes (Signed)
ELECTROPHYSIOLOGY OFFICE NOTE  Patient ID: [name] MRN: [id number], DOB/AGE: July 26, 1932   Date of Visit: 09/06/2012  Primary Physician: Sandrea Hughs, MD Primary Cardiologist: Hillis Range, MD Reason for Visit: EP/device follow-up  History of Present Illness  [name] is a 79 y.o. male with Mobitz II AV block s/p PPM implant, HTN, DM and lymphoma who presents today for routine electrophysiology followup. Since last being seen in our clinic, he reports he is doing well from a cardiac standpoint and has no cardiac complaints. He has chronic DOE and underwent stress testing back in February which was a low risk study. Since then, he has required multiple transfusions for anemia. He denies chest pain or shortness of breath. He denies palpitations, dizziness, near syncope or syncope. He has chronic, intermittent LE swelling but states this is no worse than baseline. He denies orthopnea, PND or recent weight gain. He is compliant and tolerating medications without difficulty.  Past Medical History Past Medical History  Diagnosis Date  . Dyspnea   . 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   . Allergic rhinitis   . Hyperlipidemia   . Exogenous obesity   . Edema     venous insufficiency  . Diabetes mellitus 2012    T2DM  . Second degree Mobitz II AV block 12/11/08    with transient syncope, resolved s/p PPM  . Paroxysmal atrial fibrillation 03/16/11    diagnosed by PPM interrogation  . Pancytopenia   . Postural dizziness     Past Surgical History Past Surgical History  Procedure Laterality Date  . Lymph node biopsy      groin  . Sternotomy  2000  . Pericardiectomy  2000  . Penile prosthesis implant      and removal  . Pacemaker insertion  12/11/08    MDT implanted by Dr Johney Frame    Allergies/Intolerances No Known Allergies  Current Home Medications Current Outpatient Prescriptions  Medication Sig Dispense  Refill  . acetaminophen (TYLENOL) 500 MG tablet Take 500-1,000 mg by mouth every 4 (four) hours as needed for pain.       [name] albuterol (PROVENTIL HFA;VENTOLIN HFA) 108 (90 BASE) MCG/ACT inhaler Inhale 2 puffs into the lungs every 6 (six) hours as needed for wheezing.      [name] aspirin 81 MG tablet Take 81 mg by mouth every morning.       . calcium-vitamin D (OS-CAL 500 + D) 500-200 MG-UNIT per tablet Take 1 tablet by mouth 2 (two) times daily.        . Cholecalciferol (VITAMIN D) 1000 UNITS capsule Take 1,000 Units by mouth 2 (two) times daily.        [name] dextromethorphan-guaiFENesin (MUCINEX DM) 30-600 MG per 12 hr tablet Take 1 tablet by mouth every 12 (twelve) hours as needed (cough).       . docusate sodium (STOOL SOFTENER) 100 MG capsule Take 100 mg by mouth daily as needed for constipation.       [name] escitalopram (LEXAPRO) 20 MG tablet Take 10 mg by mouth at bedtime.      . famotidine (PEPCID) 20 MG tablet Take 20 mg by mouth at bedtime.       . finasteride (PROSCAR) 5 MG tablet Take 5 mg by mouth every morning.       . furosemide (LASIX) 20 MG tablet Take 20 mg by mouth daily.       . hydrocortisone  cream 1 % Apply 1 application topically 2 (two) times daily as needed (itchy rash).       . hydrOXYzine (VISTARIL) 25 MG capsule 1-2 every 4 hours as needed for itching  30 capsule  11  . metFORMIN (GLUCOPHAGE) 500 MG tablet Take 250 mg by mouth 2 (two) times daily with a meal.       . methylcellulose (CITRUCEL) oral powder Take 1 packet by mouth daily. 1 scoop in 8oz of water once daily      . mometasone (NASONEX) 50 MCG/ACT nasal spray Place 1-2 sprays into the nose at bedtime.      . mometasone-formoterol (DULERA) 200-5 MCG/ACT AERO Take 2 puffs first thing in am and then another 2 puffs about 12 hours later.      . Multiple Vitamin (MULTIVITAMIN) tablet Take 1 tablet by mouth daily.        [name] omeprazole (PRILOSEC) 20 MG capsule Take 40 mg by mouth daily with breakfast.       . oxybutynin (DITROPAN)  5 MG tablet Take 5 mg by mouth daily.      [name] oxymetazoline (AFRIN) 0.05 % nasal spray Place 2 sprays into the nose 2 (two) times daily. 2 puffs every 12 hours needed for stuffy nose x 5 days and stop      . polyethylene glycol powder (MIRALAX) powder Take 17 g by mouth daily.       . potassium chloride SA (K-DUR,KLOR-CON) 20 MEQ tablet One each am      . sulindac (CLINORIL) 200 MG tablet TAKE 1 TABLET BY MOUTH TWICE A DAY  60 tablet  5  . traMADol (ULTRAM) 50 MG tablet Take 50 mg by mouth every 4 (four) hours as needed for pain.      . traZODone (DESYREL) 150 MG tablet TAKE 1/3 TABLET BY MOUTH AT BEDTIME  30 tablet  4   No current facility-administered medications for this visit.   Social History Social History  . Marital Status: Widowed   Occupational History  . retired    Social History Main Topics  . Smoking status: Former Smoker -- 1.00 packs/day for 30 years    Types: Cigarettes    Quit date: 01/27/1968  . Smokeless tobacco: Former Neurosurgeon  . Alcohol Use: No  . Drug Use: No   Review of Systems General: No chills, fever, night sweats or weight changes Cardiovascular: No chest pain, dyspnea on exertion, edema, orthopnea, palpitations, paroxysmal nocturnal dyspnea Dermatological: No rash, lesions or masses Respiratory: No cough, dyspnea Urologic: No hematuria, dysuria Abdominal: No nausea, vomiting, diarrhea, bright red blood per rectum, melena, or hematemesis Neurologic: No visual changes, weakness, changes in mental status All other systems reviewed and are otherwise negative except as noted above.  Physical Exam Vitals: Blood pressure 134/78, pulse 89, height 5\' 11"  (1.803 m), weight 266 lb 12.8 oz (121.02 kg), SpO2 95.00%.  General: Well developed, well appearing 79 y.o. male in no acute distress. HEENT: Normocephalic, atraumatic. EOMs intact. Sclera nonicteric. Oropharynx clear.  Neck: Supple. No JVD. Lungs: Respirations regular and unlabored, CTA bilaterally. No wheezes,  rales or rhonchi. Heart: RRR. S1, S2 present. No murmurs, rub, S3 or S4. Abdomen: Soft, non-distended.  Extremities: No clubbing or cyanosis. Trace pedal edema bilaterally at ankles. PT/Radials 2+ and equal bilaterally. Psych: Normal affect. Neuro: Alert and oriented X 3. Moves all extremities spontaneously.   Diagnostics Device interrogation today - Normal device function. Thresholds, sensing, impedances consistent with previous measurements. Device programmed to maximize  longevity. 35  mode switch episodes but no AHRs, <0.1% of time. No high ventricular rates noted. Device programmed at appropriate safety margins. Histogram distribution appropriate for patient activity level. Device programmed to optimize intrinsic conduction. Estimated longevity 9 years. ROV with Dr. Johney Frame in 6 months  Assessment and Plan 1. Mobitz II AV block s/p PPM implant Normal device function No programming changes made Return to see Dr. Johney Frame in 6 months 2. PAF No episodes by interrogation today AF burden is low Given his anemia, thrombocytopenia, advanced age, frequent nose bleeds and co-morbidities, he is a poor candidate for anticoagulation at this time. 3. HTN Stable; normotensive  Continue current regimen  Signed, Addysen Louth, PA-C 09/06/2012, 2:11 PM

## 2012-09-06 NOTE — Assessment & Plan Note (Signed)
Most c/w bug bites with excoriation from scratching vs drug reaction but certainly not HZ  Will try vistaril and refer to dermatology

## 2012-09-06 NOTE — Assessment & Plan Note (Signed)
-   New onset 2012 while on freq doses of pred for airways dz >>Nutritionist referral 06/20/2010  And started on metfomin 07/02/10  Lab Results  Component Value Date   HGBA1C 7.1* 08/19/2012     Trending up with last fbs 167 on glucophage 250 bid so increased to 500 bid     Each maintenance medication was reviewed in detail including most importantly the difference between maintenance and as needed and under what circumstances the prns are to be used. This was done in the context of a medication calendar review which provided the patient with a user-friendly unambiguous mechanism for medication administration and reconciliation and provides an action plan for all active problems. It is critical that this be shown to every doctor  for modification during the office visit if necessary so the patient can use it as a working document.

## 2012-09-16 ENCOUNTER — Other Ambulatory Visit (HOSPITAL_BASED_OUTPATIENT_CLINIC_OR_DEPARTMENT_OTHER): Payer: Medicare Other | Admitting: Lab

## 2012-09-16 ENCOUNTER — Ambulatory Visit (HOSPITAL_BASED_OUTPATIENT_CLINIC_OR_DEPARTMENT_OTHER): Payer: Medicare Other

## 2012-09-16 VITALS — BP 142/77 | HR 87 | Temp 97.0°F | Resp 20

## 2012-09-16 DIAGNOSIS — D649 Anemia, unspecified: Secondary | ICD-10-CM

## 2012-09-16 LAB — CBC WITH DIFFERENTIAL/PLATELET
BASO%: 0 % (ref 0.0–2.0)
Eosinophils Absolute: 0 10*3/uL (ref 0.0–0.5)
HCT: 28.1 % — ABNORMAL LOW (ref 38.4–49.9)
MCHC: 33.5 g/dL (ref 32.0–36.0)
MONO#: 0 10*3/uL — ABNORMAL LOW (ref 0.1–0.9)
NEUT#: 0.6 10*3/uL — ABNORMAL LOW (ref 1.5–6.5)
RBC: 2.67 10*6/uL — ABNORMAL LOW (ref 4.20–5.82)
WBC: 1.4 10*3/uL — ABNORMAL LOW (ref 4.0–10.3)
lymph#: 0.8 10*3/uL — ABNORMAL LOW (ref 0.9–3.3)

## 2012-09-16 MED ORDER — DARBEPOETIN ALFA-POLYSORBATE 300 MCG/0.6ML IJ SOLN
300.0000 ug | Freq: Once | INTRAMUSCULAR | Status: AC
  Administered 2012-09-16: 300 ug via SUBCUTANEOUS
  Filled 2012-09-16: qty 0.6

## 2012-09-19 ENCOUNTER — Ambulatory Visit (INDEPENDENT_AMBULATORY_CARE_PROVIDER_SITE_OTHER): Payer: Medicare Other | Admitting: Adult Health

## 2012-09-19 ENCOUNTER — Encounter: Payer: Self-pay | Admitting: Adult Health

## 2012-09-19 VITALS — BP 126/74 | HR 80 | Temp 97.2°F | Ht 71.0 in | Wt 261.0 lb

## 2012-09-19 DIAGNOSIS — R413 Other amnesia: Secondary | ICD-10-CM

## 2012-09-19 NOTE — Progress Notes (Signed)
Subjective:     Patient ID: [name], male   DOB: Jan 13, 1933    MRN: [id number]  Brief patient profile:  26 yowm quit smoking 1970 with morbid obesity with boderline DM, Lymphoma s/p chemo ([facility]) and tendency to peripheral edema that is felt to be dependent and related to obesity/ venous insuff.   05/19/2012 f/u ov/Wert re obesity/ dm Chief Complaint  Patient presents with  . Follow-up    Breathing worse the past couple of wks, relates to pollen in the air.   feels dizzy in head before gets shot every 3 weeks for anemia. Min itching/sneezing with onset of pollen season has not used prns appropriately from med calendar action plan. Also having epistaxis sev times a day rec Prednisone 10 mg take  4 each am x 2 days,   2 each am x 2 days,  1 each am x 2 days and stop  Please see patient coordinator before you leave today  to schedule ent evaluation for your nose bleeds > cauterized, resolved   08/19/2012 f/u ov/Wert re obesity/ dm/ chronic leg swelling Chief Complaint  Patient presents with  . Follow-up    Pt reports breathing is worse, slight wheezing at times but no chest tx. Occasional dry cough   better p shots, better p proaire for a few hours Doe x 50 ft, trouble with bleachers. No assoc cp, diaph or nausea rec Try dulera 200 Take 2 puffs first thing in am and then another 2 puffs about 12 hours later.  Only use your albuterol (proaire)  as a rescue medication  09/05/2012 acute  ov/Wert new problem = rash, breathing and cough resolved Chief Complaint  Patient presents with  . Acute Visit    Pt c/o rash and burning sensation left shoulder radiating down left arm- started to notice about a wk ago  initially though to be shingles but rash is actually bilateral upper arms and abd wall, itching rather than painful, no assoc fever or ha, arthralgias  >>refer to derm , vistaril rx   09/19/2012 Follow up  Returns for follow up for memory issues.  More forgetful. Family  noticing trouble with memory as well.  MMSE today was 27/30, had difficulty with answers.  Clock draw was okay but took him a long time.  Had real trouble writing a sentence-he finally gave up.  No FH of Dementia per pt.  No headache, syncope, seizure act, visual/speech changes.    Had rash on arms last ov. Went to derm, currently rx cream to use on arms.     ROS  The following are not active complaints unless bolded sore throat, dysphagia, dental problems, itching, sneezing,  nasal congestion or excess/ purulent secretions, ear ache,   fever, chills, sweats, unintended wt loss, pleuritic or exertional cp, hemoptysis,  orthopnea pnd or leg swelling, presyncope, palpitations, heartburn, abdominal pain, anorexia, nausea, vomiting, diarrhea  or change in bowel or urinary habits, change in stools or urine, dysuria,hematuria,  rash, arthralgias, visual complaints, headache, numbness weakness or ataxia or problems with walking or coordination,  change in mood/affect or memory.       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   [facility]  - MRI 11/23/2007 c/w L1-2 bulging disc indents the thecal sac with foraminal stenosis  HEALTH MAINTENANCE..........................................................................................[facility] KitchenWert  - Td 10/2005  - Pneumovax 10/07 second shot  - CPX  08/20/2010  --colon 02/2005 -int/extern. hems (repeat q7y)  Complex med regimen  --Meds reviewed with pt education and computerized med calendar completed/adjusted. November 08, 2008 , August 21, 2009 updated 02/19/2011 , 10/23/2011  Syncope...........................................................................................................[facility] KitchenHochrein  - Mobitz II AV block s/p Medtronic pacemaker placement 12/11/2008  [facility]at[facility].....................................................................................................[facility]  - Pruritic rash 04/2009 > tried lotrisone, referred to [facility] August 06, 2009  Memory impairment , MMSE 27/30 8/25>refer to neuro   Family History:   mother had diabetes   Social History:  Patient states former smoker.  quit smoking in 1970  No ETOH  Retired            Objective:   Physical Exam wt 244 January 16, 2008 > 249 April 24, 2009 > 249 10/06/2010 >  11/27/2010  245 > 01/07/2011 241 > 02/19/11 238 > 06/04/2011  247 >07/03/2011 246 > 07/31/2011  248 > 09/11/2011  247 >246 10/23/2011 > 258 02/15/2012 > 05/19/2012  258 > 08/19/2012 255 >  266 09/05/12> 09/19/2012  261  Ambulatory obese wm in no acute distress but seems overall much more frail  HEENT: nl dentition, turbinates, and orophanx.  Mucus membranes pink and moist. No tongue or throat exudate.  Neck: supple with full ROM. no JVD, node enlargement or TMJ   Lungs: clear and equal bilateral breath sounds. No wheezing, rhonchi or rales  Chest: RRR. No murmurs, rubs, or gallops.  Tr-1+ plus peripheral edema.  Abd: soft and non tender. Normal excursion in the supine position.  Ext warm and dry, no calf tenderness, cyanosis or clubbing. mild/mod chronic venous insufficiency changes. Varicose veins present. Neuro: nl sensorium and gait..  Skin:   Few excortiated lesions noted on arms   09/19/2012  MMSE 27/30  Clock draw : nml  CXR  08/19/2012 : Cardiomegaly with mild chronic  peribronchial thickening. No evidence for focal airspace disease or frank pulmonary edema.     Assessment:

## 2012-09-19 NOTE — Patient Instructions (Addendum)
We updated your med calendar , follow this closely and bring to each visit  We will refer you to neurology to evaluate your memory.  Keep on low sweet and cholesterol diet.  follow up Dr. [name]  In 2 months and As needed

## 2012-09-22 NOTE — Assessment & Plan Note (Signed)
Abnormal MMSE and increased forgetfulness  Refer to neuro Spoke with daughter on phone and notified of finding per pt/family request.

## 2012-09-27 NOTE — Addendum Note (Signed)
Addended by: Boone Master E on: 09/27/2012 12:53 PM   Modules accepted: Orders

## 2012-09-28 ENCOUNTER — Other Ambulatory Visit: Payer: Self-pay | Admitting: Adult Health

## 2012-09-30 ENCOUNTER — Encounter: Payer: Self-pay | Admitting: Internal Medicine

## 2012-09-30 ENCOUNTER — Other Ambulatory Visit: Payer: Self-pay | Admitting: *Deleted

## 2012-09-30 ENCOUNTER — Other Ambulatory Visit (HOSPITAL_BASED_OUTPATIENT_CLINIC_OR_DEPARTMENT_OTHER): Payer: Medicare Other

## 2012-09-30 ENCOUNTER — Ambulatory Visit (INDEPENDENT_AMBULATORY_CARE_PROVIDER_SITE_OTHER): Payer: Medicare Other | Admitting: Internal Medicine

## 2012-09-30 ENCOUNTER — Telehealth: Payer: Self-pay | Admitting: Internal Medicine

## 2012-09-30 ENCOUNTER — Ambulatory Visit (INDEPENDENT_AMBULATORY_CARE_PROVIDER_SITE_OTHER)
Admission: RE | Admit: 2012-09-30 | Discharge: 2012-09-30 | Disposition: A | Discharge status: Home / Self Care | Payer: Medicare Other | Source: Ambulatory Visit | Attending: Internal Medicine | Admitting: Internal Medicine

## 2012-09-30 ENCOUNTER — Other Ambulatory Visit (INDEPENDENT_AMBULATORY_CARE_PROVIDER_SITE_OTHER): Payer: Medicare Other

## 2012-09-30 ENCOUNTER — Ambulatory Visit (HOSPITAL_BASED_OUTPATIENT_CLINIC_OR_DEPARTMENT_OTHER): Payer: Medicare Other

## 2012-09-30 VITALS — BP 149/81 | HR 89 | Temp 99.2°F

## 2012-09-30 VITALS — BP 142/90 | HR 133 | Temp 97.1°F | Ht 70.0 in | Wt 247.0 lb

## 2012-09-30 DIAGNOSIS — R109 Unspecified abdominal pain: Secondary | ICD-10-CM

## 2012-09-30 DIAGNOSIS — I10 Essential (primary) hypertension: Secondary | ICD-10-CM

## 2012-09-30 DIAGNOSIS — E1365 Other specified diabetes mellitus with hyperglycemia: Secondary | ICD-10-CM

## 2012-09-30 DIAGNOSIS — R05 Cough: Secondary | ICD-10-CM

## 2012-09-30 DIAGNOSIS — IMO0002 Reserved for concepts with insufficient information to code with codable children: Secondary | ICD-10-CM

## 2012-09-30 DIAGNOSIS — D649 Anemia, unspecified: Secondary | ICD-10-CM

## 2012-09-30 DIAGNOSIS — R059 Cough, unspecified: Secondary | ICD-10-CM

## 2012-09-30 LAB — CBC WITH DIFFERENTIAL/PLATELET
Basophils Absolute: 0 10*3/uL (ref 0.0–0.1)
EOS%: 2.8 % (ref 0.0–7.0)
Eosinophils Absolute: 0 10*3/uL (ref 0.0–0.7)
Eosinophils Absolute: 0 10*3/uL (ref 0.0–0.5)
Eosinophils Relative: 2.1 % (ref 0.0–5.0)
HCT: 27.2 % — ABNORMAL LOW (ref 39.0–52.0)
Lymphs Abs: 0.8 10*3/uL (ref 0.7–4.0)
MCH: 36.9 pg — ABNORMAL HIGH (ref 27.2–33.4)
MCHC: 34.4 g/dL (ref 30.0–36.0)
MCV: 106.6 fl — ABNORMAL HIGH (ref 78.0–100.0)
MCV: 106.2 fL — ABNORMAL HIGH (ref 79.3–98.0)
MONO%: 1.8 % (ref 0.0–14.0)
Monocytes Absolute: 0 10*3/uL — ABNORMAL LOW (ref 0.1–1.0)
NEUT#: 0.7 10*3/uL — ABNORMAL LOW (ref 1.5–6.5)
Neutrophils Relative %: 48.5 % (ref 43.0–77.0)
Platelets: 115 10*3/uL — ABNORMAL LOW (ref 150.0–400.0)
RBC: 2.51 10*6/uL — ABNORMAL LOW (ref 4.20–5.82)
RDW: 20.6 % — ABNORMAL HIGH (ref 11.5–14.6)
RDW: 20 % — ABNORMAL HIGH (ref 11.0–14.6)

## 2012-09-30 LAB — URINALYSIS
Ketones, ur: NEGATIVE
Leukocytes, UA: NEGATIVE
Nitrite: NEGATIVE
Specific Gravity, Urine: 1.01 (ref 1.000–1.030)
Urobilinogen, UA: 0.2 (ref 0.0–1.0)

## 2012-09-30 LAB — HEPATIC FUNCTION PANEL
ALT: 19 U/L (ref 0–53)
AST: 18 U/L (ref 0–37)
Alkaline Phosphatase: 51 U/L (ref 39–117)
Total Bilirubin: 1 mg/dL (ref 0.3–1.2)

## 2012-09-30 LAB — BASIC METABOLIC PANEL
Calcium: 9.8 mg/dL (ref 8.4–10.5)
GFR: 115.89 mL/min (ref >60.00)
Glucose, Bld: 130 mg/dL — ABNORMAL HIGH (ref 70–99)
Sodium: 132 mEq/L — ABNORMAL LOW (ref 135–145)

## 2012-09-30 MED ORDER — DARBEPOETIN ALFA-POLYSORBATE 300 MCG/0.6ML IJ SOLN
300.0000 ug | Freq: Once | INTRAMUSCULAR | Status: AC
  Administered 2012-09-30: 300 ug via SUBCUTANEOUS
  Filled 2012-09-30: qty 0.6

## 2012-09-30 MED ORDER — MOMETASONE FURO-FORMOTEROL FUM 200-5 MCG/ACT IN AERO: INHALATION_SPRAY | RESPIRATORY_TRACT | Status: DC

## 2012-09-30 MED ORDER — TRAMADOL HCL 50 MG PO TABS: ORAL_TABLET | ORAL | Status: DC

## 2012-09-30 NOTE — Progress Notes (Signed)
Subjective:     Patient ID: [name], male   DOB: March 03, 1932    MRN: [id number]  Brief patient profile:  60 yowm quit smoking 1970 with morbid obesity with boderline DM, Lymphoma s/p chemo ([facility]) and tendency to peripheral edema that is felt to be dependent and related to obesity/ venous insuff.   05/19/2012 f/u ov/[name] re obesity/ dm Chief Complaint  Patient presents with  . Follow-up    Breathing worse the past couple of wks, relates to pollen in the air.   feels dizzy in head before gets shot every 3 weeks for anemia. Min itching/sneezing with onset of pollen season has not used prns appropriately from med calendar action plan. Also having epistaxis sev times a day rec Prednisone 10 mg take  4 each am x 2 days,   2 each am x 2 days,  1 each am x 2 days and stop  Please see patient coordinator before you leave today  to schedule ent evaluation for your nose bleeds > cauterized, resolved   08/19/2012 f/u ov/[name] re obesity/ dm/ chronic leg swelling Chief Complaint  Patient presents with  . Follow-up    Pt reports breathing is worse, slight wheezing at times but no chest tx. Occasional dry cough   better p shots, better p proaire for a few hours Doe x 50 ft, trouble with bleachers. No assoc cp, diaph or nausea rec Try dulera 200 Take 2 puffs first thing in am and then another 2 puffs about 12 hours later.  Only use your albuterol (proaire)  as a rescue medication  09/05/2012 acute  ov/[name] new problem = rash, breathing and cough resolved Chief Complaint  Patient presents with  . Acute Visit    Pt c/o rash and burning sensation left shoulder radiating down left arm- started to notice about a wk ago  initially though to be shingles but rash is actually bilateral upper arms and abd wall, itching rather than painful, no assoc fever or ha, arthralgias  >>refer to derm , vistaril rx   09/19/2012 Follow up  Returns for follow up for memory issues.  More forgetful. Family  noticing trouble with memory as well.  MMSE today was 27/30, had difficulty with answers.  Clock draw was okay but took him a long time.  Had real trouble writing a sentence-he finally gave up.  No FH of Dementia per pt.  No headache, syncope, seizure act, visual/speech changes.  rec No change rx Neuro referral     09/30/2012 acute  ov/[name] re "abd pain" Chief Complaint  Patient presents with  . Acute Visit    Pt c/o low abd pain and right side pain x 2 days. He also states urine has been dark in color for the past 3 days.   has not taken any pain med, no rigors, no n or v, pain worse across both sides of back when bends over. Remembers "missing a step" prior to onset of back pain which forms a c around upper abd but is equal on both sides. No sob.  No change chronic cough or worse pain with cough  No obvious daytime variabilty or assoc ex/pleuritic cp or chest tightness, subjective wheeze overt sinus or hb symptoms. No unusual exp hx or h/o childhood pna/ asthma or knowledge of premature birth.   Sleeping ok without nocturnal  or early am exacerbation  of respiratory  c/o's or need for noct saba. Also denies any obvious fluctuation of symptoms with weather or  environmental changes or other aggravating or alleviating factors except as outlined above     Current Medications, Allergies, Past Medical History, Past Surgical History, Family History, and Social History were reviewed in [employer] record.  ROS  The following are not active complaints unless bolded sore throat, dysphagia, dental problems, itching, sneezing,  nasal congestion or excess/ purulent secretions, ear ache,   fever, chills, sweats, unintended wt loss, pleuritic or exertional cp, hemoptysis,  orthopnea pnd or leg swelling, presyncope, palpitations, heartburn, abdominal pain, anorexia, nausea, vomiting, diarrhea  or change in bowel or urinary habits, change in stools or urine, dysuria,hematuria,   rash, arthralgias, visual complaints, headache, numbness weakness or ataxia or problems with walking or coordination,  change in mood/affect or memory.                 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   [facility]  - MRI 11/23/2007 c/w L1-2 bulging disc indents the thecal sac with foraminal stenosis  HEALTH MAINTENANCE..........................................................................................Marland KitchenWert  - Td 10/2005  - Pneumovax 10/07 second shot  - CPX 08/20/2010  --colon 02/2005 -int/extern. hems (repeat q7y)  Complex med regimen  --Meds reviewed with pt education and computerized med calendar completed/adjusted. November 08, 2008 , August 21, 2009 updated 02/19/2011 , 10/23/2011  Syncope...........................................................................................................Marland KitchenHochrein  - Mobitz II AV block s/p Medtronic pacemaker placement 12/11/2008  Dermatology.....................................................................................................[facility]  - Pruritic rash 04/2009 > tried lotrisone, [facility] August 06, 2009  Memory impairment , MMSE 27/30 8/25>refer to neuro   Family History:   mother had diabetes   Social History:  Patient states former smoker.  quit smoking in 1970  No ETOH  Retired             Objective:   Physical Exam wt 244 January 16, 2008 > 249 April 24, 2009 > 249 10/06/2010 >  11/27/2010  245 > 01/07/2011 241 > 02/19/11 238 > 06/04/2011  247 >07/03/2011 246 > 07/31/2011  248 > 09/11/2011  247 >246 10/23/2011 > 258 02/15/2012 > 05/19/2012  258 > 08/19/2012 255 >  266 09/05/12> 09/19/2012  261  Ambulatory obese wm in no acute distress but seems overall much more frail  HEENT: nl dentition, turbinates, and orophanx.  Mucus membranes pink and moist. No tongue or throat exudate.  Neck: supple with full ROM. no JVD, node enlargement or TMJ   Lungs: clear and equal bilateral breath sounds. No wheezing, rhonchi or rales  Chest: RRR. No murmurs, rubs, or gallops.  Tr-1+ plus peripheral edema.  Abd: quite distended and obese but soft and no rebound or focal tenderness. Normal excursion in the supine position.  Ext warm and dry, no calf tenderness, cyanosis or clubbing. mild/mod chronic venous insufficiency changes. Varicose veins present. Neuro: nl sensorium and gait..  Skin:   Few excortiated lesions noted on arms     09/19/2012  MMSE 27/30  Clock draw : nml  CXR  08/19/2012 : Cardiomegaly with mild chronic peribronchial thickening. No evidence  for focal airspace disease or frank pulmonary edema.     CXR  09/30/2012 : 1. Stable cardiomegaly without failure.  2. Emphysema.            Assessment:

## 2012-09-30 NOTE — Assessment & Plan Note (Signed)
Labs ok and strongly suspect this is referred pain from T spine ? Compression fx though not apparent on cxr  rec trial of tramadol then consider bone scan to look for occult spine fx if not better vs abd ct if has any GI symptoms evolving. Which are not apparent by today's hx or exam

## 2012-09-30 NOTE — Telephone Encounter (Signed)
appt set for today at 1:30. Jennifer Castillo, CMA  

## 2012-09-30 NOTE — Patient Instructions (Addendum)
As per calendar, take the tramadol 50 mg up to every 4 hours as needed for pain  Please remember to go to the lab and x-ray department downstairs for your tests - we will call you with the results when they are available.    Keep previous appt

## 2012-09-30 NOTE — Assessment & Plan Note (Signed)
CT sinus 06/20/10 >>   Neg  Chronic and relatively well controlled on present rx, reviewed

## 2012-09-30 NOTE — Assessment & Plan Note (Addendum)
Hemoglobin A1C  Date Value Range Status  08/19/2012 7.1* 4.6 - 6.5 % Final     Glycemic Control Guidelines for People with Diabetes:Non Diabetic:  <6%Goal of Therapy: <7%Additional Action Suggested:  >8%   05/19/2012 6.6* 4.6 - 6.5 % Final     Glycemic Control Guidelines for People with Diabetes:Non Diabetic:  <6%Goal of Therapy: <7%Additional Action Suggested:  >8%   02/15/2012 6.4  4.6 - 6.5 % Final     Glycemic Control Guidelines for People with Diabetes:Non Diabetic:  <6%Goal of Therapy: <7%Additional Action Suggested:  >8%      Adequate control on present rx, reviewed > no change in rx needed      Each maintenance medication was reviewed in detail including most importantly the difference between maintenance and as needed and under what circumstances the prns are to be used. This was done in the context of a medication calendar review which provided the patient with a user-friendly unambiguous mechanism for medication administration and reconciliation and provides an action plan for all active problems. It is critical that this be shown to every doctor  for modification during the office visit if necessary so the patient can use it as a working document.

## 2012-09-30 NOTE — Assessment & Plan Note (Signed)
Adequate control on present rx, reviewed > no change in rx needed   

## 2012-10-03 ENCOUNTER — Telehealth: Payer: Self-pay | Admitting: Internal Medicine

## 2012-10-03 NOTE — Telephone Encounter (Signed)
Notes Recorded by Nyoka Cowden, MD on 09/30/2012 at 5:17 PM Call patient : Studies are unremarkable, no change in recs ( his pancytopenia is from prev chemo and is no sign change)    I spoke with patient about results and he verbalized understanding and had no questions

## 2012-10-04 ENCOUNTER — Encounter: Payer: Self-pay | Admitting: Gastroenterology

## 2012-10-04 ENCOUNTER — Telehealth: Payer: Self-pay | Admitting: *Deleted

## 2012-10-04 NOTE — Telephone Encounter (Signed)
Per Dr. Arlyce Dice, no routine colonoscopy in the absence of symptoms. Attempted phone call to patient to inquire and inform. No answer, left message for patient to return my phone call to [phone].

## 2012-10-04 NOTE — Telephone Encounter (Signed)
Dr. Arlyce Dice, this is a prior patient of Dr. Doreatha Martin. His last colonoscopy 02/2005 was hemorrhoids only. His prior colonoscopy was 2004 with TA. He has quite a complicated medical history. He was seen by pulmonology for abd pain on 09/30/12, which they attributed to T-spine compression fx??? Do you feel this patient needs an office visit first prior to proceeding with colonoscopy, does he need recall colonoscopy with history and age, or okay to proceed with colonoscopy without office visit? Please advise. Thank you. French Ana

## 2012-10-04 NOTE — Telephone Encounter (Signed)
Pt returned my call and upon exploring further, pt has some vague complaints of abdominal pain that appears to be relieved by Tramadol that was prescribed last week. He also states he has some issues with bloating. Discussed with Dr. Arlyce Dice who feels it would be best to have an office visit at this time to further explore patient complaints.

## 2012-10-04 NOTE — Telephone Encounter (Signed)
Please pull his prior colonoscopy reports.  I would like to review them first.

## 2012-10-12 ENCOUNTER — Encounter: Payer: Self-pay | Admitting: Internal Medicine

## 2012-10-14 ENCOUNTER — Ambulatory Visit: Payer: Medicare Other

## 2012-10-14 ENCOUNTER — Ambulatory Visit (HOSPITAL_BASED_OUTPATIENT_CLINIC_OR_DEPARTMENT_OTHER): Payer: Medicare Other | Admitting: Oncology

## 2012-10-14 ENCOUNTER — Other Ambulatory Visit (HOSPITAL_BASED_OUTPATIENT_CLINIC_OR_DEPARTMENT_OTHER): Payer: Medicare Other | Admitting: Lab

## 2012-10-14 ENCOUNTER — Telehealth: Payer: Self-pay | Admitting: *Deleted

## 2012-10-14 VITALS — BP 145/81 | HR 92 | Temp 98.5°F | Resp 20 | Ht 70.0 in | Wt 258.2 lb

## 2012-10-14 DIAGNOSIS — K469 Unspecified abdominal hernia without obstruction or gangrene: Secondary | ICD-10-CM

## 2012-10-14 DIAGNOSIS — C8299 Follicular lymphoma, unspecified, extranodal and solid organ sites: Secondary | ICD-10-CM

## 2012-10-14 DIAGNOSIS — D472 Monoclonal gammopathy: Secondary | ICD-10-CM

## 2012-10-14 DIAGNOSIS — D61818 Other pancytopenia: Secondary | ICD-10-CM

## 2012-10-14 DIAGNOSIS — D469 Myelodysplastic syndrome, unspecified: Secondary | ICD-10-CM

## 2012-10-14 DIAGNOSIS — D649 Anemia, unspecified: Secondary | ICD-10-CM

## 2012-10-14 LAB — HOLD TUBE, BLOOD BANK

## 2012-10-14 LAB — CBC WITH DIFFERENTIAL/PLATELET
Eosinophils Absolute: 0 10*3/uL (ref 0.0–0.5)
MONO#: 0 10*3/uL — ABNORMAL LOW (ref 0.1–0.9)
NEUT#: 0.6 10*3/uL — ABNORMAL LOW (ref 1.5–6.5)
RBC: 2.47 10*6/uL — ABNORMAL LOW (ref 4.20–5.82)
RDW: 19.5 % — ABNORMAL HIGH (ref 11.0–14.6)
WBC: 1.4 10*3/uL — ABNORMAL LOW (ref 4.0–10.3)

## 2012-10-14 MED ORDER — DARBEPOETIN ALFA-POLYSORBATE 300 MCG/0.6ML IJ SOLN
300.0000 ug | Freq: Once | INTRAMUSCULAR | Status: AC
  Administered 2012-10-14: 300 ug via SUBCUTANEOUS
  Filled 2012-10-14: qty 0.6

## 2012-10-14 NOTE — Telephone Encounter (Signed)
appts made and printed...td 

## 2012-10-14 NOTE — Progress Notes (Signed)
Aranesp injection given during office visit.

## 2012-10-14 NOTE — Progress Notes (Signed)
Hematology and Oncology Follow Up Visit  [name] [id number] 10-08-1932 79 y.o. 10/14/2012 9:24 AM  CC: [name]. [name], MD, FCCP    Principle Diagnosis: A 79 year old gentleman with the following diagnoses:   1. Follicular lymphoma as a grade 3, stage III, diagnosed 2007, continued to be in remission. 2. Pancytopenia associated with mild neutropenia and macrocytosis possibly indicating early myelodysplasia. 3. Monoclonal gammopathy of undetermined significance.  Prior Therapy:  1. Status post CHOP and rituximab.  He had complete response to therapy concluded in 2007.  No further imaging has been indicated at this time. 2. Status post bone marrow biopsy in April 2011, did not really show any evidence of myelodysplasia or metastatic lymphoma at this time.  Current therapy: Aranesp 300 mcg every 3 weeks to keep his hemoglobin close to 11.  Interim History: [name] presents today for a followup visit.  He is reporting less fatigue and decline in his health since his last visit. He reports no chest pain but DOE.  He had not had any hospitalization, had not had any illnesses.  Had not reported any evidence of bleeding.  Had not had any infection. His activity level is less at this time. He does not report any dysphagia.  He does not report any lymphadenopathy. No night sweats. Overall performance status, activity level is about the same. He still drives short distances. He feels better with every two week injections.   Medications: I have reviewed the patient's current medications Current Outpatient Prescriptions  Medication Sig Dispense Refill  . acetaminophen (TYLENOL) 500 MG tablet 1-2 tabs by mouth every hours as needed for headache      . albuterol (PROVENTIL HFA;VENTOLIN HFA) 108 (90 BASE) MCG/ACT inhaler Inhale 2 puffs into the lungs every 4 (four) hours as needed for wheezing or shortness of breath.       [name] aspirin 81 MG tablet Take 81 mg by mouth every morning. (hold if  bleeding)      . calcium-vitamin D (OS-CAL 500 + D) 500-200 MG-UNIT per tablet Take 1 tablet by mouth 2 (two) times daily.        . cetirizine (ZYRTEC) 10 MG tablet Take 10 mg by mouth at bedtime as needed for allergies.      . Cholecalciferol (VITAMIN D) 1000 UNITS capsule Take 1,000 Units by mouth 2 (two) times daily.        [name] dextromethorphan-guaiFENesin (MUCINEX DM) 30-600 MG per 12 hr tablet Take 1 tablet by mouth every 12 (twelve) hours as needed (cough).       . escitalopram (LEXAPRO) 20 MG tablet 1/2 tab by mouth at bedtime      . famotidine (PEPCID) 20 MG tablet Take 20 mg by mouth at bedtime.       . finasteride (PROSCAR) 5 MG tablet Take 5 mg by mouth every morning.       . furosemide (LASIX) 20 MG tablet Take 20 mg by mouth daily. May take 1 extra daily if needed for swelling in legs      . hydrocortisone cream 1 % Apply 1 application topically 2 (two) times daily as needed (itchy rash).       . hydrOXYzine (VISTARIL) 25 MG capsule 1-2 every 4 hours as needed for itching  30 capsule  11  . metFORMIN (GLUCOPHAGE) 500 MG tablet Take 500 mg by mouth 2 (two) times daily with a meal.       . methylcellulose (CITRUCEL) oral powder 1 scoop in 8oz  of water at bedtime      . mometasone (NASONEX) 50 MCG/ACT nasal spray 0-2 puffs in the morning and 1-2 puffs at bedtime      . mometasone-formoterol (DULERA) 200-5 MCG/ACT AERO Take 2 puffs first thing in am and then another 2 puffs about 12 hours later.  1 Inhaler  5  . Multiple Vitamin (MULTIVITAMIN) tablet Take 1 tablet by mouth daily.        [name] omeprazole (PRILOSEC) 20 MG capsule 2 capsule by mouth 30 minutes before breakfast      . oxybutynin (DITROPAN) 5 MG tablet Take 5 mg by mouth every morning.       . polyethylene glycol powder (MIRALAX) powder Take 17 g by mouth at bedtime.       . potassium chloride SA (K-DUR,KLOR-CON) 20 MEQ tablet One each am      . sulindac (CLINORIL) 200 MG tablet       . traMADol (ULTRAM) 50 MG tablet 1-2 every 4  hours as needed for cough or pain  40 tablet  0  . traZODone (DESYREL) 150 MG tablet TAKE 1/3 TABLET BY MOUTH AT BEDTIME  30 tablet  4   No current facility-administered medications for this visit.     Allergies: No Known Allergies  Past Medical History, Surgical history, Social history, and Family History were reviewed and updated.  Review of Systems:  Remaining ROS negative.  Physical Exam: Blood pressure 145/81, pulse 92, temperature 98.5 F (36.9 C), temperature source Oral, resp. rate 20, height 5\' 10"  (1.778 m), weight 258 lb 3.2 oz (117.119 kg). ECOG: 1 General appearance: alert Head: Normocephalic, without obvious abnormality, atraumatic Neck: no adenopathy, no carotid bruit, no JVD, supple, symmetrical, trachea midline and thyroid not enlarged, symmetric, no tenderness/mass/nodules Lymph nodes: Cervical, supraclavicular, and axillary nodes normal. Heart:regular rate and rhythm, S1, S2 normal, no murmur, click, rub or gallop Lung:chest clear, no wheezing, rales, normal symmetric air entry Abdomen: soft, non-tender, without masses or organomegaly EXT:no erythema, induration, or nodules  Lab Results: Lab Results  Component Value Date   WBC 1.4* 10/14/2012   HGB 9.1* 10/14/2012   HCT 26.4* 10/14/2012   MCV 107.1* 10/14/2012   PLT 106* 10/14/2012     Chemistry      Component Value Date/Time   NA 132* 09/30/2012 1404   NA 132 Repeated and Verified* 08/10/2012 1117   K 4.4 09/30/2012 1404   K 4.1 08/10/2012 1117   CL 95* 09/30/2012 1404   CL 99 06/08/2012 1257   CO2 31 09/30/2012 1404   CO2 27 08/10/2012 1117   BUN 11 09/30/2012 1404   BUN 13.2 08/10/2012 1117   CREATININE 0.7 09/30/2012 1404   CREATININE 0.9 08/10/2012 1117      Component Value Date/Time   CALCIUM 9.8 09/30/2012 1404   CALCIUM 9.7 08/10/2012 1117   ALKPHOS 51 09/30/2012 1404   ALKPHOS 48 08/10/2012 1117   AST 18 09/30/2012 1404   AST 17 08/10/2012 1117   ALT 19 09/30/2012 1404   ALT 14 08/10/2012 1117   BILITOT 1.0  09/30/2012 1404   BILITOT 0.82 08/10/2012 1117      Impression and Plan:  This is a 79 year old gentleman with the following issues:  1. Follicular lymphoma diagnosed in 2007.  He has no evidence to suggest recurrent disease. 2. Pancytopenia due to myelodysplastic syndrome is most likely etiology. He is status post bone marrow biopsy was done on 06/22/2012.  He is to continue on Aranesp 300  mcg every 2 weeks I will also set him up with packed red cell transfusion as needed. 3. Monoclonal gammopathy.  His M-spike had been less than 1 g/dL, his bone marrow biopsy continued to show a less than 8% plasma cells indicating most likely multiple myeloma.   4. Followup will be set up in 8 weeks.  Farren Nelles 9/19/20149:24 AM

## 2012-10-17 ENCOUNTER — Ambulatory Visit: Payer: Self-pay | Admitting: Diagnostic Neuroimaging

## 2012-10-19 ENCOUNTER — Encounter: Payer: Medicare Other | Admitting: Gastroenterology

## 2012-10-27 ENCOUNTER — Inpatient Hospital Stay (HOSPITAL_COMMUNITY)
Admission: EM | Admit: 2012-10-27 | Discharge: 2012-11-08 | DRG: 260 | Disposition: A | Discharge status: Home Health | Payer: Medicare Other | Attending: Internal Medicine | Admitting: Internal Medicine

## 2012-10-27 DIAGNOSIS — R0602 Shortness of breath: Secondary | ICD-10-CM

## 2012-10-27 DIAGNOSIS — D61818 Other pancytopenia: Secondary | ICD-10-CM

## 2012-10-27 DIAGNOSIS — Z7982 Long term (current) use of aspirin: Secondary | ICD-10-CM

## 2012-10-27 DIAGNOSIS — I872 Venous insufficiency (chronic) (peripheral): Secondary | ICD-10-CM

## 2012-10-27 DIAGNOSIS — R059 Cough, unspecified: Secondary | ICD-10-CM

## 2012-10-27 DIAGNOSIS — R609 Edema, unspecified: Secondary | ICD-10-CM

## 2012-10-27 DIAGNOSIS — I10 Essential (primary) hypertension: Secondary | ICD-10-CM

## 2012-10-27 DIAGNOSIS — T827XXD Infection and inflammatory reaction due to other cardiac and vascular devices, implants and grafts, subsequent encounter: Secondary | ICD-10-CM

## 2012-10-27 DIAGNOSIS — R531 Weakness: Secondary | ICD-10-CM

## 2012-10-27 DIAGNOSIS — A4901 Methicillin susceptible Staphylococcus aureus infection, unspecified site: Secondary | ICD-10-CM

## 2012-10-27 DIAGNOSIS — E1365 Other specified diabetes mellitus with hyperglycemia: Secondary | ICD-10-CM | POA: Diagnosis present

## 2012-10-27 DIAGNOSIS — R509 Fever, unspecified: Secondary | ICD-10-CM

## 2012-10-27 DIAGNOSIS — D649 Anemia, unspecified: Secondary | ICD-10-CM

## 2012-10-27 DIAGNOSIS — E876 Hypokalemia: Secondary | ICD-10-CM | POA: Diagnosis not present

## 2012-10-27 DIAGNOSIS — R413 Other amnesia: Secondary | ICD-10-CM

## 2012-10-27 DIAGNOSIS — Y831 Surgical operation with implant of artificial internal device as the cause of abnormal reaction of the patient, or of later complication, without mention of misadventure at the time of the procedure: Secondary | ICD-10-CM | POA: Diagnosis present

## 2012-10-27 DIAGNOSIS — R109 Unspecified abdominal pain: Secondary | ICD-10-CM

## 2012-10-27 DIAGNOSIS — R05 Cough: Secondary | ICD-10-CM

## 2012-10-27 DIAGNOSIS — Z113 Encounter for screening for infections with a predominantly sexual mode of transmission: Secondary | ICD-10-CM

## 2012-10-27 DIAGNOSIS — E8779 Other fluid overload: Secondary | ICD-10-CM | POA: Diagnosis not present

## 2012-10-27 DIAGNOSIS — D472 Monoclonal gammopathy: Secondary | ICD-10-CM | POA: Diagnosis present

## 2012-10-27 DIAGNOSIS — J45909 Unspecified asthma, uncomplicated: Secondary | ICD-10-CM

## 2012-10-27 DIAGNOSIS — J309 Allergic rhinitis, unspecified: Secondary | ICD-10-CM | POA: Diagnosis present

## 2012-10-27 DIAGNOSIS — A4101 Sepsis due to Methicillin susceptible Staphylococcus aureus: Secondary | ICD-10-CM | POA: Diagnosis present

## 2012-10-27 DIAGNOSIS — Z87891 Personal history of nicotine dependence: Secondary | ICD-10-CM

## 2012-10-27 DIAGNOSIS — R21 Rash and other nonspecific skin eruption: Secondary | ICD-10-CM

## 2012-10-27 DIAGNOSIS — E872 Acidosis, unspecified: Secondary | ICD-10-CM

## 2012-10-27 DIAGNOSIS — D469 Myelodysplastic syndrome, unspecified: Secondary | ICD-10-CM | POA: Diagnosis present

## 2012-10-27 DIAGNOSIS — K469 Unspecified abdominal hernia without obstruction or gangrene: Secondary | ICD-10-CM

## 2012-10-27 DIAGNOSIS — R5381 Other malaise: Secondary | ICD-10-CM | POA: Diagnosis not present

## 2012-10-27 DIAGNOSIS — E871 Hypo-osmolality and hyponatremia: Secondary | ICD-10-CM

## 2012-10-27 DIAGNOSIS — I4891 Unspecified atrial fibrillation: Secondary | ICD-10-CM

## 2012-10-27 DIAGNOSIS — E785 Hyperlipidemia, unspecified: Secondary | ICD-10-CM

## 2012-10-27 DIAGNOSIS — Z79899 Other long term (current) drug therapy: Secondary | ICD-10-CM

## 2012-10-27 DIAGNOSIS — M549 Dorsalgia, unspecified: Secondary | ICD-10-CM

## 2012-10-27 DIAGNOSIS — Z6834 Body mass index (BMI) 34.0-34.9, adult: Secondary | ICD-10-CM

## 2012-10-27 DIAGNOSIS — Z951 Presence of aortocoronary bypass graft: Secondary | ICD-10-CM

## 2012-10-27 DIAGNOSIS — E559 Vitamin D deficiency, unspecified: Secondary | ICD-10-CM

## 2012-10-27 DIAGNOSIS — K219 Gastro-esophageal reflux disease without esophagitis: Secondary | ICD-10-CM

## 2012-10-27 DIAGNOSIS — C8595 Non-Hodgkin lymphoma, unspecified, lymph nodes of inguinal region and lower limb: Secondary | ICD-10-CM

## 2012-10-27 DIAGNOSIS — I441 Atrioventricular block, second degree: Secondary | ICD-10-CM

## 2012-10-27 DIAGNOSIS — G9340 Encephalopathy, unspecified: Secondary | ICD-10-CM

## 2012-10-27 DIAGNOSIS — N139 Obstructive and reflux uropathy, unspecified: Secondary | ICD-10-CM

## 2012-10-27 DIAGNOSIS — I2510 Atherosclerotic heart disease of native coronary artery without angina pectoris: Secondary | ICD-10-CM | POA: Diagnosis present

## 2012-10-27 DIAGNOSIS — A419 Sepsis, unspecified organism: Secondary | ICD-10-CM

## 2012-10-27 DIAGNOSIS — J310 Chronic rhinitis: Secondary | ICD-10-CM

## 2012-10-27 DIAGNOSIS — M545 Low back pain, unspecified: Secondary | ICD-10-CM

## 2012-10-27 DIAGNOSIS — E669 Obesity, unspecified: Secondary | ICD-10-CM

## 2012-10-27 DIAGNOSIS — D72819 Decreased white blood cell count, unspecified: Secondary | ICD-10-CM

## 2012-10-27 DIAGNOSIS — IMO0002 Reserved for concepts with insufficient information to code with codable children: Secondary | ICD-10-CM

## 2012-10-27 DIAGNOSIS — R7881 Bacteremia: Secondary | ICD-10-CM

## 2012-10-27 DIAGNOSIS — I519 Heart disease, unspecified: Secondary | ICD-10-CM | POA: Diagnosis present

## 2012-10-27 DIAGNOSIS — B9561 Methicillin susceptible Staphylococcus aureus infection as the cause of diseases classified elsewhere: Secondary | ICD-10-CM

## 2012-10-27 DIAGNOSIS — I442 Atrioventricular block, complete: Secondary | ICD-10-CM

## 2012-10-27 DIAGNOSIS — T502X5A Adverse effect of carbonic-anhydrase inhibitors, benzothiadiazides and other diuretics, initial encounter: Secondary | ICD-10-CM | POA: Diagnosis not present

## 2012-10-27 DIAGNOSIS — T827XXA Infection and inflammatory reaction due to other cardiac and vascular devices, implants and grafts, initial encounter: Secondary | ICD-10-CM

## 2012-10-27 DIAGNOSIS — R0789 Other chest pain: Secondary | ICD-10-CM

## 2012-10-27 LAB — CG4 I-STAT (LACTIC ACID): Lactic Acid, Venous: 4.77 mmol/L — ABNORMAL HIGH (ref 0.5–2.2)

## 2012-10-27 MED ORDER — SODIUM CHLORIDE 0.9 % IV BOLUS (SEPSIS)
1000.0000 mL | Freq: Once | INTRAVENOUS | Status: AC
  Administered 2012-10-28: 1000 mL via INTRAVENOUS

## 2012-10-27 MED ORDER — SODIUM CHLORIDE 0.9 % IV BOLUS (SEPSIS)
1000.0000 mL | Freq: Once | INTRAVENOUS | Status: AC
  Administered 2012-10-27: 1000 mL via INTRAVENOUS

## 2012-10-27 NOTE — ED Notes (Signed)
Per EMS: Pt from home. Pt been feeling bad for the past 2 wks. Today he started having lower back pain and strong smelling, thick urine. Temp 99.9 (oral).

## 2012-10-27 NOTE — ED Notes (Signed)
Lactic Acid was given to Dr. Anitra Lauth.

## 2012-10-27 NOTE — ED Notes (Signed)
Bed: WA08 Expected date:  Expected time:  Means of arrival:  Comments: EMS/[place]-80 yo-fever/difficulty urinating

## 2012-10-28 ENCOUNTER — Ambulatory Visit: Payer: Medicare Other

## 2012-10-28 ENCOUNTER — Other Ambulatory Visit: Payer: Medicare Other | Admitting: Lab

## 2012-10-28 ENCOUNTER — Inpatient Hospital Stay (HOSPITAL_COMMUNITY): Payer: Medicare Other

## 2012-10-28 ENCOUNTER — Other Ambulatory Visit: Payer: Self-pay | Admitting: Diagnostic Radiology

## 2012-10-28 ENCOUNTER — Emergency Department (HOSPITAL_COMMUNITY): Payer: Medicare Other

## 2012-10-28 ENCOUNTER — Encounter (HOSPITAL_COMMUNITY): Payer: Self-pay | Admitting: Internal Medicine

## 2012-10-28 DIAGNOSIS — R509 Fever, unspecified: Secondary | ICD-10-CM

## 2012-10-28 DIAGNOSIS — I517 Cardiomegaly: Secondary | ICD-10-CM

## 2012-10-28 DIAGNOSIS — R109 Unspecified abdominal pain: Secondary | ICD-10-CM

## 2012-10-28 DIAGNOSIS — R5381 Other malaise: Secondary | ICD-10-CM

## 2012-10-28 DIAGNOSIS — E872 Acidosis, unspecified: Secondary | ICD-10-CM | POA: Insufficient documentation

## 2012-10-28 DIAGNOSIS — I059 Rheumatic mitral valve disease, unspecified: Secondary | ICD-10-CM

## 2012-10-28 LAB — BLOOD GAS, ARTERIAL
Acid-base deficit: 2.7 mmol/L — ABNORMAL HIGH (ref 0.0–2.0)
Bicarbonate: 20.2 mEq/L (ref 20.0–24.0)
O2 Saturation: 95.4 %
Patient temperature: 98.6
pH, Arterial: 7.451 — ABNORMAL HIGH (ref 7.350–7.450)

## 2012-10-28 LAB — CBC WITH DIFFERENTIAL/PLATELET
Basophils Absolute: 0 10*3/uL (ref 0.0–0.1)
Basophils Relative: 0 % (ref 0–1)
Eosinophils Absolute: 0 10*3/uL (ref 0.0–0.7)
Eosinophils Relative: 0 % (ref 0–5)
HCT: 25.6 % — ABNORMAL LOW (ref 39.0–52.0)
Hemoglobin: 8.9 g/dL — ABNORMAL LOW (ref 13.0–17.0)
Lymphocytes Relative: 11 % — ABNORMAL LOW (ref 12–46)
Lymphs Abs: 0.2 10*3/uL — ABNORMAL LOW (ref 0.7–4.0)
MCH: 36.8 pg — ABNORMAL HIGH (ref 26.0–34.0)
MCHC: 34.8 g/dL (ref 30.0–36.0)
MCV: 105.8 fL — ABNORMAL HIGH (ref 78.0–100.0)
Monocytes Absolute: 0 10*3/uL — ABNORMAL LOW (ref 0.1–1.0)
Monocytes Relative: 1 % — ABNORMAL LOW (ref 3–12)
Neutro Abs: 1.6 10*3/uL — ABNORMAL LOW (ref 1.7–7.7)
Neutrophils Relative %: 88 % — ABNORMAL HIGH (ref 43–77)
Platelets: 97 10*3/uL — ABNORMAL LOW (ref 150–400)
RBC: 2.42 MIL/uL — ABNORMAL LOW (ref 4.22–5.81)
RDW: 17.4 % — ABNORMAL HIGH (ref 11.5–15.5)
WBC: 1.8 10*3/uL — ABNORMAL LOW (ref 4.0–10.5)

## 2012-10-28 LAB — COMPREHENSIVE METABOLIC PANEL
ALT: 17 U/L (ref 0–53)
AST: 18 U/L (ref 0–37)
Albumin: 3.9 g/dL (ref 3.5–5.2)
Alkaline Phosphatase: 50 U/L (ref 39–117)
BUN: 15 mg/dL (ref 6–23)
CO2: 25 mEq/L (ref 19–32)
Calcium: 9.4 mg/dL (ref 8.4–10.5)
Chloride: 93 mEq/L — ABNORMAL LOW (ref 96–112)
Creatinine, Ser: 0.86 mg/dL (ref 0.50–1.35)
GFR calc Af Amer: >90 mL/min (ref >90)
GFR calc non Af Amer: 81 mL/min — ABNORMAL LOW (ref >90)
Glucose, Bld: 166 mg/dL — ABNORMAL HIGH (ref 70–99)
Potassium: 3.7 mEq/L (ref 3.5–5.1)
Sodium: 131 mEq/L — ABNORMAL LOW (ref 135–145)
Total Bilirubin: 1.5 mg/dL — ABNORMAL HIGH (ref 0.3–1.2)
Total Protein: 6.7 g/dL (ref 6.0–8.3)

## 2012-10-28 LAB — PROTEIN AND GLUCOSE, CSF: Glucose, CSF: 99 mg/dL — ABNORMAL HIGH (ref 43–76)

## 2012-10-28 LAB — HEPATIC FUNCTION PANEL
AST: 16 U/L (ref 0–37)
Albumin: 3.3 g/dL — ABNORMAL LOW (ref 3.5–5.2)
Bilirubin, Direct: 0.5 mg/dL — ABNORMAL HIGH (ref 0.0–0.3)
Indirect Bilirubin: 0.9 mg/dL (ref 0.3–0.9)
Total Bilirubin: 1.4 mg/dL — ABNORMAL HIGH (ref 0.3–1.2)

## 2012-10-28 LAB — CBC
HCT: 23.4 % — ABNORMAL LOW (ref 39.0–52.0)
Hemoglobin: 8.1 g/dL — ABNORMAL LOW (ref 13.0–17.0)
MCH: 36.3 pg — ABNORMAL HIGH (ref 26.0–34.0)
MCV: 104.9 fL — ABNORMAL HIGH (ref 78.0–100.0)
Platelets: 79 10*3/uL — ABNORMAL LOW (ref 150–400)
RBC: 2.23 MIL/uL — ABNORMAL LOW (ref 4.22–5.81)
RDW: 17.8 % — ABNORMAL HIGH (ref 11.5–15.5)
WBC: 1.7 10*3/uL — ABNORMAL LOW (ref 4.0–10.5)

## 2012-10-28 LAB — CSF CELL COUNT WITH DIFFERENTIAL
RBC Count, CSF: 3 /mm3 — ABNORMAL HIGH
WBC, CSF: 0 /mm3 (ref 0–5)

## 2012-10-28 LAB — URINALYSIS, ROUTINE W REFLEX MICROSCOPIC
Bilirubin Urine: NEGATIVE
Glucose, UA: NEGATIVE mg/dL
Hgb urine dipstick: NEGATIVE
Ketones, ur: 15 mg/dL — AB
Leukocytes, UA: NEGATIVE
Nitrite: NEGATIVE
Protein, ur: NEGATIVE mg/dL
Specific Gravity, Urine: 1.018 (ref 1.005–1.030)
Urobilinogen, UA: 1 mg/dL (ref 0.0–1.0)
pH: 6 (ref 5.0–8.0)

## 2012-10-28 LAB — BASIC METABOLIC PANEL
CO2: 21 mEq/L (ref 19–32)
Calcium: 8.6 mg/dL (ref 8.4–10.5)
Chloride: 91 mEq/L — ABNORMAL LOW (ref 96–112)
Glucose, Bld: 140 mg/dL — ABNORMAL HIGH (ref 70–99)
Potassium: 3.3 mEq/L — ABNORMAL LOW (ref 3.5–5.1)
Sodium: 127 mEq/L — ABNORMAL LOW (ref 135–145)

## 2012-10-28 LAB — GRAM STAIN: Special Requests: NORMAL

## 2012-10-28 LAB — GLUCOSE, CAPILLARY
Glucose-Capillary: 154 mg/dL — ABNORMAL HIGH (ref 70–99)
Glucose-Capillary: 191 mg/dL — ABNORMAL HIGH (ref 70–99)

## 2012-10-28 LAB — CK: Total CK: 92 U/L (ref 7–232)

## 2012-10-28 LAB — TROPONIN I: Troponin I: <0.3 ng/mL (ref <0.30)

## 2012-10-28 LAB — AMMONIA: Ammonia: 36 umol/L (ref 11–60)

## 2012-10-28 LAB — D-DIMER, QUANTITATIVE (NOT AT ARMC): D-Dimer, Quant: 0.68 ug/mL-FEU — ABNORMAL HIGH (ref 0.00–0.48)

## 2012-10-28 LAB — LACTIC ACID, PLASMA: Lactic Acid, Venous: 5.4 mmol/L — ABNORMAL HIGH (ref 0.5–2.2)

## 2012-10-28 MED ORDER — DEXTROSE 5 % IV SOLN
760.0000 mg | Freq: Three times a day (TID) | INTRAVENOUS | Status: DC
  Administered 2012-10-28 – 2012-10-29 (×4): 760 mg via INTRAVENOUS
  Filled 2012-10-28 (×7): qty 15.2

## 2012-10-28 MED ORDER — POTASSIUM CHLORIDE CRYS ER 20 MEQ PO TBCR
40.0000 meq | EXTENDED_RELEASE_TABLET | Freq: Once | ORAL | Status: AC
  Administered 2012-10-28: 40 meq via ORAL
  Filled 2012-10-28: qty 2

## 2012-10-28 MED ORDER — DEXTROSE 5 % IV SOLN
2.0000 g | Freq: Once | INTRAVENOUS | Status: AC
  Administered 2012-10-28: 2 g via INTRAVENOUS
  Filled 2012-10-28: qty 2

## 2012-10-28 MED ORDER — ACETAMINOPHEN 325 MG PO TABS
650.0000 mg | ORAL_TABLET | Freq: Four times a day (QID) | ORAL | Status: DC | PRN
  Filled 2012-10-28: qty 2

## 2012-10-28 MED ORDER — LORATADINE 10 MG PO TABS
10.0000 mg | ORAL_TABLET | Freq: Every day | ORAL | Status: DC
  Administered 2012-10-28 – 2012-11-08 (×12): 10 mg via ORAL
  Filled 2012-10-28 (×12): qty 1

## 2012-10-28 MED ORDER — ALBUTEROL SULFATE (5 MG/ML) 0.5% IN NEBU
2.5000 mg | INHALATION_SOLUTION | Freq: Four times a day (QID) | RESPIRATORY_TRACT | Status: DC
  Administered 2012-10-29 (×2): 2.5 mg via RESPIRATORY_TRACT
  Filled 2012-10-28 (×2): qty 0.5

## 2012-10-28 MED ORDER — PNEUMOCOCCAL VAC POLYVALENT 25 MCG/0.5ML IJ INJ
0.5000 mL | INJECTION | INTRAMUSCULAR | Status: AC
  Administered 2012-10-29: 10:00:00 0.5 mL via INTRAMUSCULAR
  Filled 2012-10-28 (×2): qty 0.5

## 2012-10-28 MED ORDER — FAMOTIDINE 20 MG PO TABS
20.0000 mg | ORAL_TABLET | Freq: Every day | ORAL | Status: DC
  Administered 2012-10-28 – 2012-11-07 (×11): 20 mg via ORAL
  Filled 2012-10-28 (×15): qty 1

## 2012-10-28 MED ORDER — ALBUTEROL SULFATE (5 MG/ML) 0.5% IN NEBU
2.5000 mg | INHALATION_SOLUTION | RESPIRATORY_TRACT | Status: DC
  Administered 2012-10-28 (×4): 2.5 mg via RESPIRATORY_TRACT
  Filled 2012-10-28 (×5): qty 0.5

## 2012-10-28 MED ORDER — IPRATROPIUM BROMIDE 0.02 % IN SOLN
0.5000 mg | Freq: Four times a day (QID) | RESPIRATORY_TRACT | Status: DC
  Administered 2012-10-29 – 2012-10-31 (×10): 0.5 mg via RESPIRATORY_TRACT
  Filled 2012-10-28 (×10): qty 2.5

## 2012-10-28 MED ORDER — ACETAMINOPHEN 500 MG PO TABS: 1000.0000 mg | ORAL_TABLET | Freq: Four times a day (QID) | ORAL | Status: DC | PRN

## 2012-10-28 MED ORDER — IOHEXOL 350 MG/ML SOLN
100.0000 mL | Freq: Once | INTRAVENOUS | Status: AC | PRN
  Administered 2012-10-28: 100 mL via INTRAVENOUS

## 2012-10-28 MED ORDER — TRAZODONE HCL 50 MG PO TABS
50.0000 mg | ORAL_TABLET | Freq: Every day | ORAL | Status: DC
  Administered 2012-10-28 – 2012-11-07 (×11): 50 mg via ORAL
  Filled 2012-10-28 (×14): qty 1

## 2012-10-28 MED ORDER — ONDANSETRON HCL 4 MG/2ML IJ SOLN: 4.0000 mg | Freq: Four times a day (QID) | INTRAMUSCULAR | Status: DC | PRN

## 2012-10-28 MED ORDER — FINASTERIDE 5 MG PO TABS
5.0000 mg | ORAL_TABLET | Freq: Every morning | ORAL | Status: DC
  Administered 2012-10-28 – 2012-11-08 (×12): 5 mg via ORAL
  Filled 2012-10-28 (×12): qty 1

## 2012-10-28 MED ORDER — FLUTICASONE PROPIONATE 50 MCG/ACT NA SUSP
2.0000 | Freq: Every day | NASAL | Status: DC
  Administered 2012-10-28 – 2012-11-08 (×10): 2 via NASAL
  Filled 2012-10-28: qty 16

## 2012-10-28 MED ORDER — HYDROCORTISONE 1 % EX CREA
1.0000 "application " | TOPICAL_CREAM | Freq: Two times a day (BID) | CUTANEOUS | Status: DC | PRN
  Filled 2012-10-28: qty 28

## 2012-10-28 MED ORDER — VANCOMYCIN HCL 10 G IV SOLR
1250.0000 mg | Freq: Two times a day (BID) | INTRAVENOUS | Status: DC
  Administered 2012-10-28: 09:00:00 1250 mg via INTRAVENOUS
  Filled 2012-10-28 (×2): qty 1250

## 2012-10-28 MED ORDER — ESCITALOPRAM OXALATE 10 MG PO TABS
10.0000 mg | ORAL_TABLET | Freq: Every day | ORAL | Status: DC
  Administered 2012-10-28 – 2012-11-07 (×11): 10 mg via ORAL
  Filled 2012-10-28 (×14): qty 1

## 2012-10-28 MED ORDER — ACETAMINOPHEN 650 MG RE SUPP: 650.0000 mg | Freq: Four times a day (QID) | RECTAL | Status: DC | PRN

## 2012-10-28 MED ORDER — DEXTROSE 5 % IV SOLN
2.0000 g | Freq: Three times a day (TID) | INTRAVENOUS | Status: DC
  Administered 2012-10-28 – 2012-10-29 (×4): 2 g via INTRAVENOUS
  Filled 2012-10-28 (×5): qty 2

## 2012-10-28 MED ORDER — VANCOMYCIN HCL IN DEXTROSE 1-5 GM/200ML-% IV SOLN
1000.0000 mg | Freq: Two times a day (BID) | INTRAVENOUS | Status: DC
  Administered 2012-10-28 – 2012-10-29 (×3): 1000 mg via INTRAVENOUS
  Filled 2012-10-28 (×4): qty 200

## 2012-10-28 MED ORDER — ACETAMINOPHEN 325 MG PO TABS
650.0000 mg | ORAL_TABLET | Freq: Four times a day (QID) | ORAL | Status: DC | PRN
  Administered 2012-10-28 – 2012-11-02 (×2): 650 mg via ORAL
  Filled 2012-10-28: qty 2

## 2012-10-28 MED ORDER — SODIUM CHLORIDE 0.9 % IV SOLN
INTRAVENOUS | Status: AC
  Administered 2012-10-28 – 2012-10-29 (×3): via INTRAVENOUS

## 2012-10-28 MED ORDER — PANTOPRAZOLE SODIUM 40 MG PO TBEC
40.0000 mg | DELAYED_RELEASE_TABLET | Freq: Every day | ORAL | Status: DC
  Administered 2012-10-28 – 2012-11-08 (×12): 40 mg via ORAL
  Filled 2012-10-28 (×12): qty 1

## 2012-10-28 MED ORDER — IOHEXOL 300 MG/ML  SOLN
50.0000 mL | Freq: Once | INTRAMUSCULAR | Status: AC | PRN
  Administered 2012-10-28: 50 mL via ORAL

## 2012-10-28 MED ORDER — DM-GUAIFENESIN ER 30-600 MG PO TB12
1.0000 | ORAL_TABLET | Freq: Two times a day (BID) | ORAL | Status: DC | PRN
  Filled 2012-10-28: qty 1

## 2012-10-28 MED ORDER — OXYBUTYNIN CHLORIDE 5 MG PO TABS
10.0000 mg | ORAL_TABLET | Freq: Every morning | ORAL | Status: DC
  Administered 2012-10-28 – 2012-11-08 (×12): 10 mg via ORAL
  Filled 2012-10-28 (×13): qty 2

## 2012-10-28 MED ORDER — SODIUM CHLORIDE 0.9 % IJ SOLN
3.0000 mL | Freq: Two times a day (BID) | INTRAMUSCULAR | Status: DC
  Administered 2012-10-28 – 2012-11-08 (×16): 3 mL via INTRAVENOUS

## 2012-10-28 MED ORDER — INSULIN ASPART 100 UNIT/ML ~~LOC~~ SOLN
0.0000 [IU] | Freq: Three times a day (TID) | SUBCUTANEOUS | Status: DC
  Administered 2012-10-28 – 2012-10-30 (×5): 2 [IU] via SUBCUTANEOUS
  Administered 2012-10-30 (×2): 1 [IU] via SUBCUTANEOUS
  Administered 2012-11-01: 3 [IU] via SUBCUTANEOUS
  Administered 2012-11-01: 13:00:00 1 [IU] via SUBCUTANEOUS
  Administered 2012-11-02: 2 [IU] via SUBCUTANEOUS
  Administered 2012-11-02 – 2012-11-03 (×5): 1 [IU] via SUBCUTANEOUS
  Administered 2012-11-05: 2 [IU] via SUBCUTANEOUS
  Administered 2012-11-05: 1 [IU] via SUBCUTANEOUS
  Administered 2012-11-05: 2 [IU] via SUBCUTANEOUS
  Administered 2012-11-06 – 2012-11-07 (×4): 1 [IU] via SUBCUTANEOUS
  Administered 2012-11-07: 2 [IU] via SUBCUTANEOUS
  Administered 2012-11-08 (×2): 1 [IU] via SUBCUTANEOUS

## 2012-10-28 MED ORDER — ONDANSETRON HCL 4 MG PO TABS: 4.0000 mg | ORAL_TABLET | Freq: Four times a day (QID) | ORAL | Status: DC | PRN

## 2012-10-28 MED ORDER — MOMETASONE FURO-FORMOTEROL FUM 200-5 MCG/ACT IN AERO
2.0000 | INHALATION_SPRAY | Freq: Two times a day (BID) | RESPIRATORY_TRACT | Status: DC
  Administered 2012-10-28 – 2012-11-08 (×21): 2 via RESPIRATORY_TRACT
  Filled 2012-10-28 (×2): qty 8.8

## 2012-10-28 MED ORDER — INFLUENZA VAC SPLIT QUAD 0.5 ML IM SUSP
0.5000 mL | INTRAMUSCULAR | Status: AC
  Administered 2012-10-29: 0.5 mL via INTRAMUSCULAR
  Filled 2012-10-28 (×2): qty 0.5

## 2012-10-28 NOTE — ED Provider Notes (Signed)
Medical screening examination/treatment/procedure(s) were performed by non-physician practitioner and as supervising physician I was immediately available for consultation/collaboration.   Dequavion Follette, MD 10/28/12 0643 

## 2012-10-28 NOTE — Progress Notes (Signed)
Patient's niece calling to report that patient was admitted to [facility]. Hospital last night.  patient slid off of the couch, and had an elevated temp. Note to dr Clelia Croft.

## 2012-10-28 NOTE — Progress Notes (Signed)
Pt seen and examined at bedside. He is hemodynamically stable but speech is somewhat slow. Awaiting for neurology recommendations. Please see earlier admission note by Dr. Toniann Fail.   Debbora Presto, MD  [facility] Pager [phone]  If 7PM-7AM, please contact night-coverage [email or web address] Password [email or web address]

## 2012-10-28 NOTE — H&P (Addendum)
[facility] History and Physical  [name] ZOX:[id number] DOB: 09-12-1932 DOA: 10/27/2012  Referring physician: ER physician. PCP: Sandrea Hughs, MD  Specialists: Dr. Clelia Croft. Oncologist.  Chief Complaint: Weakness.  HPI: [name] is a 79 y.o. male with history of lymphoma in remission and pancytopenia, diabetes mellitus type 2 presents to the ER because of increasing weakness. Patient states that yesterday had gone to the back following which he started feeling increasingly weak and difficulty to walk. He has been having lower abdominal pain for last 3 weeks with back pain. Over the last one week patient also has had mild frontal headache. Patient has periods of confusion but presently is oriented. Patient denies any nausea vomiting diarrhea fever chills chest pain or shortness of breath prior to admission. He denies any incontinence of urine or bowel. In the ER patient was found to be mildly febrile. UA was unremarkable. Chest x-ray did not show any infiltrates. While examining patient became mildly short of breath. Lactic acid levels are found to be high. ABG is unremarkable otherwise. Patient has been admitted for further management.  Review of Systems: As presented in the history of presenting illness, rest negative.  Past Medical History  Diagnosis Date  . Dyspnea   . 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   . Allergic rhinitis   . Hyperlipidemia   . Exogenous obesity   . Edema     venous insufficiency  . Diabetes mellitus 2012    T2DM  . Second degree Mobitz II AV block 12/11/08    with transient syncope, resolved s/p PPM  . Paroxysmal atrial fibrillation 03/16/11    diagnosed by PPM interrogation  . Pancytopenia   . Postural dizziness    Past Surgical History  Procedure Laterality Date  . Lymph node biopsy      groin  . Sternotomy  2000  . Pericardiectomy  2000  . Penile prosthesis implant       and removal  . Pacemaker insertion  12/11/08    MDT implanted by Dr Johney Frame   Social History:  reports that he quit smoking about 44 years ago. His smoking use included Cigarettes. He has a 30 pack-year smoking history. He has quit using smokeless tobacco. He reports that he does not drink alcohol or use illicit drugs. Home. where does patient live-- Yes. Can patient participate in ADLs?  No Known Allergies  Family History  Problem Relation Age of Onset  . Diabetes Mother   . Liver cancer Brother   . Cancer Sister       Prior to Admission medications   Medication Sig Start Date End Date Taking? Authorizing Provider  acetaminophen (TYLENOL) 500 MG tablet Take 500-1,000 mg by mouth every 6 (six) hours as needed.    Yes Historical Provider, MD  albuterol (PROVENTIL HFA;VENTOLIN HFA) 108 (90 BASE) MCG/ACT inhaler Inhale 2 puffs into the lungs every 4 (four) hours as needed for wheezing or shortness of breath.    Yes Historical Provider, MD  aspirin 81 MG tablet Take 81 mg by mouth every morning. (hold if bleeding)   Yes Historical Provider, MD  calcium-vitamin D (OS-CAL 500 + D) 500-200 MG-UNIT per tablet Take 1 tablet by mouth 2 (two) times daily.     Yes Historical Provider, MD  cetirizine (ZYRTEC) 10 MG tablet Take 10 mg by mouth at bedtime as needed for allergies.   Yes Historical Provider, MD  Cholecalciferol (  VITAMIN D) 1000 UNITS capsule Take 1,000 Units by mouth 2 (two) times daily.     Yes Historical Provider, MD  dextromethorphan-guaiFENesin (MUCINEX DM) 30-600 MG per 12 hr tablet Take 1 tablet by mouth every 12 (twelve) hours as needed (cough).  06/26/10  Yes Tammy S Parrett, NP  escitalopram (LEXAPRO) 20 MG tablet Take 10 mg by mouth at bedtime.    Yes Historical Provider, MD  famotidine (PEPCID) 20 MG tablet Take 20 mg by mouth at bedtime.    Yes Historical Provider, MD  finasteride (PROSCAR) 5 MG tablet Take 5 mg by mouth every morning.    Yes Historical Provider, MD  furosemide  (LASIX) 20 MG tablet Take 20 mg by mouth daily. May take 1 extra daily if needed for swelling in legs 07/12/12  Yes Nyoka Cowden, MD  hydrocortisone cream 1 % Apply 1 application topically 2 (two) times daily as needed (itchy rash).    Yes Historical Provider, MD  metFORMIN (GLUCOPHAGE) 500 MG tablet Take 500 mg by mouth 2 (two) times daily with a meal.  06/23/11  Yes Tammy S Parrett, NP  methylcellulose (CITRUCEL) oral powder 1 scoop in 8oz of water at bedtime 06/26/10  Yes Tammy S Parrett, NP  mometasone (NASONEX) 50 MCG/ACT nasal spray Place 2 sprays into the nose 2 (two) times daily.  02/23/11  Yes Tammy S Parrett, NP  mometasone-formoterol (DULERA) 200-5 MCG/ACT AERO Inhale 2 puffs into the lungs 2 (two) times daily.   Yes Historical Provider, MD  Multiple Vitamin (MULTIVITAMIN) tablet Take 1 tablet by mouth daily.     Yes Historical Provider, MD  omeprazole (PRILOSEC) 20 MG capsule Take 40 mg by mouth daily before breakfast.    Yes Historical Provider, MD  oxybutynin (DITROPAN) 5 MG tablet Take 10 mg by mouth every morning.    Yes Historical Provider, MD  polyethylene glycol powder (MIRALAX) powder Take 17 g by mouth at bedtime.    Yes Historical Provider, MD  potassium chloride SA (K-DUR,KLOR-CON) 20 MEQ tablet Take 20 mEq by mouth daily.   Yes Historical Provider, MD  sulindac (CLINORIL) 200 MG tablet Take 200 mg by mouth 2 (two) times daily as needed (pain).  06/24/12  Yes Nyoka Cowden, MD  traMADol (ULTRAM) 50 MG tablet Take 50-100 mg by mouth every 6 (six) hours as needed for pain (cough).   Yes Historical Provider, MD  traZODone (DESYREL) 50 MG tablet Take 50 mg by mouth at bedtime.   Yes Historical Provider, MD   Physical Exam: Filed Vitals:   10/27/12 2246 10/27/12 2247 10/28/12 0004  BP: 151/68    Pulse: 98    Temp: 99 F (37.2 C)  100.9 F (38.3 C)  TempSrc: Oral  Rectal  Resp: 20    SpO2: 96% 97%      General:  Well-developed well-nourished.  Eyes: Anicteric no  pallor.  ENT: No discharge from ears eyes nose mouth.  Neck: No neck rigidity.  Cardiovascular: S1-S2 heard.  Respiratory: No rhonchi or crepitations.  Abdomen: Soft nontender bowel sounds present.  Skin: Patient has chronic skin rash for which patient has been following with a dermatologist.  Musculoskeletal: No edema.  Psychiatric: Appears normal.  Neurologic: Alert awake oriented to time place and person. Moves all extremities.  Labs on Admission:  Basic Metabolic Panel:  Recent Labs Lab 10/27/12 2330  NA 131*  K 3.7  CL 93*  CO2 25  GLUCOSE 166*  BUN 15  CREATININE 0.86  CALCIUM  9.4   Liver Function Tests:  Recent Labs Lab 10/27/12 2330  AST 18  ALT 17  ALKPHOS 50  BILITOT 1.5*  PROT 6.7  ALBUMIN 3.9   No results found for this basename: LIPASE, AMYLASE,  in the last 168 hours No results found for this basename: AMMONIA,  in the last 168 hours CBC:  Recent Labs Lab 10/27/12 2330  WBC 1.8*  NEUTROABS 1.6*  HGB 8.9*  HCT 25.6*  MCV 105.8*  PLT 97*   Cardiac Enzymes:  Recent Labs Lab 10/28/12 0210  CKTOTAL 92  TROPONINI <0.30    BNP (last 3 results)  Recent Labs  10/28/12 0210  PROBNP 1971.0*   CBG: No results found for this basename: GLUCAP,  in the last 168 hours  Radiological Exams on Admission: Dg Chest 2 View  10/28/2012   CLINICAL DATA:  Shortness of Breath.  EXAM: CHEST  2 VIEW  COMPARISON:  09/30/2012  FINDINGS: Left pacer remains in place, unchanged. Prior median sternotomy and CABG. Cardiomegaly with vascular congestion. No overt edema, effusions or acute bony abnormality.  IMPRESSION: Cardiomegaly, vascular congestion.   Electronically Signed   By: Charlett Nose M.D.   On: 10/28/2012 02:28    EKG: Independently reviewed. Paced rhythm.  Assessment/Plan Principal Problem:   Weakness Active Problems:   Fever   Abdominal pain   Lactic acidosis   1. Weakness with fever - cause for the mild fever is not known. At  this time blood cultures have been ordered and patient has been placed on empiric antibiotics. Since patient has been complaining of mild shortness of breath abdominal pain I have ordered CT of the head chest and abdomen which are pending.If CT is unremarkable then check thoracic and lumbar spine xray for fracture.Continue with hydration. Recheck lactic acid levels after hydration. Will reassess patient after the tests are done. 2. History of lymphoma under remission with pancytopenia - closely follow complete blood count with differentials. 3. Diabetes mellitus type 2 - due to lactic acidosis we will hold off metformin. Closely follow CBGs with sliding-scale coverage. 4. History of CAD status post CABG and pacemaker placement - check cardiac markers. Patient denies any chest pain.    Code Status: Full code.  Family Communication: Patient's daughter at the bedside.  Disposition Plan: Admit to inpatient.    KAKRAKANDY,ARSHAD N. [facility] Pager [phone].  If 7PM-7AM, please contact night-coverage [email or web address] Password [email or web address] 10/28/2012, 3:32 AM

## 2012-10-28 NOTE — Progress Notes (Signed)
ANTIBIOTIC CONSULT NOTE - INITIAL  Pharmacy Consult for Vancomycin/cefepime Indication: Fever  No Known Allergies  Patient Measurements: Height: 5\' 11"  (180.3 cm) Weight: 253 lb 1.4 oz (114.8 kg) IBW/kg (Calculated) : 75.3 Adjusted Body Weight:   Vital Signs: Temp: 99.7 F (37.6 C) (10/03 0530) Temp src: Oral (10/03 0530) BP: 130/73 mmHg (10/03 0530) Pulse Rate: 117 (10/03 0530) Intake/Output from previous day:   Intake/Output from this shift:    Labs:  Recent Labs  10/27/12 2330  WBC 1.8*  HGB 8.9*  PLT 97*  CREATININE 0.86   Estimated Creatinine Clearance: 91.2 ml/min (by C-G formula based on Cr of 0.86). No results found for this basename: VANCOTROUGH, VANCOPEAK, VANCORANDOM, GENTTROUGH, GENTPEAK, GENTRANDOM, TOBRATROUGH, TOBRAPEAK, TOBRARND, AMIKACINPEAK, AMIKACINTROU, AMIKACIN,  in the last 72 hours   Microbiology: No results found for this or any previous visit (from the past 720 hour(s)).  Medical History: Past Medical History  Diagnosis Date  . Dyspnea   . 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   . Allergic rhinitis   . Hyperlipidemia   . Exogenous obesity   . Edema     venous insufficiency  . Diabetes mellitus 2012    T2DM  . Second degree Mobitz II AV block 12/11/08    with transient syncope, resolved s/p PPM  . Paroxysmal atrial fibrillation 03/16/11    diagnosed by PPM interrogation  . Pancytopenia   . Postural dizziness     Medications:  Anti-infectives   Start     Dose/Rate Route Frequency Ordered Stop   10/28/12 0800  ceFEPIme (MAXIPIME) 2 g in dextrose 5 % 50 mL IVPB     2 g 100 mL/hr over 30 Minutes Intravenous Every 8 hours 10/28/12 0607     10/28/12 0630  vancomycin (VANCOCIN) 1,250 mg in sodium chloride 0.9 % 250 mL IVPB     1,250 mg 166.7 mL/hr over 90 Minutes Intravenous 2 times daily 10/28/12 0607     10/28/12 0145  ceFEPIme (MAXIPIME) 2 g in dextrose 5 % 50 mL IVPB     2 g 100 mL/hr over 30 Minutes Intravenous  Once 10/28/12 0130 10/28/12 0255     Assessment: Patient with fever.    Goal of Therapy:  Vancomycin trough level 15-20 mcg/ml Cefepime dosed based on patient weight and renal function   Plan:  Measure antibiotic drug levels at steady state Follow up culture results Vancomycin 1250mg  iv q12hr, cefepime 2gm iv q8hr  Darlina Guys, [place] Luddy Crowford 10/28/2012,6:09 AM

## 2012-10-28 NOTE — Progress Notes (Signed)
CRITICAL VALUE ALERT  Critical value received:  4 blood cultures- 2 aerobic and 2 anaerobic- both had gram positive cocci in clusters  Date of notification:  10/28/12  Time of notification:  2206  Critical value read back:yes  Nurse who received alert:  Linward Natal, RN  MD notified (1st page):  Claiborne Billings  Time of first page:  2209  MD notified (2nd page):  Time of second page:  Responding MD:  Claiborne Billings  Time MD responded:  2215  Patient on multiple IV antibiotics already. No new orders given at this time.

## 2012-10-28 NOTE — Progress Notes (Signed)
Utilization review completed.  

## 2012-10-28 NOTE — Progress Notes (Addendum)
Reviewed CAT scan results of which are unremarkable. On reexamining patient looks more confused now. Patient follows commands and moves all extremities. He is oriented to his name and place. But goes into periods of confusion where he has some difficulty being out certain words and patient looks restless. At this time I have started patient on IV acyclovir to cover for possible encephalitis. I have consulted neurologist Dr. Roseanne Reno. May need lumbar puncture. MRI brain cannot be done as patient has pacemaker. We'll continue with vancomycin and cefepime along with acyclovir. Discuss with patient's daughter at the bedside. Patient also had 2 episodes of diarrhea. We will check stool cultures and C. Difficile. Check ammonia levels.  Midge Minium

## 2012-10-28 NOTE — ED Provider Notes (Signed)
CSN: [id number]     Arrival date & time 10/27/12  2235 History   First MD Initiated Contact with Patient 10/27/12 2239     Chief Complaint  Patient presents with  . Possible UTI    (Consider location/radiation/quality/duration/timing/severity/associated sxs/prior Treatment) HPI Patient presents to the emergency department with a one-week history of difficulty with urination and dark urine.  The patient also has had some mild mental impairment, according to the son.  Patient denies chest pain, shortness of breath, nausea, vomiting, diarrhea, bloody stool, weakness, numbness, dizziness, headache, back pain, neck pain, or syncope.  The patient, states, that nothing seems to make his condition, better or worse.  Patient did not take any medications prior to arrival for symptom   Past Surgical History  Procedure Laterality Date  . Lymph node biopsy      groin  . Sternotomy  2000  . Pericardiectomy  2000  . Penile prosthesis implant      and removal  . Pacemaker insertion  12/11/08    MDT implanted by Dr Johney Frame   Family History  Problem Relation Age of Onset  . Diabetes Mother   . Liver cancer Brother   . Cancer Sister    History  Substance Use Topics  . Smoking status: Former Smoker -- 1.00 packs/day for 30 years    Types: Cigarettes    Quit date: 01/27/1968  . Smokeless tobacco: Former Neurosurgeon  . Alcohol Use: No    Review of Systems All other systems negative except as documented in the HPI. All pertinent positives and negatives as reviewed in the HPI. Allergies  Review of patient's allergies indicates no known allergies.  Home Medications   Current Outpatient Rx  Name  Route  Sig  Dispense  Refill  . acetaminophen (TYLENOL) 500 MG tablet   Oral   Take 500-1,000 mg by mouth every 6 (six) hours as needed.          [name] albuterol (PROVENTIL HFA;VENTOLIN HFA) 108 (90 BASE) MCG/ACT inhaler   Inhalation   Inhale 2 puffs into the lungs every 4 (four) hours as needed for  wheezing or shortness of breath.          [name] aspirin 81 MG tablet   Oral   Take 81 mg by mouth every morning. (hold if bleeding)         . calcium-vitamin D (OS-CAL 500 + D) 500-200 MG-UNIT per tablet   Oral   Take 1 tablet by mouth 2 (two) times daily.           . cetirizine (ZYRTEC) 10 MG tablet   Oral   Take 10 mg by mouth at bedtime as needed for allergies.         . Cholecalciferol (VITAMIN D) 1000 UNITS capsule   Oral   Take 1,000 Units by mouth 2 (two) times daily.           [name] dextromethorphan-guaiFENesin (MUCINEX DM) 30-600 MG per 12 hr tablet   Oral   Take 1 tablet by mouth every 12 (twelve) hours as needed (cough).          . escitalopram (LEXAPRO) 20 MG tablet   Oral   Take 10 mg by mouth at bedtime.          . famotidine (PEPCID) 20 MG tablet   Oral   Take 20 mg by mouth at bedtime.          . finasteride (PROSCAR) 5 MG tablet  Oral   Take 5 mg by mouth every morning.          . furosemide (LASIX) 20 MG tablet   Oral   Take 20 mg by mouth daily. May take 1 extra daily if needed for swelling in legs         . hydrocortisone cream 1 %   Topical   Apply 1 application topically 2 (two) times daily as needed (itchy rash).          . metFORMIN (GLUCOPHAGE) 500 MG tablet   Oral   Take 500 mg by mouth 2 (two) times daily with a meal.          . methylcellulose (CITRUCEL) oral powder      1 scoop in 8oz of water at bedtime         . mometasone (NASONEX) 50 MCG/ACT nasal spray   Nasal   Place 2 sprays into the nose 2 (two) times daily.          . mometasone-formoterol (DULERA) 200-5 MCG/ACT AERO   Inhalation   Inhale 2 puffs into the lungs 2 (two) times daily.         . Multiple Vitamin (MULTIVITAMIN) tablet   Oral   Take 1 tablet by mouth daily.           [name] omeprazole (PRILOSEC) 20 MG capsule   Oral   Take 40 mg by mouth daily before breakfast.          . oxybutynin (DITROPAN) 5 MG tablet   Oral   Take 10 mg by  mouth every morning.          . polyethylene glycol powder (MIRALAX) powder   Oral   Take 17 g by mouth at bedtime.          . potassium chloride SA (K-DUR,KLOR-CON) 20 MEQ tablet   Oral   Take 20 mEq by mouth daily.         . sulindac (CLINORIL) 200 MG tablet   Oral   Take 200 mg by mouth 2 (two) times daily as needed (pain).          . traMADol (ULTRAM) 50 MG tablet   Oral   Take 50-100 mg by mouth every 6 (six) hours as needed for pain (cough).         . traZODone (DESYREL) 50 MG tablet   Oral   Take 50 mg by mouth at bedtime.          BP 151/68  Pulse 98  Temp(Src) 100.9 F (38.3 C) (Rectal)  Resp 20  SpO2 97% Physical Exam  Nursing note and vitals reviewed. Constitutional: He appears well-developed and well-nourished. No distress.  HENT:  Head: Normocephalic and atraumatic.  Mouth/Throat: Oropharynx is clear and moist.  Eyes: Pupils are equal, round, and reactive to light.  Neck: Normal range of motion. Neck supple.  Cardiovascular: Normal rate, regular rhythm and normal heart sounds.  Exam reveals no gallop and no friction rub.   No murmur heard. Pulmonary/Chest: Effort normal and breath sounds normal. No respiratory distress.  Abdominal: Soft. Normal appearance and bowel sounds are normal. He exhibits no distension. There is tenderness in the suprapubic area. There is no rigidity, no rebound and no guarding.  Musculoskeletal: He exhibits no edema.  Neurological: He is alert. He exhibits normal muscle tone. Coordination normal.  Skin: Skin is warm and dry. No rash noted. No erythema.    ED Course  Procedures (including  critical care time) Labs Review Labs Reviewed  CBC WITH DIFFERENTIAL - Abnormal; Notable for the following:    WBC 1.8 (*)    RBC 2.42 (*)    Hemoglobin 8.9 (*)    HCT 25.6 (*)    MCV 105.8 (*)    MCH 36.8 (*)    RDW 17.4 (*)    Platelets 97 (*)    Neutrophils Relative % 88 (*)    Neutro Abs 1.6 (*)    Lymphocytes Relative  11 (*)    Lymphs Abs 0.2 (*)    Monocytes Relative 1 (*)    Monocytes Absolute 0.0 (*)    All other components within normal limits  COMPREHENSIVE METABOLIC PANEL - Abnormal; Notable for the following:    Sodium 131 (*)    Chloride 93 (*)    Glucose, Bld 166 (*)    Total Bilirubin 1.5 (*)    GFR calc non Af Amer 81 (*)    All other components within normal limits  CG4 I-STAT (LACTIC ACID) - Abnormal; Notable for the following:    Lactic Acid, Venous 4.77 (*)    All other components within normal limits  URINALYSIS, ROUTINE W REFLEX MICROSCOPIC    Date: 10/28/2012  Rate: 108  Rhythm: Ventricular Paced  QRS Axis: normal  Intervals: normal  ST/T Wave abnormalities: normal  Conduction Disutrbances:none  Narrative Interpretation:   Old EKG Reviewed: none available   MDM      Carlyle Dolly, PA-C 10/28/12 0107

## 2012-10-28 NOTE — Consult Note (Addendum)
NEURO HOSPITALIST CONSULT NOTE    Reason for Consult: weakness and confusion.   HPI:                                                                                                                                          [name] is an 79 y.o. male with history of lymphoma diagnosed in 2007 and in remission, S/P CHOP and Rituximab. Patient was his normal mentation and physical ability yesterday.  Son states he and his father were driving from bank to bank to get his financial affairs in order.  His son dropped him off in the afternoon and later patient came over to his house for dinner.  Upon arriving, the son noted his father looked very tired and weak.  His father sat down and then was unable to stand up doe to weakness and at one point was so weak he was unable to lift his arms. Last nigh while in the hospital his mentation declined.  He was repeating words and repeatedly counting to 25. Currently he is awake, oriented to place, recalls going to his sons and having "jumbilia" but has a very hard time getting his thoughts out. He is moving all his extremities 5/5 antigravity and purposefully.  Ann Maki stated that yesterdays events were not excessive and abnormal for him and should not have made him tired.   Patient has been started on Acyclovir, Cefepime and Vanc.   Past Medical History  Diagnosis Date  . Dyspnea   . 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   . Allergic rhinitis   . Hyperlipidemia   . Exogenous obesity   . Edema     venous insufficiency  . Diabetes mellitus 2012    T2DM  . Second degree Mobitz II AV block 12/11/08    with transient syncope, resolved s/p PPM  . Paroxysmal atrial fibrillation 03/16/11    diagnosed by PPM interrogation  . Pancytopenia   . Postural dizziness     Past Surgical History  Procedure Laterality Date  . Lymph node biopsy      groin  . Sternotomy  2000  .  Pericardiectomy  2000  . Penile prosthesis implant      and removal  . Pacemaker insertion  12/11/08    MDT implanted by Dr Johney Frame    Family History  Problem Relation Age of Onset  . Diabetes Mother   . Liver cancer Brother   . Cancer Sister      Social History:  reports that he quit smoking about 44 years ago. His smoking use included Cigarettes. He has a 30 pack-year smoking history. He has quit using smokeless tobacco. He reports that he does not drink  alcohol or use illicit drugs.  No Known Allergies  MEDICATIONS:                                                                                                                     Prior to Admission:  Prescriptions prior to admission  Medication Sig Dispense Refill  . acetaminophen (TYLENOL) 500 MG tablet Take 500-1,000 mg by mouth every 6 (six) hours as needed.       [name] albuterol (PROVENTIL HFA;VENTOLIN HFA) 108 (90 BASE) MCG/ACT inhaler Inhale 2 puffs into the lungs every 4 (four) hours as needed for wheezing or shortness of breath.       [name] aspirin 81 MG tablet Take 81 mg by mouth every morning. (hold if bleeding)      . calcium-vitamin D (OS-CAL 500 + D) 500-200 MG-UNIT per tablet Take 1 tablet by mouth 2 (two) times daily.        . cetirizine (ZYRTEC) 10 MG tablet Take 10 mg by mouth at bedtime as needed for allergies.      . Cholecalciferol (VITAMIN D) 1000 UNITS capsule Take 1,000 Units by mouth 2 (two) times daily.        [name] dextromethorphan-guaiFENesin (MUCINEX DM) 30-600 MG per 12 hr tablet Take 1 tablet by mouth every 12 (twelve) hours as needed (cough).       . escitalopram (LEXAPRO) 20 MG tablet Take 10 mg by mouth at bedtime.       . famotidine (PEPCID) 20 MG tablet Take 20 mg by mouth at bedtime.       . finasteride (PROSCAR) 5 MG tablet Take 5 mg by mouth every morning.       . furosemide (LASIX) 20 MG tablet Take 20 mg by mouth daily. May take 1 extra daily if needed for swelling in legs      . hydrocortisone cream 1  % Apply 1 application topically 2 (two) times daily as needed (itchy rash).       . metFORMIN (GLUCOPHAGE) 500 MG tablet Take 500 mg by mouth 2 (two) times daily with a meal.       . methylcellulose (CITRUCEL) oral powder 1 scoop in 8oz of water at bedtime      . mometasone (NASONEX) 50 MCG/ACT nasal spray Place 2 sprays into the nose 2 (two) times daily.       . mometasone-formoterol (DULERA) 200-5 MCG/ACT AERO Inhale 2 puffs into the lungs 2 (two) times daily.      . Multiple Vitamin (MULTIVITAMIN) tablet Take 1 tablet by mouth daily.        [name] omeprazole (PRILOSEC) 20 MG capsule Take 40 mg by mouth daily before breakfast.       . oxybutynin (DITROPAN) 5 MG tablet Take 10 mg by mouth every morning.       . polyethylene glycol powder (MIRALAX) powder Take 17 g by mouth at bedtime.       . potassium chloride SA (K-DUR,KLOR-CON) 20 MEQ tablet Take 20 mEq  by mouth daily.      . sulindac (CLINORIL) 200 MG tablet Take 200 mg by mouth 2 (two) times daily as needed (pain).       . traMADol (ULTRAM) 50 MG tablet Take 50-100 mg by mouth every 6 (six) hours as needed for pain (cough).      . traZODone (DESYREL) 50 MG tablet Take 50 mg by mouth at bedtime.       Scheduled: . acyclovir  760 mg Intravenous Q8H  . albuterol  2.5 mg Nebulization Q4H  . ceFEPime (MAXIPIME) IV  2 g Intravenous Q8H  . escitalopram  10 mg Oral QHS  . famotidine  20 mg Oral QHS  . finasteride  5 mg Oral q morning - 10a  . fluticasone  2 spray Each Nare Daily  . [START ON 10/29/2012] influenza vac split quadrivalent PF  0.5 mL Intramuscular Tomorrow-1000  . insulin aspart  0-9 Units Subcutaneous TID WC  . loratadine  10 mg Oral Daily  . mometasone-formoterol  2 puff Inhalation BID  . oxybutynin  10 mg Oral q morning - 10a  . pantoprazole  40 mg Oral Daily  . [START ON 10/29/2012] pneumococcal 23 valent vaccine  0.5 mL Intramuscular Tomorrow-1000  . sodium chloride  3 mL Intravenous Q12H  . traZODone  50 mg Oral QHS  .  vancomycin  1,250 mg Intravenous BID     ROS:                                                                                                                                       History obtained from daughter and son  General ROS: negative for - chills, fatigue, fever, night sweats, weight gain or weight loss Psychological ROS: negative for - behavioral disorder, hallucinations, memory difficulties, mood swings or suicidal ideation Ophthalmic ROS: negative for - blurry vision, double vision, eye pain or loss of vision ENT ROS: negative for - epistaxis, nasal discharge, oral lesions, sore throat, tinnitus or vertigo Allergy and Immunology ROS: negative for - hives or itchy/watery eyes Hematological and Lymphatic ROS: negative for - bleeding problems, bruising or swollen lymph nodes Endocrine ROS: negative for - galactorrhea, hair pattern changes, polydipsia/polyuria or temperature intolerance Respiratory ROS: negative for - cough, hemoptysis, shortness of breath or wheezing Cardiovascular ROS: negative for - chest pain, dyspnea on exertion, edema or irregular heartbeat Gastrointestinal ROS: negative for - abdominal pain, diarrhea, hematemesis, nausea/vomiting or stool incontinence Genito-Urinary ROS: negative for - dysuria, hematuria, incontinence or urinary frequency/urgency Musculoskeletal ROS: negative for - joint swelling or muscular weakness Neurological ROS: as noted in HPI Dermatological ROS: negative for rash and skin lesion changes   Blood pressure 130/73, pulse 117, temperature 99.7 F (37.6 C), temperature source Oral, resp. rate 28, height 5\' 11"  (1.803 m), weight 114.8 kg (253 lb 1.4 oz), SpO2 94.00%.   Neurologic Examination:  Mental Status: Alert, oriented to place, thought content slow to produce and does not follow a fluid train of though.  Speech fluent without evidence  of aphasia.  Able to follow 3 step commands without difficulty. Cranial Nerves: II: Discs flat bilaterally; Visual fields grossly normal, pupils equal, round, reactive to light and accommodation III,IV, VI: ptosis not present, extra-ocular motions intact bilaterally V,VII: smile symmetric, facial light touch sensation normal bilaterally VIII: hearing normal bilaterally IX,X: gag reflex present XI: bilateral shoulder shrug XII: midline tongue extension Motor: Right : Upper extremity   5/5    Left:     Upper extremity   5/5  Lower extremity   5/5     Lower extremity   5/5 Tone and bulk:normal tone throughout; no atrophy noted Sensory: Pinprick and light touch intact throughout, bilaterally Deep Tendon Reflexes:  Right: Upper Extremity   Left: Upper extremity   biceps (C-5 to C-6) 2/4   biceps (C-5 to C-6) 2/4 tricep (C7) 2/4    triceps (C7) 2/4 Brachioradialis (C6) 2/4  Brachioradialis (C6) 2/4  Lower Extremity Lower Extremity  quadriceps (L-2 to L-4) 1/4   quadriceps (L-2 to L-4) 1/4 Achilles (S1) 1/4   Achilles (S1) 1/4  Plantars: Right: downgoing   Left: downgoing Cerebellar: normal finger-to-nose,  normal heel-to-shin test Gait: not examined CV: pulses palpable throughout    Lab Results  Component Value Date/Time   CHOL 142 11/08/2007 12:00 AM    Results for orders placed during the hospital encounter of 10/27/12 (from the past 48 hour(s))  CBC WITH DIFFERENTIAL     Status: Abnormal   Collection Time    10/27/12 11:30 PM      Result Value Range   WBC 1.8 (*) 4.0 - 10.5 K/uL   RBC 2.42 (*) 4.22 - 5.81 MIL/uL   Hemoglobin 8.9 (*) 13.0 - 17.0 g/dL   HCT 16.1 (*) 09.6 - 04.5 %   MCV 105.8 (*) 78.0 - 100.0 fL   MCH 36.8 (*) 26.0 - 34.0 pg   MCHC 34.8  30.0 - 36.0 g/dL   RDW 40.9 (*) 81.1 - 91.4 %   Platelets 97 (*) 150 - 400 K/uL   Comment: SPECIMEN CHECKED FOR CLOTS     REPEATED TO VERIFY     PLATELET COUNT CONFIRMED BY SMEAR   Neutrophils Relative % 88 (*) 43 -  77 %   Neutro Abs 1.6 (*) 1.7 - 7.7 K/uL   Lymphocytes Relative 11 (*) 12 - 46 %   Lymphs Abs 0.2 (*) 0.7 - 4.0 K/uL   Monocytes Relative 1 (*) 3 - 12 %   Monocytes Absolute 0.0 (*) 0.1 - 1.0 K/uL   Eosinophils Relative 0  0 - 5 %   Eosinophils Absolute 0.0  0.0 - 0.7 K/uL   Basophils Relative 0  0 - 1 %   Basophils Absolute 0.0  0.0 - 0.1 K/uL  COMPREHENSIVE METABOLIC PANEL     Status: Abnormal   Collection Time    10/27/12 11:30 PM      Result Value Range   Sodium 131 (*) 135 - 145 mEq/L   Potassium 3.7  3.5 - 5.1 mEq/L   Chloride 93 (*) 96 - 112 mEq/L   CO2 25  19 - 32 mEq/L   Glucose, Bld 166 (*) 70 - 99 mg/dL   BUN 15  6 - 23 mg/dL   Creatinine, Ser 7.82  0.50 - 1.35 mg/dL   Calcium 9.4  8.4 - 95.6  mg/dL   Total Protein 6.7  6.0 - 8.3 g/dL   Albumin 3.9  3.5 - 5.2 g/dL   AST 18  0 - 37 U/L   ALT 17  0 - 53 U/L   Alkaline Phosphatase 50  39 - 117 U/L   Total Bilirubin 1.5 (*) 0.3 - 1.2 mg/dL   GFR calc non Af Amer 81 (*) >90 mL/min   GFR calc Af Amer >90  >90 mL/min   Comment: (NOTE)     The eGFR has been calculated using the CKD EPI equation.     This calculation has not been validated in all clinical situations.     eGFR's persistently <90 mL/min signify possible Chronic Kidney     Disease.  CG4 I-STAT (LACTIC ACID)     Status: Abnormal   Collection Time    10/27/12 11:46 PM      Result Value Range   Lactic Acid, Venous 4.77 (*) 0.5 - 2.2 mmol/L  URINALYSIS, ROUTINE W REFLEX MICROSCOPIC     Status: Abnormal   Collection Time    10/28/12  1:00 AM      Result Value Range   Color, Urine YELLOW  YELLOW   APPearance CLOUDY (*) CLEAR   Specific Gravity, Urine 1.018  1.005 - 1.030   pH 6.0  5.0 - 8.0   Glucose, UA NEGATIVE  NEGATIVE mg/dL   Hgb urine dipstick NEGATIVE  NEGATIVE   Bilirubin Urine NEGATIVE  NEGATIVE   Ketones, ur 15 (*) NEGATIVE mg/dL   Protein, ur NEGATIVE  NEGATIVE mg/dL   Urobilinogen, UA 1.0  0.0 - 1.0 mg/dL   Nitrite NEGATIVE  NEGATIVE    Leukocytes, UA NEGATIVE  NEGATIVE   Comment: MICROSCOPIC NOT DONE ON URINES WITH NEGATIVE PROTEIN, BLOOD, LEUKOCYTES, NITRITE, OR GLUCOSE <1000 mg/dL.  CK     Status: None   Collection Time    10/28/12  2:10 AM      Result Value Range   Total CK 92  7 - 232 U/L  TROPONIN I     Status: None   Collection Time    10/28/12  2:10 AM      Result Value Range   Troponin I <0.30  <0.30 ng/mL   Comment:            Due to the release kinetics of cTnI,     a negative result within the first hours     of the onset of symptoms does not rule out     myocardial infarction with certainty.     If myocardial infarction is still suspected,     repeat the test at appropriate intervals.  PRO B NATRIURETIC PEPTIDE     Status: Abnormal   Collection Time    10/28/12  2:10 AM      Result Value Range   Pro B Natriuretic peptide (BNP) 1971.0 (*) 0 - 450 pg/mL  D-DIMER, QUANTITATIVE     Status: Abnormal   Collection Time    10/28/12  2:15 AM      Result Value Range   D-Dimer, Quant 0.68 (*) 0.00 - 0.48 ug/mL-FEU   Comment:            AT THE INHOUSE ESTABLISHED CUTOFF     VALUE OF 0.48 ug/mL FEU,     THIS ASSAY HAS BEEN DOCUMENTED     IN THE LITERATURE TO HAVE     A SENSITIVITY AND NEGATIVE     PREDICTIVE VALUE OF AT  LEAST     98 TO 99%.  THE TEST RESULT     SHOULD BE CORRELATED WITH     AN ASSESSMENT OF THE CLINICAL     PROBABILITY OF DVT / VTE.  BLOOD GAS, ARTERIAL     Status: Abnormal   Collection Time    10/28/12  2:30 AM      Result Value Range   FIO2 0.21     Delivery systems ROOM AIR     pH, Arterial 7.451 (*) 7.350 - 7.450   pCO2 arterial 29.5 (*) 35.0 - 45.0 mmHg   pO2, Arterial 93.5  80.0 - 100.0 mmHg   Bicarbonate 20.2  20.0 - 24.0 mEq/L   TCO2 19.0  0 - 100 mmol/L   Acid-base deficit 2.7 (*) 0.0 - 2.0 mmol/L   O2 Saturation 95.4     Patient temperature 98.6     Collection site LEFT RADIAL     Drawn by [id number]     Sample type ARTERIAL DRAW     Allens test (pass/fail) PASS  PASS   BASIC METABOLIC PANEL     Status: Abnormal   Collection Time    10/28/12  6:55 AM      Result Value Range   Sodium 127 (*) 135 - 145 mEq/L   Potassium 3.3 (*) 3.5 - 5.1 mEq/L   Chloride 91 (*) 96 - 112 mEq/L   CO2 21  19 - 32 mEq/L   Glucose, Bld 140 (*) 70 - 99 mg/dL   BUN 15  6 - 23 mg/dL   Creatinine, Ser 0.45  0.50 - 1.35 mg/dL   Calcium 8.6  8.4 - 40.9 mg/dL   GFR calc non Af Amer 80 (*) >90 mL/min   GFR calc Af Amer >90  >90 mL/min   Comment: (NOTE)     The eGFR has been calculated using the CKD EPI equation.     This calculation has not been validated in all clinical situations.     eGFR's persistently <90 mL/min signify possible Chronic Kidney     Disease.  CBC     Status: Abnormal   Collection Time    10/28/12  6:55 AM      Result Value Range   WBC 1.7 (*) 4.0 - 10.5 K/uL   RBC 2.23 (*) 4.22 - 5.81 MIL/uL   Hemoglobin 8.1 (*) 13.0 - 17.0 g/dL   HCT 81.1 (*) 91.4 - 78.2 %   MCV 104.9 (*) 78.0 - 100.0 fL   MCH 36.3 (*) 26.0 - 34.0 pg   MCHC 34.6  30.0 - 36.0 g/dL   RDW 95.6 (*) 21.3 - 08.6 %   Platelets 79 (*) 150 - 400 K/uL   Comment: CONSISTENT WITH PREVIOUS RESULT  LACTIC ACID, PLASMA     Status: Abnormal   Collection Time    10/28/12  6:55 AM      Result Value Range   Lactic Acid, Venous 5.4 (*) 0.5 - 2.2 mmol/L  HEPATIC FUNCTION PANEL     Status: Abnormal   Collection Time    10/28/12  6:55 AM      Result Value Range   Total Protein 5.9 (*) 6.0 - 8.3 g/dL   Albumin 3.3 (*) 3.5 - 5.2 g/dL   AST 16  0 - 37 U/L   ALT 15  0 - 53 U/L   Alkaline Phosphatase 38 (*) 39 - 117 U/L   Total Bilirubin 1.4 (*) 0.3 - 1.2 mg/dL  Bilirubin, Direct 0.5 (*) 0.0 - 0.3 mg/dL   Indirect Bilirubin 0.9  0.3 - 0.9 mg/dL  AMMONIA     Status: None   Collection Time    10/28/12  8:40 AM      Result Value Range   Ammonia 36  11 - 60 umol/L    Dg Chest 2 View  10/28/2012   CLINICAL DATA:  Shortness of Breath.  EXAM: CHEST  2 VIEW  COMPARISON:  09/30/2012  FINDINGS: Left  pacer remains in place, unchanged. Prior median sternotomy and CABG. Cardiomegaly with vascular congestion. No overt edema, effusions or acute bony abnormality.  IMPRESSION: Cardiomegaly, vascular congestion.   Electronically Signed   By: Charlett Nose M.D.   On: 10/28/2012 02:28   Dg Thoracic Spine 2 View  10/28/2012   CLINICAL DATA:  Back pain. No injury.  EXAM: THORACIC SPINE - 2 VIEW  COMPARISON:  Sagittal reconstructed image from a chest CT, 10/28/2012 and lateral chest radiograph, 07/03/2011.  FINDINGS: No fracture or spondylolisthesis.  Mild disk degenerative changes are noted along the mid and lower thoracic spine reflected by small anterior endplate osteophytes. This appearance is stable from the prior lateral chest radiograph. The bones are demineralized.  The surrounding soft tissues are unremarkable.  IMPRESSION: No fracture or spondylolisthesis.  Mild degenerative changes, stable from the prior lateral chest radiograph.   Electronically Signed   By: Amie Portland M.D.   On: 10/28/2012 08:28   Dg Lumbar Spine 2-3 Views  10/28/2012   CLINICAL DATA:  Back pain  EXAM: LUMBAR SPINE - 2-3 VIEW  COMPARISON:  None.  FINDINGS: No fracture or spondylolisthesis.  Moderate loss of disc height with endplate sclerosis and osteophytes is noted at L1-L2. Remaining lumbar disc spaces are well preserved.  There is a mild curvature of the upper lumbar spine, convex to the right.  The soft tissues are unremarkable.  IMPRESSION: No fracture or acute finding.  Disk degenerative changes at L1-L2.  Mild dextroscoliosis.   Electronically Signed   By: Amie Portland M.D.   On: 10/28/2012 08:25   Ct Head Wo Contrast  10/28/2012   *RADIOLOGY REPORT*  Clinical Data: Confusion  CT HEAD WITHOUT CONTRAST  Technique:  Contiguous axial images were obtained from the base of the skull through the vertex without contrast.  Comparison: Prior CT from 11/10/2008  Findings: There is no acute intracranial hemorrhage or infarct.  No mass  or midline shift.  Mild hypo attenuation within the periventricular white matter is similar to prior exam, and most consistent with chronic small to some ischemic. Calcification along the quadrigeminal plate is unchanged.  CSF containing spaces are stable with asymmetric enlargement of the left lateral ventricle. No extra-axial fluid collection.  Calvarium is intact.  Orbital soft tissues are within normal limits.  Scattered mucosal thickening is seen within the ethmoidal air cells bilaterally.  Mastoid air cells well pneumatized.  IMPRESSION: 1. Stable appearance of the the brain with no acute intra five.  2.  Ethmoidal sinus disease.   Original Report Authenticated By: Rise Mu, M.D.   Ct Angio Chest Pe W/cm &/or Wo Cm  10/28/2012   CLINICAL DATA:  Shortness of breath. Elevated D-dimer.  EXAM: CT ANGIOGRAPHY CHEST  CT ABDOMEN AND PELVIS WITH CONTRAST  TECHNIQUE: Multidetector CT imaging of the chest was performed using the standard protocol during bolus administration of intravenous contrast. Multiplanar CT image reconstructions including MIPs were obtained to evaluate the vascular anatomy. Multidetector CT imaging of the abdomen  and pelvis was performed using the standard protocol during bolus administration of intravenous contrast.  CONTRAST:  OMNIPAQUE IOHEXOL 350 MG/ML SOLN  COMPARISON:  PET CT 07/02/2008  FINDINGS: CTA CHEST FINDINGS  No filling defects in the pulmonary arteries to suggest pulmonary emboli. Left-sided pacer is in place. Cardiomegaly. There are pericardial calcifications posteriorly. Scattered coronary artery calcifications. Prior CABG moderate-sized hiatal hernia. No mediastinal, hilar, or axillary adenopathy.  Areas of scarring/ fibrosis peripherally in the upper lobes. No confluent airspace opacities or effusions.  No acute bony abnormality.  CT ABDOMEN and PELVIS FINDINGS  Liver, spleen, pancreas, gallbladder, adrenals are unremarkable. Small bilateral renal cysts. No  hydronephrosis.  Stomach and small bowel are decompressed. Right colonic wall appears mildly thickened with low-density appearance. This can be seen with old inflammatory bowel disease. No active inflammation currently. No free fluid, free air or adenopathy. Aorta and iliac vessels are normal caliber.  Small bilateral inguinal hernias containing fat. Urinary bladder and prostate grossly unremarkable. No acute bony abnormality.  Review of the MIP images confirms the above findings.  IMPRESSION: CTA CHEST IMPRESSION  No evidence of pulmonary embolus.  Cardiomegaly.  CT ABDOMEN and PELVIS IMPRESSION  No acute findings in the abdomen or pelvis.  Fat density within the wall of the right colon may reflect prior inflammatory bowel disease. No active inflammation or acute findings currently.   Electronically Signed   By: Charlett Nose M.D.   On: 10/28/2012 05:05   Ct Abdomen Pelvis W Contrast  10/28/2012   CLINICAL DATA:  Shortness of breath. Elevated D-dimer.  EXAM: CT ANGIOGRAPHY CHEST  CT ABDOMEN AND PELVIS WITH CONTRAST  TECHNIQUE: Multidetector CT imaging of the chest was performed using the standard protocol during bolus administration of intravenous contrast. Multiplanar CT image reconstructions including MIPs were obtained to evaluate the vascular anatomy. Multidetector CT imaging of the abdomen and pelvis was performed using the standard protocol during bolus administration of intravenous contrast.  CONTRAST:  OMNIPAQUE IOHEXOL 350 MG/ML SOLN  COMPARISON:  PET CT 07/02/2008  FINDINGS: CTA CHEST FINDINGS  No filling defects in the pulmonary arteries to suggest pulmonary emboli. Left-sided pacer is in place. Cardiomegaly. There are pericardial calcifications posteriorly. Scattered coronary artery calcifications. Prior CABG moderate-sized hiatal hernia. No mediastinal, hilar, or axillary adenopathy.  Areas of scarring/ fibrosis peripherally in the upper lobes. No confluent airspace opacities or effusions.  No  acute bony abnormality.  CT ABDOMEN and PELVIS FINDINGS  Liver, spleen, pancreas, gallbladder, adrenals are unremarkable. Small bilateral renal cysts. No hydronephrosis.  Stomach and small bowel are decompressed. Right colonic wall appears mildly thickened with low-density appearance. This can be seen with old inflammatory bowel disease. No active inflammation currently. No free fluid, free air or adenopathy. Aorta and iliac vessels are normal caliber.  Small bilateral inguinal hernias containing fat. Urinary bladder and prostate grossly unremarkable. No acute bony abnormality.  Review of the MIP images confirms the above findings.  IMPRESSION: CTA CHEST IMPRESSION  No evidence of pulmonary embolus.  Cardiomegaly.  CT ABDOMEN and PELVIS IMPRESSION  No acute findings in the abdomen or pelvis.  Fat density within the wall of the right colon may reflect prior inflammatory bowel disease. No active inflammation or acute findings currently.   Electronically Signed   By: Charlett Nose M.D.   On: 10/28/2012 05:05   Assessment and plan discussed with with attending physician and they are in agreement.    Felicie Morn PA-C [facility] [phone]  10/28/2012, 10:25 AM  I have seen and evaluated the patient. I have reviewed the above note and made appropriate changes.   79 year old male with confusion over the past 24 hours. Per his son, he does have some short-term memory problems at baseline. He also has electrolyte abnormalities.  Assessment/Plan: 79 year old male with confusion in the setting of low-grade fever. His LP does not show infflamation, and I have a low suspicion for CNS infection. Could await HSV by PCR for certainty prior to discontinuing acyclovir,  given the elevated protein, however in the setting of diabetes the significance of this becomes less clear. It is fairly possible that this represents a mild multifactorial delirium.  1) continue acyclovir until HSV by PCR returns 2) Will  continue to follow with you   Ritta Slot, MD [facility] [phone]  If 7pm- 7am, please page neurology on call at [phone].   Noted positive blood cultures. Discussed with Dr. Izola Price, I suspect mild delirium secondary to bacteremia. No further workup from my perspective at this time and neurology will sign off. Please call if any further questions remain.   Ritta Slot, MD [facility] [phone]  If 7pm- 7am, please page neurology on call at [phone].

## 2012-10-28 NOTE — ED Notes (Signed)
Per MD verbal order, waiting to start albuterol treatment until after ABG drawn.

## 2012-10-29 DIAGNOSIS — R109 Unspecified abdominal pain: Secondary | ICD-10-CM

## 2012-10-29 LAB — CBC
HCT: 21.1 % — ABNORMAL LOW (ref 39.0–52.0)
Hemoglobin: 7.5 g/dL — ABNORMAL LOW (ref 13.0–17.0)
MCH: 37.1 pg — ABNORMAL HIGH (ref 26.0–34.0)
MCV: 104.5 fL — ABNORMAL HIGH (ref 78.0–100.0)
RDW: 18 % — ABNORMAL HIGH (ref 11.5–15.5)
WBC: 1.6 10*3/uL — ABNORMAL LOW (ref 4.0–10.5)

## 2012-10-29 LAB — BASIC METABOLIC PANEL
BUN: 18 mg/dL (ref 6–23)
Calcium: 8.3 mg/dL — ABNORMAL LOW (ref 8.4–10.5)
Chloride: 94 mEq/L — ABNORMAL LOW (ref 96–112)
Creatinine, Ser: 0.87 mg/dL (ref 0.50–1.35)
GFR calc Af Amer: >90 mL/min (ref >90)

## 2012-10-29 LAB — HERPES SIMPLEX VIRUS(HSV) DNA BY PCR
HSV 1 DNA: NOT DETECTED
HSV 2 DNA: NOT DETECTED

## 2012-10-29 LAB — GLUCOSE, CAPILLARY
Glucose-Capillary: 160 mg/dL — ABNORMAL HIGH (ref 70–99)
Glucose-Capillary: 158 mg/dL — ABNORMAL HIGH (ref 70–99)
Glucose-Capillary: 143 mg/dL — ABNORMAL HIGH (ref 70–99)

## 2012-10-29 LAB — PREPARE RBC (CROSSMATCH)

## 2012-10-29 MED ORDER — SODIUM CHLORIDE 0.9 % IV SOLN: INTRAVENOUS | Status: DC

## 2012-10-29 MED ORDER — ALBUTEROL SULFATE (5 MG/ML) 0.5% IN NEBU: 2.5000 mg | INHALATION_SOLUTION | RESPIRATORY_TRACT | Status: DC | PRN

## 2012-10-29 MED ORDER — CEFAZOLIN SODIUM-DEXTROSE 2-3 GM-% IV SOLR
2.0000 g | Freq: Three times a day (TID) | INTRAVENOUS | Status: DC
  Administered 2012-10-29 – 2012-11-04 (×19): 2 g via INTRAVENOUS
  Filled 2012-10-29 (×21): qty 50

## 2012-10-29 MED ORDER — LORAZEPAM 0.5 MG PO TABS
0.5000 mg | ORAL_TABLET | Freq: Once | ORAL | Status: AC
  Administered 2012-10-29: 01:00:00 0.5 mg via ORAL
  Filled 2012-10-29: qty 1

## 2012-10-29 MED ORDER — LORAZEPAM 1 MG PO TABS
1.0000 mg | ORAL_TABLET | Freq: Two times a day (BID) | ORAL | Status: DC | PRN
  Administered 2012-10-29 – 2012-11-04 (×3): 1 mg via ORAL
  Filled 2012-10-29 (×3): qty 1

## 2012-10-29 NOTE — Progress Notes (Addendum)
Patient ID: [name], male   DOB: [birth year], 79 y.o.   MRN: [id number] [facility] PROGRESS NOTE  KARVER FADDEN WUJ:[id number] DOB: [birth year] DOA: 10/27/2012 PCP: Sandrea Hughs, MD  Brief narrative: 79 y.o. male with history of lymphoma in remission and pancytopenia, diabetes mellitus type 2 presented to the ED with main concern of progressively worsening generalized weakness that initially started several weeks prior to this admission but much worse over the 1-2 days prior to this admission as he was unable to ambulate and bear weight. This was associated with generalized abdominal pain, constant and throbbing, 5/10 in severity and non radiating, frontal area headache, and per his daughter more confusion. Pt has denied any nausea or vomiting, no fevers, chill,s shortness of breath, or chest pain prior to this admission. He has also denies any specific urinary concerns.   In ED, pt was found to have low grade fever of 99.8 F, noted to be somewhat confused but was oriented to name and place. UA and CXR were both unremarkable for acute events that could have explained the symptoms. Lactic acid was elevated on admission > 5 and pt had been admitted under [facility] service for further evaluation and management.   Principal Problem:   Weakness - likely secondary to acute bacteremia as noted below - providing ABX, IVF, oxygen as needed - once more clinically stable will place order for PT Active Problems:   Bacteremia due to Staphylococcus aureus, SEPSIS - slight clinical improvement but pt still restless - preliminary blood cultures positive for S. Aureus but source is unclear at this time, imaging studies of lumbar and thoracic spine unrevealing for acute etiology  - CT abdomen and pelvis also unrevealing of the exact etiology  - pt is currently on Vancomycin day #2, maxipime day #2, acyclovir day #2 - if ID agrees will likely be able to discontinue Acyclovir and Maxipime  - repeat blood  culture, ordered 2 D ECHO and TEE   Acute encephalopathy - likely secondary to bacteremia as noted above rather than HSV encephalitis - imposed on dehydration and poor oral intake also contributing to confusion - continue ABX and supportive care as noted above    Other pancytopenia - relatively chronic in nature but WBC remain stable - Plt and Hg down from admission - will check FOBT and will transfuse 2 units of PRBC today  - CBC in AM   Abdominal pain - unclear etiology, CT abdomen and pelvis unremarkable - pt tolerating current diet well - supportive care with analgesia and antiemetics as needed   Grade II diastolic dysfunction - noted on 2 D ECHO in 2013 - close monitoring of renal and respiratory function  - gentle hydration as lactic acid still elevated - strict I's and O's, daily weights    Hyponatremia - secondary to pre renal etiology and poor oral intake, dehydration - continue IVF and repeat BMP inAM   LYMPHOMA NEC, MLIG, INGUINAL/LOWER LIMB - in remission   Consultants:  ID  Procedures/Studies: Dg Chest 2 View  10/28/2012    Cardiomegaly, vascular congestion.    Dg Thoracic Spine 2 View  10/28/2012    No fracture or spondylolisthesis.  Mild degenerative changes, stable from the prior lateral chest radiograph.    Dg Lumbar Spine 2-3 Views 10/28/2012   No fracture or acute finding.  Disk degenerative changes at L1-L2.  Mild dextroscoliosis.    Ct Head Wo Contrast  10/28/2012 Stable appearance of the the brain with no acute intra  five.  Ethmoidal sinus disease.      Ct Angio Chest Pe W/cm &/or Wo Cm  10/28/2012    No evidence of pulmonary embolus.  Cardiomegaly.    Ct Abdomen Pelvis W Contrast  10/28/2012    No acute findings in the abdomen or pelvis.  Fat density within the wall of the right colon may reflect prior inflammatory bowel disease. No active inflammation or acute findings currently.   Dg Fluoro Guide Ndl Plc/bx  10/28/2012   Successful diagnostic lumbar  puncture under fluoroscopic guidance at the level of L3-4. No evidence of immediate complication.     Antibiotics:  Vancomycin 10/02 -->  Maxipime 10/02 -->  Acyclovir 10/02 -->  Code Status: Full Family Communication: Pt and daughter at bedside Disposition Plan: Home when medically stable  HPI/Subjective: No events overnight.   Objective: Filed Vitals:   10/28/12 1645 10/28/12 2017 10/29/12 0500 10/29/12 0752  BP:  116/65 124/104   Pulse:  101 92   Temp:  98.4 F (36.9 C) 99.1 F (37.3 C)   TempSrc:  Oral Oral   Resp:  24 28   Height:      Weight:      SpO2: 95% 96% 92% 93%    Intake/Output Summary (Last 24 hours) at 10/29/12 1125 Last data filed at 10/29/12 0600  Gross per 24 hour  Intake 3458.93 ml  Output   1075 ml  Net 2383.93 ml    Exam:   General:  Pt is alert, follows commands appropriately, somewhat agitated and restless appearing   Cardiovascular: Regular rate and rhythm, S1/S2, no murmurs, no rubs, no gallops  Respiratory: Clear to auscultation bilaterally, diminished breath sounds at bases   Abdomen: Soft, non tender, non distended, bowel sounds present, no guarding  Extremities: +1 bilateral lower extremity pitting edema, pulses DP and PT palpable bilaterally  Neuro: Grossly nonfocal but overall restless, answers some questions appropriately but follows all commands   Data Reviewed: Basic Metabolic Panel:  Recent Labs Lab 10/27/12 2330 10/28/12 0655 10/29/12 0510  NA 131* 127* 126*  K 3.7 3.3* 3.9  CL 93* 91* 94*  CO2 25 21 21   GLUCOSE 166* 140* 154*  BUN 15 15 18   CREATININE 0.86 0.88 0.87  CALCIUM 9.4 8.6 8.3*   Liver Function Tests:  Recent Labs Lab 10/27/12 2330 10/28/12 0655  AST 18 16  ALT 17 15  ALKPHOS 50 38*  BILITOT 1.5* 1.4*  PROT 6.7 5.9*  ALBUMIN 3.9 3.3*    Recent Labs Lab 10/28/12 0840  AMMONIA 36   CBC:  Recent Labs Lab 10/27/12 2330 10/28/12 0655 10/29/12 0510  WBC 1.8* 1.7* 1.6*   NEUTROABS 1.6*  --   --   HGB 8.9* 8.1* 7.5*  HCT 25.6* 23.4* 21.1*  MCV 105.8* 104.9* 104.5*  PLT 97* 79* 74*   Cardiac Enzymes:  Recent Labs Lab 10/28/12 0210  CKTOTAL 92  TROPONINI <0.30   CBG:  Recent Labs Lab 10/28/12 0817 10/28/12 1126 10/28/12 1658 10/28/12 2023 10/29/12 0759  GLUCAP 157* 185* 154* 191* 160*    Recent Results (from the past 240 hour(s))  CULTURE, BLOOD (ROUTINE X 2)     Status: None   Collection Time    10/28/12  2:10 AM      Result Value Range Status   Specimen Description BLOOD RIGHT ANTECUBITAL   Final   Special Requests BOTTLES DRAWN AEROBIC AND ANAEROBIC 5CC   Final   Culture  Setup Time  Final   Value: 10/28/2012 08:57     Performed at [facility]   Culture     Final   Value: STAPHYLOCOCCUS AUREUS     Note: RIFAMPIN AND GENTAMICIN SHOULD NOT BE USED AS SINGLE DRUGS FOR TREATMENT OF STAPH INFECTIONS.     Note: Gram Stain Report Called to,Read Back By and Verified With: LINDSAY HUDSON ON 10/28/2012 AT 10:06P BY WILEJ     Performed at [facility]   Report Status PENDING   Incomplete  CULTURE, BLOOD (ROUTINE X 2)     Status: None   Collection Time    10/28/12  2:15 AM      Result Value Range Status   Specimen Description BLOOD RIGHT ARM   Final   Special Requests BOTTLES DRAWN AEROBIC AND ANAEROBIC 3CC   Final   Culture  Setup Time     Final   Value: 10/28/2012 08:57     Performed at [facility]   Culture     Final   Value: STAPHYLOCOCCUS AUREUS     Note: Gram Stain Report Called to,Read Back By and Verified With: LINDSAY HUDSON ON 10/28/2012 AT 10:06P BY WILEJ     Performed at [facility]   Report Status PENDING   Incomplete  GRAM STAIN     Status: None   Collection Time    10/28/12 12:40 PM      Result Value Range Status   Specimen Description CSF   Final   Special Requests Normal   Final   Gram Stain     Final   Value: CYTOSPIN PREP.     NO ORGANISMS SEEN     NO WBC SEEN     Gram  Stain Report Called to,Read Back By and Verified With: DEUTSCH,J. AT 1702 ON 10.03.14 BY LOVE,T.   Report Status 10/28/2012 FINAL   Final     Scheduled Meds: . acyclovir  760 mg Intravenous Q8H  . albuterol  2.5 mg Nebulization Q6H  . ceFEPime (MAXIPIME) IV  2 g Intravenous Q8H  . escitalopram  10 mg Oral QHS  . famotidine  20 mg Oral QHS  . finasteride  5 mg Oral q morning - 10a  . fluticasone  2 spray Each Nare Daily  . insulin aspart  0-9 Units Subcutaneous TID WC  . ipratropium  0.5 mg Nebulization Q6H  . loratadine  10 mg Oral Daily  . mometasone-formoterol  2 puff Inhalation BID  . oxybutynin  10 mg Oral q morning - 10a  . pantoprazole  40 mg Oral Daily  . sodium chloride  3 mL Intravenous Q12H  . traZODone  50 mg Oral QHS  . vancomycin  1,000 mg Intravenous BID   Continuous Infusions:    Debbora Presto, MD  [facility] Pager [phone]  If 7PM-7AM, please contact night-coverage [email or web address] Password [email or web address] 10/29/2012, 11:25 AM   LOS: 2 days

## 2012-10-29 NOTE — Progress Notes (Signed)
Patient extremely unsteady when getting him up to urinate. It takes two people to get him up and he is still wobbling. Family member asked if patient could have a foley catheter. Doctor was notified and new orders were given for catheter. Foley placed in patient with two nurses in the room.

## 2012-10-29 NOTE — Consult Note (Signed)
INFECTIOUS DISEASE CONSULT NOTE  Date of Admission:  10/27/2012  Date of Consult:  10/29/2012  Reason for Consult: Staph aureus bacteremia Referring Physician: Lenise Arena, CHAMP  Impression/Recommendation Staph aureus bacteremia  Pancytopenia  Would Stop acyclovir Stop cefepime Add ancef Await sensi of staph Await repeat BCx Consider TEE Await stool C diff  Comment Source of his bacteremia is unclear. He has no prosthetics (states his port was removed after he finnished CTX for his lymphoma). He denies prosthetic hip or knee. Would be uncommon for him to have bacteremia from his excoriations, not impossible though with his WBC.   Thank you so much for this interesting consult,   Johny Sax (pager) [phone] [email or web address]  [name] is an 79 y.o. male.  HPI: 79 yo M with hx of lymphoma (dx 2007, in remission), pancytopenia, DM2 adm on 10-2 with several weeks of worsening weakness. By day of admission he was unable to walk and had developed abd pain. In ED he was confused and had WBC 1.8 (ANC 1.6) and Temp 100.9. He was started on vanco/cefepime/acyclovir. He was evaluated by Neuro and had LP (0 WBC, 3 RBC).  He has had lumbar and thoracic MRI's (-), CT chest/abd/pelvis (-) for infection.   Past Medical History  Diagnosis Date  . Dyspnea   . 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   . Allergic rhinitis   . Hyperlipidemia   . Exogenous obesity   . Edema     venous insufficiency  . Diabetes mellitus 2012    T2DM  . Second degree Mobitz II AV block 12/11/08    with transient syncope, resolved s/p PPM  . Paroxysmal atrial fibrillation 03/16/11    diagnosed by PPM interrogation  . Pancytopenia   . Postural dizziness     Past Surgical History  Procedure Laterality Date  . Lymph node biopsy      groin  . Sternotomy  2000  . Pericardiectomy  2000  . Penile prosthesis implant      and removal  .  Pacemaker insertion  12/11/08    MDT implanted by Dr Johney Frame     No Known Allergies  Medications:  Scheduled: . acyclovir  760 mg Intravenous Q8H  . ceFEPime (MAXIPIME) IV  2 g Intravenous Q8H  . escitalopram  10 mg Oral QHS  . famotidine  20 mg Oral QHS  . finasteride  5 mg Oral q morning - 10a  . fluticasone  2 spray Each Nare Daily  . insulin aspart  0-9 Units Subcutaneous TID WC  . ipratropium  0.5 mg Nebulization Q6H  . loratadine  10 mg Oral Daily  . mometasone-formoterol  2 puff Inhalation BID  . oxybutynin  10 mg Oral q morning - 10a  . pantoprazole  40 mg Oral Daily  . sodium chloride  3 mL Intravenous Q12H  . traZODone  50 mg Oral QHS  . vancomycin  1,000 mg Intravenous BID    Total days of antibiotics: 2 (vanco/cefepime/acyclovir)          Social History:  reports that he quit smoking about 44 years ago. His smoking use included Cigarettes. He has a 30 pack-year smoking history. He has quit using smokeless tobacco. He reports that he does not drink alcohol or use illicit drugs.  Family History  Problem Relation Age of Onset  . Diabetes Mother   . Liver cancer Brother   . Cancer Sister  General ROS: 2-3 loose BM yesterday, none today. per his son his MS is significantly better. abd "aching", see HPI.  Slept poorly, now lethargic after benzo.   Blood pressure 117/69, pulse 96, temperature 98.2 F (36.8 C), temperature source Oral, resp. rate 22, height 5\' 11"  (1.803 m), weight 114.8 kg (253 lb 1.4 oz), SpO2 97.00%. General appearance: alert, cooperative and no distress Eyes: negative findings: conjunctivae and sclerae normal and pupils equal, round, reactive to light and accomodation Throat: normal findings: oropharynx pink & moist without lesions or evidence of thrush Neck: no adenopathy and supple, symmetrical, trachea midline Lungs: clear to auscultation bilaterally Heart: regular rate and rhythm and no chest wall tenderness.  Abdomen: normal findings:  bowel sounds normal, soft, non-tender and distended Extremities: edema none and varicose veins noted Skin: multiple small areas of excoriation.    Results for orders placed during the hospital encounter of 10/27/12 (from the past 48 hour(s))  CBC WITH DIFFERENTIAL     Status: Abnormal   Collection Time    10/27/12 11:30 PM      Result Value Range   WBC 1.8 (*) 4.0 - 10.5 K/uL   RBC 2.42 (*) 4.22 - 5.81 MIL/uL   Hemoglobin 8.9 (*) 13.0 - 17.0 g/dL   HCT 16.1 (*) 09.6 - 04.5 %   MCV 105.8 (*) 78.0 - 100.0 fL   MCH 36.8 (*) 26.0 - 34.0 pg   MCHC 34.8  30.0 - 36.0 g/dL   RDW 40.9 (*) 81.1 - 91.4 %   Platelets 97 (*) 150 - 400 K/uL   Comment: SPECIMEN CHECKED FOR CLOTS     REPEATED TO VERIFY     PLATELET COUNT CONFIRMED BY SMEAR   Neutrophils Relative % 88 (*) 43 - 77 %   Neutro Abs 1.6 (*) 1.7 - 7.7 K/uL   Lymphocytes Relative 11 (*) 12 - 46 %   Lymphs Abs 0.2 (*) 0.7 - 4.0 K/uL   Monocytes Relative 1 (*) 3 - 12 %   Monocytes Absolute 0.0 (*) 0.1 - 1.0 K/uL   Eosinophils Relative 0  0 - 5 %   Eosinophils Absolute 0.0  0.0 - 0.7 K/uL   Basophils Relative 0  0 - 1 %   Basophils Absolute 0.0  0.0 - 0.1 K/uL  COMPREHENSIVE METABOLIC PANEL     Status: Abnormal   Collection Time    10/27/12 11:30 PM      Result Value Range   Sodium 131 (*) 135 - 145 mEq/L   Potassium 3.7  3.5 - 5.1 mEq/L   Chloride 93 (*) 96 - 112 mEq/L   CO2 25  19 - 32 mEq/L   Glucose, Bld 166 (*) 70 - 99 mg/dL   BUN 15  6 - 23 mg/dL   Creatinine, Ser 7.82  0.50 - 1.35 mg/dL   Calcium 9.4  8.4 - 95.6 mg/dL   Total Protein 6.7  6.0 - 8.3 g/dL   Albumin 3.9  3.5 - 5.2 g/dL   AST 18  0 - 37 U/L   ALT 17  0 - 53 U/L   Alkaline Phosphatase 50  39 - 117 U/L   Total Bilirubin 1.5 (*) 0.3 - 1.2 mg/dL   GFR calc non Af Amer 81 (*) >90 mL/min   GFR calc Af Amer >90  >90 mL/min   Comment: (NOTE)     The eGFR has been calculated using the CKD EPI equation.     This calculation has not  been validated in all clinical  situations.     eGFR's persistently <90 mL/min signify possible Chronic Kidney     Disease.  CG4 I-STAT (LACTIC ACID)     Status: Abnormal   Collection Time    10/27/12 11:46 PM      Result Value Range   Lactic Acid, Venous 4.77 (*) 0.5 - 2.2 mmol/L  URINALYSIS, ROUTINE W REFLEX MICROSCOPIC     Status: Abnormal   Collection Time    10/28/12  1:00 AM      Result Value Range   Color, Urine YELLOW  YELLOW   APPearance CLOUDY (*) CLEAR   Specific Gravity, Urine 1.018  1.005 - 1.030   pH 6.0  5.0 - 8.0   Glucose, UA NEGATIVE  NEGATIVE mg/dL   Hgb urine dipstick NEGATIVE  NEGATIVE   Bilirubin Urine NEGATIVE  NEGATIVE   Ketones, ur 15 (*) NEGATIVE mg/dL   Protein, ur NEGATIVE  NEGATIVE mg/dL   Urobilinogen, UA 1.0  0.0 - 1.0 mg/dL   Nitrite NEGATIVE  NEGATIVE   Leukocytes, UA NEGATIVE  NEGATIVE   Comment: MICROSCOPIC NOT DONE ON URINES WITH NEGATIVE PROTEIN, BLOOD, LEUKOCYTES, NITRITE, OR GLUCOSE <1000 mg/dL.  CULTURE, BLOOD (ROUTINE X 2)     Status: None   Collection Time    10/28/12  2:10 AM      Result Value Range   Specimen Description BLOOD RIGHT ANTECUBITAL     Special Requests BOTTLES DRAWN AEROBIC AND ANAEROBIC 5CC     Culture  Setup Time       Value: 10/28/2012 08:57     Performed at [facility]   Culture       Value: STAPHYLOCOCCUS AUREUS     Note: RIFAMPIN AND GENTAMICIN SHOULD NOT BE USED AS SINGLE DRUGS FOR TREATMENT OF STAPH INFECTIONS.     Note: Gram Stain Report Called to,Read Back By and Verified With: LINDSAY HUDSON ON 10/28/2012 AT 10:06P BY WILEJ     Performed at [facility]   Report Status PENDING    CK     Status: None   Collection Time    10/28/12  2:10 AM      Result Value Range   Total CK 92  7 - 232 U/L  TROPONIN I     Status: None   Collection Time    10/28/12  2:10 AM      Result Value Range   Troponin I <0.30  <0.30 ng/mL   Comment:            Due to the release kinetics of cTnI,     a negative result within the first hours      of the onset of symptoms does not rule out     myocardial infarction with certainty.     If myocardial infarction is still suspected,     repeat the test at appropriate intervals.  PRO B NATRIURETIC PEPTIDE     Status: Abnormal   Collection Time    10/28/12  2:10 AM      Result Value Range   Pro B Natriuretic peptide (BNP) 1971.0 (*) 0 - 450 pg/mL  CULTURE, BLOOD (ROUTINE X 2)     Status: None   Collection Time    10/28/12  2:15 AM      Result Value Range   Specimen Description BLOOD RIGHT ARM     Special Requests BOTTLES DRAWN AEROBIC AND ANAEROBIC 3CC     Culture  Setup  Time       Value: 10/28/2012 08:57     Performed at [facility]   Culture       Value: STAPHYLOCOCCUS AUREUS     Note: Gram Stain Report Called to,Read Back By and Verified With: LINDSAY HUDSON ON 10/28/2012 AT 10:06P BY WILEJ     Performed at [facility]   Report Status PENDING    D-DIMER, QUANTITATIVE     Status: Abnormal   Collection Time    10/28/12  2:15 AM      Result Value Range   D-Dimer, Quant 0.68 (*) 0.00 - 0.48 ug/mL-FEU   Comment:            AT THE INHOUSE ESTABLISHED CUTOFF     VALUE OF 0.48 ug/mL FEU,     THIS ASSAY HAS BEEN DOCUMENTED     IN THE LITERATURE TO HAVE     A SENSITIVITY AND NEGATIVE     PREDICTIVE VALUE OF AT LEAST     98 TO 99%.  THE TEST RESULT     SHOULD BE CORRELATED WITH     AN ASSESSMENT OF THE CLINICAL     PROBABILITY OF DVT / VTE.  BLOOD GAS, ARTERIAL     Status: Abnormal   Collection Time    10/28/12  2:30 AM      Result Value Range   FIO2 0.21     Delivery systems ROOM AIR     pH, Arterial 7.451 (*) 7.350 - 7.450   pCO2 arterial 29.5 (*) 35.0 - 45.0 mmHg   pO2, Arterial 93.5  80.0 - 100.0 mmHg   Bicarbonate 20.2  20.0 - 24.0 mEq/L   TCO2 19.0  0 - 100 mmol/L   Acid-base deficit 2.7 (*) 0.0 - 2.0 mmol/L   O2 Saturation 95.4     Patient temperature 98.6     Collection site LEFT RADIAL     Drawn by [id number]     Sample type ARTERIAL DRAW      Allens test (pass/fail) PASS  PASS  BASIC METABOLIC PANEL     Status: Abnormal   Collection Time    10/28/12  6:55 AM      Result Value Range   Sodium 127 (*) 135 - 145 mEq/L   Potassium 3.3 (*) 3.5 - 5.1 mEq/L   Chloride 91 (*) 96 - 112 mEq/L   CO2 21  19 - 32 mEq/L   Glucose, Bld 140 (*) 70 - 99 mg/dL   BUN 15  6 - 23 mg/dL   Creatinine, Ser 6.21  0.50 - 1.35 mg/dL   Calcium 8.6  8.4 - 30.8 mg/dL   GFR calc non Af Amer 80 (*) >90 mL/min   GFR calc Af Amer >90  >90 mL/min   Comment: (NOTE)     The eGFR has been calculated using the CKD EPI equation.     This calculation has not been validated in all clinical situations.     eGFR's persistently <90 mL/min signify possible Chronic Kidney     Disease.  CBC     Status: Abnormal   Collection Time    10/28/12  6:55 AM      Result Value Range   WBC 1.7 (*) 4.0 - 10.5 K/uL   RBC 2.23 (*) 4.22 - 5.81 MIL/uL   Hemoglobin 8.1 (*) 13.0 - 17.0 g/dL   HCT 65.7 (*) 84.6 - 96.2 %   MCV 104.9 (*) 78.0 - 100.0 fL  MCH 36.3 (*) 26.0 - 34.0 pg   MCHC 34.6  30.0 - 36.0 g/dL   RDW 16.1 (*) 09.6 - 04.5 %   Platelets 79 (*) 150 - 400 K/uL   Comment: CONSISTENT WITH PREVIOUS RESULT  LACTIC ACID, PLASMA     Status: Abnormal   Collection Time    10/28/12  6:55 AM      Result Value Range   Lactic Acid, Venous 5.4 (*) 0.5 - 2.2 mmol/L  HEPATIC FUNCTION PANEL     Status: Abnormal   Collection Time    10/28/12  6:55 AM      Result Value Range   Total Protein 5.9 (*) 6.0 - 8.3 g/dL   Albumin 3.3 (*) 3.5 - 5.2 g/dL   AST 16  0 - 37 U/L   ALT 15  0 - 53 U/L   Alkaline Phosphatase 38 (*) 39 - 117 U/L   Total Bilirubin 1.4 (*) 0.3 - 1.2 mg/dL   Bilirubin, Direct 0.5 (*) 0.0 - 0.3 mg/dL   Indirect Bilirubin 0.9  0.3 - 0.9 mg/dL  GLUCOSE, CAPILLARY     Status: Abnormal   Collection Time    10/28/12  8:17 AM      Result Value Range   Glucose-Capillary 157 (*) 70 - 99 mg/dL   Comment 1 Notify RN     Comment 2 Documented in Chart    AMMONIA      Status: None   Collection Time    10/28/12  8:40 AM      Result Value Range   Ammonia 36  11 - 60 umol/L  GLUCOSE, CAPILLARY     Status: Abnormal   Collection Time    10/28/12 11:26 AM      Result Value Range   Glucose-Capillary 185 (*) 70 - 99 mg/dL   Comment 1 Notify RN     Comment 2 Documented in Chart    PROTEIN AND GLUCOSE, CSF     Status: Abnormal   Collection Time    10/28/12 12:40 PM      Result Value Range   Glucose, CSF 99 (*) 43 - 76 mg/dL   Total  Protein, CSF 409 (*) 15 - 45 mg/dL  CSF CELL COUNT WITH DIFFERENTIAL     Status: Abnormal   Collection Time    10/28/12 12:40 PM      Result Value Range   Tube # 1     Color, CSF COLORLESS  COLORLESS   Appearance, CSF CLEAR  CLEAR   Supernatant NOT INDICATED     RBC Count, CSF 3 (*) 0 /cu mm   WBC, CSF 0  0 - 5 /cu mm   Other Cells, CSF TOO FEW TO COUNT, SMEAR AVAILABLE FOR REVIEW    GRAM STAIN     Status: None   Collection Time    10/28/12 12:40 PM      Result Value Range   Specimen Description CSF     Special Requests Normal     Gram Stain       Value: CYTOSPIN PREP.     NO ORGANISMS SEEN     NO WBC SEEN     Gram Stain Report Called to,Read Back By and Verified With: DEUTSCH,J. AT 1702 ON 10.03.14 BY LOVE,T.   Report Status 10/28/2012 FINAL    LACTIC ACID, PLASMA     Status: Abnormal   Collection Time    10/28/12  2:48 PM      Result  Value Range   Lactic Acid, Venous 3.3 (*) 0.5 - 2.2 mmol/L  GLUCOSE, CAPILLARY     Status: Abnormal   Collection Time    10/28/12  4:58 PM      Result Value Range   Glucose-Capillary 154 (*) 70 - 99 mg/dL   Comment 1 Notify RN     Comment 2 Documented in Chart    GLUCOSE, CAPILLARY     Status: Abnormal   Collection Time    10/28/12  8:23 PM      Result Value Range   Glucose-Capillary 191 (*) 70 - 99 mg/dL  CBC     Status: Abnormal   Collection Time    10/29/12  5:10 AM      Result Value Range   WBC 1.6 (*) 4.0 - 10.5 K/uL   RBC 2.02 (*) 4.22 - 5.81 MIL/uL    Hemoglobin 7.5 (*) 13.0 - 17.0 g/dL   HCT 16.1 (*) 09.6 - 04.5 %   MCV 104.5 (*) 78.0 - 100.0 fL   MCH 37.1 (*) 26.0 - 34.0 pg   MCHC 35.5  30.0 - 36.0 g/dL   RDW 40.9 (*) 81.1 - 91.4 %   Platelets 74 (*) 150 - 400 K/uL   Comment: CONSISTENT WITH PREVIOUS RESULT  BASIC METABOLIC PANEL     Status: Abnormal   Collection Time    10/29/12  5:10 AM      Result Value Range   Sodium 126 (*) 135 - 145 mEq/L   Potassium 3.9  3.5 - 5.1 mEq/L   Chloride 94 (*) 96 - 112 mEq/L   CO2 21  19 - 32 mEq/L   Glucose, Bld 154 (*) 70 - 99 mg/dL   BUN 18  6 - 23 mg/dL   Creatinine, Ser 7.82  0.50 - 1.35 mg/dL   Calcium 8.3 (*) 8.4 - 10.5 mg/dL   GFR calc non Af Amer 81 (*) >90 mL/min   GFR calc Af Amer >90  >90 mL/min   Comment: (NOTE)     The eGFR has been calculated using the CKD EPI equation.     This calculation has not been validated in all clinical situations.     eGFR's persistently <90 mL/min signify possible Chronic Kidney     Disease.  PRO B NATRIURETIC PEPTIDE     Status: Abnormal   Collection Time    10/29/12  5:10 AM      Result Value Range   Pro B Natriuretic peptide (BNP) 3717.0 (*) 0 - 450 pg/mL  GLUCOSE, CAPILLARY     Status: Abnormal   Collection Time    10/29/12  7:59 AM      Result Value Range   Glucose-Capillary 160 (*) 70 - 99 mg/dL  TYPE AND SCREEN     Status: None   Collection Time    10/29/12 12:18 PM      Result Value Range   ABO/RH(D) A POS     Antibody Screen NEG     Sample Expiration 11/01/2012     Unit Number [id number]     Blood Component Type RED CELLS,LR     Unit division 00     Status of Unit ALLOCATED     Transfusion Status OK TO TRANSFUSE     Crossmatch Result Compatible     Unit Number [id number]     Blood Component Type RED CELLS,LR     Unit division 00     Status of Unit ALLOCATED  Transfusion Status OK TO TRANSFUSE     Crossmatch Result Compatible    PREPARE RBC (CROSSMATCH)     Status: None   Collection Time    10/29/12 12:30 PM       Result Value Range   Order Confirmation ORDER PROCESSED BY BLOOD BANK        Component Value Date/Time   SDES CSF 10/28/2012 1240   SPECREQUEST Normal 10/28/2012 1240   CULT  Value: STAPHYLOCOCCUS AUREUS Note: Gram Stain Report Called to,Read Back By and Verified With: LINDSAY HUDSON ON 10/28/2012 AT 10:06P BY WILEJ Performed at [facility] 10/28/2012 0215   REPTSTATUS 10/28/2012 FINAL 10/28/2012 1240   Dg Chest 2 View  10/28/2012   CLINICAL DATA:  Shortness of Breath.  EXAM: CHEST  2 VIEW  COMPARISON:  09/30/2012  FINDINGS: Left pacer remains in place, unchanged. Prior median sternotomy and CABG. Cardiomegaly with vascular congestion. No overt edema, effusions or acute bony abnormality.  IMPRESSION: Cardiomegaly, vascular congestion.   Electronically Signed   By: Charlett Nose M.D.   On: 10/28/2012 02:28   Dg Thoracic Spine 2 View  10/28/2012   CLINICAL DATA:  Back pain. No injury.  EXAM: THORACIC SPINE - 2 VIEW  COMPARISON:  Sagittal reconstructed image from a chest CT, 10/28/2012 and lateral chest radiograph, 07/03/2011.  FINDINGS: No fracture or spondylolisthesis.  Mild disk degenerative changes are noted along the mid and lower thoracic spine reflected by small anterior endplate osteophytes. This appearance is stable from the prior lateral chest radiograph. The bones are demineralized.  The surrounding soft tissues are unremarkable.  IMPRESSION: No fracture or spondylolisthesis.  Mild degenerative changes, stable from the prior lateral chest radiograph.   Electronically Signed   By: Amie Portland M.D.   On: 10/28/2012 08:28   Dg Lumbar Spine 2-3 Views  10/28/2012   CLINICAL DATA:  Back pain  EXAM: LUMBAR SPINE - 2-3 VIEW  COMPARISON:  None.  FINDINGS: No fracture or spondylolisthesis.  Moderate loss of disc height with endplate sclerosis and osteophytes is noted at L1-L2. Remaining lumbar disc spaces are well preserved.  There is a mild curvature of the upper lumbar spine, convex to the  right.  The soft tissues are unremarkable.  IMPRESSION: No fracture or acute finding.  Disk degenerative changes at L1-L2.  Mild dextroscoliosis.   Electronically Signed   By: Amie Portland M.D.   On: 10/28/2012 08:25   Ct Head Wo Contrast  10/28/2012   *RADIOLOGY REPORT*  Clinical Data: Confusion  CT HEAD WITHOUT CONTRAST  Technique:  Contiguous axial images were obtained from the base of the skull through the vertex without contrast.  Comparison: Prior CT from 11/10/2008  Findings: There is no acute intracranial hemorrhage or infarct.  No mass or midline shift.  Mild hypo attenuation within the periventricular white matter is similar to prior exam, and most consistent with chronic small to some ischemic. Calcification along the quadrigeminal plate is unchanged.  CSF containing spaces are stable with asymmetric enlargement of the left lateral ventricle. No extra-axial fluid collection.  Calvarium is intact.  Orbital soft tissues are within normal limits.  Scattered mucosal thickening is seen within the ethmoidal air cells bilaterally.  Mastoid air cells well pneumatized.  IMPRESSION: 1. Stable appearance of the the brain with no acute intra five.  2.  Ethmoidal sinus disease.   Original Report Authenticated By: Rise Mu, M.D.   Ct Angio Chest Pe W/cm &/or Wo Cm  10/28/2012   CLINICAL  DATA:  Shortness of breath. Elevated D-dimer.  EXAM: CT ANGIOGRAPHY CHEST  CT ABDOMEN AND PELVIS WITH CONTRAST  TECHNIQUE: Multidetector CT imaging of the chest was performed using the standard protocol during bolus administration of intravenous contrast. Multiplanar CT image reconstructions including MIPs were obtained to evaluate the vascular anatomy. Multidetector CT imaging of the abdomen and pelvis was performed using the standard protocol during bolus administration of intravenous contrast.  CONTRAST:  OMNIPAQUE IOHEXOL 350 MG/ML SOLN  COMPARISON:  PET CT 07/02/2008  FINDINGS: CTA CHEST FINDINGS  No filling  defects in the pulmonary arteries to suggest pulmonary emboli. Left-sided pacer is in place. Cardiomegaly. There are pericardial calcifications posteriorly. Scattered coronary artery calcifications. Prior CABG moderate-sized hiatal hernia. No mediastinal, hilar, or axillary adenopathy.  Areas of scarring/ fibrosis peripherally in the upper lobes. No confluent airspace opacities or effusions.  No acute bony abnormality.  CT ABDOMEN and PELVIS FINDINGS  Liver, spleen, pancreas, gallbladder, adrenals are unremarkable. Small bilateral renal cysts. No hydronephrosis.  Stomach and small bowel are decompressed. Right colonic wall appears mildly thickened with low-density appearance. This can be seen with old inflammatory bowel disease. No active inflammation currently. No free fluid, free air or adenopathy. Aorta and iliac vessels are normal caliber.  Small bilateral inguinal hernias containing fat. Urinary bladder and prostate grossly unremarkable. No acute bony abnormality.  Review of the MIP images confirms the above findings.  IMPRESSION: CTA CHEST IMPRESSION  No evidence of pulmonary embolus.  Cardiomegaly.  CT ABDOMEN and PELVIS IMPRESSION  No acute findings in the abdomen or pelvis.  Fat density within the wall of the right colon may reflect prior inflammatory bowel disease. No active inflammation or acute findings currently.   Electronically Signed   By: Charlett Nose M.D.   On: 10/28/2012 05:05   Ct Abdomen Pelvis W Contrast  10/28/2012   CLINICAL DATA:  Shortness of breath. Elevated D-dimer.  EXAM: CT ANGIOGRAPHY CHEST  CT ABDOMEN AND PELVIS WITH CONTRAST  TECHNIQUE: Multidetector CT imaging of the chest was performed using the standard protocol during bolus administration of intravenous contrast. Multiplanar CT image reconstructions including MIPs were obtained to evaluate the vascular anatomy. Multidetector CT imaging of the abdomen and pelvis was performed using the standard protocol during bolus  administration of intravenous contrast.  CONTRAST:  OMNIPAQUE IOHEXOL 350 MG/ML SOLN  COMPARISON:  PET CT 07/02/2008  FINDINGS: CTA CHEST FINDINGS  No filling defects in the pulmonary arteries to suggest pulmonary emboli. Left-sided pacer is in place. Cardiomegaly. There are pericardial calcifications posteriorly. Scattered coronary artery calcifications. Prior CABG moderate-sized hiatal hernia. No mediastinal, hilar, or axillary adenopathy.  Areas of scarring/ fibrosis peripherally in the upper lobes. No confluent airspace opacities or effusions.  No acute bony abnormality.  CT ABDOMEN and PELVIS FINDINGS  Liver, spleen, pancreas, gallbladder, adrenals are unremarkable. Small bilateral renal cysts. No hydronephrosis.  Stomach and small bowel are decompressed. Right colonic wall appears mildly thickened with low-density appearance. This can be seen with old inflammatory bowel disease. No active inflammation currently. No free fluid, free air or adenopathy. Aorta and iliac vessels are normal caliber.  Small bilateral inguinal hernias containing fat. Urinary bladder and prostate grossly unremarkable. No acute bony abnormality.  Review of the MIP images confirms the above findings.  IMPRESSION: CTA CHEST IMPRESSION  No evidence of pulmonary embolus.  Cardiomegaly.  CT ABDOMEN and PELVIS IMPRESSION  No acute findings in the abdomen or pelvis.  Fat density within the wall of the right  colon may reflect prior inflammatory bowel disease. No active inflammation or acute findings currently.   Electronically Signed   By: Charlett Nose M.D.   On: 10/28/2012 05:05   Dg Fluoro Guide Ndl Plc/bx  10/28/2012   CLINICAL DATA:  Altered mental status.  EXAM: DIAGNOSTIC LUMBAR PUNCTURE UNDER FLUOROSCOPIC GUIDANCE  FLUOROSCOPY TIME:  0 min 42 seconds  PROCEDURE: Informed consent was obtained from the patient prior to the procedure, including potential complications of headache, allergy, and pain. With the patient prone, the  lower back was prepped with Betadine. 1% Lidocaine was used for local anesthesia. Lumbar puncture was performed at the L3-4 level using a 20 gauge needle with return of clearCSF with an opening pressure of 29 cm water. 15ml of CSF were obtained for laboratory studies. The patient tolerated the procedure well and there were no apparent complications.  IMPRESSION: Successful diagnostic lumbar puncture under fluoroscopic guidance at the level of L3-4. No evidence of immediate complication.   Electronically Signed   By: Myles Rosenthal M.D.   On: 10/28/2012 16:35   Recent Results (from the past 240 hour(s))  CULTURE, BLOOD (ROUTINE X 2)     Status: None   Collection Time    10/28/12  2:10 AM      Result Value Range Status   Specimen Description BLOOD RIGHT ANTECUBITAL   Final   Special Requests BOTTLES DRAWN AEROBIC AND ANAEROBIC 5CC   Final   Culture  Setup Time     Final   Value: 10/28/2012 08:57     Performed at [facility]   Culture     Final   Value: STAPHYLOCOCCUS AUREUS     Note: RIFAMPIN AND GENTAMICIN SHOULD NOT BE USED AS SINGLE DRUGS FOR TREATMENT OF STAPH INFECTIONS.     Note: Gram Stain Report Called to,Read Back By and Verified With: LINDSAY HUDSON ON 10/28/2012 AT 10:06P BY WILEJ     Performed at [facility]   Report Status PENDING   Incomplete  CULTURE, BLOOD (ROUTINE X 2)     Status: None   Collection Time    10/28/12  2:15 AM      Result Value Range Status   Specimen Description BLOOD RIGHT ARM   Final   Special Requests BOTTLES DRAWN AEROBIC AND ANAEROBIC 3CC   Final   Culture  Setup Time     Final   Value: 10/28/2012 08:57     Performed at [facility]   Culture     Final   Value: STAPHYLOCOCCUS AUREUS     Note: Gram Stain Report Called to,Read Back By and Verified With: LINDSAY HUDSON ON 10/28/2012 AT 10:06P BY WILEJ     Performed at [facility]   Report Status PENDING   Incomplete  GRAM STAIN     Status: None   Collection Time     10/28/12 12:40 PM      Result Value Range Status   Specimen Description CSF   Final   Special Requests Normal   Final   Gram Stain     Final   Value: CYTOSPIN PREP.     NO ORGANISMS SEEN     NO WBC SEEN     Gram Stain Report Called to,Read Back By and Verified With: DEUTSCH,J. AT 1702 ON 10.03.14 BY LOVE,T.   Report Status 10/28/2012 FINAL   Final      10/29/2012, 3:36 PM     LOS: 2 days  [place] [facility]icrobial Management Team Staphylococcus aureus bacteremia   Staphylococcus aureus bacteremia (SAB) is associated with a high rate of complications and mortality.  Specific aspects of clinical management are critical to optimizing the outcome of patients with SAB.  Therefore, the [facility] Antimicrobial Management Team [facility]) has initiated an intervention aimed at improving the management of SAB at [facility].  To do so, Infectious Diseases physicians are providing an evidence-based consult for the management of all patients with SAB.     Yes No Comments  Perform follow-up blood cultures (even if the patient is afebrile) to ensure clearance of bacteremia [x]  []    Remove vascular catheter and obtain follow-up blood cultures after the removal of the catheter []  []    Perform echocardiography to evaluate for endocarditis (transthoracic ECHO is 40-50% sensitive, TEE is > 90% sensitive) []  []  Please keep in mind, that neither test can definitively EXCLUDE endocarditis, and that should clinical suspicion remain high for endocarditis the patient should then still be treated with an "endocarditis" duration of therapy = 6 weeks  Consult electrophysiologist to evaluate implanted cardiac device (pacemaker, ICD) []  []    Ensure source control [x]  []  Have all abscesses been drained effectively? Have deep seeded infections (septic joints or osteomyelitis) had appropriate surgical debridement?  Investigate for "metastatic" sites of infection [x]  []  Does the patient have ANY symptom or  physical exam finding that would suggest a deeper infection (back or neck pain that may be suggestive of vertebral osteomyelitis or epidural abscess, muscle pain that could be a symptom of pyomyositis)?  Keep in mind that for deep seeded infections MRI imaging with contrast is preferred rather than other often insensitive tests such as plain x-rays, especially early in a patient's presentation.  Change antibiotic therapy to __________________ []  []  Beta-lactam antibiotics are preferred for MSSA due to higher cure rates.   If on Vancomycin, goal trough should be 15 - 20 mcg/mL  Estimated duration of IV antibiotic therapy:   []  []  Consult case management for probably prolonged outpatient IV antibiotic therapy

## 2012-10-30 DIAGNOSIS — I517 Cardiomegaly: Secondary | ICD-10-CM

## 2012-10-30 LAB — CULTURE, BLOOD (ROUTINE X 2)

## 2012-10-30 LAB — CBC WITH DIFFERENTIAL/PLATELET
Basophils Relative: 0 % (ref 0–1)
Eosinophils Absolute: 0 10*3/uL (ref 0.0–0.7)
Eosinophils Relative: 1 % (ref 0–5)
Lymphocytes Relative: 21 % (ref 12–46)
Lymphs Abs: 0.4 10*3/uL — ABNORMAL LOW (ref 0.7–4.0)
MCH: 34.9 pg — ABNORMAL HIGH (ref 26.0–34.0)
MCHC: 34.9 g/dL (ref 30.0–36.0)
MCV: 100 fL (ref 78.0–100.0)
Monocytes Absolute: 0 10*3/uL — ABNORMAL LOW (ref 0.1–1.0)
Platelets: 64 10*3/uL — ABNORMAL LOW (ref 150–400)
RBC: 2.58 MIL/uL — ABNORMAL LOW (ref 4.22–5.81)
RDW: 20.4 % — ABNORMAL HIGH (ref 11.5–15.5)

## 2012-10-30 LAB — TYPE AND SCREEN
ABO/RH(D): A POS
Antibody Screen: NEGATIVE
Unit division: 0
Unit division: 0

## 2012-10-30 LAB — COMPREHENSIVE METABOLIC PANEL
ALT: 21 U/L (ref 0–53)
BUN: 14 mg/dL (ref 6–23)
Calcium: 8.5 mg/dL (ref 8.4–10.5)
Creatinine, Ser: 0.74 mg/dL (ref 0.50–1.35)
GFR calc Af Amer: >90 mL/min (ref >90)
GFR calc non Af Amer: 86 mL/min — ABNORMAL LOW (ref >90)
Glucose, Bld: 133 mg/dL — ABNORMAL HIGH (ref 70–99)
Potassium: 3.6 mEq/L (ref 3.5–5.1)
Sodium: 126 mEq/L — ABNORMAL LOW (ref 135–145)
Total Protein: 6.1 g/dL (ref 6.0–8.3)

## 2012-10-30 LAB — GLUCOSE, CAPILLARY
Glucose-Capillary: 123 mg/dL — ABNORMAL HIGH (ref 70–99)
Glucose-Capillary: 122 mg/dL — ABNORMAL HIGH (ref 70–99)

## 2012-10-30 LAB — LACTIC ACID, PLASMA: Lactic Acid, Venous: 1.7 mmol/L (ref 0.5–2.2)

## 2012-10-30 LAB — VANCOMYCIN, TROUGH: Vancomycin Tr: 10 ug/mL (ref 10.0–20.0)

## 2012-10-30 MED ORDER — VANCOMYCIN HCL 10 G IV SOLR
1250.0000 mg | Freq: Two times a day (BID) | INTRAVENOUS | Status: DC
  Administered 2012-10-30: 09:00:00 1250 mg via INTRAVENOUS
  Filled 2012-10-30 (×2): qty 1250

## 2012-10-30 MED ORDER — FUROSEMIDE 10 MG/ML IJ SOLN
40.0000 mg | Freq: Once | INTRAMUSCULAR | Status: AC
  Administered 2012-10-30: 40 mg via INTRAVENOUS
  Filled 2012-10-30: qty 4

## 2012-10-30 NOTE — Progress Notes (Signed)
  Echocardiogram 2D Echocardiogram has been performed.  [name] 10/30/2012, 10:04 AM

## 2012-10-30 NOTE — Progress Notes (Signed)
Patient ID: [name], male   DOB: [birth year], 79 y.o.   MRN: [id number] [facility] PROGRESS NOTE  DWAINE PRINGLE JXB:[id number] DOB: 03-02-1932 DOA: 10/27/2012 PCP: Sandrea Hughs, MD  Brief narrative:  79 y.o. male with history of lymphoma in remission and pancytopenia, diabetes mellitus type 2 presented to the ED with main concern of progressively worsening generalized weakness that initially started several weeks prior to this admission but much worse over the 1-2 days prior to this admission as he was unable to ambulate and bear weight. This was associated with generalized abdominal pain, constant and throbbing, 5/10 in severity and non radiating, frontal area headache, and per his daughter more confusion. Pt has denied any nausea or vomiting, no fevers, chill,s shortness of breath, or chest pain prior to this admission. He has also denies any specific urinary concerns.   In ED, pt was found to have low grade fever of 99.8 F, noted to be somewhat confused but was oriented to name and place. UA and CXR were both unremarkable for acute events that could have explained the symptoms. Lactic acid was elevated on admission > 5 and pt had been admitted under [facility] service for further evaluation and management.   Principal Problem:  Weakness  - likely secondary to acute bacteremia as noted below  - providing ABX Ancef started today 10/05, IVF, oxygen as needed  - once more clinically stable will place order for PT  Active Problems:  Bacteremia due to Staphylococcus aureus, SEPSIS  - slight clinical improvement  - preliminary blood cultures positive for S. Aureus but source is unclear at this time, imaging studies of lumbar and thoracic spine unrevealing for acute etiology  - CT abdomen and pelvis also unrevealing of the exact etiology  - Vancomycin, Maxipime, Acyclovir discontinued and Ancef started  - repeat blood culture, ordered 2 D ECHO and TEE, 2D ECHO pending  Acute encephalopathy   - likely secondary to bacteremia as noted above rather than HSV encephalitis  - imposed on dehydration and poor oral intake also contributing to confusion  - continue ABX and supportive care as noted above  Other pancytopenia  - relatively chronic in nature but WBC remain stable  - Hg up from yesterday, 7.5 --> 9 (after 2 U PRBC transfusion 10/04) - CBC in AM  Abdominal pain  - unclear etiology, CT abdomen and pelvis unremarkable  - pt tolerating current diet well  - supportive care with analgesia and antiemetics as needed  Grade II diastolic dysfunction  - noted on 2 D ECHO in 2013  - close monitoring of renal and respiratory function  - stop IVF, weight today 264 lbs, give one dose Lasix 40 mg IV and monitor resonse  - strict I's and O's, daily weights  Hyponatremia  - secondary to pre renal etiology and poor oral intake, dehydration  - unchanged since yesterday - stop IVF as pt more volume overloaded on exam  LYMPHOMA NEC, MLIG, INGUINAL/LOWER LIMB  - in remission   Consultants:  ID Procedures/Studies:  Dg Chest 2 View 10/28/2012  Cardiomegaly, vascular congestion.  Dg Thoracic Spine 2 View 10/28/2012  No fracture or spondylolisthesis. Mild degenerative changes, stable from the prior lateral chest radiograph.  Dg Lumbar Spine 2-3 Views 10/28/2012  No fracture or acute finding. Disk degenerative changes at L1-L2. Mild dextroscoliosis.  Ct Head Wo Contrast 10/28/2012  Stable appearance of the the brain with no acute intra five. Ethmoidal sinus disease.  Ct Angio Chest Pe W/cm &/or  Wo Cm 10/28/2012  No evidence of pulmonary embolus. Cardiomegaly.  Ct Abdomen Pelvis W Contrast 10/28/2012  No acute findings in the abdomen or pelvis. Fat density within the wall of the right colon may reflect prior inflammatory bowel disease. No active inflammation or acute findings currently.  Dg Fluoro Guide Ndl Plc/bx 10/28/2012  Successful diagnostic lumbar puncture under fluoroscopic guidance at  the level of L3-4. No evidence of immediate complication.  Antibiotics:  Vancomycin 10/02 --> 10/05 Maxipime 10/02 --> 10/05 Acyclovir 10/02 -->  10/05 Ancef 10/05 -->   Code Status: Full  Family Communication: Pt and daughter at bedside  Disposition Plan: Home when medically stable   HPI/Subjective: No events overnight.   Objective: Filed Vitals:   10/30/12 0015 10/30/12 0115 10/30/12 0617 10/30/12 0652  BP: 144/82 116/74 115/72   Pulse: 99 100 99   Temp: 98.7 F (37.1 C) 97.6 F (36.4 C) 99.7 F (37.6 C)   TempSrc: Oral Axillary Tympanic   Resp: 22 22 20    Height:      Weight:    119.8 kg (264 lb 1.8 oz)  SpO2: 96% 94% 97%     Intake/Output Summary (Last 24 hours) at 10/30/12 0728 Last data filed at 10/30/12 0620  Gross per 24 hour  Intake  463.5 ml  Output   2400 ml  Net -1936.5 ml    Exam:   General:  Pt is alert, follows commands appropriately, not in acute distress  Cardiovascular: Regular rate and rhythm, S1/S2, no murmurs, no rubs, no gallops  Respiratory: Diminished air movement bilaterally, crackles at bases   Abdomen: Soft, non tender, non distended, bowel sounds present, no guarding  Extremities: +1 bilateral pitting LE edema, pulses DP and PT palpable bilaterally  Neuro: Grossly nonfocal  Data Reviewed: Basic Metabolic Panel:  Recent Labs Lab 10/27/12 2330 10/28/12 0655 10/29/12 0510  NA 131* 127* 126*  K 3.7 3.3* 3.9  CL 93* 91* 94*  CO2 25 21 21   GLUCOSE 166* 140* 154*  BUN 15 15 18   CREATININE 0.86 0.88 0.87  CALCIUM 9.4 8.6 8.3*   Liver Function Tests:  Recent Labs Lab 10/27/12 2330 10/28/12 0655  AST 18 16  ALT 17 15  ALKPHOS 50 38*  BILITOT 1.5* 1.4*  PROT 6.7 5.9*  ALBUMIN 3.9 3.3*    Recent Labs Lab 10/28/12 0840  AMMONIA 36   CBC:  Recent Labs Lab 10/27/12 2330 10/28/12 0655 10/29/12 0510  WBC 1.8* 1.7* 1.6*  NEUTROABS 1.6*  --   --   HGB 8.9* 8.1* 7.5*  HCT 25.6* 23.4* 21.1*  MCV 105.8* 104.9*  104.5*  PLT 97* 79* 74*   Cardiac Enzymes:  Recent Labs Lab 10/28/12 0210  CKTOTAL 92  TROPONINI <0.30   CBG:  Recent Labs Lab 10/28/12 2023 10/29/12 0759 10/29/12 1157 10/29/12 1719 10/29/12 2121  GLUCAP 191* 160* 158* 143* 145*    Recent Results (from the past 240 hour(s))  CULTURE, BLOOD (ROUTINE X 2)     Status: None   Collection Time    10/28/12  2:10 AM      Result Value Range Status   Specimen Description BLOOD RIGHT ANTECUBITAL   Final   Special Requests BOTTLES DRAWN AEROBIC AND ANAEROBIC 5CC   Final   Culture  Setup Time     Final   Value: 10/28/2012 08:57     Performed at [facility]   Culture     Final   Value: STAPHYLOCOCCUS AUREUS  Note: RIFAMPIN AND GENTAMICIN SHOULD NOT BE USED AS SINGLE DRUGS FOR TREATMENT OF STAPH INFECTIONS.     Note: Gram Stain Report Called to,Read Back By and Verified With: LINDSAY HUDSON ON 10/28/2012 AT 10:06P BY WILEJ     Performed at [facility]   Report Status PENDING   Incomplete  CULTURE, BLOOD (ROUTINE X 2)     Status: None   Collection Time    10/28/12  2:15 AM      Result Value Range Status   Specimen Description BLOOD RIGHT ARM   Final   Special Requests BOTTLES DRAWN AEROBIC AND ANAEROBIC 3CC   Final   Culture  Setup Time     Final   Value: 10/28/2012 08:57     Performed at [facility]   Culture     Final   Value: STAPHYLOCOCCUS AUREUS     Note: Gram Stain Report Called to,Read Back By and Verified With: LINDSAY HUDSON ON 10/28/2012 AT 10:06P BY WILEJ     Performed at [facility]   Report Status PENDING   Incomplete  GRAM STAIN     Status: None   Collection Time    10/28/12 12:40 PM      Result Value Range Status   Specimen Description CSF   Final   Special Requests Normal   Final   Gram Stain     Final   Value: CYTOSPIN PREP.     NO ORGANISMS SEEN     NO WBC SEEN     Gram Stain Report Called to,Read Back By and Verified With: DEUTSCH,J. AT 1702 ON 10.03.14 BY LOVE,T.    Report Status 10/28/2012 FINAL   Final  FUNGUS CULTURE W SMEAR     Status: None   Collection Time    10/28/12 12:40 PM      Result Value Range Status   Specimen Description CSF   Final   Special Requests NONE   Final   Fungal Smear     Final   Value: NO YEAST OR FUNGAL ELEMENTS SEEN     Performed at [facility]   Culture     Final   Value: CULTURE IN PROGRESS FOR FOUR WEEKS     Performed at [facility]   Report Status PENDING   Incomplete     Scheduled Meds: .  ceFAZolin (ANCEF) IV  2 g Intravenous Q8H  . escitalopram  10 mg Oral QHS  . famotidine  20 mg Oral QHS  . finasteride  5 mg Oral q morning - 10a  . fluticasone  2 spray Each Nare Daily  . insulin aspart  0-9 Units Subcutaneous TID WC  . ipratropium  0.5 mg Nebulization Q6H  . loratadine  10 mg Oral Daily  . mometasone-formoterol  2 puff Inhalation BID  . oxybutynin  10 mg Oral q morning - 10a  . pantoprazole  40 mg Oral Daily  . sodium chloride  3 mL Intravenous Q12H  . traZODone  50 mg Oral QHS  . vancomycin  1,000 mg Intravenous BID   Continuous Infusions: . sodium chloride 30 mL/hr (10/29/12 1230)     Debbora Presto, MD  [facility] Pager [phone]  If 7PM-7AM, please contact night-coverage [email or web address] Password [email or web address] 10/30/2012, 7:28 AM   LOS: 3 days

## 2012-10-30 NOTE — Progress Notes (Signed)
ANTIBIOTIC CONSULT NOTE - FOLLOW UP  Pharmacy Consult for Vancomycin Indication: Staph bacteremia  No Known Allergies  Patient Measurements: Height: 5\' 11"  (180.3 cm) Weight: 264 lb 1.8 oz (119.8 kg) IBW/kg (Calculated) : 75.3 Adjusted Body Weight:   Vital Signs: Temp: 99.7 F (37.6 C) (10/05 0617) Temp src: Tympanic (10/05 0617) BP: 115/72 mmHg (10/05 0617) Pulse Rate: 99 (10/05 0617) Intake/Output from previous day: 10/04 0701 - 10/05 0700 In: 463.5 [P.O.:360; I.V.:78; Blood:25.5] Out: 2400 [Urine:2400] Intake/Output from this shift:    Labs:  Recent Labs  10/28/12 0655 10/29/12 0510 10/30/12 0723  WBC 1.7* 1.6* 1.7*  HGB 8.1* 7.5* 9.0*  PLT 79* 74* 64*  CREATININE 0.88 0.87 0.74   Estimated Creatinine Clearance: 100.2 ml/min (by C-G formula based on Cr of 0.74).  Recent Labs  10/30/12 0723  VANCOTROUGH 10.0     Microbiology: Recent Results (from the past 720 hour(s))  CULTURE, BLOOD (ROUTINE X 2)     Status: None   Collection Time    10/28/12  2:10 AM      Result Value Range Status   Specimen Description BLOOD RIGHT ANTECUBITAL   Final   Special Requests BOTTLES DRAWN AEROBIC AND ANAEROBIC 5CC   Final   Culture  Setup Time     Final   Value: 10/28/2012 08:57     Performed at [facility]   Culture     Final   Value: STAPHYLOCOCCUS AUREUS     Note: RIFAMPIN AND GENTAMICIN SHOULD NOT BE USED AS SINGLE DRUGS FOR TREATMENT OF STAPH INFECTIONS.     Note: Gram Stain Report Called to,Read Back By and Verified With: LINDSAY HUDSON ON 10/28/2012 AT 10:06P BY WILEJ     Performed at [facility]   Report Status PENDING   Incomplete  CULTURE, BLOOD (ROUTINE X 2)     Status: None   Collection Time    10/28/12  2:15 AM      Result Value Range Status   Specimen Description BLOOD RIGHT ARM   Final   Special Requests BOTTLES DRAWN AEROBIC AND ANAEROBIC 3CC   Final   Culture  Setup Time     Final   Value: 10/28/2012 08:57     Performed at [facility]   Culture     Final   Value: STAPHYLOCOCCUS AUREUS     Note: Gram Stain Report Called to,Read Back By and Verified With: LINDSAY HUDSON ON 10/28/2012 AT 10:06P BY WILEJ     Performed at [facility]   Report Status PENDING   Incomplete  GRAM STAIN     Status: None   Collection Time    10/28/12 12:40 PM      Result Value Range Status   Specimen Description CSF   Final   Special Requests Normal   Final   Gram Stain     Final   Value: CYTOSPIN PREP.     NO ORGANISMS SEEN     NO WBC SEEN     Gram Stain Report Called to,Read Back By and Verified With: DEUTSCH,J. AT 1702 ON 10.03.14 BY LOVE,T.   Report Status 10/28/2012 FINAL   Final  FUNGUS CULTURE W SMEAR     Status: None   Collection Time    10/28/12 12:40 PM      Result Value Range Status   Specimen Description CSF   Final   Special Requests NONE   Final   Fungal Smear  Final   Value: NO YEAST OR FUNGAL ELEMENTS SEEN     Performed at [facility]   Culture     Final   Value: CULTURE IN PROGRESS FOR FOUR WEEKS     Performed at [facility]   Report Status PENDING   Incomplete    Anti-infectives   Start     Dose/Rate Route Frequency Ordered Stop   10/30/12 0830  vancomycin (VANCOCIN) 1,250 mg in sodium chloride 0.9 % 250 mL IVPB     1,250 mg 166.7 mL/hr over 90 Minutes Intravenous 2 times daily 10/30/12 0806     10/29/12 1800  ceFAZolin (ANCEF) IVPB 2 g/50 mL premix     2 g 100 mL/hr over 30 Minutes Intravenous Every 8 hours 10/29/12 1600     10/28/12 2000  vancomycin (VANCOCIN) IVPB 1000 mg/200 mL premix  Status:  Discontinued     1,000 mg 200 mL/hr over 60 Minutes Intravenous 2 times daily 10/28/12 1220 10/30/12 0806   10/28/12 0800  ceFEPIme (MAXIPIME) 2 g in dextrose 5 % 50 mL IVPB  Status:  Discontinued     2 g 100 mL/hr over 30 Minutes Intravenous Every 8 hours 10/28/12 0607 10/29/12 1600   10/28/12 0730  acyclovir (ZOVIRAX) 760 mg in dextrose 5 % 150 mL IVPB  Status:   Discontinued     760 mg 165.2 mL/hr over 60 Minutes Intravenous 3 times per day 10/28/12 0700 10/29/12 1600   10/28/12 0630  vancomycin (VANCOCIN) 1,250 mg in sodium chloride 0.9 % 250 mL IVPB  Status:  Discontinued     1,250 mg 166.7 mL/hr over 90 Minutes Intravenous 2 times daily 10/28/12 0607 10/28/12 1220   10/28/12 0145  ceFEPIme (MAXIPIME) 2 g in dextrose 5 % 50 mL IVPB     2 g 100 mL/hr over 30 Minutes Intravenous  Once 10/28/12 0130 10/28/12 0255      Assessment: 80 yoM with hx lymphoma (remission) and pancytopenia, DMT2, PAF, CAD s/p CABG, and pacemaker presents to ED with increasing weakness, lower abdominal, back pain, fever.  Patient started on broad spectrum antibiotics and acyclovir for sepsis and meningitis work up and found to have staph bacteremia.    D#3 Antibiotics 10/28/2012 >> cefepime >> 10/4 10/28/2012 >> acyclovir >> 10/4 10/28/2012 >> vanc >> 10/29/2012 >> ancef >>  Microbiology 10/3 blood 2/2: Staph  10/3 CSF: Normal 10/4 blood 2/2: collected 10/4: Cdiff ordered  10/4: Pt afebrile, WBCs low, with renal function stable (CrCl ~100 (CG), N = 84).  Vancomycin trough this morning subtherapeutic at 10 mcg/ml (goal 15-20).   Unclear source of bacteremia at this point.  TEE, ECHO ordered.    Goal of Therapy:  Vancomycin trough level 15-20 mcg/ml  Plan:  Increase to Vancomycin 1250mg  IV q 12 hours and recheck trough at [facility] F/u cultures, sensitivities, renal function, ECHO, TEE  Haynes Hoehn, PharmD 10/30/2012, 8:15 AM  Pager: [phone]

## 2012-10-30 NOTE — Progress Notes (Signed)
 INFECTIOUS DISEASE PROGRESS NOTE  ID: [name] is a 80 y.o. male with  Principal Problem:   Weakness Active Problems:   LYMPHOMA NEC, MLIG, INGUINAL/LOWER LIMB   Abdominal pain   Bacteremia due to Staphylococcus aureus   Acute encephalopathy   Other pancytopenia   Hyponatremia   Sepsis  Subjective: Without complaints, 2 BM yesterday, 1 soft formed today.   Abtx:  Anti-infectives   Start     Dose/Rate Route Frequency Ordered Stop   10/30/12 0830  vancomycin (VANCOCIN) 1,250 mg in sodium chloride 0.9 % 250 mL IVPB     1,250 mg 166.7 mL/hr over 90 Minutes Intravenous 2 times daily 10/30/12 0806     10/29/12 1800  ceFAZolin (ANCEF) IVPB 2 g/50 mL premix     2 g 100 mL/hr over 30 Minutes Intravenous Every 8 hours 10/29/12 1600     10/28/12 2000  vancomycin (VANCOCIN) IVPB 1000 mg/200 mL premix  Status:  Discontinued     1,000 mg 200 mL/hr over 60 Minutes Intravenous 2 times daily 10/28/12 1220 10/30/12 0806   10/28/12 0800  ceFEPIme (MAXIPIME) 2 g in dextrose 5 % 50 mL IVPB  Status:  Discontinued     2 g 100 mL/hr over 30 Minutes Intravenous Every 8 hours 10/28/12 0607 10/29/12 1600   10/28/12 0730  acyclovir (ZOVIRAX) 760 mg in dextrose 5 % 150 mL IVPB  Status:  Discontinued     760 mg 165.2 mL/hr over 60 Minutes Intravenous 3 times per day 10/28/12 0700 10/29/12 1600   10/28/12 0630  vancomycin (VANCOCIN) 1,250 mg in sodium chloride 0.9 % 250 mL IVPB  Status:  Discontinued     1,250 mg 166.7 mL/hr over 90 Minutes Intravenous 2 times daily 10/28/12 0607 10/28/12 1220   10/28/12 0145  ceFEPIme (MAXIPIME) 2 g in dextrose 5 % 50 mL IVPB     2 g 100 mL/hr over 30 Minutes Intravenous  Once 10/28/12 0130 10/28/12 0255      Medications:  Scheduled: .  ceFAZolin (ANCEF) IV  2 g Intravenous Q8H  . escitalopram  10 mg Oral QHS  . famotidine  20 mg Oral QHS  . finasteride  5 mg Oral q morning - 10a  . fluticasone  2 spray Each Nare Daily  . furosemide  40 mg  Intravenous Once  . insulin aspart  0-9 Units Subcutaneous TID WC  . ipratropium  0.5 mg Nebulization Q6H  . loratadine  10 mg Oral Daily  . mometasone-formoterol  2 puff Inhalation BID  . oxybutynin  10 mg Oral q morning - 10a  . pantoprazole  40 mg Oral Daily  . sodium chloride  3 mL Intravenous Q12H  . traZODone  50 mg Oral QHS    Objective: Vital signs in last 24 hours: Temp:  [97.3 F (36.3 C)-99.7 F (37.6 C)] 99.7 F (37.6 C) (10/05 0617) Pulse Rate:  [96-103] 99 (10/05 0617) Resp:  [20-22] 20 (10/05 0617) BP: (112-144)/(66-86) 115/72 mmHg (10/05 0617) SpO2:  [94 %-98 %] 94 % (10/05 0741) Weight:  [119.8 kg (264 lb 1.8 oz)] 119.8 kg (264 lb 1.8 oz) (10/05 0652)   General appearance: alert, cooperative and no distress Resp: clear to auscultation bilaterally Cardio: regular rate and rhythm GI: normal findings: bowel sounds normal and soft, non-tender  Lab Results  Recent Labs  10/29/12 0510 10/30/12 0723  WBC 1.6* 1.7*  HGB 7.5* 9.0*  HCT 21.1* 25.8*  NA 126* 126*  K 3.9 3.6    CL 94* 92*  CO2 21 23  BUN 18 14  CREATININE 0.87 0.74   Liver Panel  Recent Labs  10/27/12 2330 10/28/12 0655 10/30/12 0723  PROT 6.7 5.9* 6.1  ALBUMIN 3.9 3.3* 3.1*  AST 18 16 27  ALT 17 15 21  ALKPHOS 50 38* 39  BILITOT 1.5* 1.4* 0.8  BILIDIR  --  0.5*  --   IBILI  --  0.9  --    Sedimentation Rate No results found for this basename: ESRSEDRATE,  in the last 72 hours C-Reactive Protein No results found for this basename: CRP,  in the last 72 hours  Microbiology: Recent Results (from the past 240 hour(s))  CULTURE, BLOOD (ROUTINE X 2)     Status: None   Collection Time    10/28/12  2:10 AM      Result Value Range Status   Specimen Description BLOOD RIGHT ANTECUBITAL   Final   Special Requests BOTTLES DRAWN AEROBIC AND ANAEROBIC 5CC   Final   Culture  Setup Time     Final   Value: 10/28/2012 08:57     Performed at [facility]   Culture     Final    Value: STAPHYLOCOCCUS AUREUS     Note: RIFAMPIN AND GENTAMICIN SHOULD NOT BE USED AS SINGLE DRUGS FOR TREATMENT OF STAPH INFECTIONS.     Note: Gram Stain Report Called to,Read Back By and Verified With: LINDSAY HUDSON ON 10/28/2012 AT 10:06P BY WILEJ     Performed at [facility]   Report Status 10/30/2012 FINAL   Final   Organism ID, Bacteria STAPHYLOCOCCUS AUREUS   Final  CULTURE, BLOOD (ROUTINE X 2)     Status: None   Collection Time    10/28/12  2:15 AM      Result Value Range Status   Specimen Description BLOOD RIGHT ARM   Final   Special Requests BOTTLES DRAWN AEROBIC AND ANAEROBIC 3CC   Final   Culture  Setup Time     Final   Value: 10/28/2012 08:57     Performed at [facility]   Culture     Final   Value: STAPHYLOCOCCUS AUREUS     Note: SUSCEPTIBILITIES PERFORMED ON PREVIOUS CULTURE WITHIN THE LAST 5 DAYS.     Note: Gram Stain Report Called to,Read Back By and Verified With: LINDSAY HUDSON ON 10/28/2012 AT 10:06P BY WILEJ     Performed at [facility]   Report Status 10/30/2012 FINAL   Final  GRAM STAIN     Status: None   Collection Time    10/28/12 12:40 PM      Result Value Range Status   Specimen Description CSF   Final   Special Requests Normal   Final   Gram Stain     Final   Value: CYTOSPIN PREP.     NO ORGANISMS SEEN     NO WBC SEEN     Gram Stain Report Called to,Read Back By and Verified With: DEUTSCH,J. AT 1702 ON 10.03.14 BY LOVE,T.   Report Status 10/28/2012 FINAL   Final  FUNGUS CULTURE W SMEAR     Status: None   Collection Time    10/28/12 12:40 PM      Result Value Range Status   Specimen Description CSF   Final   Special Requests NONE   Final   Fungal Smear     Final   Value: NO YEAST OR FUNGAL ELEMENTS SEEN       Performed at [facility]   Culture     Final   Value: CULTURE IN PROGRESS FOR FOUR WEEKS     Performed at [facility]   Report Status PENDING   Incomplete    Studies/Results: Dg Fluoro Guide Ndl  Plc/bx  10/28/2012   CLINICAL DATA:  Altered mental status.  EXAM: DIAGNOSTIC LUMBAR PUNCTURE UNDER FLUOROSCOPIC GUIDANCE  FLUOROSCOPY TIME:  0 min 42 seconds  PROCEDURE: Informed consent was obtained from the patient prior to the procedure, including potential complications of headache, allergy, and pain. With the patient prone, the lower back was prepped with Betadine. 1% Lidocaine was used for local anesthesia. Lumbar puncture was performed at the L3-4 level using a 20 gauge needle with return of clearCSF with an opening pressure of 29 cm water. 15ml of CSF were obtained for laboratory studies. The patient tolerated the procedure well and there were no apparent complications.  IMPRESSION: Successful diagnostic lumbar puncture under fluoroscopic guidance at the level of L3-4. No evidence of immediate complication.   Electronically Signed   By: John  Stahl M.D.   On: 10/28/2012 16:35     Assessment/Plan: Staph bacteremia (MSSA R-PEN) Pancytopenia  Total days of antibiotics: 3 vanco/ancef  Will stop vanco Await TEE Repeat BCx done 10-4.  D/c isolation, this seems unlikely to be C diff         Jeffrey Hatcher Infectious Diseases (pager) [phone] [email or web address] 10/30/2012, 10:24 AM  LOS: 3 days    

## 2012-10-31 ENCOUNTER — Encounter (HOSPITAL_COMMUNITY): Payer: Self-pay | Admitting: *Deleted

## 2012-10-31 ENCOUNTER — Ambulatory Visit: Payer: Medicare Other | Admitting: Diagnostic Neuroimaging

## 2012-10-31 ENCOUNTER — Encounter (HOSPITAL_COMMUNITY)
Admission: EM | Disposition: A | Discharge status: Home Health | Payer: Self-pay | Source: Home / Self Care | Attending: Internal Medicine

## 2012-10-31 DIAGNOSIS — I059 Rheumatic mitral valve disease, unspecified: Secondary | ICD-10-CM

## 2012-10-31 HISTORY — PX: TEE WITHOUT CARDIOVERSION: SHX5443

## 2012-10-31 LAB — CBC
HCT: 25.8 % — ABNORMAL LOW (ref 39.0–52.0)
Hemoglobin: 9 g/dL — ABNORMAL LOW (ref 13.0–17.0)
MCH: 34.7 pg — ABNORMAL HIGH (ref 26.0–34.0)
MCHC: 34.9 g/dL (ref 30.0–36.0)
MCV: 99.6 fL (ref 78.0–100.0)
RBC: 2.59 MIL/uL — ABNORMAL LOW (ref 4.22–5.81)
RDW: 20 % — ABNORMAL HIGH (ref 11.5–15.5)

## 2012-10-31 LAB — GLUCOSE, CAPILLARY: Glucose-Capillary: 142 mg/dL — ABNORMAL HIGH (ref 70–99)

## 2012-10-31 LAB — ROCKY MTN SPOTTED FVR AB, IGM-BLOOD: RMSF IgM: 0.06 IV (ref 0.00–0.89)

## 2012-10-31 LAB — BASIC METABOLIC PANEL
BUN: 13 mg/dL (ref 6–23)
CO2: 27 mEq/L (ref 19–32)
Creatinine, Ser: 0.75 mg/dL (ref 0.50–1.35)
GFR calc Af Amer: >90 mL/min (ref >90)
GFR calc non Af Amer: 86 mL/min — ABNORMAL LOW (ref >90)

## 2012-10-31 SURGERY — ECHOCARDIOGRAM, TRANSESOPHAGEAL
Anesthesia: Moderate Sedation

## 2012-10-31 MED ORDER — MIDAZOLAM HCL 10 MG/2ML IJ SOLN
INTRAMUSCULAR | Status: DC | PRN
  Administered 2012-10-31 (×2): 2 mg via INTRAVENOUS

## 2012-10-31 MED ORDER — FENTANYL CITRATE 0.05 MG/ML IJ SOLN
INTRAMUSCULAR | Status: AC
  Filled 2012-10-31: qty 2

## 2012-10-31 MED ORDER — POTASSIUM CHLORIDE CRYS ER 20 MEQ PO TBCR
40.0000 meq | EXTENDED_RELEASE_TABLET | Freq: Once | ORAL | Status: AC
  Administered 2012-10-31: 18:00:00 40 meq via ORAL
  Filled 2012-10-31: qty 2

## 2012-10-31 MED ORDER — SODIUM CHLORIDE 0.9 % IV SOLN: INTRAVENOUS | Status: DC

## 2012-10-31 MED ORDER — BUTAMBEN-TETRACAINE-BENZOCAINE 2-2-14 % EX AERO
INHALATION_SPRAY | CUTANEOUS | Status: DC | PRN
  Administered 2012-10-31: 2 via TOPICAL

## 2012-10-31 MED ORDER — MIDAZOLAM HCL 5 MG/ML IJ SOLN
INTRAMUSCULAR | Status: AC
  Filled 2012-10-31: qty 2

## 2012-10-31 MED ORDER — FENTANYL CITRATE 0.05 MG/ML IJ SOLN
INTRAMUSCULAR | Status: DC | PRN
  Administered 2012-10-31 (×2): 25 ug via INTRAVENOUS

## 2012-10-31 MED ORDER — FUROSEMIDE 10 MG/ML IJ SOLN
20.0000 mg | Freq: Once | INTRAMUSCULAR | Status: AC
  Administered 2012-10-31: 20 mg via INTRAVENOUS

## 2012-10-31 NOTE — Interval H&P Note (Signed)
History and Physical Interval Note:  10/31/2012 1:21 PM  [name]  has presented today for surgery, with the diagnosis of possible SBE  The various methods of treatment have been discussed with the patient and family. After consideration of risks, benefits and other options for treatment, the patient has consented to  Procedure(s): TRANSESOPHAGEAL ECHOCARDIOGRAM (TEE) (N/A) as a surgical intervention .  The patient's history has been reviewed, patient examined, no change in status, stable for surgery.  I have reviewed the patient's chart and labs.  Questions were answered to the patient's satisfaction.     [name]

## 2012-10-31 NOTE — CV Procedure (Signed)
See full TEE report in [employer]. No vegetation identified. [name]

## 2012-10-31 NOTE — H&P (View-Only) (Signed)
INFECTIOUS DISEASE PROGRESS NOTE  ID: [name] is a 79 y.o. male with  Principal Problem:   Weakness Active Problems:   LYMPHOMA NEC, MLIG, INGUINAL/LOWER LIMB   Abdominal pain   Bacteremia due to Staphylococcus aureus   Acute encephalopathy   Other pancytopenia   Hyponatremia   Sepsis  Subjective: Without complaints, 2 BM yesterday, 1 soft formed today.   Abtx:  Anti-infectives   Start     Dose/Rate Route Frequency Ordered Stop   10/30/12 0830  vancomycin (VANCOCIN) 1,250 mg in sodium chloride 0.9 % 250 mL IVPB     1,250 mg 166.7 mL/hr over 90 Minutes Intravenous 2 times daily 10/30/12 0806     10/29/12 1800  ceFAZolin (ANCEF) IVPB 2 g/50 mL premix     2 g 100 mL/hr over 30 Minutes Intravenous Every 8 hours 10/29/12 1600     10/28/12 2000  vancomycin (VANCOCIN) IVPB 1000 mg/200 mL premix  Status:  Discontinued     1,000 mg 200 mL/hr over 60 Minutes Intravenous 2 times daily 10/28/12 1220 10/30/12 0806   10/28/12 0800  ceFEPIme (MAXIPIME) 2 g in dextrose 5 % 50 mL IVPB  Status:  Discontinued     2 g 100 mL/hr over 30 Minutes Intravenous Every 8 hours 10/28/12 0607 10/29/12 1600   10/28/12 0730  acyclovir (ZOVIRAX) 760 mg in dextrose 5 % 150 mL IVPB  Status:  Discontinued     760 mg 165.2 mL/hr over 60 Minutes Intravenous 3 times per day 10/28/12 0700 10/29/12 1600   10/28/12 0630  vancomycin (VANCOCIN) 1,250 mg in sodium chloride 0.9 % 250 mL IVPB  Status:  Discontinued     1,250 mg 166.7 mL/hr over 90 Minutes Intravenous 2 times daily 10/28/12 0607 10/28/12 1220   10/28/12 0145  ceFEPIme (MAXIPIME) 2 g in dextrose 5 % 50 mL IVPB     2 g 100 mL/hr over 30 Minutes Intravenous  Once 10/28/12 0130 10/28/12 0255      Medications:  Scheduled: .  ceFAZolin (ANCEF) IV  2 g Intravenous Q8H  . escitalopram  10 mg Oral QHS  . famotidine  20 mg Oral QHS  . finasteride  5 mg Oral q morning - 10a  . fluticasone  2 spray Each Nare Daily  . furosemide  40 mg  Intravenous Once  . insulin aspart  0-9 Units Subcutaneous TID WC  . ipratropium  0.5 mg Nebulization Q6H  . loratadine  10 mg Oral Daily  . mometasone-formoterol  2 puff Inhalation BID  . oxybutynin  10 mg Oral q morning - 10a  . pantoprazole  40 mg Oral Daily  . sodium chloride  3 mL Intravenous Q12H  . traZODone  50 mg Oral QHS    Objective: Vital signs in last 24 hours: Temp:  [97.3 F (36.3 C)-99.7 F (37.6 C)] 99.7 F (37.6 C) (10/05 0617) Pulse Rate:  [96-103] 99 (10/05 0617) Resp:  [20-22] 20 (10/05 0617) BP: (112-144)/(66-86) 115/72 mmHg (10/05 0617) SpO2:  [94 %-98 %] 94 % (10/05 0741) Weight:  [119.8 kg (264 lb 1.8 oz)] 119.8 kg (264 lb 1.8 oz) (10/05 5621)   General appearance: alert, cooperative and no distress Resp: clear to auscultation bilaterally Cardio: regular rate and rhythm GI: normal findings: bowel sounds normal and soft, non-tender  Lab Results  Recent Labs  10/29/12 0510 10/30/12 0723  WBC 1.6* 1.7*  HGB 7.5* 9.0*  HCT 21.1* 25.8*  NA 126* 126*  K 3.9 3.6  CL 94* 92*  CO2 21 23  BUN 18 14  CREATININE 0.87 0.74   Liver Panel  Recent Labs  10/27/12 2330 10/28/12 0655 10/30/12 0723  PROT 6.7 5.9* 6.1  ALBUMIN 3.9 3.3* 3.1*  AST 18 16 27   ALT 17 15 21   ALKPHOS 50 38* 39  BILITOT 1.5* 1.4* 0.8  BILIDIR  --  0.5*  --   IBILI  --  0.9  --    Sedimentation Rate No results found for this basename: ESRSEDRATE,  in the last 72 hours C-Reactive Protein No results found for this basename: CRP,  in the last 72 hours  Microbiology: Recent Results (from the past 240 hour(s))  CULTURE, BLOOD (ROUTINE X 2)     Status: None   Collection Time    10/28/12  2:10 AM      Result Value Range Status   Specimen Description BLOOD RIGHT ANTECUBITAL   Final   Special Requests BOTTLES DRAWN AEROBIC AND ANAEROBIC 5CC   Final   Culture  Setup Time     Final   Value: 10/28/2012 08:57     Performed at [facility]   Culture     Final    Value: STAPHYLOCOCCUS AUREUS     Note: RIFAMPIN AND GENTAMICIN SHOULD NOT BE USED AS SINGLE DRUGS FOR TREATMENT OF STAPH INFECTIONS.     Note: Gram Stain Report Called to,Read Back By and Verified With: LINDSAY HUDSON ON 10/28/2012 AT 10:06P BY WILEJ     Performed at [facility]   Report Status 10/30/2012 FINAL   Final   Organism ID, Bacteria STAPHYLOCOCCUS AUREUS   Final  CULTURE, BLOOD (ROUTINE X 2)     Status: None   Collection Time    10/28/12  2:15 AM      Result Value Range Status   Specimen Description BLOOD RIGHT ARM   Final   Special Requests BOTTLES DRAWN AEROBIC AND ANAEROBIC 3CC   Final   Culture  Setup Time     Final   Value: 10/28/2012 08:57     Performed at [facility]   Culture     Final   Value: STAPHYLOCOCCUS AUREUS     Note: SUSCEPTIBILITIES PERFORMED ON PREVIOUS CULTURE WITHIN THE LAST 5 DAYS.     Note: Gram Stain Report Called to,Read Back By and Verified With: LINDSAY HUDSON ON 10/28/2012 AT 10:06P BY WILEJ     Performed at [facility]   Report Status 10/30/2012 FINAL   Final  GRAM STAIN     Status: None   Collection Time    10/28/12 12:40 PM      Result Value Range Status   Specimen Description CSF   Final   Special Requests Normal   Final   Gram Stain     Final   Value: CYTOSPIN PREP.     NO ORGANISMS SEEN     NO WBC SEEN     Gram Stain Report Called to,Read Back By and Verified With: DEUTSCH,J. AT 1702 ON 10.03.14 BY LOVE,T.   Report Status 10/28/2012 FINAL   Final  FUNGUS CULTURE W SMEAR     Status: None   Collection Time    10/28/12 12:40 PM      Result Value Range Status   Specimen Description CSF   Final   Special Requests NONE   Final   Fungal Smear     Final   Value: NO YEAST OR FUNGAL ELEMENTS SEEN  Performed at [facility]     Final   Value: CULTURE IN PROGRESS FOR FOUR WEEKS     Performed at [facility]   Report Status PENDING   Incomplete    Studies/Results: Dg Fluoro Guide Ndl  Plc/bx  10/28/2012   CLINICAL DATA:  Altered mental status.  EXAM: DIAGNOSTIC LUMBAR PUNCTURE UNDER FLUOROSCOPIC GUIDANCE  FLUOROSCOPY TIME:  0 min 42 seconds  PROCEDURE: Informed consent was obtained from the patient prior to the procedure, including potential complications of headache, allergy, and pain. With the patient prone, the lower back was prepped with Betadine. 1% Lidocaine was used for local anesthesia. Lumbar puncture was performed at the L3-4 level using a 20 gauge needle with return of clearCSF with an opening pressure of 29 cm water. 15ml of CSF were obtained for laboratory studies. The patient tolerated the procedure well and there were no apparent complications.  IMPRESSION: Successful diagnostic lumbar puncture under fluoroscopic guidance at the level of L3-4. No evidence of immediate complication.   Electronically Signed   By: Myles Rosenthal M.D.   On: 10/28/2012 16:35     Assessment/Plan: Staph bacteremia (MSSA R-PEN) Pancytopenia  Total days of antibiotics: 3 vanco/ancef  Will stop vanco Await TEE Repeat BCx done 10-4.  D/c isolation, this seems unlikely to be C diff         [name] Infectious Diseases (pager) [phone] [email or web address] 10/30/2012, 10:24 AM  LOS: 3 days

## 2012-10-31 NOTE — Care Management Note (Addendum)
    Page 1 of 2   11/08/2012     11:07:32 AM   CARE MANAGEMENT NOTE 11/08/2012  Patient:  [name], [name]   Account Number:  [id number]  Date Initiated:  10/31/2012  Documentation initiated by:  [facility]  Subjective/Objective Assessment:   79 Y/O M ADMITTED W/ACUT ENCEPHALOPATHY.AV:[id number].     Action/Plan:   FROM HOME, ALONE.HAS SON SUPPORT.   Anticipated DC Date:  11/04/2012   Anticipated DC Plan:  HOME W HOME HEALTH SERVICES      DC Planning Services  CM consult      Choice offered to / List presented to:  C-1 Patient        HH arranged  HH-1 RN  HH-10 DISEASE MANAGEMENT  HH-2 PT      HH agency  [facility]   Status of service:  Completed, signed off Medicare Important Message given?   (If response is "NO", the following Medicare IM given date fields will be blank) Date Medicare IM given:   Date Additional Medicare IM given:    Discharge Disposition:  HOME W HOME HEALTH SERVICES  Per UR Regulation:  Reviewed for med. necessity/level of care/duration of stay  If discussed at Long Length of Stay Meetings, dates discussed:   11/03/2012  11/08/2012    Comments:  11-08-12 [address] RN,BSN [phone] CM did  make referral for [facility] services. Plan is for [facility] to provide HHPT services and IV ABX therapy. SOC to begin today with IV ABX therapy and PT services within 24-48 hours post d/c. No further needs from CM at this time.   11/03/12 KATHY MAHABIR RN,BSN NCM [phone] WILL TRANSFER TO [facility] TOMORROW FOR PACEMAKER REMOVAL,& INSERTION OF TEMPORARY PACEMAKER.THEN FOR PICC PLACEMENT-LONGTERM IV ABX.[facility] ALREADY FOLLOWING FOR HHRN-IV ABX.WILL NEED HHRN ORDERS/IV ABX-DOSE-FREQUENCY-DURATION/PICC LINE FLUSH CARE PER PROTOCAL/LABS IF NEEDED.PT-NO F/U.  11/02/12 KATHY MAHABIR RN,BSN NCM [phone] [facility] FOLLOWING FOR HHRN-IV ABX,PICC LINE CARE.WILL MEET W/[facility] TO START HOME  INSTRUCTION.PER CARDIO- FOR PACEMAKER REMOVAL IN NEXT 48HRS,THEN FOR  PICC-LONGTERM IV ABX PER ID.  11/01/12 KATHY MAHABIR RN,BSN NCM [phone] [facility] CHOSEN FOR HH.TC KRISTEN REP ABOUT HHRN-LONGTERM IV ABX.NOTED CARDIO/ID FOLLOWING.PACEMAKER INFECTED, TO BE REMOVED, WAIT 2 DAYS BEFORE PICC PLACEMENT.WILL NEED HHRN ORDERS FOR IV ABX/DURATION/FREQUENCY,& PICC LINE FLUSH PER PROTOCAL.SON BRAD JR C#[phone] & SISTER WILL BE CARGIVERS FOR IV ABX INSTRUCTION FOR HOME.  10/31/12 KATHY MAHABIR RN,BSN NCM [phone] [facility] AGENCY LIST PROVIDED.FOR ?PICC,?LONG TERM IV ABX.AWAIT FINAL HH ORDERS.

## 2012-10-31 NOTE — Progress Notes (Signed)
Echocardiogram Echocardiogram Transesophageal has been performed.  Dorothey Baseman 10/31/2012, 2:02 PM

## 2012-10-31 NOTE — Progress Notes (Signed)
TEE requested. Set up for 1 pm, w/ [place]. RN aware and will arrange [facility] transport for noon.

## 2012-10-31 NOTE — Progress Notes (Signed)
2 am dose of Ancef did not run, patient did not receive any of that dose.  Pharmacy notified.  10am dose of Ancef hung.  Will continue to monitor patient.

## 2012-10-31 NOTE — Progress Notes (Signed)
Patient ID: [name], male   DOB: 05-Jul-1932, 79 y.o.   MRN: [id number] [facility] PROGRESS NOTE  REIN POPOV JXB:[id number] DOB: 1932-09-13 DOA: 10/27/2012 PCP: Sandrea Hughs, MD  Brief narrative:  79 y.o. male with history of lymphoma in remission and pancytopenia, diabetes mellitus type 2 presented to the ED with main concern of progressively worsening generalized weakness that initially started several weeks prior to this admission but much worse over the 1-2 days prior to this admission as he was unable to ambulate and bear weight. This was associated with generalized abdominal pain, constant and throbbing, 5/10 in severity and non radiating, frontal area headache, and per his daughter more confusion. Pt has denied any nausea or vomiting, no fevers, chill,s shortness of breath, or chest pain prior to this admission. He has also denies any specific urinary concerns.   In ED, pt was found to have low grade fever of 99.8 F, noted to be somewhat confused but was oriented to name and place. UA and CXR were both unremarkable for acute events that could have explained the symptoms. Lactic acid was elevated on admission > 5 and pt had been admitted under [facility] service for further evaluation and management.   Principal Problem:  Weakness  - likely secondary to acute bacteremia as noted below  - providing ABX Ancef started 10/05, IVF, oxygen as needed  - once more clinically stable will place order for PT  Active Problems:  Bacteremia due to Staphylococcus aureus, SEPSIS  - slight clinical improvement  - preliminary blood cultures positive for S. Aureus but source is unclear at this time, imaging studies of lumbar and thoracic spine unrevealing for acute etiology  - CT abdomen and pelvis also unrevealing of the exact etiology  - Vancomycin, Maxipime, Acyclovir discontinued and Ancef started 10/05 - repeat blood culture ordered, TEE done 10/06 --> no vegetations  Acute encephalopathy   - likely secondary to bacteremia as noted above rather than HSV encephalitis  - imposed on dehydration and poor oral intake also contributing to confusion  - continue ABX and supportive care as noted above  Other pancytopenia  - relatively chronic in nature but WBC remain stable  - Hg up from 7.5 --> 9 (after 2 U PRBC transfusion 10/04) and now remain stable and at baseline  - CBC in AM  Abdominal pain  - unclear etiology, CT abdomen and pelvis unremarkable  - pt tolerating current diet well  - supportive care with analgesia and antiemetics as needed  Grade II diastolic dysfunction  - noted on 2 D ECHO in 2013, follow up on 2 D ECHO  - close monitoring of renal and respiratory function  - stop IVF, weight 10/05 264 lbs, give one dose Lasix 40 mg IV 10/05 --> weight 256 lbs this AM - strict I's and O's, daily weights  - give additional dose of Lasix today 10/05 Hyponatremia  - secondary to pre renal etiology and poor oral intake, dehydration  - 126 --> 129, monitor closely on Lasix  LYMPHOMA NEC, MLIG, INGUINAL/LOWER LIMB  - in remission   Consultants:  ID Procedures/Studies:  Dg Chest 2 View 10/28/2012  Cardiomegaly, vascular congestion.  Dg Thoracic Spine 2 View 10/28/2012  No fracture or spondylolisthesis. Mild degenerative changes, stable from the prior lateral chest radiograph.  Dg Lumbar Spine 2-3 Views 10/28/2012  No fracture or acute finding. Disk degenerative changes at L1-L2. Mild dextroscoliosis.  Ct Head Wo Contrast 10/28/2012  Stable appearance of the the brain  with no acute intra five. Ethmoidal sinus disease.  Ct Angio Chest Pe W/cm &/or Wo Cm 10/28/2012  No evidence of pulmonary embolus. Cardiomegaly.  Ct Abdomen Pelvis W Contrast 10/28/2012  No acute findings in the abdomen or pelvis. Fat density within the wall of the right colon may reflect prior inflammatory bowel disease. No active inflammation or acute findings currently.  Dg Fluoro Guide Ndl Plc/bx 10/28/2012   Successful diagnostic lumbar puncture under fluoroscopic guidance at the level of L3-4. No evidence of immediate complication.  Antibiotics:  Vancomycin 10/02 --> 10/05  Maxipime 10/02 --> 10/05  Acyclovir 10/02 --> 10/05  Ancef 10/05 --> Code Status: Full  Family Communication: Pt and daughter at bedside  Disposition Plan: Home when medically stable      HPI/Subjective: No events overnight.   Objective: Filed Vitals:   10/31/12 1330 10/31/12 1335 10/31/12 1340 10/31/12 1350  BP: 137/99 161/104 141/98 145/76  Pulse: 89 91 90 87  Temp:      TempSrc:      Resp: 17 16 18 20   Height:      Weight:      SpO2: 92% 92% 92% 99%    Intake/Output Summary (Last 24 hours) at 10/31/12 1403 Last data filed at 10/31/12 0930  Gross per 24 hour  Intake    530 ml  Output   3925 ml  Net  -3395 ml    Exam:   General:  Pt is alert, follows commands appropriately, not in acute distress  Cardiovascular: Regular rate and rhythm, S1/S2, no murmurs, no rubs, no gallops  Respiratory: Clear to auscultation bilaterally, no wheezing, bibasilar crackles   Abdomen: Soft, non tender, non distended, bowel sounds present, no guarding  Extremities: +1 bilateral LE pitting edema, pulses DP and PT palpable bilaterally  Neuro: Grossly nonfocal  Data Reviewed: Basic Metabolic Panel:  Recent Labs Lab 10/27/12 2330 10/28/12 0655 10/29/12 0510 10/30/12 0723 10/31/12 0425  NA 131* 127* 126* 126* 129*  K 3.7 3.3* 3.9 3.6 3.2*  CL 93* 91* 94* 92* 94*  CO2 25 21 21 23 27   GLUCOSE 166* 140* 154* 133* 129*  BUN 15 15 18 14 13   CREATININE 0.86 0.88 0.87 0.74 0.75  CALCIUM 9.4 8.6 8.3* 8.5 8.9   Liver Function Tests:  Recent Labs Lab 10/27/12 2330 10/28/12 0655 10/30/12 0723  AST 18 16 27   ALT 17 15 21   ALKPHOS 50 38* 39  BILITOT 1.5* 1.4* 0.8  PROT 6.7 5.9* 6.1  ALBUMIN 3.9 3.3* 3.1*    Recent Labs Lab 10/28/12 0840  AMMONIA 36   CBC:  Recent Labs Lab 10/27/12 2330  10/28/12 0655 10/29/12 0510 10/30/12 0723 10/31/12 0425  WBC 1.8* 1.7* 1.6* 1.7* 1.3*  NEUTROABS 1.6*  --   --  1.3*  --   HGB 8.9* 8.1* 7.5* 9.0* 9.0*  HCT 25.6* 23.4* 21.1* 25.8* 25.8*  MCV 105.8* 104.9* 104.5* 100.0 99.6  PLT 97* 79* 74* 64* 73*   Cardiac Enzymes:  Recent Labs Lab 10/28/12 0210  CKTOTAL 92  TROPONINI <0.30   CBG:  Recent Labs Lab 10/30/12 0715 10/30/12 1217 10/30/12 1718 10/30/12 2137 10/31/12 0742  GLUCAP 131* 154* 123* 122* 120*    Recent Results (from the past 240 hour(s))  CULTURE, BLOOD (ROUTINE X 2)     Status: None   Collection Time    10/28/12  2:10 AM      Result Value Range Status   Specimen Description BLOOD RIGHT ANTECUBITAL  Final   Special Requests BOTTLES DRAWN AEROBIC AND ANAEROBIC 5CC   Final   Culture  Setup Time     Final   Value: 10/28/2012 08:57     Performed at [facility]   Culture     Final   Value: STAPHYLOCOCCUS AUREUS     Note: RIFAMPIN AND GENTAMICIN SHOULD NOT BE USED AS SINGLE DRUGS FOR TREATMENT OF STAPH INFECTIONS.     Note: Gram Stain Report Called to,Read Back By and Verified With: LINDSAY HUDSON ON 10/28/2012 AT 10:06P BY WILEJ     Performed at [facility]   Report Status 10/30/2012 FINAL   Final   Organism ID, Bacteria STAPHYLOCOCCUS AUREUS   Final  CULTURE, BLOOD (ROUTINE X 2)     Status: None   Collection Time    10/28/12  2:15 AM      Result Value Range Status   Specimen Description BLOOD RIGHT ARM   Final   Special Requests BOTTLES DRAWN AEROBIC AND ANAEROBIC 3CC   Final   Culture  Setup Time     Final   Value: 10/28/2012 08:57     Performed at [facility]   Culture     Final   Value: STAPHYLOCOCCUS AUREUS     Note: SUSCEPTIBILITIES PERFORMED ON PREVIOUS CULTURE WITHIN THE LAST 5 DAYS.     Note: Gram Stain Report Called to,Read Back By and Verified With: LINDSAY HUDSON ON 10/28/2012 AT 10:06P BY WILEJ     Performed at [facility]   Report Status 10/30/2012  FINAL   Final  GRAM STAIN     Status: None   Collection Time    10/28/12 12:40 PM      Result Value Range Status   Specimen Description CSF   Final   Special Requests Normal   Final   Gram Stain     Final   Value: CYTOSPIN PREP.     NO ORGANISMS SEEN     NO WBC SEEN     Gram Stain Report Called to,Read Back By and Verified With: DEUTSCH,J. AT 1702 ON 10.03.14 BY LOVE,T.   Report Status 10/28/2012 FINAL   Final  FUNGUS CULTURE W SMEAR     Status: None   Collection Time    10/28/12 12:40 PM      Result Value Range Status   Specimen Description CSF   Final   Special Requests NONE   Final   Fungal Smear     Final   Value: NO YEAST OR FUNGAL ELEMENTS SEEN     Performed at [facility]   Culture     Final   Value: CULTURE IN PROGRESS FOR FOUR WEEKS     Performed at [facility]   Report Status PENDING   Incomplete  STOOL CULTURE     Status: None   Collection Time    10/29/12  7:37 AM      Result Value Range Status   Specimen Description STOOL   Final   Special Requests NONE   Final   Culture     Final   Value: Culture reincubated for better growth     Performed at [facility]   Report Status PENDING   Incomplete  CULTURE, BLOOD (ROUTINE X 2)     Status: None   Collection Time    10/29/12 12:00 PM      Result Value Range Status   Specimen Description BLOOD RIGHT ARM  3  ML IN [facility] BOTTLE   Final   Special Requests NONE   Final   Culture  Setup Time     Final   Value: 10/29/2012 19:06     Performed at [facility]   Culture     Final   Value:        BLOOD CULTURE RECEIVED NO GROWTH TO DATE CULTURE WILL BE HELD FOR 5 DAYS BEFORE ISSUING A FINAL NEGATIVE REPORT     Performed at [facility]   Report Status PENDING   Incomplete  CULTURE, BLOOD (ROUTINE X 2)     Status: None   Collection Time    10/29/12 12:18 PM      Result Value Range Status   Specimen Description BLOOD RIGHT HAND  4 ML IN [facility] BOTTLE   Final   Special Requests NONE    Final   Culture  Setup Time     Final   Value: 10/29/2012 21:55     Performed at [facility]   Culture     Final   Value:        BLOOD CULTURE RECEIVED NO GROWTH TO DATE CULTURE WILL BE HELD FOR 5 DAYS BEFORE ISSUING A FINAL NEGATIVE REPORT     Performed at [facility]   Report Status PENDING   Incomplete     Scheduled Meds: . [MAR HOLD]  ceFAZolin (ANCEF) IV  2 g Intravenous Q8H  . [MAR HOLD] escitalopram  10 mg Oral QHS  . [facility] HOLD] famotidine  20 mg Oral QHS  . [facility] HOLD] finasteride  5 mg Oral q morning - 10a  . [MAR HOLD] fluticasone  2 spray Each Nare Daily  . [MAR HOLD] insulin aspart  0-9 Units Subcutaneous TID WC  . [MAR HOLD] ipratropium  0.5 mg Nebulization Q6H  . [facility] HOLD] loratadine  10 mg Oral Daily  . [MAR HOLD] mometasone-formoterol  2 puff Inhalation BID  . [facility] HOLD] oxybutynin  10 mg Oral q morning - 10a  . [MAR HOLD] pantoprazole  40 mg Oral Daily  . [MAR HOLD] sodium chloride  3 mL Intravenous Q12H  . [MAR HOLD] traZODone  50 mg Oral QHS   Continuous Infusions: . sodium chloride       Debbora Presto, MD  [facility] Pager [phone]  If 7PM-7AM, please contact night-coverage [email or web address] Password [email or web address] 10/31/2012, 2:03 PM   LOS: 4 days

## 2012-10-31 NOTE — Clinical Documentation Improvement (Signed)
THIS DOCUMENT IS NOT A PERMANENT PART OF THE MEDICAL RECORD  Please update your documentation within the medical record to reflect your response to this query. If you need help knowing how to do this please call [phone].  10/31/12  Dear Dr.  Izola Price, [name]  In a better effort to capture your patient's severity of illness, reflect appropriate length of stay and utilization of resources, a review of the medical record has revealed the following indicators.    Based on your clinical judgment, please clarify and document in a progress note and/or discharge summary the clinical condition associated with the following supporting information:  In responding to this query please exercise your independent judgment.  The fact that a query is asked, does not imply that any particular answer is desired or expected.  Abnormal findings (laboratory, x-ray, pathologic, and other diagnostic results) are not coded and reported unless the physician indicates their clinical significance.   The medical record reflects the following clinical findings, please clarify the diagnostic and/or clinical significance:      Pt with abnormal Pro BNP= 5067  Clarification Needed   Please clarify the underlying diagnosis responsible for the abn Pro BNP in setting of cardiomegaly, and vascular congestion treated by lasix and neb treatments.    Possible Clinical Conditions?                                   ____________________                                       Other Condition___________________                 Cannot Clinically Determine_________   Supporting Information:  Diagnostics: Chest Xray 10/28/2012 IMPRESSION:Cardiomegaly, vascular congestion  CT angio chest: 10/28/2012 Cardiomegaly. Component      Pro B Natriuretic peptide (BNP)  Latest Ref Rng      0 - 450 pg/mL  10/28/2012     2:10 AM 1971.0 (H)   Component      Pro B Natriuretic peptide (BNP)  Latest Ref Rng      0 - 450 pg/mL   10/29/2012     5:10 AM 3717.0 (H)   Component      Pro B Natriuretic peptide (BNP)  Latest Ref Rng      0 - 450 pg/mL  10/31/2012      5067.0 (H)    Treatment:   furosemide (LASIX) injection 40 mg   furosemide (LASIX) injection 20 mg   Intake and Output  Daily weights  Cardiac monitoring  ipratropium (ATROVENT) nebulizer solution 0.5 mg    albuterol (PROVENTIL) (5 MG/ML) 0.5% nebulizer solution 2.5 mg    furosemide (LASIX) injection 20 mg    fluticasone (FLONASE) 50 MCG/ACT nasal spray 2 spray    Reviewed:  no additional documentation provided   Thank You,  Enis Slipper  RN, BSN, MSN/Inf, CCDS Clinical Documentation Specialist [facility] Dept Pager: [phone] / E-mail: [email or web address]  [phone] Health Information Management [place]

## 2012-11-01 ENCOUNTER — Encounter (HOSPITAL_COMMUNITY): Payer: Self-pay | Admitting: Cardiology

## 2012-11-01 DIAGNOSIS — G9340 Encephalopathy, unspecified: Secondary | ICD-10-CM

## 2012-11-01 DIAGNOSIS — Z113 Encounter for screening for infections with a predominantly sexual mode of transmission: Secondary | ICD-10-CM

## 2012-11-01 LAB — BASIC METABOLIC PANEL
Calcium: 8.8 mg/dL (ref 8.4–10.5)
GFR calc Af Amer: >90 mL/min (ref >90)
GFR calc non Af Amer: 88 mL/min — ABNORMAL LOW (ref >90)
Glucose, Bld: 127 mg/dL — ABNORMAL HIGH (ref 70–99)
Potassium: 3 mEq/L — ABNORMAL LOW (ref 3.5–5.1)
Sodium: 129 mEq/L — ABNORMAL LOW (ref 135–145)

## 2012-11-01 LAB — STOOL CULTURE

## 2012-11-01 LAB — CBC
MCH: 35.1 pg — ABNORMAL HIGH (ref 26.0–34.0)
MCHC: 34.9 g/dL (ref 30.0–36.0)
Platelets: 65 10*3/uL — ABNORMAL LOW (ref 150–400)
RDW: 19.5 % — ABNORMAL HIGH (ref 11.5–15.5)
WBC: 0.9 10*3/uL — CL (ref 4.0–10.5)

## 2012-11-01 LAB — GLUCOSE, CAPILLARY: Glucose-Capillary: 125 mg/dL — ABNORMAL HIGH (ref 70–99)

## 2012-11-01 LAB — URINE CULTURE
Colony Count: NO GROWTH
Culture: NO GROWTH

## 2012-11-01 MED ORDER — POTASSIUM CHLORIDE CRYS ER 20 MEQ PO TBCR
40.0000 meq | EXTENDED_RELEASE_TABLET | Freq: Two times a day (BID) | ORAL | Status: AC
  Administered 2012-11-01 (×2): 40 meq via ORAL
  Filled 2012-11-01 (×2): qty 2

## 2012-11-01 MED ORDER — POTASSIUM CHLORIDE CRYS ER 20 MEQ PO TBCR
20.0000 meq | EXTENDED_RELEASE_TABLET | Freq: Every day | ORAL | Status: DC
  Administered 2012-11-02 – 2012-11-08 (×7): 20 meq via ORAL
  Filled 2012-11-01 (×7): qty 1

## 2012-11-01 MED ORDER — FUROSEMIDE 20 MG PO TABS
20.0000 mg | ORAL_TABLET | Freq: Every day | ORAL | Status: DC
  Administered 2012-11-01 – 2012-11-08 (×8): 20 mg via ORAL
  Filled 2012-11-01 (×8): qty 1

## 2012-11-01 NOTE — Progress Notes (Addendum)
Patient ID: [name], male   DOB: 11/19/1932, 79 y.o.   MRN: [id number]  [facility] PROGRESS NOTE  [name] WUJ:[id number] DOB: September 20, 1932 DOA: 10/27/2012 PCP: Sandrea Hughs, MD  Brief narrative:  79 y.o. male with history of lymphoma in remission and pancytopenia, has pacemaker, diabetes mellitus type 2 presented to the ED with main concern of progressively worsening generalized weakness that initially started several weeks prior to this admission but much worse over the 1-2 days prior to this admission as he was unable to ambulate and bear weight. This was associated with generalized abdominal pain, constant and throbbing, 5/10 in severity and non radiating, frontal area headache, and per his daughter more confusion. Pt has denied any nausea or vomiting, no fevers, chill,s shortness of breath, or chest pain prior to this admission. He has also denies any specific urinary concerns.   In ED, pt was found to have low grade fever of 99.8 F, noted to be somewhat confused but was oriented to name and place. UA and CXR were both unremarkable for acute events that could have explained the symptoms. Lactic acid was elevated on admission > 5 and pt had been admitted under [facility] service for further evaluation and management.   Principal Problem:  Bacteremia due to Staphylococcus aureus, SEPSIS  - Continue to clinically improved - preliminary blood cultures positive for S. Aureus, this patient has pacemaker, could be potential source, discuss with Dr. Daiva Eves and will also consult cardiology for pacemaker removal - CT abdomen and pelvis also unrevealing of the exact etiology  - Vancomycin, Maxipime, Acyclovir discontinued 10/05 and Ancef started 10/05  - repeat blood culture with no growth to date, TEE done 10/06 --> no vegetations  - PICC line cannot be placed until pacemaker removed Active Problems:  Acute encephalopathy  - likely secondary to bacteremia as noted above rather than HSV  encephalitis  - imposed on dehydration and poor oral intake also contributing to confusion  - continue ABX and supportive care as noted above  Other pancytopenia  - relatively chronic in nature but WBC remain stable  - Hg up from 7.5 --> 9 (after 2 U PRBC transfusion 10/04) - Slight drop in WBC, hemoglobin, platelets over 24 hours, will monitor closely Hypokalemia - Secondary to Lasix, we'll continue to supplement with K. Dur and will repeat BMP in the morning Abdominal pain  - unclear etiology, CT abdomen and pelvis unremarkable  - pt tolerating current diet well  - supportive care with analgesia and antiemetics as needed  Low back pain - X-ray images unremarkable for acute events, MRI of the lumbar spine will be needed once the pacemaker is removed - Continue analgesia as needed Grade II diastolic dysfunction  - noted on 2 D ECHO in 2013, 2-D echo done 10/30/2012 with normal systolic function and ejection fraction of 55% - Patient was initially started on IV fluids do to sepsis, staph aureus positive bacteremia - IV fluids were stopped on 10/05 as weight gain was noted from baseline of 258 lbs --> 264 - weight 10/05 264 lbs, gave one dose Lasix 40 mg IV 10/05 --> weight 256 lbs (additional dose of Lasix 20 mg IV given 10/06) --> 252 lbs this AM - strict I's and O's, daily weights  - Since patient still slightly volume overloaded we'll keep on Lasix 20 mg by mouth daily, dose can be readjusted if clinically indicated Hyponatremia  - secondary to pre renal etiology and poor oral intake, dehydration  -  126 --> 129, monitor closely on Lasix  LYMPHOMA NEC, MLIG, INGUINAL/LOWER LIMB  - in remission   Consultants:  [facility] for TEE and pacemaker removal Procedures/Studies:  Dg Chest 2 View 10/28/2012  Cardiomegaly, vascular congestion.  Dg Thoracic Spine 2 View 10/28/2012  No fracture or spondylolisthesis. Mild degenerative changes, stable from the prior lateral chest radiograph.   Dg Lumbar Spine 2-3 Views 10/28/2012  No fracture or acute finding. Disk degenerative changes at L1-L2. Mild dextroscoliosis.  Ct Head Wo Contrast 10/28/2012  Stable appearance of the the brain with no acute intra five. Ethmoidal sinus disease.  Ct Angio Chest Pe W/cm &/or Wo Cm 10/28/2012  No evidence of pulmonary embolus. Cardiomegaly.  Ct Abdomen Pelvis W Contrast 10/28/2012  No acute findings in the abdomen or pelvis. Fat density within the wall of the right colon may reflect prior inflammatory bowel disease. No active inflammation or acute findings currently.  Dg Fluoro Guide Ndl Plc/bx 10/28/2012  Successful diagnostic lumbar puncture under fluoroscopic guidance at the level of L3-4. No evidence of immediate complication.  Antibiotics:  Vancomycin 10/02 --> 10/05  Maxipime 10/02 --> 10/05  Acyclovir 10/02 --> 10/05  Ancef 10/05 --> Code Status: Full  Family Communication: Pt and daughter at bedside  Disposition Plan: Home when medically stable  HPI/Subjective: No events overnight.   Objective: Filed Vitals:   10/31/12 1959 10/31/12 2100 11/01/12 0700 11/01/12 0844  BP:  114/55 129/71   Pulse:  91 91   Temp:  99.9 F (37.7 C) 98 F (36.7 C)   TempSrc:  Oral Oral   Resp:  20 20   Height:      Weight:   114.4 kg (252 lb 3.3 oz)   SpO2: 91% 95% 97% 95%    Intake/Output Summary (Last 24 hours) at 11/01/12 1050 Last data filed at 11/01/12 0700  Gross per 24 hour  Intake    440 ml  Output   3455 ml  Net  -3015 ml    Exam:   General:  Pt is alert, follows commands appropriately, not in acute distress  Cardiovascular: Regular rate and rhythm, S1/S2, no murmurs, no rubs, no gallops  Respiratory: Clear to auscultation bilaterally, no wheezing, bibasilar crackles mild  Abdomen: Soft, non tender, non distended, bowel sounds present, no guarding  Extremities: Lower extremity +1 pitting edema, trace bilateral upper extremity edema, pulses DP and PT palpable  bilaterally  Neuro: Grossly nonfocal  Data Reviewed: Basic Metabolic Panel:  Recent Labs Lab 10/28/12 0655 10/29/12 0510 10/30/12 0723 10/31/12 0425 11/01/12 0437  NA 127* 126* 126* 129* 129*  K 3.3* 3.9 3.6 3.2* 3.0*  CL 91* 94* 92* 94* 92*  CO2 21 21 23 27 28   GLUCOSE 140* 154* 133* 129* 127*  BUN 15 18 14 13 12   CREATININE 0.88 0.87 0.74 0.75 0.70  CALCIUM 8.6 8.3* 8.5 8.9 8.8   Liver Function Tests:  Recent Labs Lab 10/27/12 2330 10/28/12 0655 10/30/12 0723  AST 18 16 27   ALT 17 15 21   ALKPHOS 50 38* 39  BILITOT 1.5* 1.4* 0.8  PROT 6.7 5.9* 6.1  ALBUMIN 3.9 3.3* 3.1*   Recent Labs Lab 10/28/12 0840  AMMONIA 36   CBC:  Recent Labs Lab 10/27/12 2330 10/28/12 0655 10/29/12 0510 10/30/12 0723 10/31/12 0425 11/01/12 0437  WBC 1.8* 1.7* 1.6* 1.7* 1.3* 0.9*  NEUTROABS 1.6*  --   --  1.3*  --   --   HGB 8.9* 8.1* 7.5* 9.0* 9.0* 8.7*  HCT 25.6* 23.4* 21.1* 25.8* 25.8* 24.9*  MCV 105.8* 104.9* 104.5* 100.0 99.6 100.4*  PLT 97* 79* 74* 64* 73* 65*   Cardiac Enzymes:  Recent Labs Lab 10/28/12 0210  CKTOTAL 92  TROPONINI <0.30   CBG:  Recent Labs Lab 10/30/12 2137 10/31/12 0742 10/31/12 1742 10/31/12 2109 11/01/12 0744  GLUCAP 122* 120* 105* 142* 110*    Recent Results (from the past 240 hour(s))  CULTURE, BLOOD (ROUTINE X 2)     Status: None   Collection Time    10/28/12  2:10 AM      Result Value Range Status   Specimen Description BLOOD RIGHT ANTECUBITAL   Final   Special Requests BOTTLES DRAWN AEROBIC AND ANAEROBIC 5CC   Final   Culture  Setup Time     Final   Value: 10/28/2012 08:57     Performed at [facility]   Culture     Final   Value: STAPHYLOCOCCUS AUREUS     Note: RIFAMPIN AND GENTAMICIN SHOULD NOT BE USED AS SINGLE DRUGS FOR TREATMENT OF STAPH INFECTIONS.     Note: Gram Stain Report Called to,Read Back By and Verified With: LINDSAY HUDSON ON 10/28/2012 AT 10:06P BY WILEJ     Performed at [facility]    Report Status 10/30/2012 FINAL   Final   Organism ID, Bacteria STAPHYLOCOCCUS AUREUS   Final  CULTURE, BLOOD (ROUTINE X 2)     Status: None   Collection Time    10/28/12  2:15 AM      Result Value Range Status   Specimen Description BLOOD RIGHT ARM   Final   Special Requests BOTTLES DRAWN AEROBIC AND ANAEROBIC 3CC   Final   Culture  Setup Time     Final   Value: 10/28/2012 08:57     Performed at [facility]   Culture     Final   Value: STAPHYLOCOCCUS AUREUS     Note: SUSCEPTIBILITIES PERFORMED ON PREVIOUS CULTURE WITHIN THE LAST 5 DAYS.     Note: Gram Stain Report Called to,Read Back By and Verified With: LINDSAY HUDSON ON 10/28/2012 AT 10:06P BY WILEJ     Performed at [facility]   Report Status 10/30/2012 FINAL   Final  GRAM STAIN     Status: None   Collection Time    10/28/12 12:40 PM      Result Value Range Status   Specimen Description CSF   Final   Special Requests Normal   Final   Gram Stain     Final   Value: CYTOSPIN PREP.     NO ORGANISMS SEEN     NO WBC SEEN     Gram Stain Report Called to,Read Back By and Verified With: DEUTSCH,J. AT 1702 ON 10.03.14 BY LOVE,T.   Report Status 10/28/2012 FINAL   Final  FUNGUS CULTURE W SMEAR     Status: None   Collection Time    10/28/12 12:40 PM      Result Value Range Status   Specimen Description CSF   Final   Special Requests NONE   Final   Fungal Smear     Final   Value: NO YEAST OR FUNGAL ELEMENTS SEEN     Performed at [facility]   Culture     Final   Value: CULTURE IN PROGRESS FOR FOUR WEEKS     Performed at [facility]   Report Status PENDING   Incomplete  STOOL CULTURE  Status: None   Collection Time    10/29/12  7:37 AM      Result Value Range Status   Specimen Description STOOL   Final   Special Requests NONE   Final   Culture     Final   Value: NO SALMONELLA, SHIGELLA, CAMPYLOBACTER, YERSINIA, OR E.COLI 0157:H7 ISOLATED     Performed at [facility]   Report  Status 11/01/2012 FINAL   Final  CULTURE, BLOOD (ROUTINE X 2)     Status: None   Collection Time    10/29/12 12:00 PM      Result Value Range Status   Specimen Description BLOOD RIGHT ARM  3 ML IN [facility] BOTTLE   Final   Special Requests NONE   Final   Culture  Setup Time     Final   Value: 10/29/2012 19:06     Performed at [facility]   Culture     Final   Value:        BLOOD CULTURE RECEIVED NO GROWTH TO DATE CULTURE WILL BE HELD FOR 5 DAYS BEFORE ISSUING A FINAL NEGATIVE REPORT     Performed at [facility]   Report Status PENDING   Incomplete  CULTURE, BLOOD (ROUTINE X 2)     Status: None   Collection Time    10/29/12 12:18 PM      Result Value Range Status   Specimen Description BLOOD RIGHT HAND  4 ML IN [facility] BOTTLE   Final   Special Requests NONE   Final   Culture  Setup Time     Final   Value: 10/29/2012 21:55     Performed at [facility]   Culture     Final   Value:        BLOOD CULTURE RECEIVED NO GROWTH TO DATE CULTURE WILL BE HELD FOR 5 DAYS BEFORE ISSUING A FINAL NEGATIVE REPORT     Performed at [facility]   Report Status PENDING   Incomplete  URINE CULTURE     Status: None   Collection Time    10/30/12  9:39 AM      Result Value Range Status   Specimen Description URINE, CATHETERIZED   Final   Special Requests NONE   Final   Culture  Setup Time     Final   Value: 10/30/2012 21:11     Performed at [facility] Count     Final   Value: NO GROWTH     Performed at [facility]   Culture     Final   Value: NO GROWTH     Performed at [facility]   Report Status 11/01/2012 FINAL   Final     Scheduled Meds: .  ceFAZolin (ANCEF) IV  2 g Intravenous Q8H  . escitalopram  10 mg Oral QHS  . famotidine  20 mg Oral QHS  . finasteride  5 mg Oral q morning - 10a  . fluticasone  2 spray Each Nare Daily  . insulin aspart  0-9 Units Subcutaneous TID WC  . loratadine  10 mg Oral Daily  .  mometasone-formoterol  2 puff Inhalation BID  . oxybutynin  10 mg Oral q morning - 10a  . pantoprazole  40 mg Oral Daily  . sodium chloride  3 mL Intravenous Q12H  . traZODone  50 mg Oral QHS   Continuous Infusions:   Debbora Presto, MD  [facility] Pager [phone]  If 7PM-7AM, please contact night-coverage [email or web address] Password [email or web address] 11/01/2012, 10:50 AM   LOS: 5 days

## 2012-11-01 NOTE — Progress Notes (Signed)
[facility]    Subjective: No new complaints, he informs me he has Pacemaker   Antibiotics:  Anti-infectives   Start     Dose/Rate Route Frequency Ordered Stop   10/30/12 0830  vancomycin (VANCOCIN) 1,250 mg in sodium chloride 0.9 % 250 mL IVPB  Status:  Discontinued     1,250 mg 166.7 mL/hr over 90 Minutes Intravenous 2 times daily 10/30/12 0806 10/30/12 1031   10/29/12 1800  ceFAZolin (ANCEF) IVPB 2 g/50 mL premix     2 g 100 mL/hr over 30 Minutes Intravenous Every 8 hours 10/29/12 1600     10/28/12 2000  vancomycin (VANCOCIN) IVPB 1000 mg/200 mL premix  Status:  Discontinued     1,000 mg 200 mL/hr over 60 Minutes Intravenous 2 times daily 10/28/12 1220 10/30/12 0806   10/28/12 0800  ceFEPIme (MAXIPIME) 2 g in dextrose 5 % 50 mL IVPB  Status:  Discontinued     2 g 100 mL/hr over 30 Minutes Intravenous Every 8 hours 10/28/12 0607 10/29/12 1600   10/28/12 0730  acyclovir (ZOVIRAX) 760 mg in dextrose 5 % 150 mL IVPB  Status:  Discontinued     760 mg 165.2 mL/hr over 60 Minutes Intravenous 3 times per day 10/28/12 0700 10/29/12 1600   10/28/12 0630  vancomycin (VANCOCIN) 1,250 mg in sodium chloride 0.9 % 250 mL IVPB  Status:  Discontinued     1,250 mg 166.7 mL/hr over 90 Minutes Intravenous 2 times daily 10/28/12 0607 10/28/12 1220   10/28/12 0145  ceFEPIme (MAXIPIME) 2 g in dextrose 5 % 50 mL IVPB     2 g 100 mL/hr over 30 Minutes Intravenous  Once 10/28/12 0130 10/28/12 0255      Medications: Scheduled Meds: .  ceFAZolin (ANCEF) IV  2 g Intravenous Q8H  . escitalopram  10 mg Oral QHS  . famotidine  20 mg Oral QHS  . finasteride  5 mg Oral q morning - 10a  . fluticasone  2 spray Each Nare Daily  . furosemide  20 mg Oral Daily  . insulin aspart  0-9 Units Subcutaneous TID WC  . loratadine  10 mg Oral Daily  . mometasone-formoterol  2 puff Inhalation BID  . oxybutynin  10 mg Oral q morning - 10a  . pantoprazole  40 mg Oral Daily  . [START ON  11/02/2012] potassium chloride  20 mEq Oral Daily  . potassium chloride  40 mEq Oral BID  . sodium chloride  3 mL Intravenous Q12H  . traZODone  50 mg Oral QHS   Continuous Infusions:  PRN Meds:.acetaminophen, acetaminophen, acetaminophen, acetaminophen, albuterol, dextromethorphan-guaiFENesin, hydrocortisone cream, LORazepam, ondansetron (ZOFRAN) IV, ondansetron   Objective: Weight change: -4 lb 6.6 oz (-2 kg)  Intake/Output Summary (Last 24 hours) at 11/01/12 1157 Last data filed at 11/01/12 0700  Gross per 24 hour  Intake    440 ml  Output   3455 ml  Net  -3015 ml   Blood pressure 129/71, pulse 91, temperature 98 F (36.7 C), temperature source Oral, resp. rate 20, height 5\' 11"  (1.803 m), weight 252 lb 3.3 oz (114.4 kg), SpO2 95.00%. Temp:  [97.8 F (36.6 C)-99.9 F (37.7 C)] 98 F (36.7 C) (10/07 0700) Pulse Rate:  [85-96] 91 (10/07 0700) Resp:  [14-22] 20 (10/07 0700) BP: (114-161)/(55-104) 129/71 mmHg (10/07 0700) SpO2:  [91 %-100 %] 95 % (10/07 0844) Weight:  [252 lb 3.3 oz (114.4 kg)] 252 lb 3.3 oz (114.4 kg) (10/07  0700)  Physical Exam: General: Alert and awake, oriented x3, not in any acute distress. HEENT: anicteric sclera, pupils reactive to light and accommodation, EOMI CVS regular rate, normal r,  no murmurs,  Chest: clear to auscultation bilaterally, no wheezing, rales or rhonchi Abdomen: soft nontender, nondistended, normal bowel sounds, Extremities: no  clubbing or edema noted bilaterally, no deformities of spine Skin: no rashes, PM pocket is not red or fluctuant Neuro: nonfocal  Lab Results:  Recent Labs  10/31/12 0425 11/01/12 0437  WBC 1.3* 0.9*  HGB 9.0* 8.7*  HCT 25.8* 24.9*  PLT 73* 65*    BMET  Recent Labs  10/31/12 0425 11/01/12 0437  NA 129* 129*  K 3.2* 3.0*  CL 94* 92*  CO2 27 28  GLUCOSE 129* 127*  BUN 13 12  CREATININE 0.75 0.70  CALCIUM 8.9 8.8    Micro Results: Recent Results (from the past 240 hour(s))  CULTURE,  BLOOD (ROUTINE X 2)     Status: None   Collection Time    10/28/12  2:10 AM      Result Value Range Status   Specimen Description BLOOD RIGHT ANTECUBITAL   Final   Special Requests BOTTLES DRAWN AEROBIC AND ANAEROBIC 5CC   Final   Culture  Setup Time     Final   Value: 10/28/2012 08:57     Performed at [facility]   Culture     Final   Value: STAPHYLOCOCCUS AUREUS     Note: RIFAMPIN AND GENTAMICIN SHOULD NOT BE USED AS SINGLE DRUGS FOR TREATMENT OF STAPH INFECTIONS.     Note: Gram Stain Report Called to,Read Back By and Verified With: LINDSAY HUDSON ON 10/28/2012 AT 10:06P BY WILEJ     Performed at [facility]   Report Status 10/30/2012 FINAL   Final   Organism ID, Bacteria STAPHYLOCOCCUS AUREUS   Final  CULTURE, BLOOD (ROUTINE X 2)     Status: None   Collection Time    10/28/12  2:15 AM      Result Value Range Status   Specimen Description BLOOD RIGHT ARM   Final   Special Requests BOTTLES DRAWN AEROBIC AND ANAEROBIC 3CC   Final   Culture  Setup Time     Final   Value: 10/28/2012 08:57     Performed at [facility]   Culture     Final   Value: STAPHYLOCOCCUS AUREUS     Note: SUSCEPTIBILITIES PERFORMED ON PREVIOUS CULTURE WITHIN THE LAST 5 DAYS.     Note: Gram Stain Report Called to,Read Back By and Verified With: LINDSAY HUDSON ON 10/28/2012 AT 10:06P BY WILEJ     Performed at [facility]   Report Status 10/30/2012 FINAL   Final  GRAM STAIN     Status: None   Collection Time    10/28/12 12:40 PM      Result Value Range Status   Specimen Description CSF   Final   Special Requests Normal   Final   Gram Stain     Final   Value: CYTOSPIN PREP.     NO ORGANISMS SEEN     NO WBC SEEN     Gram Stain Report Called to,Read Back By and Verified With: DEUTSCH,J. AT 1702 ON 10.03.14 BY LOVE,T.   Report Status 10/28/2012 FINAL   Final  FUNGUS CULTURE W SMEAR     Status: None   Collection Time    10/28/12 12:40 PM  Result Value Range Status    Specimen Description CSF   Final   Special Requests NONE   Final   Fungal Smear     Final   Value: NO YEAST OR FUNGAL ELEMENTS SEEN     Performed at [facility]   Culture     Final   Value: CULTURE IN PROGRESS FOR FOUR WEEKS     Performed at [facility]   Report Status PENDING   Incomplete  STOOL CULTURE     Status: None   Collection Time    10/29/12  7:37 AM      Result Value Range Status   Specimen Description STOOL   Final   Special Requests NONE   Final   Culture     Final   Value: NO SALMONELLA, SHIGELLA, CAMPYLOBACTER, YERSINIA, OR E.COLI 0157:H7 ISOLATED     Performed at [facility]   Report Status 11/01/2012 FINAL   Final  CULTURE, BLOOD (ROUTINE X 2)     Status: None   Collection Time    10/29/12 12:00 PM      Result Value Range Status   Specimen Description BLOOD RIGHT ARM  3 ML IN [facility] BOTTLE   Final   Special Requests NONE   Final   Culture  Setup Time     Final   Value: 10/29/2012 19:06     Performed at [facility]   Culture     Final   Value:        BLOOD CULTURE RECEIVED NO GROWTH TO DATE CULTURE WILL BE HELD FOR 5 DAYS BEFORE ISSUING A FINAL NEGATIVE REPORT     Performed at [facility]   Report Status PENDING   Incomplete  CULTURE, BLOOD (ROUTINE X 2)     Status: None   Collection Time    10/29/12 12:18 PM      Result Value Range Status   Specimen Description BLOOD RIGHT HAND  4 ML IN [facility] BOTTLE   Final   Special Requests NONE   Final   Culture  Setup Time     Final   Value: 10/29/2012 21:55     Performed at [facility]   Culture     Final   Value:        BLOOD CULTURE RECEIVED NO GROWTH TO DATE CULTURE WILL BE HELD FOR 5 DAYS BEFORE ISSUING A FINAL NEGATIVE REPORT     Performed at [facility]   Report Status PENDING   Incomplete  URINE CULTURE     Status: None   Collection Time    10/30/12  9:39 AM      Result Value Range Status   Specimen Description URINE, CATHETERIZED   Final    Special Requests NONE   Final   Culture  Setup Time     Final   Value: 10/30/2012 21:11     Performed at [facility] Count     Final   Value: NO GROWTH     Performed at [facility]   Culture     Final   Value: NO GROWTH     Performed at [facility]   Report Status 11/01/2012 FINAL   Final    Studies/Results: No results found.    Assessment/Plan: [name] is a 79 y.o. male with  With MSSA bacteremia and Pacemaker also with LBPain   #1 MSSA bacteremia and Pacemaker = Pacemaker infection  --  Pacemaker needs to be removed have discussed with Dr. Aldine Contes and asked that Sharrell Ku from [facility] EP be consulted for consideration of explantation of device --obtain blood cultures post removal of PM --he should then have 6 weeks of IV ancef post pacemaker removal --do not place PICC line UNTIL we have #1 removed PM and #2 have negative blood cultures @ 48 hours PM removal --would obtain MRI L spine with contrast when PM i [place] to look for disk infection  #2 Pancytopenia: WBC down slightly with ANC but not far off baseline, monitor  #3 Screening: Low risk demographic but will check HIV, Hepatitis   LOS: 5 days   Acey Lav 11/01/2012, 11:57 AM

## 2012-11-02 DIAGNOSIS — D61818 Other pancytopenia: Secondary | ICD-10-CM

## 2012-11-02 DIAGNOSIS — B999 Unspecified infectious disease: Secondary | ICD-10-CM

## 2012-11-02 DIAGNOSIS — M545 Low back pain: Secondary | ICD-10-CM

## 2012-11-02 DIAGNOSIS — T827XXA Infection and inflammatory reaction due to other cardiac and vascular devices, implants and grafts, initial encounter: Principal | ICD-10-CM

## 2012-11-02 DIAGNOSIS — A4901 Methicillin susceptible Staphylococcus aureus infection, unspecified site: Secondary | ICD-10-CM

## 2012-11-02 DIAGNOSIS — E871 Hypo-osmolality and hyponatremia: Secondary | ICD-10-CM

## 2012-11-02 LAB — CBC WITH DIFFERENTIAL/PLATELET
Basophils Relative: 1 % (ref 0–1)
Eosinophils Absolute: 0 10*3/uL (ref 0.0–0.7)
Eosinophils Relative: 2 % (ref 0–5)
HCT: 25.5 % — ABNORMAL LOW (ref 39.0–52.0)
Hemoglobin: 8.7 g/dL — ABNORMAL LOW (ref 13.0–17.0)
Lymphs Abs: 0.6 10*3/uL — ABNORMAL LOW (ref 0.7–4.0)
MCH: 34.3 pg — ABNORMAL HIGH (ref 26.0–34.0)
MCHC: 34.1 g/dL (ref 30.0–36.0)
MCV: 100.4 fL — ABNORMAL HIGH (ref 78.0–100.0)
Monocytes Absolute: 0 10*3/uL — ABNORMAL LOW (ref 0.1–1.0)
Neutro Abs: 0.4 10*3/uL — ABNORMAL LOW (ref 1.7–7.7)
Neutrophils Relative %: 44 % (ref 43–77)

## 2012-11-02 LAB — BASIC METABOLIC PANEL
BUN: 11 mg/dL (ref 6–23)
CO2: 28 mEq/L (ref 19–32)
GFR calc Af Amer: >90 mL/min (ref >90)
GFR calc non Af Amer: 88 mL/min — ABNORMAL LOW (ref >90)
Glucose, Bld: 139 mg/dL — ABNORMAL HIGH (ref 70–99)
Potassium: 3.4 mEq/L — ABNORMAL LOW (ref 3.5–5.1)
Sodium: 132 mEq/L — ABNORMAL LOW (ref 135–145)

## 2012-11-02 LAB — SEDIMENTATION RATE: Sed Rate: 48 mm/hr — ABNORMAL HIGH (ref 0–16)

## 2012-11-02 LAB — HEPATITIS PANEL, ACUTE: Hep A IgM: NEGATIVE

## 2012-11-02 LAB — GLUCOSE, CAPILLARY
Glucose-Capillary: 160 mg/dL — ABNORMAL HIGH (ref 70–99)
Glucose-Capillary: 140 mg/dL — ABNORMAL HIGH (ref 70–99)
Glucose-Capillary: 166 mg/dL — ABNORMAL HIGH (ref 70–99)

## 2012-11-02 LAB — C-REACTIVE PROTEIN: CRP: 5.9 mg/dL — ABNORMAL HIGH (ref <0.60)

## 2012-11-02 NOTE — Consult Note (Addendum)
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 [name] is an 79 y.o. male.   HPI: The patient is a 79 year old man with a history of symptomatic bradycardia and paroxysmal atrial fibrillation status post pacemaker insertion in 2010. He presented to the hospital with fevers, chills, and with subsequent found to have blood cultures, positive for methicillin sensitive staph aureus. He has been treated with intravenous antibiotics and his symptoms and fevers have improved. He has chronic abdominal pain. He denies heart failure symptoms. He denies syncope. The patient denies any recent infections. His pacemaker has been interrogated. I have not had a chance to review this. By report he is pacemaker dependent though this has not been confirmed.  PMH: Past Medical History  Diagnosis Date  . Dyspnea   . 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   . Allergic rhinitis   . Hyperlipidemia   . Exogenous obesity   . Edema     venous insufficiency  . Diabetes mellitus 2012    T2DM  . Second degree Mobitz II AV block 12/11/08    with transient syncope, resolved s/p PPM  . Paroxysmal atrial fibrillation 03/16/11    diagnosed by PPM interrogation  . Pancytopenia   . Postural dizziness     PSHX: Past Surgical History  Procedure Laterality Date  . Lymph node biopsy      groin  . Sternotomy  2000  . Pericardiectomy  2000  . Penile prosthesis implant      and removal  . Pacemaker insertion  12/11/08    MDT implanted by Dr [name]  . Tee without cardioversion N/A 10/31/2012    Procedure: TRANSESOPHAGEAL ECHOCARDIOGRAM (TEE);  Surgeon: Victor Bunting, MD;  Location: [facility] ENDOSCOPY;  Service: Cardiovascular;  Laterality: N/A;    FAMHX: Family History  Problem Relation Age of Onset  . Diabetes Mother   . Liver cancer Brother   . Cancer Sister     Social  History:  reports that he quit smoking about 44 years ago. His smoking use included Cigarettes. He has a 30 pack-year smoking history. He has quit using smokeless tobacco. He reports that he does not drink alcohol or use illicit drugs.  Allergies: No Known Allergies  Medications: Reviewed  No results found.  ROS  As stated in the HPI and negative for all other systems.  Physical Exam  Vitals:Blood pressure 129/68, pulse 83, temperature 98.3 F (36.8 C), temperature source Oral, resp. rate 18, height 5\' 11"  (1.803 m), weight 252 lb 3.2 oz (114.397 kg), SpO2 97.00%.  Stable appearing elderly man,  NAD HEENT: Unremarkable Neck:  7 cm JVD, no thyromegally Back:  No CVA tenderness Lungs:  Clear except for scattered rales in the bases, with no wheezes, and no rhonchi. Pacemaker insertion site is without swelling. HEART:  Regular rate rhythm, no murmurs, no rubs, no clicks Abd:  Soft, obese,  positive bowel sounds, no organomegally, no rebound, no guarding Ext:  2 plus pulses, no edema, no cyanosis, no clubbing Skin:  No rashes no nodules Neuro:  CN II through XII intact, motor grossly intact  Chest x-ray - reviewed, sternotomy wires in place and dual-chamber pacemaker in place  ECG-  normal sinus rhythm with ventricular pacing  2-D echo - reviewed, no vegetation, or valvular regurgitation, preserved left ventricular function  Assessment/Plan: 1. staph aureus bacteremia 2. indwelling dual-chamber  pacemaker 3. history of high-grade heart block 4. history of median sternotomy for pericardiectomy Discussion: I discussed the treatment options with the patient in detail. Care of his staph aureus infection will require extraction of his permanent dual-chamber pacing system. Most likely, a temporary pacemaker will be placed at the time of extraction. He will continue on intravenous antibiotics as per the infectious disease team. I'll anticipate PICC line placement, 6 weeks of antibiotics, and  reinsertion of a new pacemaker. We'll schedule pacing system extraction in the next 48 hours. I discussed the risk, and if it's, holes, and expectations of pacing system extraction with the patient and his daughter, and they wish to proceed.  Victor Gowda TaylorMD 11/02/2012, 8:27 AM

## 2012-11-02 NOTE — Progress Notes (Signed)
[facility]    Subjective: No new complaints   Antibiotics:  Anti-infectives   Start     Dose/Rate Route Frequency Ordered Stop   10/30/12 0830  vancomycin (VANCOCIN) 1,250 mg in sodium chloride 0.9 % 250 mL IVPB  Status:  Discontinued     1,250 mg 166.7 mL/hr over 90 Minutes Intravenous 2 times daily 10/30/12 0806 10/30/12 1031   10/29/12 1800  ceFAZolin (ANCEF) IVPB 2 g/50 mL premix     2 g 100 mL/hr over 30 Minutes Intravenous Every 8 hours 10/29/12 1600     10/28/12 2000  vancomycin (VANCOCIN) IVPB 1000 mg/200 mL premix  Status:  Discontinued     1,000 mg 200 mL/hr over 60 Minutes Intravenous 2 times daily 10/28/12 1220 10/30/12 0806   10/28/12 0800  ceFEPIme (MAXIPIME) 2 g in dextrose 5 % 50 mL IVPB  Status:  Discontinued     2 g 100 mL/hr over 30 Minutes Intravenous Every 8 hours 10/28/12 0607 10/29/12 1600   10/28/12 0730  acyclovir (ZOVIRAX) 760 mg in dextrose 5 % 150 mL IVPB  Status:  Discontinued     760 mg 165.2 mL/hr over 60 Minutes Intravenous 3 times per day 10/28/12 0700 10/29/12 1600   10/28/12 0630  vancomycin (VANCOCIN) 1,250 mg in sodium chloride 0.9 % 250 mL IVPB  Status:  Discontinued     1,250 mg 166.7 mL/hr over 90 Minutes Intravenous 2 times daily 10/28/12 0607 10/28/12 1220   10/28/12 0145  ceFEPIme (MAXIPIME) 2 g in dextrose 5 % 50 mL IVPB     2 g 100 mL/hr over 30 Minutes Intravenous  Once 10/28/12 0130 10/28/12 0255      Medications: Scheduled Meds: .  ceFAZolin (ANCEF) IV  2 g Intravenous Q8H  . escitalopram  10 mg Oral QHS  . famotidine  20 mg Oral QHS  . finasteride  5 mg Oral q morning - 10a  . fluticasone  2 spray Each Nare Daily  . furosemide  20 mg Oral Daily  . insulin aspart  0-9 Units Subcutaneous TID WC  . loratadine  10 mg Oral Daily  . mometasone-formoterol  2 puff Inhalation BID  . oxybutynin  10 mg Oral q morning - 10a  . pantoprazole  40 mg Oral Daily  . potassium chloride  20 mEq Oral Daily  .  sodium chloride  3 mL Intravenous Q12H  . traZODone  50 mg Oral QHS   Continuous Infusions:  PRN Meds:.acetaminophen, acetaminophen, acetaminophen, acetaminophen, albuterol, dextromethorphan-guaiFENesin, hydrocortisone cream, LORazepam, ondansetron (ZOFRAN) IV, ondansetron   Objective: Weight change: -0.1 oz (-0.003 kg)  Intake/Output Summary (Last 24 hours) at 11/02/12 1554 Last data filed at 11/02/12 1326  Gross per 24 hour  Intake   1180 ml  Output   4275 ml  Net  -3095 ml   Blood pressure 133/75, pulse 87, temperature 98.1 F (36.7 C), temperature source Oral, resp. rate 18, height 5\' 11"  (1.803 m), weight 252 lb 3.2 oz (114.397 kg), SpO2 98.00%. Temp:  [98.1 F (36.7 C)-98.4 F (36.9 C)] 98.1 F (36.7 C) (10/08 1326) Pulse Rate:  [83-87] 87 (10/08 1326) Resp:  [18] 18 (10/08 1326) BP: (129-133)/(68-75) 133/75 mmHg (10/08 1326) SpO2:  [97 %-98 %] 98 % (10/08 1326) Weight:  [252 lb 3.2 oz (114.397 kg)] 252 lb 3.2 oz (114.397 kg) (10/08 0518)  Physical Exam: General: Alert and awake, oriented x3, not in any acute distress. HEENT: anicteric sclera, pupils reactive to  light and accommodation, EOMI CVS regular rate, normal r,  no murmurs,  Chest: clear to auscultation bilaterally, no wheezing, rales or rhonchi Abdomen: soft nontender, nondistended, normal bowel sounds, Extremities: no  clubbing or edema noted bilaterally, no deformities of spine Skin: no rashes, PM pocket is not red or fluctuant Neuro: nonfocal  Lab Results:  Recent Labs  11/01/12 0437 11/02/12 0445  WBC 0.9* 1.0*  HGB 8.7* 8.7*  HCT 24.9* 25.5*  PLT 65* 72*    BMET  Recent Labs  11/01/12 0437 11/02/12 0445  NA 129* 132*  K 3.0* 3.4*  CL 92* 95*  CO2 28 28  GLUCOSE 127* 139*  BUN 12 11  CREATININE 0.70 0.70  CALCIUM 8.8 8.9    Micro Results: Recent Results (from the past 240 hour(s))  CULTURE, BLOOD (ROUTINE X 2)     Status: None   Collection Time    10/28/12  2:10 AM      Result  Value Range Status   Specimen Description BLOOD RIGHT ANTECUBITAL   Final   Special Requests BOTTLES DRAWN AEROBIC AND ANAEROBIC 5CC   Final   Culture  Setup Time     Final   Value: 10/28/2012 08:57     Performed at [facility]   Culture     Final   Value: STAPHYLOCOCCUS AUREUS     Note: RIFAMPIN AND GENTAMICIN SHOULD NOT BE USED AS SINGLE DRUGS FOR TREATMENT OF STAPH INFECTIONS.     Note: Gram Stain Report Called to,Read Back By and Verified With: LINDSAY HUDSON ON 10/28/2012 AT 10:06P BY WILEJ     Performed at [facility]   Report Status 10/30/2012 FINAL   Final   Organism ID, Bacteria STAPHYLOCOCCUS AUREUS   Final  CULTURE, BLOOD (ROUTINE X 2)     Status: None   Collection Time    10/28/12  2:15 AM      Result Value Range Status   Specimen Description BLOOD RIGHT ARM   Final   Special Requests BOTTLES DRAWN AEROBIC AND ANAEROBIC 3CC   Final   Culture  Setup Time     Final   Value: 10/28/2012 08:57     Performed at [facility]   Culture     Final   Value: STAPHYLOCOCCUS AUREUS     Note: SUSCEPTIBILITIES PERFORMED ON PREVIOUS CULTURE WITHIN THE LAST 5 DAYS.     Note: Gram Stain Report Called to,Read Back By and Verified With: LINDSAY HUDSON ON 10/28/2012 AT 10:06P BY WILEJ     Performed at [facility]   Report Status 10/30/2012 FINAL   Final  GRAM STAIN     Status: None   Collection Time    10/28/12 12:40 PM      Result Value Range Status   Specimen Description CSF   Final   Special Requests Normal   Final   Gram Stain     Final   Value: CYTOSPIN PREP.     NO ORGANISMS SEEN     NO WBC SEEN     Gram Stain Report Called to,Read Back By and Verified With: DEUTSCH,J. AT 1702 ON 10.03.14 BY LOVE,T.   Report Status 10/28/2012 FINAL   Final  FUNGUS CULTURE W SMEAR     Status: None   Collection Time    10/28/12 12:40 PM      Result Value Range Status   Specimen Description CSF   Final   Special Requests NONE   Final  Fungal Smear     Final    Value: NO YEAST OR FUNGAL ELEMENTS SEEN     Performed at [facility]   Culture     Final   Value: CULTURE IN PROGRESS FOR FOUR WEEKS     Performed at [facility]   Report Status PENDING   Incomplete  STOOL CULTURE     Status: None   Collection Time    10/29/12  7:37 AM      Result Value Range Status   Specimen Description STOOL   Final   Special Requests NONE   Final   Culture     Final   Value: NO SALMONELLA, SHIGELLA, CAMPYLOBACTER, YERSINIA, OR E.COLI 0157:H7 ISOLATED     Performed at [facility]   Report Status 11/01/2012 FINAL   Final  CULTURE, BLOOD (ROUTINE X 2)     Status: None   Collection Time    10/29/12 12:00 PM      Result Value Range Status   Specimen Description BLOOD RIGHT ARM  3 ML IN [facility] BOTTLE   Final   Special Requests NONE   Final   Culture  Setup Time     Final   Value: 10/29/2012 19:06     Performed at [facility]   Culture     Final   Value:        BLOOD CULTURE RECEIVED NO GROWTH TO DATE CULTURE WILL BE HELD FOR 5 DAYS BEFORE ISSUING A FINAL NEGATIVE REPORT     Performed at [facility]   Report Status PENDING   Incomplete  CULTURE, BLOOD (ROUTINE X 2)     Status: None   Collection Time    10/29/12 12:18 PM      Result Value Range Status   Specimen Description BLOOD RIGHT HAND  4 ML IN [facility] BOTTLE   Final   Special Requests NONE   Final   Culture  Setup Time     Final   Value: 10/29/2012 21:55     Performed at [facility]   Culture     Final   Value:        BLOOD CULTURE RECEIVED NO GROWTH TO DATE CULTURE WILL BE HELD FOR 5 DAYS BEFORE ISSUING A FINAL NEGATIVE REPORT     Performed at [facility]   Report Status PENDING   Incomplete  URINE CULTURE     Status: None   Collection Time    10/30/12  9:39 AM      Result Value Range Status   Specimen Description URINE, CATHETERIZED   Final   Special Requests NONE   Final   Culture  Setup Time     Final   Value: 10/30/2012 21:11      Performed at [facility] Count     Final   Value: NO GROWTH     Performed at [facility]   Culture     Final   Value: NO GROWTH     Performed at [facility]   Report Status 11/01/2012 FINAL   Final    Studies/Results: No results found.    Assessment/Plan: [name] is a 79 y.o. male with  With MSSA bacteremia and Pacemaker also with LBPain   #1 MSSA bacteremia and Pacemaker = Pacemaker infection  Greatly appreciate Dr. Ladona Ridgel helping here and to take out PM on Friday, possibly with temporary PM  --obtain blood cultures  post removal of PM --he should then have 6 weeks of IV ancef post pacemaker removal --do not place PICC line UNTIL we have #1 removed PM and #2 have negative blood cultures @ 48 hours PM removal --would obtain MRI L spine with contrast when PM i sout to look for disk infection  #2 Pancytopenia: WBC down slightly with ANC but not far off baseline, monitor  #3 Screening: Low risk demographic but will check HIV, Hepatitis   LOS: 6 days   Acey Lav 11/02/2012, 3:54 PM

## 2012-11-02 NOTE — Progress Notes (Signed)
Pt had 7 beats of wide complex QRS, HR 82.  Pt resting comfortably in bed. Vpaced on the monitor.  Notified [facility] night floor coverage midlevel via text page.  Ardyth Gal, RN 11/02/2012

## 2012-11-02 NOTE — Progress Notes (Signed)
[facility] PROGRESS NOTE  Victor GARDEN WUJ:[id number] DOB: [birth year] DOA: 10/27/2012 PCP: Sandrea Hughs, MD  Assessment/Plan: #1 MSSA bacteremia/pacemaker infection/sepsis Patient with some clinical improvement. Patient is currently afebrile. Patient has been seen by cardiology for pacemaker removal and possible insertion of temporary pacemaker which will be done in the next 48 hours per cardiology. CT of the abdomen and pelvis was negative. TEE which was done on 10/31/2012 was negative for any vegetations. Once pacemaker is removed we'll need to repeat blood cultures. If blood cultures remain negative after 48 hours post pacemaker removal will likely need a PICC line for long-term IV antibiotics. Patient states back pain is improved. Continue empiric IV Ancef for approximately 6 weeks. ID and cardiology following and appreciate input and recommendations.  #2 acute encephalopathy Clinical improvement. CT head was negative. Likely secondary to problem #1. Continue empiric IV antibiotics. Follow.  #3 pancytopenia Relatively chronic in nature and secondary to myelodysplastic syndrome. Patient's white count is essentially at baseline. Patient is status post 2 units packed red blood cells hemoglobin is currently 8.7. Platelet count is currently 72. Patient has been seen by oncology and feel patient's pancytopenia is secondary to myelodysplastic syndrome. No need for any further transfusion no growth factor support per oncology. Follow.  #4 hypokalemia Secondary to diuretics. Replete.  #5 abdominal pain Improvement. CT abdomen and pelvis negative. Tolerating current diet. Continue supportive care.  #6 low back pain Patient states low back pain improving. Plain films negative. If continued pain post pacemaker removal may consider MRI of the L-spine.  #7 grade 2 diastolic dysfunction . 2-D echo of 2013. 2-D echo done 10/30/2012 with a normal systolic function and EF of 55%. IV fluids have  been discontinued. Patient's baseline weight is 258 pounds. Current weight is 252 pounds. Patient on oral Lasix. Follow.  #8 hyponatremia Improved on diuretics. Follow.  #9 follicular lymphoma grade 3 diagnosed 2007 In remission per oncology. Followup as outpatient.  #10 prophylaxis Protonix for GI prophylaxis. SCDs for DVT prophylaxis.  Code Status: Full Family Communication: Updated patient, son, daughter at bedside. Disposition Plan: Home when medically stable   Consultants: ID: Dr Ninetta Lights 10/29/12 Cardiology for TEE and pacemaker removal: Dr Ladona Ridgel 11/02/12 Oncology: Dr Clelia Croft 11/02/12   Procedures: Dg Chest 2 View 10/28/2012  Cardiomegaly, vascular congestion.  Dg Thoracic Spine 2 View 10/28/2012  No fracture or spondylolisthesis. Mild degenerative changes, stable from the prior lateral chest radiograph.  Dg Lumbar Spine 2-3 Views 10/28/2012  No fracture or acute finding. Disk degenerative changes at L1-L2. Mild dextroscoliosis.  Ct Head Wo Contrast 10/28/2012  Stable appearance of the the brain with no acute intra five. Ethmoidal sinus disease.  Ct Angio Chest Pe W/cm &/or Wo Cm 10/28/2012  No evidence of pulmonary embolus. Cardiomegaly.  Ct Abdomen Pelvis W Contrast 10/28/2012  No acute findings in the abdomen or pelvis. Fat density within the wall of the right colon may reflect prior inflammatory bowel disease. No active inflammation or acute findings currently.  Dg Fluoro Guide Ndl Plc/bx 10/28/2012  Successful diagnostic lumbar puncture under fluoroscopic guidance at the level of L3-4. No evidence of immediate complication A 2-D echo 10/30/2012 TEE 10/31/2012 2 units packed red blood cells.    Antibiotics: Vancomycin 10/02 --> 10/05  Maxipime 10/02 --> 10/05  Acyclovir 10/02 --> 10/05  Ancef 10/05 -->   HPI/Subjective: Patient states he's feeling better. No chest pain. Patient states back pain improvement. No complaints.  Objective: Filed Vitals:   11/02/12 1326  BP: 133/75  Pulse: 87  Temp: 98.1 F (36.7 C)  Resp: 18    Intake/Output Summary (Last 24 hours) at 11/02/12 1352 Last data filed at 11/02/12 1326  Gross per 24 hour  Intake   1420 ml  Output   4275 ml  Net  -2855 ml   Filed Weights   10/31/12 0617 11/01/12 0700 11/02/12 0518  Weight: 116.4 kg (256 lb 9.9 oz) 114.4 kg (252 lb 3.3 oz) 114.397 kg (252 lb 3.2 oz)    Exam:   General:  NAD  Cardiovascular: RRR  Respiratory: CTAB  Abdomen: Soft/NT/ND/+BS  Musculoskeletal: No c/c/e  Data Reviewed: Basic Metabolic Panel:  Recent Labs Lab 10/29/12 0510 10/30/12 0723 10/31/12 0425 11/01/12 0437 11/02/12 0445  NA 126* 126* 129* 129* 132*  K 3.9 3.6 3.2* 3.0* 3.4*  CL 94* 92* 94* 92* 95*  CO2 21 23 27 28 28   GLUCOSE 154* 133* 129* 127* 139*  BUN 18 14 13 12 11   CREATININE 0.87 0.74 0.75 0.70 0.70  CALCIUM 8.3* 8.5 8.9 8.8 8.9   Liver Function Tests:  Recent Labs Lab 10/27/12 2330 10/28/12 0655 10/30/12 0723  AST 18 16 27   ALT 17 15 21   ALKPHOS 50 38* 39  BILITOT 1.5* 1.4* 0.8  PROT 6.7 5.9* 6.1  ALBUMIN 3.9 3.3* 3.1*   No results found for this basename: LIPASE, AMYLASE,  in the last 168 hours  Recent Labs Lab 10/28/12 0840  AMMONIA 36   CBC:  Recent Labs Lab 10/27/12 2330  10/29/12 0510 10/30/12 0723 10/31/12 0425 11/01/12 0437 11/02/12 0445  WBC 1.8*  < > 1.6* 1.7* 1.3* 0.9* 1.0*  NEUTROABS 1.6*  --   --  1.3*  --   --  0.4*  HGB 8.9*  < > 7.5* 9.0* 9.0* 8.7* 8.7*  HCT 25.6*  < > 21.1* 25.8* 25.8* 24.9* 25.5*  MCV 105.8*  < > 104.5* 100.0 99.6 100.4* 100.4*  PLT 97*  < > 74* 64* 73* 65* 72*  < > = values in this interval not displayed. Cardiac Enzymes:  Recent Labs Lab 10/28/12 0210  CKTOTAL 92  TROPONINI <0.30   BNP (last 3 results)  Recent Labs  10/28/12 0210 10/29/12 0510 10/31/12 0425  PROBNP 1971.0* 3717.0* 5067.0*   CBG:  Recent Labs Lab 11/01/12 0744 11/01/12 1214 11/01/12 1726 11/02/12 0754 11/02/12 1146   GLUCAP 110* 127* 125* 124* 160*    Recent Results (from the past 240 hour(s))  CULTURE, BLOOD (ROUTINE X 2)     Status: None   Collection Time    10/28/12  2:10 AM      Result Value Range Status   Specimen Description BLOOD RIGHT ANTECUBITAL   Final   Special Requests BOTTLES DRAWN AEROBIC AND ANAEROBIC 5CC   Final   Culture  Setup Time     Final   Value: 10/28/2012 08:57     Performed at [facility]   Culture     Final   Value: STAPHYLOCOCCUS AUREUS     Note: RIFAMPIN AND GENTAMICIN SHOULD NOT BE USED AS SINGLE DRUGS FOR TREATMENT OF STAPH INFECTIONS.     Note: Gram Stain Report Called to,Read Back By and Verified With: LINDSAY HUDSON ON 10/28/2012 AT 10:06P BY WILEJ     Performed at [facility]   Report Status 10/30/2012 FINAL   Final   Organism ID, Bacteria STAPHYLOCOCCUS AUREUS   Final  CULTURE, BLOOD (ROUTINE X 2)     Status:  None   Collection Time    10/28/12  2:15 AM      Result Value Range Status   Specimen Description BLOOD RIGHT ARM   Final   Special Requests BOTTLES DRAWN AEROBIC AND ANAEROBIC 3CC   Final   Culture  Setup Time     Final   Value: 10/28/2012 08:57     Performed at [facility]   Culture     Final   Value: STAPHYLOCOCCUS AUREUS     Note: SUSCEPTIBILITIES PERFORMED ON PREVIOUS CULTURE WITHIN THE LAST 5 DAYS.     Note: Gram Stain Report Called to,Read Back By and Verified With: LINDSAY HUDSON ON 10/28/2012 AT 10:06P BY WILEJ     Performed at [facility]   Report Status 10/30/2012 FINAL   Final  GRAM STAIN     Status: None   Collection Time    10/28/12 12:40 PM      Result Value Range Status   Specimen Description CSF   Final   Special Requests Normal   Final   Gram Stain     Final   Value: CYTOSPIN PREP.     NO ORGANISMS SEEN     NO WBC SEEN     Gram Stain Report Called to,Read Back By and Verified With: DEUTSCH,J. AT 1702 ON 10.03.14 BY LOVE,T.   Report Status 10/28/2012 FINAL   Final  FUNGUS CULTURE W SMEAR      Status: None   Collection Time    10/28/12 12:40 PM      Result Value Range Status   Specimen Description CSF   Final   Special Requests NONE   Final   Fungal Smear     Final   Value: NO YEAST OR FUNGAL ELEMENTS SEEN     Performed at [facility]   Culture     Final   Value: CULTURE IN PROGRESS FOR FOUR WEEKS     Performed at [facility]   Report Status PENDING   Incomplete  STOOL CULTURE     Status: None   Collection Time    10/29/12  7:37 AM      Result Value Range Status   Specimen Description STOOL   Final   Special Requests NONE   Final   Culture     Final   Value: NO SALMONELLA, SHIGELLA, CAMPYLOBACTER, YERSINIA, OR E.COLI 0157:H7 ISOLATED     Performed at [facility]   Report Status 11/01/2012 FINAL   Final  CULTURE, BLOOD (ROUTINE X 2)     Status: None   Collection Time    10/29/12 12:00 PM      Result Value Range Status   Specimen Description BLOOD RIGHT ARM  3 ML IN [facility] BOTTLE   Final   Special Requests NONE   Final   Culture  Setup Time     Final   Value: 10/29/2012 19:06     Performed at [facility]   Culture     Final   Value:        BLOOD CULTURE RECEIVED NO GROWTH TO DATE CULTURE WILL BE HELD FOR 5 DAYS BEFORE ISSUING A FINAL NEGATIVE REPORT     Performed at [facility]   Report Status PENDING   Incomplete  CULTURE, BLOOD (ROUTINE X 2)     Status: None   Collection Time    10/29/12 12:18 PM      Result Value Range Status   Specimen  Description BLOOD RIGHT HAND  4 ML IN [facility] BOTTLE   Final   Special Requests NONE   Final   Culture  Setup Time     Final   Value: 10/29/2012 21:55     Performed at [facility]   Culture     Final   Value:        BLOOD CULTURE RECEIVED NO GROWTH TO DATE CULTURE WILL BE HELD FOR 5 DAYS BEFORE ISSUING A FINAL NEGATIVE REPORT     Performed at [facility]   Report Status PENDING   Incomplete  URINE CULTURE     Status: None   Collection Time    10/30/12  9:39 AM       Result Value Range Status   Specimen Description URINE, CATHETERIZED   Final   Special Requests NONE   Final   Culture  Setup Time     Final   Value: 10/30/2012 21:11     Performed at [facility] Count     Final   Value: NO GROWTH     Performed at [facility]   Culture     Final   Value: NO GROWTH     Performed at [facility]   Report Status 11/01/2012 FINAL   Final     Studies: No results found.  Scheduled Meds: .  ceFAZolin (ANCEF) IV  2 g Intravenous Q8H  . escitalopram  10 mg Oral QHS  . famotidine  20 mg Oral QHS  . finasteride  5 mg Oral q morning - 10a  . fluticasone  2 spray Each Nare Daily  . furosemide  20 mg Oral Daily  . insulin aspart  0-9 Units Subcutaneous TID WC  . loratadine  10 mg Oral Daily  . mometasone-formoterol  2 puff Inhalation BID  . oxybutynin  10 mg Oral q morning - 10a  . pantoprazole  40 mg Oral Daily  . potassium chloride  20 mEq Oral Daily  . sodium chloride  3 mL Intravenous Q12H  . traZODone  50 mg Oral QHS   Continuous Infusions:   Principal Problem:   Bacteremia due to Staphylococcus aureus Active Problems:   LYMPHOMA NEC, MLIG, INGUINAL/LOWER LIMB   Essential hypertension, benign   GERD   DM (diabetes mellitus), secondary uncontrolled   Weakness   Abdominal pain   Acute encephalopathy   Other pancytopenia   Hyponatremia   Sepsis   Pacemaker infection    Time spent: > 35 mins    [facility]  [facility] Pager [phone]. If 7PM-7AM, please contact night-coverage at [email or web address], password [facility] 11/02/2012, 1:52 PM  LOS: 6 days

## 2012-11-02 NOTE — Evaluation (Signed)
Physical Therapy Evaluation Patient Details Name: [name] MRN: [id number] DOB: 1932-06-15 Today's Date: 11/02/2012 Time: 4098-1191 PT Time Calculation (min): 8 min  PT Assessment / Plan / Recommendation History of Present Illness  The patient is a 79 year old man with a history of symptomatic bradycardia and paroxysmal atrial fibrillation status post pacemaker insertion in 2010. He presented to the hospital with fevers, chills, and with subsequent found to have blood cultures, positive for methicillin sensitive staph aureus. He has been treated with intravenous antibiotics and his symptoms and fevers have improved. He has chronic abdominal pain. He denies heart failure symptoms. He denies syncope. The patient denies any recent infections  Clinical Impression  On eval, pt required Min guard to Min assist for mobility-able to ambulate ~150 feet without device. Noted pt is scheduled for procedure with cardiology within next few days. Will keep pt on caseload and follow during stay.     PT Assessment  Patient needs continued PT services    Follow Up Recommendations  No PT follow up    Does the patient have the potential to tolerate intense rehabilitation      Barriers to Discharge        Equipment Recommendations  None recommended by PT    Recommendations for Other Services OT consult   Frequency Min 3X/week    Precautions / Restrictions Precautions Precautions: Fall Restrictions Weight Bearing Restrictions: No   Pertinent Vitals/Pain Abdomen 5/10      Mobility  Bed Mobility Bed Mobility: Not assessed Details for Bed Mobility Assistance: pt sitting in chair with family at bedside Transfers Transfers: Sit to Stand;Stand to Sit Sit to Stand: 4: Min assist;From chair/3-in-1 Stand to Sit: 4: Min assist;To chair/3-in-1 Details for Transfer Assistance: Assist to rise, steady, control descent from/to chair without arms Ambulation/Gait Ambulation/Gait Assistance: 4: Min  guard Ambulation Distance (Feet): 150 Feet Assistive device: None Gait Pattern: Step-through pattern    Exercises     PT Diagnosis: Difficulty walking;Generalized weakness  PT Problem List: Decreased strength;Decreased mobility;Decreased balance;Decreased activity tolerance PT Treatment Interventions: DME instruction;Gait training;Functional mobility training;Therapeutic activities;Therapeutic exercise;Patient/family education     PT Goals(Current goals can be found in the care plan section) Acute Rehab PT Goals Patient Stated Goal: home. get better PT Goal Formulation: With patient Time For Goal Achievement: 11/16/12 Potential to Achieve Goals: Good  Visit Information  Last PT Received On: 11/02/12 Assistance Needed: +1 History of Present Illness: The patient is a 79 year old man with a history of symptomatic bradycardia and paroxysmal atrial fibrillation status post pacemaker insertion in 2010. He presented to the hospital with fevers, chills, and with subsequent found to have blood cultures, positive for methicillin sensitive staph aureus. He has been treated with intravenous antibiotics and his symptoms and fevers have improved. He has chronic abdominal pain. He denies heart failure symptoms. He denies syncope. The patient denies any recent infections       Prior Functioning  Home Living Family/patient expects to be discharged to:: Private residence Living Arrangements: Alone Available Help at Discharge: Family Type of Home: House Home Access: Level entry Home Layout: One level Home Equipment: Environmental consultant - 4 wheels;Cane - single point Prior Function Level of Independence: Independent Communication Communication: No difficulties    Cognition  Cognition Arousal/Alertness: Awake/alert Behavior During Therapy: WFL for tasks assessed/performed Overall Cognitive Status: Within Functional Limits for tasks assessed    Extremity/Trunk Assessment Upper Extremity Assessment Upper  Extremity Assessment: Generalized weakness Lower Extremity Assessment Lower Extremity Assessment: Generalized weakness  Balance    End of Session PT - End of Session Equipment Utilized During Treatment: Gait belt Activity Tolerance: Patient limited by fatigue Patient left: in chair;with call bell/phone within reach;with family/visitor present  GP     Rebeca Alert, MPT Pager: [phone]

## 2012-11-02 NOTE — Plan of Care (Signed)
Problem: Phase III Progression Outcomes Goal: Activity at appropriate level-compared to baseline (UP IN CHAIR FOR HEMODIALYSIS)  Outcome: Progressing Pt walked ~150 feet with +1 assist with PT today.

## 2012-11-02 NOTE — Evaluation (Signed)
Occupational Therapy Evaluation Patient Details Name: [name] MRN: [id number] DOB: 10/06/1932 Today's Date: 11/02/2012 Time: 1315-1340 OT Time Calculation (min): 25 min  OT Assessment / Plan / Recommendation History of present illness The patient is a 79 year old man with a history of symptomatic bradycardia and paroxysmal atrial fibrillation status post pacemaker insertion in 2010. He presented to the hospital with fevers, chills, and with subsequent found to have blood cultures, positive for methicillin sensitive staph aureus. He has been treated with intravenous antibiotics and his symptoms and fevers have improved. He has chronic abdominal pain. He denies heart failure symptoms. He denies syncope. The patient denies any recent infections   Clinical Impression    Pt presents to OT with problems listed below. Pt will benefit from skilled OT to increase I with ADL activity and return to PLOF    OT Assessment  Patient needs continued OT Services    Follow Up Recommendations  Home health OT       Equipment Recommendations  None recommended by OT       Frequency  Min 2X/week    Precautions / Restrictions Precautions Precautions: Fall       ADL  Grooming: Set up Where Assessed - Grooming: Supported sitting Upper Body Bathing: Minimal assistance Where Assessed - Upper Body Bathing: Supported sitting Lower Body Bathing: Maximal assistance Where Assessed - Lower Body Bathing: Supported sit to stand Upper Body Dressing: Minimal assistance Where Assessed - Upper Body Dressing: Supported sitting Lower Body Dressing: Maximal assistance Where Assessed - Lower Body Dressing: Supported sit to Pharmacist, hospital: Moderate assistance Toilet Transfer Method: Sit to stand Where Assessed - Toileting Clothing Manipulation and Hygiene: Standing ADL Comments: Multiple family members present with pt.  Educated pt on fall prevention safety    OT Diagnosis: Generalized weakness   OT Problem List: Decreased strength;Impaired balance (sitting and/or standing);Decreased activity tolerance OT Treatment Interventions: Self-care/ADL training;Patient/family education;DME and/or AE instruction;Energy conservation   OT Goals(Current goals can be found in the care plan section) Acute Rehab OT Goals Patient Stated Goal: get stronger and get this fixed ( infection ) OT Goal Formulation: With patient Time For Goal Achievement: 11/16/12 Potential to Achieve Goals: Good ADL Goals Pt Will Perform Grooming: with supervision;standing Pt Will Perform Upper Body Bathing: with supervision;sitting Pt Will Perform Upper Body Dressing: with supervision;sitting Pt Will Perform Lower Body Dressing: with supervision;sit to/from stand Pt Will Transfer to Toilet: with supervision;regular height toilet;ambulating Pt Will Perform Toileting - Clothing Manipulation and hygiene: with supervision;sit to/from stand  Visit Information  Last OT Received On: 11/02/12 Assistance Needed: +1 History of Present Illness: The patient is a 79 year old man with a history of symptomatic bradycardia and paroxysmal atrial fibrillation status post pacemaker insertion in 2010. He presented to the hospital with fevers, chills, and with subsequent found to have blood cultures, positive for methicillin sensitive staph aureus. He has been treated with intravenous antibiotics and his symptoms and fevers have improved. He has chronic abdominal pain. He denies heart failure symptoms. He denies syncope. The patient denies any recent infections       Prior Functioning     Home Living Family/patient expects to be discharged to:: Private residence Living Arrangements: Alone Available Help at Discharge: Family Type of Home: House Home Access: Level entry Home Layout: One level Home Equipment: Environmental consultant - 4 wheels Prior Function Level of Independence: Independent Communication Communication: No difficulties          Vision/Perception Vision - History Baseline Vision: Wears  glasses all the time   Cognition  Cognition Arousal/Alertness: Awake/alert Behavior During Therapy: WFL for tasks assessed/performed Overall Cognitive Status: Within Functional Limits for tasks assessed    Extremity/Trunk Assessment Upper Extremity Assessment Upper Extremity Assessment: Generalized weakness     Mobility Bed Mobility Bed Mobility: Not assessed           End of Session OT - End of Session Activity Tolerance: Patient tolerated treatment well Patient left: in chair;with call bell/phone within reach;with family/visitor present Nurse Communication: Mobility status  GO     Alba Cory 11/02/2012, 1:45 PM

## 2012-11-02 NOTE — Progress Notes (Signed)
IP PROGRESS NOTE  Subjective:   Principle Diagnosis: A 79 year old gentleman with the following diagnoses:  1. Follicular lymphoma as a grade 3, stage III, diagnosed 2007, continued to be in remission. 2. Pancytopenia associated with mild neutropenia and macrocytosis likely due to myelodysplasia. 3. Monoclonal gammopathy of undetermined significance.  Prior Therapy:  1. Status post CHOP and rituximab. He had complete response to therapy concluded in 2007. No further imaging has been indicated at this time. 2. Status post bone marrow biopsy in April 2011, did not really show any evidence of myelodysplasia or metastatic lymphoma at this time.  Current therapy: Aranesp 300 mcg every 3 weeks to keep his hemoglobin close to 11.  Patient was hospitalized with weakness and found to have MSSA bacteremia due to pacemaker infection.  He is on antibiotics and clinically appear stable without complaints.   Objective:  Vital signs in last 24 hours: Temp:  [97.8 F (36.6 C)-98.4 F (36.9 C)] 98.1 F (36.7 C) (10/08 1326) Pulse Rate:  [83-87] 87 (10/08 1326) Resp:  [18] 18 (10/08 1326) BP: (105-133)/(47-75) 133/75 mmHg (10/08 1326) SpO2:  [95 %-98 %] 98 % (10/08 1326) Weight:  [252 lb 3.2 oz (114.397 kg)] 252 lb 3.2 oz (114.397 kg) (10/08 0518) Weight change: -0.1 oz (-0.003 kg) Last BM Date: 10/29/12  Intake/Output from previous day: 10/07 0701 - 10/08 0700 In: 870 [P.O.:720; IV Piggyback:150] Out: 3950 [Urine:3950]  Mouth: mucous membranes moist, pharynx normal without lesions Resp: clear to auscultation bilaterally Cardio: regular rate and rhythm, S1, S2 normal, no murmur, click, rub or gallop GI: soft, non-tender; bowel sounds normal; no masses,  no organomegaly Extremities: extremities normal, atraumatic, no cyanosis or edema    Lab Results:  Recent Labs  11/01/12 0437 11/02/12 0445  WBC 0.9* 1.0*  HGB 8.7* 8.7*  HCT 24.9* 25.5*  PLT 65* 72*    BMET  Recent Labs  11/01/12 0437 11/02/12 0445  NA 129* 132*  K 3.0* 3.4*  CL 92* 95*  CO2 28 28  GLUCOSE 127* 139*  BUN 12 11  CREATININE 0.70 0.70  CALCIUM 8.8 8.9    Studies/Results: No results found.  Medications: I have reviewed the patient's current medications.  Assessment/Plan:  This is a 79 year old gentleman with the following issues:  1.Pancytopenia due to myelodysplastic syndrome is most likely etiology. He is status post bone marrow biopsy was done on 06/22/2012. Counts are stable and close to base line. I see no need for transfusion or growth factor support.   2.  MSSA bacteremia due to pacemaker infection: On antibiotics and likely need his hardware to be removed. I see no objections from counts stand point. His platelets are above 50 K which it should be more than enough for that surgery.  His ANC is around 400 and no signs of sepsis at this time.    LOS: 6 days   Kandie Keiper 11/02/2012, 1:40 PM

## 2012-11-03 DIAGNOSIS — A419 Sepsis, unspecified organism: Secondary | ICD-10-CM

## 2012-11-03 DIAGNOSIS — I4891 Unspecified atrial fibrillation: Secondary | ICD-10-CM

## 2012-11-03 DIAGNOSIS — R7881 Bacteremia: Secondary | ICD-10-CM

## 2012-11-03 LAB — CBC WITH DIFFERENTIAL/PLATELET
Basophils Absolute: 0 10*3/uL (ref 0.0–0.1)
Eosinophils Absolute: 0 10*3/uL (ref 0.0–0.7)
Eosinophils Relative: 2 % (ref 0–5)
HCT: 25.3 % — ABNORMAL LOW (ref 39.0–52.0)
Lymphs Abs: 0.5 10*3/uL — ABNORMAL LOW (ref 0.7–4.0)
MCH: 34.3 pg — ABNORMAL HIGH (ref 26.0–34.0)
MCHC: 33.6 g/dL (ref 30.0–36.0)
MCV: 102 fL — ABNORMAL HIGH (ref 78.0–100.0)
Monocytes Absolute: 0 10*3/uL — ABNORMAL LOW (ref 0.1–1.0)
Neutro Abs: 0.8 10*3/uL — ABNORMAL LOW (ref 1.7–7.7)
RBC: 2.48 MIL/uL — ABNORMAL LOW (ref 4.22–5.81)
RDW: 19.6 % — ABNORMAL HIGH (ref 11.5–15.5)

## 2012-11-03 LAB — BASIC METABOLIC PANEL
BUN: 11 mg/dL (ref 6–23)
Creatinine, Ser: 0.75 mg/dL (ref 0.50–1.35)
GFR calc Af Amer: >90 mL/min (ref >90)
GFR calc non Af Amer: 86 mL/min — ABNORMAL LOW (ref >90)
Glucose, Bld: 130 mg/dL — ABNORMAL HIGH (ref 70–99)
Potassium: 3.4 mEq/L — ABNORMAL LOW (ref 3.5–5.1)

## 2012-11-03 LAB — GLUCOSE, CAPILLARY

## 2012-11-03 LAB — HIV-1 RNA QUANT-NO REFLEX-BLD
HIV 1 RNA Quant: <20 copies/mL (ref <20)
HIV-1 RNA Quant, Log: <1.3 {Log} (ref <1.30)

## 2012-11-03 MED ORDER — CHLORHEXIDINE GLUCONATE 4 % EX LIQD: 60.0000 mL | Freq: Once | CUTANEOUS | Status: DC

## 2012-11-03 MED ORDER — CHLORHEXIDINE GLUCONATE 4 % EX LIQD
60.0000 mL | Freq: Once | CUTANEOUS | Status: AC
  Administered 2012-11-04: 4 via TOPICAL
  Filled 2012-11-03 (×2): qty 60

## 2012-11-03 MED ORDER — SODIUM CHLORIDE 0.9 % IR SOLN
80.0000 mg | Status: AC
  Filled 2012-11-03: qty 2

## 2012-11-03 MED ORDER — SODIUM CHLORIDE 0.9 % IV SOLN
INTRAVENOUS | Status: DC
  Administered 2012-11-04: 05:00:00 via INTRAVENOUS

## 2012-11-03 NOTE — Progress Notes (Signed)
Report called Inetta Fermo, RN on dept 3 West at [facility]. [place] called for transportation. Maeola Harman

## 2012-11-03 NOTE — Progress Notes (Signed)
[facility] PROGRESS NOTE  [name] WJX:[id number] DOB: 03/11/1932 DOA: 10/27/2012 PCP: Sandrea Hughs, MD  Assessment/Plan: #1 MSSA bacteremia/pacemaker infection/sepsis Patient with some clinical improvement. Patient is currently afebrile. Patient has been seen by cardiology for pacemaker removal and possible insertion of temporary pacemaker which will be done in the next 24 hours per cardiology. CT of the abdomen and pelvis was negative. TEE which was done on 10/31/2012 was negative for any vegetations. Once pacemaker is removed will need to repeat blood cultures. If blood cultures remain negative after 48 hours post pacemaker removal will likely need a PICC line for long-term IV antibiotics. Patient states back pain is improved. Continue empiric IV Ancef for approximately 6 weeks. ID and cardiology following and appreciate input and recommendations. Per nursing cardiology requesting patient be transferred to [facility] today in preparation for PPM removal tomorrow.  #2 acute encephalopathy Clinical improvement. CT head was negative. Likely secondary to problem #1. Continue empiric IV antibiotics. Follow.  #3 pancytopenia Relatively chronic in nature and secondary to myelodysplastic syndrome. Patient's white count is essentially at baseline. Patient is status post 2 units packed red blood cells hemoglobin is currently 8.5. Platelet count is currently 74. Patient has been seen by oncology and feel patient's pancytopenia is secondary to myelodysplastic syndrome. No need for any further transfusion or growth factor support per oncology. Follow.  #4 hypokalemia Secondary to diuretics. Replete.  #5 abdominal pain Improvement. CT abdomen and pelvis negative. Tolerating current diet. Continue supportive care.  #6 low back pain Patient states low back pain improving. Plain films negative. If continued pain, post pacemaker removal may consider MRI of the L-spine.  #7 grade 2 diastolic  dysfunction . 2-D echo of 2013. 2-D echo done 10/30/2012 with a normal systolic function and EF of 55%. IV fluids have been discontinued. Patient's baseline weight is 258 pounds. Current weight is 252 pounds. Patient on oral Lasix. Follow.  #8 hyponatremia Improved on diuretics. Follow.  #9 follicular lymphoma grade 3 diagnosed 2007 In remission per oncology. Followup as outpatient.  #10 prophylaxis Protonix for GI prophylaxis. SCDs for DVT prophylaxis.  Code Status: Full Family Communication: Updated patient, son, daughter at bedside. Disposition Plan: Transfer to [facility] for Pacemaker removal.   Consultants: ID: Dr Ninetta Lights 10/29/12 Cardiology for TEE and pacemaker removal: Dr Ladona Ridgel 11/02/12 Oncology: Dr Clelia Croft 11/02/12   Procedures: Dg Chest 2 View 10/28/2012  Cardiomegaly, vascular congestion.  Dg Thoracic Spine 2 View 10/28/2012  No fracture or spondylolisthesis. Mild degenerative changes, stable from the prior lateral chest radiograph.  Dg Lumbar Spine 2-3 Views 10/28/2012  No fracture or acute finding. Disk degenerative changes at L1-L2. Mild dextroscoliosis.  Ct Head Wo Contrast 10/28/2012  Stable appearance of the the brain with no acute intra five. Ethmoidal sinus disease.  Ct Angio Chest Pe W/cm &/or Wo Cm 10/28/2012  No evidence of pulmonary embolus. Cardiomegaly.  Ct Abdomen Pelvis W Contrast 10/28/2012  No acute findings in the abdomen or pelvis. Fat density within the wall of the right colon may reflect prior inflammatory bowel disease. No active inflammation or acute findings currently.  Dg Fluoro Guide Ndl Plc/bx 10/28/2012  Successful diagnostic lumbar puncture under fluoroscopic guidance at the level of L3-4. No evidence of immediate complication A 2-D echo 10/30/2012 TEE 10/31/2012 2 units packed red blood cells.    Antibiotics: Vancomycin 10/02 --> 10/05  Maxipime 10/02 --> 10/05  Acyclovir 10/02 --> 10/05  Ancef 10/05  -->   HPI/Subjective: Patient states he's  feeling better. No chest pain. Patient states back pain improvement. No complaints.  Objective: Filed Vitals:   11/03/12 0519  BP: 101/59  Pulse: 78  Temp: 97.7 F (36.5 C)  Resp: 18    Intake/Output Summary (Last 24 hours) at 11/03/12 1218 Last data filed at 11/03/12 1100  Gross per 24 hour  Intake   1490 ml  Output   2925 ml  Net  -1435 ml   Filed Weights   11/01/12 0700 11/02/12 0518 11/03/12 0519  Weight: 114.4 kg (252 lb 3.3 oz) 114.397 kg (252 lb 3.2 oz) 114.352 kg (252 lb 1.6 oz)    Exam:   General:  NAD  Cardiovascular: RRR  Respiratory: CTAB  Abdomen: Soft/NT/ND/+BS  Musculoskeletal: No c/c/e  Data Reviewed: Basic Metabolic Panel:  Recent Labs Lab 10/30/12 0723 10/31/12 0425 11/01/12 0437 11/02/12 0445 11/03/12 0410  NA 126* 129* 129* 132* 136  K 3.6 3.2* 3.0* 3.4* 3.4*  CL 92* 94* 92* 95* 97  CO2 23 27 28 28 30   GLUCOSE 133* 129* 127* 139* 130*  BUN 14 13 12 11 11   CREATININE 0.74 0.75 0.70 0.70 0.75  CALCIUM 8.5 8.9 8.8 8.9 8.9   Liver Function Tests:  Recent Labs Lab 10/27/12 2330 10/28/12 0655 10/30/12 0723  AST 18 16 27   ALT 17 15 21   ALKPHOS 50 38* 39  BILITOT 1.5* 1.4* 0.8  PROT 6.7 5.9* 6.1  ALBUMIN 3.9 3.3* 3.1*   No results found for this basename: LIPASE, AMYLASE,  in the last 168 hours  Recent Labs Lab 10/28/12 0840  AMMONIA 36   CBC:  Recent Labs Lab 10/27/12 2330  10/30/12 0723 10/31/12 0425 11/01/12 0437 11/02/12 0445 11/03/12 0410  WBC 1.8*  < > 1.7* 1.3* 0.9* 1.0* 1.3*  NEUTROABS 1.6*  --  1.3*  --   --  0.4* 0.8*  HGB 8.9*  < > 9.0* 9.0* 8.7* 8.7* 8.5*  HCT 25.6*  < > 25.8* 25.8* 24.9* 25.5* 25.3*  MCV 105.8*  < > 100.0 99.6 100.4* 100.4* 102.0*  PLT 97*  < > 64* 73* 65* 72* 74*  < > = values in this interval not displayed. Cardiac Enzymes:  Recent Labs Lab 10/28/12 0210  CKTOTAL 92  TROPONINI <0.30   BNP (last 3 results)  Recent Labs   10/28/12 0210 10/29/12 0510 10/31/12 0425  PROBNP 1971.0* 3717.0* 5067.0*   CBG:  Recent Labs Lab 11/02/12 1146 11/02/12 1622 11/02/12 2204 11/03/12 0801 11/03/12 1202  GLUCAP 160* 140* 166* 124* 138*    Recent Results (from the past 240 hour(s))  CULTURE, BLOOD (ROUTINE X 2)     Status: None   Collection Time    10/28/12  2:10 AM      Result Value Range Status   Specimen Description BLOOD RIGHT ANTECUBITAL   Final   Special Requests BOTTLES DRAWN AEROBIC AND ANAEROBIC 5CC   Final   Culture  Setup Time     Final   Value: 10/28/2012 08:57     Performed at [facility]   Culture     Final   Value: STAPHYLOCOCCUS AUREUS     Note: RIFAMPIN AND GENTAMICIN SHOULD NOT BE USED AS SINGLE DRUGS FOR TREATMENT OF STAPH INFECTIONS.     Note: Gram Stain Report Called to,Read Back By and Verified With: LINDSAY HUDSON ON 10/28/2012 AT 10:06P BY Serafina Mitchell     Performed at [facility]   Report Status 10/30/2012 FINAL   Final  Organism ID, Bacteria STAPHYLOCOCCUS AUREUS   Final  CULTURE, BLOOD (ROUTINE X 2)     Status: None   Collection Time    10/28/12  2:15 AM      Result Value Range Status   Specimen Description BLOOD RIGHT ARM   Final   Special Requests BOTTLES DRAWN AEROBIC AND ANAEROBIC 3CC   Final   Culture  Setup Time     Final   Value: 10/28/2012 08:57     Performed at [facility]   Culture     Final   Value: STAPHYLOCOCCUS AUREUS     Note: SUSCEPTIBILITIES PERFORMED ON PREVIOUS CULTURE WITHIN THE LAST 5 DAYS.     Note: Gram Stain Report Called to,Read Back By and Verified With: LINDSAY HUDSON ON 10/28/2012 AT 10:06P BY WILEJ     Performed at [facility]   Report Status 10/30/2012 FINAL   Final  GRAM STAIN     Status: None   Collection Time    10/28/12 12:40 PM      Result Value Range Status   Specimen Description CSF   Final   Special Requests Normal   Final   Gram Stain     Final   Value: CYTOSPIN PREP.     NO ORGANISMS SEEN     NO WBC  SEEN     Gram Stain Report Called to,Read Back By and Verified With: DEUTSCH,J. AT 1702 ON 10.03.14 BY LOVE,T.   Report Status 10/28/2012 FINAL   Final  FUNGUS CULTURE W SMEAR     Status: None   Collection Time    10/28/12 12:40 PM      Result Value Range Status   Specimen Description CSF   Final   Special Requests NONE   Final   Fungal Smear     Final   Value: NO YEAST OR FUNGAL ELEMENTS SEEN     Performed at [facility]   Culture     Final   Value: CULTURE IN PROGRESS FOR FOUR WEEKS     Performed at [facility]   Report Status PENDING   Incomplete  STOOL CULTURE     Status: None   Collection Time    10/29/12  7:37 AM      Result Value Range Status   Specimen Description STOOL   Final   Special Requests NONE   Final   Culture     Final   Value: NO SALMONELLA, SHIGELLA, CAMPYLOBACTER, YERSINIA, OR E.COLI 0157:H7 ISOLATED     Performed at [facility]   Report Status 11/01/2012 FINAL   Final  CULTURE, BLOOD (ROUTINE X 2)     Status: None   Collection Time    10/29/12 12:00 PM      Result Value Range Status   Specimen Description BLOOD RIGHT ARM  3 ML IN [facility] BOTTLE   Final   Special Requests NONE   Final   Culture  Setup Time     Final   Value: 10/29/2012 19:06     Performed at [facility]   Culture     Final   Value:        BLOOD CULTURE RECEIVED NO GROWTH TO DATE CULTURE WILL BE HELD FOR 5 DAYS BEFORE ISSUING A FINAL NEGATIVE REPORT     Performed at [facility]   Report Status PENDING   Incomplete  CULTURE, BLOOD (ROUTINE X 2)     Status: None   Collection  Time    10/29/12 12:18 PM      Result Value Range Status   Specimen Description BLOOD RIGHT HAND  4 ML IN [facility] BOTTLE   Final   Special Requests NONE   Final   Culture  Setup Time     Final   Value: 10/29/2012 21:55     Performed at [facility]   Culture     Final   Value:        BLOOD CULTURE RECEIVED NO GROWTH TO DATE CULTURE WILL BE HELD FOR 5 DAYS BEFORE  ISSUING A FINAL NEGATIVE REPORT     Performed at [facility]   Report Status PENDING   Incomplete  URINE CULTURE     Status: None   Collection Time    10/30/12  9:39 AM      Result Value Range Status   Specimen Description URINE, CATHETERIZED   Final   Special Requests NONE   Final   Culture  Setup Time     Final   Value: 10/30/2012 21:11     Performed at [facility] Count     Final   Value: NO GROWTH     Performed at [facility]   Culture     Final   Value: NO GROWTH     Performed at [facility]   Report Status 11/01/2012 FINAL   Final     Studies: No results found.  Scheduled Meds: .  ceFAZolin (ANCEF) IV  2 g Intravenous Q8H  . chlorhexidine  60 mL Topical Once  . escitalopram  10 mg Oral QHS  . famotidine  20 mg Oral QHS  . finasteride  5 mg Oral q morning - 10a  . fluticasone  2 spray Each Nare Daily  . furosemide  20 mg Oral Daily  . gentamicin irrigation  80 mg Irrigation On Call  . insulin aspart  0-9 Units Subcutaneous TID WC  . loratadine  10 mg Oral Daily  . mometasone-formoterol  2 puff Inhalation BID  . oxybutynin  10 mg Oral q morning - 10a  . pantoprazole  40 mg Oral Daily  . potassium chloride  20 mEq Oral Daily  . sodium chloride  3 mL Intravenous Q12H  . traZODone  50 mg Oral QHS   Continuous Infusions: . sodium chloride      Principal Problem:   Bacteremia due to Staphylococcus aureus Active Problems:   Pacemaker infection   LYMPHOMA NEC, MLIG, INGUINAL/LOWER LIMB   Essential hypertension, benign   GERD   DM (diabetes mellitus), secondary uncontrolled   Weakness   Abdominal pain   Acute encephalopathy   Other pancytopenia   Hyponatremia   Sepsis    Time spent: > 35 mins    [facility]  [facility] Pager [phone]. If 7PM-7AM, please contact night-coverage at [email or web address], password [facility] 11/03/2012, 12:18 PM  LOS: 7 days

## 2012-11-04 ENCOUNTER — Inpatient Hospital Stay (HOSPITAL_COMMUNITY): Payer: Medicare Other | Admitting: Certified Registered Nurse Anesthetist

## 2012-11-04 ENCOUNTER — Encounter (HOSPITAL_COMMUNITY): Payer: Medicare Other | Admitting: Certified Registered Nurse Anesthetist

## 2012-11-04 ENCOUNTER — Encounter (HOSPITAL_COMMUNITY): Payer: Self-pay | Admitting: Certified Registered Nurse Anesthetist

## 2012-11-04 ENCOUNTER — Encounter (HOSPITAL_COMMUNITY)
Admission: EM | Disposition: A | Discharge status: Home Health | Payer: Self-pay | Source: Home / Self Care | Attending: Internal Medicine

## 2012-11-04 DIAGNOSIS — T827XXA Infection and inflammatory reaction due to other cardiac and vascular devices, implants and grafts, initial encounter: Secondary | ICD-10-CM

## 2012-11-04 DIAGNOSIS — D649 Anemia, unspecified: Secondary | ICD-10-CM

## 2012-11-04 HISTORY — PX: PACEMAKER LEAD REMOVAL: SHX5064

## 2012-11-04 HISTORY — PX: TEMPORARY PACEMAKER INSERTION: SHX5471

## 2012-11-04 LAB — CBC WITH DIFFERENTIAL/PLATELET
Basophils Absolute: 0 10*3/uL (ref 0.0–0.1)
Basophils Relative: 1 % (ref 0–1)
Eosinophils Relative: 1 % (ref 0–5)
HCT: 24.3 % — ABNORMAL LOW (ref 39.0–52.0)
Hemoglobin: 8.4 g/dL — ABNORMAL LOW (ref 13.0–17.0)
Lymphocytes Relative: 41 % (ref 12–46)
Lymphs Abs: 0.7 10*3/uL (ref 0.7–4.0)
MCH: 34.9 pg — ABNORMAL HIGH (ref 26.0–34.0)
MCV: 100.8 fL — ABNORMAL HIGH (ref 78.0–100.0)
Monocytes Absolute: 0 10*3/uL — ABNORMAL LOW (ref 0.1–1.0)
Neutro Abs: 0.9 10*3/uL — ABNORMAL LOW (ref 1.7–7.7)
Neutrophils Relative %: 56 % (ref 43–77)
Platelets: 84 10*3/uL — ABNORMAL LOW (ref 150–400)
RBC: 2.41 MIL/uL — ABNORMAL LOW (ref 4.22–5.81)
RDW: 19.5 % — ABNORMAL HIGH (ref 11.5–15.5)
WBC: 1.6 10*3/uL — ABNORMAL LOW (ref 4.0–10.5)

## 2012-11-04 LAB — COMPREHENSIVE METABOLIC PANEL
Albumin: 3.2 g/dL — ABNORMAL LOW (ref 3.5–5.2)
Alkaline Phosphatase: 95 U/L (ref 39–117)
BUN: 11 mg/dL (ref 6–23)
CO2: 29 mEq/L (ref 19–32)
Calcium: 9.1 mg/dL (ref 8.4–10.5)
Chloride: 95 mEq/L — ABNORMAL LOW (ref 96–112)
Creatinine, Ser: 0.72 mg/dL (ref 0.50–1.35)
GFR calc non Af Amer: 87 mL/min — ABNORMAL LOW (ref >90)
Potassium: 3.6 mEq/L (ref 3.5–5.1)
Total Bilirubin: 0.8 mg/dL (ref 0.3–1.2)

## 2012-11-04 LAB — CULTURE, BLOOD (ROUTINE X 2)
Culture: NO GROWTH
Culture: NO GROWTH

## 2012-11-04 LAB — PATHOLOGIST SMEAR REVIEW

## 2012-11-04 LAB — GLUCOSE, CAPILLARY
Glucose-Capillary: 116 mg/dL — ABNORMAL HIGH (ref 70–99)
Glucose-Capillary: 125 mg/dL — ABNORMAL HIGH (ref 70–99)

## 2012-11-04 LAB — PROTIME-INR: Prothrombin Time: 14.9 seconds (ref 11.6–15.2)

## 2012-11-04 SURGERY — REMOVAL, ELECTRODE LEAD, CARDIAC PACEMAKER, WITHOUT REPLACEMENT
Anesthesia: General | Site: Chest | Laterality: Left | Wound class: Dirty or Infected

## 2012-11-04 MED ORDER — CEFAZOLIN SODIUM 1-5 GM-% IV SOLN
INTRAVENOUS | Status: AC
  Filled 2012-11-04: qty 50

## 2012-11-04 MED ORDER — SODIUM CHLORIDE 0.9 % IR SOLN
Freq: Once | Status: DC
  Filled 2012-11-04: qty 2

## 2012-11-04 MED ORDER — ONDANSETRON HCL 4 MG/2ML IJ SOLN: 4.0000 mg | Freq: Four times a day (QID) | INTRAMUSCULAR | Status: DC | PRN

## 2012-11-04 MED ORDER — SODIUM CHLORIDE 0.9 % IV SOLN
INTRAVENOUS | Status: DC | PRN
  Administered 2012-11-04: 16:00:00 via INTRAVENOUS

## 2012-11-04 MED ORDER — NEOSTIGMINE METHYLSULFATE 1 MG/ML IJ SOLN
INTRAMUSCULAR | Status: DC | PRN
  Administered 2012-11-04: 3 mg via INTRAVENOUS

## 2012-11-04 MED ORDER — PROMETHAZINE HCL 25 MG/ML IJ SOLN: 6.2500 mg | INTRAMUSCULAR | Status: DC | PRN

## 2012-11-04 MED ORDER — CEFAZOLIN SODIUM-DEXTROSE 2-3 GM-% IV SOLR
2.0000 g | Freq: Three times a day (TID) | INTRAVENOUS | Status: DC
  Administered 2012-11-04 – 2012-11-08 (×11): 2 g via INTRAVENOUS
  Filled 2012-11-04 (×16): qty 50

## 2012-11-04 MED ORDER — HEPARIN SODIUM (PORCINE) 1000 UNIT/ML IJ SOLN
INTRAMUSCULAR | Status: AC
  Filled 2012-11-04: qty 1

## 2012-11-04 MED ORDER — FENTANYL CITRATE 0.05 MG/ML IJ SOLN: 25.0000 ug | INTRAMUSCULAR | Status: DC | PRN

## 2012-11-04 MED ORDER — PHENYLEPHRINE HCL 10 MG/ML IJ SOLN
10.0000 mg | INTRAVENOUS | Status: DC | PRN
  Administered 2012-11-04: 10 ug/min via INTRAVENOUS

## 2012-11-04 MED ORDER — LACTATED RINGERS IV SOLN
INTRAVENOUS | Status: DC | PRN
  Administered 2012-11-04 (×2): via INTRAVENOUS

## 2012-11-04 MED ORDER — OXYCODONE HCL 5 MG PO TABS: 5.0000 mg | ORAL_TABLET | Freq: Once | ORAL | Status: DC | PRN

## 2012-11-04 MED ORDER — ARTIFICIAL TEARS OP OINT
TOPICAL_OINTMENT | OPHTHALMIC | Status: DC | PRN
  Administered 2012-11-04: 1 via OPHTHALMIC

## 2012-11-04 MED ORDER — ONDANSETRON HCL 4 MG/2ML IJ SOLN
INTRAMUSCULAR | Status: DC | PRN
  Administered 2012-11-04: 4 mg via INTRAMUSCULAR

## 2012-11-04 MED ORDER — OXYCODONE HCL 5 MG/5ML PO SOLN: 5.0000 mg | Freq: Once | ORAL | Status: DC | PRN

## 2012-11-04 MED ORDER — ACETAMINOPHEN 325 MG PO TABS
325.0000 mg | ORAL_TABLET | ORAL | Status: DC | PRN
  Administered 2012-11-05 – 2012-11-06 (×2): 650 mg via ORAL
  Filled 2012-11-04 (×2): qty 2

## 2012-11-04 MED ORDER — FENTANYL CITRATE 0.05 MG/ML IJ SOLN
INTRAMUSCULAR | Status: DC | PRN
  Administered 2012-11-04: 150 ug via INTRAVENOUS
  Administered 2012-11-04: 50 ug via INTRAVENOUS

## 2012-11-04 MED ORDER — SODIUM CHLORIDE 0.9 % IR SOLN
Status: DC | PRN
  Administered 2012-11-04: 17:00:00

## 2012-11-04 MED ORDER — ROCURONIUM BROMIDE 100 MG/10ML IV SOLN
INTRAVENOUS | Status: DC | PRN
  Administered 2012-11-04: 50 mg via INTRAVENOUS

## 2012-11-04 MED ORDER — CEFAZOLIN SODIUM 1-5 GM-% IV SOLN: 1.0000 g | Freq: Three times a day (TID) | INTRAVENOUS | Status: DC

## 2012-11-04 MED ORDER — GENTAMICIN SULFATE 40 MG/ML IJ SOLN
INTRAMUSCULAR | Status: DC | PRN
  Administered 2012-11-04: 17:00:00

## 2012-11-04 MED ORDER — GLYCOPYRROLATE 0.2 MG/ML IJ SOLN
INTRAMUSCULAR | Status: DC | PRN
  Administered 2012-11-04: .4 mg via INTRAVENOUS

## 2012-11-04 MED ORDER — LIDOCAINE HCL (PF) 1 % IJ SOLN
INTRAMUSCULAR | Status: AC
  Filled 2012-11-04: qty 30

## 2012-11-04 MED ORDER — PROPOFOL 10 MG/ML IV BOLUS
INTRAVENOUS | Status: DC | PRN
  Administered 2012-11-04: 100 mg via INTRAVENOUS

## 2012-11-04 MED ORDER — HEPARIN SODIUM (PORCINE) 5000 UNIT/ML IJ SOLN
5000.0000 [IU] | Freq: Three times a day (TID) | INTRAMUSCULAR | Status: DC
  Administered 2012-11-04 – 2012-11-08 (×11): 5000 [IU] via SUBCUTANEOUS
  Filled 2012-11-04 (×14): qty 1

## 2012-11-04 MED ORDER — MEPERIDINE HCL 25 MG/ML IJ SOLN: 6.2500 mg | INTRAMUSCULAR | Status: DC | PRN

## 2012-11-04 MED ORDER — MIDAZOLAM HCL 2 MG/2ML IJ SOLN: 0.5000 mg | Freq: Once | INTRAMUSCULAR | Status: DC | PRN

## 2012-11-04 MED ORDER — MIDAZOLAM HCL 5 MG/5ML IJ SOLN
INTRAMUSCULAR | Status: DC | PRN
  Administered 2012-11-04: 1 mg via INTRAVENOUS

## 2012-11-04 SURGICAL SUPPLY — 34 items
BAG BANDED W/RUBBER/TAPE 36X54 (MISCELLANEOUS) ×2 IMPLANT
BLADE STERNUM SYSTEM 6 (BLADE) ×4 IMPLANT
BNDG COHESIVE 4X5 WHT NS (GAUZE/BANDAGES/DRESSINGS) IMPLANT
CANISTER SUCTION 2500CC (MISCELLANEOUS) ×2 IMPLANT
CLOTH BEACON ORANGE TIMEOUT ST (SAFETY) ×2 IMPLANT
CLSR STERI-STRIP ANTIMIC 1/2X4 (GAUZE/BANDAGES/DRESSINGS) ×2 IMPLANT
COVER MAYO STAND STRL (DRAPES) ×2 IMPLANT
COVER TABLE BACK 60X90 (DRAPES) ×2 IMPLANT
DRAPE CARDIOVASCULAR INCISE (DRAPES) ×1
DRAPE SRG 135X102X78XABS (DRAPES) ×1 IMPLANT
DRSG OPSITE 6X11 MED (GAUZE/BANDAGES/DRESSINGS) IMPLANT
DRSG TEGADERM 4X4.75 (GAUZE/BANDAGES/DRESSINGS) ×2 IMPLANT
ELECT REM PT RETURN 9FT ADLT (ELECTROSURGICAL) ×4
ELECTRODE REM PT RTRN 9FT ADLT (ELECTROSURGICAL) ×2 IMPLANT
GAUZE SPONGE 4X4 16PLY XRAY LF (GAUZE/BANDAGES/DRESSINGS) IMPLANT
GLOVE BIOGEL PI IND STRL 7.5 (GLOVE) ×1 IMPLANT
GLOVE BIOGEL PI INDICATOR 7.5 (GLOVE) ×1
GLOVE ECLIPSE 8.0 STRL XLNG CF (GLOVE) ×2 IMPLANT
GOWN PREVENTION PLUS XLARGE (GOWN DISPOSABLE) ×2 IMPLANT
GOWN STRL NON-REIN LRG LVL3 (GOWN DISPOSABLE) IMPLANT
KIT ROOM TURNOVER OR (KITS) ×2 IMPLANT
PAD ARMBOARD 7.5X6 YLW CONV (MISCELLANEOUS) ×4 IMPLANT
PAD ELECT DEFIB RADIOL ZOLL (MISCELLANEOUS) ×2 IMPLANT
SPONGE GAUZE 4X4 12PLY (GAUZE/BANDAGES/DRESSINGS) IMPLANT
SUT PROLENE 2 0 SH DA (SUTURE) IMPLANT
SUT SILK  1 MH (SUTURE) ×3
SUT SILK 1 MH (SUTURE) ×3 IMPLANT
SUT VIC AB 2-0 SH 27 (SUTURE) ×1
SUT VIC AB 2-0 SH 27XBRD (SUTURE) ×1 IMPLANT
TOWEL OR 17X24 6PK STRL BLUE (TOWEL DISPOSABLE) ×4 IMPLANT
TOWEL OR 17X26 10 PK STRL BLUE (TOWEL DISPOSABLE) ×4 IMPLANT
TRAY FOLEY IC TEMP SENS 14FR (CATHETERS) ×4 IMPLANT
TUBE CONNECTING 12X1/4 (SUCTIONS) IMPLANT
YANKAUER SUCT BULB TIP NO VENT (SUCTIONS) IMPLANT

## 2012-11-04 NOTE — Preoperative (Signed)
Beta Blockers   Reason not to administer Beta Blockers:Not Applicable 

## 2012-11-04 NOTE — Anesthesia Procedure Notes (Signed)
Procedure Name: Intubation Date/Time: 11/04/2012 3:35 PM Performed by: RAI, RIPUDEEP K Pre-anesthesia Checklist: Patient identified, Emergency Drugs available, Suction available, Patient being monitored and Timeout performed Patient Re-evaluated:Patient Re-evaluated prior to inductionOxygen Delivery Method: Circle system utilized Preoxygenation: Pre-oxygenation with 100% oxygen Intubation Type: IV induction Ventilation: Mask ventilation without difficulty and Oral airway inserted - appropriate to patient size Laryngoscope size: Glidescope #4. Grade View: Grade I Tube type: Oral Tube size: 7.5 mm Number of attempts: 1 Airway Equipment and Method: Rigid stylet and Video-laryngoscopy Placement Confirmation: ETT inserted through vocal cords under direct vision,  positive ETCO2 and breath sounds checked- equal and bilateral Secured at: 23 cm Tube secured with: Tape Dental Injury: Teeth and Oropharynx as per pre-operative assessment  Comments: Smooth IV induction by Dr Jean Rosenthal; Easy atraumatic intubation by Katrinka Blazing.

## 2012-11-04 NOTE — Progress Notes (Signed)
Patient ID: [name], male   DOB: 08-18-1932, 79 y.o.   MRN: [id number] Subjective:  No complaints today, awaiting lead extraction and PPM insertion.  Objective:  Vital Signs in the last 24 hours: Temp:  [97.9 F (36.6 C)-98.6 F (37 C)] 98.6 F (37 C) (10/10 0621) Pulse Rate:  [84-87] 84 (10/10 0621) Resp:  [17-18] 18 (10/10 0621) BP: (102-136)/(62-84) 129/62 mmHg (10/10 0621) SpO2:  [96 %-100 %] 96 % (10/10 0621)  Intake/Output from previous day: 10/09 0701 - 10/10 0700 In: 600 [P.O.:360; I.V.:90; IV Piggyback:150] Out: 2900 [Urine:2900] Intake/Output from this shift: Total I/O In: -  Out: 1100 [Urine:1100]  Physical Exam: stable appearing elderly man, NAD HEENT: Unremarkable Neck:  No JVD, no thyromegally Back:  No CVA tenderness Lungs:  Clear with no wheezes HEART:  Regular rate rhythm, no murmurs, no rubs, no clicks Abd:  Flat, positive bowel sounds, no organomegally, no rebound, no guarding Ext:  2 plus pulses, no edema, no cyanosis, no clubbing Skin:  No rashes no nodules Neuro:  CN II through XII intact, motor grossly intact  Lab Results:  Recent Labs  11/03/12 0410 11/04/12 0550  WBC 1.3* 1.6*  HGB 8.5* 8.4*  PLT 74* 84*    Recent Labs  11/03/12 0410 11/04/12 0550  NA 136 133*  K 3.4* 3.6  CL 97 95*  CO2 30 29  GLUCOSE 130* 122*  BUN 11 11  CREATININE 0.75 0.72   No results found for this basename: TROPONINI, CK, MB,  in the last 72 hours Hepatic Function Panel  Recent Labs  11/04/12 0550  PROT 6.4  ALBUMIN 3.2*  AST 24  ALT 23  ALKPHOS 95  BILITOT 0.8   No results found for this basename: CHOL,  in the last 72 hours No results found for this basename: PROTIME,  in the last 72 hours  Imaging: No results found.  Cardiac Studies: Tele - nsr with ventricular pacing Assessment/Plan:  1. Staph bacteremia\ 2. CHB, s/p PPM Rec: I have discussed again the indication for ppm system extraction with the patient and his family and  likely need for insertion of a temporary permanent PM. I have discussed the risks/benefits/goals and expectations of pacemaker removal with the patient and his family and they wish to proceed.  LOS: 8 days    Lewayne Bunting, M.D. 11/04/2012, 12:15 PM

## 2012-11-04 NOTE — Progress Notes (Signed)
Occupational Therapy Treatment Patient Details Name: [name] MRN: [id number] DOB: Sep 02, 1932 Today's Date: 11/04/2012 Time: 1031-1050 OT Time Calculation (min): 19 min  OT Assessment / Plan / Recommendation  History of present illness The patient is a 79 year old man with a history of symptomatic bradycardia and paroxysmal atrial fibrillation status post pacemaker insertion in 2010. He presented to the hospital with fevers, chills, and with subsequent found to have blood cultures, positive for methicillin sensitive staph aureus. He has been treated with intravenous antibiotics and his symptoms and fevers have improved. He has chronic abdominal pain. He denies heart failure symptoms. He denies syncope. The patient denies any recent infections   OT comments  Pt demos progress with functional goals and would continue to benefit from acute OT services to help restore PLOF to return home safely  Follow Up Recommendations  Home health OT;Supervision - Intermittent    Barriers to Discharge   None    Equipment Recommendations  None recommended by OT    Recommendations for Other Services    Frequency Min 2X/week   Progress towards OT Goals Progress towards OT goals: Progressing toward goals  Plan Discharge plan remains appropriate    Precautions / Restrictions Precautions Precautions: Fall Restrictions Weight Bearing Restrictions: No   Pertinent Vitals/Pain No c/o pain    ADL  Grooming: Min guard Lower Body Bathing: Simulated;Minimal assistance Toilet Transfer: Performed;Minimal assistance Toilet Transfer Equipment: Regular height toilet;Grab bars Equipment Used: Gait belt Transfers/Ambulation Related to ADLs: Pt states that he feels ligh headed when he stands ADL Comments: family members present    OT Diagnosis:    OT Problem List:   OT Treatment Interventions:     OT Goals(current goals can now be found in the care plan section)    Visit Information  Last OT  Received On: 11/04/12 History of Present Illness: The patient is a 79 year old man with a history of symptomatic bradycardia and paroxysmal atrial fibrillation status post pacemaker insertion in 2010. He presented to the hospital with fevers, chills, and with subsequent found to have blood cultures, positive for methicillin sensitive staph aureus. He has been treated with intravenous antibiotics and his symptoms and fevers have improved. He has chronic abdominal pain. He denies heart failure symptoms. He denies syncope. The patient denies any recent infections    Subjective Data      Prior Functioning       Cognition  Cognition Arousal/Alertness: Suspect due to medications;Awake/alert Behavior During Therapy: WFL for tasks assessed/performed Overall Cognitive Status: Within Functional Limits for tasks assessed    Mobility  Bed Mobility Bed Mobility: Not assessed Transfers Sit to Stand: 4: Min assist;From chair/3-in-1;From toilet;With upper extremity assist Stand to Sit: To chair/3-in-1;To toilet;4: Min guard;Without upper extremity assist    Exercises      Balance Balance Balance Assessed: Yes Dynamic Standing Balance Dynamic Standing - Balance Support: Right upper extremity supported;During functional activity Dynamic Standing - Level of Assistance: Other (comment);4: Min assist (min guard A)   End of Session OT - End of Session Equipment Utilized During Treatment: Gait belt Activity Tolerance: Patient tolerated treatment well Patient left: in chair;with call bell/phone within reach;with family/visitor present  GO     Galen Manila 11/04/2012, 12:31 PM

## 2012-11-04 NOTE — Progress Notes (Signed)
Orthopedic Tech Progress Note Patient Details:  [name] [birth year] [id number]  Ortho Devices Type of Ortho Device: Arm sling Ortho Device/Splint Location: LUE Ortho Device/Splint Interventions: Ordered;Application   Jennye Moccasin 11/04/2012, 7:58 PM

## 2012-11-04 NOTE — Anesthesia Postprocedure Evaluation (Signed)
Anesthesia Post Note  Patient: [name]  Procedure(s) Performed: Procedure(s) (LRB): PACEMAKER LEAD REMOVAL (Left) TEMPORARY PACEMAKER INSERTION (Left)  Anesthesia type: general  Patient location: PACU  Post pain: Pain level controlled  Post assessment: Patient's Cardiovascular Status Stable  Last Vitals:  Filed Vitals:   11/04/12 1817  BP:   Pulse:   Temp: 37.2 C  Resp:     Post vital signs: Reviewed and stable  Level of consciousness: sedated  Complications: No apparent anesthesia complications

## 2012-11-04 NOTE — CV Procedure (Signed)
DDD PM system extraction and insertion of a temporary permanent PM via the left subclavian vein without immediate complication. [id number].

## 2012-11-04 NOTE — Anesthesia Preprocedure Evaluation (Addendum)
Anesthesia Evaluation  Patient identified by MRN, date of birth, ID band Patient awake    Reviewed: Allergy & Precautions, H&P , NPO status , Patient's Chart, lab work & pertinent test results  History of Anesthesia Complications Negative for: history of anesthetic complications  Airway Mallampati: II TM Distance: >3 FB     Dental  (+) Dental Advisory Given   Pulmonary asthma , COPD COPD inhaler, former smoker (quit 1970's),  breath sounds clear to auscultation  Pulmonary exam normal       Cardiovascular hypertension, Pt. on medications + Peripheral Vascular Disease + dysrhythmias (INR 1.20) Atrial Fibrillation Rhythm:Regular Rate:Normal  10/14 ECHO: EF 55-60%, mild AI   Neuro/Psych negative neurological ROS     GI/Hepatic Neg liver ROS, GERD-  Medicated,  Endo/Other  diabetes (glu 116), Well Controlled, Type 2, Oral Hypoglycemic AgentsMorbid obesity  Renal/GU negative Renal ROS     Musculoskeletal   Abdominal (+) + obese,   Peds  Hematology  (+) Blood dyscrasia (Hb 8.4, plt 84K), anemia ,   Anesthesia Other Findings H/o lymphoma  Reproductive/Obstetrics                          Anesthesia Physical Anesthesia Plan  ASA: IV  Anesthesia Plan: General   Post-op Pain Management:    Induction: Intravenous  Airway Management Planned: Oral ETT  Additional Equipment: Arterial line  Intra-op Plan:   Post-operative Plan: Possible Post-op intubation/ventilation  Informed Consent: I have reviewed the patients History and Physical, chart, labs and discussed the procedure including the risks, benefits and alternatives for the proposed anesthesia with the patient or authorized representative who has indicated his/her understanding and acceptance.   Dental advisory given  Plan Discussed with: CRNA and Surgeon  Anesthesia Plan Comments: (Plan routine monitors, A line, GETA with possible post op  ventilation )        Anesthesia Quick Evaluation

## 2012-11-04 NOTE — Progress Notes (Signed)
Patient ID: [name]  male  WUJ:[id number]    DOB: Jul 31, 1932    DOA: 10/27/2012  PCP: Sandrea Hughs, MD  Assessment/Plan: Principal Problem: MSSA bacteremia/pacemaker infection/sepsis  - CT of the abdomen and pelvis was negative. TEE which was done on 10/31/2012 was negative for any vegetations.  -repeat culture x 2 negative 10/4.  - To remove pacemaker today, once pacemaker is removed, will need to repeat blood cultures. If negative, will need a PICC line for long-term IV antibiotics  -Continue with cefazolin, ID and cardiology following   Active problems  acute encephalopathy : Clinically improved   -  CT head was negative. Likely secondary to problem #1.  pancytopenia  - Relatively chronic in nature and secondary to myelodysplastic syndrome.  - Patient's white count is essentially at baseline.  Patient has been seen by oncology and feel patient's pancytopenia is secondary to myelodysplastic syndrome. No need for any further transfusion or growth factor support per oncology. Follow.   abdominal pain : Resolved   -  CT abdomen and pelvis negative. Tolerating current diet. Continue supportive care.    Low back pain  Patient states low back pain improving. Plain films negative. If continued pain, post pacemaker removal may consider MRI of the L-spine.   Chronic grade 2 diastolic dysfunction  -  TEE on 10/31/2012 showed EF of 55-60%, normal wall motion, no aortic or mitral valve vegetation  - Continue Lasix   follicular lymphoma grade 3 diagnosed 2007  In remission per oncology. Followup as outpatient.  DVT Prophylaxis:  Code Status:  Disposition: Not medically ready     Subjective: awaiting pacemaker removal today, no specific complaints, daughter at the bedside   Objective: Weight change:   Intake/Output Summary (Last 24 hours) at 11/04/12 1238 Last data filed at 11/04/12 0900  Gross per 24 hour  Intake    550 ml  Output   3600 ml  Net  -3050 ml   Blood  pressure 129/62, pulse 84, temperature 98.6 F (37 C), temperature source Oral, resp. rate 18, height 5\' 11"  (1.803 m), weight 114.352 kg (252 lb 1.6 oz), SpO2 96.00%.  Physical Exam: General: Alert and awake, oriented x3, not in any acute distress. CVS: S1-S2 clear, no murmur rubs or gallops Chest: clear to auscultation bilaterally, no wheezing, rales or rhonchi Abdomen: soft nontender, nondistended, normal bowel sounds  Extremities: no cyanosis, clubbing or edema noted bilaterally   Lab Results: Basic Metabolic Panel:  Recent Labs Lab 11/03/12 0410 11/04/12 0550  NA 136 133*  K 3.4* 3.6  CL 97 95*  CO2 30 29  GLUCOSE 130* 122*  BUN 11 11  CREATININE 0.75 0.72  CALCIUM 8.9 9.1   Liver Function Tests:  Recent Labs Lab 10/30/12 0723 11/04/12 0550  AST 27 24  ALT 21 23  ALKPHOS 39 95  BILITOT 0.8 0.8  PROT 6.1 6.4  ALBUMIN 3.1* 3.2*   No results found for this basename: LIPASE, AMYLASE,  in the last 168 hours No results found for this basename: AMMONIA,  in the last 168 hours CBC:  Recent Labs Lab 11/03/12 0410 11/04/12 0550  WBC 1.3* 1.6*  NEUTROABS 0.8* 0.9*  HGB 8.5* 8.4*  HCT 25.3* 24.3*  MCV 102.0* 100.8*  PLT 74* 84*   Cardiac Enzymes: No results found for this basename: CKTOTAL, CKMB, CKMBINDEX, TROPONINI,  in the last 168 hours BNP: No components found with this basename: POCBNP,  CBG:  Recent Labs Lab 11/03/12 0801 11/03/12 1202 11/03/12  1655 11/03/12 2133 11/04/12 0803  GLUCAP 124* 138* 113* 158* 116*     Micro Results: Recent Results (from the past 240 hour(s))  CULTURE, BLOOD (ROUTINE X 2)     Status: None   Collection Time    10/28/12  2:10 AM      Result Value Range Status   Specimen Description BLOOD RIGHT ANTECUBITAL   Final   Special Requests BOTTLES DRAWN AEROBIC AND ANAEROBIC 5CC   Final   Culture  Setup Time     Final   Value: 10/28/2012 08:57     Performed at [facility]   Culture     Final   Value:  STAPHYLOCOCCUS AUREUS     Note: RIFAMPIN AND GENTAMICIN SHOULD NOT BE USED AS SINGLE DRUGS FOR TREATMENT OF STAPH INFECTIONS.     Note: Gram Stain Report Called to,Read Back By and Verified With: LINDSAY HUDSON ON 10/28/2012 AT 10:06P BY WILEJ     Performed at [facility]   Report Status 10/30/2012 FINAL   Final   Organism ID, Bacteria STAPHYLOCOCCUS AUREUS   Final  CULTURE, BLOOD (ROUTINE X 2)     Status: None   Collection Time    10/28/12  2:15 AM      Result Value Range Status   Specimen Description BLOOD RIGHT ARM   Final   Special Requests BOTTLES DRAWN AEROBIC AND ANAEROBIC 3CC   Final   Culture  Setup Time     Final   Value: 10/28/2012 08:57     Performed at [facility]   Culture     Final   Value: STAPHYLOCOCCUS AUREUS     Note: SUSCEPTIBILITIES PERFORMED ON PREVIOUS CULTURE WITHIN THE LAST 5 DAYS.     Note: Gram Stain Report Called to,Read Back By and Verified With: LINDSAY HUDSON ON 10/28/2012 AT 10:06P BY WILEJ     Performed at [facility]   Report Status 10/30/2012 FINAL   Final  GRAM STAIN     Status: None   Collection Time    10/28/12 12:40 PM      Result Value Range Status   Specimen Description CSF   Final   Special Requests Normal   Final   Gram Stain     Final   Value: CYTOSPIN PREP.     NO ORGANISMS SEEN     NO WBC SEEN     Gram Stain Report Called to,Read Back By and Verified With: DEUTSCH,J. AT 1702 ON 10.03.14 BY LOVE,T.   Report Status 10/28/2012 FINAL   Final  FUNGUS CULTURE W SMEAR     Status: None   Collection Time    10/28/12 12:40 PM      Result Value Range Status   Specimen Description CSF   Final   Special Requests NONE   Final   Fungal Smear     Final   Value: NO YEAST OR FUNGAL ELEMENTS SEEN     Performed at [facility]   Culture     Final   Value: CULTURE IN PROGRESS FOR FOUR WEEKS     Performed at [facility]   Report Status PENDING   Incomplete  STOOL CULTURE     Status: None   Collection Time     10/29/12  7:37 AM      Result Value Range Status   Specimen Description STOOL   Final   Special Requests NONE   Final   Culture  Final   Value: NO SALMONELLA, SHIGELLA, CAMPYLOBACTER, YERSINIA, OR E.COLI 0157:H7 ISOLATED     Performed at [facility]   Report Status 11/01/2012 FINAL   Final  CULTURE, BLOOD (ROUTINE X 2)     Status: None   Collection Time    10/29/12 12:00 PM      Result Value Range Status   Specimen Description BLOOD RIGHT ARM  3 ML IN [facility] BOTTLE   Final   Special Requests NONE   Final   Culture  Setup Time     Final   Value: 10/29/2012 19:06     Performed at [facility]   Culture     Final   Value: NO GROWTH 5 DAYS     Performed at [facility]   Report Status 11/04/2012 FINAL   Final  CULTURE, BLOOD (ROUTINE X 2)     Status: None   Collection Time    10/29/12 12:18 PM      Result Value Range Status   Specimen Description BLOOD RIGHT HAND  4 ML IN [facility] BOTTLE   Final   Special Requests NONE   Final   Culture  Setup Time     Final   Value: 10/29/2012 21:55     Performed at [facility]   Culture     Final   Value: NO GROWTH 5 DAYS     Performed at [facility]   Report Status 11/04/2012 FINAL   Final  URINE CULTURE     Status: None   Collection Time    10/30/12  9:39 AM      Result Value Range Status   Specimen Description URINE, CATHETERIZED   Final   Special Requests NONE   Final   Culture  Setup Time     Final   Value: 10/30/2012 21:11     Performed at [facility] Count     Final   Value: NO GROWTH     Performed at [facility]   Culture     Final   Value: NO GROWTH     Performed at [facility]   Report Status 11/01/2012 FINAL   Final    Studies/Results: Dg Chest 2 View  10/28/2012   CLINICAL DATA:  Shortness of Breath.  EXAM: CHEST  2 VIEW  COMPARISON:  09/30/2012  FINDINGS: Left pacer remains in place, unchanged. Prior median sternotomy and CABG. Cardiomegaly  with vascular congestion. No overt edema, effusions or acute bony abnormality.  IMPRESSION: Cardiomegaly, vascular congestion.   Electronically Signed   By: Charlett Nose M.D.   On: 10/28/2012 02:28   Dg Thoracic Spine 2 View  10/28/2012   CLINICAL DATA:  Back pain. No injury.  EXAM: THORACIC SPINE - 2 VIEW  COMPARISON:  Sagittal reconstructed image from a chest CT, 10/28/2012 and lateral chest radiograph, 07/03/2011.  FINDINGS: No fracture or spondylolisthesis.  Mild disk degenerative changes are noted along the mid and lower thoracic spine reflected by small anterior endplate osteophytes. This appearance is stable from the prior lateral chest radiograph. The bones are demineralized.  The surrounding soft tissues are unremarkable.  IMPRESSION: No fracture or spondylolisthesis.  Mild degenerative changes, stable from the prior lateral chest radiograph.   Electronically Signed   By: Amie Portland M.D.   On: 10/28/2012 08:28   Dg Lumbar Spine 2-3 Views  10/28/2012   CLINICAL DATA:  Back pain  EXAM: LUMBAR SPINE - 2-3  VIEW  COMPARISON:  None.  FINDINGS: No fracture or spondylolisthesis.  Moderate loss of disc height with endplate sclerosis and osteophytes is noted at L1-L2. Remaining lumbar disc spaces are well preserved.  There is a mild curvature of the upper lumbar spine, convex to the right.  The soft tissues are unremarkable.  IMPRESSION: No fracture or acute finding.  Disk degenerative changes at L1-L2.  Mild dextroscoliosis.   Electronically Signed   By: Amie Portland M.D.   On: 10/28/2012 08:25   Ct Head Wo Contrast  10/28/2012   *RADIOLOGY REPORT*  Clinical Data: Confusion  CT HEAD WITHOUT CONTRAST  Technique:  Contiguous axial images were obtained from the base of the skull through the vertex without contrast.  Comparison: Prior CT from 11/10/2008  Findings: There is no acute intracranial hemorrhage or infarct.  No mass or midline shift.  Mild hypo attenuation within the periventricular white matter  is similar to prior exam, and most consistent with chronic small to some ischemic. Calcification along the quadrigeminal plate is unchanged.  CSF containing spaces are stable with asymmetric enlargement of the left lateral ventricle. No extra-axial fluid collection.  Calvarium is intact.  Orbital soft tissues are within normal limits.  Scattered mucosal thickening is seen within the ethmoidal air cells bilaterally.  Mastoid air cells well pneumatized.  IMPRESSION: 1. Stable appearance of the the brain with no acute intra five.  2.  Ethmoidal sinus disease.   Original Report Authenticated By: Rise Mu, M.D.   Ct Angio Chest Pe W/cm &/or Wo Cm  10/28/2012   CLINICAL DATA:  Shortness of breath. Elevated D-dimer.  EXAM: CT ANGIOGRAPHY CHEST  CT ABDOMEN AND PELVIS WITH CONTRAST  TECHNIQUE: Multidetector CT imaging of the chest was performed using the standard protocol during bolus administration of intravenous contrast. Multiplanar CT image reconstructions including MIPs were obtained to evaluate the vascular anatomy. Multidetector CT imaging of the abdomen and pelvis was performed using the standard protocol during bolus administration of intravenous contrast.  CONTRAST:  OMNIPAQUE IOHEXOL 350 MG/ML SOLN  COMPARISON:  PET CT 07/02/2008  FINDINGS: CTA CHEST FINDINGS  No filling defects in the pulmonary arteries to suggest pulmonary emboli. Left-sided pacer is in place. Cardiomegaly. There are pericardial calcifications posteriorly. Scattered coronary artery calcifications. Prior CABG moderate-sized hiatal hernia. No mediastinal, hilar, or axillary adenopathy.  Areas of scarring/ fibrosis peripherally in the upper lobes. No confluent airspace opacities or effusions.  No acute bony abnormality.  CT ABDOMEN and PELVIS FINDINGS  Liver, spleen, pancreas, gallbladder, adrenals are unremarkable. Small bilateral renal cysts. No hydronephrosis.  Stomach and small bowel are decompressed. Right colonic wall  appears mildly thickened with low-density appearance. This can be seen with old inflammatory bowel disease. No active inflammation currently. No free fluid, free air or adenopathy. Aorta and iliac vessels are normal caliber.  Small bilateral inguinal hernias containing fat. Urinary bladder and prostate grossly unremarkable. No acute bony abnormality.  Review of the MIP images confirms the above findings.  IMPRESSION: CTA CHEST IMPRESSION  No evidence of pulmonary embolus.  Cardiomegaly.  CT ABDOMEN and PELVIS IMPRESSION  No acute findings in the abdomen or pelvis.  Fat density within the wall of the right colon may reflect prior inflammatory bowel disease. No active inflammation or acute findings currently.   Electronically Signed   By: Charlett Nose M.D.   On: 10/28/2012 05:05   Ct Abdomen Pelvis W Contrast  10/28/2012   CLINICAL DATA:  Shortness of breath. Elevated D-dimer.  EXAM: CT ANGIOGRAPHY CHEST  CT ABDOMEN AND PELVIS WITH CONTRAST  TECHNIQUE: Multidetector CT imaging of the chest was performed using the standard protocol during bolus administration of intravenous contrast. Multiplanar CT image reconstructions including MIPs were obtained to evaluate the vascular anatomy. Multidetector CT imaging of the abdomen and pelvis was performed using the standard protocol during bolus administration of intravenous contrast.  CONTRAST:  OMNIPAQUE IOHEXOL 350 MG/ML SOLN  COMPARISON:  PET CT 07/02/2008  FINDINGS: CTA CHEST FINDINGS  No filling defects in the pulmonary arteries to suggest pulmonary emboli. Left-sided pacer is in place. Cardiomegaly. There are pericardial calcifications posteriorly. Scattered coronary artery calcifications. Prior CABG moderate-sized hiatal hernia. No mediastinal, hilar, or axillary adenopathy.  Areas of scarring/ fibrosis peripherally in the upper lobes. No confluent airspace opacities or effusions.  No acute bony abnormality.  CT ABDOMEN and PELVIS FINDINGS  Liver, spleen,  pancreas, gallbladder, adrenals are unremarkable. Small bilateral renal cysts. No hydronephrosis.  Stomach and small bowel are decompressed. Right colonic wall appears mildly thickened with low-density appearance. This can be seen with old inflammatory bowel disease. No active inflammation currently. No free fluid, free air or adenopathy. Aorta and iliac vessels are normal caliber.  Small bilateral inguinal hernias containing fat. Urinary bladder and prostate grossly unremarkable. No acute bony abnormality.  Review of the MIP images confirms the above findings.  IMPRESSION: CTA CHEST IMPRESSION  No evidence of pulmonary embolus.  Cardiomegaly.  CT ABDOMEN and PELVIS IMPRESSION  No acute findings in the abdomen or pelvis.  Fat density within the wall of the right colon may reflect prior inflammatory bowel disease. No active inflammation or acute findings currently.   Electronically Signed   By: Charlett Nose M.D.   On: 10/28/2012 05:05   Dg Fluoro Guide Ndl Plc/bx  10/28/2012   CLINICAL DATA:  Altered mental status.  EXAM: DIAGNOSTIC LUMBAR PUNCTURE UNDER FLUOROSCOPIC GUIDANCE  FLUOROSCOPY TIME:  0 min 42 seconds  PROCEDURE: Informed consent was obtained from the patient prior to the procedure, including potential complications of headache, allergy, and pain. With the patient prone, the lower back was prepped with Betadine. 1% Lidocaine was used for local anesthesia. Lumbar puncture was performed at the L3-4 level using a 20 gauge needle with return of clearCSF with an opening pressure of 29 cm water. 15ml of CSF were obtained for laboratory studies. The patient tolerated the procedure well and there were no apparent complications.  IMPRESSION: Successful diagnostic lumbar puncture under fluoroscopic guidance at the level of L3-4. No evidence of immediate complication.   Electronically Signed   By: Myles Rosenthal M.D.   On: 10/28/2012 16:35    Medications: Scheduled Meds: .  ceFAZolin (ANCEF) IV  2 g Intravenous  Q8H  . escitalopram  10 mg Oral QHS  . famotidine  20 mg Oral QHS  . finasteride  5 mg Oral q morning - 10a  . fluticasone  2 spray Each Nare Daily  . furosemide  20 mg Oral Daily  . insulin aspart  0-9 Units Subcutaneous TID WC  . loratadine  10 mg Oral Daily  . mometasone-formoterol  2 puff Inhalation BID  . oxybutynin  10 mg Oral q morning - 10a  . pantoprazole  40 mg Oral Daily  . potassium chloride  20 mEq Oral Daily  . sodium chloride  3 mL Intravenous Q12H  . traZODone  50 mg Oral QHS      LOS: 8 days   RAI,RIPUDEEP M.D. [facility] 11/04/2012,  12:38 PM Pager: [phone]  If 7PM-7AM, please contact night-coverage [email or web address] Password [email or web address]

## 2012-11-04 NOTE — Progress Notes (Signed)
[facility]  Date of Admission:  10/27/2012  Antibiotics: Antibiotics Given (last 72 hours)   Date/Time Action Medication Dose Rate   11/01/12 1749 Given   ceFAZolin (ANCEF) IVPB 2 g/50 mL premix 2 g 100 mL/hr   11/02/12 0200 Given   ceFAZolin (ANCEF) IVPB 2 g/50 mL premix 2 g 100 mL/hr   11/02/12 1152 Given   ceFAZolin (ANCEF) IVPB 2 g/50 mL premix 2 g 100 mL/hr   11/02/12 1937 Given   ceFAZolin (ANCEF) IVPB 2 g/50 mL premix 2 g 100 mL/hr   11/03/12 0226 Given   ceFAZolin (ANCEF) IVPB 2 g/50 mL premix 2 g 100 mL/hr   11/03/12 1112 Given   ceFAZolin (ANCEF) IVPB 2 g/50 mL premix 2 g 100 mL/hr   11/03/12 2017 Given   ceFAZolin (ANCEF) IVPB 2 g/50 mL premix 2 g 100 mL/hr   11/04/12 0405 Given  [Last dose given late.  Re-timed dose.]   ceFAZolin (ANCEF) IVPB 2 g/50 mL premix 2 g 100 mL/hr   11/04/12 9604 Given   ceFAZolin (ANCEF) IVPB 2 g/50 mL premix 2 g 100 mL/hr      Subjective: No complaints, no f/c  Objective: Temp:  [97.9 F (36.6 C)-98.6 F (37 C)] 98.6 F (37 C) (10/10 0621) Pulse Rate:  [84-87] 84 (10/10 0621) Resp:  [17-18] 18 (10/10 0621) BP: (102-136)/(62-84) 129/62 mmHg (10/10 0621) SpO2:  [96 %-100 %] 96 % (10/10 0621)  General: Awake, alert nad Skin: no rashes Lungs: CTAB Cor: RRR Abdomen: soft, nt, nd, +bs Ext: no edema  Lab Results Lab Results  Component Value Date   WBC 1.6* 11/04/2012   HGB 8.4* 11/04/2012   HCT 24.3* 11/04/2012   MCV 100.8* 11/04/2012   PLT 84* 11/04/2012    Lab Results  Component Value Date   CREATININE 0.72 11/04/2012   BUN 11 11/04/2012   NA 133* 11/04/2012   K 3.6 11/04/2012   CL 95* 11/04/2012   CO2 29 11/04/2012    Lab Results  Component Value Date   ALT 23 11/04/2012   AST 24 11/04/2012   ALKPHOS 95 11/04/2012   BILITOT 0.8 11/04/2012      Microbiology: Recent Results (from the past 240 hour(s))  CULTURE, BLOOD (ROUTINE X 2)     Status: None   Collection Time   10/28/12  2:10 AM      Result Value Range Status   Specimen Description BLOOD RIGHT ANTECUBITAL   Final   Special Requests BOTTLES DRAWN AEROBIC AND ANAEROBIC 5CC   Final   Culture  Setup Time     Final   Value: 10/28/2012 08:57     Performed at [facility]   Culture     Final   Value: STAPHYLOCOCCUS AUREUS     Note: RIFAMPIN AND GENTAMICIN SHOULD NOT BE USED AS SINGLE DRUGS FOR TREATMENT OF STAPH INFECTIONS.     Note: Gram Stain Report Called to,Read Back By and Verified With: LINDSAY HUDSON ON 10/28/2012 AT 10:06P BY WILEJ     Performed at [facility]   Report Status 10/30/2012 FINAL   Final   Organism ID, Bacteria STAPHYLOCOCCUS AUREUS   Final  CULTURE, BLOOD (ROUTINE X 2)     Status: None   Collection Time    10/28/12  2:15 AM      Result Value Range Status   Specimen Description BLOOD RIGHT ARM   Final   Special Requests BOTTLES DRAWN AEROBIC AND  ANAEROBIC 3CC   Final   Culture  Setup Time     Final   Value: 10/28/2012 08:57     Performed at [facility]   Culture     Final   Value: STAPHYLOCOCCUS AUREUS     Note: SUSCEPTIBILITIES PERFORMED ON PREVIOUS CULTURE WITHIN THE LAST 5 DAYS.     Note: Gram Stain Report Called to,Read Back By and Verified With: LINDSAY HUDSON ON 10/28/2012 AT 10:06P BY WILEJ     Performed at [facility]   Report Status 10/30/2012 FINAL   Final  GRAM STAIN     Status: None   Collection Time    10/28/12 12:40 PM      Result Value Range Status   Specimen Description CSF   Final   Special Requests Normal   Final   Gram Stain     Final   Value: CYTOSPIN PREP.     NO ORGANISMS SEEN     NO WBC SEEN     Gram Stain Report Called to,Read Back By and Verified With: DEUTSCH,J. AT 1702 ON 10.03.14 BY LOVE,T.   Report Status 10/28/2012 FINAL   Final  FUNGUS CULTURE W SMEAR     Status: None   Collection Time    10/28/12 12:40 PM      Result Value Range Status   Specimen Description CSF   Final   Special Requests NONE    Final   Fungal Smear     Final   Value: NO YEAST OR FUNGAL ELEMENTS SEEN     Performed at [facility]   Culture     Final   Value: CULTURE IN PROGRESS FOR FOUR WEEKS     Performed at [facility]   Report Status PENDING   Incomplete  STOOL CULTURE     Status: None   Collection Time    10/29/12  7:37 AM      Result Value Range Status   Specimen Description STOOL   Final   Special Requests NONE   Final   Culture     Final   Value: NO SALMONELLA, SHIGELLA, CAMPYLOBACTER, YERSINIA, OR E.COLI 0157:H7 ISOLATED     Performed at [facility]   Report Status 11/01/2012 FINAL   Final  CULTURE, BLOOD (ROUTINE X 2)     Status: None   Collection Time    10/29/12 12:00 PM      Result Value Range Status   Specimen Description BLOOD RIGHT ARM  3 ML IN [facility] BOTTLE   Final   Special Requests NONE   Final   Culture  Setup Time     Final   Value: 10/29/2012 19:06     Performed at [facility]   Culture     Final   Value: NO GROWTH 5 DAYS     Performed at [facility]   Report Status 11/04/2012 FINAL   Final  CULTURE, BLOOD (ROUTINE X 2)     Status: None   Collection Time    10/29/12 12:18 PM      Result Value Range Status   Specimen Description BLOOD RIGHT HAND  4 ML IN [facility] BOTTLE   Final   Special Requests NONE   Final   Culture  Setup Time     Final   Value: 10/29/2012 21:55     Performed at [facility]   Culture     Final   Value: NO GROWTH 5 DAYS  Performed at [facility]   Report Status 11/04/2012 FINAL   Final  URINE CULTURE     Status: None   Collection Time    10/30/12  9:39 AM      Result Value Range Status   Specimen Description URINE, CATHETERIZED   Final   Special Requests NONE   Final   Culture  Setup Time     Final   Value: 10/30/2012 21:11     Performed at [facility] Count     Final   Value: NO GROWTH     Performed at [facility]   Culture     Final   Value: NO GROWTH      Performed at [facility]   Report Status 11/01/2012 FINAL   Final    Studies/Results: No results found.  Assessment/Plan: 1)  MSSA pacemaker infection with bacteremia - repeat culture x 2 negative 10/4.  To remove pacemaker today.  -continue with cefazolin  Wyolene Weimann, Molly Maduro, MD [facility] [email or web address] [phone] pager   [phone] cell 11/04/2012, 10:26 AM

## 2012-11-04 NOTE — Transfer of Care (Signed)
Immediate Anesthesia Transfer of Care Note  Patient: [name]  Procedure(s) Performed: Procedure(s): PACEMAKER LEAD REMOVAL (Left) TEMPORARY PACEMAKER INSERTION (Left)  Patient Location: PACU  Anesthesia Type:General  Level of Consciousness: awake, oriented, patient cooperative and lethargic  Airway & Oxygen Therapy: Patient Spontanous Breathing and Patient connected to face mask oxygen  Post-op Assessment: Report given to PACU RN, Post -op Vital signs reviewed and stable and Patient moving all extremities  Post vital signs: Reviewed and stable  Complications: No apparent anesthesia complications

## 2012-11-04 NOTE — Progress Notes (Signed)
PT Cancellation Note  Patient Details Name: [name] MRN: [id number] DOB: [birth year]   Cancelled Treatment:    Reason Eval/Treat Not Completed: Patient at procedure or test/unavailable.  Patient in OR for pacemaker removal this pm.  Will return tomorrow for PT session.   Vena Austria 11/04/2012, 4:41 PM Durenda Hurt. Renaldo Fiddler, [facility] Pager [phone]

## 2012-11-05 ENCOUNTER — Inpatient Hospital Stay (HOSPITAL_COMMUNITY): Payer: Medicare Other

## 2012-11-05 DIAGNOSIS — I441 Atrioventricular block, second degree: Secondary | ICD-10-CM

## 2012-11-05 DIAGNOSIS — M549 Dorsalgia, unspecified: Secondary | ICD-10-CM

## 2012-11-05 LAB — GLUCOSE, CAPILLARY
Glucose-Capillary: 128 mg/dL — ABNORMAL HIGH (ref 70–99)
Glucose-Capillary: 164 mg/dL — ABNORMAL HIGH (ref 70–99)

## 2012-11-05 NOTE — Progress Notes (Signed)
Patient ID: [name]  male  WUJ:[id number]    DOB: 1932-05-12    DOA: 10/27/2012  PCP: Sandrea Hughs, MD  Assessment/Plan: Principal Problem: MSSA bacteremia/pacemaker infection/sepsis  - CT of the abdomen and pelvis was negative. TEE which was done on 10/31/2012 was negative for any vegetations.  - pacemaker removed on 10/10, will repeat bood cultures today. - If negative x 48hrs, will place PICC line on Monday for long-term IV antibiotics  -Continue with cefazolin, ID and cardiology following   Active problems  acute encephalopathy : Clinically improved   -  CT head was negative. Likely secondary to problem #1.  pancytopenia - Relatively chronic in nature and secondary to myelodysplastic syndrome.  - Patient's white count is essentially at baseline.  Patient has been seen by oncology and feel patient's pancytopenia is secondary to myelodysplastic syndrome. No need for any further transfusion or growth factor support per oncology.  abdominal pain : Resolved   -  CT abdomen and pelvis negative. Tolerating current diet. Continue supportive care.    Low back pain  Patient states low back pain improving. Plain films negative. If continued pain, post pacemaker removal may consider MRI of the L-spine.   Chronic grade 2 diastolic dysfunction  -  TEE on 10/31/2012 showed EF of 55-60%, normal wall motion, no aortic or mitral valve vegetation  - Continue Lasix   follicular lymphoma grade 3 diagnosed 2007  In remission per oncology. Followup as outpatient.  DVT Prophylaxis:  Code Status:  Disposition: Not medically ready     Subjective: No complaints, dressing on the pacemaker site clean  Objective: Weight change:   Intake/Output Summary (Last 24 hours) at 11/05/12 0828 Last data filed at 11/05/12 0707  Gross per 24 hour  Intake   2775 ml  Output   4490 ml  Net  -1715 ml   Blood pressure 101/74, pulse 71, temperature 98.4 F (36.9 C), temperature source Oral, resp.  rate 22, height 5\' 11"  (1.803 m), weight 114.352 kg (252 lb 1.6 oz), SpO2 98.00%.  Physical Exam: General: Alert and awake, oriented x3, not in any acute distress. CVS: S1-S2 clear, no murmur rubs or gallops Chest: CTAB Abdomen: soft NT, ND, NBS  Extremities: no c/c/e bilaterally   Lab Results: Basic Metabolic Panel:  Recent Labs Lab 11/03/12 0410 11/04/12 0550  NA 136 133*  K 3.4* 3.6  CL 97 95*  CO2 30 29  GLUCOSE 130* 122*  BUN 11 11  CREATININE 0.75 0.72  CALCIUM 8.9 9.1   Liver Function Tests:  Recent Labs Lab 10/30/12 0723 11/04/12 0550  AST 27 24  ALT 21 23  ALKPHOS 39 95  BILITOT 0.8 0.8  PROT 6.1 6.4  ALBUMIN 3.1* 3.2*   No results found for this basename: LIPASE, AMYLASE,  in the last 168 hours No results found for this basename: AMMONIA,  in the last 168 hours CBC:  Recent Labs Lab 11/03/12 0410 11/04/12 0550  WBC 1.3* 1.6*  NEUTROABS 0.8* 0.9*  HGB 8.5* 8.4*  HCT 25.3* 24.3*  MCV 102.0* 100.8*  PLT 74* 84*   Cardiac Enzymes: No results found for this basename: CKTOTAL, CKMB, CKMBINDEX, TROPONINI,  in the last 168 hours BNP: No components found with this basename: POCBNP,  CBG:  Recent Labs Lab 11/03/12 2133 11/04/12 0803 11/04/12 1123 11/04/12 1730 11/05/12 0818  GLUCAP 158* 116* 125* 147* 128*     Micro Results: Recent Results (from the past 240 hour(s))  CULTURE, BLOOD (ROUTINE X  2)     Status: None   Collection Time    10/28/12  2:10 AM      Result Value Range Status   Specimen Description BLOOD RIGHT ANTECUBITAL   Final   Special Requests BOTTLES DRAWN AEROBIC AND ANAEROBIC 5CC   Final   Culture  Setup Time     Final   Value: 10/28/2012 08:57     Performed at [facility]   Culture     Final   Value: STAPHYLOCOCCUS AUREUS     Note: RIFAMPIN AND GENTAMICIN SHOULD NOT BE USED AS SINGLE DRUGS FOR TREATMENT OF STAPH INFECTIONS.     Note: Gram Stain Report Called to,Read Back By and Verified With: LINDSAY HUDSON  ON 10/28/2012 AT 10:06P BY WILEJ     Performed at [facility]   Report Status 10/30/2012 FINAL   Final   Organism ID, Bacteria STAPHYLOCOCCUS AUREUS   Final  CULTURE, BLOOD (ROUTINE X 2)     Status: None   Collection Time    10/28/12  2:15 AM      Result Value Range Status   Specimen Description BLOOD RIGHT ARM   Final   Special Requests BOTTLES DRAWN AEROBIC AND ANAEROBIC 3CC   Final   Culture  Setup Time     Final   Value: 10/28/2012 08:57     Performed at [facility]   Culture     Final   Value: STAPHYLOCOCCUS AUREUS     Note: SUSCEPTIBILITIES PERFORMED ON PREVIOUS CULTURE WITHIN THE LAST 5 DAYS.     Note: Gram Stain Report Called to,Read Back By and Verified With: LINDSAY HUDSON ON 10/28/2012 AT 10:06P BY WILEJ     Performed at [facility]   Report Status 10/30/2012 FINAL   Final  GRAM STAIN     Status: None   Collection Time    10/28/12 12:40 PM      Result Value Range Status   Specimen Description CSF   Final   Special Requests Normal   Final   Gram Stain     Final   Value: CYTOSPIN PREP.     NO ORGANISMS SEEN     NO WBC SEEN     Gram Stain Report Called to,Read Back By and Verified With: DEUTSCH,J. AT 1702 ON 10.03.14 BY LOVE,T.   Report Status 10/28/2012 FINAL   Final  FUNGUS CULTURE W SMEAR     Status: None   Collection Time    10/28/12 12:40 PM      Result Value Range Status   Specimen Description CSF   Final   Special Requests NONE   Final   Fungal Smear     Final   Value: NO YEAST OR FUNGAL ELEMENTS SEEN     Performed at [facility]   Culture     Final   Value: CULTURE IN PROGRESS FOR FOUR WEEKS     Performed at [facility]   Report Status PENDING   Incomplete  STOOL CULTURE     Status: None   Collection Time    10/29/12  7:37 AM      Result Value Range Status   Specimen Description STOOL   Final   Special Requests NONE   Final   Culture     Final   Value: NO SALMONELLA, SHIGELLA, CAMPYLOBACTER, YERSINIA, OR  E.COLI 0157:H7 ISOLATED     Performed at [facility]   Report Status 11/01/2012 FINAL  Final  CULTURE, BLOOD (ROUTINE X 2)     Status: None   Collection Time    10/29/12 12:00 PM      Result Value Range Status   Specimen Description BLOOD RIGHT ARM  3 ML IN [facility] BOTTLE   Final   Special Requests NONE   Final   Culture  Setup Time     Final   Value: 10/29/2012 19:06     Performed at [facility]   Culture     Final   Value: NO GROWTH 5 DAYS     Performed at [facility]   Report Status 11/04/2012 FINAL   Final  CULTURE, BLOOD (ROUTINE X 2)     Status: None   Collection Time    10/29/12 12:18 PM      Result Value Range Status   Specimen Description BLOOD RIGHT HAND  4 ML IN [facility] BOTTLE   Final   Special Requests NONE   Final   Culture  Setup Time     Final   Value: 10/29/2012 21:55     Performed at [facility]   Culture     Final   Value: NO GROWTH 5 DAYS     Performed at [facility]   Report Status 11/04/2012 FINAL   Final  URINE CULTURE     Status: None   Collection Time    10/30/12  9:39 AM      Result Value Range Status   Specimen Description URINE, CATHETERIZED   Final   Special Requests NONE   Final   Culture  Setup Time     Final   Value: 10/30/2012 21:11     Performed at [facility] Count     Final   Value: NO GROWTH     Performed at [facility]   Culture     Final   Value: NO GROWTH     Performed at [facility]   Report Status 11/01/2012 FINAL   Final    Studies/Results: Dg Chest 2 View  10/28/2012   CLINICAL DATA:  Shortness of Breath.  EXAM: CHEST  2 VIEW  COMPARISON:  09/30/2012  FINDINGS: Left pacer remains in place, unchanged. Prior median sternotomy and CABG. Cardiomegaly with vascular congestion. No overt edema, effusions or acute bony abnormality.  IMPRESSION: Cardiomegaly, vascular congestion.   Electronically Signed   By: Charlett Nose M.D.   On: 10/28/2012 02:28   Dg  Thoracic Spine 2 View  10/28/2012   CLINICAL DATA:  Back pain. No injury.  EXAM: THORACIC SPINE - 2 VIEW  COMPARISON:  Sagittal reconstructed image from a chest CT, 10/28/2012 and lateral chest radiograph, 07/03/2011.  FINDINGS: No fracture or spondylolisthesis.  Mild disk degenerative changes are noted along the mid and lower thoracic spine reflected by small anterior endplate osteophytes. This appearance is stable from the prior lateral chest radiograph. The bones are demineralized.  The surrounding soft tissues are unremarkable.  IMPRESSION: No fracture or spondylolisthesis.  Mild degenerative changes, stable from the prior lateral chest radiograph.   Electronically Signed   By: Amie Portland M.D.   On: 10/28/2012 08:28   Dg Lumbar Spine 2-3 Views  10/28/2012   CLINICAL DATA:  Back pain  EXAM: LUMBAR SPINE - 2-3 VIEW  COMPARISON:  None.  FINDINGS: No fracture or spondylolisthesis.  Moderate loss of disc height with endplate sclerosis and osteophytes is noted at L1-L2. Remaining lumbar disc spaces  are well preserved.  There is a mild curvature of the upper lumbar spine, convex to the right.  The soft tissues are unremarkable.  IMPRESSION: No fracture or acute finding.  Disk degenerative changes at L1-L2.  Mild dextroscoliosis.   Electronically Signed   By: Amie Portland M.D.   On: 10/28/2012 08:25   Ct Head Wo Contrast  10/28/2012   *RADIOLOGY REPORT*  Clinical Data: Confusion  CT HEAD WITHOUT CONTRAST  Technique:  Contiguous axial images were obtained from the base of the skull through the vertex without contrast.  Comparison: Prior CT from 11/10/2008  Findings: There is no acute intracranial hemorrhage or infarct.  No mass or midline shift.  Mild hypo attenuation within the periventricular white matter is similar to prior exam, and most consistent with chronic small to some ischemic. Calcification along the quadrigeminal plate is unchanged.  CSF containing spaces are stable with asymmetric enlargement of  the left lateral ventricle. No extra-axial fluid collection.  Calvarium is intact.  Orbital soft tissues are within normal limits.  Scattered mucosal thickening is seen within the ethmoidal air cells bilaterally.  Mastoid air cells well pneumatized.  IMPRESSION: 1. Stable appearance of the the brain with no acute intra five.  2.  Ethmoidal sinus disease.   Original Report Authenticated By: Rise Mu, M.D.   Ct Angio Chest Pe W/cm &/or Wo Cm  10/28/2012   CLINICAL DATA:  Shortness of breath. Elevated D-dimer.  EXAM: CT ANGIOGRAPHY CHEST  CT ABDOMEN AND PELVIS WITH CONTRAST  TECHNIQUE: Multidetector CT imaging of the chest was performed using the standard protocol during bolus administration of intravenous contrast. Multiplanar CT image reconstructions including MIPs were obtained to evaluate the vascular anatomy. Multidetector CT imaging of the abdomen and pelvis was performed using the standard protocol during bolus administration of intravenous contrast.  CONTRAST:  OMNIPAQUE IOHEXOL 350 MG/ML SOLN  COMPARISON:  PET CT 07/02/2008  FINDINGS: CTA CHEST FINDINGS  No filling defects in the pulmonary arteries to suggest pulmonary emboli. Left-sided pacer is in place. Cardiomegaly. There are pericardial calcifications posteriorly. Scattered coronary artery calcifications. Prior CABG moderate-sized hiatal hernia. No mediastinal, hilar, or axillary adenopathy.  Areas of scarring/ fibrosis peripherally in the upper lobes. No confluent airspace opacities or effusions.  No acute bony abnormality.  CT ABDOMEN and PELVIS FINDINGS  Liver, spleen, pancreas, gallbladder, adrenals are unremarkable. Small bilateral renal cysts. No hydronephrosis.  Stomach and small bowel are decompressed. Right colonic wall appears mildly thickened with low-density appearance. This can be seen with old inflammatory bowel disease. No active inflammation currently. No free fluid, free air or adenopathy. Aorta and iliac vessels are  normal caliber.  Small bilateral inguinal hernias containing fat. Urinary bladder and prostate grossly unremarkable. No acute bony abnormality.  Review of the MIP images confirms the above findings.  IMPRESSION: CTA CHEST IMPRESSION  No evidence of pulmonary embolus.  Cardiomegaly.  CT ABDOMEN and PELVIS IMPRESSION  No acute findings in the abdomen or pelvis.  Fat density within the wall of the right colon may reflect prior inflammatory bowel disease. No active inflammation or acute findings currently.   Electronically Signed   By: Charlett Nose M.D.   On: 10/28/2012 05:05   Ct Abdomen Pelvis W Contrast  10/28/2012   CLINICAL DATA:  Shortness of breath. Elevated D-dimer.  EXAM: CT ANGIOGRAPHY CHEST  CT ABDOMEN AND PELVIS WITH CONTRAST  TECHNIQUE: Multidetector CT imaging of the chest was performed using the standard protocol during bolus administration of intravenous  contrast. Multiplanar CT image reconstructions including MIPs were obtained to evaluate the vascular anatomy. Multidetector CT imaging of the abdomen and pelvis was performed using the standard protocol during bolus administration of intravenous contrast.  CONTRAST:  OMNIPAQUE IOHEXOL 350 MG/ML SOLN  COMPARISON:  PET CT 07/02/2008  FINDINGS: CTA CHEST FINDINGS  No filling defects in the pulmonary arteries to suggest pulmonary emboli. Left-sided pacer is in place. Cardiomegaly. There are pericardial calcifications posteriorly. Scattered coronary artery calcifications. Prior CABG moderate-sized hiatal hernia. No mediastinal, hilar, or axillary adenopathy.  Areas of scarring/ fibrosis peripherally in the upper lobes. No confluent airspace opacities or effusions.  No acute bony abnormality.  CT ABDOMEN and PELVIS FINDINGS  Liver, spleen, pancreas, gallbladder, adrenals are unremarkable. Small bilateral renal cysts. No hydronephrosis.  Stomach and small bowel are decompressed. Right colonic wall appears mildly thickened with low-density appearance.  This can be seen with old inflammatory bowel disease. No active inflammation currently. No free fluid, free air or adenopathy. Aorta and iliac vessels are normal caliber.  Small bilateral inguinal hernias containing fat. Urinary bladder and prostate grossly unremarkable. No acute bony abnormality.  Review of the MIP images confirms the above findings.  IMPRESSION: CTA CHEST IMPRESSION  No evidence of pulmonary embolus.  Cardiomegaly.  CT ABDOMEN and PELVIS IMPRESSION  No acute findings in the abdomen or pelvis.  Fat density within the wall of the right colon may reflect prior inflammatory bowel disease. No active inflammation or acute findings currently.   Electronically Signed   By: Charlett Nose M.D.   On: 10/28/2012 05:05   Dg Fluoro Guide Ndl Plc/bx  10/28/2012   CLINICAL DATA:  Altered mental status.  EXAM: DIAGNOSTIC LUMBAR PUNCTURE UNDER FLUOROSCOPIC GUIDANCE  FLUOROSCOPY TIME:  0 min 42 seconds  PROCEDURE: Informed consent was obtained from the patient prior to the procedure, including potential complications of headache, allergy, and pain. With the patient prone, the lower back was prepped with Betadine. 1% Lidocaine was used for local anesthesia. Lumbar puncture was performed at the L3-4 level using a 20 gauge needle with return of clearCSF with an opening pressure of 29 cm water. 15ml of CSF were obtained for laboratory studies. The patient tolerated the procedure well and there were no apparent complications.  IMPRESSION: Successful diagnostic lumbar puncture under fluoroscopic guidance at the level of L3-4. No evidence of immediate complication.   Electronically Signed   By: Myles Rosenthal M.D.   On: 10/28/2012 16:35    Medications: Scheduled Meds: .  ceFAZolin (ANCEF) IV  2 g Intravenous Q8H  . escitalopram  10 mg Oral QHS  . famotidine  20 mg Oral QHS  . finasteride  5 mg Oral q morning - 10a  . fluticasone  2 spray Each Nare Daily  . furosemide  20 mg Oral Daily  . gentamicin irrigation    Irrigation Once  . heparin subcutaneous  5,000 Units Subcutaneous Q8H  . insulin aspart  0-9 Units Subcutaneous TID WC  . loratadine  10 mg Oral Daily  . mometasone-formoterol  2 puff Inhalation BID  . oxybutynin  10 mg Oral q morning - 10a  . pantoprazole  40 mg Oral Daily  . potassium chloride  20 mEq Oral Daily  . sodium chloride  3 mL Intravenous Q12H  . traZODone  50 mg Oral QHS      LOS: 9 days   Giordan Fordham M.D. [facility] 11/05/2012, 8:28 AM Pager: [phone]  If 7PM-7AM, please contact night-coverage [email or web address] Password [email or web address]

## 2012-11-05 NOTE — Op Note (Signed)
NAMEMADDOXX, BURKITT NO.:  [id number]  MEDICAL RECORD NO.:  [id number]  LOCATION:  3W32C                        FACILITY:  [facility]  PHYSICIAN:  Doylene Canning. Ladona Ridgel, MD    DATE OF BIRTH:  1932/03/17  DATE OF PROCEDURE:  11/04/2012 DATE OF DISCHARGE:                              OPERATIVE REPORT   PROCEDURE PERFORMED:  Insertion of a temporary permanent transvenous pacemaker followed by extraction of a dual-chamber pacing system.  INDICATION:  Staph aureus bacteremia of unclear etiology with the assumption that the pacemaker is contaminated.  INTRODUCTION:  The patient is a 79 year old man who has a history of complete heart block and underwent permanent pacemaker insertion approximately 4 years ago.  He has no ventricular escape.  He presented to the hospital with bacteremia, fevers, and chills, and was found to have Staph aureus in his bloodstream which was methicillin sensitive. He defervesced nicely on antibiotics.  He had no obvious vegetation on a transesophageal echo.  Infectious Disease Service was consulted and recommended extraction of his pacing system and 6 weeks of antibiotics followed by re-insertion of a new system.  He is now referred for this procedure.  DESCRIPTION OF PROCEDURE:  After informed consent was obtained, the patient was taken to the operating room in a fasting state.  General endotracheal anesthesia was applied under the direction of Dr. Jean Rosenthal. The left internal jugular vein was then punctured, and a Medtronic active fixation pacing lead was advanced under fluoroscopic guidance into the right ventricle and actively fixed.  At this point, the attention was then turned to the pacemaker system.  A 5-cm incision was carried out over the old pacemaker generator, and electrocautery was utilized to dissect down to the pacemaker pocket.  The generator was removed with gentle traction.  The atrial and ventricular leads were disconnected from  the pacemaker, and electrocautery was utilized to free up the fibrous adhesions.  The atrial lead was first targeted for extraction.  A 52-cm stylet was advanced into the lead and the helix of the lead was retracted.  Gentle traction was placed on the lead and after a minute or so of gentle traction, the atrial lead was removed in total.  Next, attention was then turned to the ventricular lead.  In the same way, a 58-cm stylet was advanced into the ventricular lead and the helix was retracted back into the lead body.  Gentle traction was placed on the lead.  It initially was stuck.  After several minutes of gentle traction, however, the ventricular lead pulled free without any hemodynamic sequelae.  Hemostasis was obtained at the pocket site.  The pocket was irrigated with antibiotic irrigation, and the incision was closed with 2-0 Vicryl suture.  At this point, I was concerned about stability of the temporary permanent pacemaker in the left internal jugular vein, and we tried to transcutaneously pace the patient to see if we could reposition the lead while undergoing transcutaneous pacing. Unfortunately, we had no capture with transcutaneous pacing, as evidenced by no blood pressure noted when the temporary permanent pacemaker system was turned off and the transcutaneous system was turned on.  At this point, we re-punctured the left subclavian  vein and advanced a new temporary permanent active fixation Medtronic bipolar pacing lead under fluoroscopic guidance and into the right ventricle. The lead was placed more distally into the right ventricle towards the apical septum on the septal wall.  In this location, the lead was actively fixed.  The pacing impedance was stable.  The R waves were 12 and the threshold was less than a volt at 0.5 milliseconds.  At this point, the lead was secured to the skin with silk suture and the sewing sleeve was secured to the skin with silk suture.  The  temporary permanent pacemaker was then connected to the new temporary permanent pacing lead in the left subclavian venous system and secured to the skin with silk suture.  The previously implanted temporary permanent pacemaker which had been placed in the left internal jugular vein was extracted after the helix was withdrawn back into the body of the lead and pressure was held.  The patient was then returned to the recovery area in satisfactory condition.  COMPLICATIONS:  There were no immediate procedure complications.  RESULTS:  This demonstrate successful extraction of a Medtronic dual- chamber pacing system in a patient with complete heart block, and successful insertion of a temporary permanent transvenous pacing system without immediate procedure complications.     Doylene Canning. Ladona Ridgel, MD     GWT/MEDQ  D:  11/04/2012  T:  11/05/2012  Job:  [id number]  cc:   Hillis Range, MD

## 2012-11-05 NOTE — Progress Notes (Signed)
[facility]  Date of Admission:  10/27/2012  Antibiotics: Antibiotics Given (last 72 hours)   Date/Time Action Medication Dose Rate   11/02/12 1152 Given   ceFAZolin (ANCEF) IVPB 2 g/50 mL premix 2 g 100 mL/hr   11/02/12 1937 Given   ceFAZolin (ANCEF) IVPB 2 g/50 mL premix 2 g 100 mL/hr   11/03/12 0226 Given   ceFAZolin (ANCEF) IVPB 2 g/50 mL premix 2 g 100 mL/hr   11/03/12 1112 Given   ceFAZolin (ANCEF) IVPB 2 g/50 mL premix 2 g 100 mL/hr   11/03/12 2017 Given   ceFAZolin (ANCEF) IVPB 2 g/50 mL premix 2 g 100 mL/hr   11/04/12 0405 Given  [Last dose given late.  Re-timed dose.]   ceFAZolin (ANCEF) IVPB 2 g/50 mL premix 2 g 100 mL/hr   11/04/12 1610 Given   ceFAZolin (ANCEF) IVPB 2 g/50 mL premix 2 g 100 mL/hr   11/04/12 1540 Given   ceFAZolin (ANCEF) IVPB 2 g/50 mL premix 2 g    11/04/12 1630 Given  [exp 11/05/12]   gentamicin (GARAMYCIN) 80 mg in sodium chloride irrigation 0.9 % 500 mL irrigation     11/04/12 2210 Given   ceFAZolin (ANCEF) IVPB 2 g/50 mL premix 2 g 100 mL/hr   11/05/12 0707 Given   ceFAZolin (ANCEF) IVPB 2 g/50 mL premix 2 g 100 mL/hr      Subjective: No complaints, now pm removed  Objective: Temp:  [97.2 F (36.2 C)-99 F (37.2 C)] 98.4 F (36.9 C) (10/11 0518) Pulse Rate:  [59-86] 71 (10/11 0518) Resp:  [17-33] 22 (10/11 0518) BP: (97-134)/(51-81) 101/74 mmHg (10/11 0518) SpO2:  [92 %-100 %] 98 % (10/11 0518) Arterial Line BP: (92-148)/(54-85) 135/56 mmHg (10/10 1812)  General: Awake, alert nad Skin: no rashes, pm site wrapped Lungs: CTAB Cor: RRR Abdomen: soft, nt, nd, +bs Ext: no edema  Lab Results Lab Results  Component Value Date   WBC 1.6* 11/04/2012   HGB 8.4* 11/04/2012   HCT 24.3* 11/04/2012   MCV 100.8* 11/04/2012   PLT 84* 11/04/2012    Lab Results  Component Value Date   CREATININE 0.72 11/04/2012   BUN 11 11/04/2012   NA 133* 11/04/2012   K 3.6 11/04/2012   CL 95* 11/04/2012   CO2 29  11/04/2012    Lab Results  Component Value Date   ALT 23 11/04/2012   AST 24 11/04/2012   ALKPHOS 95 11/04/2012   BILITOT 0.8 11/04/2012      Microbiology: Recent Results (from the past 240 hour(s))  CULTURE, BLOOD (ROUTINE X 2)     Status: None   Collection Time    10/28/12  2:10 AM      Result Value Range Status   Specimen Description BLOOD RIGHT ANTECUBITAL   Final   Special Requests BOTTLES DRAWN AEROBIC AND ANAEROBIC 5CC   Final   Culture  Setup Time     Final   Value: 10/28/2012 08:57     Performed at [facility]   Culture     Final   Value: STAPHYLOCOCCUS AUREUS     Note: RIFAMPIN AND GENTAMICIN SHOULD NOT BE USED AS SINGLE DRUGS FOR TREATMENT OF STAPH INFECTIONS.     Note: Gram Stain Report Called to,Read Back By and Verified With: LINDSAY HUDSON ON 10/28/2012 AT 10:06P BY Serafina Mitchell     Performed at [facility]   Report Status 10/30/2012 FINAL   Final   Organism ID,  Bacteria STAPHYLOCOCCUS AUREUS   Final  CULTURE, BLOOD (ROUTINE X 2)     Status: None   Collection Time    10/28/12  2:15 AM      Result Value Range Status   Specimen Description BLOOD RIGHT ARM   Final   Special Requests BOTTLES DRAWN AEROBIC AND ANAEROBIC 3CC   Final   Culture  Setup Time     Final   Value: 10/28/2012 08:57     Performed at [facility]   Culture     Final   Value: STAPHYLOCOCCUS AUREUS     Note: SUSCEPTIBILITIES PERFORMED ON PREVIOUS CULTURE WITHIN THE LAST 5 DAYS.     Note: Gram Stain Report Called to,Read Back By and Verified With: LINDSAY HUDSON ON 10/28/2012 AT 10:06P BY WILEJ     Performed at [facility]   Report Status 10/30/2012 FINAL   Final  GRAM STAIN     Status: None   Collection Time    10/28/12 12:40 PM      Result Value Range Status   Specimen Description CSF   Final   Special Requests Normal   Final   Gram Stain     Final   Value: CYTOSPIN PREP.     NO ORGANISMS SEEN     NO WBC SEEN     Gram Stain Report Called to,Read Back By and  Verified With: DEUTSCH,J. AT 1702 ON 10.03.14 BY LOVE,T.   Report Status 10/28/2012 FINAL   Final  FUNGUS CULTURE W SMEAR     Status: None   Collection Time    10/28/12 12:40 PM      Result Value Range Status   Specimen Description CSF   Final   Special Requests NONE   Final   Fungal Smear     Final   Value: NO YEAST OR FUNGAL ELEMENTS SEEN     Performed at [facility]   Culture     Final   Value: CULTURE IN PROGRESS FOR FOUR WEEKS     Performed at [facility]   Report Status PENDING   Incomplete  STOOL CULTURE     Status: None   Collection Time    10/29/12  7:37 AM      Result Value Range Status   Specimen Description STOOL   Final   Special Requests NONE   Final   Culture     Final   Value: NO SALMONELLA, SHIGELLA, CAMPYLOBACTER, YERSINIA, OR E.COLI 0157:H7 ISOLATED     Performed at [facility]   Report Status 11/01/2012 FINAL   Final  CULTURE, BLOOD (ROUTINE X 2)     Status: None   Collection Time    10/29/12 12:00 PM      Result Value Range Status   Specimen Description BLOOD RIGHT ARM  3 ML IN [facility] BOTTLE   Final   Special Requests NONE   Final   Culture  Setup Time     Final   Value: 10/29/2012 19:06     Performed at [facility]   Culture     Final   Value: NO GROWTH 5 DAYS     Performed at [facility]   Report Status 11/04/2012 FINAL   Final  CULTURE, BLOOD (ROUTINE X 2)     Status: None   Collection Time    10/29/12 12:18 PM      Result Value Range Status   Specimen Description BLOOD RIGHT HAND  4 ML IN [facility] BOTTLE   Final   Special Requests NONE   Final   Culture  Setup Time     Final   Value: 10/29/2012 21:55     Performed at [facility]   Culture     Final   Value: NO GROWTH 5 DAYS     Performed at [facility]   Report Status 11/04/2012 FINAL   Final  URINE CULTURE     Status: None   Collection Time    10/30/12  9:39 AM      Result Value Range Status   Specimen Description URINE,  CATHETERIZED   Final   Special Requests NONE   Final   Culture  Setup Time     Final   Value: 10/30/2012 21:11     Performed at [facility] Count     Final   Value: NO GROWTH     Performed at [facility]   Culture     Final   Value: NO GROWTH     Performed at [facility]   Report Status 11/01/2012 FINAL   Final    Studies/Results: Dg Chest 2 View  11/05/2012   CLINICAL DATA:  Extraction of old pacer.  EXAM: CHEST  2 VIEW  COMPARISON:  10/28/2012  FINDINGS: There is a new pacemaker in the left chest. Patient now has a single cardiac lead that extends into the region of the right ventricle. There is no evidence for a pneumothorax. Again noted is enlargement of the cardiac silhouette. No significant airspace disease and no evidence for pleural effusions.  IMPRESSION: Placement of new cardiac pacemaker. No evidence for pneumothorax.  Cardiomegaly.   Electronically Signed   By: Richarda Overlie M.D.   On: 11/05/2012 07:46    Assessment/Plan: 1)  MSSA pacemaker infection with bacteremia - repeat blood culture remained negative, pacemaker out.   At this time, he does not have any further back pain and therefore would not be consistent with discitis or epidural abscess.   -continue with cefazolin 2 g q 8 hours for 6 weeks from today after removal of pm through 11/21 per home health protocol -weekly cbc, cmp to [facility] -we will arrange follow up in [facility] in about 2-3 weeks  I will follow up again on Monday   Staci Righter, MD [email or web address] [phone] pager   [phone] cell 11/05/2012, 11:26 AM

## 2012-11-05 NOTE — Progress Notes (Signed)
Physical Therapy Treatment Patient Details Name: [name] MRN: [id number] DOB: 02/08/1932 Today's Date: 11/05/2012 Time: 7829-5621 PT Time Calculation (min): 18 min  PT Assessment / Plan / Recommendation  History of Present Illness The patient is a 79 year old man with a history of symptomatic bradycardia and paroxysmal atrial fibrillation status post pacemaker insertion in 2010. He presented to the hospital with fevers, chills, and with subsequent found to have blood cultures, positive for methicillin sensitive staph aureus. He has been treated with intravenous antibiotics and his symptoms and fevers have improved. He has chronic abdominal pain. He denies heart failure symptoms. He denies syncope. The patient denies any recent infections   PT Comments   Patient did not do as well today.  His balance seems more unsteady and he seems more SOB today, both limiting treatment.  Feel at this point patient would need 24 hour assistance at home and thus recommend CIR evaluate patient for possible admission.  Patient needs continued PT to address balance, mobility and cardiopulmary status.     Follow Up Recommendations  CIR     Does the patient have the potential to tolerate intense rehabilitation     Barriers to Discharge        Equipment Recommendations  None recommended by PT    Recommendations for Other Services OT consult  Frequency Min 3X/week   Progress towards PT Goals Progress towards PT goals: Progressing toward goals  Plan Discharge plan needs to be updated    Precautions / Restrictions Precautions Precautions: Fall   Pertinent Vitals/Pain No pain indicated.    Mobility  Bed Mobility Bed Mobility: Supine to Sit Supine to Sit: 5: Supervision;HOB elevated;With rails Transfers Transfers: Sit to Stand;Stand to Sit Sit to Stand: 4: Min assist;With upper extremity assist;From bed Stand to Sit: 4: Min assist;With upper extremity assist;To chair/3-in-1 Details for  Transfer Assistance: assist to control descent to chair; asssit for balance Ambulation/Gait Ambulation/Gait Assistance: 4: Min assist Ambulation Distance (Feet): 50 Feet Assistive device: None Ambulation/Gait Assistance Details: patient with loss of balance several times, requiring assistance to regain; patient with increased pallor and SOB and ambulation ended by PT.  Patient placed in recliner and returned to room.  HR @70 ; color returned immediately upon sitting.  Patient unsteady during gait. Gait Pattern: Step-through pattern;Wide base of support    Exercises General Exercises - Lower Extremity Ankle Circles/Pumps: AROM;Both;10 reps;Seated;Strengthening Gluteal Sets: AROM;Both;10 reps;Seated;Strengthening Long Arc Quad: AROM;Both;10 reps;Seated;Strengthening Hip Flexion/Marching: AROM;Both;10 reps;Seated;Strengthening   PT Diagnosis:    PT Problem List:   PT Treatment Interventions:     PT Goals (current goals can now be found in the care plan section)    Visit Information  Last PT Received On: 11/05/12 Assistance Needed: +1 History of Present Illness: The patient is a 79 year old man with a history of symptomatic bradycardia and paroxysmal atrial fibrillation status post pacemaker insertion in 2010. He presented to the hospital with fevers, chills, and with subsequent found to have blood cultures, positive for methicillin sensitive staph aureus. He has been treated with intravenous antibiotics and his symptoms and fevers have improved. He has chronic abdominal pain. He denies heart failure symptoms. He denies syncope. The patient denies any recent infections    Subjective Data      Cognition  Cognition Arousal/Alertness: Awake/alert Behavior During Therapy: WFL for tasks assessed/performed Overall Cognitive Status: Within Functional Limits for tasks assessed    Balance  Balance Balance Assessed: Yes Dynamic Standing Balance Dynamic Standing - Balance Support: During  functional activity Dynamic Standing - Level of Assistance: 3: Mod assist  End of Session PT - End of Session Equipment Utilized During Treatment: Gait belt Activity Tolerance: Treatment limited secondary to medical complications (Comment) (increased pallor and SOB) Patient left: in chair;with call bell/phone within reach Nurse Communication: Mobility status;Other (comment) (RN present for events of session)   GP     Olivia Canter, [place] [phone] 11/05/2012, 2:36 PM

## 2012-11-05 NOTE — Progress Notes (Signed)
Patient Name: [name]      SUBJECTIVE: 79 year old male admitted with weakness and found to have staph aureus bacteremia  And known pancytopenia from a myelodysplastic syndrome    He has a previously implanted pacemaker for tachy brady syndromw and complete heart block. Yesterday he underwent extraction of his pacemaker implantation of a temporary-permanent pacemaker. TEE failed to show evidence of endocarditis it is anticipated that he will have 6 weeks of antibiotics.  There is some question as to whether he needs an MRI of his lumbar spine.   Past Medical History  Diagnosis Date  . Dyspnea   . 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   . Allergic rhinitis   . Hyperlipidemia   . Exogenous obesity   . Edema     venous insufficiency  . Diabetes mellitus 2012    T2DM  . Second degree Mobitz II AV block 12/11/08    with transient syncope, resolved s/p PPM  . Paroxysmal atrial fibrillation 03/16/11    diagnosed by PPM interrogation  . Pancytopenia   . Postural dizziness     Scheduled Meds:  Scheduled Meds: .  ceFAZolin (ANCEF) IV  2 g Intravenous Q8H  . escitalopram  10 mg Oral QHS  . famotidine  20 mg Oral QHS  . finasteride  5 mg Oral q morning - 10a  . fluticasone  2 spray Each Nare Daily  . furosemide  20 mg Oral Daily  . gentamicin irrigation   Irrigation Once  . heparin subcutaneous  5,000 Units Subcutaneous Q8H  . insulin aspart  0-9 Units Subcutaneous TID WC  . loratadine  10 mg Oral Daily  . mometasone-formoterol  2 puff Inhalation BID  . oxybutynin  10 mg Oral q morning - 10a  . pantoprazole  40 mg Oral Daily  . potassium chloride  20 mEq Oral Daily  . sodium chloride  3 mL Intravenous Q12H  . traZODone  50 mg Oral QHS   Continuous Infusions: . sodium chloride 50 mL/hr at 11/04/12 0511    PHYSICAL EXAM Filed Vitals:   11/04/12 2127 11/04/12 2230 11/05/12 0030 11/05/12 0518  BP:  107/62 103/81  101/74  Pulse:  71 86 71  Temp:    98.4 F (36.9 C)  TempSrc:    Oral  Resp:  22  22  Height:      Weight:      SpO2: 98% 98% 98% 98%    General appearance: alert, cooperative and no distress Lungs: clear to auscultation bilaterally Heart: regular rate and rhythm, S1, S2 normal, no murmur, click, rub or gallop and regular rate and rhythm Skin: Skin color, texture, turgor normal. No rashes or lesions Neurologic: Alert and oriented X 3, normal strength and tone.   Wound without hematoma or swelling   TELEMETRY: Reviewed telemetry pt in Vpacing   Intake/Output Summary (Last 24 hours) at 11/05/12 0923 Last data filed at 11/05/12 0707  Gross per 24 hour  Intake   2775 ml  Output   3390 ml  Net   -615 ml    LABS: Basic Metabolic Panel:  Recent Labs Lab 10/30/12 0723 10/31/12 0425 11/01/12 0437 11/02/12 0445 11/03/12 0410 11/04/12 0550  NA 126* 129* 129* 132* 136 133*  K 3.6 3.2* 3.0* 3.4* 3.4* 3.6  CL 92* 94* 92* 95* 97 95*  CO2 23 27 28 28 30 29   GLUCOSE 133* 129* 127* 139* 130* 122*  BUN 14 13 12 11 11 11   CREATININE 0.74 0.75 0.70 0.70 0.75 0.72  CALCIUM 8.5 8.9 8.8 8.9 8.9 9.1   Cardiac Enzymes: No results found for this basename: CKTOTAL, CKMB, CKMBINDEX, TROPONINI,  in the last 72 hours CBC:  Recent Labs Lab 10/30/12 0723 10/31/12 0425 11/01/12 0437 11/02/12 0445 11/03/12 0410 11/04/12 0550  WBC 1.7* 1.3* 0.9* 1.0* 1.3* 1.6*  NEUTROABS 1.3*  --   --  0.4* 0.8* 0.9*  HGB 9.0* 9.0* 8.7* 8.7* 8.5* 8.4*  HCT 25.8* 25.8* 24.9* 25.5* 25.3* 24.3*  MCV 100.0 99.6 100.4* 100.4* 102.0* 100.8*  PLT 64* 73* 65* 72* 74* 84*   PROTIME:  Recent Labs  11/04/12 0550  LABPROT 14.9  INR 1.20   Liver Function Tests:  Recent Labs  11/04/12 0550  AST 24  ALT 23  ALKPHOS 95  BILITOT 0.8  PROT 6.4  ALBUMIN 3.2*   No results found for this basename: LIPASE, AMYLASE,  in the last 72 hours BNP: BNP (last 3 results)  Recent Labs  10/28/12 0210  10/29/12 0510 10/31/12 0425  PROBNP 1971.0* 3717.0* 5067.0*     ASSESSMENT AND PLAN:  Principal Problem:   Bacteremia due to Staphylococcus aureus Active Problems:   LYMPHOMA NEC, MLIG, INGUINAL/LOWER LIMB   Essential hypertension, benign   GERD   DM (diabetes mellitus), secondary uncontrolled   Weakness   Abdominal pain   Acute encephalopathy   Other pancytopenia   Hyponatremia   Sepsis   Pacemaker infection  He will continue with temp-perm for duraton of abx although could be done earlier with neg blood cultures  Will defer plan to dr GT and ID  Signed, Sherryl Manges MD  11/05/2012

## 2012-11-06 DIAGNOSIS — E1365 Other specified diabetes mellitus with hyperglycemia: Secondary | ICD-10-CM

## 2012-11-06 LAB — GLUCOSE, CAPILLARY
Glucose-Capillary: 129 mg/dL — ABNORMAL HIGH (ref 70–99)
Glucose-Capillary: 129 mg/dL — ABNORMAL HIGH (ref 70–99)
Glucose-Capillary: 127 mg/dL — ABNORMAL HIGH (ref 70–99)
Glucose-Capillary: 127 mg/dL — ABNORMAL HIGH (ref 70–99)

## 2012-11-06 LAB — BASIC METABOLIC PANEL
BUN: 15 mg/dL (ref 6–23)
Chloride: 95 mEq/L — ABNORMAL LOW (ref 96–112)
Creatinine, Ser: 0.88 mg/dL (ref 0.50–1.35)
GFR calc Af Amer: >90 mL/min (ref >90)
Glucose, Bld: 129 mg/dL — ABNORMAL HIGH (ref 70–99)
Potassium: 3.9 mEq/L (ref 3.5–5.1)
Sodium: 132 mEq/L — ABNORMAL LOW (ref 135–145)

## 2012-11-06 LAB — CBC
HCT: 25.9 % — ABNORMAL LOW (ref 39.0–52.0)
Hemoglobin: 8.8 g/dL — ABNORMAL LOW (ref 13.0–17.0)
RBC: 2.58 MIL/uL — ABNORMAL LOW (ref 4.22–5.81)
RDW: 19.8 % — ABNORMAL HIGH (ref 11.5–15.5)
WBC: 1.6 10*3/uL — ABNORMAL LOW (ref 4.0–10.5)

## 2012-11-06 MED ORDER — LORAZEPAM 0.5 MG PO TABS: 0.5000 mg | ORAL_TABLET | Freq: Two times a day (BID) | ORAL | Status: DC | PRN

## 2012-11-06 NOTE — Progress Notes (Signed)
Patient ID: [name]  male  HQI:[id number]    DOB: 09/29/1932    DOA: 10/27/2012  PCP: Sandrea Hughs, MD  Assessment/Plan: Principal Problem: MSSA bacteremia/pacemaker infection/sepsis  - CT of the abdomen and pelvis was negative. TEE which was done on 10/31/2012 was negative for any vegetations.  - pacemaker removed on 10/10, repeat blood cultures done on 10/11 - If negative x 48hrs, will place PICC line tomorrow AM for IV antibiotics x 6 weeks -Continue with cefazolin, ID and cardiology following   Active problems  acute encephalopathy : Clinically improved   -  CT head was negative. Likely secondary to problem #1.  pancytopenia - Relatively chronic in nature and secondary to myelodysplastic syndrome.  - Patient's white count is essentially at baseline.  Patient has been seen by oncology and feel patient's pancytopenia is secondary to myelodysplastic syndrome. No need for any further transfusion or growth factor support per oncology.  abdominal pain : Resolved   -  CT abdomen and pelvis negative. Tolerating current diet. Continue supportive care.    Low back pain : Denies any specific pain at this time, plain films negative, not consistent with discitis.   Chronic grade 2 diastolic dysfunction  -  TEE on 10/31/2012 showed EF of 55-60%, normal wall motion, no aortic or mitral valve vegetation  - Continue Lasix   follicular lymphoma grade 3 diagnosed 2007  In remission per oncology. Followup as outpatient.  DVT Prophylaxis:  Code Status:  Disposition: PT recommending CIR, consult placed    Subjective: Denies any specific complaints, still waking up, at the time of my encounter, patient was looking forward to DC home after the PICC line  Objective: Weight change:   Intake/Output Summary (Last 24 hours) at 11/06/12 0928 Last data filed at 11/06/12 0900  Gross per 24 hour  Intake    600 ml  Output   1650 ml  Net  -1050 ml   Blood pressure 107/82, pulse 70,  temperature 97.8 F (36.6 C), temperature source Oral, resp. rate 20, height 5\' 11"  (1.803 m), weight 113.535 kg (250 lb 4.8 oz), SpO2 95.00%.  Physical Exam: General:NAD CVS: S1-S2 clear Chest: CTAB Abdomen: soft NT, ND, NBS  Extremities: no c/c/e bilaterally   Lab Results: Basic Metabolic Panel:  Recent Labs Lab 11/04/12 0550 11/06/12 0525  NA 133* 132*  K 3.6 3.9  CL 95* 95*  CO2 29 28  GLUCOSE 122* 129*  BUN 11 15  CREATININE 0.72 0.88  CALCIUM 9.1 9.2   Liver Function Tests:  Recent Labs Lab 11/04/12 0550  AST 24  ALT 23  ALKPHOS 95  BILITOT 0.8  PROT 6.4  ALBUMIN 3.2*   No results found for this basename: LIPASE, AMYLASE,  in the last 168 hours No results found for this basename: AMMONIA,  in the last 168 hours CBC:  Recent Labs Lab 11/04/12 0550 11/06/12 0525  WBC 1.6* 1.6*  NEUTROABS 0.9*  --   HGB 8.4* 8.8*  HCT 24.3* 25.9*  MCV 100.8* 100.4*  PLT 84* 74*   Cardiac Enzymes: No results found for this basename: CKTOTAL, CKMB, CKMBINDEX, TROPONINI,  in the last 168 hours BNP: No components found with this basename: POCBNP,  CBG:  Recent Labs Lab 11/05/12 0818 11/05/12 1207 11/05/12 1648 11/05/12 2221 11/06/12 0746  GLUCAP 128* 166* 164* 135* 129*     Micro Results: Recent Results (from the past 240 hour(s))  CULTURE, BLOOD (ROUTINE X 2)     Status: None  Collection Time    10/28/12  2:10 AM      Result Value Range Status   Specimen Description BLOOD RIGHT ANTECUBITAL   Final   Special Requests BOTTLES DRAWN AEROBIC AND ANAEROBIC 5CC   Final   Culture  Setup Time     Final   Value: 10/28/2012 08:57     Performed at [facility]   Culture     Final   Value: STAPHYLOCOCCUS AUREUS     Note: RIFAMPIN AND GENTAMICIN SHOULD NOT BE USED AS SINGLE DRUGS FOR TREATMENT OF STAPH INFECTIONS.     Note: Gram Stain Report Called to,Read Back By and Verified With: LINDSAY HUDSON ON 10/28/2012 AT 10:06P BY WILEJ     Performed at  [facility]   Report Status 10/30/2012 FINAL   Final   Organism ID, Bacteria STAPHYLOCOCCUS AUREUS   Final  CULTURE, BLOOD (ROUTINE X 2)     Status: None   Collection Time    10/28/12  2:15 AM      Result Value Range Status   Specimen Description BLOOD RIGHT ARM   Final   Special Requests BOTTLES DRAWN AEROBIC AND ANAEROBIC 3CC   Final   Culture  Setup Time     Final   Value: 10/28/2012 08:57     Performed at [facility]   Culture     Final   Value: STAPHYLOCOCCUS AUREUS     Note: SUSCEPTIBILITIES PERFORMED ON PREVIOUS CULTURE WITHIN THE LAST 5 DAYS.     Note: Gram Stain Report Called to,Read Back By and Verified With: LINDSAY HUDSON ON 10/28/2012 AT 10:06P BY WILEJ     Performed at [facility]   Report Status 10/30/2012 FINAL   Final  GRAM STAIN     Status: None   Collection Time    10/28/12 12:40 PM      Result Value Range Status   Specimen Description CSF   Final   Special Requests Normal   Final   Gram Stain     Final   Value: CYTOSPIN PREP.     NO ORGANISMS SEEN     NO WBC SEEN     Gram Stain Report Called to,Read Back By and Verified With: DEUTSCH,J. AT 1702 ON 10.03.14 BY LOVE,T.   Report Status 10/28/2012 FINAL   Final  FUNGUS CULTURE W SMEAR     Status: None   Collection Time    10/28/12 12:40 PM      Result Value Range Status   Specimen Description CSF   Final   Special Requests NONE   Final   Fungal Smear     Final   Value: NO YEAST OR FUNGAL ELEMENTS SEEN     Performed at [facility]   Culture     Final   Value: CULTURE IN PROGRESS FOR FOUR WEEKS     Performed at [facility]   Report Status PENDING   Incomplete  STOOL CULTURE     Status: None   Collection Time    10/29/12  7:37 AM      Result Value Range Status   Specimen Description STOOL   Final   Special Requests NONE   Final   Culture     Final   Value: NO SALMONELLA, SHIGELLA, CAMPYLOBACTER, YERSINIA, OR E.COLI 0157:H7 ISOLATED     Performed at [facility]   Report Status 11/01/2012 FINAL   Final  CULTURE, BLOOD (ROUTINE X 2)  Status: None   Collection Time    10/29/12 12:00 PM      Result Value Range Status   Specimen Description BLOOD RIGHT ARM  3 ML IN [facility] BOTTLE   Final   Special Requests NONE   Final   Culture  Setup Time     Final   Value: 10/29/2012 19:06     Performed at [facility]   Culture     Final   Value: NO GROWTH 5 DAYS     Performed at [facility]   Report Status 11/04/2012 FINAL   Final  CULTURE, BLOOD (ROUTINE X 2)     Status: None   Collection Time    10/29/12 12:18 PM      Result Value Range Status   Specimen Description BLOOD RIGHT HAND  4 ML IN [facility] BOTTLE   Final   Special Requests NONE   Final   Culture  Setup Time     Final   Value: 10/29/2012 21:55     Performed at [facility]   Culture     Final   Value: NO GROWTH 5 DAYS     Performed at [facility]   Report Status 11/04/2012 FINAL   Final  URINE CULTURE     Status: None   Collection Time    10/30/12  9:39 AM      Result Value Range Status   Specimen Description URINE, CATHETERIZED   Final   Special Requests NONE   Final   Culture  Setup Time     Final   Value: 10/30/2012 21:11     Performed at [facility] Count     Final   Value: NO GROWTH     Performed at [facility]   Culture     Final   Value: NO GROWTH     Performed at [facility]   Report Status 11/01/2012 FINAL   Final    Studies/Results: Dg Chest 2 View  10/28/2012   CLINICAL DATA:  Shortness of Breath.  EXAM: CHEST  2 VIEW  COMPARISON:  09/30/2012  FINDINGS: Left pacer remains in place, unchanged. Prior median sternotomy and CABG. Cardiomegaly with vascular congestion. No overt edema, effusions or acute bony abnormality.  IMPRESSION: Cardiomegaly, vascular congestion.   Electronically Signed   By: Charlett Nose M.D.   On: 10/28/2012 02:28   Dg Thoracic Spine 2 View  10/28/2012   CLINICAL DATA:  Back  pain. No injury.  EXAM: THORACIC SPINE - 2 VIEW  COMPARISON:  Sagittal reconstructed image from a chest CT, 10/28/2012 and lateral chest radiograph, 07/03/2011.  FINDINGS: No fracture or spondylolisthesis.  Mild disk degenerative changes are noted along the mid and lower thoracic spine reflected by small anterior endplate osteophytes. This appearance is stable from the prior lateral chest radiograph. The bones are demineralized.  The surrounding soft tissues are unremarkable.  IMPRESSION: No fracture or spondylolisthesis.  Mild degenerative changes, stable from the prior lateral chest radiograph.   Electronically Signed   By: Amie Portland M.D.   On: 10/28/2012 08:28   Dg Lumbar Spine 2-3 Views  10/28/2012   CLINICAL DATA:  Back pain  EXAM: LUMBAR SPINE - 2-3 VIEW  COMPARISON:  None.  FINDINGS: No fracture or spondylolisthesis.  Moderate loss of disc height with endplate sclerosis and osteophytes is noted at L1-L2. Remaining lumbar disc spaces are well preserved.  There is a mild curvature of the  upper lumbar spine, convex to the right.  The soft tissues are unremarkable.  IMPRESSION: No fracture or acute finding.  Disk degenerative changes at L1-L2.  Mild dextroscoliosis.   Electronically Signed   By: Amie Portland M.D.   On: 10/28/2012 08:25   Ct Head Wo Contrast  10/28/2012   *RADIOLOGY REPORT*  Clinical Data: Confusion  CT HEAD WITHOUT CONTRAST  Technique:  Contiguous axial images were obtained from the base of the skull through the vertex without contrast.  Comparison: Prior CT from 11/10/2008  Findings: There is no acute intracranial hemorrhage or infarct.  No mass or midline shift.  Mild hypo attenuation within the periventricular white matter is similar to prior exam, and most consistent with chronic small to some ischemic. Calcification along the quadrigeminal plate is unchanged.  CSF containing spaces are stable with asymmetric enlargement of the left lateral ventricle. No extra-axial fluid  collection.  Calvarium is intact.  Orbital soft tissues are within normal limits.  Scattered mucosal thickening is seen within the ethmoidal air cells bilaterally.  Mastoid air cells well pneumatized.  IMPRESSION: 1. Stable appearance of the the brain with no acute intra five.  2.  Ethmoidal sinus disease.   Original Report Authenticated By: Rise Mu, M.D.   Ct Angio Chest Pe W/cm &/or Wo Cm  10/28/2012   CLINICAL DATA:  Shortness of breath. Elevated D-dimer.  EXAM: CT ANGIOGRAPHY CHEST  CT ABDOMEN AND PELVIS WITH CONTRAST  TECHNIQUE: Multidetector CT imaging of the chest was performed using the standard protocol during bolus administration of intravenous contrast. Multiplanar CT image reconstructions including MIPs were obtained to evaluate the vascular anatomy. Multidetector CT imaging of the abdomen and pelvis was performed using the standard protocol during bolus administration of intravenous contrast.  CONTRAST:  OMNIPAQUE IOHEXOL 350 MG/ML SOLN  COMPARISON:  PET CT 07/02/2008  FINDINGS: CTA CHEST FINDINGS  No filling defects in the pulmonary arteries to suggest pulmonary emboli. Left-sided pacer is in place. Cardiomegaly. There are pericardial calcifications posteriorly. Scattered coronary artery calcifications. Prior CABG moderate-sized hiatal hernia. No mediastinal, hilar, or axillary adenopathy.  Areas of scarring/ fibrosis peripherally in the upper lobes. No confluent airspace opacities or effusions.  No acute bony abnormality.  CT ABDOMEN and PELVIS FINDINGS  Liver, spleen, pancreas, gallbladder, adrenals are unremarkable. Small bilateral renal cysts. No hydronephrosis.  Stomach and small bowel are decompressed. Right colonic wall appears mildly thickened with low-density appearance. This can be seen with old inflammatory bowel disease. No active inflammation currently. No free fluid, free air or adenopathy. Aorta and iliac vessels are normal caliber.  Small bilateral inguinal hernias  containing fat. Urinary bladder and prostate grossly unremarkable. No acute bony abnormality.  Review of the MIP images confirms the above findings.  IMPRESSION: CTA CHEST IMPRESSION  No evidence of pulmonary embolus.  Cardiomegaly.  CT ABDOMEN and PELVIS IMPRESSION  No acute findings in the abdomen or pelvis.  Fat density within the wall of the right colon may reflect prior inflammatory bowel disease. No active inflammation or acute findings currently.   Electronically Signed   By: Charlett Nose M.D.   On: 10/28/2012 05:05   Ct Abdomen Pelvis W Contrast  10/28/2012   CLINICAL DATA:  Shortness of breath. Elevated D-dimer.  EXAM: CT ANGIOGRAPHY CHEST  CT ABDOMEN AND PELVIS WITH CONTRAST  TECHNIQUE: Multidetector CT imaging of the chest was performed using the standard protocol during bolus administration of intravenous contrast. Multiplanar CT image reconstructions including MIPs were obtained to evaluate  the vascular anatomy. Multidetector CT imaging of the abdomen and pelvis was performed using the standard protocol during bolus administration of intravenous contrast.  CONTRAST:  OMNIPAQUE IOHEXOL 350 MG/ML SOLN  COMPARISON:  PET CT 07/02/2008  FINDINGS: CTA CHEST FINDINGS  No filling defects in the pulmonary arteries to suggest pulmonary emboli. Left-sided pacer is in place. Cardiomegaly. There are pericardial calcifications posteriorly. Scattered coronary artery calcifications. Prior CABG moderate-sized hiatal hernia. No mediastinal, hilar, or axillary adenopathy.  Areas of scarring/ fibrosis peripherally in the upper lobes. No confluent airspace opacities or effusions.  No acute bony abnormality.  CT ABDOMEN and PELVIS FINDINGS  Liver, spleen, pancreas, gallbladder, adrenals are unremarkable. Small bilateral renal cysts. No hydronephrosis.  Stomach and small bowel are decompressed. Right colonic wall appears mildly thickened with low-density appearance. This can be seen with old inflammatory bowel  disease. No active inflammation currently. No free fluid, free air or adenopathy. Aorta and iliac vessels are normal caliber.  Small bilateral inguinal hernias containing fat. Urinary bladder and prostate grossly unremarkable. No acute bony abnormality.  Review of the MIP images confirms the above findings.  IMPRESSION: CTA CHEST IMPRESSION  No evidence of pulmonary embolus.  Cardiomegaly.  CT ABDOMEN and PELVIS IMPRESSION  No acute findings in the abdomen or pelvis.  Fat density within the wall of the right colon may reflect prior inflammatory bowel disease. No active inflammation or acute findings currently.   Electronically Signed   By: Charlett Nose M.D.   On: 10/28/2012 05:05   Dg Fluoro Guide Ndl Plc/bx  10/28/2012   CLINICAL DATA:  Altered mental status.  EXAM: DIAGNOSTIC LUMBAR PUNCTURE UNDER FLUOROSCOPIC GUIDANCE  FLUOROSCOPY TIME:  0 min 42 seconds  PROCEDURE: Informed consent was obtained from the patient prior to the procedure, including potential complications of headache, allergy, and pain. With the patient prone, the lower back was prepped with Betadine. 1% Lidocaine was used for local anesthesia. Lumbar puncture was performed at the L3-4 level using a 20 gauge needle with return of clearCSF with an opening pressure of 29 cm water. 15ml of CSF were obtained for laboratory studies. The patient tolerated the procedure well and there were no apparent complications.  IMPRESSION: Successful diagnostic lumbar puncture under fluoroscopic guidance at the level of L3-4. No evidence of immediate complication.   Electronically Signed   By: Myles Rosenthal M.D.   On: 10/28/2012 16:35    Medications: Scheduled Meds: .  ceFAZolin (ANCEF) IV  2 g Intravenous Q8H  . escitalopram  10 mg Oral QHS  . famotidine  20 mg Oral QHS  . finasteride  5 mg Oral q morning - 10a  . fluticasone  2 spray Each Nare Daily  . furosemide  20 mg Oral Daily  . gentamicin irrigation   Irrigation Once  . heparin subcutaneous   5,000 Units Subcutaneous Q8H  . insulin aspart  0-9 Units Subcutaneous TID WC  . loratadine  10 mg Oral Daily  . mometasone-formoterol  2 puff Inhalation BID  . oxybutynin  10 mg Oral q morning - 10a  . pantoprazole  40 mg Oral Daily  . potassium chloride  20 mEq Oral Daily  . sodium chloride  3 mL Intravenous Q12H  . traZODone  50 mg Oral QHS      LOS: 10 days   Lyda Colcord M.D. [facility] 11/06/2012, 9:28 AM Pager: [phone]  If 7PM-7AM, please contact night-coverage [email or web address] Password [email or web address]

## 2012-11-06 NOTE — Progress Notes (Signed)
Rehab Admissions Coordinator Note:  Patient was screened by Clois Dupes for appropriateness for an Inpatient Acute Rehab Consult.  At this time, we await rehab consult which will be completed tomorrow.  Clois Dupes 11/06/2012, 9:46 AM  I can be reached at [phone].

## 2012-11-07 ENCOUNTER — Inpatient Hospital Stay (HOSPITAL_COMMUNITY): Payer: Medicare Other

## 2012-11-07 ENCOUNTER — Ambulatory Visit: Payer: Medicare Other | Admitting: Gastroenterology

## 2012-11-07 ENCOUNTER — Encounter (HOSPITAL_COMMUNITY): Payer: Self-pay | Admitting: *Deleted

## 2012-11-07 DIAGNOSIS — R5381 Other malaise: Secondary | ICD-10-CM

## 2012-11-07 DIAGNOSIS — A4189 Other specified sepsis: Secondary | ICD-10-CM

## 2012-11-07 LAB — BASIC METABOLIC PANEL
CO2: 29 mEq/L (ref 19–32)
Calcium: 8.8 mg/dL (ref 8.4–10.5)
Sodium: 131 mEq/L — ABNORMAL LOW (ref 135–145)

## 2012-11-07 LAB — GLUCOSE, CAPILLARY
Glucose-Capillary: 132 mg/dL — ABNORMAL HIGH (ref 70–99)
Glucose-Capillary: 154 mg/dL — ABNORMAL HIGH (ref 70–99)
Glucose-Capillary: 113 mg/dL — ABNORMAL HIGH (ref 70–99)
Glucose-Capillary: 140 mg/dL — ABNORMAL HIGH (ref 70–99)

## 2012-11-07 LAB — TYPE AND SCREEN
Antibody Screen: NEGATIVE
Unit division: 0

## 2012-11-07 LAB — CBC
HCT: 25.9 % — ABNORMAL LOW (ref 39.0–52.0)
MCH: 35.3 pg — ABNORMAL HIGH (ref 26.0–34.0)
Platelets: 74 10*3/uL — ABNORMAL LOW (ref 150–400)
RBC: 2.55 MIL/uL — ABNORMAL LOW (ref 4.22–5.81)
WBC: 1.5 10*3/uL — ABNORMAL LOW (ref 4.0–10.5)

## 2012-11-07 MED ORDER — SODIUM CHLORIDE 0.9 % IJ SOLN
10.0000 mL | INTRAMUSCULAR | Status: DC | PRN
  Administered 2012-11-07 – 2012-11-08 (×2): 10 mL

## 2012-11-07 MED ORDER — IOHEXOL 300 MG/ML  SOLN
100.0000 mL | Freq: Once | INTRAMUSCULAR | Status: AC | PRN
  Administered 2012-11-07: 100 mL via INTRAVENOUS

## 2012-11-07 NOTE — Progress Notes (Signed)
Physical Therapy Treatment Patient Details Name: [name] MRN: [id number] DOB: 07/27/1932 Today's Date: 11/07/2012 Time: 5784-6962 PT Time Calculation (min): 23 min  PT Assessment / Plan / Recommendation  History of Present Illness The patient is a 79 year old man with a history of symptomatic bradycardia and paroxysmal atrial fibrillation status post pacemaker insertion in 2010. He presented to the hospital with fevers, chills, and with subsequent found to have blood cultures, positive for methicillin sensitive staph aureus. He has been treated with intravenous antibiotics and his symptoms and fevers have improved. He has chronic abdominal pain. He denies heart failure symptoms. He denies syncope. The patient denies any recent infections   PT Comments   Pt making progress towards physical therapy goals. Pt performed gait training today with RW for energy conservation, however pt prefers not using an AD and asked to try walking without the RW. He did not demonstrate any LOB episodes without an AD, and was able to perform standing therapeutic exercise at the sink. Pt limited by fatigue today and reports mild SOB.  Follow Up Recommendations  Home health PT     Does the patient have the potential to tolerate intense rehabilitation     Barriers to Discharge        Equipment Recommendations  None recommended by PT    Recommendations for Other Services    Frequency Min 3X/week   Progress towards PT Goals Progress towards PT goals: Progressing toward goals  Plan Discharge plan needs to be updated    Precautions / Restrictions Precautions Precautions: Fall Restrictions Weight Bearing Restrictions: No   Pertinent Vitals/Pain Pt reports intermittent back pain throughout session.     Mobility  Bed Mobility Bed Mobility: Not assessed (Pt received up in chair) Transfers Transfers: Sit to Stand;Stand to Sit Sit to Stand: 4: Min guard;From chair/3-in-1;With upper extremity  assist Stand to Sit: 4: Min guard;To chair/3-in-1;With upper extremity assist Details for Transfer Assistance: VC's for hand placement when standing to/from a RW, but did not require cueing when performing transfers without an AD. Ambulation/Gait Ambulation/Gait Assistance: 4: Min guard Ambulation Distance (Feet): 150 Feet Assistive device: Rolling walker;None (75 feet with a RW and 75 feet without an AD) Ambulation/Gait Assistance Details: Pt had no LOB episodes during gait training. Pt reports fatigue but was not SOB. O2 monitor not working properly and O2 saturation not able to be recorded at that time. Stairs: No    Exercises General Exercises - Lower Extremity Heel Raises: 10 reps Mini-Sqauts: 10 reps   PT Diagnosis:    PT Problem List:   PT Treatment Interventions:     PT Goals (current goals can now be found in the care plan section) Acute Rehab PT Goals Patient Stated Goal: home. get better PT Goal Formulation: With patient Time For Goal Achievement: 11/16/12 Potential to Achieve Goals: Good  Visit Information  Last PT Received On: 11/07/12 Assistance Needed: +1 History of Present Illness: The patient is a 79 year old man with a history of symptomatic bradycardia and paroxysmal atrial fibrillation status post pacemaker insertion in 2010. He presented to the hospital with fevers, chills, and with subsequent found to have blood cultures, positive for methicillin sensitive staph aureus. He has been treated with intravenous antibiotics and his symptoms and fevers have improved. He has chronic abdominal pain. He denies heart failure symptoms. He denies syncope. The patient denies any recent infections    Subjective Data  Subjective: "I don't need that walker." Patient Stated Goal: home. get better  Cognition  Cognition Arousal/Alertness: Awake/alert Behavior During Therapy: WFL for tasks assessed/performed Overall Cognitive Status: Within Functional Limits for tasks assessed     Balance  Balance Balance Assessed: Yes Dynamic Standing Balance Dynamic Standing - Balance Support: Bilateral upper extremity supported Dynamic Standing - Level of Assistance: 5: Stand by assistance  End of Session PT - End of Session Equipment Utilized During Treatment: Gait belt Activity Tolerance: Patient limited by fatigue Patient left: in chair;with call bell/phone within reach Nurse Communication: Mobility status   GP     Ruthann Cancer 11/07/2012, 1:16 PM  Ruthann Cancer, PT, DPT Acute Rehabilitation Services [phone]

## 2012-11-07 NOTE — Progress Notes (Signed)
Occupational Therapy Treatment Patient Details Name: [name] MRN: [id number] DOB: [birth year] Today's Date: 11/07/2012 Time: 7829-5621 OT Time Calculation (min): 17 min  OT Assessment / Plan / Recommendation  History of present illness The patient is a 79 year old man with a history of symptomatic bradycardia and paroxysmal atrial fibrillation status post pacemaker insertion in 2010. He presented to the hospital with fevers, chills, and with subsequent found to have blood cultures, positive for methicillin sensitive staph aureus. He has been treated with intravenous antibiotics and his symptoms and fevers have improved. He has chronic abdominal pain. He denies heart failure symptoms. He denies syncope. The patient denies any recent infections   OT comments  Pt making excellent progress. Feel pt is ready to D/C  Home with 24/7 S of family when medically stable.  Follow Up Recommendations  Home health OT;Supervision - Intermittent    Barriers to Discharge       Equipment Recommendations  None recommended by OT    Recommendations for Other Services    Frequency Min 2X/week   Progress towards OT Goals Progress towards OT goals: Progressing toward goals  Plan Discharge plan remains appropriate    Precautions / Restrictions Precautions Precautions: Fall   Pertinent Vitals/Pain no apparent distress     ADL  Grooming: Set up Where Assessed - Grooming: Unsupported sitting Upper Body Bathing: Set up Where Assessed - Upper Body Bathing: Unsupported sitting Lower Body Bathing: Set up;Supervision/safety Where Assessed - Lower Body Bathing: Supported sit to stand Upper Body Dressing: Set up Where Assessed - Upper Body Dressing: Unsupported sitting Lower Body Dressing: Moderate assistance Where Assessed - Lower Body Dressing: Unsupported standing Toilet Transfer: Min guard Toilet Transfer Method: Sit to stand Transfers/Ambulation Related to ADLs: significant improvement ADL  Comments: discussed E conservation and home safety    OT Diagnosis:    OT Problem List:   OT Treatment Interventions:     OT Goals(current goals can now be found in the care plan section) Acute Rehab OT Goals Patient Stated Goal: home. get better OT Goal Formulation: With patient Time For Goal Achievement: 11/16/12 Potential to Achieve Goals: Good ADL Goals Pt Will Perform Grooming: with supervision;standing Pt Will Perform Upper Body Bathing: with supervision;sitting Pt Will Perform Upper Body Dressing: with supervision;sitting Pt Will Perform Lower Body Dressing: with supervision;sit to/from stand Pt Will Transfer to Toilet: with supervision;regular height toilet;ambulating Pt Will Perform Toileting - Clothing Manipulation and hygiene: with supervision;sit to/from stand  Visit Information  Last OT Received On: 11/07/12 Assistance Needed: +1 History of Present Illness: The patient is a 79 year old man with a history of symptomatic bradycardia and paroxysmal atrial fibrillation status post pacemaker insertion in 2010. He presented to the hospital with fevers, chills, and with subsequent found to have blood cultures, positive for methicillin sensitive staph aureus. He has been treated with intravenous antibiotics and his symptoms and fevers have improved. He has chronic abdominal pain. He denies heart failure symptoms. He denies syncope. The patient denies any recent infections    Subjective Data      Prior Functioning       Cognition  Cognition Arousal/Alertness: Awake/alert Behavior During Therapy: WFL for tasks assessed/performed Overall Cognitive Status: Within Functional Limits for tasks assessed    Mobility  Bed Mobility Bed Mobility: Supine to Sit;Sitting - Scoot to Edge of Bed Supine to Sit: 6: Modified independent (Device/Increase time) Sitting - Scoot to Edge of Bed: 6: Modified independent (Device/Increase time) Transfers Sit to Stand: 5: Supervision Stand to  Sit: 5: Supervision    Exercises  Other Exercises Other Exercises: BUE theraband level 1   Balance     End of Session OT - End of Session Activity Tolerance: Patient tolerated treatment well Patient left: in bed;with call bell/phone within reach Nurse Communication: Mobility status  GO     Cing Pegram,HILLARY 11/07/2012, 6:31 PM [facility], OTR/L  [phone] 11/07/2012

## 2012-11-07 NOTE — Progress Notes (Addendum)
[facility]  Date of Admission:  10/27/2012  Antibiotics: Antibiotics Given (last 72 hours)   Date/Time Action Medication Dose Rate   11/04/12 0958 Given   ceFAZolin (ANCEF) IVPB 2 g/50 mL premix 2 g 100 mL/hr   11/04/12 1540 Given   ceFAZolin (ANCEF) IVPB 2 g/50 mL premix 2 g    11/04/12 1630 Given  [exp 11/05/12]   gentamicin (GARAMYCIN) 80 mg in sodium chloride irrigation 0.9 % 500 mL irrigation     11/04/12 2210 Given   ceFAZolin (ANCEF) IVPB 2 g/50 mL premix 2 g 100 mL/hr   11/05/12 0707 Given   ceFAZolin (ANCEF) IVPB 2 g/50 mL premix 2 g 100 mL/hr   11/05/12 1538 Given   ceFAZolin (ANCEF) IVPB 2 g/50 mL premix 2 g 100 mL/hr   11/05/12 2223 Given   ceFAZolin (ANCEF) IVPB 2 g/50 mL premix 2 g 100 mL/hr   11/06/12 0602 Given   ceFAZolin (ANCEF) IVPB 2 g/50 mL premix 2 g 100 mL/hr   11/06/12 1508 Given   ceFAZolin (ANCEF) IVPB 2 g/50 mL premix 2 g 100 mL/hr   11/06/12 2228 Given   ceFAZolin (ANCEF) IVPB 2 g/50 mL premix 2 g 100 mL/hr   11/07/12 1610 Given   ceFAZolin (ANCEF) IVPB 2 g/50 mL premix 2 g 100 mL/hr      Subjective: Some back pain again  Objective: Temp:  [97.8 F (36.6 C)-98.5 F (36.9 C)] 98.2 F (36.8 C) (10/13 0500) Pulse Rate:  [69-70] 69 (10/13 0500) Resp:  [18-20] 18 (10/13 0500) BP: (107-129)/(52-100) 125/72 mmHg (10/13 0500) SpO2:  [93 %-95 %] 93 % (10/13 0500) Weight:  [250 lb 8 oz (113.626 kg)] 250 lb 8 oz (113.626 kg) (10/13 0500)  General: Awake, alert nad Skin: no rashes, pm site wrapped Lungs: CTAB Cor: RRR Abdomen: soft, nt, nd, +bs Ext: no edema  Lab Results Lab Results  Component Value Date   WBC 1.5* 11/07/2012   HGB 9.0* 11/07/2012   HCT 25.9* 11/07/2012   MCV 101.6* 11/07/2012   PLT 74* 11/07/2012    Lab Results  Component Value Date   CREATININE 0.86 11/07/2012   BUN 14 11/07/2012   NA 131* 11/07/2012   K 4.5 11/07/2012   CL 95* 11/07/2012   CO2 29 11/07/2012    Lab Results   Component Value Date   ALT 23 11/04/2012   AST 24 11/04/2012   ALKPHOS 95 11/04/2012   BILITOT 0.8 11/04/2012      Microbiology: Recent Results (from the past 240 hour(s))  GRAM STAIN     Status: None   Collection Time    10/28/12 12:40 PM      Result Value Range Status   Specimen Description CSF   Final   Special Requests Normal   Final   Gram Stain     Final   Value: CYTOSPIN PREP.     NO ORGANISMS SEEN     NO WBC SEEN     Gram Stain Report Called to,Read Back By and Verified With: DEUTSCH,J. AT 1702 ON 10.03.14 BY LOVE,T.   Report Status 10/28/2012 FINAL   Final  FUNGUS CULTURE W SMEAR     Status: None   Collection Time    10/28/12 12:40 PM      Result Value Range Status   Specimen Description CSF   Final   Special Requests NONE   Final   Fungal Smear     Final  Value: NO YEAST OR FUNGAL ELEMENTS SEEN     Performed at [facility]   Culture     Final   Value: CULTURE IN PROGRESS FOR FOUR WEEKS     Performed at [facility]   Report Status PENDING   Incomplete  STOOL CULTURE     Status: None   Collection Time    10/29/12  7:37 AM      Result Value Range Status   Specimen Description STOOL   Final   Special Requests NONE   Final   Culture     Final   Value: NO SALMONELLA, SHIGELLA, CAMPYLOBACTER, YERSINIA, OR E.COLI 0157:H7 ISOLATED     Performed at [facility]   Report Status 11/01/2012 FINAL   Final  CULTURE, BLOOD (ROUTINE X 2)     Status: None   Collection Time    10/29/12 12:00 PM      Result Value Range Status   Specimen Description BLOOD RIGHT ARM  3 ML IN [facility] BOTTLE   Final   Special Requests NONE   Final   Culture  Setup Time     Final   Value: 10/29/2012 19:06     Performed at [facility]   Culture     Final   Value: NO GROWTH 5 DAYS     Performed at [facility]   Report Status 11/04/2012 FINAL   Final  CULTURE, BLOOD (ROUTINE X 2)     Status: None   Collection Time    10/29/12 12:18 PM      Result  Value Range Status   Specimen Description BLOOD RIGHT HAND  4 ML IN [facility] BOTTLE   Final   Special Requests NONE   Final   Culture  Setup Time     Final   Value: 10/29/2012 21:55     Performed at [facility]   Culture     Final   Value: NO GROWTH 5 DAYS     Performed at [facility]   Report Status 11/04/2012 FINAL   Final  URINE CULTURE     Status: None   Collection Time    10/30/12  9:39 AM      Result Value Range Status   Specimen Description URINE, CATHETERIZED   Final   Special Requests NONE   Final   Culture  Setup Time     Final   Value: 10/30/2012 21:11     Performed at [facility] Count     Final   Value: NO GROWTH     Performed at [facility]   Culture     Final   Value: NO GROWTH     Performed at [facility]   Report Status 11/01/2012 FINAL   Final  CULTURE, BLOOD (ROUTINE X 2)     Status: None   Collection Time    11/05/12  9:45 AM      Result Value Range Status   Specimen Description BLOOD RIGHT ARM   Final   Special Requests BOTTLES DRAWN AEROBIC AND ANAEROBIC 10CC EACH   Final   Culture  Setup Time     Final   Value: 11/05/2012 20:04     Performed at [facility]   Culture     Final   Value:        BLOOD CULTURE RECEIVED NO GROWTH TO DATE CULTURE WILL BE HELD FOR 5 DAYS BEFORE ISSUING  A FINAL NEGATIVE REPORT     Performed at [facility]   Report Status PENDING   Incomplete  CULTURE, BLOOD (ROUTINE X 2)     Status: None   Collection Time    11/05/12 10:00 AM      Result Value Range Status   Specimen Description BLOOD LEFT ARM   Final   Special Requests BOTTLES DRAWN AEROBIC AND ANAEROBIC 10CC EACH   Final   Culture  Setup Time     Final   Value: 11/05/2012 20:05     Performed at [facility]   Culture     Final   Value:        BLOOD CULTURE RECEIVED NO GROWTH TO DATE CULTURE WILL BE HELD FOR 5 DAYS BEFORE ISSUING A FINAL NEGATIVE REPORT     Performed at [facility]    Report Status PENDING   Incomplete    Studies/Results: No results found.  Assessment/Plan: 1)  MSSA pacemaker infection with bacteremia - repeat blood culture remained negative, pacemaker out.    -he again is complaining of back pain and all started with onset of infection so I will check CT scan.  It may extend his antibiotic course if positive so I think it is worth doing   -continue with cefazolin 2 g q 8 hours for 6 weeks from today after removal of pm through 11/21 per home health protocol -weekly cbc, cmp to [facility] -we will arrange follow up in [facility] in about 2-3 weeks  ADDENDUM: CT noted, no signs of disc osteomyelitis or abscess.  No changes in duration, it does show significant non  Infectious findings likely as cause of pain.    I will sign off, please call with questions, thanks   Staci Righter, MD [email or web address] [phone] pager   [phone] cell 11/07/2012, 9:42 AM

## 2012-11-07 NOTE — Progress Notes (Signed)
Not able to justify medical need for inpt rehab at this time. Please call me for any questions. Home Health with 24/7 assist of family recommended. [phone]

## 2012-11-07 NOTE — Progress Notes (Signed)
[facility]  Patient Status: [name] is a new pt to [facility] this admission.  [facility] is providing the following services: HHRN, PT if ordered, and home infusion pharmacy services for 6 week course of IV ABX at home.  [facility] has started in hospital teaching with son and daughter in law when pt was at [facility] for IV ABX administration to support  [place] at home.  [facility] hospital team will continue to follow and teach as inpatient at [facility] campus to support DC home when deemed medically ready.  If patient discharges after hours, please call [phone].   Sedalia Muta 11/07/2012, 1:05 PM

## 2012-11-07 NOTE — Consult Note (Signed)
Physical Medicine and Rehabilitation Consult Reason for Consult: Acute encephalopathy/MSSA bacteremia Referring Physician: [facility]   HPI: [name] is a 79 y.o. right-handed male with history of follicular lymphoma grade 3 diagnosed 2007 currently in remission and pancytopenia, diabetes mellitus and peripheral neuropathy, PAF with pacemaker insertion 2010. Patient was independent in driving prior to admission living alone. Presented 10/28/2012 with fever, chills and subsequent blood cultures positive MRSA. History intravenous antibiotics. He had no obvious vegetation on transesophageal echocardiogram. Infectious disease consulted recommendations of extraction of his pacing system and 6 weeks of antibiotics followed by reinsertion of new system which took place 11/04/2012 by Dr. Gilman Schmidt of cardiology services with insertion of temporary transvenous pacemaker. Plan is for cefazolin and 2 g every 8 hours for 6 weeks through 12/16/2012. Hospital course bouts of intermittent confusion suspect acute encephalopathy with gradual improvement. Patient noted chronic abdominal pain CT and abdomen and pelvis films been negative with conservative care provided. Physical therapy evaluation completed ongoing as of 11/05/2012 noted balance to be unsteady some shortness of breath both limiting history. Recommendations of the major physical medicine rehabilitation consult to consider inpatient rehabilitation services.   Review of Systems  Respiratory: Positive for shortness of breath.   Cardiovascular: Positive for palpitations and leg swelling.  Genitourinary: Positive for urgency.  Musculoskeletal: Positive for myalgias.  Neurological: Positive for dizziness.  Psychiatric/Behavioral: Positive for depression.  All other systems reviewed and are negative.   Past Medical History  Diagnosis Date  . Dyspnea   . 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    . Allergic rhinitis   . Hyperlipidemia   . Exogenous obesity   . Edema     venous insufficiency  . Diabetes mellitus 2012    T2DM  . Second degree Mobitz II AV block 12/11/08    with transient syncope, resolved s/p PPM  . Paroxysmal atrial fibrillation 03/16/11    diagnosed by PPM interrogation  . Pancytopenia   . Postural dizziness    Past Surgical History  Procedure Laterality Date  . Lymph node biopsy      groin  . Sternotomy  2000  . Pericardiectomy  2000  . Penile prosthesis implant      and removal  . Pacemaker insertion  12/11/08    MDT implanted by Dr Johney Frame  . Tee without cardioversion N/A 10/31/2012    Procedure: TRANSESOPHAGEAL ECHOCARDIOGRAM (TEE);  Surgeon: Lewayne Bunting, MD;  Location: [facility] ENDOSCOPY;  Service: Cardiovascular;  Laterality: N/A;   Family History  Problem Relation Age of Onset  . Diabetes Mother   . Liver cancer Brother   . Cancer Sister    Social History:  reports that he quit smoking about 44 years ago. His smoking use included Cigarettes. He has a 30 pack-year smoking history. He has quit using smokeless tobacco. He reports that he does not drink alcohol or use illicit drugs. Allergies: No Known Allergies Medications Prior to Admission  Medication Sig Dispense Refill  . acetaminophen (TYLENOL) 500 MG tablet Take 500-1,000 mg by mouth every 6 (six) hours as needed.       [name] albuterol (PROVENTIL HFA;VENTOLIN HFA) 108 (90 BASE) MCG/ACT inhaler Inhale 2 puffs into the lungs every 4 (four) hours as needed for wheezing or shortness of breath.       [name] aspirin 81 MG tablet Take 81 mg by mouth every morning. (hold if bleeding)      . calcium-vitamin  D (OS-CAL 500 + D) 500-200 MG-UNIT per tablet Take 1 tablet by mouth 2 (two) times daily.        . cetirizine (ZYRTEC) 10 MG tablet Take 10 mg by mouth at bedtime as needed for allergies.      . Cholecalciferol (VITAMIN D) 1000 UNITS capsule Take 1,000 Units by mouth 2 (two) times daily.        [name]  dextromethorphan-guaiFENesin (MUCINEX DM) 30-600 MG per 12 hr tablet Take 1 tablet by mouth every 12 (twelve) hours as needed (cough).       . escitalopram (LEXAPRO) 20 MG tablet Take 10 mg by mouth at bedtime.       . famotidine (PEPCID) 20 MG tablet Take 20 mg by mouth at bedtime.       . finasteride (PROSCAR) 5 MG tablet Take 5 mg by mouth every morning.       . furosemide (LASIX) 20 MG tablet Take 20 mg by mouth daily. May take 1 extra daily if needed for swelling in legs      . hydrocortisone cream 1 % Apply 1 application topically 2 (two) times daily as needed (itchy rash).       . metFORMIN (GLUCOPHAGE) 500 MG tablet Take 500 mg by mouth 2 (two) times daily with a meal.       . methylcellulose (CITRUCEL) oral powder 1 scoop in 8oz of water at bedtime      . mometasone (NASONEX) 50 MCG/ACT nasal spray Place 2 sprays into the nose 2 (two) times daily.       . mometasone-formoterol (DULERA) 200-5 MCG/ACT AERO Inhale 2 puffs into the lungs 2 (two) times daily.      . Multiple Vitamin (MULTIVITAMIN) tablet Take 1 tablet by mouth daily.        [name] omeprazole (PRILOSEC) 20 MG capsule Take 40 mg by mouth daily before breakfast.       . oxybutynin (DITROPAN) 5 MG tablet Take 10 mg by mouth every morning.       . polyethylene glycol powder (MIRALAX) powder Take 17 g by mouth at bedtime.       . potassium chloride SA (K-DUR,KLOR-CON) 20 MEQ tablet Take 20 mEq by mouth daily.      . sulindac (CLINORIL) 200 MG tablet Take 200 mg by mouth 2 (two) times daily as needed (pain).       . traMADol (ULTRAM) 50 MG tablet Take 50-100 mg by mouth every 6 (six) hours as needed for pain (cough).      . traZODone (DESYREL) 50 MG tablet Take 50 mg by mouth at bedtime.        Home: Home Living Family/patient expects to be discharged to:: Private residence Living Arrangements: Alone Available Help at Discharge: Family Type of Home: House Home Access: Level entry Home Layout: One level Home Equipment: Environmental consultant - 4  wheels;Cane - single point  Functional History:   Functional Status:  Mobility: Bed Mobility Bed Mobility: Supine to Sit Supine to Sit: 5: Supervision;HOB elevated;With rails Transfers Transfers: Sit to Stand;Stand to Sit Sit to Stand: 4: Min assist;With upper extremity assist;From bed Stand to Sit: 4: Min assist;With upper extremity assist;To chair/3-in-1 Ambulation/Gait Ambulation/Gait Assistance: 4: Min assist Ambulation Distance (Feet): 50 Feet Assistive device: None Ambulation/Gait Assistance Details: patient with loss of balance several times, requiring assistance to regain; patient with increased pallor and SOB and ambulation ended by PT.  Patient placed in recliner and returned to room.  HR @70 ; color  returned immediately upon sitting.  Patient unsteady during gait. Gait Pattern: Step-through pattern;Wide base of support    ADL: ADL Grooming: Min guard Where Assessed - Grooming: Supported sitting Upper Body Bathing: Minimal assistance Where Assessed - Upper Body Bathing: Supported sitting Lower Body Bathing: Simulated;Minimal assistance Where Assessed - Lower Body Bathing: Supported sit to stand Upper Body Dressing: Minimal assistance Where Assessed - Upper Body Dressing: Supported sitting Lower Body Dressing: Maximal assistance Where Assessed - Lower Body Dressing: Supported sit to Pharmacist, hospital: Performed;Minimal Dentist Method: Sit to Barista: Regular height toilet;Grab bars Equipment Used: Gait belt Transfers/Ambulation Related to ADLs: Pt states that he feels ligh headed when he stands ADL Comments: family members present  Cognition: Cognition Overall Cognitive Status: Within Functional Limits for tasks assessed Orientation Level: Oriented X4 Cognition Arousal/Alertness: Awake/alert Behavior During Therapy: WFL for tasks assessed/performed Overall Cognitive Status: Within Functional Limits for tasks  assessed  Blood pressure 129/100, pulse 70, temperature 97.8 F (36.6 C), temperature source Oral, resp. rate 20, height 5\' 11"  (1.803 m), weight 113.535 kg (250 lb 4.8 oz), SpO2 95.00%. Physical Exam  Vitals reviewed. HENT:  Head: Normocephalic.  Eyes: EOM are normal.  Neck: Normal range of motion. Neck supple. No thyromegaly present.  Cardiovascular:  Cardiac rate controlled  Respiratory: Effort normal and breath sounds normal. No respiratory distress.  GI: Soft. Bowel sounds are normal. He exhibits no distension.  Musculoskeletal:  +1 edema lower extremities  Neurological: He is alert.  Patient was oriented to name, place age and date of birth. He did follow simple commands. Patient did display some decrease short-term memory deficits his hospital course and recall of events. Left arm limited in sling. No focal motor-sensory deficits. Feet are hypersensitive to touch.  Skin: Skin is warm and dry.  Psychiatric: He has a normal mood and affect. His behavior is normal. Thought content normal.    Results for orders placed during the hospital encounter of 10/27/12 (from the past 24 hour(s))  GLUCOSE, CAPILLARY     Status: Abnormal   Collection Time    11/06/12  7:46 AM      Result Value Range   Glucose-Capillary 129 (*) 70 - 99 mg/dL  GLUCOSE, CAPILLARY     Status: Abnormal   Collection Time    11/06/12 12:10 PM      Result Value Range   Glucose-Capillary 129 (*) 70 - 99 mg/dL  GLUCOSE, CAPILLARY     Status: Abnormal   Collection Time    11/06/12  4:37 PM      Result Value Range   Glucose-Capillary 127 (*) 70 - 99 mg/dL  GLUCOSE, CAPILLARY     Status: Abnormal   Collection Time    11/06/12  8:45 PM      Result Value Range   Glucose-Capillary 127 (*) 70 - 99 mg/dL  CBC     Status: Abnormal   Collection Time    11/07/12  4:52 AM      Result Value Range   WBC 1.5 (*) 4.0 - 10.5 K/uL   RBC 2.55 (*) 4.22 - 5.81 MIL/uL   Hemoglobin 9.0 (*) 13.0 - 17.0 g/dL   HCT 16.1 (*)  09.6 - 52.0 %   MCV 101.6 (*) 78.0 - 100.0 fL   MCH 35.3 (*) 26.0 - 34.0 pg   MCHC 34.7  30.0 - 36.0 g/dL   RDW 04.5 (*) 40.9 - 81.1 %   Platelets 74 (*) 150 - 400  K/uL  BASIC METABOLIC PANEL     Status: Abnormal   Collection Time    11/07/12  4:52 AM      Result Value Range   Sodium 131 (*) 135 - 145 mEq/L   Potassium 4.5  3.5 - 5.1 mEq/L   Chloride 95 (*) 96 - 112 mEq/L   CO2 29  19 - 32 mEq/L   Glucose, Bld 124 (*) 70 - 99 mg/dL   BUN 14  6 - 23 mg/dL   Creatinine, Ser 4.09  0.50 - 1.35 mg/dL   Calcium 8.8  8.4 - 81.1 mg/dL   GFR calc non Af Amer 81 (*) >90 mL/min   GFR calc Af Amer >90  >90 mL/min   Dg Chest 2 View  11/05/2012   CLINICAL DATA:  Extraction of old pacer.  EXAM: CHEST  2 VIEW  COMPARISON:  10/28/2012  FINDINGS: There is a new pacemaker in the left chest. Patient now has a single cardiac lead that extends into the region of the right ventricle. There is no evidence for a pneumothorax. Again noted is enlargement of the cardiac silhouette. No significant airspace disease and no evidence for pleural effusions.  IMPRESSION: Placement of new cardiac pacemaker. No evidence for pneumothorax.  Cardiomegaly.   Electronically Signed   By: Richarda Overlie M.D.   On: 11/05/2012 07:46    Assessment/Plan: Diagnosis: deconditioning related to sepsis,infected pacer 1. Does the need for close, 24 hr/day medical supervision in concert with the patient's rehab needs make it unreasonable for this patient to be served in a less intensive setting? No 2. Co-Morbidities requiring supervision/potential complications: dm 3. Due to bladder management, bowel management, safety, skin/wound care, disease management, medication administration, pain management and patient education, does the patient require 24 hr/day rehab nursing? No 4. Does the patient require coordinated care of a physician, rehab nurse, PT (1-2 hrs/day, 5 days/week) and OT (1-2 hrs/day, 5 days/week) to address physical and functional  deficits in the context of the above medical diagnosis(es)? No Addressing deficits in the following areas: balance, endurance, locomotion, strength, transferring, bowel/bladder control, bathing, dressing, feeding, grooming and toileting 5. Can the patient actively participate in an intensive therapy program of at least 3 hrs of therapy per day at least 5 days per week? Potentially 6. The potential for patient to make measurable gains while on inpatient rehab is fair 7. Anticipated functional outcomes upon discharge from inpatient rehab are n/a with PT, n/a with OT, n/a with SLP. 8. Estimated rehab length of stay to reach the above functional goals is: n/a 9. Does the patient have adequate social supports to accommodate these discharge functional goals? Potentially 10. Anticipated D/C setting: Home 11. Anticipated post D/C treatments: HH therapy 12. Overall Rehab/Functional Prognosis: good  RECOMMENDATIONS: This patient's condition is appropriate for continued rehabilitative care in the following setting: [facility] Patient has agreed to participate in recommended program. Potentially Note that insurance prior authorization may be required for reimbursement for recommended care.  Comment: Pt is nearing the level he will be at until his permament pacer is placed. Family does not want SNF, nor can I justify inpatient rehab. When medically stable, he could go home with home health services and supervision.  Ranelle Oyster, MD, [facility] Physical Medicine & Rehabilitation     11/07/2012

## 2012-11-07 NOTE — Progress Notes (Signed)
Patient ID: [name]  male  ZOX:[id number]    DOB: 06-13-1932    DOA: 10/27/2012  PCP: Sandrea Hughs, MD  Assessment/Plan: Principal Problem: MSSA bacteremia/pacemaker infection/sepsis  - CT of the abdomen and pelvis was negative. TEE which was done on 10/31/2012 was negative for any vegetations.  - pacemaker removed on 10/10, repeat blood cultures done on 10/11 are negative so far, place PICC line, plan IV antibiotics for 6 weeks -Continue with cefazolin, ID and cardiology following   Back pain: Patient is complaining of lower back pain this morning - Discussed with ID, Dr. Luciana Axe, discontinued MRI due to temporary pacemaker, obtain CT of the lumbar spine with contrast to rule out any epidural abscess, osteomyelitis or discitis    acute encephalopathy : Clinically improved   -  CT head was negative. Likely secondary to problem #1.  pancytopenia - Relatively chronic in nature and secondary to myelodysplastic syndrome.  - Patient's white count is essentially at baseline.  Patient has been seen by oncology and feel patient's pancytopenia is secondary to myelodysplastic syndrome. No need for any further transfusion or growth factor support per oncology.  abdominal pain : Resolved   -  CT abdomen and pelvis negative. Tolerating current diet. Continue supportive care.   Chronic grade 2 diastolic dysfunction  -  TEE on 10/31/2012 showed EF of 55-60%, normal wall motion, no aortic or mitral valve vegetation  - Continue Lasix   follicular lymphoma grade 3 diagnosed 2007  In remission per oncology. Followup as outpatient.  DVT Prophylaxis:  Code Status:  Disposition: PT recommended CIR but was declined. Today patient states that he had walked to the bathroom by himself. Hopefully we'll be able to get him home tomorrow with home health physical therapy and home health hours. Discussed in detail with patient's daughter at the bedside, she can stay with him for some  time.    Subjective: Complaining of low back pain, was able to walk to the bathroom, otherwise denies any chest pain or shortness of breath fevers or chills no acute issues overnight  Objective: Weight change: 0.091 kg (3.2 oz)  Intake/Output Summary (Last 24 hours) at 11/07/12 1029 Last data filed at 11/06/12 2100  Gross per 24 hour  Intake    360 ml  Output   1125 ml  Net   -765 ml   Blood pressure 125/72, pulse 69, temperature 98.2 F (36.8 C), temperature source Oral, resp. rate 18, height 5\' 11"  (1.803 m), weight 113.626 kg (250 lb 8 oz), SpO2 93.00%.  Physical Exam: General:NAD, alert and oriented x3 CVS: S1-S2 clear, Chest: CTAB, pacemaker site clean Abdomen: soft NT, ND, NBS  Extremities: no c/c/e bilaterally   Lab Results: Basic Metabolic Panel:  Recent Labs Lab 11/06/12 0525 11/07/12 0452  NA 132* 131*  K 3.9 4.5  CL 95* 95*  CO2 28 29  GLUCOSE 129* 124*  BUN 15 14  CREATININE 0.88 0.86  CALCIUM 9.2 8.8   Liver Function Tests:  Recent Labs Lab 11/04/12 0550  AST 24  ALT 23  ALKPHOS 95  BILITOT 0.8  PROT 6.4  ALBUMIN 3.2*   No results found for this basename: LIPASE, AMYLASE,  in the last 168 hours No results found for this basename: AMMONIA,  in the last 168 hours CBC:  Recent Labs Lab 11/04/12 0550 11/06/12 0525 11/07/12 0452  WBC 1.6* 1.6* 1.5*  NEUTROABS 0.9*  --   --   HGB 8.4* 8.8* 9.0*  HCT 24.3* 25.9*  25.9*  MCV 100.8* 100.4* 101.6*  PLT 84* 74* 74*   Cardiac Enzymes: No results found for this basename: CKTOTAL, CKMB, CKMBINDEX, TROPONINI,  in the last 168 hours BNP: No components found with this basename: POCBNP,  CBG:  Recent Labs Lab 11/06/12 0746 11/06/12 1210 11/06/12 1637 11/06/12 2045 11/07/12 0735  GLUCAP 129* 129* 127* 127* 132*     Micro Results: Recent Results (from the past 240 hour(s))  GRAM STAIN     Status: None   Collection Time    10/28/12 12:40 PM      Result Value Range Status   Specimen  Description CSF   Final   Special Requests Normal   Final   Gram Stain     Final   Value: CYTOSPIN PREP.     NO ORGANISMS SEEN     NO WBC SEEN     Gram Stain Report Called to,Read Back By and Verified With: DEUTSCH,J. AT 1702 ON 10.03.14 BY LOVE,T.   Report Status 10/28/2012 FINAL   Final  FUNGUS CULTURE W SMEAR     Status: None   Collection Time    10/28/12 12:40 PM      Result Value Range Status   Specimen Description CSF   Final   Special Requests NONE   Final   Fungal Smear     Final   Value: NO YEAST OR FUNGAL ELEMENTS SEEN     Performed at [facility]   Culture     Final   Value: CULTURE IN PROGRESS FOR FOUR WEEKS     Performed at [facility]   Report Status PENDING   Incomplete  STOOL CULTURE     Status: None   Collection Time    10/29/12  7:37 AM      Result Value Range Status   Specimen Description STOOL   Final   Special Requests NONE   Final   Culture     Final   Value: NO SALMONELLA, SHIGELLA, CAMPYLOBACTER, YERSINIA, OR E.COLI 0157:H7 ISOLATED     Performed at [facility]   Report Status 11/01/2012 FINAL   Final  CULTURE, BLOOD (ROUTINE X 2)     Status: None   Collection Time    10/29/12 12:00 PM      Result Value Range Status   Specimen Description BLOOD RIGHT ARM  3 ML IN [facility] BOTTLE   Final   Special Requests NONE   Final   Culture  Setup Time     Final   Value: 10/29/2012 19:06     Performed at [facility]   Culture     Final   Value: NO GROWTH 5 DAYS     Performed at [facility]   Report Status 11/04/2012 FINAL   Final  CULTURE, BLOOD (ROUTINE X 2)     Status: None   Collection Time    10/29/12 12:18 PM      Result Value Range Status   Specimen Description BLOOD RIGHT HAND  4 ML IN [facility] BOTTLE   Final   Special Requests NONE   Final   Culture  Setup Time     Final   Value: 10/29/2012 21:55     Performed at [facility]   Culture     Final   Value: NO GROWTH 5 DAYS     Performed at [facility]   Report Status 11/04/2012 FINAL   Final  URINE CULTURE  Status: None   Collection Time    10/30/12  9:39 AM      Result Value Range Status   Specimen Description URINE, CATHETERIZED   Final   Special Requests NONE   Final   Culture  Setup Time     Final   Value: 10/30/2012 21:11     Performed at [facility] Count     Final   Value: NO GROWTH     Performed at [facility]   Culture     Final   Value: NO GROWTH     Performed at [facility]   Report Status 11/01/2012 FINAL   Final  CULTURE, BLOOD (ROUTINE X 2)     Status: None   Collection Time    11/05/12  9:45 AM      Result Value Range Status   Specimen Description BLOOD RIGHT ARM   Final   Special Requests BOTTLES DRAWN AEROBIC AND ANAEROBIC 10CC EACH   Final   Culture  Setup Time     Final   Value: 11/05/2012 20:04     Performed at [facility]   Culture     Final   Value:        BLOOD CULTURE RECEIVED NO GROWTH TO DATE CULTURE WILL BE HELD FOR 5 DAYS BEFORE ISSUING A FINAL NEGATIVE REPORT     Performed at [facility]   Report Status PENDING   Incomplete  CULTURE, BLOOD (ROUTINE X 2)     Status: None   Collection Time    11/05/12 10:00 AM      Result Value Range Status   Specimen Description BLOOD LEFT ARM   Final   Special Requests BOTTLES DRAWN AEROBIC AND ANAEROBIC 10CC EACH   Final   Culture  Setup Time     Final   Value: 11/05/2012 20:05     Performed at [facility]   Culture     Final   Value:        BLOOD CULTURE RECEIVED NO GROWTH TO DATE CULTURE WILL BE HELD FOR 5 DAYS BEFORE ISSUING A FINAL NEGATIVE REPORT     Performed at [facility]   Report Status PENDING   Incomplete    Studies/Results: Dg Chest 2 View  10/28/2012   CLINICAL DATA:  Shortness of Breath.  EXAM: CHEST  2 VIEW  COMPARISON:  09/30/2012  FINDINGS: Left pacer remains in place, unchanged. Prior median sternotomy and CABG. Cardiomegaly with vascular congestion. No  overt edema, effusions or acute bony abnormality.  IMPRESSION: Cardiomegaly, vascular congestion.   Electronically Signed   By: Charlett Nose M.D.   On: 10/28/2012 02:28   Dg Thoracic Spine 2 View  10/28/2012   CLINICAL DATA:  Back pain. No injury.  EXAM: THORACIC SPINE - 2 VIEW  COMPARISON:  Sagittal reconstructed image from a chest CT, 10/28/2012 and lateral chest radiograph, 07/03/2011.  FINDINGS: No fracture or spondylolisthesis.  Mild disk degenerative changes are noted along the mid and lower thoracic spine reflected by small anterior endplate osteophytes. This appearance is stable from the prior lateral chest radiograph. The bones are demineralized.  The surrounding soft tissues are unremarkable.  IMPRESSION: No fracture or spondylolisthesis.  Mild degenerative changes, stable from the prior lateral chest radiograph.   Electronically Signed   By: Amie Portland M.D.   On: 10/28/2012 08:28   Dg Lumbar Spine 2-3 Views  10/28/2012   CLINICAL DATA:  Back  pain  EXAM: LUMBAR SPINE - 2-3 VIEW  COMPARISON:  None.  FINDINGS: No fracture or spondylolisthesis.  Moderate loss of disc height with endplate sclerosis and osteophytes is noted at L1-L2. Remaining lumbar disc spaces are well preserved.  There is a mild curvature of the upper lumbar spine, convex to the right.  The soft tissues are unremarkable.  IMPRESSION: No fracture or acute finding.  Disk degenerative changes at L1-L2.  Mild dextroscoliosis.   Electronically Signed   By: Amie Portland M.D.   On: 10/28/2012 08:25   Ct Head Wo Contrast  10/28/2012   *RADIOLOGY REPORT*  Clinical Data: Confusion  CT HEAD WITHOUT CONTRAST  Technique:  Contiguous axial images were obtained from the base of the skull through the vertex without contrast.  Comparison: Prior CT from 11/10/2008  Findings: There is no acute intracranial hemorrhage or infarct.  No mass or midline shift.  Mild hypo attenuation within the periventricular white matter is similar to prior exam, and  most consistent with chronic small to some ischemic. Calcification along the quadrigeminal plate is unchanged.  CSF containing spaces are stable with asymmetric enlargement of the left lateral ventricle. No extra-axial fluid collection.  Calvarium is intact.  Orbital soft tissues are within normal limits.  Scattered mucosal thickening is seen within the ethmoidal air cells bilaterally.  Mastoid air cells well pneumatized.  IMPRESSION: 1. Stable appearance of the the brain with no acute intra five.  2.  Ethmoidal sinus disease.   Original Report Authenticated By: Rise Mu, M.D.   Ct Angio Chest Pe W/cm &/or Wo Cm  10/28/2012   CLINICAL DATA:  Shortness of breath. Elevated D-dimer.  EXAM: CT ANGIOGRAPHY CHEST  CT ABDOMEN AND PELVIS WITH CONTRAST  TECHNIQUE: Multidetector CT imaging of the chest was performed using the standard protocol during bolus administration of intravenous contrast. Multiplanar CT image reconstructions including MIPs were obtained to evaluate the vascular anatomy. Multidetector CT imaging of the abdomen and pelvis was performed using the standard protocol during bolus administration of intravenous contrast.  CONTRAST:  OMNIPAQUE IOHEXOL 350 MG/ML SOLN  COMPARISON:  PET CT 07/02/2008  FINDINGS: CTA CHEST FINDINGS  No filling defects in the pulmonary arteries to suggest pulmonary emboli. Left-sided pacer is in place. Cardiomegaly. There are pericardial calcifications posteriorly. Scattered coronary artery calcifications. Prior CABG moderate-sized hiatal hernia. No mediastinal, hilar, or axillary adenopathy.  Areas of scarring/ fibrosis peripherally in the upper lobes. No confluent airspace opacities or effusions.  No acute bony abnormality.  CT ABDOMEN and PELVIS FINDINGS  Liver, spleen, pancreas, gallbladder, adrenals are unremarkable. Small bilateral renal cysts. No hydronephrosis.  Stomach and small bowel are decompressed. Right colonic wall appears mildly thickened with  low-density appearance. This can be seen with old inflammatory bowel disease. No active inflammation currently. No free fluid, free air or adenopathy. Aorta and iliac vessels are normal caliber.  Small bilateral inguinal hernias containing fat. Urinary bladder and prostate grossly unremarkable. No acute bony abnormality.  Review of the MIP images confirms the above findings.  IMPRESSION: CTA CHEST IMPRESSION  No evidence of pulmonary embolus.  Cardiomegaly.  CT ABDOMEN and PELVIS IMPRESSION  No acute findings in the abdomen or pelvis.  Fat density within the wall of the right colon may reflect prior inflammatory bowel disease. No active inflammation or acute findings currently.   Electronically Signed   By: Charlett Nose M.D.   On: 10/28/2012 05:05   Ct Abdomen Pelvis W Contrast  10/28/2012   CLINICAL DATA:  Shortness of breath. Elevated D-dimer.  EXAM: CT ANGIOGRAPHY CHEST  CT ABDOMEN AND PELVIS WITH CONTRAST  TECHNIQUE: Multidetector CT imaging of the chest was performed using the standard protocol during bolus administration of intravenous contrast. Multiplanar CT image reconstructions including MIPs were obtained to evaluate the vascular anatomy. Multidetector CT imaging of the abdomen and pelvis was performed using the standard protocol during bolus administration of intravenous contrast.  CONTRAST:  OMNIPAQUE IOHEXOL 350 MG/ML SOLN  COMPARISON:  PET CT 07/02/2008  FINDINGS: CTA CHEST FINDINGS  No filling defects in the pulmonary arteries to suggest pulmonary emboli. Left-sided pacer is in place. Cardiomegaly. There are pericardial calcifications posteriorly. Scattered coronary artery calcifications. Prior CABG moderate-sized hiatal hernia. No mediastinal, hilar, or axillary adenopathy.  Areas of scarring/ fibrosis peripherally in the upper lobes. No confluent airspace opacities or effusions.  No acute bony abnormality.  CT ABDOMEN and PELVIS FINDINGS  Liver, spleen, pancreas, gallbladder, adrenals are  unremarkable. Small bilateral renal cysts. No hydronephrosis.  Stomach and small bowel are decompressed. Right colonic wall appears mildly thickened with low-density appearance. This can be seen with old inflammatory bowel disease. No active inflammation currently. No free fluid, free air or adenopathy. Aorta and iliac vessels are normal caliber.  Small bilateral inguinal hernias containing fat. Urinary bladder and prostate grossly unremarkable. No acute bony abnormality.  Review of the MIP images confirms the above findings.  IMPRESSION: CTA CHEST IMPRESSION  No evidence of pulmonary embolus.  Cardiomegaly.  CT ABDOMEN and PELVIS IMPRESSION  No acute findings in the abdomen or pelvis.  Fat density within the wall of the right colon may reflect prior inflammatory bowel disease. No active inflammation or acute findings currently.   Electronically Signed   By: Charlett Nose M.D.   On: 10/28/2012 05:05   Dg Fluoro Guide Ndl Plc/bx  10/28/2012   CLINICAL DATA:  Altered mental status.  EXAM: DIAGNOSTIC LUMBAR PUNCTURE UNDER FLUOROSCOPIC GUIDANCE  FLUOROSCOPY TIME:  0 min 42 seconds  PROCEDURE: Informed consent was obtained from the patient prior to the procedure, including potential complications of headache, allergy, and pain. With the patient prone, the lower back was prepped with Betadine. 1% Lidocaine was used for local anesthesia. Lumbar puncture was performed at the L3-4 level using a 20 gauge needle with return of clearCSF with an opening pressure of 29 cm water. 15ml of CSF were obtained for laboratory studies. The patient tolerated the procedure well and there were no apparent complications.  IMPRESSION: Successful diagnostic lumbar puncture under fluoroscopic guidance at the level of L3-4. No evidence of immediate complication.   Electronically Signed   By: Myles Rosenthal M.D.   On: 10/28/2012 16:35    Medications: Scheduled Meds: .  ceFAZolin (ANCEF) IV  2 g Intravenous Q8H  . escitalopram  10 mg Oral QHS   . famotidine  20 mg Oral QHS  . finasteride  5 mg Oral q morning - 10a  . fluticasone  2 spray Each Nare Daily  . furosemide  20 mg Oral Daily  . gentamicin irrigation   Irrigation Once  . heparin subcutaneous  5,000 Units Subcutaneous Q8H  . insulin aspart  0-9 Units Subcutaneous TID WC  . loratadine  10 mg Oral Daily  . mometasone-formoterol  2 puff Inhalation BID  . oxybutynin  10 mg Oral q morning - 10a  . pantoprazole  40 mg Oral Daily  . potassium chloride  20 mEq Oral Daily  . sodium chloride  3 mL Intravenous Q12H  .  traZODone  50 mg Oral QHS      LOS: 11 days   Cindy Brindisi M.D. [facility] 11/07/2012, 10:29 AM Pager: [phone]  If 7PM-7AM, please contact night-coverage [email or web address] Password [email or web address]

## 2012-11-07 NOTE — Plan of Care (Signed)
Problem: Discharge Progression Outcomes Goal: Discharge plan in place and appropriate Outcome: Completed/Met Date Met:  11/07/12 Spoke with patient and son and they state family will try to provide 24 hour care so patient can go home.  Patient does not want [facility] for rehab.

## 2012-11-07 NOTE — Progress Notes (Signed)
Peripherally Inserted Central Catheter/Midline Placement  The IV Nurse has discussed with the patient and/or persons authorized to consent for the patient, the purpose of this procedure and the potential benefits and risks involved with this procedure.  The benefits include less needle sticks, lab draws from the catheter and patient may be discharged home with the catheter.  Risks include, but not limited to, infection, bleeding, blood clot (thrombus formation), and puncture of an artery; nerve damage and irregular heat beat.  Alternatives to this procedure were also discussed.  PICC/Midline Placement Documentation        [name] 11/07/2012, 2:25 PM

## 2012-11-07 NOTE — Progress Notes (Signed)
Patient ID: [name], male   DOB: [birth year], 79 y.o.   MRN: [id number] Subjective:  No chest pain or sob  Objective:  Vital Signs in the last 24 hours: Temp:  [97.8 F (36.6 C)-98.5 F (36.9 C)] 98.2 F (36.8 C) (10/13 0500) Pulse Rate:  [69-70] 69 (10/13 0500) Resp:  [18-20] 18 (10/13 0500) BP: (107-129)/(52-100) 125/72 mmHg (10/13 0500) SpO2:  [93 %-95 %] 93 % (10/13 0500) Weight:  [250 lb 8 oz (113.626 kg)] 250 lb 8 oz (113.626 kg) (10/13 0500)  Intake/Output from previous day: 10/12 0701 - 10/13 0700 In: 720 [P.O.:720] Out: 1125 [Urine:1125] Intake/Output from this shift:    Physical Exam: stable appearing NAD HEENT: Unremarkable Neck:  No JVD, no thyromegally Back:  No CVA tenderness Lungs:  Clear with scattered rales. PM extraction is clean and dry. HEART:  Regular rate rhythm, no murmurs, no rubs, no clicks Abd:  Flat, positive bowel sounds, no organomegally, no rebound, no guarding Ext:  2 plus pulses, no edema, no cyanosis, no clubbing Skin:  No rashes no nodules Neuro:  CN II through XII intact, motor grossly intact  Lab Results:  Recent Labs  11/06/12 0525 11/07/12 0452  WBC 1.6* 1.5*  HGB 8.8* 9.0*  PLT 74* 74*    Recent Labs  11/06/12 0525 11/07/12 0452  NA 132* 131*  K 3.9 4.5  CL 95* 95*  CO2 28 29  GLUCOSE 129* 124*  BUN 15 14  CREATININE 0.88 0.86   No results found for this basename: TROPONINI, CK, MB,  in the last 72 hours Hepatic Function Panel No results found for this basename: PROT, ALBUMIN, AST, ALT, ALKPHOS, BILITOT, BILIDIR, IBILI,  in the last 72 hours No results found for this basename: CHOL,  in the last 72 hours No results found for this basename: PROTIME,  in the last 72 hours  Imaging: No results found.  Cardiac Studies: Tele - nsr with ventricular pacing Assessment/Plan:  1. Staph Aureus bacteremia 2. S/p PPM system extraction 3. CHB 4. S/p temporary Perm PM Rec: ok to proceed with supportive care/rehab.  Continue anti-biotics as per the infectious disease service. Would anticipate need for reimplantation of new PM in 6 weeks. He may not undergo MRI. Ok to proceed with rehab.  Gregg Taylor,M.D.  LOS: 11 days    Lewayne Bunting 11/07/2012, 10:48 AM

## 2012-11-08 ENCOUNTER — Encounter: Payer: Self-pay | Admitting: Internal Medicine

## 2012-11-08 ENCOUNTER — Encounter (HOSPITAL_COMMUNITY): Payer: Self-pay | Admitting: Internal Medicine

## 2012-11-08 DIAGNOSIS — I442 Atrioventricular block, complete: Secondary | ICD-10-CM

## 2012-11-08 DIAGNOSIS — Z5189 Encounter for other specified aftercare: Secondary | ICD-10-CM

## 2012-11-08 LAB — BASIC METABOLIC PANEL
CO2: 29 mEq/L (ref 19–32)
Calcium: 9.1 mg/dL (ref 8.4–10.5)
Chloride: 96 mEq/L (ref 96–112)
GFR calc Af Amer: >90 mL/min (ref >90)
GFR calc non Af Amer: 86 mL/min — ABNORMAL LOW (ref >90)
Glucose, Bld: 119 mg/dL — ABNORMAL HIGH (ref 70–99)
Potassium: 3.9 mEq/L (ref 3.5–5.1)
Sodium: 134 mEq/L — ABNORMAL LOW (ref 135–145)

## 2012-11-08 LAB — TYPE AND SCREEN
ABO/RH(D): A POS
Antibody Screen: NEGATIVE
Unit division: 0

## 2012-11-08 LAB — CBC
MCH: 34.7 pg — ABNORMAL HIGH (ref 26.0–34.0)
MCHC: 34 g/dL (ref 30.0–36.0)
Platelets: 81 10*3/uL — ABNORMAL LOW (ref 150–400)
RBC: 2.48 MIL/uL — ABNORMAL LOW (ref 4.22–5.81)

## 2012-11-08 LAB — GLUCOSE, CAPILLARY
Glucose-Capillary: 138 mg/dL — ABNORMAL HIGH (ref 70–99)
Glucose-Capillary: 126 mg/dL — ABNORMAL HIGH (ref 70–99)

## 2012-11-08 MED ORDER — SODIUM CHLORIDE 0.9 % IJ SOLN: 10.0000 mL | INTRAMUSCULAR | Status: DC | PRN

## 2012-11-08 MED ORDER — CEFAZOLIN SODIUM-DEXTROSE 2-3 GM-% IV SOLR: 2.0000 g | Freq: Three times a day (TID) | INTRAVENOUS | Status: DC

## 2012-11-08 MED ORDER — HEPARIN SOD (PORK) LOCK FLUSH 100 UNIT/ML IV SOLN
250.0000 [IU] | INTRAVENOUS | Status: AC | PRN
  Administered 2012-11-08: 250 [IU]

## 2012-11-08 NOTE — Discharge Summary (Signed)
Physician Discharge Summary  Patient ID: [name] MRN: [id number] DOB/AGE: [birth year] 79 y.o.  Admit date: 10/27/2012 Discharge date: 11/08/2012  Primary Care Physician:  Sandrea Hughs, MD   Final Discharge Diagnoses:   Staph aureus bacteremia/sepsis of unclear etiology, likely with assumption that the pacemaker is contaminated  Secondary diagnosis . acute encephalopathy . Abdominal pain . Bacteremia due to Staphylococcus aureus . generalized debility  . Other pancytopenia . Hyponatremia . LYMPHOMA NEC, MLIG, INGUINAL/LOWER LIMB . Essential hypertension, benign . GERD . DM (diabetes mellitus), secondary uncontrolled  Consults: Cardiology: Dr Ladona Ridgel                   Infectious disease  Recommendations for Outpatient Follow-up:  1. CBC and CMET weekly, fax results to [facility] office, Dr Luciana Axe 2. pacemaker was removed on 11/04/2012, temporary pacemaker inserted  Allergies:  No Known Allergies   Discharge Medications:   Medication List         albuterol 108 (90 BASE) MCG/ACT inhaler  Commonly known as:  PROVENTIL HFA;VENTOLIN HFA  Inhale 2 puffs into the lungs every 4 (four) hours as needed for wheezing or shortness of breath.     aspirin 81 MG tablet  Take 81 mg by mouth every morning. (hold if bleeding)     ceFAZolin 2-3 GM-% Solr  Commonly known as:  ANCEF  Inject 50 mLs (2 g total) into the vein every 8 (eight) hours. For 6 weeks, STOP DATE 12/16/12     cetirizine 10 MG tablet  Commonly known as:  ZYRTEC  Take 10 mg by mouth at bedtime as needed for allergies.     CITRUCEL oral powder  Generic drug:  methylcellulose  1 scoop in 8oz of water at bedtime     escitalopram 20 MG tablet  Commonly known as:  LEXAPRO  Take 10 mg by mouth at bedtime.     famotidine 20 MG tablet  Commonly known as:  PEPCID  Take 20 mg by mouth at bedtime.     finasteride 5 MG tablet  Commonly known as:  PROSCAR  Take 5 mg by mouth every morning.     furosemide 20 MG  tablet  Commonly known as:  LASIX  Take 20 mg by mouth daily. May take 1 extra daily if needed for swelling in legs     hydrocortisone cream 1 %  Apply 1 application topically 2 (two) times daily as needed (itchy rash).     metFORMIN 500 MG tablet  Commonly known as:  GLUCOPHAGE  Take 500 mg by mouth 2 (two) times daily with a meal.     MIRALAX powder  Generic drug:  polyethylene glycol powder  Take 17 g by mouth at bedtime.     mometasone 50 MCG/ACT nasal spray  Commonly known as:  NASONEX  Place 2 sprays into the nose 2 (two) times daily.     mometasone-formoterol 200-5 MCG/ACT Aero  Commonly known as:  DULERA  Inhale 2 puffs into the lungs 2 (two) times daily.     MUCINEX DM 30-600 MG per 12 hr tablet  Generic drug:  dextromethorphan-guaiFENesin  Take 1 tablet by mouth every 12 (twelve) hours as needed (cough).     multivitamin tablet  Take 1 tablet by mouth daily.     omeprazole 20 MG capsule  Commonly known as:  PRILOSEC  Take 40 mg by mouth daily before breakfast.     OS-CAL 500 + D 500-200 MG-UNIT per tablet  Generic drug:  calcium-vitamin D  Take 1 tablet by mouth 2 (two) times daily.     oxybutynin 5 MG tablet  Commonly known as:  DITROPAN  Take 10 mg by mouth every morning.     potassium chloride SA 20 MEQ tablet  Commonly known as:  K-DUR,KLOR-CON  Take 20 mEq by mouth daily.     sodium chloride 0.9 % injection  10-40 mLs by Intracatheter route as needed (flush).     sulindac 200 MG tablet  Commonly known as:  CLINORIL  Take 200 mg by mouth 2 (two) times daily as needed (pain).     traMADol 50 MG tablet  Commonly known as:  ULTRAM  Take 50-100 mg by mouth every 6 (six) hours as needed for pain (cough).     traZODone 50 MG tablet  Commonly known as:  DESYREL  Take 50 mg by mouth at bedtime.     TYLENOL 500 MG tablet  Generic drug:  acetaminophen  Take 500-1,000 mg by mouth every 6 (six) hours as needed.     Vitamin D 1000 UNITS capsule  Take  1,000 Units by mouth 2 (two) times daily.         Brief H and P: For complete details please refer to admission H and P, but in brief [name] is a 79 y.o. male with history of lymphoma in remission and pancytopenia, diabetes mellitus type 2 presented to the ER because of increasing weakness. Patient stated that a day before the admission, patient had started feeling increasingly weak and difficulty to walk. He had been having lower abdominal pain for last 3 weeks with back pain. Over the last one week prior to admission, patient was also having mild frontal headache. Patient had periods of confusion. Patient denied any nausea vomiting diarrhea fever chills chest pain or shortness of breath prior to admission. He denied any incontinence of urine or bowel. In the ER patient was found to be mildly febrile. UA was unremarkable. Chest x-ray did not show any infiltrates. While examining, patient became mildly short of breath. Lactic acid levels are found to be high. ABG is unremarkable otherwise. Patient was admitted for  further management. Patient was transferred to [facility] on 11/03/2012 prior pacemaker removal when he was found to have MSSA bacteremia with the assumption that pacemaker was also contaminated.   Hospital Course:  MSSA bacteremia/pacemaker infection/sepsis: Patient was initially admitted at [facility] and was found to have blood cultures positive for MSSA. CT of the abdomen and pelvis was negative. Cardiology was consulted, TEE was done on 10/31/2012 which was negative for any vegetations. Patient was transferred to [facility] for pacemaker removal with the assumption that the pacemaker was also contaminated due to MSSA bacteremia.  Pacemaker was removed on 10/10 and temporary pacemaker was inserted. He has pacemaker wound check on 11/17/2012 and appointment with Dr. Ladona Ridgel on 12/06/2012 regarding possibility of permanent pacemaker patient at the  time. Infectious disease was consulted and followed throughout the hospitalization. Repeat blood cultures done on 10/11 are negative so far. PICC line was placed on 10/13. Per infectious disease, Dr. Luciana Axe, continue IV cefazolin for 6 weeks through 12/16/2012. Home health physical therapy and home health nurse was arranged. Patient was also evaluated by inpatient rehabilitation/ CIR and was declined as patient was doing well.   Back pain: Patient did complain of low back pain on 11/07/2012 hence due to concern for an infection in the spine, CT of the lumbar  spine was obtained which showed no signs of disc OM or abscess.   Acute encephalopathy : Likely due to sepsis and acute infection, clinically improved. CT head was negative. Likely secondary to problem #1. Patient is currently back to his baseline mental status.  Pancytopenia - Relatively chronic in nature and secondary to myelodysplastic syndrome.  - Patient's white count is essentially at baseline. Patient has been seen by oncology and feel patient's pancytopenia is secondary to myelodysplastic syndrome. No need for any further transfusion or growth factor support per oncology. He has an appointment with Dr Clelia Croft on 11/14.   abdominal pain : Resolved  CT abdomen and pelvis negative. Tolerating solids without any difficulty  Chronic grade 2 diastolic dysfunction  - TEE on 10/31/2012 showed EF of 55-60%, normal wall motion, no aortic or mitral valve vegetation  - Continue Lasix   follicular lymphoma grade 3 diagnosed 2007  In remission per oncology. Followup as outpatient.    Day of Discharge BP 134/71  Pulse 70  Temp(Src) 98.6 F (37 C) (Oral)  Resp 18  Ht 5\' 11"  (1.803 m)  Wt 111.63 kg (246 lb 1.6 oz)  BMI 34.34 kg/m2  SpO2 97%  Physical Exam:  General:NAD, alert and oriented x3  CVS: S1-S2 clear,  Chest: CTAB, pacemaker site clean  Abdomen: soft NT, ND, NBS  Extremities: no c/c. Trace bilaterally   The results of  significant diagnostics from this hospitalization (including imaging, microbiology, ancillary and laboratory) are listed below for reference.    LAB RESULTS: Basic Metabolic Panel:  Recent Labs Lab 11/07/12 0452 11/08/12 0540  NA 131* 134*  K 4.5 3.9  CL 95* 96  CO2 29 29  GLUCOSE 124* 119*  BUN 14 13  CREATININE 0.86 0.75  CALCIUM 8.8 9.1   Liver Function Tests:  Recent Labs Lab 11/04/12 0550  AST 24  ALT 23  ALKPHOS 95  BILITOT 0.8  PROT 6.4  ALBUMIN 3.2*   No results found for this basename: LIPASE, AMYLASE,  in the last 168 hours No results found for this basename: AMMONIA,  in the last 168 hours CBC:  Recent Labs Lab 11/04/12 0550  11/07/12 0452 11/08/12 0540  WBC 1.6*  < > 1.5* 1.3*  NEUTROABS 0.9*  --   --   --   HGB 8.4*  < > 9.0* 8.6*  HCT 24.3*  < > 25.9* 25.3*  MCV 100.8*  < > 101.6* 102.0*  PLT 84*  < > 74* 81*  < > = values in this interval not displayed. Cardiac Enzymes: No results found for this basename: CKTOTAL, CKMB, CKMBINDEX, TROPONINI,  in the last 168 hours BNP: No components found with this basename: POCBNP,  CBG:  Recent Labs Lab 11/07/12 2058 11/08/12 0726  GLUCAP 140* 138*    Significant Diagnostic Studies:  Dg Chest 2 View  10/28/2012   CLINICAL DATA:  Shortness of Breath.  EXAM: CHEST  2 VIEW  COMPARISON:  09/30/2012  FINDINGS: Left pacer remains in place, unchanged. Prior median sternotomy and CABG. Cardiomegaly with vascular congestion. No overt edema, effusions or acute bony abnormality.  IMPRESSION: Cardiomegaly, vascular congestion.   Electronically Signed   By: Charlett Nose M.D.   On: 10/28/2012 02:28   Dg Thoracic Spine 2 View  10/28/2012   CLINICAL DATA:  Back pain. No injury.  EXAM: THORACIC SPINE - 2 VIEW  COMPARISON:  Sagittal reconstructed image from a chest CT, 10/28/2012 and lateral chest radiograph, 07/03/2011.  FINDINGS: No fracture or  spondylolisthesis.  Mild disk degenerative changes are noted along the mid  and lower thoracic spine reflected by small anterior endplate osteophytes. This appearance is stable from the prior lateral chest radiograph. The bones are demineralized.  The surrounding soft tissues are unremarkable.  IMPRESSION: No fracture or spondylolisthesis.  Mild degenerative changes, stable from the prior lateral chest radiograph.   Electronically Signed   By: Amie Portland M.D.   On: 10/28/2012 08:28   Dg Lumbar Spine 2-3 Views  10/28/2012   CLINICAL DATA:  Back pain  EXAM: LUMBAR SPINE - 2-3 VIEW  COMPARISON:  None.  FINDINGS: No fracture or spondylolisthesis.  Moderate loss of disc height with endplate sclerosis and osteophytes is noted at L1-L2. Remaining lumbar disc spaces are well preserved.  There is a mild curvature of the upper lumbar spine, convex to the right.  The soft tissues are unremarkable.  IMPRESSION: No fracture or acute finding.  Disk degenerative changes at L1-L2.  Mild dextroscoliosis.   Electronically Signed   By: Amie Portland M.D.   On: 10/28/2012 08:25   Ct Head Wo Contrast  10/28/2012   *RADIOLOGY REPORT*  Clinical Data: Confusion  CT HEAD WITHOUT CONTRAST  Technique:  Contiguous axial images were obtained from the base of the skull through the vertex without contrast.  Comparison: Prior CT from 11/10/2008  Findings: There is no acute intracranial hemorrhage or infarct.  No mass or midline shift.  Mild hypo attenuation within the periventricular white matter is similar to prior exam, and most consistent with chronic small to some ischemic. Calcification along the quadrigeminal plate is unchanged.  CSF containing spaces are stable with asymmetric enlargement of the left lateral ventricle. No extra-axial fluid collection.  Calvarium is intact.  Orbital soft tissues are within normal limits.  Scattered mucosal thickening is seen within the ethmoidal air cells bilaterally.  Mastoid air cells well pneumatized.  IMPRESSION: 1. Stable appearance of the the brain with no acute intra  five.  2.  Ethmoidal sinus disease.   Original Report Authenticated By: Rise Mu, M.D.   Ct Angio Chest Pe W/cm &/or Wo Cm  10/28/2012   CLINICAL DATA:  Shortness of breath. Elevated D-dimer.  EXAM: CT ANGIOGRAPHY CHEST  CT ABDOMEN AND PELVIS WITH CONTRAST  TECHNIQUE: Multidetector CT imaging of the chest was performed using the standard protocol during bolus administration of intravenous contrast. Multiplanar CT image reconstructions including MIPs were obtained to evaluate the vascular anatomy. Multidetector CT imaging of the abdomen and pelvis was performed using the standard protocol during bolus administration of intravenous contrast.  CONTRAST:  OMNIPAQUE IOHEXOL 350 MG/ML SOLN  COMPARISON:  PET CT 07/02/2008  FINDINGS: CTA CHEST FINDINGS  No filling defects in the pulmonary arteries to suggest pulmonary emboli. Left-sided pacer is in place. Cardiomegaly. There are pericardial calcifications posteriorly. Scattered coronary artery calcifications. Prior CABG moderate-sized hiatal hernia. No mediastinal, hilar, or axillary adenopathy.  Areas of scarring/ fibrosis peripherally in the upper lobes. No confluent airspace opacities or effusions.  No acute bony abnormality.  CT ABDOMEN and PELVIS FINDINGS  Liver, spleen, pancreas, gallbladder, adrenals are unremarkable. Small bilateral renal cysts. No hydronephrosis.  Stomach and small bowel are decompressed. Right colonic wall appears mildly thickened with low-density appearance. This can be seen with old inflammatory bowel disease. No active inflammation currently. No free fluid, free air or adenopathy. Aorta and iliac vessels are normal caliber.  Small bilateral inguinal hernias containing fat. Urinary bladder and prostate grossly unremarkable. No acute bony abnormality.  Review of the MIP images confirms the above findings.  IMPRESSION: CTA CHEST IMPRESSION  No evidence of pulmonary embolus.  Cardiomegaly.  CT ABDOMEN and PELVIS IMPRESSION  No  acute findings in the abdomen or pelvis.  Fat density within the wall of the right colon may reflect prior inflammatory bowel disease. No active inflammation or acute findings currently.   Electronically Signed   By: Charlett Nose M.D.   On: 10/28/2012 05:05   Ct Abdomen Pelvis W Contrast  10/28/2012   CLINICAL DATA:  Shortness of breath. Elevated D-dimer.  EXAM: CT ANGIOGRAPHY CHEST  CT ABDOMEN AND PELVIS WITH CONTRAST  TECHNIQUE: Multidetector CT imaging of the chest was performed using the standard protocol during bolus administration of intravenous contrast. Multiplanar CT image reconstructions including MIPs were obtained to evaluate the vascular anatomy. Multidetector CT imaging of the abdomen and pelvis was performed using the standard protocol during bolus administration of intravenous contrast.  CONTRAST:  OMNIPAQUE IOHEXOL 350 MG/ML SOLN  COMPARISON:  PET CT 07/02/2008  FINDINGS: CTA CHEST FINDINGS  No filling defects in the pulmonary arteries to suggest pulmonary emboli. Left-sided pacer is in place. Cardiomegaly. There are pericardial calcifications posteriorly. Scattered coronary artery calcifications. Prior CABG moderate-sized hiatal hernia. No mediastinal, hilar, or axillary adenopathy.  Areas of scarring/ fibrosis peripherally in the upper lobes. No confluent airspace opacities or effusions.  No acute bony abnormality.  CT ABDOMEN and PELVIS FINDINGS  Liver, spleen, pancreas, gallbladder, adrenals are unremarkable. Small bilateral renal cysts. No hydronephrosis.  Stomach and small bowel are decompressed. Right colonic wall appears mildly thickened with low-density appearance. This can be seen with old inflammatory bowel disease. No active inflammation currently. No free fluid, free air or adenopathy. Aorta and iliac vessels are normal caliber.  Small bilateral inguinal hernias containing fat. Urinary bladder and prostate grossly unremarkable. No acute bony abnormality.  Review of the MIP  images confirms the above findings.  IMPRESSION: CTA CHEST IMPRESSION  No evidence of pulmonary embolus.  Cardiomegaly.  CT ABDOMEN and PELVIS IMPRESSION  No acute findings in the abdomen or pelvis.  Fat density within the wall of the right colon may reflect prior inflammatory bowel disease. No active inflammation or acute findings currently.   Electronically Signed   By: Charlett Nose M.D.   On: 10/28/2012 05:05   Dg Fluoro Guide Ndl Plc/bx  10/28/2012   CLINICAL DATA:  Altered mental status.  EXAM: DIAGNOSTIC LUMBAR PUNCTURE UNDER FLUOROSCOPIC GUIDANCE  FLUOROSCOPY TIME:  0 min 42 seconds  PROCEDURE: Informed consent was obtained from the patient prior to the procedure, including potential complications of headache, allergy, and pain. With the patient prone, the lower back was prepped with Betadine. 1% Lidocaine was used for local anesthesia. Lumbar puncture was performed at the L3-4 level using a 20 gauge needle with return of clearCSF with an opening pressure of 29 cm water. 15ml of CSF were obtained for laboratory studies. The patient tolerated the procedure well and there were no apparent complications.  IMPRESSION: Successful diagnostic lumbar puncture under fluoroscopic guidance at the level of L3-4. No evidence of immediate complication.   Electronically Signed   By: Myles Rosenthal M.D.   On: 10/28/2012 16:35    TEE 10/31/12  Study Conclusions  - Left ventricle: Systolic function was normal. The estimated ejection fraction was in the range of 55% to 60%. Wall motion was normal; there were no regional wall motion abnormalities. - Aortic valve: No evidence of vegetation. Trivial regurgitation. - Mitral  valve: No evidence of vegetation. No evidence of vegetation. Mild regurgitation. - Left atrium: The atrium was mildly dilated. - Right atrium: The atrium was mildly dilated. - Atrial septum: No defect or patent foramen ovale was identified. There was an atrial septal aneurysm. - Tricuspid  valve: No evidence of vegetation. Severe regurgitation. - Pulmonic valve: No evidence of vegetation. Impressions:  - Vegetation is absent on valves/ pacer wire.   Disposition and Follow-up:     Discharge Orders   Future Appointments Provider Department Dept Phone   11/11/2012 1:00 PM [facility] Only [facility] [phone]   11/11/2012 1:45 PM [facility] Inj Nurse [facility] [phone]   11/17/2012 10:00 AM [facility] [facility] [phone]   11/22/2012 2:45 PM Judyann Munson, MD [facility] for Infectious Disease [phone]   11/24/2012 1:30 PM Nyoka Cowden, MD [facility] [phone]   11/25/2012 1:15 PM Dava Najjar Idelle Jo [facility] [phone]   11/25/2012 1:45 PM [facility] Inj Nurse [place] [phone]   12/06/2012 10:30 AM Marinus Maw, MD [facility] [place] Office [phone]   12/09/2012 1:00 PM Dava Najjar Idelle Jo [facility] [phone]   12/09/2012 1:30 PM Benjiman Core, MD [facility] [phone]   12/09/2012 2:15 PM [facility] Inj Nurse [facility] [phone]   Future Orders Complete By Expires   Diet Carb Modified  As directed    Increase activity slowly  As directed        DISPOSITION: Home  DIET: Carb modified  ACTIVITY: As tolerated  TESTS THAT NEED FOLLOW-UP CBC, CMET weekly  DISCHARGE FOLLOW-UP Follow-up Information   Follow up with Judyann Munson, MD On 11/22/2012. (infectious disease follow-up AT 2:45 pm)    Specialty:  Infectious Diseases   Contact information:   [address] [place] [address] [phone]       Follow up with Sandrea Hughs, MD On 11/24/2012. (at 1:30PM)    Specialty:  Pulmonary Disease   Contact information:    [address] [place] [address] [phone]       Follow up with Lewayne Bunting, MD On 11/29/2012. (at 10: 30AM for permanent pacemaker discussion)    Specialty:  Cardiology   Contact information:   [address] [place] [address] [phone]       Follow up with [facility] On 11/17/2012. (at 10:00 am for pacemaker wound check)    Contact information:   [address] [place] [address]       Time spent on Discharge: 45 mins  Signed:   Sirenia Whitis M.D. [facility] 11/08/2012, 10:59 AM Pager: [phone]

## 2012-11-08 NOTE — Progress Notes (Signed)
SUBJECTIVE: The patient is doing well today.  At this time, he denies chest pain, shortness of breath, or any new concerns.  Pending discharge to home with PT today.   CURRENT MEDICATIONS: .  ceFAZolin (ANCEF) IV  2 g Intravenous Q8H  . escitalopram  10 mg Oral QHS  . famotidine  20 mg Oral QHS  . finasteride  5 mg Oral q morning - 10a  . fluticasone  2 spray Each Nare Daily  . furosemide  20 mg Oral Daily  . gentamicin irrigation   Irrigation Once  . heparin subcutaneous  5,000 Units Subcutaneous Q8H  . insulin aspart  0-9 Units Subcutaneous TID WC  . loratadine  10 mg Oral Daily  . mometasone-formoterol  2 puff Inhalation BID  . oxybutynin  10 mg Oral q morning - 10a  . pantoprazole  40 mg Oral Daily  . potassium chloride  20 mEq Oral Daily  . sodium chloride  3 mL Intravenous Q12H  . traZODone  50 mg Oral QHS      OBJECTIVE: Physical Exam: Filed Vitals:   11/07/12 1400 11/07/12 2056 11/07/12 2103 11/08/12 0544  BP: 132/74 121/51  134/71  Pulse: 72 68  70  Temp: 98.2 F (36.8 C) 99.2 F (37.3 C)  98.6 F (37 C)  TempSrc: Oral Oral  Oral  Resp: 19 18  18   Height:      Weight:    246 lb 1.6 oz (111.63 kg)  SpO2: 95% 96% 97% 97%    Intake/Output Summary (Last 24 hours) at 11/08/12 0749 Last data filed at 11/08/12 0500  Gross per 24 hour  Intake    960 ml  Output   3150 ml  Net  -2190 ml    Telemetry reveals sinus rhythm with AV dissociated ventricular pacing  GEN- The patient is well appearing, alert and oriented x 3 today.   Head- normocephalic, atraumatic Eyes-  Sclera clear, conjunctiva pink Ears- hearing intact Oropharynx- clear Neck- supple, no JVP Lymph- no cervical lymphadenopathy Lungs- Clear to ausculation bilaterally, normal work of breathing Heart- Regular rate and rhythm, no murmurs, rubs or gallops, PMI not laterally displaced GI- soft, NT, ND, + BS Extremities- no clubbing, cyanosis, or edema Temporary pacer dressing in place,    LABS: Basic Metabolic Panel:  Recent Labs  40/98/11 0452 11/08/12 0540  NA 131* 134*  K 4.5 3.9  CL 95* 96  CO2 29 29  GLUCOSE 124* 119*  BUN 14 13  CREATININE 0.86 0.75  CALCIUM 8.8 9.1   CBC:  Recent Labs  11/07/12 0452 11/08/12 0540  WBC 1.5* 1.3*  HGB 9.0* 8.6*  HCT 25.9* 25.3*  MCV 101.6* 102.0*  PLT 74* 81*    RADIOLOGY: Dg Chest 2 View 11/05/2012   CLINICAL DATA:  Extraction of old pacer.  EXAM: CHEST  2 VIEW  COMPARISON:  10/28/2012  FINDINGS: There is a new pacemaker in the left chest. Patient now has a single cardiac lead that extends into the region of the right ventricle. There is no evidence for a pneumothorax. Again noted is enlargement of the cardiac silhouette. No significant airspace disease and no evidence for pleural effusions.  IMPRESSION: Placement of new cardiac pacemaker. No evidence for pneumothorax.  Cardiomegaly.   Electronically Signed   By: Richarda Overlie M.D.   On: 11/05/2012 07:46   ASSESSMENT AND PLAN:  Principal Problem:   Bacteremia due to Staphylococcus aureus Active Problems:   LYMPHOMA NEC, MLIG, INGUINAL/LOWER LIMB  Essential hypertension, benign   GERD   DM (diabetes mellitus), secondary uncontrolled   Weakness   Abdominal pain   Acute encephalopathy   Other pancytopenia   Hyponatremia   Sepsis   Pacemaker infection  1. Staph Aureus bacteremia  2. S/p PPM system extraction  3. CHB  4. S/p temporary Perm PM   Per Dr Ladona Ridgel, ok to proceed with supportive care/rehab. Continue anti-biotics as per the infectious disease service. Would anticipate need for reimplantation of new PM in 6 weeks. He may not undergo MRI. Ok to proceed with rehab.   Follow up appointment scheduled with [facility] for wound check 11-17-12 at 10AM.  Follow up with Dr Ladona Ridgel to discuss timing of pacemaker re-implant 12-06-12 at 10:30 AM.

## 2012-11-11 ENCOUNTER — Telehealth: Payer: Self-pay | Admitting: Internal Medicine

## 2012-11-11 ENCOUNTER — Other Ambulatory Visit (HOSPITAL_BASED_OUTPATIENT_CLINIC_OR_DEPARTMENT_OTHER): Payer: Medicare Other

## 2012-11-11 ENCOUNTER — Ambulatory Visit (HOSPITAL_BASED_OUTPATIENT_CLINIC_OR_DEPARTMENT_OTHER): Payer: Medicare Other

## 2012-11-11 VITALS — BP 124/72 | HR 70 | Temp 98.4°F

## 2012-11-11 DIAGNOSIS — K469 Unspecified abdominal hernia without obstruction or gangrene: Secondary | ICD-10-CM

## 2012-11-11 DIAGNOSIS — D469 Myelodysplastic syndrome, unspecified: Secondary | ICD-10-CM

## 2012-11-11 DIAGNOSIS — D649 Anemia, unspecified: Secondary | ICD-10-CM

## 2012-11-11 LAB — CBC WITH DIFFERENTIAL/PLATELET
BASO%: 0.5 % (ref 0.0–2.0)
EOS%: 1.6 % (ref 0.0–7.0)
HCT: 26.2 % — ABNORMAL LOW (ref 38.4–49.9)
MCH: 33.6 pg — ABNORMAL HIGH (ref 27.2–33.4)
MCHC: 32.8 g/dL (ref 32.0–36.0)
MONO#: 0 10*3/uL — ABNORMAL LOW (ref 0.1–0.9)
NEUT%: 44.9 % (ref 39.0–75.0)
RBC: 2.56 10*6/uL — ABNORMAL LOW (ref 4.20–5.82)
RDW: 19.7 % — ABNORMAL HIGH (ref 11.0–14.6)
WBC: 1.8 10*3/uL — ABNORMAL LOW (ref 4.0–10.3)
lymph#: 1 10*3/uL (ref 0.9–3.3)
nRBC: 0 % (ref 0–0)

## 2012-11-11 LAB — CULTURE, BLOOD (ROUTINE X 2): Culture: NO GROWTH

## 2012-11-11 MED ORDER — DARBEPOETIN ALFA-POLYSORBATE 300 MCG/0.6ML IJ SOLN
300.0000 ug | Freq: Once | INTRAMUSCULAR | Status: AC
  Administered 2012-11-11: 300 ug via SUBCUTANEOUS
  Filled 2012-11-11: qty 0.6

## 2012-11-11 NOTE — Telephone Encounter (Signed)
Number still busy

## 2012-11-11 NOTE — Telephone Encounter (Signed)
Number is busy Will continue to try  Dr Ladona Ridgel says SOB is expected

## 2012-11-11 NOTE — Telephone Encounter (Signed)
New problem    When walking his heart rate goes down  From 70-59 range. Some sob.

## 2012-11-14 NOTE — Telephone Encounter (Signed)
I have tried the number given again today and it remains busy

## 2012-11-17 ENCOUNTER — Ambulatory Visit (INDEPENDENT_AMBULATORY_CARE_PROVIDER_SITE_OTHER): Payer: Medicare Other | Admitting: *Deleted

## 2012-11-17 ENCOUNTER — Ambulatory Visit (HOSPITAL_COMMUNITY)
Admission: RE | Admit: 2012-11-17 | Discharge: 2012-11-17 | Disposition: A | Discharge status: Home / Self Care | Payer: Medicare Other | Source: Ambulatory Visit | Attending: Oncology | Admitting: Oncology

## 2012-11-17 ENCOUNTER — Other Ambulatory Visit: Payer: Self-pay | Admitting: Oncology

## 2012-11-17 ENCOUNTER — Other Ambulatory Visit (HOSPITAL_BASED_OUTPATIENT_CLINIC_OR_DEPARTMENT_OTHER): Payer: Medicare Other | Admitting: Lab

## 2012-11-17 DIAGNOSIS — D72819 Decreased white blood cell count, unspecified: Secondary | ICD-10-CM

## 2012-11-17 DIAGNOSIS — D649 Anemia, unspecified: Secondary | ICD-10-CM

## 2012-11-17 DIAGNOSIS — D539 Nutritional anemia, unspecified: Secondary | ICD-10-CM

## 2012-11-17 DIAGNOSIS — I442 Atrioventricular block, complete: Secondary | ICD-10-CM

## 2012-11-17 LAB — CBC WITH DIFFERENTIAL/PLATELET
Basophils Absolute: 0 10*3/uL (ref 0.0–0.1)
EOS%: 1 % (ref 0.0–7.0)
Eosinophils Absolute: 0 10*3/uL (ref 0.0–0.5)
HGB: 8.2 g/dL — ABNORMAL LOW (ref 13.0–17.1)
MCV: 104.2 fL — ABNORMAL HIGH (ref 79.3–98.0)
MONO%: 2 % (ref 0.0–14.0)
NEUT#: 0.8 10*3/uL — ABNORMAL LOW (ref 1.5–6.5)
NEUT%: 52.9 % (ref 39.0–75.0)
RBC: 2.35 10*6/uL — ABNORMAL LOW (ref 4.20–5.82)
RDW: 22.8 % — ABNORMAL HIGH (ref 11.0–14.6)
lymph#: 0.6 10*3/uL — ABNORMAL LOW (ref 0.9–3.3)

## 2012-11-17 LAB — HOLD TUBE, BLOOD BANK

## 2012-11-17 NOTE — Progress Notes (Signed)
Pt seen in device clinic for follow up of recently removed pacemaker w/ external temp pacer.  1 suture removed.  Device interrogated and found to be functioning normally.  No changes made today. See PaceArt for full details.  Pt has dyspnea very quickly upon exertion  Pt to follow up with Dr. Ladona Ridgel 12/06/12.   [place] Pulse 11/17/2012 11:17 AM

## 2012-11-18 ENCOUNTER — Ambulatory Visit (HOSPITAL_BASED_OUTPATIENT_CLINIC_OR_DEPARTMENT_OTHER): Payer: Medicare Other

## 2012-11-18 VITALS — BP 132/82 | HR 69 | Temp 97.7°F | Resp 20

## 2012-11-18 DIAGNOSIS — D539 Nutritional anemia, unspecified: Secondary | ICD-10-CM

## 2012-11-18 DIAGNOSIS — D649 Anemia, unspecified: Secondary | ICD-10-CM

## 2012-11-18 LAB — PACEMAKER DEVICE OBSERVATION
BATTERY VOLTAGE: 2.78 V
BRDY-0002RV: 70 {beats}/min
RV LEAD IMPEDENCE PM: 746 Ohm

## 2012-11-18 LAB — PREPARE RBC (CROSSMATCH)

## 2012-11-18 MED ORDER — ACETAMINOPHEN 325 MG PO TABS
650.0000 mg | ORAL_TABLET | Freq: Once | ORAL | Status: AC
  Administered 2012-11-18: 650 mg via ORAL

## 2012-11-18 MED ORDER — SODIUM CHLORIDE 0.9 % IV SOLN
250.0000 mL | Freq: Once | INTRAVENOUS | Status: AC
  Administered 2012-11-18: 250 mL via INTRAVENOUS

## 2012-11-18 MED ORDER — ACETAMINOPHEN 325 MG PO TABS
ORAL_TABLET | ORAL | Status: AC
  Filled 2012-11-18: qty 2

## 2012-11-18 MED ORDER — DIPHENHYDRAMINE HCL 25 MG PO CAPS
25.0000 mg | ORAL_CAPSULE | Freq: Once | ORAL | Status: AC
  Administered 2012-11-18: 25 mg via ORAL

## 2012-11-18 MED ORDER — DIPHENHYDRAMINE HCL 25 MG PO CAPS
ORAL_CAPSULE | ORAL | Status: AC
  Filled 2012-11-18: qty 1

## 2012-11-18 NOTE — Patient Instructions (Signed)
Blood Transfusion  A blood transfusion replaces your blood or some of its parts. Blood is replaced when you have lost blood because of surgery, an accident, or for severe blood conditions like anemia. You can donate blood to be used on yourself if you have a planned surgery. If you lose blood during that surgery, your own blood can be given back to you. Any blood given to you is checked to make sure it matches your blood type. Your temperature, blood pressure, and heart rate (vital signs) will be checked often.  GET HELP RIGHT AWAY IF:   You feel sick to your stomach (nauseous) or throw up (vomit).  You have watery poop (diarrhea).  You have shortness of breath or trouble breathing.  You have blood in your pee (urine) or have dark colored pee.  You have chest pain or tightness.  Your eyes or skin turn yellow (jaundice).  You have a temperature by mouth above 102 F (38.9 C), not controlled by medicine.  You start to shake and have chills.  You develop a a red rash (hives) or feel itchy.  You develop lightheadedness or feel confused.  You develop back, joint, or muscle pain.  You do not feel hungry (lost appetite).  You feel tired, restless, or nervous.  You develop belly (abdominal) cramps. Document Released: 04/10/2008 Document Revised: 04/06/2011 Document Reviewed: 04/10/2008 [employer] Patient Information 2014 [employer].  

## 2012-11-21 LAB — TYPE AND SCREEN
ABO/RH(D): A POS
Antibody Screen: NEGATIVE
Unit division: 0

## 2012-11-22 ENCOUNTER — Ambulatory Visit (INDEPENDENT_AMBULATORY_CARE_PROVIDER_SITE_OTHER): Payer: Medicare Other | Admitting: Internal Medicine

## 2012-11-22 ENCOUNTER — Encounter: Payer: Self-pay | Admitting: Internal Medicine

## 2012-11-22 VITALS — BP 131/75 | HR 70 | Temp 97.9°F | Wt 252.0 lb

## 2012-11-22 DIAGNOSIS — A4901 Methicillin susceptible Staphylococcus aureus infection, unspecified site: Secondary | ICD-10-CM

## 2012-11-22 DIAGNOSIS — T827XXS Infection and inflammatory reaction due to other cardiac and vascular devices, implants and grafts, sequela: Secondary | ICD-10-CM

## 2012-11-22 DIAGNOSIS — T889XXS Complication of surgical and medical care, unspecified, sequela: Secondary | ICD-10-CM

## 2012-11-22 NOTE — Progress Notes (Signed)
[facility] NOTE  RFV: follow up for MSSA cardiac device infection/bacteremia Subjective:    Patient ID: [name], male    DOB: October 10, 1932, 79 y.o.   MRN: [id number]  HPI 79yo M with hx of lymphoma 2007, pancytopenia, DM 2, who had MSSA bacteremia/cardiac device infection which has been subsequently removed. He was discharged on 6 wks of IV cefazoli to finish on 12/16/12. He has an external pacemaker presently and followed by Dr. Ladona Ridgel to discuss new implant likely at end of 6 wks course of therapy. He states he has good and bad days. Mostly with his energy level. Denies fever, chills, nightsweats, no pain at external PM site. No erythema at that site. He has loose stools from taking miralax and states that taste for food has changed slightly since being on antibiotics. He is at this visit with his grand-daughter.  Current Outpatient Prescriptions on File Prior to Visit  Medication Sig Dispense Refill  . acetaminophen (TYLENOL) 500 MG tablet Take 500-1,000 mg by mouth every 6 (six) hours as needed.       [name] albuterol (PROVENTIL HFA;VENTOLIN HFA) 108 (90 BASE) MCG/ACT inhaler Inhale 2 puffs into the lungs every 4 (four) hours as needed for wheezing or shortness of breath.       [name] aspirin 81 MG tablet Take 81 mg by mouth every morning. (hold if bleeding)      . calcium-vitamin D (OS-CAL 500 + D) 500-200 MG-UNIT per tablet Take 1 tablet by mouth 2 (two) times daily.        [name] ceFAZolin (ANCEF) 2-3 GM-% SOLR Inject 50 mLs (2 g total) into the vein every 8 (eight) hours. For 6 weeks, STOP DATE 12/16/12  60 each  2  . cetirizine (ZYRTEC) 10 MG tablet Take 10 mg by mouth at bedtime as needed for allergies.      . Cholecalciferol (VITAMIN D) 1000 UNITS capsule Take 1,000 Units by mouth 2 (two) times daily.        [name] dextromethorphan-guaiFENesin (MUCINEX DM) 30-600 MG per 12 hr tablet Take 1 tablet by mouth every 12 (twelve) hours as needed (cough).       . escitalopram (LEXAPRO) 20 MG tablet Take 10  mg by mouth at bedtime.       . famotidine (PEPCID) 20 MG tablet Take 20 mg by mouth at bedtime.       . finasteride (PROSCAR) 5 MG tablet Take 5 mg by mouth every morning.       . furosemide (LASIX) 20 MG tablet Take 20 mg by mouth daily. May take 1 extra daily if needed for swelling in legs      . hydrocortisone cream 1 % Apply 1 application topically 2 (two) times daily as needed (itchy rash).       . metFORMIN (GLUCOPHAGE) 500 MG tablet Take 500 mg by mouth 2 (two) times daily with a meal.       . methylcellulose (CITRUCEL) oral powder 1 scoop in 8oz of water at bedtime      . mometasone (NASONEX) 50 MCG/ACT nasal spray Place 2 sprays into the nose 2 (two) times daily.       . mometasone-formoterol (DULERA) 200-5 MCG/ACT AERO Inhale 2 puffs into the lungs 2 (two) times daily.      . Multiple Vitamin (MULTIVITAMIN) tablet Take 1 tablet by mouth daily.        [name] omeprazole (PRILOSEC) 20 MG capsule Take 40 mg by mouth daily before breakfast.       .  oxybutynin (DITROPAN) 5 MG tablet Take 10 mg by mouth every morning.       . polyethylene glycol powder (MIRALAX) powder Take 17 g by mouth at bedtime.       . potassium chloride SA (K-DUR,KLOR-CON) 20 MEQ tablet Take 20 mEq by mouth daily.      . sodium chloride 0.9 % injection 10-40 mLs by Intracatheter route as needed (flush).  10 mL  10  . sulindac (CLINORIL) 200 MG tablet Take 200 mg by mouth 2 (two) times daily as needed (pain).       . traMADol (ULTRAM) 50 MG tablet Take 50-100 mg by mouth every 6 (six) hours as needed for pain (cough).      . traZODone (DESYREL) 50 MG tablet Take 50 mg by mouth at bedtime.       No current facility-administered medications on file prior to visit.   Active Ambulatory Problems    Diagnosis Date Noted  . LYMPHOMA NEC, MLIG, INGUINAL/LOWER LIMB 05/10/2005  . VITAMIN D DEFICIENCY 10/02/2008  . HYPERLIPIDEMIA NEC/NOS 11/08/2006  . EXOGENOUS OBESITY 11/08/2006  . LEUKOCYTOPENIA UNSPECIFIED 04/24/2009  .  Essential hypertension, benign 12/25/2009  . VENOUS INSUFFICIENCY(PERIPHERAL) 10/02/2008  . Chronic rhinitis 11/08/2006  . GERD 08/23/2009  . HERNIA 12/05/2007  . URINARY OBSTRUCTION UNSPECIFIED 05/01/2008  . Back pain 11/08/2007  . SKIN RASH 05/21/2009  . Edema 11/08/2006  . DM (diabetes mellitus), secondary uncontrolled 06/06/2010  . Cough 06/08/2010  . Anemia 11/20/2010  . Asthma 11/27/2010  . Atrial fibrillation 03/16/2011  . Second degree AV block, Mobitz type II 03/16/2011  . Dizziness - light-headed 09/11/2011  . Chest pain, atypical 10/26/2011  . Shortness of breath 03/06/2012  . Memory impairment 09/22/2012  . Abdominal  pain, other specified site 09/30/2012  . Fever 10/28/2012  . Weakness 10/28/2012  . Abdominal pain 10/28/2012  . Lactic acidosis 10/28/2012  . Bacteremia due to Staphylococcus aureus 10/29/2012  . Acute encephalopathy 10/29/2012  . Other pancytopenia 10/29/2012  . Hyponatremia 10/29/2012  . Sepsis 10/29/2012  . Pacemaker infection 11/02/2012   Resolved Ambulatory Problems    Diagnosis Date Noted  . PNEUMONIA 04/24/2009  . DYSPNEA 01/31/2007  . Cough 11/08/2007  . CHEST PAIN UNSPECIFIED 11/08/2007  . Abdominal pain, generalized 12/07/2007  . SYNCOPE, HX OF 11/08/2008  . Bronchitis, acute 04/30/2010   Past Medical History  Diagnosis Date  . Dyspnea   . Malignant lymphoma of lymph nodes of inguinal region and lower limb April 2007  . Allergic rhinitis   . Hyperlipidemia   . Exogenous obesity   . Diabetes mellitus 2012  . Second degree Mobitz II AV block 12/11/08  . Paroxysmal atrial fibrillation 03/16/11  . Pancytopenia   . Postural dizziness      Review of Systems 12 point ROS reviewed, positive and negative pertinents listed in hpi    Objective:   Physical Exam BP 131/75  Pulse 70  Temp(Src) 97.9 F (36.6 C) (Oral)  Wt 252 lb (114.306 kg)  BMI 35.16 kg/m2 Physical Exam  Constitutional: He is oriented to person, place, and  time. He appears well-developed and well-nourished. No distress. In a wheelchair HENT:  Mouth/Throat: Oropharynx is clear and moist. No oropharyngeal exudate.  Cardiovascular: Normal rate, regular rhythm and normal heart sounds. Exam reveals no gallop and no friction rub. External pacemaker site is clean no signs of erythema or exudate or fluctuance No murmur heard.   Abdominal: Soft. Bowel sounds are normal. He exhibits no distension. There is  no tenderness.  Skin: Skin is warm and dry. No rash noted. No erythema. picc line site is clean      Assessment & Plan:  MSSA cardiac device infection/bacteremia = continue with current course of cefazolin 2gm IV Q 8hr to have last day of antibiotics on 12/16/12, 6 wks since removal of Pacemaker. He is to come back to clinic on 11/24 to have picc line pulled in addition to having repeat blood cultures (2 sets) to ensure ongoing clearance of bacteremia. He cleared his bacteremia on 10/5.

## 2012-11-23 ENCOUNTER — Encounter: Payer: Self-pay | Admitting: Internal Medicine

## 2012-11-23 ENCOUNTER — Ambulatory Visit (INDEPENDENT_AMBULATORY_CARE_PROVIDER_SITE_OTHER): Payer: Medicare Other | Admitting: Internal Medicine

## 2012-11-23 VITALS — BP 120/66 | HR 70 | Ht 71.0 in | Wt 252.0 lb

## 2012-11-23 DIAGNOSIS — A4901 Methicillin susceptible Staphylococcus aureus infection, unspecified site: Secondary | ICD-10-CM

## 2012-11-23 DIAGNOSIS — T827XXS Infection and inflammatory reaction due to other cardiac and vascular devices, implants and grafts, sequela: Secondary | ICD-10-CM

## 2012-11-23 DIAGNOSIS — R7881 Bacteremia: Secondary | ICD-10-CM

## 2012-11-23 DIAGNOSIS — T889XXS Complication of surgical and medical care, unspecified, sequela: Secondary | ICD-10-CM

## 2012-11-23 NOTE — Assessment & Plan Note (Signed)
His old device is out and healing nicely.of course the patient had no evidence of device infection but had staph bacteremia. He will continue a course of antibiotics, and we'll plan insertion of a permanent pacemaker a week or 2 after his antibiotics have been completed.

## 2012-11-23 NOTE — Progress Notes (Signed)
PCP: Sandrea Hughs, MD Primary Cardiologist: Hillis Range, MD  [name] is a 79 y.o. male who presents today for follow up of his recent pacemaker system extraction and implantation of a temporary external pacemaker.  Since last being seen in our clinic, the patient reports doing very well.  His external pacemaker site is without obvious signs of infection.  He is afebrile. He has had exercise intolerance and shortness of breath with exertion which is to be expected with VVI pacing. He denies syncope. Minimal peripheral edema.    Past Medical History  Diagnosis Date  . Dyspnea   . 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   . Allergic rhinitis   . Hyperlipidemia   . Exogenous obesity   . Edema     venous insufficiency  . Diabetes mellitus 2012    T2DM  . Second degree Mobitz II AV block 12/11/08    with transient syncope, resolved s/p PPM  . Paroxysmal atrial fibrillation 03/16/11    diagnosed by PPM interrogation  . Pancytopenia   . Postural dizziness    Past Surgical History  Procedure Laterality Date  . Lymph node biopsy      groin  . Sternotomy  2000  . Pericardiectomy  2000  . Penile prosthesis implant      and removal  . Pacemaker insertion  12/11/08    MDT implanted by Dr Johney Frame  . Tee without cardioversion N/A 10/31/2012    Procedure: TRANSESOPHAGEAL ECHOCARDIOGRAM (TEE);  Surgeon: Lewayne Bunting, MD;  Location: [facility] ENDOSCOPY;  Service: Cardiovascular;  Laterality: N/A;  . Pacemaker lead removal Left 11/04/2012    Procedure: PACEMAKER LEAD REMOVAL;  Surgeon: Marinus Maw, MD;  Location: [facility] OR;  Service: Cardiovascular;  Laterality: Left;  . Temporary pacemaker insertion Left 11/04/2012    Procedure: TEMPORARY PACEMAKER INSERTION;  Surgeon: Marinus Maw, MD;  Location: [facility] OR;  Service: Cardiovascular;  Laterality: Left;    Current Outpatient Prescriptions  Medication Sig Dispense Refill  . acetaminophen  (TYLENOL) 500 MG tablet Take 500-1,000 mg by mouth every 6 (six) hours as needed.       [name] albuterol (PROVENTIL HFA;VENTOLIN HFA) 108 (90 BASE) MCG/ACT inhaler Inhale 2 puffs into the lungs every 4 (four) hours as needed for wheezing or shortness of breath.       [name] aspirin 81 MG tablet Take 81 mg by mouth every morning. (hold if bleeding)      . calcium-vitamin D (OS-CAL 500 + D) 500-200 MG-UNIT per tablet Take 1 tablet by mouth 2 (two) times daily.       [name] ceFAZolin (ANCEF) 2-3 GM-% SOLR Inject 50 mLs (2 g total) into the vein every 8 (eight) hours. For 6 weeks, STOP DATE 12/16/12  60 each  2  . cetirizine (ZYRTEC) 10 MG tablet Take 10 mg by mouth at bedtime as needed for allergies.      . Cholecalciferol (VITAMIN D) 1000 UNITS capsule Take 1,000 Units by mouth 2 (two) times daily.        [name] dextromethorphan-guaiFENesin (MUCINEX DM) 30-600 MG per 12 hr tablet Take 1 tablet by mouth every 12 (twelve) hours as needed (cough).       . escitalopram (LEXAPRO) 20 MG tablet Take 10 mg by mouth at bedtime.       . famotidine (PEPCID) 20 MG tablet Take 20 mg by mouth at bedtime.       [name]  finasteride (PROSCAR) 5 MG tablet Take 5 mg by mouth every morning.       . furosemide (LASIX) 20 MG tablet Take 20 mg by mouth daily. May take 1 extra daily if needed for swelling in legs      . hydrocortisone cream 1 % Apply 1 application topically 2 (two) times daily as needed (itchy rash).       . metFORMIN (GLUCOPHAGE) 500 MG tablet Take 500 mg by mouth 2 (two) times daily with a meal.       . methylcellulose (CITRUCEL) oral powder 1 scoop in 8oz of water at bedtime      . mometasone (NASONEX) 50 MCG/ACT nasal spray Place 2 sprays into the nose 2 (two) times daily.       . mometasone-formoterol (DULERA) 200-5 MCG/ACT AERO Inhale 2 puffs into the lungs 2 (two) times daily.      . Multiple Vitamin (MULTIVITAMIN) tablet Take 1 tablet by mouth daily.        [name] omeprazole (PRILOSEC) 20 MG capsule Take 40 mg by mouth daily  before breakfast.       . oxybutynin (DITROPAN) 5 MG tablet Take 10 mg by mouth every morning.       . polyethylene glycol powder (MIRALAX) powder Take 17 g by mouth at bedtime.       . potassium chloride SA (K-DUR,KLOR-CON) 20 MEQ tablet Take 20 mEq by mouth daily.      . sodium chloride 0.9 % injection 10-40 mLs by Intracatheter route as needed (flush).  10 mL  10  . sulindac (CLINORIL) 200 MG tablet Take 200 mg by mouth 2 (two) times daily as needed (pain).       . traMADol (ULTRAM) 50 MG tablet Take 50-100 mg by mouth every 6 (six) hours as needed for pain (cough).      . traZODone (DESYREL) 50 MG tablet Take 50 mg by mouth at bedtime.       No current facility-administered medications for this visit.    Physical Exam: Filed Vitals:   11/23/12 1710  BP: 120/66  Pulse: 70  Height: 5\' 11"  (1.803 m)  Weight: 252 lb (114.306 kg)    GEN- The patient is well appearing, alert and oriented x 3 today.   Head- normocephalic, atraumatic Eyes-  Sclera clear, conjunctiva pink Ears- hearing intact Oropharynx- clear Lungs- Clear to ausculation bilaterally, normal work of breathing Chest- temporary external pacemaker affixed to the left anterior chest with tegaderm.  No redness, drainage.  Heart- Regular rate and rhythm, no murmurs, rubs or gallops, PMI not laterally displaced Extremities- no clubbing, cyanosis, or edema   Assessment and Plan:  1. Complete heart block Normal external temporary pacemaker function See Pace Art report Pt will finish IV antibiotics on 12-16-2012 with PICC removal on 12-19-2012.  We will plan right sided pacemaker re-implant first week of December.  Follow-up in 2 weeks for recheck.  Pt aware to call with signs of infection or dizzy spells.

## 2012-11-23 NOTE — Assessment & Plan Note (Signed)
He has no evidence of recurrent staph infection. He will continue his current antibiotic therapy.

## 2012-11-24 ENCOUNTER — Encounter: Payer: Self-pay | Admitting: Internal Medicine

## 2012-11-24 ENCOUNTER — Ambulatory Visit (INDEPENDENT_AMBULATORY_CARE_PROVIDER_SITE_OTHER): Payer: Medicare Other | Admitting: Internal Medicine

## 2012-11-24 VITALS — BP 130/68 | HR 70 | Temp 98.2°F | Ht 71.0 in | Wt 256.0 lb

## 2012-11-24 DIAGNOSIS — J45909 Unspecified asthma, uncomplicated: Secondary | ICD-10-CM

## 2012-11-24 DIAGNOSIS — R7881 Bacteremia: Secondary | ICD-10-CM

## 2012-11-24 DIAGNOSIS — E1365 Other specified diabetes mellitus with hyperglycemia: Secondary | ICD-10-CM

## 2012-11-24 DIAGNOSIS — A4901 Methicillin susceptible Staphylococcus aureus infection, unspecified site: Secondary | ICD-10-CM

## 2012-11-24 LAB — FUNGUS CULTURE W SMEAR: Fungal Smear: NONE SEEN

## 2012-11-24 NOTE — Patient Instructions (Signed)
See calendar for specific medication instructions and bring it back for each and every office visit for every healthcare provider you see.  Without it,  you may not receive the best quality medical care that we feel you deserve.  You will note that the calendar groups together  your maintenance  medications that are timed at particular times of the day.  Think of this as your checklist for what your doctor has instructed you to do until your next evaluation to see what benefit  there is  to staying on a consistent group of medications intended to keep you well.  The other group at the bottom is entirely up to you to use as you see fit  for specific symptoms that may arise between visits that require you to treat them on an as needed basis.  Think of this as your action plan or "what if" list.   Separating the top medications from the bottom group is fundamental to providing you adequate care going forward.    Please schedule a follow up office visit in 6 weeks, call sooner if needed  

## 2012-11-25 ENCOUNTER — Other Ambulatory Visit (HOSPITAL_BASED_OUTPATIENT_CLINIC_OR_DEPARTMENT_OTHER): Payer: Medicare Other | Admitting: Lab

## 2012-11-25 ENCOUNTER — Ambulatory Visit (HOSPITAL_BASED_OUTPATIENT_CLINIC_OR_DEPARTMENT_OTHER): Payer: Medicare Other

## 2012-11-25 VITALS — BP 130/67 | HR 70 | Temp 97.9°F

## 2012-11-25 DIAGNOSIS — D649 Anemia, unspecified: Secondary | ICD-10-CM

## 2012-11-25 DIAGNOSIS — K469 Unspecified abdominal hernia without obstruction or gangrene: Secondary | ICD-10-CM

## 2012-11-25 DIAGNOSIS — D469 Myelodysplastic syndrome, unspecified: Secondary | ICD-10-CM

## 2012-11-25 LAB — CBC WITH DIFFERENTIAL/PLATELET
BASO%: 0.7 % (ref 0.0–2.0)
Basophils Absolute: 0 10*3/uL (ref 0.0–0.1)
Eosinophils Absolute: 0 10*3/uL (ref 0.0–0.5)
HCT: 27.8 % — ABNORMAL LOW (ref 38.4–49.9)
HGB: 9.1 g/dL — ABNORMAL LOW (ref 13.0–17.1)
LYMPH%: 37 % (ref 14.0–49.0)
MCHC: 32.8 g/dL (ref 32.0–36.0)
MONO#: 0 10*3/uL — ABNORMAL LOW (ref 0.1–0.9)
NEUT#: 0.9 10*3/uL — ABNORMAL LOW (ref 1.5–6.5)
NEUT%: 59 % (ref 39.0–75.0)
Platelets: 81 10*3/uL — ABNORMAL LOW (ref 140–400)
RDW: 25 % — ABNORMAL HIGH (ref 11.0–14.6)
WBC: 1.6 10*3/uL — ABNORMAL LOW (ref 4.0–10.3)
lymph#: 0.6 10*3/uL — ABNORMAL LOW (ref 0.9–3.3)

## 2012-11-25 MED ORDER — DARBEPOETIN ALFA-POLYSORBATE 300 MCG/0.6ML IJ SOLN
300.0000 ug | Freq: Once | INTRAMUSCULAR | Status: AC
  Administered 2012-11-25: 300 ug via SUBCUTANEOUS
  Filled 2012-11-25: qty 0.6

## 2012-11-26 NOTE — Assessment & Plan Note (Signed)
Still on abx with dual chamber pacemaker resolved and plan to complete rx before new pacemaker  In meantime fatigue/ weakness prob related to deconditioning and lack of a kick, reviewed  No evidence of ongoing infection at this point    Each maintenance medication was reviewed in detail including most importantly the difference between maintenance and as needed and under what circumstances the prns are to be used. This was done in the context of a medication calendar review which provided the patient with a user-friendly unambiguous mechanism for medication administration and reconciliation and provides an action plan for all active problems. It is critical that this be shown to every doctor  for modification during the office visit if necessary so the patient can use it as a working document.

## 2012-11-26 NOTE — Assessment & Plan Note (Signed)
-   hfa   08/19/2012  75%   - trial of dulera 200 08/19/2012  > improved 09/05/12  All goals of chronic asthma control met including optimal function and elimination of symptoms with minimal need for rescue therapy.  Contingencies discussed in full including contacting this office immediately if not controlling the symptoms using the rule of two's.    

## 2012-11-26 NOTE — Progress Notes (Signed)
Subjective:     Patient ID: [name], male   DOB: 1932/10/11    MRN: [id number]  Brief patient profile:  41 yowm quit smoking 1970 with morbid obesity with boderline DM, Lymphoma s/p chemo ([facility]) and tendency to peripheral edema that is felt to be dependent and related to obesity/ venous insuff.   History of Present Illness  09/05/2012 acute  ov/Wert new problem = rash, breathing and cough resolved Chief Complaint  Patient presents with  . Acute Visit    Pt c/o rash and burning sensation left shoulder radiating down left arm- started to notice about a wk ago  initially though to be shingles but rash is actually bilateral upper arms and abd wall, itching rather than painful, no assoc fever or ha, arthralgias  >>refer to derm , vistaril rx   09/19/2012 Follow up  Returns for follow up for memory issues.  More forgetful. Family noticing trouble with memory as well.  MMSE today was 27/30, had difficulty with answers.  Clock draw was okay but took him a long time.  Had real trouble writing a sentence-he finally gave up.  No FH of Dementia per pt.  No headache, syncope, seizure act, visual/speech changes.  rec No change rx Neuro referral     09/30/2012 acute  ov/Wert re "abd pain" Chief Complaint  Patient presents with  . Acute Visit    Pt c/o low abd pain and right side pain x 2 days. He also states urine has been dark in color for the past 3 days.   has not taken any pain med, no rigors, no n or v, pain worse across both sides of back when bends over. Remembers "missing a step" prior to onset of back pain which forms a c around upper abd but is equal on both sides. No sob.  No change chronic cough or worse pain with cough rec As per calendar, take the tramadol 50 mg up to every 4 hours as needed for pain   Admit date: 10/27/2012  Discharge date: 11/08/2012  Primary Care Physician: Sandrea Hughs, MD  Final Discharge Diagnoses:  Staph aureus bacteremia/sepsis of unclear  etiology, likely with assumption that the pacemaker is contaminated  Secondary diagnosis . acute encephalopathy . Abdominal pain . Bacteremia due to Staphylococcus aureus . generalized debility  . Other pancytopenia . Hyponatremia . LYMPHOMA NEC, MLIG, INGUINAL/LOWER LIMB . Essential hypertension, benign . GERD . DM (diabetes mellitus), secondary uncontrolled    10/30 /2014 post hosp f/u ov/Wert re: asthma/ dm/ sp staph bacteremia Chief Complaint  Patient presents with  . Follow-up    Pt states recently d/ced from [facility] on 11/08/12- feeling fatigued since then.    In the process of completing 4 weeks IV abx with removal of permanent pacemaker f/u per Dr Ladona Ridgel Not limited by breathing, rarely needing saba, no sign  cough. Fasting sugars in the 120-140 range.  No  ex/pleuritic cp or chest tightness, subjective wheeze overt sinus or hb symptoms. No unusual exp hx or h/o childhood pna/ asthma or knowledge of premature birth.   Sleeping ok without nocturnal  or early am exacerbation  of respiratory  c/o's or need for noct saba. Also denies any obvious fluctuation of symptoms with weather or environmental changes or other aggravating or alleviating factors except as outlined above     Current Medications, Allergies, Past Medical History, Past Surgical History, Family History, and Social History were reviewed in [employer] record.  ROS  The following are not active complaints unless bolded sore throat, dysphagia, dental problems, itching, sneezing,  nasal congestion or excess/ purulent secretions, ear ache,   fever, chills, sweats, unintended wt loss, pleuritic or exertional cp, hemoptysis,  orthopnea pnd or leg swelling, presyncope, palpitations, heartburn, abdominal pain, anorexia, nausea, vomiting, diarrhea  or change in bowel or urinary habits, change in stools or urine, dysuria,hematuria,  rash, arthralgias, visual complaints, headache, numbness  weakness or ataxia or problems with walking or coordination,  change in mood/affect or memory.                 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   [facility]  - MRI 11/23/2007 c/w L1-2 bulging disc indents the thecal sac with foraminal stenosis  HEALTH MAINTENANCE..........................................................................................[facility]  - Td 10/2005  - Pneumovax 10/07 second shot  - CPX 08/20/2010  --colon 02/2005 -int/extern. hems (repeat q7y)  Complex med regimen  --Meds reviewed with pt education and computerized med calendar completed/adjusted. November 08, 2008 , August 21, 2009 updated 02/19/2011 , 10/23/2011  Syncope...........................................................................................................[facility]  - Mobitz II AV block s/p Medtronic pacemaker placement 12/11/2008  [facility].....................................................................................................[facility]  - Pruritic rash 04/2009 > tried lotrisone, referred to [facility] August 06, 2009  Memory impairment , MMSE 27/30 8/25>refer to neuro   Family History:   mother had diabetes   Social History:  Patient states former smoker.  quit smoking in 1970  No ETOH  Retired            Objective:   Physical Exam wt 244 January 16, 2008  > 249 April 24, 2009 > 249 10/06/2010 >  11/27/2010  245 > 01/07/2011 241 > 02/19/11 238 > 06/04/2011  247 >07/03/2011 246 > 07/31/2011  248 > 09/11/2011  247 >246 10/23/2011 > 258 02/15/2012 > 05/19/2012  258 > 08/19/2012 255 >  266 09/05/12>  11/24/12 256  Ambulatory obese wm in no acute distress but seems overall much more frail  HEENT: nl dentition, turbinates, and orophanx.  Mucus membranes pink and moist. No tongue or throat exudate.  Neck: supple with full ROM. no JVD, node enlargement or TMJ   Lungs: clear and equal bilateral breath sounds. No wheezing, rhonchi or rales  Chest: RRR. No murmurs, rubs, or gallops.  Tr-1+ plus peripheral edema.  Abd: quite distended and obese but soft and no rebound or focal tenderness. Normal excursion in the supine position.  Ext warm and dry, no calf tenderness, cyanosis or clubbing. mild/mod chronic venous insufficiency changes. Varicose veins present. Neuro: nl sensorium and gait..  Skin:   Few excortiated lesions noted on arms     09/19/2012  MMSE 27/30  Clock draw : nml  CXR  08/19/2012 : Cardiomegaly with mild chronic peribronchial thickening. No evidence for focal airspace disease or frank pulmonary edema.     CXR  09/30/2012 : 1. Stable cardiomegaly without failure.  2. Emphysema.    Assessment:

## 2012-11-26 NOTE — Assessment & Plan Note (Signed)
-   New onset 2012 while on freq doses of pred for airways dz >>Nutritionist referral 06/20/2010  And started on metfomin 07/02/10  Hemoglobin A1C  Date Value Range Status  08/19/2012 7.1* 4.6 - 6.5 % Final     Glycemic Control Guidelines for People with Diabetes:Non Diabetic:  <6%Goal of Therapy: <7%Additional Action Suggested:  >8%   05/19/2012 6.6* 4.6 - 6.5 % Final     Glycemic Control Guidelines for People with Diabetes:Non Diabetic:  <6%Goal of Therapy: <7%Additional Action Suggested:  >8%   02/15/2012 6.4  4.6 - 6.5 % Final     Glycemic Control Guidelines for People with Diabetes:Non Diabetic:  <6%Goal of Therapy: <7%Additional Action Suggested:  >8%    Adequate control on present rx, reviewed > no change in rx needed

## 2012-12-05 ENCOUNTER — Other Ambulatory Visit: Payer: Self-pay | Admitting: Oncology

## 2012-12-05 ENCOUNTER — Ambulatory Visit (HOSPITAL_BASED_OUTPATIENT_CLINIC_OR_DEPARTMENT_OTHER): Payer: Medicare Other

## 2012-12-05 ENCOUNTER — Ambulatory Visit (HOSPITAL_BASED_OUTPATIENT_CLINIC_OR_DEPARTMENT_OTHER): Payer: Medicare Other | Admitting: Lab

## 2012-12-05 ENCOUNTER — Ambulatory Visit (HOSPITAL_COMMUNITY)
Admission: RE | Admit: 2012-12-05 | Discharge: 2012-12-05 | Disposition: A | Discharge status: Home / Self Care | Payer: Medicare Other | Source: Ambulatory Visit | Attending: Oncology | Admitting: Oncology

## 2012-12-05 ENCOUNTER — Telehealth: Payer: Self-pay | Admitting: *Deleted

## 2012-12-05 VITALS — BP 130/77 | HR 70 | Temp 98.3°F | Resp 18

## 2012-12-05 DIAGNOSIS — R609 Edema, unspecified: Secondary | ICD-10-CM | POA: Diagnosis present

## 2012-12-05 DIAGNOSIS — I872 Venous insufficiency (chronic) (peripheral): Secondary | ICD-10-CM | POA: Diagnosis present

## 2012-12-05 DIAGNOSIS — E785 Hyperlipidemia, unspecified: Secondary | ICD-10-CM | POA: Diagnosis present

## 2012-12-05 DIAGNOSIS — K219 Gastro-esophageal reflux disease without esophagitis: Secondary | ICD-10-CM | POA: Diagnosis present

## 2012-12-05 DIAGNOSIS — C8595 Non-Hodgkin lymphoma, unspecified, lymph nodes of inguinal region and lower limb: Secondary | ICD-10-CM | POA: Diagnosis present

## 2012-12-05 DIAGNOSIS — D649 Anemia, unspecified: Secondary | ICD-10-CM

## 2012-12-05 DIAGNOSIS — Z950 Presence of cardiac pacemaker: Secondary | ICD-10-CM

## 2012-12-05 DIAGNOSIS — E1365 Other specified diabetes mellitus with hyperglycemia: Secondary | ICD-10-CM | POA: Diagnosis present

## 2012-12-05 DIAGNOSIS — Z800 Family history of malignant neoplasm of digestive organs: Secondary | ICD-10-CM

## 2012-12-05 DIAGNOSIS — Z7982 Long term (current) use of aspirin: Secondary | ICD-10-CM

## 2012-12-05 DIAGNOSIS — E039 Hypothyroidism, unspecified: Secondary | ICD-10-CM | POA: Diagnosis present

## 2012-12-05 DIAGNOSIS — Z79899 Other long term (current) drug therapy: Secondary | ICD-10-CM

## 2012-12-05 DIAGNOSIS — IMO0002 Reserved for concepts with insufficient information to code with codable children: Secondary | ICD-10-CM | POA: Diagnosis present

## 2012-12-05 DIAGNOSIS — R55 Syncope and collapse: Secondary | ICD-10-CM | POA: Diagnosis present

## 2012-12-05 DIAGNOSIS — E871 Hypo-osmolality and hyponatremia: Principal | ICD-10-CM | POA: Diagnosis present

## 2012-12-05 DIAGNOSIS — I441 Atrioventricular block, second degree: Secondary | ICD-10-CM | POA: Diagnosis present

## 2012-12-05 DIAGNOSIS — Z87891 Personal history of nicotine dependence: Secondary | ICD-10-CM

## 2012-12-05 DIAGNOSIS — I509 Heart failure, unspecified: Secondary | ICD-10-CM | POA: Diagnosis present

## 2012-12-05 DIAGNOSIS — Z833 Family history of diabetes mellitus: Secondary | ICD-10-CM

## 2012-12-05 DIAGNOSIS — I4891 Unspecified atrial fibrillation: Secondary | ICD-10-CM | POA: Diagnosis present

## 2012-12-05 DIAGNOSIS — I5033 Acute on chronic diastolic (congestive) heart failure: Secondary | ICD-10-CM | POA: Diagnosis present

## 2012-12-05 DIAGNOSIS — E669 Obesity, unspecified: Secondary | ICD-10-CM | POA: Diagnosis present

## 2012-12-05 DIAGNOSIS — D61818 Other pancytopenia: Secondary | ICD-10-CM | POA: Diagnosis present

## 2012-12-05 DIAGNOSIS — Z6836 Body mass index (BMI) 36.0-36.9, adult: Secondary | ICD-10-CM

## 2012-12-05 DIAGNOSIS — I10 Essential (primary) hypertension: Secondary | ICD-10-CM | POA: Diagnosis present

## 2012-12-05 DIAGNOSIS — D469 Myelodysplastic syndrome, unspecified: Secondary | ICD-10-CM | POA: Diagnosis present

## 2012-12-05 DIAGNOSIS — E0781 Sick-euthyroid syndrome: Secondary | ICD-10-CM | POA: Diagnosis present

## 2012-12-05 LAB — CBC WITH DIFFERENTIAL/PLATELET
EOS%: 2.3 % (ref 0.0–7.0)
Eosinophils Absolute: 0 10*3/uL (ref 0.0–0.5)
HCT: 24.5 % — ABNORMAL LOW (ref 38.4–49.9)
HGB: 8.3 g/dL — ABNORMAL LOW (ref 13.0–17.1)
MCH: 35.2 pg — ABNORMAL HIGH (ref 27.2–33.4)
MONO#: 0 10*3/uL — ABNORMAL LOW (ref 0.1–0.9)
MONO%: 1.9 % (ref 0.0–14.0)
NEUT#: 0.6 10*3/uL — ABNORMAL LOW (ref 1.5–6.5)
RBC: 2.35 10*6/uL — ABNORMAL LOW (ref 4.20–5.82)
RDW: 26 % — ABNORMAL HIGH (ref 11.0–14.6)
WBC: 1.1 10*3/uL — ABNORMAL LOW (ref 4.0–10.3)
lymph#: 0.4 10*3/uL — ABNORMAL LOW (ref 0.9–3.3)

## 2012-12-05 LAB — PREPARE RBC (CROSSMATCH)

## 2012-12-05 LAB — HOLD TUBE, BLOOD BANK

## 2012-12-05 MED ORDER — HEPARIN SOD (PORK) LOCK FLUSH 100 UNIT/ML IV SOLN
250.0000 [IU] | INTRAVENOUS | Status: AC | PRN
  Administered 2012-12-05: 250 [IU]
  Filled 2012-12-05: qty 5

## 2012-12-05 MED ORDER — DIPHENHYDRAMINE HCL 25 MG PO CAPS
ORAL_CAPSULE | ORAL | Status: AC
  Filled 2012-12-05: qty 1

## 2012-12-05 MED ORDER — ACETAMINOPHEN 325 MG PO TABS
650.0000 mg | ORAL_TABLET | Freq: Once | ORAL | Status: AC
  Administered 2012-12-05: 650 mg via ORAL

## 2012-12-05 MED ORDER — SODIUM CHLORIDE 0.9 % IV SOLN
250.0000 mL | Freq: Once | INTRAVENOUS | Status: AC
  Administered 2012-12-05: 250 mL via INTRAVENOUS

## 2012-12-05 MED ORDER — DIPHENHYDRAMINE HCL 25 MG PO CAPS
25.0000 mg | ORAL_CAPSULE | Freq: Once | ORAL | Status: AC
  Administered 2012-12-05: 25 mg via ORAL

## 2012-12-05 MED ORDER — ACETAMINOPHEN 325 MG PO TABS
ORAL_TABLET | ORAL | Status: AC
  Filled 2012-12-05: qty 2

## 2012-12-05 MED ORDER — SODIUM CHLORIDE 0.9 % IJ SOLN
3.0000 mL | INTRAMUSCULAR | Status: AC | PRN
  Administered 2012-12-05: 3 mL
  Filled 2012-12-05: qty 10

## 2012-12-05 NOTE — Telephone Encounter (Signed)
Received call from daughter [name] informing nurse that pt has been feeling weak - worse yesterday.  Pt has been receiving home IV Antibiotics.  Pt wanted to know if he could come in for lab check for possible blood transfusion.  Per [name], pt still able to ambulate, does get short of breath with exertion.  Informing [name] that message will be relayed to Belenda Cruise, NP for further instructions. Stephanie's  Phone   [phone].

## 2012-12-05 NOTE — Patient Instructions (Signed)
Blood Transfusion Information WHAT IS A BLOOD TRANSFUSION? A transfusion is the replacement of blood or some of its parts. Blood is made up of multiple cells which provide different functions.  Red blood cells carry oxygen and are used for blood loss replacement.  White blood cells fight against infection.  Platelets control bleeding.  Plasma helps clot blood.  Other blood products are available for specialized needs, such as hemophilia or other clotting disorders. BEFORE THE TRANSFUSION  Who gives blood for transfusions?   You may be able to donate blood to be used at a later date on yourself (autologous donation).  Relatives can be asked to donate blood. This is generally not any safer than if you have received blood from a stranger. The same precautions are taken to ensure safety when a relative's blood is donated.  Healthy volunteers who are fully evaluated to make sure their blood is safe. This is blood bank blood. Transfusion therapy is the safest it has ever been in the practice of medicine. Before blood is taken from a donor, a complete history is taken to make sure that person has no history of diseases nor engages in risky social behavior (examples are intravenous drug use or sexual activity with multiple partners). The donor's travel history is screened to minimize risk of transmitting infections, such as malaria. The donated blood is tested for signs of infectious diseases, such as HIV and hepatitis. The blood is then tested to be sure it is compatible with you in order to minimize the chance of a transfusion reaction. If you or a relative donates blood, this is often done in anticipation of surgery and is not appropriate for emergency situations. It takes many days to process the donated blood. RISKS AND COMPLICATIONS Although transfusion therapy is very safe and saves many lives, the main dangers of transfusion include:   Getting an infectious disease.  Developing a  transfusion reaction. This is an allergic reaction to something in the blood you were given. Every precaution is taken to prevent this. The decision to have a blood transfusion has been considered carefully by your caregiver before blood is given. Blood is not given unless the benefits outweigh the risks. AFTER THE TRANSFUSION  Right after receiving a blood transfusion, you will usually feel much better and more energetic. This is especially true if your red blood cells have gotten low (anemic). The transfusion raises the level of the red blood cells which carry oxygen, and this usually causes an energy increase.  The nurse administering the transfusion will monitor you carefully for complications. HOME CARE INSTRUCTIONS  No special instructions are needed after a transfusion. You may find your energy is better. Speak with your caregiver about any limitations on activity for underlying diseases you may have. SEEK MEDICAL CARE IF:   Your condition is not improving after your transfusion.  You develop redness or irritation at the intravenous (IV) site. SEEK IMMEDIATE MEDICAL CARE IF:  Any of the following symptoms occur over the next 12 hours:  Shaking chills.  You have a temperature by mouth above 102 F (38.9 C), not controlled by medicine.  Chest, back, or muscle pain.  People around you feel you are not acting correctly or are confused.  Shortness of breath or difficulty breathing.  Dizziness and fainting.  You get a rash or develop hives.  You have a decrease in urine output.  Your urine turns a dark color or changes to pink, red, or brown. Any of the following   symptoms occur over the next 10 days:  You have a temperature by mouth above 102 F (38.9 C), not controlled by medicine.  Shortness of breath.  Weakness after normal activity.  The white part of the eye turns yellow (jaundice).  You have a decrease in the amount of urine or are urinating less often.  Your  urine turns a dark color or changes to pink, red, or brown. Document Released: 01/10/2000 Document Revised: 04/06/2011 Document Reviewed: 08/29/2007 ExitCare Patient Information 2014 [employer].  

## 2012-12-05 NOTE — Telephone Encounter (Signed)
Called daughter [name] back and instructed her to bring pt in at 1115 am for lab as per Belenda Cruise, NP.  Instructed her to have pt wait in the lobby after CBC draw for further advice.  Per Michail Sermon, NP -  Transfuse blood only if Hgb is less than  8.0 ;  If  Hgb  Above  8.0 ,  Then pt needs to talk to his PCP and/or his cardiologist for further evaluation.  Stephanie's  Cell phone   [phone].

## 2012-12-06 ENCOUNTER — Telehealth: Payer: Self-pay | Admitting: Nurse Practitioner

## 2012-12-06 ENCOUNTER — Emergency Department (HOSPITAL_COMMUNITY): Payer: Medicare Other

## 2012-12-06 ENCOUNTER — Encounter: Payer: Medicare Other | Admitting: Internal Medicine

## 2012-12-06 ENCOUNTER — Encounter (HOSPITAL_COMMUNITY): Payer: Self-pay | Admitting: Emergency Medicine

## 2012-12-06 ENCOUNTER — Other Ambulatory Visit: Payer: Self-pay

## 2012-12-06 ENCOUNTER — Inpatient Hospital Stay (HOSPITAL_COMMUNITY)
Admission: EM | Admit: 2012-12-06 | Discharge: 2012-12-12 | DRG: 640 | Disposition: A | Discharge status: Home Health | Payer: Medicare Other | Attending: Internal Medicine | Admitting: Internal Medicine

## 2012-12-06 DIAGNOSIS — E785 Hyperlipidemia, unspecified: Secondary | ICD-10-CM

## 2012-12-06 DIAGNOSIS — R0602 Shortness of breath: Secondary | ICD-10-CM

## 2012-12-06 DIAGNOSIS — E559 Vitamin D deficiency, unspecified: Secondary | ICD-10-CM

## 2012-12-06 DIAGNOSIS — R531 Weakness: Secondary | ICD-10-CM

## 2012-12-06 DIAGNOSIS — I441 Atrioventricular block, second degree: Secondary | ICD-10-CM

## 2012-12-06 DIAGNOSIS — E1365 Other specified diabetes mellitus with hyperglycemia: Secondary | ICD-10-CM

## 2012-12-06 DIAGNOSIS — E669 Obesity, unspecified: Secondary | ICD-10-CM

## 2012-12-06 DIAGNOSIS — T827XXD Infection and inflammatory reaction due to other cardiac and vascular devices, implants and grafts, subsequent encounter: Secondary | ICD-10-CM

## 2012-12-06 DIAGNOSIS — I4891 Unspecified atrial fibrillation: Secondary | ICD-10-CM

## 2012-12-06 DIAGNOSIS — R109 Unspecified abdominal pain: Secondary | ICD-10-CM

## 2012-12-06 DIAGNOSIS — R059 Cough, unspecified: Secondary | ICD-10-CM

## 2012-12-06 DIAGNOSIS — A419 Sepsis, unspecified organism: Secondary | ICD-10-CM

## 2012-12-06 DIAGNOSIS — T827XXS Infection and inflammatory reaction due to other cardiac and vascular devices, implants and grafts, sequela: Secondary | ICD-10-CM

## 2012-12-06 DIAGNOSIS — N139 Obstructive and reflux uropathy, unspecified: Secondary | ICD-10-CM

## 2012-12-06 DIAGNOSIS — A4901 Methicillin susceptible Staphylococcus aureus infection, unspecified site: Secondary | ICD-10-CM

## 2012-12-06 DIAGNOSIS — R413 Other amnesia: Secondary | ICD-10-CM

## 2012-12-06 DIAGNOSIS — J310 Chronic rhinitis: Secondary | ICD-10-CM

## 2012-12-06 DIAGNOSIS — M549 Dorsalgia, unspecified: Secondary | ICD-10-CM

## 2012-12-06 DIAGNOSIS — I5033 Acute on chronic diastolic (congestive) heart failure: Secondary | ICD-10-CM

## 2012-12-06 DIAGNOSIS — R0789 Other chest pain: Secondary | ICD-10-CM

## 2012-12-06 DIAGNOSIS — E872 Acidosis, unspecified: Secondary | ICD-10-CM

## 2012-12-06 DIAGNOSIS — D649 Anemia, unspecified: Secondary | ICD-10-CM

## 2012-12-06 DIAGNOSIS — J45909 Unspecified asthma, uncomplicated: Secondary | ICD-10-CM

## 2012-12-06 DIAGNOSIS — R7881 Bacteremia: Secondary | ICD-10-CM

## 2012-12-06 DIAGNOSIS — IMO0002 Reserved for concepts with insufficient information to code with codable children: Secondary | ICD-10-CM

## 2012-12-06 DIAGNOSIS — C8595 Non-Hodgkin lymphoma, unspecified, lymph nodes of inguinal region and lower limb: Secondary | ICD-10-CM

## 2012-12-06 DIAGNOSIS — R609 Edema, unspecified: Secondary | ICD-10-CM

## 2012-12-06 DIAGNOSIS — R509 Fever, unspecified: Secondary | ICD-10-CM

## 2012-12-06 DIAGNOSIS — R21 Rash and other nonspecific skin eruption: Secondary | ICD-10-CM

## 2012-12-06 DIAGNOSIS — I872 Venous insufficiency (chronic) (peripheral): Secondary | ICD-10-CM

## 2012-12-06 DIAGNOSIS — E871 Hypo-osmolality and hyponatremia: Principal | ICD-10-CM

## 2012-12-06 DIAGNOSIS — K219 Gastro-esophageal reflux disease without esophagitis: Secondary | ICD-10-CM

## 2012-12-06 DIAGNOSIS — D72819 Decreased white blood cell count, unspecified: Secondary | ICD-10-CM

## 2012-12-06 DIAGNOSIS — I10 Essential (primary) hypertension: Secondary | ICD-10-CM

## 2012-12-06 DIAGNOSIS — K469 Unspecified abdominal hernia without obstruction or gangrene: Secondary | ICD-10-CM

## 2012-12-06 DIAGNOSIS — R05 Cough: Secondary | ICD-10-CM

## 2012-12-06 DIAGNOSIS — G9340 Encephalopathy, unspecified: Secondary | ICD-10-CM

## 2012-12-06 DIAGNOSIS — D61818 Other pancytopenia: Secondary | ICD-10-CM

## 2012-12-06 HISTORY — DX: Heart failure, unspecified: I50.9

## 2012-12-06 LAB — TYPE AND SCREEN
ABO/RH(D): A POS
Unit division: 0

## 2012-12-06 LAB — COMPREHENSIVE METABOLIC PANEL
ALT: <5 U/L (ref 0–53)
AST: 17 U/L (ref 0–37)
Albumin: 3.5 g/dL (ref 3.5–5.2)
Alkaline Phosphatase: 82 U/L (ref 39–117)
Chloride: 87 mEq/L — ABNORMAL LOW (ref 96–112)
GFR calc Af Amer: >90 mL/min (ref >90)
GFR calc non Af Amer: 90 mL/min — ABNORMAL LOW (ref >90)
Potassium: 4.1 mEq/L (ref 3.5–5.1)
Sodium: 120 mEq/L — ABNORMAL LOW (ref 135–145)
Total Bilirubin: 0.7 mg/dL (ref 0.3–1.2)
Total Protein: 6.5 g/dL (ref 6.0–8.3)

## 2012-12-06 LAB — POCT I-STAT TROPONIN I: Troponin i, poc: 0.04 ng/mL (ref 0.00–0.08)

## 2012-12-06 LAB — CBC WITH DIFFERENTIAL/PLATELET
Basophils Absolute: 0 10*3/uL (ref 0.0–0.1)
Basophils Relative: 1 % (ref 0–1)
Hemoglobin: 10.4 g/dL — ABNORMAL LOW (ref 13.0–17.0)
Lymphs Abs: 0.5 10*3/uL — ABNORMAL LOW (ref 0.7–4.0)
MCHC: 35.7 g/dL (ref 30.0–36.0)
Monocytes Relative: 1 % — ABNORMAL LOW (ref 3–12)
Neutro Abs: 1.2 10*3/uL — ABNORMAL LOW (ref 1.7–7.7)
Neutrophils Relative %: 68 % (ref 43–77)
RBC: 3 MIL/uL — ABNORMAL LOW (ref 4.22–5.81)
RDW: 22.9 % — ABNORMAL HIGH (ref 11.5–15.5)
WBC: 1.8 10*3/uL — ABNORMAL LOW (ref 4.0–10.5)

## 2012-12-06 LAB — URINALYSIS, ROUTINE W REFLEX MICROSCOPIC
Glucose, UA: NEGATIVE mg/dL
Ketones, ur: NEGATIVE mg/dL
Leukocytes, UA: NEGATIVE
pH: 6.5 (ref 5.0–8.0)

## 2012-12-06 LAB — PRO B NATRIURETIC PEPTIDE: Pro B Natriuretic peptide (BNP): 2518 pg/mL — ABNORMAL HIGH (ref 0–450)

## 2012-12-06 MED ORDER — POTASSIUM CHLORIDE CRYS ER 20 MEQ PO TBCR
20.0000 meq | EXTENDED_RELEASE_TABLET | Freq: Every day | ORAL | Status: DC
  Administered 2012-12-07 – 2012-12-12 (×6): 20 meq via ORAL
  Filled 2012-12-06 (×6): qty 1

## 2012-12-06 MED ORDER — POLYETHYLENE GLYCOL 3350 17 GM/SCOOP PO POWD
17.0000 g | Freq: Every day | ORAL | Status: DC
  Filled 2012-12-06: qty 255

## 2012-12-06 MED ORDER — PANTOPRAZOLE SODIUM 40 MG PO TBEC
40.0000 mg | DELAYED_RELEASE_TABLET | Freq: Every day | ORAL | Status: DC
  Administered 2012-12-07 – 2012-12-12 (×6): 40 mg via ORAL
  Filled 2012-12-06 (×6): qty 1

## 2012-12-06 MED ORDER — FLUTICASONE PROPIONATE 50 MCG/ACT NA SUSP
1.0000 | Freq: Every day | NASAL | Status: DC
  Administered 2012-12-07 – 2012-12-12 (×5): 1 via NASAL
  Filled 2012-12-06 (×2): qty 16

## 2012-12-06 MED ORDER — MOMETASONE FURO-FORMOTEROL FUM 200-5 MCG/ACT IN AERO
2.0000 | INHALATION_SPRAY | Freq: Two times a day (BID) | RESPIRATORY_TRACT | Status: DC
  Administered 2012-12-07 – 2012-12-12 (×10): 2 via RESPIRATORY_TRACT
  Filled 2012-12-06: qty 8.8

## 2012-12-06 MED ORDER — VITAMIN D 1000 UNITS PO TABS
1000.0000 [IU] | ORAL_TABLET | Freq: Two times a day (BID) | ORAL | Status: DC
  Administered 2012-12-07 – 2012-12-12 (×12): 1000 [IU] via ORAL
  Filled 2012-12-06 (×13): qty 1

## 2012-12-06 MED ORDER — ONDANSETRON HCL 4 MG PO TABS: 4.0000 mg | ORAL_TABLET | Freq: Four times a day (QID) | ORAL | Status: DC | PRN

## 2012-12-06 MED ORDER — FAMOTIDINE 20 MG PO TABS
20.0000 mg | ORAL_TABLET | Freq: Every day | ORAL | Status: DC
  Administered 2012-12-07 – 2012-12-11 (×6): 20 mg via ORAL
  Filled 2012-12-06 (×7): qty 1

## 2012-12-06 MED ORDER — ADULT MULTIVITAMIN W/MINERALS CH
1.0000 | ORAL_TABLET | Freq: Every day | ORAL | Status: DC
  Administered 2012-12-07 – 2012-12-12 (×6): 1 via ORAL
  Filled 2012-12-06 (×6): qty 1

## 2012-12-06 MED ORDER — TRAMADOL HCL 50 MG PO TABS
50.0000 mg | ORAL_TABLET | Freq: Four times a day (QID) | ORAL | Status: DC | PRN
  Administered 2012-12-09: 100 mg via ORAL
  Filled 2012-12-06: qty 2

## 2012-12-06 MED ORDER — CALCIUM CARBONATE-VITAMIN D 500-200 MG-UNIT PO TABS
1.0000 | ORAL_TABLET | Freq: Two times a day (BID) | ORAL | Status: DC
  Administered 2012-12-07 – 2012-12-12 (×12): 1 via ORAL
  Filled 2012-12-06 (×13): qty 1

## 2012-12-06 MED ORDER — SODIUM CHLORIDE 0.9 % IJ SOLN
3.0000 mL | Freq: Two times a day (BID) | INTRAMUSCULAR | Status: DC
  Administered 2012-12-08: 3 mL via INTRAVENOUS

## 2012-12-06 MED ORDER — ASPIRIN 81 MG PO CHEW
81.0000 mg | CHEWABLE_TABLET | Freq: Every morning | ORAL | Status: DC
  Administered 2012-12-07 – 2012-12-12 (×6): 81 mg via ORAL
  Filled 2012-12-06 (×6): qty 1

## 2012-12-06 MED ORDER — SODIUM CHLORIDE 0.9 % IJ SOLN
10.0000 mL | INTRAMUSCULAR | Status: DC | PRN
  Administered 2012-12-10: 11:00:00 10 mL

## 2012-12-06 MED ORDER — ALBUTEROL SULFATE HFA 108 (90 BASE) MCG/ACT IN AERS: 2.0000 | INHALATION_SPRAY | RESPIRATORY_TRACT | Status: DC | PRN

## 2012-12-06 MED ORDER — SODIUM CHLORIDE 0.9 % IJ SOLN: 3.0000 mL | INTRAMUSCULAR | Status: DC | PRN

## 2012-12-06 MED ORDER — ONDANSETRON HCL 4 MG/2ML IJ SOLN: 4.0000 mg | Freq: Four times a day (QID) | INTRAMUSCULAR | Status: DC | PRN

## 2012-12-06 MED ORDER — FUROSEMIDE 10 MG/ML IJ SOLN
40.0000 mg | Freq: Two times a day (BID) | INTRAMUSCULAR | Status: DC
  Filled 2012-12-06: qty 4

## 2012-12-06 MED ORDER — ACETAMINOPHEN 650 MG RE SUPP: 650.0000 mg | Freq: Four times a day (QID) | RECTAL | Status: DC | PRN

## 2012-12-06 MED ORDER — LORATADINE 10 MG PO TABS
10.0000 mg | ORAL_TABLET | Freq: Every day | ORAL | Status: DC | PRN
  Filled 2012-12-06: qty 1

## 2012-12-06 MED ORDER — FUROSEMIDE 10 MG/ML IJ SOLN
40.0000 mg | Freq: Once | INTRAMUSCULAR | Status: AC
  Administered 2012-12-07: 02:00:00 40 mg via INTRAVENOUS
  Filled 2012-12-06: qty 4

## 2012-12-06 MED ORDER — ACETAMINOPHEN 325 MG PO TABS
650.0000 mg | ORAL_TABLET | Freq: Four times a day (QID) | ORAL | Status: DC | PRN
  Administered 2012-12-09: 650 mg via ORAL
  Filled 2012-12-06: qty 2

## 2012-12-06 MED ORDER — OXYBUTYNIN CHLORIDE 5 MG PO TABS
10.0000 mg | ORAL_TABLET | Freq: Every morning | ORAL | Status: DC
  Administered 2012-12-07 – 2012-12-12 (×6): 10 mg via ORAL
  Filled 2012-12-06 (×6): qty 2

## 2012-12-06 MED ORDER — INSULIN ASPART 100 UNIT/ML ~~LOC~~ SOLN
0.0000 [IU] | Freq: Three times a day (TID) | SUBCUTANEOUS | Status: DC
  Administered 2012-12-09 – 2012-12-11 (×5): 1 [IU] via SUBCUTANEOUS

## 2012-12-06 MED ORDER — SODIUM CHLORIDE 0.9 % IV SOLN: 250.0000 mL | INTRAVENOUS | Status: DC | PRN

## 2012-12-06 MED ORDER — SODIUM CHLORIDE 0.9 % IJ SOLN
3.0000 mL | Freq: Two times a day (BID) | INTRAMUSCULAR | Status: DC
  Administered 2012-12-06 – 2012-12-12 (×7): 3 mL via INTRAVENOUS

## 2012-12-06 MED ORDER — CEFAZOLIN SODIUM-DEXTROSE 2-3 GM-% IV SOLR
2.0000 g | Freq: Three times a day (TID) | INTRAVENOUS | Status: DC
Start: 2012-12-06 — End: 2012-12-12
  Administered 2012-12-07 – 2012-12-12 (×17): 2 g via INTRAVENOUS
  Filled 2012-12-06 (×20): qty 50

## 2012-12-06 MED ORDER — FINASTERIDE 5 MG PO TABS
5.0000 mg | ORAL_TABLET | Freq: Every morning | ORAL | Status: DC
  Administered 2012-12-07 – 2012-12-12 (×6): 5 mg via ORAL
  Filled 2012-12-06 (×6): qty 1

## 2012-12-06 NOTE — ED Provider Notes (Signed)
CSN: [id number]     Arrival date & time 12/06/12  1828 History   First MD Initiated Contact with Patient 12/06/12 1854     Chief Complaint  Patient presents with  . Shortness of Breath  . Weakness  . Near Syncope    HPI Pt here with son with c/o of weakness, fatigue, SOB and fever. Hx of non hodgkin's lymphoma. States that he had a blood transfusion yesterday. Pt has external defibrillator. Denies pain  Past Medical History  Diagnosis Date  . Dyspnea   . 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   . Allergic rhinitis   . Hyperlipidemia   . Exogenous obesity   . Edema     venous insufficiency  . Diabetes mellitus 2012    T2DM  . Second degree Mobitz II AV block 12/11/08    with transient syncope, resolved s/p PPM  . Paroxysmal atrial fibrillation 03/16/11    diagnosed by PPM interrogation  . Pancytopenia   . Postural dizziness   . CHF (congestive heart failure)    Past Surgical History  Procedure Laterality Date  . Lymph node biopsy      groin  . Sternotomy  2000  . Pericardiectomy  2000  . Penile prosthesis implant      and removal  . Pacemaker insertion  12/11/08    MDT implanted by Dr Johney Frame  . Tee without cardioversion N/A 10/31/2012    Procedure: TRANSESOPHAGEAL ECHOCARDIOGRAM (TEE);  Surgeon: Lewayne Bunting, MD;  Location: [facility] ENDOSCOPY;  Service: Cardiovascular;  Laterality: N/A;  . Pacemaker lead removal Left 11/04/2012    Procedure: PACEMAKER LEAD REMOVAL;  Surgeon: Marinus Maw, MD;  Location: [facility] OR;  Service: Cardiovascular;  Laterality: Left;  . Temporary pacemaker insertion Left 11/04/2012    Procedure: TEMPORARY PACEMAKER INSERTION;  Surgeon: Marinus Maw, MD;  Location: [facility] OR;  Service: Cardiovascular;  Laterality: Left;  . Insert / replace / remove pacemaker     Family History  Problem Relation Age of Onset  . Diabetes Mother   . Liver cancer Brother   . Cancer Sister    History  Substance  Use Topics  . Smoking status: Former Smoker -- 1.00 packs/day for 30 years    Types: Cigarettes    Quit date: 01/27/1968  . Smokeless tobacco: Former Neurosurgeon  . Alcohol Use: No    Review of Systems All other systems reviewed and are negative Allergies  Review of patient's allergies indicates no known allergies.  Home Medications   No current outpatient prescriptions on file. BP 133/63  Pulse 70  Temp(Src) 98.2 F (36.8 C) (Oral)  Resp 20  Ht 5\' 11"  (1.803 m)  Wt 264 lb 1.8 oz (119.8 kg)  BMI 36.85 kg/m2  SpO2 97% Physical Exam  Nursing note and vitals reviewed. Constitutional: He is oriented to person, place, and time. He appears well-developed and well-nourished. No distress.  HENT:  Head: Normocephalic and atraumatic.  Eyes: Pupils are equal, round, and reactive to light.  Neck: Normal range of motion.  Cardiovascular: Normal rate and intact distal pulses.   Pulmonary/Chest: No respiratory distress.  Abdominal: Normal appearance. He exhibits no distension. There is no tenderness. There is no rebound.  Musculoskeletal: Normal range of motion. He exhibits edema (4+ pitting edema to the knees on both lower extremities).  Neurological: He is alert and oriented to person, place, and time. No cranial nerve deficit.  Skin: Skin  is warm and dry. No rash noted.  Psychiatric: He has a normal mood and affect. His behavior is normal.    ED Course  Procedures (including critical care time) Labs Review Labs Reviewed  COMPREHENSIVE METABOLIC PANEL - Abnormal; Notable for the following:    Sodium 120 (*)    Chloride 87 (*)    Glucose, Bld 113 (*)    GFR calc non Af Amer 90 (*)    All other components within normal limits  CBC WITH DIFFERENTIAL - Abnormal; Notable for the following:    WBC 1.8 (*)    RBC 3.00 (*)    Hemoglobin 10.4 (*)    HCT 29.1 (*)    MCH 34.7 (*)    RDW 22.9 (*)    Platelets 70 (*)    Neutro Abs 1.2 (*)    Lymphs Abs 0.5 (*)    Monocytes Relative 1 (*)     Monocytes Absolute 0.0 (*)    All other components within normal limits  PRO B NATRIURETIC PEPTIDE - Abnormal; Notable for the following:    Pro B Natriuretic peptide (BNP) 2518.0 (*)    All other components within normal limits  OSMOLALITY - Abnormal; Notable for the following:    Osmolality 254 (*)    All other components within normal limits  OSMOLALITY, URINE - Abnormal; Notable for the following:    Osmolality, Ur 218 (*)    All other components within normal limits  TSH - Abnormal; Notable for the following:    TSH 13.348 (*)    All other components within normal limits  URIC ACID - Abnormal; Notable for the following:    Uric Acid, Serum 2.3 (*)    All other components within normal limits  CBC - Abnormal; Notable for the following:    WBC 1.7 (*)    RBC 2.84 (*)    Hemoglobin 9.7 (*)    HCT 27.5 (*)    MCH 34.2 (*)    RDW 22.9 (*)    Platelets 61 (*)    All other components within normal limits  BASIC METABOLIC PANEL - Abnormal; Notable for the following:    Sodium 122 (*)    Chloride 89 (*)    Glucose, Bld 106 (*)    All other components within normal limits  BASIC METABOLIC PANEL - Abnormal; Notable for the following:    Sodium 123 (*)    Potassium 3.3 (*)    Chloride 86 (*)    Glucose, Bld 132 (*)    All other components within normal limits  BASIC METABOLIC PANEL - Abnormal; Notable for the following:    Sodium 121 (*)    Chloride 86 (*)    Glucose, Bld 153 (*)    GFR calc non Af Amer 87 (*)    All other components within normal limits  GLUCOSE, CAPILLARY - Abnormal; Notable for the following:    Glucose-Capillary 104 (*)    All other components within normal limits  GLUCOSE, CAPILLARY - Abnormal; Notable for the following:    Glucose-Capillary 100 (*)    All other components within normal limits  LACTIC ACID, PLASMA  SODIUM, URINE, RANDOM  URINALYSIS, ROUTINE W REFLEX MICROSCOPIC  BASIC METABOLIC PANEL  POCT I-STAT TROPONIN I   Imaging Review Dg  Chest 2 View  12/06/2012   CLINICAL DATA:  Shortness of breath. Congestive heart failure. Lymphoma.  EXAM: CHEST  2 VIEW  COMPARISON:  11/07/2012  FINDINGS: Cardiomegaly and chronic pulmonary vascular  congestion are stable. No evidence of acute infiltrate or edema. No evidence of pleural effusion.  Prior CABG noted. Single lead transvenous pacemaker remains in appropriate position. Right arm PICC line has been pulled back, with tip now overlying the right subclavian vein.  IMPRESSION: Stable cardiomegaly and pulmonary venous hypertension. No acute lung disease.  Abnormal right arm PICC line position with tip in the right subclavian vein.   Electronically Signed   By: Myles Rosenthal M.D.   On: 12/06/2012 19:46     Date: 12/06/2012  Rate: 70  Rhythm: Ventricular pacemaker  QRS Axis: Paced rhythm  ST/T Wave abnormalities: normal  Conduction Disutrbances: Paced  Narrative Interpretation: Abnormal EKG      MDM   1. Hyponatremia   2. Bacteremia due to Staphylococcus aureus   3. DM (diabetes mellitus), secondary uncontrolled   4. Other pancytopenia        Nelia Shi, MD 12/07/12 [phone]

## 2012-12-06 NOTE — Telephone Encounter (Signed)
Patient's son called to report that yesterday the pt received a 2 unit blood transfusion.  Earlier today, he apparently became weak and fell.  This was not witnessed.  He was apparently able to get back up on his feet within 15 mins but his son now says that he has a low grade fever @ 99.0 and he is very confused and somewhat dyspneic.  Given mental status changes, I recommended that they come into the ED for evaluation.  Pts son verbalized understanding.

## 2012-12-06 NOTE — ED Notes (Signed)
Pt here with son with c/o of weakness, fatigue, SOB and fever. Hx of non hodgkin's lymphoma. States that he had a blood transfusion yesterday. Pt has external defibrillator. Denies pain.

## 2012-12-06 NOTE — H&P (Signed)
PATIENT DETAILS Name: [name] Age: 79 y.o. Sex: male Date of Birth: 15-Jun-1932 Admit Date: 12/06/2012 ZOX:WRUEAVW Wert, MD  CHIEF COMPLAINT:  Weakness  HPI: [name] is a 79 y.o. male with a Past Medical History of recent MSSA bacteremia presumed secondary to infected pacemaker ( pacemaker explanted and has an external pacemaker in place) currently on Ancef therapy at home, history of follicular lymphoma in remission, chronic pancytopenia who presents today with the above noted complaint. History is obtained from the patient, son and daughter at bedside, ever since discharge from the hospital approximately a month ago patient has not been his usual self, he is much weaker than usual. However over the past 2-3 days, his weakness has worsened. He is also had worsening of his lower extremity edema. He normally weighs around 255 pounds, weight here in the emergency room was 264 pounds. Because of his worsening edema, he went to his oncologist's office yesterday and was given 2 units of blood. His weakness did not improve after transfusion, today he had a presyncopal event. He was then brought to the ED for further evaluation, where his sodium level was found to be 120. I was then asked to admit this patient for further evaluation and treatment. During my evaluation, patient seemed to be awake and alert, answers all my questions appropriately. He denies any history of nausea, vomiting or diarrhea. Per family, he has a very good oral intake and drinks approximately close to a gallon of water on a daily basis. Family has noticed that his leg edema has significantly worsened, he was told to take extra Lasix today-normally takes 20 mg of Lasix daily, he took another 20 mg tablet today as well.  ALLERGIES:  No Known Allergies  PAST MEDICAL HISTORY: Past Medical History  Diagnosis Date  . Dyspnea   . Malignant lymphoma of lymph nodes of inguinal region and lower limb April 2007     [place].   . Allergic rhinitis   . Hyperlipidemia   . Exogenous obesity   . Edema     venous insufficiency  . Diabetes mellitus 2012    T2DM  . Second degree Mobitz II AV block 12/11/08    with transient syncope, resolved s/p PPM  . Paroxysmal atrial fibrillation 03/16/11    diagnosed by PPM interrogation  . Pancytopenia   . Postural dizziness     PAST SURGICAL HISTORY: Past Surgical History  Procedure Laterality Date  . Lymph node biopsy      groin  . Sternotomy  2000  . Pericardiectomy  2000  . Penile prosthesis implant      and removal  . Pacemaker insertion  12/11/08    MDT implanted by Dr Johney Frame  . Tee without cardioversion N/A 10/31/2012    Procedure: TRANSESOPHAGEAL ECHOCARDIOGRAM (TEE);  [name]: Lewayne Bunting, MD;  Location: [facility] ENDOSCOPY;  Service: Cardiovascular;  Laterality: N/A;  . Pacemaker lead removal Left 11/04/2012    Procedure: PACEMAKER LEAD REMOVAL;  [name]: Marinus Maw, MD;  Location: [facility] OR;  Service: Cardiovascular;  Laterality: Left;  . Temporary pacemaker insertion Left 11/04/2012    Procedure: TEMPORARY PACEMAKER INSERTION;  [name]: Marinus Maw, MD;  Location: [facility] OR;  Service: Cardiovascular;  Laterality: Left;    MEDICATIONS AT HOME: Prior to Admission medications   Medication Sig Start Date End Date Taking? Authorizing Provider  acetaminophen (TYLENOL) 500 MG tablet Take 500-1,000 mg by mouth every 6 (six) hours as needed.    Yes Historical  Provider, MD  albuterol (PROVENTIL HFA;VENTOLIN HFA) 108 (90 BASE) MCG/ACT inhaler Inhale 2 puffs into the lungs every 4 (four) hours as needed for wheezing or shortness of breath.    Yes Historical Provider, MD  aspirin 81 MG tablet Take 81 mg by mouth every morning. (hold if bleeding)   Yes Historical Provider, MD  calcium-vitamin D (OS-CAL 500 + D) 500-200 MG-UNIT per tablet Take 1 tablet by mouth 2 (two) times daily.    Yes Historical Provider, MD  ceFAZolin (ANCEF) 2-3 GM-% SOLR Inject 50  mLs (2 g total) into the vein every 8 (eight) hours. For 6 weeks, STOP DATE 12/16/12 11/08/12  Yes Ripudeep Jenna Luo, MD  cetirizine (ZYRTEC) 10 MG tablet Take 10 mg by mouth at bedtime as needed for allergies.   Yes Historical Provider, MD  Cholecalciferol (VITAMIN D) 1000 UNITS capsule Take 1,000 Units by mouth 2 (two) times daily.     Yes Historical Provider, MD  dextromethorphan-guaiFENesin (MUCINEX DM) 30-600 MG per 12 hr tablet Take 1 tablet by mouth every 12 (twelve) hours as needed (cough).  06/26/10  Yes Tammy S Parrett, NP  escitalopram (LEXAPRO) 20 MG tablet Take 10 mg by mouth at bedtime.    Yes Historical Provider, MD  famotidine (PEPCID) 20 MG tablet Take 20 mg by mouth at bedtime.    Yes Historical Provider, MD  finasteride (PROSCAR) 5 MG tablet Take 5 mg by mouth every morning.    Yes Historical Provider, MD  furosemide (LASIX) 20 MG tablet Take 20 mg by mouth daily. May take 1 extra daily if needed for swelling in legs 07/12/12  Yes Nyoka Cowden, MD  hydrocortisone cream 1 % Apply 1 application topically 2 (two) times daily as needed (itchy rash).    Yes Historical Provider, MD  metFORMIN (GLUCOPHAGE) 500 MG tablet Take 500 mg by mouth 2 (two) times daily with a meal.  06/23/11  Yes Tammy S Parrett, NP  methylcellulose (CITRUCEL) oral powder 1 scoop in 8oz of water at bedtime 06/26/10  Yes Tammy S Parrett, NP  mometasone (NASONEX) 50 MCG/ACT nasal spray Place 2 sprays into the nose 2 (two) times daily.  02/23/11  Yes Tammy S Parrett, NP  mometasone-formoterol (DULERA) 200-5 MCG/ACT AERO Inhale 2 puffs into the lungs 2 (two) times daily.   Yes Historical Provider, MD  Multiple Vitamin (MULTIVITAMIN) tablet Take 1 tablet by mouth daily.     Yes Historical Provider, MD  omeprazole (PRILOSEC) 20 MG capsule Take 40 mg by mouth daily before breakfast.    Yes Historical Provider, MD  oxybutynin (DITROPAN) 5 MG tablet Take 10 mg by mouth every morning.    Yes Historical Provider, MD  polyethylene  glycol powder (MIRALAX) powder Take 17 g by mouth at bedtime.    Yes Historical Provider, MD  potassium chloride SA (K-DUR,KLOR-CON) 20 MEQ tablet Take 20 mEq by mouth daily.   Yes Historical Provider, MD  sodium chloride 0.9 % injection 10-40 mLs by Intracatheter route as needed (flush). 11/08/12  Yes Ripudeep Jenna Luo, MD  sulindac (CLINORIL) 200 MG tablet Take 200 mg by mouth 2 (two) times daily as needed (pain).  06/24/12  Yes Nyoka Cowden, MD  traMADol (ULTRAM) 50 MG tablet Take 50-100 mg by mouth every 6 (six) hours as needed for pain (cough).   Yes Historical Provider, MD  traZODone (DESYREL) 50 MG tablet Take 50 mg by mouth at bedtime.   Yes Historical Provider, MD    FAMILY HISTORY:  Family History  Problem Relation Age of Onset  . Diabetes Mother   . Liver cancer Brother   . Cancer Sister     SOCIAL HISTORY:  reports that he quit smoking about 44 years ago. His smoking use included Cigarettes. He has a 30 pack-year smoking history. He has quit using smokeless tobacco. He reports that he does not drink alcohol or use illicit drugs.  REVIEW OF SYSTEMS:  Constitutional:   No  weight loss, night sweats,  Fevers, chills HEENT:    No headaches, Difficulty swallowing,Tooth/dental problems,Sore throat,  No sneezing, itching, ear ache, nasal congestion, post nasal drip,   Cardio-vascular: No chest pain,  Orthopnea, PND,  anasarca, dizziness, palpitations  GI:  No heartburn, indigestion, abdominal pain, nausea, vomiting, diarrhea, change in  bowel habits, loss of appetite  Resp: No shortness of breath rest.  No excess mucus, no productive cough, No non-productive cough,  No coughing up of blood.No change in color of mucus.No wheezing.No chest wall deformity  Skin:  no rash or lesions.  GU:  no dysuria, change in color of urine, no urgency or frequency.  No flank pain.  Musculoskeletal: No joint pain or swelling.  No decreased range of motion.  No back pain.  Psych: No change  in mood or affect. No depression or anxiety.  No memory loss.   PHYSICAL EXAM: Blood pressure 143/70, pulse 69, temperature 98.1 F (36.7 C), temperature source Oral, resp. rate 24, weight 120.067 kg (264 lb 11.2 oz), SpO2 97.00%.  General appearance :Awake, alert, not in any distress. Speech Clear. Not toxic Looking HEENT: Atraumatic and Normocephalic, pupils equally reactive to light and accomodation Neck: supple, no JVD. No cervical lymphadenopathy.  Chest:Good air entry bilaterally, no added sounds  CVS: S1 S2 regular, no murmurs. External pacemaker in place, site looks okay. Abdomen: Bowel sounds present, Non tender and not distended with no gaurding, rigidity or rebound. Extremities: B/L Lower Ext shows 3+ pitting edema- almost up to his knees, both legs are warm to touch Neurology: Awake alert, and oriented X 3, CN II-XII intact, Non focal Skin:No Rash Wounds:N/A  LABS ON ADMISSION:   Recent Labs  12/06/12 1850  NA 120*  K 4.1  CL 87*  CO2 24  GLUCOSE 113*  BUN 12  CREATININE 0.67  CALCIUM 9.4    Recent Labs  12/06/12 1850  AST 17  ALT <5  ALKPHOS 82  BILITOT 0.7  PROT 6.5  ALBUMIN 3.5   No results found for this basename: LIPASE, AMYLASE,  in the last 72 hours  Recent Labs  12/05/12 1130 12/06/12 1850  WBC 1.1* 1.8*  NEUTROABS 0.6* 1.2*  HGB 8.3* 10.4*  HCT 24.5* 29.1*  MCV 104.1* 97.0  PLT 64* 70*   No results found for this basename: CKTOTAL, CKMB, CKMBINDEX, TROPONINI,  in the last 72 hours No results found for this basename: DDIMER,  in the last 72 hours No components found with this basename: POCBNP,    RADIOLOGIC STUDIES ON ADMISSION: Dg Chest 2 View  12/06/2012   CLINICAL DATA:  Shortness of breath. Congestive heart failure. Lymphoma.  EXAM: CHEST  2 VIEW  COMPARISON:  11/07/2012  FINDINGS: Cardiomegaly and chronic pulmonary vascular congestion are stable. No evidence of acute infiltrate or edema. No evidence of pleural effusion.  Prior  CABG noted. Single lead transvenous pacemaker remains in appropriate position. Right arm PICC line has been pulled back, with tip now overlying the right subclavian vein.  IMPRESSION: Stable cardiomegaly and  pulmonary venous hypertension. No acute lung disease.  Abnormal right arm PICC line position with tip in the right subclavian vein.   Electronically Signed   By: Myles Rosenthal M.D.   On: 12/06/2012 19:46    ASSESSMENT AND PLAN: Present on Admission:  . Hyponatremia - Clinically patient has hypervolemic hyponatremia,as he has evidence of significant edema in his lower extremities. He is not orthostatic, and has no clinical features of dehydration at all. As noted above, clinically he is edematous. I reviewed his prior records, he does not have a history of congestive heart failure, he does have a history of chronic edema that has been attributed to chronic venous disease and dependent edema. I suspect worsening edema may be from diastolic heart failure, however would check a urine analysis to make sure no proteinuria. LFTs does not suggest any liver issue. In any event, patient will be admitted, placed on intravenous Lasix 40 mg twice a day, fluid restriction of 1200 cc, chemistries will be checked every 6 hours for the next 24 hours, strict I&O's and daily weights will be ordered.  - He takes Lexapro and trazodone, both of them will be discontinued for now. - I have ordered serum osmolality, urine osmolality, TSH, urine sodium and a uric acid-all of these are currently pending. - Will need to follow his clinical progress, chemistries, if sodium levels were to decrease any further, nephrology consultation will need to be obtained, I would defer this to be rounding physician in the morning.  Recent MSSA bacteremia secondary to infected pacemaker-explanted and has a external pacemaker in place - Continue cefazolin, reviewed infectious disease note, current plans are to continue with Cefzil until  11/21.  [name] Second degree AV block, Mobitz type II - External pacemaker in place - Patient follows up with Dr. Sharrell Ku of [place] cardiology-please consult if needed.  . DM (diabetes mellitus), secondary uncontrolled - Will stop metformin, place on sliding scale insulin while inpatient   . Essential hypertension, benign - Will be on Lasix, please monitor blood pressure and adjust accordingly   . GERD - Stable, continue PPI   . Pancytopenia - Chronic issue, recently-i.e. yesterday had 2 units PRBC transfusion  - Follows with Dr. Clelia Croft of the oncology service.  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   [name] LYMPHOMA NEC, MLIG, INGUINAL/LOWER LIMB -Follicular lymphoma as a grade 3, stage III, diagnosed 2007, continued to be in remission- per last oncology note.  Further plan will depend as patient's clinical course evolves and further radiologic and laboratory data become available. Patient will be monitored closely.   DVT Prophylaxis: SCD's  Code Status: Full Code  Total time spent for admission equals 45 minutes.  [facility] Pager [phone]  If 7PM-7AM, please contact night-coverage [email or web address] Password [facility] 12/06/2012, 10:27 PM

## 2012-12-07 ENCOUNTER — Telehealth: Payer: Self-pay | Admitting: Internal Medicine

## 2012-12-07 DIAGNOSIS — R609 Edema, unspecified: Secondary | ICD-10-CM

## 2012-12-07 LAB — BASIC METABOLIC PANEL
BUN: 11 mg/dL (ref 6–23)
BUN: 10 mg/dL (ref 6–23)
BUN: 11 mg/dL (ref 6–23)
CO2: 26 mEq/L (ref 19–32)
CO2: 28 mEq/L (ref 19–32)
CO2: 25 mEq/L (ref 19–32)
CO2: 26 mEq/L (ref 19–32)
Calcium: 9.1 mg/dL (ref 8.4–10.5)
Chloride: 89 mEq/L — ABNORMAL LOW (ref 96–112)
Chloride: 86 mEq/L — ABNORMAL LOW (ref 96–112)
Chloride: 86 mEq/L — ABNORMAL LOW (ref 96–112)
Chloride: 87 mEq/L — ABNORMAL LOW (ref 96–112)
Creatinine, Ser: 0.64 mg/dL (ref 0.50–1.35)
Creatinine, Ser: 0.73 mg/dL (ref 0.50–1.35)
GFR calc Af Amer: >90 mL/min (ref >90)
GFR calc Af Amer: >90 mL/min (ref >90)
GFR calc Af Amer: >90 mL/min (ref >90)
GFR calc non Af Amer: 87 mL/min — ABNORMAL LOW (ref >90)
GFR calc non Af Amer: 86 mL/min — ABNORMAL LOW (ref >90)
Glucose, Bld: 132 mg/dL — ABNORMAL HIGH (ref 70–99)
Glucose, Bld: 153 mg/dL — ABNORMAL HIGH (ref 70–99)
Potassium: 3.7 mEq/L (ref 3.5–5.1)
Potassium: 3.3 mEq/L — ABNORMAL LOW (ref 3.5–5.1)
Potassium: 3.7 mEq/L (ref 3.5–5.1)
Potassium: 3.6 mEq/L (ref 3.5–5.1)
Sodium: 122 mEq/L — ABNORMAL LOW (ref 135–145)
Sodium: 123 mEq/L — ABNORMAL LOW (ref 135–145)
Sodium: 121 mEq/L — ABNORMAL LOW (ref 135–145)

## 2012-12-07 LAB — OSMOLALITY, URINE: Osmolality, Ur: 218 mOsm/kg — ABNORMAL LOW (ref 390–1090)

## 2012-12-07 LAB — GLUCOSE, CAPILLARY
Glucose-Capillary: 113 mg/dL — ABNORMAL HIGH (ref 70–99)
Glucose-Capillary: 109 mg/dL — ABNORMAL HIGH (ref 70–99)

## 2012-12-07 LAB — CBC
HCT: 27.5 % — ABNORMAL LOW (ref 39.0–52.0)
MCHC: 35.3 g/dL (ref 30.0–36.0)
RDW: 22.9 % — ABNORMAL HIGH (ref 11.5–15.5)

## 2012-12-07 MED ORDER — FUROSEMIDE 10 MG/ML IJ SOLN
40.0000 mg | Freq: Two times a day (BID) | INTRAMUSCULAR | Status: DC
  Administered 2012-12-07 – 2012-12-10 (×7): 40 mg via INTRAVENOUS
  Filled 2012-12-07 (×7): qty 4

## 2012-12-07 MED ORDER — POLYETHYLENE GLYCOL 3350 17 G PO PACK
17.0000 g | PACK | Freq: Every day | ORAL | Status: DC
  Administered 2012-12-07 – 2012-12-11 (×5): 17 g via ORAL
  Filled 2012-12-07 (×7): qty 1

## 2012-12-07 NOTE — Evaluation (Signed)
I have reviewed this note and agree with all findings. Kati Ramon Brant, PT, DPT Pager: [phone]   

## 2012-12-07 NOTE — Progress Notes (Signed)
Pt states "I felt weak and floated down to floor", he is pleasant and states he does not want to say the word "fall". He states the fall was on carpet and he was not hurt. He states he has been getting around his house fairly well until yesterday, and he has someone stay with him during the day.

## 2012-12-07 NOTE — Progress Notes (Signed)
[facility]  Patient Status: Active (receiving services up to time of hospitalization)  [facility] is providing the following services: RN, IV abx, PT  If patient discharges after hours, please call [phone].   [name] 12/07/2012, 12:30 PM

## 2012-12-07 NOTE — Care Management Note (Addendum)
    Page 1 of 2   12/12/2012     11:29:29 AM   CARE MANAGEMENT NOTE 12/12/2012  Patient:  [name], [name]   Account Number:  [id number]  Date Initiated:  12/07/2012  Documentation initiated by:  Lanier Clam  Subjective/Objective Assessment:   79 Y/O M ADMITTED W/HYPONATREMIA.READMIT 10/2-10/14-[facility] BACTEREMIA.ZO:XWRUEAVW.     Action/Plan:   FROM HOME,ALONE.ACTIVE W/[facility] HHRN-IV ABX/PT.HAS PCP,PHARMACY.   Anticipated DC Date:  12/12/2012   Anticipated DC Plan:  HOME W HOME HEALTH SERVICES      DC Planning Services  CM consult      [facility] Choice  Resumption Of Svcs/PTA Provider   Choice offered to / List presented to:  C-1 Patient        HH arranged  HH-1 RN  HH-2 PT  IV Antibiotics      HH agency  [facility]   Status of service:  Completed, signed off Medicare Important Message given?   (If response is "NO", the following Medicare IM given date fields will be blank) Date Medicare IM given:   Date Additional Medicare IM given:    Discharge Disposition:  HOME W HOME HEALTH SERVICES  Per UR Regulation:  Reviewed for med. necessity/level of care/duration of stay  If discussed at Long Length of Stay Meetings, dates discussed:    Comments:  12/12/12 Noe Goyer RN,BSN NCM [phone] [facility] K[facility]STEN AWARE OF HHRN-IV ABX LONG TERM ALREADY HAS ORDERS,HHPT ALREADY ORDERED ALSO.  12/10/2012 1200 NCM contacted [facility] to make aware of scheduled dc for 12/11/2012 with IV abx. Daryl Eastern RN CCM Case Mgmt phone [phone]  12/08/12 Obrian Bulson RN,BSN NCM [phone] FOR PICC.LIKELY NEED LONG TERM IV ABX.[facility] K[facility]STEN ALREADY FOLLOWING.AWAITING FINAL HH ORDERS.WILL SPECIFIC IV ABX MED DOSE/ DURATION FOR HOME IV ABX/IF LABS NEED TO BE DRAWN.  12/07/12 Jamayah Myszka RN,BSN NCM [phone] [facility] K[facility]ST[facility] REP AWARE OF READMIT & FOLLOWING FOR HHC.AWAIT FINAL HH ORDERS.

## 2012-12-07 NOTE — Progress Notes (Addendum)
[facility] PROGRESS NOTE  [name] BMW:[id number] DOB: 22-Apr-1932 DOA: 12/06/2012 PCP: Victor Hughs, MD  Assessment/Plan: Hyponatremia  - Clinically patient has hypervolemic hyponatremia,as he has evidence of significant edema in his lower extremities. He is not orthostatic, and has no clinical features of dehydration at all. As noted above, clinically he is edematous.  -history of chronic edema that has been attributed to chronic venous disease and dependent edema. I suspect worsening edema may be from diastolic heart failure -Urinalysis negative for proteinuria -LFTs does not suggest any liver issue. In any event, patient will be admitted, placed on intravenous Lasix 40 mg twice a day, fluid restriction of 1200 cc, chemistries will be checked every 6 hours for the next 24 hours, strict I&O's and daily weights will be ordered.  - He takes Lexapro and trazodone, both of them will be discontinued for now.  -serum osmolality--254, urine 218,  -TSH--13.34 -check free T4 - BMP in am -nephrology consult if Na without improvement  Recent MSSA bacteremia secondary to 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  [name] Second degree AV block, Mobitz type II  - External pacemaker in place  - Patient follows up with [name] of [facility] cardiology  . DM (diabetes mellitus), secondary uncontrolled  - Will stop metformin, place on sliding scale insulin while inpatient  -CBGs controlled . Essential hypertension, benign  - Will be on Lasix -monitor blood pressure and adjust accordingly   . GERD  - Stable, continue PPI   . Pancytopenia  - Chronic issue, recently-i.e. 11/10--had 2 units PRBC transfusion  - Follows with [name] of the oncology service.  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   [name] LYMPHOMA NEC,  MLIG, INGUINAL/LOWER LIMB  -Follicular lymphoma as a grade 3, stage III, diagnosed 2007, continued to be in remission- per last oncology note.   Family Communication:   [name] at beside Disposition Plan:   Home when medically stable      Procedures/Studies: Dg Chest 2 View  12/06/2012   CLINICAL DATA:  Shortness of breath. Congestive heart failure. Lymphoma.  EXAM: CHEST  2 VIEW  COMPARISON:  11/07/2012  FINDINGS: Cardiomegaly and chronic pulmonary vascular congestion are stable. No evidence of acute infiltrate or edema. No evidence of pleural effusion.  Prior CABG noted. Single lead transvenous pacemaker remains in appropriate position. Right arm PICC line has been pulled back, with tip now overlying the right subclavian vein.  IMPRESSION: Stable cardiomegaly and pulmonary venous hypertension. No acute lung disease.  Abnormal right arm PICC line position with tip in the right subclavian vein.   Electronically Signed   By: [name] M.D.   On: 12/06/2012 19:46         Subjective: Patient feels stronger than yesterday. Denies fevers, chills, chest discomfort, shortness breath, nausea, vomiting, diarrhea, abdominal pain.  Objective: Filed Vitals:   12/06/12 2210 12/07/12 0000 12/07/12 0538 12/07/12 1326  BP:  154/77 125/87 133/63  Pulse:  70 70 70  Temp:  98 F (36.7 C) 97.8 F (36.6 C) 98.2 F (36.8 C)  TempSrc:  Oral Oral Oral  Resp:  20 22 20   Height:  5\' 11"  (1.803 m)    Weight: 120.067 kg (264 lb 11.2 oz) 119.8 kg (264 lb 1.8 oz) 119.8 kg (264 lb 1.8 oz)   SpO2:  98% 95% 97%  Intake/Output Summary (Last 24 hours) at 12/07/12 2007 Last data filed at 12/07/12 1300  Gross per 24 hour  Intake    410 ml  Output   3550 ml  Net  -3140 ml   Weight change:  Exam:   General:  Pt is alert, follows commands appropriately, not in acute distress  HEENT: No icterus, No thrush,  [place]/AT  Cardiovascular: RRR, S1/S2, no rubs, no gallops  Respiratory: CTA bilaterally, no  wheezing, no crackles, no rhonchi  Abdomen: Soft/+BS, non tender, non distended, no guarding  Extremities: 2+ LE edema, No lymphangitis, No petechiae, No rashes, no synovitis  Data Reviewed: Basic Metabolic Panel:  Recent Labs Lab 12/06/12 1850 12/07/12 0147 12/07/12 0824 12/07/12 1333  NA 120* 122* 123* 121*  K 4.1 3.7 3.3* 3.7  CL 87* 89* 86* 86*  CO2 24 26 28 25   GLUCOSE 113* 106* 132* 153*  BUN 12 11 10 10   CREATININE 0.67 0.66 0.64 0.73  CALCIUM 9.4 9.4 9.1 9.5   Liver Function Tests:  Recent Labs Lab 12/06/12 1850  AST 17  ALT <5  ALKPHOS 82  BILITOT 0.7  PROT 6.5  ALBUMIN 3.5   No results found for this basename: LIPASE, AMYLASE,  in the last 168 hours No results found for this basename: AMMONIA,  in the last 168 hours CBC:  Recent Labs Lab 12/05/12 1130 12/06/12 1850 12/07/12 0147  WBC 1.1* 1.8* 1.7*  NEUTROABS 0.6* 1.2*  --   HGB 8.3* 10.4* 9.7*  HCT 24.5* 29.1* 27.5*  MCV 104.1* 97.0 96.8  PLT 64* 70* 61*   Cardiac Enzymes: No results found for this basename: CKTOTAL, CKMB, CKMBINDEX, TROPONINI,  in the last 168 hours BNP: No components found with this basename: POCBNP,  CBG:  Recent Labs Lab 12/07/12 0745 12/07/12 1152 12/07/12 1649  GLUCAP 104* 100* 113*    No results found for this or any previous visit (from the past 240 hour(s)).   Scheduled Meds: . aspirin  81 mg Oral q morning - 10a  . calcium-vitamin D  1 tablet Oral BID  . ceFAZolin  2 g Intravenous Q8H  . cholecalciferol  1,000 Units Oral BID  . famotidine  20 mg Oral QHS  . finasteride  5 mg Oral q morning - 10a  . fluticasone  1 spray Each Nare Daily  . furosemide  40 mg Intravenous Q12H  . insulin aspart  0-9 Units Subcutaneous TID WC  . mometasone-formoterol  2 puff Inhalation BID  . multivitamin with minerals  1 tablet Oral Daily  . oxybutynin  10 mg Oral q morning - 10a  . pantoprazole  40 mg Oral Daily  . polyethylene glycol  17 g Oral QHS  . potassium  chloride SA  20 mEq Oral Daily  . sodium chloride  3 mL Intravenous Q12H  . sodium chloride  3 mL Intravenous Q12H   Continuous Infusions:    Victor Raczkowski, DO  [facility] Pager [phone]  If 7PM-7AM, please contact night-coverage [email or web address] Password [email or web address] 12/07/2012, 8:07 PM   LOS: 1 day

## 2012-12-07 NOTE — Telephone Encounter (Signed)
New Problem:  Pt's daughter states her father is at [employer] in room 1407. Pt's daughter was wondering if Dr. Ladona Ridgel would come see him in the hospital since he had to cancel his appt for tomorrow. Please advise

## 2012-12-07 NOTE — Evaluation (Signed)
Physical Therapy Evaluation Patient Details Name: [name] MRN: [id number] DOB: Feb 09, 1932 Today's Date: 12/07/2012 Time: 0865-7846 PT Time Calculation (min): 15 min  PT Assessment / Plan / Recommendation History of Present Illness  Pt is a 79 y.o. male with a Past Medical History of recent MSSA bacteremia presumed secondary to infected pacemaker ( pacemaker explanted and has an external pacemaker in place) currently on Ancef therapy at home, history of follicular lymphoma in remission, chronic pancytopenia who presents today with the above noted complaint. History is obtained from the patient, son and daughter at bedside, ever since discharge from the hospital approximately a month ago patient has not been his usual self, he is much weaker than usual. However over the past 2-3 days, his weakness has worsened. He is also had worsening of his lower extremity edema. He normally weighs around 255 pounds, weight here in the emergency room was 264 pounds. Because of his worsening edema, he went to his oncologist's office yesterday and was given 2 units of blood. His weakness did not improve after transfusion, today he had a presyncopal event.  Clinical Impression  Pt admitted for the above. Pt currently presenting with functional limitations due to deficits listed below (see PT Problem List). Pt would benefit from skilled PT to increase independence and safety during ambulation. Pt able to ambulate 100' with RW and min guard today, pt limited by fatigue. Pt reports daughter is at his house on most days and later reported daughter stays with pt on most night, need further clarification on how often he has assist at home.  PT recommends HHPT to improve strength, endurance, and safety during mobility. PT educated pt on using rollator at all times to ensure safety and decrease risk of falls, pt states he does not care for RW or rollator.    PT Assessment  Patient needs continued PT services    Follow  Up Recommendations  Home health PT;Supervision for mobility/OOB    Does the patient have the potential to tolerate intense rehabilitation      Barriers to Discharge        Equipment Recommendations  None recommended by PT    Recommendations for Other Services     Frequency Min 3X/week    Precautions / Restrictions Precautions Precautions: Fall Restrictions Weight Bearing Restrictions: No   Pertinent Vitals/Pain No reports of pain/SOB during session.      Mobility  Bed Mobility Bed Mobility: Not assessed Details for Bed Mobility Assistance: pt sitting up in chair upon arrival. Transfers Transfers: Sit to Stand;Stand to Sit Sit to Stand: 4: Min guard;With upper extremity assist;With armrests;From chair/3-in-1 Stand to Sit: 4: Min guard;With upper extremity assist;With armrests;To chair/3-in-1 Details for Transfer Assistance: min guard to ensure safety and VC's for hand placement. Ambulation/Gait Ambulation/Gait Assistance: 4: Min guard Ambulation Distance (Feet): 100 Feet Assistive device: Rolling walker Ambulation/Gait Assistance Details: min guard to ensure safety and VC's to stay close to RW. Pt required standing rest break halfway through ambulation due to fatigue. Gait Pattern: Decreased dorsiflexion - right;Decreased dorsiflexion - left;Decreased stride length;Step-through pattern;Trunk flexed Gait velocity: decreased    Exercises     PT Diagnosis: Difficulty walking;Generalized weakness  PT Problem List: Decreased strength;Decreased activity tolerance;Decreased mobility;Decreased knowledge of use of DME PT Treatment Interventions: DME instruction;Gait training;Functional mobility training;Therapeutic activities;Therapeutic exercise;Patient/family education     PT Goals(Current goals can be found in the care plan section) Acute Rehab PT Goals Patient Stated Goal: to go home and not be dependent  on a walker PT Goal Formulation: With patient Time For Goal  Achievement: 12/21/12 Potential to Achieve Goals: Good  Visit Information  Last PT Received On: 12/07/12 Assistance Needed: +1 History of Present Illness: Pt is a 79 y.o. male with a Past Medical History of recent MSSA bacteremia presumed secondary to infected pacemaker ( pacemaker explanted and has an external pacemaker in place) currently on Ancef therapy at home, history of follicular lymphoma in remission, chronic pancytopenia who presents today with the above noted complaint. History is obtained from the patient, son and daughter at bedside, ever since discharge from the hospital approximately a month ago patient has not been his usual self, he is much weaker than usual. However over the past 2-3 days, his weakness has worsened. He is also had worsening of his lower extremity edema. He normally weighs around 255 pounds, weight here in the emergency room was 264 pounds. Because of his worsening edema, he went to his oncologist's office yesterday and was given 2 units of blood. His weakness did not improve after transfusion, today he had a presyncopal event.       Prior Functioning  Home Living Family/patient expects to be discharged to:: Private residence Living Arrangements: Children (daughter is home most nights) Available Help at Discharge: Family Type of Home: House Home Access: Level entry Home Layout: One level Home Equipment: Environmental consultant - 4 wheels;Cane - single point Additional Comments: pt reports he uses SPC but not rollator during ambulation Prior Function Level of Independence: Independent with assistive device(s) Comments: pt reports he is ind. in all ADLs. Communication Communication: No difficulties    Cognition  Cognition Arousal/Alertness: Awake/alert Behavior During Therapy: WFL for tasks assessed/performed Overall Cognitive Status: Within Functional Limits for tasks assessed    Extremity/Trunk Assessment Lower Extremity Assessment Lower Extremity Assessment:  Generalized weakness   Balance    End of Session PT - End of Session Activity Tolerance: Patient limited by fatigue Patient left: in chair;with call bell/phone within reach;with chair alarm set Nurse Communication: Mobility status  GP     Sol Blazing 12/07/2012, 12:13 PM

## 2012-12-07 NOTE — Telephone Encounter (Signed)
Trish aware and I spoke with the patient's daughter and let her know i did not know if Dr Ladona Ridgel would get over as he is in cases but that he would be put on the [facility] card for a MD to see at [facility]

## 2012-12-08 ENCOUNTER — Encounter: Payer: Medicare Other | Admitting: Internal Medicine

## 2012-12-08 ENCOUNTER — Inpatient Hospital Stay (HOSPITAL_COMMUNITY): Payer: Medicare Other

## 2012-12-08 DIAGNOSIS — Z5189 Encounter for other specified aftercare: Secondary | ICD-10-CM

## 2012-12-08 DIAGNOSIS — I5033 Acute on chronic diastolic (congestive) heart failure: Secondary | ICD-10-CM

## 2012-12-08 LAB — CBC
HCT: 27 % — ABNORMAL LOW (ref 39.0–52.0)
MCH: 34.2 pg — ABNORMAL HIGH (ref 26.0–34.0)
MCHC: 35.2 g/dL (ref 30.0–36.0)
MCV: 97.1 fL (ref 78.0–100.0)
Platelets: 66 10*3/uL — ABNORMAL LOW (ref 150–400)
RDW: 22.5 % — ABNORMAL HIGH (ref 11.5–15.5)

## 2012-12-08 LAB — BASIC METABOLIC PANEL
BUN: 11 mg/dL (ref 6–23)
Chloride: 88 mEq/L — ABNORMAL LOW (ref 96–112)
Creatinine, Ser: 0.76 mg/dL (ref 0.50–1.35)
GFR calc Af Amer: >90 mL/min (ref >90)
Glucose, Bld: 108 mg/dL — ABNORMAL HIGH (ref 70–99)
Potassium: 3.5 mEq/L (ref 3.5–5.1)
Sodium: 126 mEq/L — ABNORMAL LOW (ref 135–145)

## 2012-12-08 LAB — GLUCOSE, CAPILLARY: Glucose-Capillary: 109 mg/dL — ABNORMAL HIGH (ref 70–99)

## 2012-12-08 MED ORDER — LIDOCAINE HCL 1 % IJ SOLN
INTRAMUSCULAR | Status: AC
  Filled 2012-12-08: qty 20

## 2012-12-08 NOTE — Progress Notes (Signed)
[facility] PROGRESS NOTE  [name] WJX:[id number] DOB: Jun 14, 1932 DOA: 12/06/2012 PCP: Sandrea Hughs, MD  Assessment/Plan: Hyponatremia  - Clinically patient has hypervolemic hyponatremia -He is not orthostatic, and has no clinical features of dehydration  -history of chronic edema that has been attributed to chronic venous disease and dependent edema -suspect worsening edema may be from diastolic heart failure  -Urinalysis negative for proteinuria  -LFTs does not suggest any liver issue. In any event, patient will be admitted, placed on intravenous Lasix 40 mg twice a day, fluid restriction of 1200 cc, chemistries will be checked every 6 hours for the next 24 hours, strict I&O's and daily weights will be ordered.  - He takes Lexapro and trazodone, both of them will be discontinued for now.  -serum osmolality--254, urine 218,  -TSH--13.34  -check free T4--0.88  - BMP in am  -nephrology consult if Na without improvement  Acute on chronic diastolic CHF -10/31/2012 EF 55-60 -Continue intravenous furosemide 40 mg IV twice a day -Serum sodium is improving with diuresis -neg 5077cc for the admission -Daily weights Recent MSSA bacteremia secondary to 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  [name] Second degree AV block, Mobitz type II  - External pacemaker in place  - Patient follows up with Dr. Sharrell Ku of [facility] cardiology   . DM (diabetes mellitus), secondary uncontrolled  - Will stop metformin, place on sliding scale insulin while inpatient  -CBGs controlled . Essential hypertension, benign  - Will be on Lasix  -monitor blood pressure and adjust accordingly  Euthyroid sick syndrome  -Recheck TSH and free T4 in 4-6 weeks  . GERD  - Stable, continue PPI   . Pancytopenia  - Chronic issue, recently-i.e. 11/10--had 2 units PRBC  transfusion  - Follows with Dr. Clelia Croft of the oncology service.  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   [name] LYMPHOMA NEC, MLIG, INGUINAL/LOWER LIMB  -Follicular lymphoma as a grade 3, stage III, diagnosed 2007, continued to be in remission- per last oncology note.  Family Communication: Son at beside  Disposition Plan: Home when medically stable       Procedures/Studies: Dg Chest 2 View  12/06/2012   CLINICAL DATA:  Shortness of breath. Congestive heart failure. Lymphoma.  EXAM: CHEST  2 VIEW  COMPARISON:  11/07/2012  FINDINGS: Cardiomegaly and chronic pulmonary vascular congestion are stable. No evidence of acute infiltrate or edema. No evidence of pleural effusion.  Prior CABG noted. Single lead transvenous pacemaker remains in appropriate position. Right arm PICC line has been pulled back, with tip now overlying the right subclavian vein.  IMPRESSION: Stable cardiomegaly and pulmonary venous hypertension. No acute lung disease.  Abnormal right arm PICC line position with tip in the right subclavian vein.   Electronically Signed   By: Myles Rosenthal M.D.   On: 12/06/2012 19:46   Ir Fluoro Guide Cv Line Right  12/08/2012   CLINICAL DATA:  Recent MSSA bacteremia. Previously placed PICC line has been inadvertently removed. Request for new PICC line placement  EXAM: IR RIGHT FLUORO GUIDE CV LINE; IR ULTRASOUND GUIDANCE VASC ACCESS RIGHT  TECHNIQUE: The right arm was prepped with chlorhexidine, draped in the usual sterile fashion using maximum barrier technique (cap and mask, sterile gown, sterile gloves, large sterile sheet, hand hygiene and cutaneous antiseptic). Local anesthesia was attained by infiltration with 1% lidocaine.  Ultrasound demonstrated  patency of the basilic vein, and this was documented with an image. Under real-time ultrasound guidance, this vein was accessed with a 21 gauge micropuncture needle and image documentation was performed. The needle was  exchanged over a guidewire for a peel-away sheath through which a 47 cm 5 [place] single lumen power injectable PICC was advanced, and positioned with its tip at the lower SVC/right atrial junction. Fluoroscopy during the procedure and fluoro spot radiograph confirms appropriate catheter position. The catheter was flushed, secured to the skin with Prolene sutures, and covered with a sterile dressing.  FLUOROSCOPY TIME:  1 min, 48 seconds.  COMPLICATIONS: None.  The patient tolerated the procedure well.  IMPRESSION: Successful placement of a right arm PICC with sonographic and fluoroscopic guidance. The catheter is ready for use.  Read by: Brayton El PA-C   Electronically Signed   By: Malachy Moan M.D.   On: 12/08/2012 11:12   Ir US Guide Vasc Access Right  12/08/2012   CLINICAL DATA:  Recent MSSA bacteremia. Previously placed PICC line has been inadvertently removed. Request for new PICC line placement  EXAM: IR RIGHT FLUORO GUIDE CV LINE; IR ULTRASOUND GUIDANCE VASC ACCESS RIGHT  TECHNIQUE: The right arm was prepped with chlorhexidine, draped in the usual sterile fashion using maximum barrier technique (cap and mask, sterile gown, sterile gloves, large sterile sheet, hand hygiene and cutaneous antiseptic). Local anesthesia was attained by infiltration with 1% lidocaine.  Ultrasound demonstrated patency of the basilic vein, and this was documented with an image. Under real-time ultrasound guidance, this vein was accessed with a 21 gauge micropuncture needle and image documentation was performed. The needle was exchanged over a guidewire for a peel-away sheath through which a 47 cm 5 [place] single lumen power injectable PICC was advanced, and positioned with its tip at the lower SVC/right atrial junction. Fluoroscopy during the procedure and fluoro spot radiograph confirms appropriate catheter position. The catheter was flushed, secured to the skin with Prolene sutures, and covered with a sterile dressing.   FLUOROSCOPY TIME:  1 min, 48 seconds.  COMPLICATIONS: None.  The patient tolerated the procedure well.  IMPRESSION: Successful placement of a right arm PICC with sonographic and fluoroscopic guidance. The catheter is ready for use.  Read by: Brayton El PA-C   Electronically Signed   By: Malachy Moan M.D.   On: 12/08/2012 11:12         Subjective: Patient continues to feel better. He feels stronger. Denies fevers, chills, chest discomfort, chest pain, shortness of breath, nausea, vomiting, diarrhea, abdominal pain. He is eating better.  Objective: Filed Vitals:   12/08/12 1130 12/08/12 1200 12/08/12 1231 12/08/12 1300  BP: 135/81 125/63 144/76 135/72  Pulse: 70 70 70 70  Temp: 97.6 F (36.4 C) 97.7 F (36.5 C) 97 F (36.1 C) 97.3 F (36.3 C)  TempSrc: Oral Oral Oral Oral  Resp: 18 16  18   Height:      Weight:      SpO2: 95% 98% 96% 95%    Intake/Output Summary (Last 24 hours) at 12/08/12 1937 Last data filed at 12/08/12 1700  Gross per 24 hour  Intake   1113 ml  Output   3050 ml  Net  -1937 ml   Weight change: -5.067 kg (-11 lb 2.7 oz) Exam:   General:  Pt is alert, follows commands appropriately, not in acute distress  HEENT: No icterus, No thrush, [place]/AT  Cardiovascular: RRR, S1/S2, no rubs, no gallops  Respiratory: Fine bibasilar crackles.  No wheezing. Good air movement.  Abdomen: Soft/+BS, non tender, non distended, no guarding  Extremities: 1+ LE edema, No lymphangitis, No petechiae, No rashes, no synovitis  Data Reviewed: Basic Metabolic Panel:  Recent Labs Lab 12/07/12 0147 12/07/12 0824 12/07/12 1333 12/07/12 1958 12/08/12 0508  NA 122* 123* 121* 122* 126*  K 3.7 3.3* 3.7 3.6 3.5  CL 89* 86* 86* 87* 88*  CO2 26 28 25 26 29   GLUCOSE 106* 132* 153* 124* 108*  BUN 11 10 10 11 11   CREATININE 0.66 0.64 0.73 0.75 0.76  CALCIUM 9.4 9.1 9.5 9.4 9.6   Liver Function Tests:  Recent Labs Lab 12/06/12 1850  AST 17  ALT <5  ALKPHOS 82   BILITOT 0.7  PROT 6.5  ALBUMIN 3.5   No results found for this basename: LIPASE, AMYLASE,  in the last 168 hours No results found for this basename: AMMONIA,  in the last 168 hours CBC:  Recent Labs Lab 12/05/12 1130 12/06/12 1850 12/07/12 0147 12/08/12 0508  WBC 1.1* 1.8* 1.7* 1.6*  NEUTROABS 0.6* 1.2*  --   --   HGB 8.3* 10.4* 9.7* 9.5*  HCT 24.5* 29.1* 27.5* 27.0*  MCV 104.1* 97.0 96.8 97.1  PLT 64* 70* 61* 66*   Cardiac Enzymes: No results found for this basename: CKTOTAL, CKMB, CKMBINDEX, TROPONINI,  in the last 168 hours BNP: No components found with this basename: POCBNP,  CBG:  Recent Labs Lab 12/07/12 1649 12/07/12 2150 12/08/12 0747 12/08/12 1218 12/08/12 1722  GLUCAP 113* 109* 111* 109* 112*    No results found for this or any previous visit (from the past 240 hour(s)).   Scheduled Meds: . aspirin  81 mg Oral q morning - 10a  . calcium-vitamin D  1 tablet Oral BID  . ceFAZolin  2 g Intravenous Q8H  . cholecalciferol  1,000 Units Oral BID  . famotidine  20 mg Oral QHS  . finasteride  5 mg Oral q morning - 10a  . fluticasone  1 spray Each Nare Daily  . furosemide  40 mg Intravenous Q12H  . insulin aspart  0-9 Units Subcutaneous TID WC  . lidocaine      . mometasone-formoterol  2 puff Inhalation BID  . multivitamin with minerals  1 tablet Oral Daily  . oxybutynin  10 mg Oral q morning - 10a  . pantoprazole  40 mg Oral Daily  . polyethylene glycol  17 g Oral QHS  . potassium chloride SA  20 mEq Oral Daily  . sodium chloride  3 mL Intravenous Q12H  . sodium chloride  3 mL Intravenous Q12H   Continuous Infusions:    Levon Penning, DO  [facility] Pager [phone]  If 7PM-7AM, please contact night-coverage [email or web address] Password [email or web address] 12/08/2012, 7:37 PM   LOS: 2 days

## 2012-12-08 NOTE — Evaluation (Signed)
Occupational Therapy Evaluation Patient Details Name: [name] MRN: [id number] DOB: Feb 21, 1932 Today's Date: 12/08/2012 Time: 0865-7846 OT Time Calculation (min): 25 min  OT Assessment / Plan / Recommendation History of present illness Pt is a 79 y.o. male with a Past Medical History of recent MSSA bacteremia presumed secondary to infected pacemaker ( pacemaker explanted and has an external pacemaker in place) currently on Ancef therapy at home, history of follicular lymphoma in remission, chronic pancytopenia who presents today with the above noted complaint. History is obtained from the patient, son and daughter at bedside, ever since discharge from the hospital approximately a month ago patient has not been his usual self, he is much weaker than usual. However over the past 2-3 days, his weakness has worsened. He is also had worsening of his lower extremity edema. He normally weighs around 255 pounds, weight here in the emergency room was 264 pounds. Because of his worsening edema, he went to his oncologist's office yesterday and was given 2 units of blood. His weakness did not improve after transfusion, today he had a presyncopal event.   Clinical Impression   Pt up in room for functional mobility and ADL. Overall at min guard to min assist level. Pt states daughter stays overnight and helps with ADL in am PRN and then leaves a few hours and comes back later in day to help PRN. When asked if daughter can provide initial 24/7, he states yes. Feel 24/7 initial supervision/assist would be best. Will follow for acute OT to progress independence with self care tasks.    OT Assessment  Patient needs continued OT Services    Follow Up Recommendations  Home health OT;Supervision/Assistance - 24 hour    Barriers to Discharge      Equipment Recommendations  None recommended by OT    Recommendations for Other Services    Frequency  Min 2X/week    Precautions / Restrictions  Precautions Precautions: Fall Restrictions Weight Bearing Restrictions: No   Pertinent Vitals/Pain No complaint of pain    ADL  Eating/Feeding: Simulated;Independent Where Assessed - Eating/Feeding: Chair Grooming: Simulated;Wash/dry hands;Set up Where Assessed - Grooming: Supported sitting Upper Body Bathing: Simulated;Chest;Right arm;Left arm;Abdomen;Set up Where Assessed - Upper Body Bathing: Unsupported sitting Lower Body Bathing: Simulated;Minimal assistance Where Assessed - Lower Body Bathing: Supported sit to stand Upper Body Dressing: Simulated;Minimal assistance (with gown to manage IV) Where Assessed - Upper Body Dressing: Unsupported sitting Lower Body Dressing: Simulated;Minimal assistance Where Assessed - Lower Body Dressing: Supported sit to stand Toilet Transfer: Performed;Minimal assistance Toilet Transfer Method:  (with walker into bathroom) Acupuncturist: Comfort height toilet;Grab bars Toileting - Clothing Manipulation and Hygiene: Simulated;Minimal assistance Where Assessed - Engineer, mining and Hygiene: Sit to stand from 3-in-1 or toilet Equipment Used: Rolling walker    OT Diagnosis: Generalized weakness  OT Problem List: Decreased strength OT Treatment Interventions: Self-care/ADL training;DME and/or AE instruction;Therapeutic activities;Patient/family education   OT Goals(Current goals can be found in the care plan section) Acute Rehab OT Goals Patient Stated Goal: home when able OT Goal Formulation: With patient Time For Goal Achievement: 12/22/12 Potential to Achieve Goals: Good  Visit Information  Last OT Received On: 12/08/12 Assistance Needed: +1 History of Present Illness: Pt is a 79 y.o. male with a Past Medical History of recent MSSA bacteremia presumed secondary to infected pacemaker ( pacemaker explanted and has an external pacemaker in place) currently on Ancef therapy at home, history of follicular lymphoma in  remission, chronic pancytopenia  who presents today with the above noted complaint. History is obtained from the patient, son and daughter at bedside, ever since discharge from the hospital approximately a month ago patient has not been his usual self, he is much weaker than usual. However over the past 2-3 days, his weakness has worsened. He is also had worsening of his lower extremity edema. He normally weighs around 255 pounds, weight here in the emergency room was 264 pounds. Because of his worsening edema, he went to his oncologist's office yesterday and was given 2 units of blood. His weakness did not improve after transfusion, today he had a presyncopal event.       Prior Functioning     Home Living Family/patient expects to be discharged to:: Private residence Living Arrangements: Children (daughter is home most nights) Available Help at Discharge: Family Type of Home: House Home Access: Level entry Home Layout: One level Home Equipment: Environmental consultant - 4 wheels;Cane - single point;Grab bars - toilet Additional Comments: pt reports he uses SPC but not rollator during ambulation Prior Function Level of Independence: Independent with assistive device(s) Comments: pt reports daughter sets up sponge bath items for him and he can wash and dress self.  He states he can warm up meals that daugther leaves for him Communication Communication: No difficulties         Vision/Perception     Cognition  Cognition Arousal/Alertness: Awake/alert Behavior During Therapy: WFL for tasks assessed/performed Overall Cognitive Status: Within Functional Limits for tasks assessed    Extremity/Trunk Assessment Upper Extremity Assessment Upper Extremity Assessment: Overall WFL for tasks assessed     Mobility Bed Mobility Bed Mobility: Supine to Sit Supine to Sit: 4: Min guard;HOB elevated Details for Bed Mobility Assistance: increased time and effort Transfers Transfers: Sit to Stand;Stand to  Sit Sit to Stand: 4: Min guard;With upper extremity assist;From bed;From toilet Stand to Sit: 4: Min assist;With upper extremity assist;To toilet;To chair/3-in-1 Details for Transfer Assistance: verbal cues for hand placement     Exercise     Balance Balance Balance Assessed: Yes Dynamic Standing Balance Dynamic Standing - Level of Assistance: 5: Stand by assistance   End of Session OT - End of Session Equipment Utilized During Treatment: Rolling walker Activity Tolerance: Patient tolerated treatment well Patient left: in chair;with call bell/phone within reach;with chair alarm set  GO     Lennox Laity [phone] 12/08/2012, 9:39 AM

## 2012-12-08 NOTE — Procedures (Signed)
Successful placement of single lumen PICC line to right basilic vein. Length 47cm Tip at lower SVC/RA No complications Ready for use.  Brayton El PA-C Interventional Radiology 12/08/2012 10:54 AM

## 2012-12-09 ENCOUNTER — Encounter: Payer: Self-pay | Admitting: Internal Medicine

## 2012-12-09 ENCOUNTER — Other Ambulatory Visit: Payer: Medicare Other | Admitting: Lab

## 2012-12-09 ENCOUNTER — Ambulatory Visit: Payer: Medicare Other

## 2012-12-09 ENCOUNTER — Ambulatory Visit: Payer: Medicare Other | Admitting: Oncology

## 2012-12-09 DIAGNOSIS — I10 Essential (primary) hypertension: Secondary | ICD-10-CM

## 2012-12-09 LAB — CBC
HCT: 29.4 % — ABNORMAL LOW (ref 39.0–52.0)
MCHC: 35.7 g/dL (ref 30.0–36.0)
MCV: 97.4 fL (ref 78.0–100.0)
Platelets: 74 10*3/uL — ABNORMAL LOW (ref 150–400)
RBC: 3.02 MIL/uL — ABNORMAL LOW (ref 4.22–5.81)
WBC: 1.4 10*3/uL — CL (ref 4.0–10.5)

## 2012-12-09 LAB — GLUCOSE, CAPILLARY
Glucose-Capillary: 108 mg/dL — ABNORMAL HIGH (ref 70–99)
Glucose-Capillary: 104 mg/dL — ABNORMAL HIGH (ref 70–99)

## 2012-12-09 LAB — BASIC METABOLIC PANEL
CO2: 31 mEq/L (ref 19–32)
Calcium: 10 mg/dL (ref 8.4–10.5)
Chloride: 86 mEq/L — ABNORMAL LOW (ref 96–112)
Potassium: 3.1 mEq/L — ABNORMAL LOW (ref 3.5–5.1)
Sodium: 128 mEq/L — ABNORMAL LOW (ref 135–145)

## 2012-12-09 LAB — HEMOGLOBIN A1C: Mean Plasma Glucose: 128 mg/dL — ABNORMAL HIGH (ref <117)

## 2012-12-09 MED ORDER — SODIUM CHLORIDE 0.9 % IJ SOLN
10.0000 mL | INTRAMUSCULAR | Status: DC | PRN
  Administered 2012-12-11 – 2012-12-12 (×2): 10 mL

## 2012-12-09 MED ORDER — POTASSIUM CHLORIDE CRYS ER 20 MEQ PO TBCR
20.0000 meq | EXTENDED_RELEASE_TABLET | Freq: Once | ORAL | Status: AC
  Administered 2012-12-09: 10:00:00 20 meq via ORAL
  Filled 2012-12-09: qty 1

## 2012-12-09 NOTE — Progress Notes (Signed)
Occupational Therapy Treatment Patient Details Name: [name] MRN: [id number] DOB: 27-Apr-1932 Today's Date: 12/09/2012 Time: 4098-1191 OT Time Calculation (min): 32 min  OT Assessment / Plan / Recommendation  History of present illness Pt is a 79 y.o. male with a Past Medical History of recent MSSA bacteremia presumed secondary to infected pacemaker ( pacemaker explanted and has an external pacemaker in place) currently on Ancef therapy at home, history of follicular lymphoma in remission, chronic pancytopenia who presents today with the above noted complaint. History is obtained from the patient, son and daughter at bedside, ever since discharge from the hospital approximately a month ago patient has not been his usual self, he is much weaker than usual. However over the past 2-3 days, his weakness has worsened. He is also had worsening of his lower extremity edema. He normally weighs around 255 pounds, weight here in the emergency room was 264 pounds. Because of his worsening edema, he went to his oncologist's office yesterday and was given 2 units of blood. His weakness did not improve after transfusion, today he had a presyncopal event.   OT comments  Pt did well this visit with good tolerance for toilet transfer, sponge bathing and grooming. Will continue to follow on acute for self care tasks.   Follow Up Recommendations  Home health OT;Supervision/Assistance - 24 hour    Barriers to Discharge       Equipment Recommendations  None recommended by OT    Recommendations for Other Services    Frequency Min 2X/week   Progress towards OT Goals Progress towards OT goals: Progressing toward goals  Plan Discharge plan remains appropriate    Precautions / Restrictions Precautions Precautions: Fall Restrictions Weight Bearing Restrictions: No   Pertinent Vitals/Pain No complaint of pain.    ADL  Grooming: Performed;Teeth care;Set up Where Assessed - Grooming: Unsupported  sitting Upper Body Bathing: Chest;Right arm;Left arm;Abdomen;Set up Where Assessed - Upper Body Bathing: Unsupported sitting Lower Body Bathing: Min guard Where Assessed - Lower Body Bathing: Supported sit to stand Upper Body Dressing: Simulated;Minimal assistance (with gown for IV management) Where Assessed - Upper Body Dressing: Unsupported sitting Toilet Transfer: Performed;Min guard (verbal cues for safe management of walker) Toilet Transfer Equipment: Comfort height toilet;Grab bars Toileting - Clothing Manipulation and Hygiene: Simulated;Min guard Where Assessed - Toileting Clothing Manipulation and Hygiene: Sit to stand from 3-in-1 or toilet Equipment Used: Rolling walker ADL Comments: At times, pt lifted walker off the floor and needed min verbal cues to manage walker safely. Pt did well up in room for toileting, stood for urinal use with min guard assist. Also stood to wash periareas. No LOB noted.     OT Diagnosis:    OT Problem List:   OT Treatment Interventions:     OT Goals(current goals can now be found in the care plan section)    Visit Information  Last OT Received On: 12/09/12 Assistance Needed: +1 History of Present Illness: Pt is a 79 y.o. male with a Past Medical History of recent MSSA bacteremia presumed secondary to infected pacemaker ( pacemaker explanted and has an external pacemaker in place) currently on Ancef therapy at home, history of follicular lymphoma in remission, chronic pancytopenia who presents today with the above noted complaint. History is obtained from the patient, son and daughter at bedside, ever since discharge from the hospital approximately a month ago patient has not been his usual self, he is much weaker than usual. However over the past 2-3 days, his  weakness has worsened. He is also had worsening of his lower extremity edema. He normally weighs around 255 pounds, weight here in the emergency room was 264 pounds. Because of his worsening edema,  he went to his oncologist's office yesterday and was given 2 units of blood. His weakness did not improve after transfusion, today he had a presyncopal event.    Subjective Data      Prior Functioning       Cognition  Cognition Arousal/Alertness: Awake/alert Behavior During Therapy: WFL for tasks assessed/performed Overall Cognitive Status: Within Functional Limits for tasks assessed    Mobility  Bed Mobility Bed Mobility: Supine to Sit Supine to Sit: HOB elevated;6: Modified independent (Device/Increase time) Transfers Transfers: Sit to Stand;Stand to Sit Sit to Stand: 5: Supervision;With upper extremity assist;From bed;From toilet Stand to Sit: 5: Supervision;With upper extremity assist;To chair/3-in-1;To toilet    Exercises      Balance Balance Balance Assessed: Yes Dynamic Standing Balance Dynamic Standing - Level of Assistance: 5: Stand by assistance   End of Session OT - End of Session Equipment Utilized During Treatment: Rolling walker Activity Tolerance: Patient tolerated treatment well Patient left: in chair;with call bell/phone within reach;with chair alarm set  GO     Lennox Laity [phone] 12/09/2012, 9:34 AM

## 2012-12-09 NOTE — Progress Notes (Signed)
Physical Therapy Treatment Patient Details Name: [name] MRN: [id number] DOB: June 27, 1932 Today's Date: 12/09/2012 Time: 1027-1039 PT Time Calculation (min): 12 min  PT Assessment / Plan / Recommendation  History of Present Illness Pt is a 79 y.o. male with a Past Medical History of recent MSSA bacteremia presumed secondary to infected pacemaker ( pacemaker explanted and has an external pacemaker in place) currently on Ancef therapy at home, history of follicular lymphoma in remission, chronic pancytopenia who presents today with the above noted complaint. History is obtained from the patient, son and daughter at bedside, ever since discharge from the hospital approximately a month ago patient has not been his usual self, he is much weaker than usual. However over the past 2-3 days, his weakness has worsened. He is also had worsening of his lower extremity edema. He normally weighs around 255 pounds, weight here in the emergency room was 264 pounds. Because of his worsening edema, he went to his oncologist's office yesterday and was given 2 units of blood. His weakness did not improve after transfusion, today he had a presyncopal event.   PT Comments   Progressing with mobility. Educated pt on need for walker use for ambulation.   Follow Up Recommendations  Home health PT;Supervision for mobility/OOB     Does the patient have the potential to tolerate intense rehabilitation     Barriers to Discharge        Equipment Recommendations  None recommended by PT    Recommendations for Other Services    Frequency Min 3X/week   Progress towards PT Goals Progress towards PT goals: Progressing toward goals  Plan Current plan remains appropriate    Precautions / Restrictions Precautions Precautions: Fall Restrictions Weight Bearing Restrictions: No   Pertinent Vitals/Pain No c/o pain    Mobility  Bed Mobility Bed Mobility: Sit to Supine Supine to Sit: HOB elevated;6: Modified  independent (Device/Increase time) Sit to Supine: 6: Modified independent (Device/Increase time) Transfers Transfers: Sit to Stand;Stand to Sit Sit to Stand: 5: Supervision;From chair/3-in-1 Stand to Sit: 5: Supervision;To bed Ambulation/Gait Ambulation/Gait Assistance: 4: Min assist Ambulation Distance (Feet): 145 Feet Assistive device: Straight cane Ambulation/Gait Assistance Details: Pt stated he would like to try ambulating with cane on today. Unsteady with cane use. Also noted pt fatigues fairly easily. Educated him on importance of using walker once back home.  Gait Pattern: Decreased dorsiflexion - right;Decreased dorsiflexion - left;Decreased stride length;Step-through pattern;Trunk flexed    Exercises General Exercises - Lower Extremity Ankle Circles/Pumps: AROM;Both;10 reps;Supine Short Arc Quad: AROM;Both;10 reps;Supine Heel Slides: AROM;Both;10 reps;Supine Hip ABduction/ADduction: AROM;Both;10 reps;Supine   PT Diagnosis:    PT Problem List:   PT Treatment Interventions:     PT Goals (current goals can now be found in the care plan section)    Visit Information  Last PT Received On: 12/09/12 Assistance Needed: +1 History of Present Illness: Pt is a 79 y.o. male with a Past Medical History of recent MSSA bacteremia presumed secondary to infected pacemaker ( pacemaker explanted and has an external pacemaker in place) currently on Ancef therapy at home, history of follicular lymphoma in remission, chronic pancytopenia who presents today with the above noted complaint. History is obtained from the patient, son and daughter at bedside, ever since discharge from the hospital approximately a month ago patient has not been his usual self, he is much weaker than usual. However over the past 2-3 days, his weakness has worsened. He is also had worsening of his lower extremity  edema. He normally weighs around 255 pounds, weight here in the emergency room was 264 pounds. Because of his  worsening edema, he went to his oncologist's office yesterday and was given 2 units of blood. His weakness did not improve after transfusion, today he had a presyncopal event.    Subjective Data      Cognition  Cognition Arousal/Alertness: Awake/alert Behavior During Therapy: WFL for tasks assessed/performed Overall Cognitive Status: Within Functional Limits for tasks assessed    Balance  Balance Balance Assessed: Yes Dynamic Standing Balance Dynamic Standing - Balance Support: Right upper extremity supported Dynamic Standing - Level of Assistance: 4: Min assist Dynamic Standing - Comments: LOB multiple times towards R side with 1 hand support (cane). External assist required to prevent fall  End of Session PT - End of Session Equipment Utilized During Treatment: Gait belt Activity Tolerance: Patient limited by fatigue Patient left: in bed;with call bell/phone within reach;with bed alarm set   GP     Rebeca Alert, MPT Pager: [phone]

## 2012-12-09 NOTE — Progress Notes (Signed)
[facility] PROGRESS NOTE  [name] RUE:[id number] DOB: 1932-12-28 DOA: 12/06/2012 PCP: Sandrea Hughs, MD  Assessment/Plan: Hyponatremia  -continues to improve - Clinically patient has hypervolemic hyponatremia  -history of chronic edema that has been attributed to chronic venous disease and dependent edema  -suspect worsening edema may be from diastolic heart failure  -Urinalysis negative for proteinuria  -LFTs does not suggest any liver issue -continue Lasix 40 mg IV twice a day, fluid restriction of 1200 cc -strict I&O's and daily weights will be ordered.  - He takes Lexapro and trazodone, both of them will be discontinued for now.  -serum osmolality--254, urine 218,  -TSH--13.34  -check free T4--0.88  - BMP in am  -nephrology consult if Na without improvement  Acute on chronic diastolic CHF  -10/31/2012 EF 55-60  -Continue intravenous furosemide 40 mg IV twice a day  -Serum sodium is improving with diuresis  -neg [id number] for the admission  -Daily weights  Recent MSSA bacteremia secondary to 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  [name] Second degree AV block, Mobitz type II  - External pacemaker in place  - Patient follows up with Dr. Sharrell Ku of [facility] cardiology   . DM (diabetes mellitus), secondary uncontrolled  - Will stop metformin, place on sliding scale insulin while inpatient  -CBGs controlled -HbA1C--6.1 . Essential hypertension, benign  - Will be on Lasix  -monitor blood pressure and adjust accordingly  Euthyroid sick syndrome  -Recheck TSH and free T4 in 4-6 weeks  . GERD  - Stable, continue PPI   . Pancytopenia  - Chronic issue, recently-i.e. 11/10--had 2 units PRBC transfusion  - Follows with Dr. Clelia Croft of the oncology service.  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    [name] LYMPHOMA NEC, MLIG, INGUINAL/LOWER LIMB  -Follicular lymphoma as a grade 3, stage III, diagnosed 2007, continued to be in remission- per last oncology note.  Family Communication: daughter at beside  Disposition Plan: Home when medically stable         Procedures/Studies: Dg Chest 2 View  12/06/2012   CLINICAL DATA:  Shortness of breath. Congestive heart failure. Lymphoma.  EXAM: CHEST  2 VIEW  COMPARISON:  11/07/2012  FINDINGS: Cardiomegaly and chronic pulmonary vascular congestion are stable. No evidence of acute infiltrate or edema. No evidence of pleural effusion.  Prior CABG noted. Single lead transvenous pacemaker remains in appropriate position. Right arm PICC line has been pulled back, with tip now overlying the right subclavian vein.  IMPRESSION: Stable cardiomegaly and pulmonary venous hypertension. No acute lung disease.  Abnormal right arm PICC line position with tip in the right subclavian vein.   Electronically Signed   By: Myles Rosenthal M.D.   On: 12/06/2012 19:46   Ir Fluoro Guide Cv Line Right  12/08/2012   CLINICAL DATA:  Recent MSSA bacteremia. Previously placed PICC line has been inadvertently removed. Request for new PICC line placement  EXAM: IR RIGHT FLUORO GUIDE CV LINE; IR ULTRASOUND GUIDANCE VASC ACCESS RIGHT  TECHNIQUE: The right arm was prepped with chlorhexidine, draped in the usual sterile fashion using maximum barrier technique (cap and mask, sterile gown, sterile gloves, large sterile sheet, hand hygiene and cutaneous antiseptic). Local anesthesia was attained by infiltration with 1% lidocaine.  Ultrasound demonstrated patency of the basilic vein, and this was documented with an image. Under real-time ultrasound guidance, this vein  was accessed with a 21 gauge micropuncture needle and image documentation was performed. The needle was exchanged over a guidewire for a peel-away sheath through which a 47 cm 5 [place] single lumen power injectable PICC was advanced,  and positioned with its tip at the lower SVC/right atrial junction. Fluoroscopy during the procedure and fluoro spot radiograph confirms appropriate catheter position. The catheter was flushed, secured to the skin with Prolene sutures, and covered with a sterile dressing.  FLUOROSCOPY TIME:  1 min, 48 seconds.  COMPLICATIONS: None.  The patient tolerated the procedure well.  IMPRESSION: Successful placement of a right arm PICC with sonographic and fluoroscopic guidance. The catheter is ready for use.  Read by: Brayton El PA-C   Electronically Signed   By: Malachy Moan M.D.   On: 12/08/2012 11:12   Ir US Guide Vasc Access Right  12/08/2012   CLINICAL DATA:  Recent MSSA bacteremia. Previously placed PICC line has been inadvertently removed. Request for new PICC line placement  EXAM: IR RIGHT FLUORO GUIDE CV LINE; IR ULTRASOUND GUIDANCE VASC ACCESS RIGHT  TECHNIQUE: The right arm was prepped with chlorhexidine, draped in the usual sterile fashion using maximum barrier technique (cap and mask, sterile gown, sterile gloves, large sterile sheet, hand hygiene and cutaneous antiseptic). Local anesthesia was attained by infiltration with 1% lidocaine.  Ultrasound demonstrated patency of the basilic vein, and this was documented with an image. Under real-time ultrasound guidance, this vein was accessed with a 21 gauge micropuncture needle and image documentation was performed. The needle was exchanged over a guidewire for a peel-away sheath through which a 47 cm 5 [place] single lumen power injectable PICC was advanced, and positioned with its tip at the lower SVC/right atrial junction. Fluoroscopy during the procedure and fluoro spot radiograph confirms appropriate catheter position. The catheter was flushed, secured to the skin with Prolene sutures, and covered with a sterile dressing.  FLUOROSCOPY TIME:  1 min, 48 seconds.  COMPLICATIONS: None.  The patient tolerated the procedure well.  IMPRESSION: Successful  placement of a right arm PICC with sonographic and fluoroscopic guidance. The catheter is ready for use.  Read by: Brayton El PA-C   Electronically Signed   By: Malachy Moan M.D.   On: 12/08/2012 11:12         Subjective: Patient continues to feel better. He denies any fevers, chills, chest pain, nausea, vomiting, diarrhea, abdominal pain. No dizziness, headache  Objective: Filed Vitals:   12/08/12 2246 12/09/12 0423 12/09/12 0937 12/09/12 1453  BP: 127/74 130/71  113/56  Pulse: 69 70  70  Temp: 98.1 F (36.7 C) 98 F (36.7 C)  97.4 F (36.3 C)  TempSrc: Oral Oral  Oral  Resp: 18 18  18   Height:      Weight:  110.4 kg (243 lb 6.2 oz)    SpO2:  96% 97% 98%    Intake/Output Summary (Last 24 hours) at 12/09/12 1636 Last data filed at 12/09/12 1300  Gross per 24 hour  Intake   1010 ml  Output   4350 ml  Net  -3340 ml   Weight change: -4.6 kg (-10 lb 2.3 oz) Exam:   General:  Pt is alert, follows commands appropriately, not in acute distress  HEENT: No icterus, No thrush,/AT  Cardiovascular: RRR, S1/S2, no rubs, no gallops  Respiratory: Bibasilar crackles. No wheezing. Good air movement.  Abdomen: Soft/+BS, non tender, non distended, no guarding  Extremities: 1+ LE edema, No lymphangitis, No petechiae, No rashes,  no synovitis  Data Reviewed: Basic Metabolic Panel:  Recent Labs Lab 12/07/12 0824 12/07/12 1333 12/07/12 1958 12/08/12 0508 12/09/12 0455  NA 123* 121* 122* 126* 128*  K 3.3* 3.7 3.6 3.5 3.1*  CL 86* 86* 87* 88* 86*  CO2 28 25 26 29 31   GLUCOSE 132* 153* 124* 108* 121*  BUN 10 10 11 11 11   CREATININE 0.64 0.73 0.75 0.76 0.59  CALCIUM 9.1 9.5 9.4 9.6 10.0  MG  --   --   --   --  1.5   Liver Function Tests:  Recent Labs Lab 12/06/12 1850  AST 17  ALT <5  ALKPHOS 82  BILITOT 0.7  PROT 6.5  ALBUMIN 3.5   No results found for this basename: LIPASE, AMYLASE,  in the last 168 hours No results found for this basename: AMMONIA,   in the last 168 hours CBC:  Recent Labs Lab 12/05/12 1130 12/06/12 1850 12/07/12 0147 12/08/12 0508 12/09/12 0455  WBC 1.1* 1.8* 1.7* 1.6* 1.4*  NEUTROABS 0.6* 1.2*  --   --   --   HGB 8.3* 10.4* 9.7* 9.5* 10.5*  HCT 24.5* 29.1* 27.5* 27.0* 29.4*  MCV 104.1* 97.0 96.8 97.1 97.4  PLT 64* 70* 61* 66* 74*   Cardiac Enzymes: No results found for this basename: CKTOTAL, CKMB, CKMBINDEX, TROPONINI,  in the last 168 hours BNP: No components found with this basename: POCBNP,  CBG:  Recent Labs Lab 12/08/12 1218 12/08/12 1722 12/08/12 2249 12/09/12 0728 12/09/12 1149  GLUCAP 109* 112* 123* 108* 131*    No results found for this or any previous visit (from the past 240 hour(s)).   Scheduled Meds: . aspirin  81 mg Oral q morning - 10a  . calcium-vitamin D  1 tablet Oral BID  . ceFAZolin  2 g Intravenous Q8H  . cholecalciferol  1,000 Units Oral BID  . famotidine  20 mg Oral QHS  . finasteride  5 mg Oral q morning - 10a  . fluticasone  1 spray Each Nare Daily  . furosemide  40 mg Intravenous Q12H  . insulin aspart  0-9 Units Subcutaneous TID WC  . mometasone-formoterol  2 puff Inhalation BID  . multivitamin with minerals  1 tablet Oral Daily  . oxybutynin  10 mg Oral q morning - 10a  . pantoprazole  40 mg Oral Daily  . polyethylene glycol  17 g Oral QHS  . potassium chloride SA  20 mEq Oral Daily  . sodium chloride  3 mL Intravenous Q12H  . sodium chloride  3 mL Intravenous Q12H   Continuous Infusions:    Serafina Topham, DO  [facility] Pager [phone]  If 7PM-7AM, please contact night-coverage [email or web address] Password [email or web address] 12/09/2012, 4:36 PM   LOS: 3 days

## 2012-12-09 NOTE — Progress Notes (Signed)
CRITICAL VALUE ALERT  Critical value received:  WBC 1.4  Date of notification:  12/09/2012   Time of notification:  6:25 AM  Critical value read back:yes  Nurse who received alert:  Silvestre Gunner, RN  MD notified (1st page):  L Easterwood  Time of first page:  6:28 AM  MD notified (2nd page):  Time of second page:  Responding MD:  Coralie Carpen  Time MD responded:  6:29 AM

## 2012-12-10 DIAGNOSIS — R5381 Other malaise: Secondary | ICD-10-CM

## 2012-12-10 LAB — BASIC METABOLIC PANEL
BUN: 15 mg/dL (ref 6–23)
Chloride: 87 mEq/L — ABNORMAL LOW (ref 96–112)
Creatinine, Ser: 0.71 mg/dL (ref 0.50–1.35)
GFR calc Af Amer: >90 mL/min (ref >90)
Potassium: 3.6 mEq/L (ref 3.5–5.1)

## 2012-12-10 LAB — GLUCOSE, CAPILLARY
Glucose-Capillary: 127 mg/dL — ABNORMAL HIGH (ref 70–99)
Glucose-Capillary: 113 mg/dL — ABNORMAL HIGH (ref 70–99)

## 2012-12-10 MED ORDER — CEFAZOLIN SODIUM-DEXTROSE 2-3 GM-% IV SOLR: 2.0000 g | Freq: Three times a day (TID) | INTRAVENOUS | Status: DC

## 2012-12-10 MED ORDER — FUROSEMIDE 40 MG PO TABS
40.0000 mg | ORAL_TABLET | Freq: Every day | ORAL | Status: DC
  Administered 2012-12-11: 40 mg via ORAL
  Filled 2012-12-10 (×2): qty 1

## 2012-12-10 MED ORDER — LORAZEPAM 0.5 MG PO TABS
0.5000 mg | ORAL_TABLET | Freq: Once | ORAL | Status: AC
  Administered 2012-12-10: 05:00:00 0.5 mg via ORAL
  Filled 2012-12-10: qty 1

## 2012-12-10 MED ORDER — FUROSEMIDE 40 MG PO TABS
40.0000 mg | ORAL_TABLET | Freq: Once | ORAL | Status: AC
  Administered 2012-12-10: 40 mg via ORAL
  Filled 2012-12-10: qty 1

## 2012-12-10 NOTE — Progress Notes (Signed)
12/10/2012 1200 NCM contacted [facility] to make aware of scheduled dc for 12/11/2012 with IV abx. Daryl Eastern RN CCM Case Mgmt phone [phone]

## 2012-12-10 NOTE — Progress Notes (Signed)
[facility] PROGRESS NOTE  [name] AVW:[id number] DOB: Oct 24, 1932 DOA: 12/06/2012 PCP: Sandrea Hughs, MD  Assessment/Plan: Hyponatremia  -stable/improving  -Review of the medical records indicates that the patient has had hyponatremia chronically in the past with a range 126-133 - Clinically patient has hypervolemic hyponatremia  -history of chronic edema that has been attributed to chronic venous disease and dependent edema  -suspect worsening edema may be from diastolic heart failure  -Urinalysis negative for proteinuria  -LFTs does not suggest any liver issue  -change IV-->po lasix; fluid restriction of 1200 cc  -strict I&O's and daily weights will be ordered.  - He takes Lexapro and trazodone, both of them will be discontinued for now.  -serum osmolality--254, urine osm218,  -TSH--13.34  -check free T4--0.88  - BMP in am  -nephrology consult if Na without improvement  Acute on chronic diastolic CHF  -10/31/2012 EF 55-60  -Continue intravenous furosemide 40 mg IV twice a day  -Serum sodium is improving with diuresis  -neg 9617cc for the admission  -Daily weights  Recent MSSA bacteremia secondary to 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  [name] Second degree AV block, Mobitz type II  - External pacemaker in place  - Patient follows up with Dr. Sharrell Ku of [facility] cardiology   . DM (diabetes mellitus), secondary uncontrolled  - Will stop metformin, place on sliding scale insulin while inpatient  -CBGs controlled  -HbA1C--6.1 . Essential hypertension, benign  -If blood pressure remains stable/improved - Will be on Lasix  -monitor blood pressure and adjust accordingly  Euthyroid sick syndrome  -Recheck TSH and free T4 in 4-6 weeks  . GERD  - Stable, continue PPI   . Pancytopenia  - Chronic issue, recently-i.e.  11/10--had 2 units PRBC transfusion  - Follows with Dr. Clelia Croft of the oncology service.  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   [name] LYMPHOMA NEC, MLIG, INGUINAL/LOWER LIMB  -Follicular lymphoma as a grade 3, stage III, diagnosed 2007, continued to be in remission- per last oncology note.  Family Communication: daughter updated Disposition Plan: Home when medically stable          Procedures/Studies: Dg Chest 2 View  12/06/2012   CLINICAL DATA:  Shortness of breath. Congestive heart failure. Lymphoma.  EXAM: CHEST  2 VIEW  COMPARISON:  11/07/2012  FINDINGS: Cardiomegaly and chronic pulmonary vascular congestion are stable. No evidence of acute infiltrate or edema. No evidence of pleural effusion.  Prior CABG noted. Single lead transvenous pacemaker remains in appropriate position. Right arm PICC line has been pulled back, with tip now overlying the right subclavian vein.  IMPRESSION: Stable cardiomegaly and pulmonary venous hypertension. No acute lung disease.  Abnormal right arm PICC line position with tip in the right subclavian vein.   Electronically Signed   By: Myles Rosenthal M.D.   On: 12/06/2012 19:46   Ir Fluoro Guide Cv Line Right  12/08/2012   CLINICAL DATA:  Recent MSSA bacteremia. Previously placed PICC line has been inadvertently removed. Request for new PICC line placement  EXAM: IR RIGHT FLUORO GUIDE CV LINE; IR ULTRASOUND GUIDANCE VASC ACCESS RIGHT  TECHNIQUE: The right arm was prepped with chlorhexidine, draped in the usual sterile fashion using maximum barrier technique (cap and mask, sterile gown, sterile gloves, large sterile sheet, hand hygiene and cutaneous antiseptic). Local anesthesia was attained by infiltration with 1% lidocaine.  Ultrasound demonstrated patency of the basilic vein, and this was documented with an image. Under real-time ultrasound guidance, this vein was accessed with a 21 gauge micropuncture needle and image documentation  was performed. The needle was exchanged over a guidewire for a peel-away sheath through which a 47 cm 5 [place] single lumen power injectable PICC was advanced, and positioned with its tip at the lower SVC/right atrial junction. Fluoroscopy during the procedure and fluoro spot radiograph confirms appropriate catheter position. The catheter was flushed, secured to the skin with Prolene sutures, and covered with a sterile dressing.  FLUOROSCOPY TIME:  1 min, 48 seconds.  COMPLICATIONS: None.  The patient tolerated the procedure well.  IMPRESSION: Successful placement of a right arm PICC with sonographic and fluoroscopic guidance. The catheter is ready for use.  Read by: Brayton El PA-C   Electronically Signed   By: Malachy Moan M.D.   On: 12/08/2012 11:12   Ir US Guide Vasc Access Right  12/08/2012   CLINICAL DATA:  Recent MSSA bacteremia. Previously placed PICC line has been inadvertently removed. Request for new PICC line placement  EXAM: IR RIGHT FLUORO GUIDE CV LINE; IR ULTRASOUND GUIDANCE VASC ACCESS RIGHT  TECHNIQUE: The right arm was prepped with chlorhexidine, draped in the usual sterile fashion using maximum barrier technique (cap and mask, sterile gown, sterile gloves, large sterile sheet, hand hygiene and cutaneous antiseptic). Local anesthesia was attained by infiltration with 1% lidocaine.  Ultrasound demonstrated patency of the basilic vein, and this was documented with an image. Under real-time ultrasound guidance, this vein was accessed with a 21 gauge micropuncture needle and image documentation was performed. The needle was exchanged over a guidewire for a peel-away sheath through which a 47 cm 5 [place] single lumen power injectable PICC was advanced, and positioned with its tip at the lower SVC/right atrial junction. Fluoroscopy during the procedure and fluoro spot radiograph confirms appropriate catheter position. The catheter was flushed, secured to the skin with Prolene sutures, and  covered with a sterile dressing.  FLUOROSCOPY TIME:  1 min, 48 seconds.  COMPLICATIONS: None.  The patient tolerated the procedure well.  IMPRESSION: Successful placement of a right arm PICC with sonographic and fluoroscopic guidance. The catheter is ready for use.  Read by: Brayton El PA-C   Electronically Signed   By: Malachy Moan M.D.   On: 12/08/2012 11:12         Subjective: Patient continues to feel better. The patient was walking down the hall with a walker this morning without any chest discomfort, worsening shortness breath, nausea, vomiting. There is no headache or dizziness.  Objective: Filed Vitals:   12/10/12 0442 12/10/12 0536 12/10/12 0840 12/10/12 0933  BP: 152/68     Pulse: 70     Temp: 98.6 F (37 C)     TempSrc: Oral     Resp: 18  19   Height:      Weight:  109.7 kg (241 lb 13.5 oz)    SpO2: 98%   98%    Intake/Output Summary (Last 24 hours) at 12/10/12 1043 Last data filed at 12/10/12 1034  Gross per 24 hour  Intake   1330 ml  Output   3200 ml  Net  -1870 ml   Weight change: -0.7 kg (-1 lb 8.7 oz) Exam:   General:  Pt is alert, follows commands appropriately, not in acute distress  HEENT: No icterus, No thrush,  [place]/AT  Cardiovascular: RRR, S1/S2, no rubs, no gallops  Respiratory: CTA bilaterally, no wheezing, no crackles, no rhonchi  Abdomen: Soft/+BS, non tender, non distended, no guarding  Extremities: trace LE edema, No lymphangitis, No petechiae, No rashes, no synovitis  Data Reviewed: Basic Metabolic Panel:  Recent Labs Lab 12/07/12 1333 12/07/12 1958 12/08/12 0508 12/09/12 0455 12/10/12 0350  NA 121* 122* 126* 128* 127*  K 3.7 3.6 3.5 3.1* 3.6  CL 86* 87* 88* 86* 87*  CO2 25 26 29 31  32  GLUCOSE 153* 124* 108* 121* 118*  BUN 10 11 11 11 15   CREATININE 0.73 0.75 0.76 0.59 0.71  CALCIUM 9.5 9.4 9.6 10.0 9.8  MG  --   --   --  1.5  --    Liver Function Tests:  Recent Labs Lab 12/06/12 1850  AST 17  ALT <5   ALKPHOS 82  BILITOT 0.7  PROT 6.5  ALBUMIN 3.5   No results found for this basename: LIPASE, AMYLASE,  in the last 168 hours No results found for this basename: AMMONIA,  in the last 168 hours CBC:  Recent Labs Lab 12/05/12 1130 12/06/12 1850 12/07/12 0147 12/08/12 0508 12/09/12 0455  WBC 1.1* 1.8* 1.7* 1.6* 1.4*  NEUTROABS 0.6* 1.2*  --   --   --   HGB 8.3* 10.4* 9.7* 9.5* 10.5*  HCT 24.5* 29.1* 27.5* 27.0* 29.4*  MCV 104.1* 97.0 96.8 97.1 97.4  PLT 64* 70* 61* 66* 74*   Cardiac Enzymes: No results found for this basename: CKTOTAL, CKMB, CKMBINDEX, TROPONINI,  in the last 168 hours BNP: No components found with this basename: POCBNP,  CBG:  Recent Labs Lab 12/09/12 0728 12/09/12 1149 12/09/12 1657 12/09/12 2115 12/10/12 0830  GLUCAP 108* 131* 125* 104* 127*    No results found for this or any previous visit (from the past 240 hour(s)).   Scheduled Meds: . aspirin  81 mg Oral q morning - 10a  . calcium-vitamin D  1 tablet Oral BID  . ceFAZolin  2 g Intravenous Q8H  . cholecalciferol  1,000 Units Oral BID  . famotidine  20 mg Oral QHS  . finasteride  5 mg Oral q morning - 10a  . fluticasone  1 spray Each Nare Daily  . furosemide  40 mg Oral Once  . [START ON 12/11/2012] furosemide  40 mg Oral Daily  . insulin aspart  0-9 Units Subcutaneous TID WC  . mometasone-formoterol  2 puff Inhalation BID  . multivitamin with minerals  1 tablet Oral Daily  . oxybutynin  10 mg Oral q morning - 10a  . pantoprazole  40 mg Oral Daily  . polyethylene glycol  17 g Oral QHS  . potassium chloride SA  20 mEq Oral Daily  . sodium chloride  3 mL Intravenous Q12H   Continuous Infusions:    Victor Stanaland, DO  [facility] Pager [phone]  If 7PM-7AM, please contact night-coverage [email or web address] Password [email or web address] 12/10/2012, 10:43 AM   LOS: 4 days

## 2012-12-10 NOTE — Progress Notes (Signed)
Pt somewhat agitated, reports feeling disoriented and anxious. Able to tell me his name, where he is, and today's correct date. On call notified regarding agitation. One time dose of Ativan given. Will continue to monitor. Ameliyah Sarno, Lavone Orn, RN

## 2012-12-10 NOTE — Discharge Summary (Signed)
Physician Discharge Summary  [name] ZOX:[id number] DOB: 02-15-1932 DOA: 12/06/2012  PCP: Victor Hughs, MD  Admit date: 12/06/2012 Discharge date: 12/11/12 Recommendations for Outpatient Follow-up:  1. Pt will need to follow up with PCP in 2 weeks post discharge 2. Please obtain BMP to evaluate electrolytes and kidney function 3. Please also check CBC to evaluate Hg and Hct levels   Discharge Diagnoses:  Principal Problem:   Hyponatremia Active Problems:   LYMPHOMA NEC, MLIG, INGUINAL/LOWER LIMB   Essential hypertension, benign   GERD   DM (diabetes mellitus), secondary uncontrolled   Atrial fibrillation   Second degree AV block, Mobitz type II   Bacteremia due to Staphylococcus aureus   Pacemaker infection   Pancytopenia   Acute on chronic diastolic heart failure Hyponatremia  --dropped to 124 after improvement to 128--suspect stimulation of ADH with abundant diuresis -However, sodium improved to 130 on the day of discharge -Review of the medical records indicates that the patient has had hyponatremia chronically in the past with a range 126-133  - Clinically patient has hypervolemic hyponatremia  -history of chronic edema that has been attributed to chronic venous disease and dependent edema  -suspect worsening edema may be from diastolic heart failure  -Urinalysis negative for proteinuria  -change IV-->po lasix(12/10/12);  fluid restriction of 1200 cc  -Patient will continue on furosemide 40 mg po daily at home -strict I&O's and daily weights   - He takes Lexapro and trazodone, both of them were discontinued -Daughter is requesting another hypnotic medication for sleep -Ambien 5 mg when necessary sleep--I have explained the risks and alternatives including but not limited to confusion -serum osmolality--254, urine osm218 do not suggest SIADH -TSH--13.34  -check free T4--0.88  - serial BMPs were monitored -nephrology consult appreciated Acute on chronic  diastolic CHF  -10/31/2012 EF 55-60  -intravenous furosemide 40 mg IV twice a day while in hospital -Serum sodium is improving with diuresis  -neg 13,347cc for the admission  -Plan to discharge home with furosemide 40 mg po daily Recent MSSA bacteremia secondary to 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  -new PICC line was placed 12/08/12 because previous PICC line was malpositioned in pt's subclavian vein . Second degree AV block, Mobitz type II  - External pacemaker in place  - Patient follows up with Dr. Sharrell Ku of [facility] cardiology   . DM (diabetes mellitus), secondary uncontrolled  - stop metformin, placed on sliding scale insulin while inpatient  -CBGs controlled  -HbA1C--6.1 . Essential hypertension, benign  -blood pressure remains stable/improved  - remain on Lasix 40 mg daily -monitor blood pressure and adjust accordingly  Euthyroid sick syndrome  -thyroid functions in hospital may have represented euthyroid sick syndrome -Recheck TSH and free T4 in 4-6 weeks  -After nephrology evaluation, Synthroid 25 mcg was in light of the patient's worsened hyponatremia  . GERD  - Stable, continue PPI   . Pancytopenia  - Chronic issue, recently-i.e. 11/10--had 2 units PRBC transfusion  - Follows with Dr. Clelia Croft of the oncology service.  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   [name] LYMPHOMA NEC, MLIG, INGUINAL/LOWER LIMB  -Follicular lymphoma as a grade 3, stage III, diagnosed 2007, continued to be in remission- per last oncology note.  Family Communication: daughter updated     Discharge Condition: Stable  Disposition:  Follow-up Information   Follow up with  [facility]. [facility] Therapy and RN)    Contact information:   [address] [place]  [address] [phone]       Diet: Heart healthy Wt Readings from Last 3 Encounters:  12/12/12 108.228 kg (238 lb 9.6 oz)  11/24/12 116.121 kg (256 lb)  11/23/12 114.306 kg (252 lb)    History of present illness:  [name] is a 79 y.o. male with a Past Medical History of recent MSSA bacteremia presumed secondary to infected pacemaker  (pacemaker explanted and has an external pacemaker in place) currently on Ancef therapy at home, history of follicular lymphoma in remission, chronic pancytopenia who presents today with the above noted complaint.  Since discharge from the hospital approximately a month ago patient has not been his usual self, he is much weaker than usual. over the past 2-3 days, his weakness has worsened. He is also had worsening of his lower extremity edema. He normally weighs around 255 pounds, weight here in the emergency room was 264 pounds. Because of his worsening edema, he went to his oncologist's office yesterday and was given 2 units of blood. His weakness did not improve after transfusion, today he had a presyncopal event. He was then brought to the ED for further evaluation, where his sodium level was found to be 120. I was then asked to admit this patient for further evaluation and treatment.  During my evaluation, patient seemed to be awake and alert, answers all my questions appropriately. He denies any history of nausea, vomiting or diarrhea. Per family, he has a very good oral intake and drinks approximately close to a gallon of water on a daily basis.  Family has noticed that his leg edema has significantly worsened, he was told to take extra Lasix today-normally takes 20 mg of Lasix daily, he took another 20 mg tablet on the day of admission. The patient was started on intravenous furosemide 40 mg IV every 12 hours. The patient exhibited a good diuretics response. The patient lost over 10 kg and 13L during his hospitalization when his weight was compared to a  pre-admission weight of 120 kg on 12/06/2012. The patient's lower extremity edema improved. His endurance improved, and his energy improved. The patient participated with physical therapy. Home health RN and PT were set up. The patient will be discharged home to finish his cefazolin with his last dose on 12/16/2012. After initial improvement of the patient's sodium, his sodium trended back down. Nephrology was consulted. They agreed with current management. In light of the patient's worsened serum sodium, they recommended starting Synthroid based upon the patient's thyroid function studies. The patient will be sent home with Synthroid 25 mcg daily.     Discharge Exam: Filed Vitals:   12/12/12 0618  BP: 131/76  Pulse: 70  Temp: 97.5 F (36.4 C)  Resp: 16   Filed Vitals:   12/11/12 1926 12/11/12 2228 12/12/12 0618 12/12/12 0812  BP:  123/55 131/76   Pulse:  70 70   Temp:  97.9 F (36.6 C) 97.5 F (36.4 C)   TempSrc:  Oral Oral   Resp:  16 16   Height:      Weight:   108.228 kg (238 lb 9.6 oz)   SpO2: 94% 95% 98% 99%   General: A&O x 3, NAD, pleasant, cooperative Cardiovascular: RRR, no rub, no gallop, no S3 Respiratory: CTAB, no wheeze, no rhonchi Abdomen:soft, nontender, nondistended, positive bowel sounds Extremities: trace LE edema, No  lymphangitis, no petechiae  Discharge Instructions      Discharge Orders   Future Appointments Provider Department Dept Phone   12/19/2012 1:45 PM Gardiner Barefoot, MD [facility] [phone]   01/05/2013 1:45 PM Nyoka Cowden, MD [facility] [phone]   Future Orders Complete By Expires   Diet - low sodium heart healthy  As directed    Discharge instructions  As directed    Comments:     Follow up with primary care physician in 2-3 weeks.  Have sodium & thyroid function test rechecked at that time   Increase activity slowly  As directed        Medication List    STOP taking these  medications       escitalopram 20 MG tablet  Commonly known as:  LEXAPRO     sulindac 200 MG tablet  Commonly known as:  CLINORIL     traZODone 50 MG tablet  Commonly known as:  DESYREL      TAKE these medications       albuterol 108 (90 BASE) MCG/ACT inhaler  Commonly known as:  PROVENTIL HFA;VENTOLIN HFA  Inhale 2 puffs into the lungs every 4 (four) hours as needed for wheezing or shortness of breath.     aspirin 81 MG tablet  Take 81 mg by mouth every morning. (hold if bleeding)     ceFAZolin 2-3 GM-% Solr  Commonly known as:  ANCEF  Inject 50 mLs (2 g total) into the vein every 8 (eight) hours. For 6 weeks, STOP DATE 12/16/12     ceFAZolin 2-3 GM-% Solr  Commonly known as:  ANCEF  Inject 50 mLs (2 g total) into the vein every 8 (eight) hours. Continue IV cefazolin through 12/16/12     cetirizine 10 MG tablet  Commonly known as:  ZYRTEC  Take 10 mg by mouth at bedtime as needed for allergies.     CITRUCEL oral powder  Generic drug:  methylcellulose  1 scoop in 8oz of water at bedtime     famotidine 20 MG tablet  Commonly known as:  PEPCID  Take 20 mg by mouth at bedtime.     finasteride 5 MG tablet  Commonly known as:  PROSCAR  Take 5 mg by mouth every morning.     furosemide 40 MG tablet  Commonly known as:  LASIX  Take 1 tablet (40 mg total) by mouth daily. May take 1 extra daily if needed for swelling in legs     hydrocortisone cream 1 %  Apply 1 application topically 2 (two) times daily as needed (itchy rash).     levothyroxine 25 MCG tablet  Commonly known as:  SYNTHROID, LEVOTHROID  Take 1 tablet (25 mcg total) by mouth daily before breakfast.     metFORMIN 500 MG tablet  Commonly known as:  GLUCOPHAGE  Take 500 mg by mouth 2 (two) times daily with a meal.     MIRALAX powder  Generic drug:  polyethylene glycol powder  Take 17 g by mouth at bedtime.     mometasone 50 MCG/ACT nasal spray  Commonly known as:  NASONEX  Place 2 sprays into the  nose 2 (two) times daily.     mometasone-formoterol 200-5 MCG/ACT Aero  Commonly known as:  DULERA  Inhale 2 puffs into the lungs 2 (two) times daily.     MUCINEX DM 30-600 MG per 12 hr tablet  Generic drug:  dextromethorphan-guaiFENesin  Take  1 tablet by mouth every 12 (twelve) hours as needed (cough).     multivitamin tablet  Take 1 tablet by mouth daily.     omeprazole 20 MG capsule  Commonly known as:  PRILOSEC  Take 40 mg by mouth daily before breakfast.     OS-CAL 500 + D 500-200 MG-UNIT per tablet  Generic drug:  calcium-vitamin D  Take 1 tablet by mouth 2 (two) times daily.     oxybutynin 5 MG tablet  Commonly known as:  DITROPAN  Take 10 mg by mouth every morning.     potassium chloride SA 20 MEQ tablet  Commonly known as:  K-DUR,KLOR-CON  Take 20 mEq by mouth daily.     sodium chloride 0.9 % injection  10-40 mLs by Intracatheter route as needed (flush).     traMADol 50 MG tablet  Commonly known as:  ULTRAM  Take 50-100 mg by mouth every 6 (six) hours as needed for pain (cough).     TYLENOL 500 MG tablet  Generic drug:  acetaminophen  Take 500-1,000 mg by mouth every 6 (six) hours as needed.     Vitamin D 1000 UNITS capsule  Take 1,000 Units by mouth 2 (two) times daily.     zolpidem 5 MG tablet  Commonly known as:  AMBIEN  Take 1 tablet (5 mg total) by mouth at bedtime as needed for sleep.         The results of significant diagnostics from this hospitalization (including imaging, microbiology, ancillary and laboratory) are listed below for reference.    Significant Diagnostic Studies: Dg Chest 2 View  12/06/2012   CLINICAL DATA:  Shortness of breath. Congestive heart failure. Lymphoma.  EXAM: CHEST  2 VIEW  COMPARISON:  11/07/2012  FINDINGS: Cardiomegaly and chronic pulmonary vascular congestion are stable. No evidence of acute infiltrate or edema. No evidence of pleural effusion.  Prior CABG noted. Single lead transvenous pacemaker remains in  appropriate position. Right arm PICC line has been pulled back, with tip now overlying the right subclavian vein.  IMPRESSION: Stable cardiomegaly and pulmonary venous hypertension. No acute lung disease.  Abnormal right arm PICC line position with tip in the right subclavian vein.   Electronically Signed   By: Myles Rosenthal M.D.   On: 12/06/2012 19:46   Ir Fluoro Guide Cv Line Right  12/08/2012   CLINICAL DATA:  Recent MSSA bacteremia. Previously placed PICC line has been inadvertently removed. Request for new PICC line placement  EXAM: IR RIGHT FLUORO GUIDE CV LINE; IR ULTRASOUND GUIDANCE VASC ACCESS RIGHT  TECHNIQUE: The right arm was prepped with chlorhexidine, draped in the usual sterile fashion using maximum barrier technique (cap and mask, sterile gown, sterile gloves, large sterile sheet, hand hygiene and cutaneous antiseptic). Local anesthesia was attained by infiltration with 1% lidocaine.  Ultrasound demonstrated patency of the basilic vein, and this was documented with an image. Under real-time ultrasound guidance, this vein was accessed with a 21 gauge micropuncture needle and image documentation was performed. The needle was exchanged over a guidewire for a peel-away sheath through which a 47 cm 5 [place] single lumen power injectable PICC was advanced, and positioned with its tip at the lower SVC/right atrial junction. Fluoroscopy during the procedure and fluoro spot radiograph confirms appropriate catheter position. The catheter was flushed, secured to the skin with Prolene sutures, and covered with a sterile dressing.  FLUOROSCOPY TIME:  1 min, 48 seconds.  COMPLICATIONS: None.  The patient tolerated the procedure well.  IMPRESSION: Successful placement of a right arm PICC with sonographic and fluoroscopic guidance. The catheter is ready for use.  Read by: Brayton El PA-C   Electronically Signed   By: Malachy Moan M.D.   On: 12/08/2012 11:12   Ir US Guide Vasc Access Right  12/08/2012    CLINICAL DATA:  Recent MSSA bacteremia. Previously placed PICC line has been inadvertently removed. Request for new PICC line placement  EXAM: IR RIGHT FLUORO GUIDE CV LINE; IR ULTRASOUND GUIDANCE VASC ACCESS RIGHT  TECHNIQUE: The right arm was prepped with chlorhexidine, draped in the usual sterile fashion using maximum barrier technique (cap and mask, sterile gown, sterile gloves, large sterile sheet, hand hygiene and cutaneous antiseptic). Local anesthesia was attained by infiltration with 1% lidocaine.  Ultrasound demonstrated patency of the basilic vein, and this was documented with an image. Under real-time ultrasound guidance, this vein was accessed with a 21 gauge micropuncture needle and image documentation was performed. The needle was exchanged over a guidewire for a peel-away sheath through which a 47 cm 5 [place] single lumen power injectable PICC was advanced, and positioned with its tip at the lower SVC/right atrial junction. Fluoroscopy during the procedure and fluoro spot radiograph confirms appropriate catheter position. The catheter was flushed, secured to the skin with Prolene sutures, and covered with a sterile dressing.  FLUOROSCOPY TIME:  1 min, 48 seconds.  COMPLICATIONS: None.  The patient tolerated the procedure well.  IMPRESSION: Successful placement of a right arm PICC with sonographic and fluoroscopic guidance. The catheter is ready for use.  Read by: Brayton El PA-C   Electronically Signed   By: Malachy Moan M.D.   On: 12/08/2012 11:12     Microbiology: No results found for this or any previous visit (from the past 240 hour(s)).   Labs: Basic Metabolic Panel:  Recent Labs Lab 12/08/12 0508 12/09/12 0455 12/10/12 0350 12/11/12 0827 12/12/12 0500  NA 126* 128* 127* 124* 130*  K 3.5 3.1* 3.6 3.7 4.0  CL 88* 86* 87* 85* 90*  CO2 29 31 32 29 32  GLUCOSE 108* 121* 118* 202* 127*  BUN 11 11 15 13 12   CREATININE 0.76 0.59 0.71 0.67 0.68  CALCIUM 9.6 10.0 9.8 9.6  9.8  MG  --  1.5  --   --   --    Liver Function Tests:  Recent Labs Lab 12/06/12 1850  AST 17  ALT <5  ALKPHOS 82  BILITOT 0.7  PROT 6.5  ALBUMIN 3.5   No results found for this basename: LIPASE, AMYLASE,  in the last 168 hours No results found for this basename: AMMONIA,  in the last 168 hours CBC:  Recent Labs Lab 12/06/12 1850 12/07/12 0147 12/08/12 0508 12/09/12 0455  WBC 1.8* 1.7* 1.6* 1.4*  NEUTROABS 1.2*  --   --   --   HGB 10.4* 9.7* 9.5* 10.5*  HCT 29.1* 27.5* 27.0* 29.4*  MCV 97.0 96.8 97.1 97.4  PLT 70* 61* 66* 74*   Cardiac Enzymes: No results found for this basename: CKTOTAL, CKMB, CKMBINDEX, TROPONINI,  in the last 168 hours BNP: No components found with this basename: POCBNP,  CBG:  Recent Labs Lab 12/11/12 1222 12/11/12 1651 12/11/12 2226 12/12/12 0725 12/12/12 1159  GLUCAP 111* 121* 141* 110* 106*    Time coordinating discharge:  Greater than 30 minutes  Signed:  Naviyah Schaffert, DO [facility] Pager: [phone] 12/12/2012, 2:11 PM

## 2012-12-11 LAB — BASIC METABOLIC PANEL
GFR calc Af Amer: >90 mL/min (ref >90)
GFR calc non Af Amer: 90 mL/min — ABNORMAL LOW (ref >90)
Potassium: 3.7 mEq/L (ref 3.5–5.1)
Sodium: 124 mEq/L — ABNORMAL LOW (ref 135–145)

## 2012-12-11 LAB — GLUCOSE, CAPILLARY
Glucose-Capillary: 116 mg/dL — ABNORMAL HIGH (ref 70–99)
Glucose-Capillary: 154 mg/dL — ABNORMAL HIGH (ref 70–99)
Glucose-Capillary: 121 mg/dL — ABNORMAL HIGH (ref 70–99)

## 2012-12-11 MED ORDER — TRAZODONE HCL 50 MG PO TABS
50.0000 mg | ORAL_TABLET | Freq: Every day | ORAL | Status: DC
  Administered 2012-12-11: 22:00:00 50 mg via ORAL
  Filled 2012-12-11 (×2): qty 1

## 2012-12-11 MED ORDER — LEVOTHYROXINE SODIUM 25 MCG PO TABS
25.0000 ug | ORAL_TABLET | Freq: Every day | ORAL | Status: DC
  Administered 2012-12-12: 25 ug via ORAL
  Filled 2012-12-11 (×2): qty 1

## 2012-12-11 NOTE — Progress Notes (Signed)
[facility] PROGRESS NOTE  DARRIUS MONTANO GNF:[id number] DOB: 1933/01/14 DOA: 12/06/2012 PCP: Sandrea Hughs, MD  Assessment/Plan: Hyponatremia  -dropped to 124 after improvement to 128--suspect stimulation of ADH with abundant diuresis -Review of the medical records indicates that the patient has had hyponatremia chronically in the past with a range 126-133  - patient has hypervolemic hyponatremia  -Urinalysis negative for proteinuria  -change IV-->po lasix (12/10/12); fluid restriction of 1200 cc  -Patient will continue on furosemide 40 mg po daily at home  -strict I&O's and daily weights  - He takes Lexapro and trazodone, both of them were discontinued  -initial 12/06/12 serum osmolality--254, urine osm218,  -TSH--13.34  -check free T4--0.88  - serial BMPs were monitored  -nephrology consult  Acute on chronic diastolic CHF  -10/31/2012 EF 55-60  -intravenous furosemide 40 mg IV twice a day while in hospital  -change IV-->po lasix (12/10/12); fluid restriction of 1200 cc -neg [id number] for the admission  -Daily weights--lost > 10kg (today's weight unreliable--had previous standing wts) Recent MSSA bacteremia secondary to 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  -new PICC line was placed 12/08/12 because previous PICC line was malpositioned in pt's subclavian vein  . Second degree AV block, Mobitz type II  - External pacemaker in place  - Patient follows up with Dr. Sharrell Ku of [facility] cardiology   . DM (diabetes mellitus), secondary uncontrolled  - stop metformin, placed on sliding scale insulin while inpatient  -CBGs controlled  -HbA1C--6.1 . Essential hypertension, benign  -blood pressure remains stable/improved  - remain on Lasix 40 mg daily  -monitor blood pressure and adjust accordingly  Euthyroid sick syndrome  -Recheck  TSH and free T4 in 4-6 weeks  . GERD  - Stable, continue PPI   . Pancytopenia  - Chronic issue, recently-i.e. 11/10--had 2 units PRBC transfusion  - Follows with Dr. Clelia Croft of the oncology service.  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   [name] LYMPHOMA NEC, MLIG, INGUINAL/LOWER LIMB  -Follicular lymphoma as a grade 3, stage III, diagnosed 2007, continued to be in remission- per last oncology note.  Family Communication: son updated at bedside        Procedures/Studies: Dg Chest 2 View  12/06/2012   CLINICAL DATA:  Shortness of breath. Congestive heart failure. Lymphoma.  EXAM: CHEST  2 VIEW  COMPARISON:  11/07/2012  FINDINGS: Cardiomegaly and chronic pulmonary vascular congestion are stable. No evidence of acute infiltrate or edema. No evidence of pleural effusion.  Prior CABG noted. Single lead transvenous pacemaker remains in appropriate position. Right arm PICC line has been pulled back, with tip now overlying the right subclavian vein.  IMPRESSION: Stable cardiomegaly and pulmonary venous hypertension. No acute lung disease.  Abnormal right arm PICC line position with tip in the right subclavian vein.   Electronically Signed   By: Myles Rosenthal M.D.   On: 12/06/2012 19:46   Ir Fluoro Guide Cv Line Right  12/08/2012   CLINICAL DATA:  Recent MSSA bacteremia. Previously placed PICC line has been inadvertently removed. Request for new PICC line placement  EXAM: IR RIGHT FLUORO GUIDE CV LINE; IR ULTRASOUND GUIDANCE VASC ACCESS RIGHT  TECHNIQUE: The right arm was prepped with chlorhexidine, draped in the usual sterile fashion using maximum barrier technique (cap and mask, sterile gown, sterile gloves, large sterile sheet, hand hygiene and cutaneous antiseptic). Local anesthesia was  attained by infiltration with 1% lidocaine.  Ultrasound demonstrated patency of the basilic vein, and this was documented with an image. Under real-time ultrasound guidance, this vein was  accessed with a 21 gauge micropuncture needle and image documentation was performed. The needle was exchanged over a guidewire for a peel-away sheath through which a 47 cm 5 [place] single lumen power injectable PICC was advanced, and positioned with its tip at the lower SVC/right atrial junction. Fluoroscopy during the procedure and fluoro spot radiograph confirms appropriate catheter position. The catheter was flushed, secured to the skin with Prolene sutures, and covered with a sterile dressing.  FLUOROSCOPY TIME:  1 min, 48 seconds.  COMPLICATIONS: None.  The patient tolerated the procedure well.  IMPRESSION: Successful placement of a right arm PICC with sonographic and fluoroscopic guidance. The catheter is ready for use.  Read by: Brayton El PA-C   Electronically Signed   By: Malachy Moan M.D.   On: 12/08/2012 11:12   Ir US Guide Vasc Access Right  12/08/2012   CLINICAL DATA:  Recent MSSA bacteremia. Previously placed PICC line has been inadvertently removed. Request for new PICC line placement  EXAM: IR RIGHT FLUORO GUIDE CV LINE; IR ULTRASOUND GUIDANCE VASC ACCESS RIGHT  TECHNIQUE: The right arm was prepped with chlorhexidine, draped in the usual sterile fashion using maximum barrier technique (cap and mask, sterile gown, sterile gloves, large sterile sheet, hand hygiene and cutaneous antiseptic). Local anesthesia was attained by infiltration with 1% lidocaine.  Ultrasound demonstrated patency of the basilic vein, and this was documented with an image. Under real-time ultrasound guidance, this vein was accessed with a 21 gauge micropuncture needle and image documentation was performed. The needle was exchanged over a guidewire for a peel-away sheath through which a 47 cm 5 [place] single lumen power injectable PICC was advanced, and positioned with its tip at the lower SVC/right atrial junction. Fluoroscopy during the procedure and fluoro spot radiograph confirms appropriate catheter position. The  catheter was flushed, secured to the skin with Prolene sutures, and covered with a sterile dressing.  FLUOROSCOPY TIME:  1 min, 48 seconds.  COMPLICATIONS: None.  The patient tolerated the procedure well.  IMPRESSION: Successful placement of a right arm PICC with sonographic and fluoroscopic guidance. The catheter is ready for use.  Read by: Brayton El PA-C   Electronically Signed   By: Malachy Moan M.D.   On: 12/08/2012 11:12         Subjective: The patient is feeling well today. He denies fevers, chills, chest discomfort, shortness of breath, nausea, vomiting, diarrhea. He is eating better.  Objective: Filed Vitals:   12/10/12 1426 12/10/12 2024 12/11/12 0444 12/11/12 0812  BP: 132/64 124/69 125/75   Pulse: 71 70 70   Temp: 97.6 F (36.4 C) 97.6 F (36.4 C) 97.5 F (36.4 C)   TempSrc: Oral Oral Oral   Resp: 20 19 18    Height:      Weight:   117.1 kg (258 lb 2.5 oz)   SpO2: 95% 98% 98% 96%    Intake/Output Summary (Last 24 hours) at 12/11/12 1259 Last data filed at 12/11/12 1238  Gross per 24 hour  Intake    320 ml  Output   3250 ml  Net  -2930 ml   Weight change: 7.4 kg (16 lb 5 oz) Exam:   General:  Pt is alert, follows commands appropriately, not in acute distress  HEENT: No icterus, No thrush,[place]/AT  Cardiovascular: RRR, S1/S2, no rubs, no  gallops  Respiratory: CTA bilaterally, no wheezing, no crackles, no rhonchi  Abdomen: Soft/+BS, non tender, non distended, no guarding  Extremities: 1+ edema, No lymphangitis, No petechiae, No rashes, no synovitis  Data Reviewed: Basic Metabolic Panel:  Recent Labs Lab 12/07/12 1958 12/08/12 0508 12/09/12 0455 12/10/12 0350 12/11/12 0827  NA 122* 126* 128* 127* 124*  K 3.6 3.5 3.1* 3.6 3.7  CL 87* 88* 86* 87* 85*  CO2 26 29 31  32 29  GLUCOSE 124* 108* 121* 118* 202*  BUN 11 11 11 15 13   CREATININE 0.75 0.76 0.59 0.71 0.67  CALCIUM 9.4 9.6 10.0 9.8 9.6  MG  --   --  1.5  --   --    Liver Function  Tests:  Recent Labs Lab 12/06/12 1850  AST 17  ALT <5  ALKPHOS 82  BILITOT 0.7  PROT 6.5  ALBUMIN 3.5   No results found for this basename: LIPASE, AMYLASE,  in the last 168 hours No results found for this basename: AMMONIA,  in the last 168 hours CBC:  Recent Labs Lab 12/05/12 1130 12/06/12 1850 12/07/12 0147 12/08/12 0508 12/09/12 0455  WBC 1.1* 1.8* 1.7* 1.6* 1.4*  NEUTROABS 0.6* 1.2*  --   --   --   HGB 8.3* 10.4* 9.7* 9.5* 10.5*  HCT 24.5* 29.1* 27.5* 27.0* 29.4*  MCV 104.1* 97.0 96.8 97.1 97.4  PLT 64* 70* 61* 66* 74*   Cardiac Enzymes: No results found for this basename: CKTOTAL, CKMB, CKMBINDEX, TROPONINI,  in the last 168 hours BNP: No components found with this basename: POCBNP,  CBG:  Recent Labs Lab 12/09/12 1149 12/09/12 1657 12/09/12 2115 12/10/12 0830 12/10/12 1139  GLUCAP 131* 125* 104* 127* 113*    No results found for this or any previous visit (from the past 240 hour(s)).   Scheduled Meds: . aspirin  81 mg Oral q morning - 10a  . calcium-vitamin D  1 tablet Oral BID  . ceFAZolin  2 g Intravenous Q8H  . cholecalciferol  1,000 Units Oral BID  . famotidine  20 mg Oral QHS  . finasteride  5 mg Oral q morning - 10a  . fluticasone  1 spray Each Nare Daily  . furosemide  40 mg Oral Q breakfast  . insulin aspart  0-9 Units Subcutaneous TID WC  . mometasone-formoterol  2 puff Inhalation BID  . multivitamin with minerals  1 tablet Oral Daily  . oxybutynin  10 mg Oral q morning - 10a  . pantoprazole  40 mg Oral Daily  . polyethylene glycol  17 g Oral QHS  . potassium chloride SA  20 mEq Oral Daily  . sodium chloride  3 mL Intravenous Q12H   Continuous Infusions:    Guido Comp, DO  [facility] Pager [phone]  If 7PM-7AM, please contact night-coverage [email or web address] Password [email or web address] 12/11/2012, 12:59 PM   LOS: 5 days

## 2012-12-11 NOTE — Consult Note (Signed)
Reason for Consult: Hyponatremia Referring Physician: Tat  [name] is an 79 y.o. male with past medical history significant for follicular lymphoma currently in remission, type 2 diabetes mellitus, atrial fibrillation and also a recent  MSSA bacteremia do to an infected pacemaker. He presented to the hospital on November 11 with a chief complaint of weakness. He was also noted to have weight gain and worsening lower extremity edema. Evaluation for this found that he had a sodium level of 120 and was admitted to the hospital. His recent sodium levels have been in the high 120s and low 130s. He was admitted and determined to have hypervolemic hyponatremia. He had a urine osmolality that was appropriately low. She was admitted and successfully diuresed. Sodium had bounced around a little bit was trended for the better and had 128 on November 14. However the last couple of days it is trended down and today is 124. That is the reason for consultation. It appears that patient had continued to diurese well. In addition, patient was noted to be on Lexapro and trazodone both of which were stopped appropriately. I see a TSH was checked which ws slightly elevated and cortisol was not checked.  According to family his MS has been an issue.  He stays at home alone but family has been needing to check on him more often of late.     Trend in Creatinine: Creatinine  Date/Time Value Range Status  08/10/2012 11:17 AM 0.9  0.7 - 1.3 mg/dL Final  1/61/0960 45:40 PM 0.9  0.7 - 1.3 mg/dL Final  98/11/9145  8:29 PM 1.0  0.7 - 1.3 mg/dL Final  5/62/1308 65:78 PM 0.8  0.7 - 1.3 mg/dL Final     Creatinine, Ser  Date/Time Value Range Status  12/11/2012  8:27 AM 0.67  0.50 - 1.35 mg/dL Final  46/96/2952  8:41 AM 0.71  0.50 - 1.35 mg/dL Final  32/44/0102  7:25 AM 0.59  0.50 - 1.35 mg/dL Final  36/64/4034  7:42 AM 0.76  0.50 - 1.35 mg/dL Final  59/56/3875  6:43 PM 0.75  0.50 - 1.35 mg/dL Final  32/95/1884  1:66 PM  0.73  0.50 - 1.35 mg/dL Final  07/26/1599  0:93 AM 0.64  0.50 - 1.35 mg/dL Final  23/55/7322  0:25 AM 0.66  0.50 - 1.35 mg/dL Final  42/70/6237  6:28 PM 0.67  0.50 - 1.35 mg/dL Final  31/51/7616  0:73 AM 0.75  0.50 - 1.35 mg/dL Final  71/06/2692  8:54 AM 0.86  0.50 - 1.35 mg/dL Final  62/70/3500  9:38 AM 0.88  0.50 - 1.35 mg/dL Final  18/29/9371  6:96 AM 0.72  0.50 - 1.35 mg/dL Final  78/09/3808  1:75 AM 0.75  0.50 - 1.35 mg/dL Final  10/28/5850  7:78 AM 0.70  0.50 - 1.35 mg/dL Final  24/02/3534  1:44 AM 0.70  0.50 - 1.35 mg/dL Final  31/05/4006  6:76 AM 0.75  0.50 - 1.35 mg/dL Final  19/05/930  6:71 AM 0.74  0.50 - 1.35 mg/dL Final  24/05/8097  8:33 AM 0.87  0.50 - 1.35 mg/dL Final  82/05/537  7:67 AM 0.88  0.50 - 1.35 mg/dL Final  34/01/9377 02:40 PM 0.86  0.50 - 1.35 mg/dL Final  10/03/3530  9:92 PM 0.7  0.4 - 1.5 mg/dL Final  05/22/8339  9:62 PM 0.9  0.4 - 1.5 mg/dL Final  02/28/9796  9:21 PM 0.89  0.50 - 1.35 mg/dL Final  02/04/4172  0:81 PM 0.8  0.4 -  1.5 mg/dL Final  1/61/0960  4:54 AM 0.91  0.50 - 1.35 mg/dL Final  09/81/1914  7:82 PM 0.9  0.4 - 1.5 mg/dL Final  9/56/2130  8:65 AM 0.8  0.4 - 1.5 mg/dL Final  08/03/4694  2:95 PM 0.89  0.50 - 1.35 mg/dL Final  2/84/1324  4:01 PM 1.1  0.4 - 1.5 mg/dL Final  0/02/7251 66:44 PM 0.91  0.4 - 1.5 mg/dL Final  0/03/4740  5:95 PM 1.1  0.4 - 1.5 mg/dL Final  6/38/7564 33:29 AM 0.96  0.40 - 1.50 mg/dL Final  06/13/8414  6:06 PM 1.02  0.40 - 1.50 mg/dL Final  03/26/6008 93:23 AM 0.9  0.4-1.5 mg/dL Final  5/57/3220  2:54 PM 0.96  0.40 - 1.50 mg/dL Final  2/70/6237  6:28 AM 0.99  0.4 - 1.5 mg/dL Final  04/10/1759 60:73 AM 0.95  0.4 - 1.5 mg/dL Final  08/05/6267  4:85 PM 0.96  0.40 - 1.50 mg/dL Final  4/62/7035 00:93 AM 0.92  0.40 - 1.50 mg/dL Final  81/82/9937 16:96 PM 1.0  0.4 - 1.5 mg/dL Final  78/93/8101 75:10 PM 1.31  0.4 - 1.5 mg/dL Final  25/85/2778  2:42 PM 0.89  0.4 - 1.5 mg/dL Final  35/36/1443 15:40 AM 0.94  0.40 - 1.50 mg/dL Final  08/31/7617  50:93 AM 0.97  0.4 - 1.5 mg/dL Final  26/07/1243  8:09 AM 1.02  0.4 - 1.5 mg/dL Final  98/03/3823 05:39 PM 0.96  0.4 - 1.5 mg/dL Final  08/01/7339  9:37 PM 1.1  0.4-1.5 mg/dL Final  9/0/2409  7:35 AM 0.94  0.40 - 1.50 mg/dL Final  03/28/9922 26:83 AM 1.13  0.40 - 1.50 mg/dL Final  41/96/2229 79:89 AM 0.9  0.4-1.5 mg/dL Final  03/09/9415 40:81 AM 1.02  0.40 - 1.50 mg/dL Final   Sodium  Date/Time Value Range Status  12/11/2012  8:27 AM 124* 135 - 145 mEq/L Final  12/10/2012  3:50 AM 127* 135 - 145 mEq/L Final  12/09/2012  4:55 AM 128* 135 - 145 mEq/L Final  12/08/2012  5:08 AM 126* 135 - 145 mEq/L Final  12/07/2012  7:58 PM 122* 135 - 145 mEq/L Final  12/07/2012  1:33 PM 121* 135 - 145 mEq/L Final  12/07/2012  8:24 AM 123* 135 - 145 mEq/L Final  12/07/2012  1:47 AM 122* 135 - 145 mEq/L Final  12/06/2012  6:50 PM 120* 135 - 145 mEq/L Final  11/08/2012  5:40 AM 134* 135 - 145 mEq/L Final  11/07/2012  4:52 AM 131* 135 - 145 mEq/L Final  11/06/2012  5:25 AM 132* 135 - 145 mEq/L Final  11/04/2012  5:50 AM 133* 135 - 145 mEq/L Final  11/03/2012  4:10 AM 136  135 - 145 mEq/L Final  11/02/2012  4:45 AM 132* 135 - 145 mEq/L Final  11/01/2012  4:37 AM 129* 135 - 145 mEq/L Final  10/31/2012  4:25 AM 129* 135 - 145 mEq/L Final  10/30/2012  7:23 AM 126* 135 - 145 mEq/L Final  10/29/2012  5:10 AM 126* 135 - 145 mEq/L Final  10/28/2012  6:55 AM 127* 135 - 145 mEq/L Final  10/27/2012 11:30 PM 131* 135 - 145 mEq/L Final  09/30/2012  2:04 PM 132* 135 - 145 mEq/L Final  08/10/2012 11:17 AM 132 Repeated and Verified* 136 - 145 mEq/L Final  06/08/2012 12:57 PM 136  136 - 145 mEq/L Final  02/15/2012  1:56 PM 135  135 - 145 mEq/L Final  12/22/2011  1:35 PM  137  136 - 145 mEq/L Final  10/21/2011 12:05 PM 137  136 - 145 mEq/L Final  07/29/2011  1:08 PM 139  135 - 145 mEq/L Final  07/03/2011  3:42 PM 135  135 - 145 mEq/L Final  05/07/2011  9:59 AM 139  135 - 145 mEq/L Final  01/07/2011  3:04 PM 139  135 - 145 mEq/L Final   10/06/2010  9:31 AM 140  135 - 145 mEq/L Final  07/29/2010  2:16 PM 142  135 - 145 mEq/L Final  06/06/2010  4:44 PM 136  135 - 145 mEq/L Final  04/28/2010 10:40 PM 136  135 - 145 mEq/L Final  04/28/2010  7:40 PM 135  135 - 145 mEq/L Final  02/13/2010 11:22 AM 136  135 - 145 mEq/L Final  08/21/2009  1:07 PM 139  135 - 145 mEq/L Final  08/06/2009 10:03 AM 142  135-145 meq/L Final  04/25/2009  2:13 PM 136  135 - 145 mEq/L Final  04/15/2009  8:20 AM 140  135 - 145 mEq/L Final  04/12/2009 12:53 AM 134* 135 - 145 mEq/L Final  03/12/2009  2:59 PM 141  135 - 145 mEq/L Final  02/07/2009 11:04 AM 139  135 - 145 mEq/L Final  12/10/2008 10:30 PM 139  135 - 145 mEq/L Final  12/10/2008 10:15 PM 136  135 - 145 mEq/L Final  11/10/2008  7:12 PM 139  135 - 145 mEq/L Final  11/07/2008 11:20 AM 138  135 - 145 mEq/L Final  10/31/2008 10:25 AM 140  135 - 145 mEq/L Final  10/31/2008  6:10 AM 142  135 - 145 mEq/L Final  10/30/2008 12:40 PM 140  135 - 145 mEq/L Final  10/02/2008  4:56 PM 141  135-145 meq/L Final  07/02/2008  9:08 AM 141  135 - 145 mEq/L Final  02/01/2008 11:21 AM 141  135 - 145 mEq/L Final  11/08/2007 12:00 AM 141  135-145 meq/L Final  10/17/2007 10:07 AM 141  135 - 145 mEq/L Final   PMH:   Past Medical History  Diagnosis Date  . Dyspnea   . 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   . Allergic rhinitis   . Hyperlipidemia   . Exogenous obesity   . Edema     venous insufficiency  . Diabetes mellitus 2012    T2DM  . Second degree Mobitz II AV block 12/11/08    with transient syncope, resolved s/p PPM  . Paroxysmal atrial fibrillation 03/16/11    diagnosed by PPM interrogation  . Pancytopenia   . Postural dizziness   . CHF (congestive heart failure)     PSH:   Past Surgical History  Procedure Laterality Date  . Lymph node biopsy      groin  . Sternotomy  2000  . Pericardiectomy  2000  . Penile prosthesis implant      and removal  . Pacemaker  insertion  12/11/08    MDT implanted by Dr Johney Frame  . Tee without cardioversion N/A 10/31/2012    Procedure: TRANSESOPHAGEAL ECHOCARDIOGRAM (TEE);  Surgeon: Lewayne Bunting, MD;  Location: [facility] ENDOSCOPY;  Service: Cardiovascular;  Laterality: N/A;  . Pacemaker lead removal Left 11/04/2012    Procedure: PACEMAKER LEAD REMOVAL;  Surgeon: Marinus Maw, MD;  Location: [facility] OR;  Service: Cardiovascular;  Laterality: Left;  . Temporary pacemaker insertion Left 11/04/2012    Procedure: TEMPORARY PACEMAKER INSERTION;  Surgeon: Doylene Canning  Ladona Ridgel, MD;  Location: [facility] OR;  Service: Cardiovascular;  Laterality: Left;  . Insert / replace / remove pacemaker      Allergies: No Known Allergies  Medications:   Prior to Admission medications   Medication Sig Start Date End Date Taking? Authorizing Provider  acetaminophen (TYLENOL) 500 MG tablet Take 500-1,000 mg by mouth every 6 (six) hours as needed.    Yes Historical Provider, MD  albuterol (PROVENTIL HFA;VENTOLIN HFA) 108 (90 BASE) MCG/ACT inhaler Inhale 2 puffs into the lungs every 4 (four) hours as needed for wheezing or shortness of breath.    Yes Historical Provider, MD  aspirin 81 MG tablet Take 81 mg by mouth every morning. (hold if bleeding)   Yes Historical Provider, MD  calcium-vitamin D (OS-CAL 500 + D) 500-200 MG-UNIT per tablet Take 1 tablet by mouth 2 (two) times daily.    Yes Historical Provider, MD  ceFAZolin (ANCEF) 2-3 GM-% SOLR Inject 50 mLs (2 g total) into the vein every 8 (eight) hours. For 6 weeks, STOP DATE 12/16/12 11/08/12  Yes Ripudeep Jenna Luo, MD  cetirizine (ZYRTEC) 10 MG tablet Take 10 mg by mouth at bedtime as needed for allergies.   Yes Historical Provider, MD  Cholecalciferol (VITAMIN D) 1000 UNITS capsule Take 1,000 Units by mouth 2 (two) times daily.     Yes Historical Provider, MD  dextromethorphan-guaiFENesin (MUCINEX DM) 30-600 MG per 12 hr tablet Take 1 tablet by mouth every 12 (twelve) hours as needed (cough).  06/26/10  Yes  Tammy S Parrett, NP  escitalopram (LEXAPRO) 20 MG tablet Take 10 mg by mouth at bedtime.    Yes Historical Provider, MD  famotidine (PEPCID) 20 MG tablet Take 20 mg by mouth at bedtime.    Yes Historical Provider, MD  finasteride (PROSCAR) 5 MG tablet Take 5 mg by mouth every morning.    Yes Historical Provider, MD  furosemide (LASIX) 20 MG tablet Take 20 mg by mouth daily. May take 1 extra daily if needed for swelling in legs 07/12/12  Yes Nyoka Cowden, MD  hydrocortisone cream 1 % Apply 1 application topically 2 (two) times daily as needed (itchy rash).    Yes Historical Provider, MD  metFORMIN (GLUCOPHAGE) 500 MG tablet Take 500 mg by mouth 2 (two) times daily with a meal.  06/23/11  Yes Tammy S Parrett, NP  methylcellulose (CITRUCEL) oral powder 1 scoop in 8oz of water at bedtime 06/26/10  Yes Tammy S Parrett, NP  mometasone (NASONEX) 50 MCG/ACT nasal spray Place 2 sprays into the nose 2 (two) times daily.  02/23/11  Yes Tammy S Parrett, NP  mometasone-formoterol (DULERA) 200-5 MCG/ACT AERO Inhale 2 puffs into the lungs 2 (two) times daily.   Yes Historical Provider, MD  Multiple Vitamin (MULTIVITAMIN) tablet Take 1 tablet by mouth daily.     Yes Historical Provider, MD  omeprazole (PRILOSEC) 20 MG capsule Take 40 mg by mouth daily before breakfast.    Yes Historical Provider, MD  oxybutynin (DITROPAN) 5 MG tablet Take 10 mg by mouth every morning.    Yes Historical Provider, MD  polyethylene glycol powder (MIRALAX) powder Take 17 g by mouth at bedtime.    Yes Historical Provider, MD  potassium chloride SA (K-DUR,KLOR-CON) 20 MEQ tablet Take 20 mEq by mouth daily.   Yes Historical Provider, MD  sodium chloride 0.9 % injection 10-40 mLs by Intracatheter route as needed (flush). 11/08/12  Yes Ripudeep Jenna Luo, MD  sulindac (CLINORIL) 200 MG tablet Take  200 mg by mouth 2 (two) times daily as needed (pain).  06/24/12  Yes Nyoka Cowden, MD  traMADol (ULTRAM) 50 MG tablet Take 50-100 mg by mouth every 6  (six) hours as needed for pain (cough).   Yes Historical Provider, MD  traZODone (DESYREL) 50 MG tablet Take 50 mg by mouth at bedtime.   Yes Historical Provider, MD  ceFAZolin (ANCEF) 2-3 GM-% SOLR Inject 50 mLs (2 g total) into the vein every 8 (eight) hours. Continue IV cefazolin through 12/16/12 12/10/12   Catarina Hartshorn, MD    Discontinued Meds:   Medications Discontinued During This Encounter  Medication Reason  . polyethylene glycol powder (GLYCOLAX/MIRALAX) container 17 g   . furosemide (LASIX) injection 40 mg   . sodium chloride 0.9 % injection 3 mL   . sodium chloride 0.9 % injection 3 mL   . 0.9 %  sodium chloride infusion   . furosemide (LASIX) injection 40 mg     Social History:  reports that he quit smoking about 44 years ago. His smoking use included Cigarettes. He has a 30 pack-year smoking history. He has quit using smokeless tobacco. He reports that he does not drink alcohol or use illicit drugs.  Family History:   Family History  Problem Relation Age of Onset  . Diabetes Mother   . Liver cancer Brother   . Cancer Sister     A comprehensive review of systems was negative except for: Cardiovascular: positive for lower extremity edema Neurological: positive for memory problems  Blood pressure 125/75, pulse 70, temperature 97.5 F (36.4 C), temperature source Oral, resp. rate 18, height 5\' 11"  (1.803 m), weight 117.1 kg (258 lb 2.5 oz), SpO2 96.00%. General appearance: alert, distracted and no distress Eyes: conjunctivae/corneas clear. PERRL, EOM's intact. Fundi benign. Neck: no adenopathy, no carotid bruit, no JVD, supple, symmetrical, trachea midline and thyroid not enlarged, symmetric, no tenderness/mass/nodules Resp: clear to auscultation bilaterally Cardio: regular rate and rhythm, S1, S2 normal, no murmur, click, rub or gallop GI: soft, non-tender; bowel sounds normal; no masses,  no organomegaly and obese Extremities: extremities normal, atraumatic, no cyanosis  or edema and edema maybe trace Skin: Skin color, texture, turgor normal. No rashes or lesions Neurologic: Grossly normal but pleasantly confused  Labs: Basic Metabolic Panel:  Recent Labs Lab 12/06/12 1850  12/07/12 0824 12/07/12 1333 12/07/12 1958 12/08/12 0508 12/09/12 0455 12/10/12 0350 12/11/12 0827  NA 120*  < > 123* 121* 122* 126* 128* 127* 124*  K 4.1  < > 3.3* 3.7 3.6 3.5 3.1* 3.6 3.7  CL 87*  < > 86* 86* 87* 88* 86* 87* 85*  CO2 24  < > 28 25 26 29 31  32 29  GLUCOSE 113*  < > 132* 153* 124* 108* 121* 118* 202*  BUN 12  < > 10 10 11 11 11 15 13   CREATININE 0.67  < > 0.64 0.73 0.75 0.76 0.59 0.71 0.67  ALBUMIN 3.5  --   --   --   --   --   --   --   --   CALCIUM 9.4  < > 9.1 9.5 9.4 9.6 10.0 9.8 9.6  < > = values in this interval not displayed. Liver Function Tests:  Recent Labs Lab 12/06/12 1850  AST 17  ALT <5  ALKPHOS 82  BILITOT 0.7  PROT 6.5  ALBUMIN 3.5   No results found for this basename: LIPASE, AMYLASE,  in the last 168 hours No results  found for this basename: AMMONIA,  in the last 168 hours CBC:  Recent Labs Lab 12/05/12 1130 12/06/12 1850 12/07/12 0147 12/08/12 0508 12/09/12 0455  WBC 1.1* 1.8* 1.7* 1.6* 1.4*  NEUTROABS 0.6* 1.2*  --   --   --   HGB 8.3* 10.4* 9.7* 9.5* 10.5*  HCT 24.5* 29.1* 27.5* 27.0* 29.4*  MCV 104.1* 97.0 96.8 97.1 97.4  PLT 64* 70* 61* 66* 74*   PT/INR: @labrcntip (inr:5) Cardiac Enzymes: No results found for this basename: CKTOTAL, CKMB, CKMBINDEX, TROPONINI,  in the last 168 hours CBG:  Recent Labs Lab 12/09/12 1149 12/09/12 1657 12/09/12 2115 12/10/12 0830 12/10/12 1139  GLUCAP 131* 125* 104* 127* 113*    Iron Studies: No results found for this basename: IRON, TIBC, TRANSFERRIN, FERRITIN,  in the last 168 hours  Xrays/Other Studies: No results found.   Assessment/Plan: 79 year old male with fairly recent onset of mild hyponatremia. It was significantly low in the setting of hypervolemia. This  was managed appropriately by the primary team with improvement with the exception of the last 48 hours. 1. Hyponatremia- onset seem to be over the past summer. It possibly could have been related to the trazodone and Lexapro both of which have been discontinued. It came to a head in the setting of hypervolemia.   Initial evaluation found appropriate low urine osmolality so not consistent with SIADH. I think this patient was managed appropriately with diuresis. I honestly don't have an explanation of why sodium has worsened over the last 2 checks. Possibly now getting to a dryer place? Will hold lasix but keep fluid restriction. I will recheck urine osm although it may be skewed a little bit in the setting of Lasix. I will also check a cortisol level and will start low dose synthroid ? Maybe a stress response to all that has happened ? Hopefully this will lead to an improvement in his level.   Thank you for this consult.  I hope we will be able to help  Sylas Twombly A 12/11/2012, 2:14 PM

## 2012-12-12 ENCOUNTER — Telehealth: Payer: Self-pay | Admitting: Medical Oncology

## 2012-12-12 DIAGNOSIS — T889XXS Complication of surgical and medical care, unspecified, sequela: Secondary | ICD-10-CM

## 2012-12-12 LAB — BASIC METABOLIC PANEL
Calcium: 9.8 mg/dL (ref 8.4–10.5)
GFR calc Af Amer: >90 mL/min (ref >90)
GFR calc non Af Amer: 89 mL/min — ABNORMAL LOW (ref >90)
Glucose, Bld: 127 mg/dL — ABNORMAL HIGH (ref 70–99)
Potassium: 4 mEq/L (ref 3.5–5.1)
Sodium: 130 mEq/L — ABNORMAL LOW (ref 135–145)

## 2012-12-12 LAB — GLUCOSE, CAPILLARY
Glucose-Capillary: 141 mg/dL — ABNORMAL HIGH (ref 70–99)
Glucose-Capillary: 106 mg/dL — ABNORMAL HIGH (ref 70–99)

## 2012-12-12 LAB — CORTISOL: Cortisol, Plasma: 3.9 ug/dL

## 2012-12-12 LAB — OSMOLALITY, URINE: Osmolality, Ur: 271 mOsm/kg — ABNORMAL LOW (ref 390–1090)

## 2012-12-12 MED ORDER — HEPARIN SOD (PORK) LOCK FLUSH 100 UNIT/ML IV SOLN
250.0000 [IU] | INTRAVENOUS | Status: AC | PRN
  Administered 2012-12-12: 250 [IU]

## 2012-12-12 MED ORDER — ZOLPIDEM TARTRATE 5 MG PO TABS: 5.0000 mg | ORAL_TABLET | Freq: Every evening | ORAL | Status: DC | PRN

## 2012-12-12 MED ORDER — FUROSEMIDE 40 MG PO TABS: 40.0000 mg | ORAL_TABLET | Freq: Every day | ORAL | Status: DC

## 2012-12-12 MED ORDER — LEVOTHYROXINE SODIUM 25 MCG PO TABS: 25.0000 ug | ORAL_TABLET | Freq: Every day | ORAL | Status: DC

## 2012-12-12 NOTE — Progress Notes (Signed)
Patient discharged home with daughter, discharge instructions given and explained to patient/daughter and they verbalized understanding, patient denies any pain/distress. SKin intact, no wound noted. Accompanied home by daughter, transported to the car by staff via wheelchair.

## 2012-12-12 NOTE — Telephone Encounter (Signed)
Patient's daughter, Elba Barman, called asking to r/s patient's missed appt 12/09/12 d/t patient being in hospital. Daughter requesting appt for Thursday (20th) or Friday (21st) this week. Informed daughter MD out of office today and nurse will return her call regarding appt. after review with MD. Patient expressed thanks, denies any other questions at this time.

## 2012-12-12 NOTE — Progress Notes (Signed)
Occupational Therapy Treatment Patient Details Name: [name] MRN: [id number] DOB: 01-Jun-1932 Today's Date: 12/12/2012 Time: 1020-1055 OT Time Calculation (min): 35 min  OT Assessment / Plan / Recommendation  History of present illness Pt is a 79 y.o. male with a Past Medical History of recent MSSA bacteremia presumed secondary to infected pacemaker ( pacemaker explanted and has an external pacemaker in place) currently on Ancef therapy at home, history of follicular lymphoma in remission, chronic pancytopenia who presents today with the above noted complaint. History is obtained from the patient, son and daughter at bedside, ever since discharge from the hospital approximately a month ago patient has not been his usual self, he is much weaker than usual. However over the past 2-3 days, his weakness has worsened. He is also had worsening of his lower extremity edema. He normally weighs around 255 pounds, weight here in the emergency room was 264 pounds. Because of his worsening edema, he went to his oncologist's office yesterday and was given 2 units of blood. His weakness did not improve after transfusion, today he had a presyncopal event.   OT comments  Pt did well. May d/c today. Discussed and practiced safety with ADL.   Follow Up Recommendations  Home health OT;Supervision/Assistance - 24 hour    Barriers to Discharge       Equipment Recommendations  None recommended by OT    Recommendations for Other Services    Frequency Min 2X/week   Progress towards OT Goals Progress towards OT goals: Progressing toward goals  Plan Discharge plan remains appropriate    Precautions / Restrictions Precautions Precautions: Fall Restrictions Weight Bearing Restrictions: No   Pertinent Vitals/Pain No complaint of    ADL  Grooming: Performed;Wash/dry hands;Teeth care;Supervision/safety Where Assessed - Grooming: Unsupported standing Upper Body Bathing: Performed;Chest;Right arm;Left  arm;Abdomen;Supervision/safety (own setup) Where Assessed - Upper Body Bathing: Unsupported sitting Lower Body Bathing: Performed;Supervision/safety Where Assessed - Lower Body Bathing: Supported sit to stand Upper Body Dressing: Performed;Supervision/safety Where Assessed - Upper Body Dressing: Unsupported sitting Lower Body Dressing: Performed;Supervision/safety Where Assessed - Lower Body Dressing: Supported sit to stand Toilet Transfer: Simulated;Supervision/safety Equipment Used: Rolling walker ADL Comments: Per nursing, pt supposed to d/c today. Discussed safety for d/c home including using walker, how to manage walker safely with retrieving clothing, etc from closet and pt did so with supervision. Discussed safety with sponge bath and sitting on commode to wash at feet. He performed sponge bath and dress with supervision and did well.     OT Diagnosis:    OT Problem List:   OT Treatment Interventions:     OT Goals(current goals can now be found in the care plan section)    Visit Information  Last OT Received On: 12/12/12 Assistance Needed: +1 History of Present Illness: Pt is a 79 y.o. male with a Past Medical History of recent MSSA bacteremia presumed secondary to infected pacemaker ( pacemaker explanted and has an external pacemaker in place) currently on Ancef therapy at home, history of follicular lymphoma in remission, chronic pancytopenia who presents today with the above noted complaint. History is obtained from the patient, son and daughter at bedside, ever since discharge from the hospital approximately a month ago patient has not been his usual self, he is much weaker than usual. However over the past 2-3 days, his weakness has worsened. He is also had worsening of his lower extremity edema. He normally weighs around 255 pounds, weight here in the emergency room was 264 pounds. Because  of his worsening edema, he went to his oncologist's office yesterday and was given 2 units of  blood. His weakness did not improve after transfusion, today he had a presyncopal event.    Subjective Data      Prior Functioning       Cognition  Cognition Arousal/Alertness: Awake/alert Behavior During Therapy: WFL for tasks assessed/performed Overall Cognitive Status: Within Functional Limits for tasks assessed    Mobility  Transfers Transfers: Sit to Stand;Stand to Sit Sit to Stand: 5: Supervision;With upper extremity assist;From chair/3-in-1 Stand to Sit: 5: Supervision;With upper extremity assist;To chair/3-in-1 Details for Transfer Assistance: verbal cues for hand placement    Exercises      Balance Balance Balance Assessed: Yes Dynamic Standing Balance Dynamic Standing - Balance Support: No upper extremity supported Dynamic Standing - Level of Assistance: 5: Stand by assistance   End of Session OT - End of Session Equipment Utilized During Treatment: Rolling walker Activity Tolerance: Patient tolerated treatment well Patient left: in chair;with call bell/phone within reach  GO     Lennox Laity [phone] 12/12/2012, 11:26 AM

## 2012-12-12 NOTE — Plan of Care (Signed)
Problem: Discharge Progression Outcomes Goal: Activity appropriate for discharge plan Outcome: Progressing F/U with home health

## 2012-12-12 NOTE — Progress Notes (Signed)
  [facility] Progress Note    Subjective: na up to 130 today   Exam  Blood pressure 131/76, pulse 70, temperature 97.5 F (36.4 C), temperature source Oral, resp. rate 16, height 5\' 11"  (1.803 m), weight 108.228 kg (238 lb 9.6 oz), SpO2 99.00%. Gen : alert, elderly male, obese, no distress Neck: no JVD Resp: clear bilaterally  Cardio: regular rate and rhythm, S1, S2 normal GI: soft, nt, nd Ext: trace edema le's Neuro: grossly normal, pleasantly confused   A/P: 1. Hyponatremia- improved. Due to meds (lexapro, trazodone), hypervolemia and/or hypothyroidism. Prob from hypervolemia primarily. Improved with lasix and holding psych meds. Would keep off of lexapro and trazodone for now.  Daughter wants another sleeping pill for home, recommend Ambien or another BZD.  On synthroid which is new. He is down 12kg from admission and will need to resume outpatient diuretic; was on lasix 20-40mg  once daily at home, would continue this at minimum upon d/c. Will sign off.  2. MSSA pacemaker infection, s/p explantation 3. DM 4. Hypothyroid      Vinson Moselle MD  pager [phone]    cell [phone]  12/12/2012, 1:11 PM   Recent Labs Lab 12/10/12 0350 12/11/12 0827 12/12/12 0500  NA 127* 124* 130*  K 3.6 3.7 4.0  CL 87* 85* 90*  CO2 32 29 32  GLUCOSE 118* 202* 127*  BUN 15 13 12   CREATININE 0.71 0.67 0.68  CALCIUM 9.8 9.6 9.8    Recent Labs Lab 12/06/12 1850  AST 17  ALT <5  ALKPHOS 82  BILITOT 0.7  PROT 6.5  ALBUMIN 3.5    Recent Labs Lab 12/06/12 1850 12/07/12 0147 12/08/12 0508 12/09/12 0455  WBC 1.8* 1.7* 1.6* 1.4*  NEUTROABS 1.2*  --   --   --   HGB 10.4* 9.7* 9.5* 10.5*  HCT 29.1* 27.5* 27.0* 29.4*  MCV 97.0 96.8 97.1 97.4  PLT 70* 61* 66* 74*   . aspirin  81 mg Oral q morning - 10a  . calcium-vitamin D  1 tablet Oral BID  . ceFAZolin  2 g Intravenous Q8H  . cholecalciferol  1,000 Units Oral BID  . famotidine  20 mg Oral QHS  . finasteride  5  mg Oral q morning - 10a  . fluticasone  1 spray Each Nare Daily  . insulin aspart  0-9 Units Subcutaneous TID WC  . levothyroxine  25 mcg Oral QAC breakfast  . mometasone-formoterol  2 puff Inhalation BID  . multivitamin with minerals  1 tablet Oral Daily  . oxybutynin  10 mg Oral q morning - 10a  . pantoprazole  40 mg Oral Daily  . polyethylene glycol  17 g Oral QHS  . potassium chloride SA  20 mEq Oral Daily  . sodium chloride  3 mL Intravenous Q12H  . traZODone  50 mg Oral QHS     acetaminophen, acetaminophen, albuterol, loratadine, ondansetron (ZOFRAN) IV, ondansetron, sodium chloride, sodium chloride, traMADol

## 2012-12-12 NOTE — Progress Notes (Signed)
Discharged home with PICC line for home IV antibiotics, flushed by IV team.

## 2012-12-13 ENCOUNTER — Other Ambulatory Visit: Payer: Self-pay | Admitting: Oncology

## 2012-12-13 ENCOUNTER — Telehealth: Payer: Self-pay | Admitting: Internal Medicine

## 2012-12-13 ENCOUNTER — Telehealth: Payer: Self-pay | Admitting: Medical Oncology

## 2012-12-13 ENCOUNTER — Telehealth: Payer: Self-pay | Admitting: Oncology

## 2012-12-13 DIAGNOSIS — D61818 Other pancytopenia: Secondary | ICD-10-CM

## 2012-12-13 NOTE — Telephone Encounter (Signed)
Set up with Tammy with all meds in hand to verify med calendar and check the labs rec by the discharging doctor from his admit ----- Message ----- From: Catarina Hartshorn, MD Sent: 12/12/2012 2:12 PM To: Nyoka Cowden, MD    [facility] for the pt

## 2012-12-13 NOTE — Telephone Encounter (Signed)
New Problem  Pt was in the Hospital is now at home// has an external pacemaker.. Dressing is un done// request an appointment with taylor for a PM appt on 11/19 Offered appt time for 10:30 pt declined // please call back to assist

## 2012-12-13 NOTE — Telephone Encounter (Signed)
Appt scheduled with Tammy.[name]

## 2012-12-13 NOTE — Telephone Encounter (Signed)
Verified 12.4.14 ov with TP >> scheduled as a HFU Called spoke with pt's daughter [name] to ensure that pt brings all medications with him to appt [name] stated she was told to bring med calendar - advised to please bring all medication bottles [name] verbalized her understanding Will sign off

## 2012-12-13 NOTE — Telephone Encounter (Signed)
Will come in tomorrow at 2pm to see Dr Ladona Ridgel to have dressing changed and discuss plan

## 2012-12-13 NOTE — Telephone Encounter (Signed)
Duplicate msg.

## 2012-12-13 NOTE — Telephone Encounter (Signed)
Informed daughter, Per MD, patient with appt to see MD 11/20 @ 1:15, scheduling to call daughter with confirmation @ [phone]. Daughter knows to call office with any questions or concerns.

## 2012-12-13 NOTE — Telephone Encounter (Signed)
per 11/18 POF made appts for 11/20 LVMM on home ph and sw Dgt [name] to make aware of these appts shh

## 2012-12-14 ENCOUNTER — Encounter (HOSPITAL_COMMUNITY): Payer: Self-pay | Admitting: Pharmacy Technician

## 2012-12-14 ENCOUNTER — Ambulatory Visit (INDEPENDENT_AMBULATORY_CARE_PROVIDER_SITE_OTHER): Payer: Medicare Other | Admitting: Internal Medicine

## 2012-12-14 ENCOUNTER — Other Ambulatory Visit: Payer: Self-pay | Admitting: *Deleted

## 2012-12-14 ENCOUNTER — Encounter: Payer: Self-pay | Admitting: *Deleted

## 2012-12-14 ENCOUNTER — Encounter: Payer: Self-pay | Admitting: Internal Medicine

## 2012-12-14 VITALS — BP 122/69 | HR 70 | Ht 71.0 in | Wt 239.0 lb

## 2012-12-14 DIAGNOSIS — I459 Conduction disorder, unspecified: Secondary | ICD-10-CM

## 2012-12-14 DIAGNOSIS — I5033 Acute on chronic diastolic (congestive) heart failure: Secondary | ICD-10-CM

## 2012-12-14 DIAGNOSIS — Z5189 Encounter for other specified aftercare: Secondary | ICD-10-CM

## 2012-12-14 DIAGNOSIS — T827XXD Infection and inflammatory reaction due to other cardiac and vascular devices, implants and grafts, subsequent encounter: Secondary | ICD-10-CM

## 2012-12-14 NOTE — Patient Instructions (Signed)
Your physician has recommended that you have a pacemaker inserted. A pacemaker is a small device that is placed under the skin of your chest or abdomen to help control abnormal heart rhythms. This device uses electrical pulses to prompt the heart to beat at a normal rate. Pacemakers are used to treat heart rhythms that are too slow. Wire (leads) are attached to the pacemaker that goes into the chambers of you heart. This is done in the hospital and usually requires and overnight stay. Please see the instruction sheet given to you today for more information.     

## 2012-12-15 ENCOUNTER — Other Ambulatory Visit (HOSPITAL_BASED_OUTPATIENT_CLINIC_OR_DEPARTMENT_OTHER): Payer: Medicare Other | Admitting: Lab

## 2012-12-15 ENCOUNTER — Ambulatory Visit (HOSPITAL_BASED_OUTPATIENT_CLINIC_OR_DEPARTMENT_OTHER): Payer: Medicare Other | Admitting: Oncology

## 2012-12-15 ENCOUNTER — Telehealth: Payer: Self-pay | Admitting: Oncology

## 2012-12-15 ENCOUNTER — Telehealth: Payer: Self-pay | Admitting: Internal Medicine

## 2012-12-15 VITALS — BP 126/55 | HR 70 | Temp 97.8°F | Resp 20 | Ht 71.0 in | Wt 239.5 lb

## 2012-12-15 DIAGNOSIS — D61818 Other pancytopenia: Secondary | ICD-10-CM

## 2012-12-15 DIAGNOSIS — D469 Myelodysplastic syndrome, unspecified: Secondary | ICD-10-CM

## 2012-12-15 DIAGNOSIS — D472 Monoclonal gammopathy: Secondary | ICD-10-CM

## 2012-12-15 DIAGNOSIS — D649 Anemia, unspecified: Secondary | ICD-10-CM

## 2012-12-15 DIAGNOSIS — Z87898 Personal history of other specified conditions: Secondary | ICD-10-CM

## 2012-12-15 LAB — CBC WITH DIFFERENTIAL/PLATELET
Basophils Absolute: 0 10*3/uL (ref 0.0–0.1)
Eosinophils Absolute: 0 10*3/uL (ref 0.0–0.5)
HCT: 33 % — ABNORMAL LOW (ref 38.4–49.9)
HGB: 10.7 g/dL — ABNORMAL LOW (ref 13.0–17.1)
MCV: 102.8 fL — ABNORMAL HIGH (ref 79.3–98.0)
MONO%: 1.3 % (ref 0.0–14.0)
NEUT#: 1.1 10*3/uL — ABNORMAL LOW (ref 1.5–6.5)
NEUT%: 52.1 % (ref 39.0–75.0)
RDW: 26.2 % — ABNORMAL HIGH (ref 11.0–14.6)
WBC: 2.1 10*3/uL — ABNORMAL LOW (ref 4.0–10.3)

## 2012-12-15 MED ORDER — DARBEPOETIN ALFA-POLYSORBATE 300 MCG/0.6ML IJ SOLN
300.0000 ug | Freq: Once | INTRAMUSCULAR | Status: AC
  Administered 2012-12-15: 300 ug via SUBCUTANEOUS
  Filled 2012-12-15: qty 0.6

## 2012-12-15 NOTE — Progress Notes (Signed)
Hematology and Oncology Follow Up Visit  [name] [id number] 02-10-1932 79 y.o. 12/15/2012 1:19 PM  CC: [name]. Victor Sires, MD, FCCP    Principle Diagnosis: A 79 year old gentleman with the following diagnoses:   1. Follicular lymphoma as a grade 3, stage III, diagnosed 2007, continued to be in remission. 2. Pancytopenia associated with mild neutropenia and macrocytosis possibly indicating early myelodysplasia. 3. Monoclonal gammopathy of undetermined significance.  Prior Therapy:  1. Status post CHOP and rituximab.  He had complete response to therapy concluded in 2007.  No further imaging has been indicated at this time. 2. Status post bone marrow biopsy in April 2011, did not really show any evidence of myelodysplasia or metastatic lymphoma at this time.  Current therapy: Aranesp 300 mcg every 3 weeks to keep his hemoglobin close to 11.  Interim History: [name] presents today for a followup visit.  Since his last visit, he was hospitalized for bacteremia and an infected pacemaker. This have been surgically removed and receiving intravenous antibiotics. He is planned to have his pacemaker replaced in the near future. He is reporting less fatigue and decline in his health since his last visit. He reports no chest pain but DOE. Had not reported any evidence of bleeding.  Had not had any infection. His activity level is less at this time. He does not report any dysphagia.  He does not report any lymphadenopathy. No night sweats. Overall performance status, activity level is about the same.   Medications: I have reviewed the patient's current medications Current Outpatient Prescriptions  Medication Sig Dispense Refill  . acetaminophen (TYLENOL) 500 MG tablet Take 500-1,000 mg by mouth every 6 (six) hours as needed.       [name] albuterol (PROVENTIL HFA;VENTOLIN HFA) 108 (90 BASE) MCG/ACT inhaler Inhale 2 puffs into the lungs every 4 (four) hours as needed for wheezing or shortness of  breath.       [name] aspirin 81 MG tablet Take 81 mg by mouth every morning. (hold if bleeding)      . calcium-vitamin D (OS-CAL 500 + D) 500-200 MG-UNIT per tablet Take 1 tablet by mouth 2 (two) times daily.       [name] ceFAZolin (ANCEF) 2-3 GM-% SOLR Inject 50 mLs (2 g total) into the vein every 8 (eight) hours. Continue IV cefazolin through 12/16/12  18 each  0  . cetirizine (ZYRTEC) 10 MG tablet Take 10 mg by mouth at bedtime as needed for allergies.      . Cholecalciferol (VITAMIN D) 1000 UNITS capsule Take 1,000 Units by mouth 2 (two) times daily.        [name] dextromethorphan-guaiFENesin (MUCINEX DM) 30-600 MG per 12 hr tablet Take 1 tablet by mouth every 12 (twelve) hours as needed (cough).       . famotidine (PEPCID) 20 MG tablet Take 20 mg by mouth at bedtime.       . finasteride (PROSCAR) 5 MG tablet Take 5 mg by mouth every morning.       . furosemide (LASIX) 40 MG tablet Take 1 tablet (40 mg total) by mouth daily. May take 1 extra daily if needed for swelling in legs  30 tablet  0  . hydrocortisone cream 1 % Apply 1 application topically 2 (two) times daily as needed (itchy rash).       [name] levothyroxine (SYNTHROID, LEVOTHROID) 25 MCG tablet Take 1 tablet (25 mcg total) by mouth daily before breakfast.  30 tablet  0  . metFORMIN (GLUCOPHAGE)  500 MG tablet Take 500 mg by mouth 2 (two) times daily with a meal.       . methylcellulose (CITRUCEL) oral powder 1 scoop in 8oz of water at bedtime      . mometasone (NASONEX) 50 MCG/ACT nasal spray Place 2 sprays into the nose 2 (two) times daily.       . mometasone-formoterol (DULERA) 200-5 MCG/ACT AERO Inhale 2 puffs into the lungs 2 (two) times daily.      . Multiple Vitamin (MULTIVITAMIN) tablet Take 1 tablet by mouth daily.        [name] omeprazole (PRILOSEC) 20 MG capsule Take 40 mg by mouth daily before breakfast.       . oxybutynin (DITROPAN) 5 MG tablet Take 10 mg by mouth every morning.       . polyethylene glycol powder (MIRALAX) powder Take 17 g by  mouth at bedtime.       . potassium chloride SA (K-DUR,KLOR-CON) 20 MEQ tablet Take 20 mEq by mouth daily.      . sodium chloride 0.9 % injection 10-40 mLs by Intracatheter route as needed (flush).  10 mL  10  . traMADol (ULTRAM) 50 MG tablet Take 50-100 mg by mouth every 6 (six) hours as needed for pain (cough).      . zolpidem (AMBIEN) 5 MG tablet Take 1 tablet (5 mg total) by mouth at bedtime as needed for sleep.  10 tablet  0   No current facility-administered medications for this visit.     Allergies: No Known Allergies  Past Medical History, Surgical history, Social history, and Family History were reviewed and updated.  Review of Systems:  Remaining ROS negative.  Physical Exam: Blood pressure 126/55, pulse 70, temperature 97.8 F (36.6 C), temperature source Oral, resp. rate 20, height 5\' 11"  (1.803 m), weight 239 lb 8 oz (108.636 kg). ECOG: 1 General appearance: alert Head: Normocephalic, without obvious abnormality, atraumatic Neck: no adenopathy, no carotid bruit, no JVD, supple, symmetrical, trachea midline and thyroid not enlarged, symmetric, no tenderness/mass/nodules Lymph nodes: Cervical, supraclavicular, and axillary nodes normal. Heart:regular rate and rhythm, S1, S2 normal, no murmur, click, rub or gallop Lung:chest clear, no wheezing, rales, normal symmetric air entry Abdomen: soft, non-tender, without masses or organomegaly EXT:no erythema, induration, or nodules  Lab Results: Lab Results  Component Value Date   WBC 2.1* 12/15/2012   HGB 10.7* 12/15/2012   HCT 33.0* 12/15/2012   MCV 102.8* 12/15/2012   PLT 98* 12/15/2012     Chemistry      Component Value Date/Time   NA 130* 12/12/2012 0500   NA 132 Repeated and Verified* 08/10/2012 1117   K 4.0 12/12/2012 0500   K 4.1 08/10/2012 1117   CL 90* 12/12/2012 0500   CL 99 06/08/2012 1257   CO2 32 12/12/2012 0500   CO2 27 08/10/2012 1117   BUN 12 12/12/2012 0500   BUN 13.2 08/10/2012 1117   CREATININE  0.68 12/12/2012 0500   CREATININE 0.9 08/10/2012 1117      Component Value Date/Time   CALCIUM 9.8 12/12/2012 0500   CALCIUM 9.7 08/10/2012 1117   ALKPHOS 82 12/06/2012 1850   ALKPHOS 48 08/10/2012 1117   AST 17 12/06/2012 1850   AST 17 08/10/2012 1117   ALT <5 12/06/2012 1850   ALT 14 08/10/2012 1117   BILITOT 0.7 12/06/2012 1850   BILITOT 0.82 08/10/2012 1117      Impression and Plan:  This is a 79 year old gentleman with the following  issues:  1. Follicular lymphoma diagnosed in 2007. He has no evidence to suggest recurrent disease. 2. Pancytopenia due to myelodysplastic syndrome is most likely etiology. He is status post bone marrow biopsy was done on 06/22/2012.  He is to continue on Aranesp 300 mcg every 2 to 3 weeks I will also set him up with packed red cell transfusion as needed. I see no need for any packed red cell transfusions at this time. 3. Monoclonal gammopathy.  His M-spike had been less than 1 g/dL, his bone marrow biopsy continued to show a less than 8% plasma cells indicating most likely multiple myeloma.   4. Followup will be set up in 9 weeks.  Jona Erkkila 11/20/20141:19 PM

## 2012-12-15 NOTE — Patient Instructions (Signed)
Darbepoetin Alfa injection What is this medicine? DARBEPOETIN ALFA (dar be POE e tin AL fa) helps your body make more red blood cells. It is used to treat anemia caused by chronic kidney failure and chemotherapy. This medicine may be used for other purposes; ask your health care provider or pharmacist if you have questions. COMMON BRAND NAME(S): Aranesp What should I tell my health care provider before I take this medicine? They need to know if you have any of these conditions: -blood clotting disorders or history of blood clots -cancer patient not on chemotherapy -cystic fibrosis -heart disease, such as angina, heart failure, or a history of a heart attack -hemoglobin level of 12 g/dL or greater -high blood pressure -low levels of folate, iron, or vitamin B12 -seizures -an unusual or allergic reaction to darbepoetin, erythropoietin, albumin, hamster proteins, latex, other medicines, foods, dyes, or preservatives -pregnant or trying to get pregnant -breast-feeding How should I use this medicine? This medicine is for injection into a vein or under the skin. It is usually given by a health care professional in a hospital or clinic setting. If you get this medicine at home, you will be taught how to prepare and give this medicine. Do not shake the solution before you withdraw a dose. Use exactly as directed. Take your medicine at regular intervals. Do not take your medicine more often than directed. It is important that you put your used needles and syringes in a special sharps container. Do not put them in a trash can. If you do not have a sharps container, call your pharmacist or healthcare provider to get one. Talk to your pediatrician regarding the use of this medicine in children. While this medicine may be used in children as young as 1 year for selected conditions, precautions do apply. Overdosage: If you think you have taken too much of this medicine contact a poison control center or  emergency room at once. NOTE: This medicine is only for you. Do not share this medicine with others. What if I miss a dose? If you miss a dose, take it as soon as you can. If it is almost time for your next dose, take only that dose. Do not take double or extra doses. What may interact with this medicine? Do not take this medicine with any of the following medications: -epoetin alfa This list may not describe all possible interactions. Give your health care provider a list of all the medicines, herbs, non-prescription drugs, or dietary supplements you use. Also tell them if you smoke, drink alcohol, or use illegal drugs. Some items may interact with your medicine. What should I watch for while using this medicine? Visit your prescriber or health care professional for regular checks on your progress and for the needed blood tests and blood pressure measurements. It is especially important for the doctor to make sure your hemoglobin level is in the desired range, to limit the risk of potential side effects and to give you the best benefit. Keep all appointments for any recommended tests. Check your blood pressure as directed. Ask your doctor what your blood pressure should be and when you should contact him or her. As your body makes more red blood cells, you may need to take iron, folic acid, or vitamin B supplements. Ask your doctor or health care provider which products are right for you. If you have kidney disease continue dietary restrictions, even though this medication can make you feel better. Talk with your doctor or health   care professional about the foods you eat and the vitamins that you take. What side effects may I notice from receiving this medicine? Side effects that you should report to your doctor or health care professional as soon as possible: -allergic reactions like skin rash, itching or hives, swelling of the face, lips, or tongue -breathing problems -changes in vision -chest  pain -confusion, trouble speaking or understanding -feeling faint or lightheaded, falls -high blood pressure -muscle aches or pains -pain, swelling, warmth in the leg -rapid weight gain -severe headaches -sudden numbness or weakness of the face, arm or leg -trouble walking, dizziness, loss of balance or coordination -seizures (convulsions) -swelling of the ankles, feet, hands -unusually weak or tired Side effects that usually do not require medical attention (report to your doctor or health care professional if they continue or are bothersome): -diarrhea -fever, chills (flu-like symptoms) -headaches -nausea, vomiting -redness, stinging, or swelling at site where injected This list may not describe all possible side effects. Call your doctor for medical advice about side effects. You may report side effects to FDA at 1-800-FDA-1088. Where should I keep my medicine? Keep out of the reach of children. Store in a refrigerator between 2 and 8 degrees C (36 and 46 degrees F). Do not freeze. Do not shake. Throw away any unused portion if using a single-dose vial. Throw away any unused medicine after the expiration date. NOTE: This sheet is a summary. It may not cover all possible information. If you have questions about this medicine, talk to your doctor, pharmacist, or health care provider.  2014, Elsevier/Gold Standard. (2007-12-27 10:23:57)  

## 2012-12-15 NOTE — Telephone Encounter (Signed)
gv adn printed appt sched and avs for pt fro [facility] and Jan 2015

## 2012-12-15 NOTE — Telephone Encounter (Signed)
New problem   Pt was sent back home from hospital to have PT, they need to know if pt need to wait to have PT until he has his pacer place. Is the pt ready for PT. Please advise

## 2012-12-15 NOTE — Telephone Encounter (Signed)
Ok to proceed as patient can tolerate

## 2012-12-16 ENCOUNTER — Encounter: Payer: Self-pay | Admitting: Internal Medicine

## 2012-12-16 NOTE — Assessment & Plan Note (Signed)
He is nearing the end of his anti-biotic course. Will schedule new PM implant in the next few weeks.

## 2012-12-16 NOTE — Progress Notes (Signed)
HPI [name] returns today for followup. He is a 79 yo man with CHB, s/p PPM who developed staph bacteremia and underwent extraction of his PPM system and insertion of a temporary permanent PM several weeks ago. He has almost completed his anti-biotic course and returns for followup. He denies fever or chills. No syncope. He has had CHF and been in the hospital and required IV diuretics. No Known Allergies   Current Outpatient Prescriptions  Medication Sig Dispense Refill  . acetaminophen (TYLENOL) 500 MG tablet Take 500-1,000 mg by mouth every 6 (six) hours as needed.       [name] albuterol (PROVENTIL HFA;VENTOLIN HFA) 108 (90 BASE) MCG/ACT inhaler Inhale 2 puffs into the lungs every 4 (four) hours as needed for wheezing or shortness of breath.       [name] aspirin 81 MG tablet Take 81 mg by mouth every morning. (hold if bleeding)      . calcium-vitamin D (OS-CAL 500 + D) 500-200 MG-UNIT per tablet Take 1 tablet by mouth 2 (two) times daily.       [name] ceFAZolin (ANCEF) 2-3 GM-% SOLR Inject 50 mLs (2 g total) into the vein every 8 (eight) hours. Continue IV cefazolin through 12/16/12  18 each  0  . cetirizine (ZYRTEC) 10 MG tablet Take 10 mg by mouth at bedtime as needed for allergies.      . Cholecalciferol (VITAMIN D) 1000 UNITS capsule Take 1,000 Units by mouth 2 (two) times daily.        [name] dextromethorphan-guaiFENesin (MUCINEX DM) 30-600 MG per 12 hr tablet Take 1 tablet by mouth every 12 (twelve) hours as needed (cough).       . famotidine (PEPCID) 20 MG tablet Take 20 mg by mouth at bedtime.       . finasteride (PROSCAR) 5 MG tablet Take 5 mg by mouth every morning.       . furosemide (LASIX) 40 MG tablet Take 1 tablet (40 mg total) by mouth daily. May take 1 extra daily if needed for swelling in legs  30 tablet  0  . hydrocortisone cream 1 % Apply 1 application topically 2 (two) times daily as needed (itchy rash).       [name] levothyroxine (SYNTHROID, LEVOTHROID) 25 MCG tablet Take 1 tablet (25  mcg total) by mouth daily before breakfast.  30 tablet  0  . metFORMIN (GLUCOPHAGE) 500 MG tablet Take 500 mg by mouth 2 (two) times daily with a meal.       . methylcellulose (CITRUCEL) oral powder 1 scoop in 8oz of water at bedtime      . mometasone (NASONEX) 50 MCG/ACT nasal spray Place 2 sprays into the nose 2 (two) times daily.       . mometasone-formoterol (DULERA) 200-5 MCG/ACT AERO Inhale 2 puffs into the lungs 2 (two) times daily.      . Multiple Vitamin (MULTIVITAMIN) tablet Take 1 tablet by mouth daily.        [name] omeprazole (PRILOSEC) 20 MG capsule Take 40 mg by mouth daily before breakfast.       . oxybutynin (DITROPAN) 5 MG tablet Take 10 mg by mouth every morning.       . potassium chloride SA (K-DUR,KLOR-CON) 20 MEQ tablet Take 20 mEq by mouth daily.      . traMADol (ULTRAM) 50 MG tablet Take 50-100 mg by mouth every 6 (six) hours as needed for pain (cough).      . zolpidem (  AMBIEN) 5 MG tablet Take 1 tablet (5 mg total) by mouth at bedtime as needed for sleep.  10 tablet  0  . polyethylene glycol powder (MIRALAX) powder Take 17 g by mouth at bedtime.       . sodium chloride 0.9 % injection 10-40 mLs by Intracatheter route as needed (flush).  10 mL  10   No current facility-administered medications for this visit.     Past Medical History  Diagnosis Date  . Dyspnea   . 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   . Allergic rhinitis   . Hyperlipidemia   . Exogenous obesity   . Edema     venous insufficiency  . Diabetes mellitus 2012    T2DM  . Second degree Mobitz II AV block 12/11/08    with transient syncope, resolved s/p PPM  . Paroxysmal atrial fibrillation 03/16/11    diagnosed by PPM interrogation  . Pancytopenia   . Postural dizziness   . CHF (congestive heart failure)     ROS:   All systems reviewed and negative except as noted in the HPI.   Past Surgical History  Procedure Laterality Date  . Lymph  node biopsy      groin  . Sternotomy  2000  . Pericardiectomy  2000  . Penile prosthesis implant      and removal  . Pacemaker insertion  12/11/08    MDT implanted by Dr Johney Frame  . Tee without cardioversion N/A 10/31/2012    Procedure: TRANSESOPHAGEAL ECHOCARDIOGRAM (TEE);  Surgeon: Lewayne Bunting, MD;  Location: [facility] ENDOSCOPY;  Service: Cardiovascular;  Laterality: N/A;  . Pacemaker lead removal Left 11/04/2012    Procedure: PACEMAKER LEAD REMOVAL;  Surgeon: Marinus Maw, MD;  Location: [facility] OR;  Service: Cardiovascular;  Laterality: Left;  . Temporary pacemaker insertion Left 11/04/2012    Procedure: TEMPORARY PACEMAKER INSERTION;  Surgeon: Marinus Maw, MD;  Location: [facility] OR;  Service: Cardiovascular;  Laterality: Left;  . Insert / replace / remove pacemaker       Family History  Problem Relation Age of Onset  . Diabetes Mother   . Liver cancer Brother   . Cancer Sister      History   Social History  . Marital Status: Widowed    Spouse Name: N/A    Number of Children: 2  . Years of Education: N/A   Occupational History  . retired    Social History Main Topics  . Smoking status: Former Smoker -- 1.00 packs/day for 30 years    Types: Cigarettes    Quit date: 01/27/1968  . Smokeless tobacco: Former Neurosurgeon  . Alcohol Use: No  . Drug Use: No  . Sexual Activity: No   Other Topics Concern  . Not on file   Social History Narrative   Lives in [place]   No etoh           BP 122/69  Pulse 70  Ht 5\' 11"  (1.803 m)  Wt 239 lb (108.41 kg)  BMI 33.35 kg/m2  Physical Exam:  Well appearing 79 yo man, NAD HEENT: Unremarkable Neck:  No JVD, no thyromegally Back:  No CVA tenderness Lungs:  Clear with no wheezes HEART:  Regular rate rhythm, no murmurs, no rubs, no clicks Abd:  soft, positive bowel sounds, no organomegally, no rebound, no guarding Ext:  2 plus pulses, no edema, no cyanosis, no clubbing Skin:  No rashes no  nodules Neuro:  CN II through XII  intact, motor grossly intact   DEVICE  Normal device function.  See PaceArt for details.   Assess/Plan:

## 2012-12-16 NOTE — Assessment & Plan Note (Signed)
I have encouraged him to reduce his sodium intake and continue his current meds. I suspect his symptoms will be improved once his new ppm is in place.

## 2012-12-19 ENCOUNTER — Encounter: Payer: Self-pay | Admitting: Internal Medicine

## 2012-12-19 ENCOUNTER — Ambulatory Visit (INDEPENDENT_AMBULATORY_CARE_PROVIDER_SITE_OTHER): Payer: Medicare Other | Admitting: Internal Medicine

## 2012-12-19 ENCOUNTER — Other Ambulatory Visit (INDEPENDENT_AMBULATORY_CARE_PROVIDER_SITE_OTHER): Payer: Medicare Other

## 2012-12-19 ENCOUNTER — Other Ambulatory Visit: Payer: Self-pay | Admitting: Internal Medicine

## 2012-12-19 VITALS — BP 125/73 | HR 69 | Temp 97.9°F | Ht 71.0 in | Wt 235.0 lb

## 2012-12-19 DIAGNOSIS — I459 Conduction disorder, unspecified: Secondary | ICD-10-CM

## 2012-12-19 DIAGNOSIS — Z5189 Encounter for other specified aftercare: Secondary | ICD-10-CM

## 2012-12-19 DIAGNOSIS — T827XXD Infection and inflammatory reaction due to other cardiac and vascular devices, implants and grafts, subsequent encounter: Secondary | ICD-10-CM

## 2012-12-19 DIAGNOSIS — R7881 Bacteremia: Secondary | ICD-10-CM

## 2012-12-19 LAB — CBC WITH DIFFERENTIAL/PLATELET
Basophils Absolute: 0 10*3/uL (ref 0.0–0.1)
Basophils Relative: 0.5 % (ref 0.0–3.0)
Eosinophils Absolute: 0 10*3/uL (ref 0.0–0.7)
HCT: 32.2 % — ABNORMAL LOW (ref 39.0–52.0)
Hemoglobin: 10.7 g/dL — ABNORMAL LOW (ref 13.0–17.0)
Lymphs Abs: 1 10*3/uL (ref 0.7–4.0)
MCHC: 33.3 g/dL (ref 30.0–36.0)
MCV: 103 fl — ABNORMAL HIGH (ref 78.0–100.0)
Monocytes Relative: 0.9 % — ABNORMAL LOW (ref 3.0–12.0)
Neutro Abs: 0.8 10*3/uL — ABNORMAL LOW (ref 1.4–7.7)
Platelets: 119 10*3/uL — ABNORMAL LOW (ref 150.0–400.0)
RDW: 26.8 % — ABNORMAL HIGH (ref 11.5–14.6)

## 2012-12-19 LAB — BASIC METABOLIC PANEL
CO2: 28 mEq/L (ref 19–32)
Chloride: 95 mEq/L — ABNORMAL LOW (ref 96–112)
Glucose, Bld: 111 mg/dL — ABNORMAL HIGH (ref 70–99)
Potassium: 4 mEq/L (ref 3.5–5.1)
Sodium: 132 mEq/L — ABNORMAL LOW (ref 135–145)

## 2012-12-19 NOTE — Assessment & Plan Note (Signed)
He is doing well and has completed 6 weeks of therapy. I will recheck blood cultures to be sure they are negative and I will pull his PICC line today. If the blood cultures remain negative he will have his pacemaker replaced which looks like will be on December 4. He will be called if there are any concerns otherwise he will return on a p.r.n. Basis.

## 2012-12-19 NOTE — Progress Notes (Signed)
  Subjective:    Patient ID: [name], male    DOB: 1932-12-26, 79 y.o.   MRN: [id number]  HPI He comes in for followup of MSSA bacteremia with a pacemaker removal. 3 days ago he finished a 6 week course of IV cefazolin. He has had no fever or chills and no new problems. His PICC line worked well. He is planned to get a reimplantation of the pacemaker on December 4.  He has had no rash or diarrhea. He has not missed any doses. He overall feels well. He is here with his daughter.   Review of Systems  Constitutional: Negative for fever, chills and fatigue.  Eyes: Negative for visual disturbance.  Respiratory: Negative for cough.   Cardiovascular: Negative for chest pain.  Gastrointestinal: Negative for diarrhea.  Endocrine: Negative for polyuria.  Skin: Negative for rash.  Neurological: Negative for dizziness and light-headedness.  Hematological: Negative for adenopathy.  Psychiatric/Behavioral: Negative for dysphoric mood.       Objective:   Physical Exam  Constitutional: He is oriented to person, place, and time. He appears well-developed and well-nourished.  Eyes: No scleral icterus.  Neurological: He is alert and oriented to person, place, and time.  Skin: No rash noted.  Psychiatric: He has a normal mood and affect. His behavior is normal.          Assessment & Plan:

## 2012-12-25 LAB — CULTURE, BLOOD (SINGLE): Organism ID, Bacteria: NO GROWTH

## 2012-12-26 ENCOUNTER — Encounter: Payer: Self-pay | Admitting: Internal Medicine

## 2012-12-27 ENCOUNTER — Other Ambulatory Visit: Payer: Self-pay | Admitting: Oncology

## 2012-12-27 ENCOUNTER — Telehealth: Payer: Self-pay | Admitting: *Deleted

## 2012-12-27 NOTE — Progress Notes (Signed)
Letter dictate.

## 2012-12-28 MED ORDER — CEFAZOLIN SODIUM-DEXTROSE 2-3 GM-% IV SOLR
2.0000 g | INTRAVENOUS | Status: DC
  Filled 2012-12-28: qty 50

## 2012-12-28 MED ORDER — GENTAMICIN SULFATE 40 MG/ML IJ SOLN
80.0000 mg | INTRAMUSCULAR | Status: DC
  Filled 2012-12-28 (×2): qty 2

## 2012-12-28 NOTE — Telephone Encounter (Signed)
Daughter calling to request letter from dr Clelia Croft, stating she is primary care giver for her father. Note to dr Clelia Croft.

## 2012-12-29 ENCOUNTER — Inpatient Hospital Stay: Payer: Medicare Other | Admitting: Adult Health

## 2012-12-29 ENCOUNTER — Ambulatory Visit (HOSPITAL_COMMUNITY)
Admission: RE | Admit: 2012-12-29 | Discharge: 2012-12-30 | Disposition: A | Discharge status: Home / Self Care | Payer: Medicare Other | Source: Ambulatory Visit | Attending: Internal Medicine | Admitting: Internal Medicine

## 2012-12-29 ENCOUNTER — Encounter (HOSPITAL_COMMUNITY)
Admission: RE | Disposition: A | Discharge status: Home / Self Care | Payer: Self-pay | Source: Ambulatory Visit | Attending: Internal Medicine

## 2012-12-29 ENCOUNTER — Encounter (HOSPITAL_COMMUNITY): Payer: Self-pay | Admitting: *Deleted

## 2012-12-29 DIAGNOSIS — I442 Atrioventricular block, complete: Secondary | ICD-10-CM

## 2012-12-29 DIAGNOSIS — I4891 Unspecified atrial fibrillation: Secondary | ICD-10-CM

## 2012-12-29 DIAGNOSIS — Z87891 Personal history of nicotine dependence: Secondary | ICD-10-CM | POA: Insufficient documentation

## 2012-12-29 DIAGNOSIS — I5032 Chronic diastolic (congestive) heart failure: Secondary | ICD-10-CM | POA: Insufficient documentation

## 2012-12-29 DIAGNOSIS — D539 Nutritional anemia, unspecified: Secondary | ICD-10-CM

## 2012-12-29 DIAGNOSIS — E785 Hyperlipidemia, unspecified: Secondary | ICD-10-CM | POA: Insufficient documentation

## 2012-12-29 DIAGNOSIS — Z7982 Long term (current) use of aspirin: Secondary | ICD-10-CM | POA: Insufficient documentation

## 2012-12-29 DIAGNOSIS — D649 Anemia, unspecified: Secondary | ICD-10-CM

## 2012-12-29 DIAGNOSIS — D61818 Other pancytopenia: Secondary | ICD-10-CM | POA: Insufficient documentation

## 2012-12-29 DIAGNOSIS — C8595 Non-Hodgkin lymphoma, unspecified, lymph nodes of inguinal region and lower limb: Secondary | ICD-10-CM | POA: Insufficient documentation

## 2012-12-29 DIAGNOSIS — I509 Heart failure, unspecified: Secondary | ICD-10-CM | POA: Insufficient documentation

## 2012-12-29 DIAGNOSIS — I459 Conduction disorder, unspecified: Secondary | ICD-10-CM

## 2012-12-29 DIAGNOSIS — E119 Type 2 diabetes mellitus without complications: Secondary | ICD-10-CM | POA: Insufficient documentation

## 2012-12-29 HISTORY — PX: BI-VENTRICULAR PACEMAKER UPGRADE: SHX5464

## 2012-12-29 HISTORY — PX: INSERT / REPLACE / REMOVE PACEMAKER: SUR710

## 2012-12-29 LAB — CBC
HCT: 30.6 % — ABNORMAL LOW (ref 39.0–52.0)
Hemoglobin: 10.7 g/dL — ABNORMAL LOW (ref 13.0–17.0)
MCHC: 35 g/dL (ref 30.0–36.0)
MCV: 101.7 fL — ABNORMAL HIGH (ref 78.0–100.0)
RBC: 3.01 MIL/uL — ABNORMAL LOW (ref 4.22–5.81)
RDW: 22.5 % — ABNORMAL HIGH (ref 11.5–15.5)
WBC: 1.7 10*3/uL — ABNORMAL LOW (ref 4.0–10.5)

## 2012-12-29 LAB — GLUCOSE, CAPILLARY
Glucose-Capillary: 112 mg/dL — ABNORMAL HIGH (ref 70–99)
Glucose-Capillary: 100 mg/dL — ABNORMAL HIGH (ref 70–99)
Glucose-Capillary: 130 mg/dL — ABNORMAL HIGH (ref 70–99)

## 2012-12-29 LAB — PROTIME-INR
INR: 1.1 (ref 0.00–1.49)
Prothrombin Time: 14 seconds (ref 11.6–15.2)

## 2012-12-29 LAB — SURGICAL PCR SCREEN
MRSA, PCR: POSITIVE — AB
Staphylococcus aureus: POSITIVE — AB

## 2012-12-29 LAB — BASIC METABOLIC PANEL
BUN: 16 mg/dL (ref 6–23)
CO2: 27 mEq/L (ref 19–32)
Calcium: 9.6 mg/dL (ref 8.4–10.5)
Chloride: 98 mEq/L (ref 96–112)
Creatinine, Ser: 0.72 mg/dL (ref 0.50–1.35)
Glucose, Bld: 126 mg/dL — ABNORMAL HIGH (ref 70–99)
Potassium: 3.8 mEq/L (ref 3.5–5.1)
Sodium: 135 mEq/L (ref 135–145)

## 2012-12-29 SURGERY — BI-VENTRICULAR PACEMAKER UPGRADE
Anesthesia: LOCAL

## 2012-12-29 MED ORDER — LIDOCAINE HCL (PF) 1 % IJ SOLN
INTRAMUSCULAR | Status: AC
  Filled 2012-12-29: qty 60

## 2012-12-29 MED ORDER — SODIUM CHLORIDE 0.9 % IV SOLN: 250.0000 mL | Freq: Once | INTRAVENOUS | Status: AC

## 2012-12-29 MED ORDER — ALBUTEROL SULFATE HFA 108 (90 BASE) MCG/ACT IN AERS
2.0000 | INHALATION_SPRAY | RESPIRATORY_TRACT | Status: DC | PRN
  Filled 2012-12-29: qty 6.7

## 2012-12-29 MED ORDER — ACETAMINOPHEN 325 MG PO TABS: 650.0000 mg | ORAL_TABLET | Freq: Once | ORAL | Status: DC

## 2012-12-29 MED ORDER — FAMOTIDINE 20 MG PO TABS
20.0000 mg | ORAL_TABLET | Freq: Every day | ORAL | Status: DC
  Administered 2012-12-29: 20 mg via ORAL
  Filled 2012-12-29 (×2): qty 1

## 2012-12-29 MED ORDER — SODIUM CHLORIDE 0.9 % IV SOLN
INTRAVENOUS | Status: DC
  Administered 2012-12-29: 50 mL/h via INTRAVENOUS

## 2012-12-29 MED ORDER — MIDAZOLAM HCL 5 MG/5ML IJ SOLN
INTRAMUSCULAR | Status: AC
  Filled 2012-12-29: qty 5

## 2012-12-29 MED ORDER — SODIUM CHLORIDE 0.9 % IJ SOLN: 10.0000 mL | INTRAMUSCULAR | Status: DC | PRN

## 2012-12-29 MED ORDER — FENTANYL CITRATE 0.05 MG/ML IJ SOLN
INTRAMUSCULAR | Status: AC
  Filled 2012-12-29: qty 2

## 2012-12-29 MED ORDER — LEVOTHYROXINE SODIUM 25 MCG PO TABS
25.0000 ug | ORAL_TABLET | Freq: Every day | ORAL | Status: DC
  Administered 2012-12-30: 25 ug via ORAL
  Filled 2012-12-29 (×2): qty 1

## 2012-12-29 MED ORDER — DM-GUAIFENESIN ER 30-600 MG PO TB12
1.0000 | ORAL_TABLET | Freq: Two times a day (BID) | ORAL | Status: DC | PRN
  Filled 2012-12-29: qty 1

## 2012-12-29 MED ORDER — ZOLPIDEM TARTRATE 5 MG PO TABS
5.0000 mg | ORAL_TABLET | Freq: Every evening | ORAL | Status: DC | PRN
  Administered 2012-12-29: 5 mg via ORAL
  Filled 2012-12-29: qty 1

## 2012-12-29 MED ORDER — SODIUM CHLORIDE 0.9 % IJ SOLN: 3.0000 mL | INTRAMUSCULAR | Status: DC | PRN

## 2012-12-29 MED ORDER — PANTOPRAZOLE SODIUM 40 MG PO TBEC
40.0000 mg | DELAYED_RELEASE_TABLET | Freq: Every day | ORAL | Status: DC
  Administered 2012-12-29 – 2012-12-30 (×2): 40 mg via ORAL
  Filled 2012-12-29 (×2): qty 1

## 2012-12-29 MED ORDER — DIPHENHYDRAMINE HCL 25 MG PO CAPS: 25.0000 mg | ORAL_CAPSULE | Freq: Once | ORAL | Status: DC

## 2012-12-29 MED ORDER — MOMETASONE FURO-FORMOTEROL FUM 200-5 MCG/ACT IN AERO
2.0000 | INHALATION_SPRAY | Freq: Two times a day (BID) | RESPIRATORY_TRACT | Status: DC
  Administered 2012-12-29 – 2012-12-30 (×2): 2 via RESPIRATORY_TRACT
  Filled 2012-12-29: qty 8.8

## 2012-12-29 MED ORDER — HEPARIN SOD (PORK) LOCK FLUSH 100 UNIT/ML IV SOLN
250.0000 [IU] | INTRAVENOUS | Status: DC | PRN
  Filled 2012-12-29: qty 3

## 2012-12-29 MED ORDER — ACETAMINOPHEN 500 MG PO TABS: 500.0000 mg | ORAL_TABLET | Freq: Four times a day (QID) | ORAL | Status: DC | PRN

## 2012-12-29 MED ORDER — ONDANSETRON HCL 4 MG/2ML IJ SOLN: 4.0000 mg | Freq: Four times a day (QID) | INTRAMUSCULAR | Status: DC | PRN

## 2012-12-29 MED ORDER — HEPARIN SOD (PORK) LOCK FLUSH 100 UNIT/ML IV SOLN
500.0000 [IU] | Freq: Every day | INTRAVENOUS | Status: DC | PRN
  Filled 2012-12-29: qty 5

## 2012-12-29 MED ORDER — FUROSEMIDE 40 MG PO TABS
40.0000 mg | ORAL_TABLET | Freq: Every day | ORAL | Status: DC
  Administered 2012-12-29 – 2012-12-30 (×2): 40 mg via ORAL
  Filled 2012-12-29 (×2): qty 1

## 2012-12-29 MED ORDER — MUPIROCIN 2 % EX OINT
TOPICAL_OINTMENT | CUTANEOUS | Status: AC
  Administered 2012-12-29: 1 via NASAL
  Filled 2012-12-29: qty 22

## 2012-12-29 MED ORDER — CHLORHEXIDINE GLUCONATE 4 % EX LIQD
60.0000 mL | Freq: Once | CUTANEOUS | Status: DC
  Filled 2012-12-29: qty 60

## 2012-12-29 MED ORDER — METFORMIN HCL 500 MG PO TABS
500.0000 mg | ORAL_TABLET | Freq: Two times a day (BID) | ORAL | Status: DC
  Administered 2012-12-29 – 2012-12-30 (×2): 500 mg via ORAL
  Filled 2012-12-29 (×4): qty 1

## 2012-12-29 MED ORDER — CEFAZOLIN SODIUM-DEXTROSE 2-3 GM-% IV SOLR
2.0000 g | Freq: Four times a day (QID) | INTRAVENOUS | Status: AC
  Administered 2012-12-29 – 2012-12-30 (×3): 2 g via INTRAVENOUS
  Filled 2012-12-29 (×5): qty 50

## 2012-12-29 MED ORDER — MUPIROCIN 2 % EX OINT
TOPICAL_OINTMENT | Freq: Two times a day (BID) | CUTANEOUS | Status: DC
  Administered 2012-12-29: 20:00:00 via NASAL
  Administered 2012-12-30: 1 via NASAL
  Filled 2012-12-29: qty 22

## 2012-12-29 MED ORDER — POLYETHYLENE GLYCOL 3350 17 G PO PACK
17.0000 g | PACK | Freq: Every day | ORAL | Status: DC
  Filled 2012-12-29 (×2): qty 1

## 2012-12-29 MED ORDER — FINASTERIDE 5 MG PO TABS
5.0000 mg | ORAL_TABLET | Freq: Every morning | ORAL | Status: DC
  Administered 2012-12-30: 5 mg via ORAL
  Filled 2012-12-29: qty 1

## 2012-12-29 MED ORDER — CALCIUM CARBONATE-VITAMIN D 500-200 MG-UNIT PO TABS
1.0000 | ORAL_TABLET | Freq: Two times a day (BID) | ORAL | Status: DC
  Administered 2012-12-29 – 2012-12-30 (×2): 1 via ORAL
  Filled 2012-12-29 (×3): qty 1

## 2012-12-29 MED ORDER — POLYETHYLENE GLYCOL 3350 17 GM/SCOOP PO POWD
17.0000 g | Freq: Every day | ORAL | Status: DC
  Filled 2012-12-29: qty 255

## 2012-12-29 MED ORDER — POTASSIUM CHLORIDE CRYS ER 20 MEQ PO TBCR
20.0000 meq | EXTENDED_RELEASE_TABLET | Freq: Every day | ORAL | Status: DC
  Administered 2012-12-29 – 2012-12-30 (×2): 20 meq via ORAL
  Filled 2012-12-29 (×2): qty 1

## 2012-12-29 MED ORDER — ACETAMINOPHEN 325 MG PO TABS: 325.0000 mg | ORAL_TABLET | ORAL | Status: DC | PRN

## 2012-12-29 MED ORDER — OXYBUTYNIN CHLORIDE 5 MG PO TABS
10.0000 mg | ORAL_TABLET | Freq: Every morning | ORAL | Status: DC
  Administered 2012-12-30: 10 mg via ORAL
  Filled 2012-12-29: qty 2

## 2012-12-29 NOTE — CV Procedure (Signed)
DDD PM inserted via the right subclavian vein without immediate complication. J#[id number].

## 2012-12-29 NOTE — H&P (View-Only) (Signed)
    HPI [name] returns today for followup. He is a 79 yo man with CHB, s/p PPM who developed staph bacteremia and underwent extraction of his PPM system and insertion of a temporary permanent PM several weeks ago. He has almost completed his anti-biotic course and returns for followup. He denies fever or chills. No syncope. He has had CHF and been in the hospital and required IV diuretics. No Known Allergies   Current Outpatient Prescriptions  Medication Sig Dispense Refill  . acetaminophen (TYLENOL) 500 MG tablet Take 500-1,000 mg by mouth every 6 (six) hours as needed.       . albuterol (PROVENTIL HFA;VENTOLIN HFA) 108 (90 BASE) MCG/ACT inhaler Inhale 2 puffs into the lungs every 4 (four) hours as needed for wheezing or shortness of breath.       . aspirin 81 MG tablet Take 81 mg by mouth every morning. (hold if bleeding)      . calcium-vitamin D (OS-CAL 500 + D) 500-200 MG-UNIT per tablet Take 1 tablet by mouth 2 (two) times daily.       . ceFAZolin (ANCEF) 2-3 GM-% SOLR Inject 50 mLs (2 g total) into the vein every 8 (eight) hours. Continue IV cefazolin through 12/16/12  18 each  0  . cetirizine (ZYRTEC) 10 MG tablet Take 10 mg by mouth at bedtime as needed for allergies.      . Cholecalciferol (VITAMIN D) 1000 UNITS capsule Take 1,000 Units by mouth 2 (two) times daily.        . dextromethorphan-guaiFENesin (MUCINEX DM) 30-600 MG per 12 hr tablet Take 1 tablet by mouth every 12 (twelve) hours as needed (cough).       . famotidine (PEPCID) 20 MG tablet Take 20 mg by mouth at bedtime.       . finasteride (PROSCAR) 5 MG tablet Take 5 mg by mouth every morning.       . furosemide (LASIX) 40 MG tablet Take 1 tablet (40 mg total) by mouth daily. May take 1 extra daily if needed for swelling in legs  30 tablet  0  . hydrocortisone cream 1 % Apply 1 application topically 2 (two) times daily as needed (itchy rash).       . levothyroxine (SYNTHROID, LEVOTHROID) 25 MCG tablet Take 1 tablet (25  mcg total) by mouth daily before breakfast.  30 tablet  0  . metFORMIN (GLUCOPHAGE) 500 MG tablet Take 500 mg by mouth 2 (two) times daily with a meal.       . methylcellulose (CITRUCEL) oral powder 1 scoop in 8oz of water at bedtime      . mometasone (NASONEX) 50 MCG/ACT nasal spray Place 2 sprays into the nose 2 (two) times daily.       . mometasone-formoterol (DULERA) 200-5 MCG/ACT AERO Inhale 2 puffs into the lungs 2 (two) times daily.      . Multiple Vitamin (MULTIVITAMIN) tablet Take 1 tablet by mouth daily.        . omeprazole (PRILOSEC) 20 MG capsule Take 40 mg by mouth daily before breakfast.       . oxybutynin (DITROPAN) 5 MG tablet Take 10 mg by mouth every morning.       . potassium chloride SA (K-DUR,KLOR-CON) 20 MEQ tablet Take 20 mEq by mouth daily.      . traMADol (ULTRAM) 50 MG tablet Take 50-100 mg by mouth every 6 (six) hours as needed for pain (cough).      . zolpidem (  AMBIEN) 5 MG tablet Take 1 tablet (5 mg total) by mouth at bedtime as needed for sleep.  10 tablet  0  . polyethylene glycol powder (MIRALAX) powder Take 17 g by mouth at bedtime.       . sodium chloride 0.9 % injection 10-40 mLs by Intracatheter route as needed (flush).  10 mL  10   No current facility-administered medications for this visit.     Past Medical History  Diagnosis Date  . Dyspnea   . 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   . Allergic rhinitis   . Hyperlipidemia   . Exogenous obesity   . Edema     venous insufficiency  . Diabetes mellitus 2012    T2DM  . Second degree Mobitz II AV block 12/11/08    with transient syncope, resolved s/p PPM  . Paroxysmal atrial fibrillation 03/16/11    diagnosed by PPM interrogation  . Pancytopenia   . Postural dizziness   . CHF (congestive heart failure)     ROS:   All systems reviewed and negative except as noted in the HPI.   Past Surgical History  Procedure Laterality Date  . Lymph  node biopsy      groin  . Sternotomy  2000  . Pericardiectomy  2000  . Penile prosthesis implant      and removal  . Pacemaker insertion  12/11/08    MDT implanted by Dr Allred  . Tee without cardioversion N/A 10/31/2012    Procedure: TRANSESOPHAGEAL ECHOCARDIOGRAM (TEE);  Surgeon: Brian S Crenshaw, MD;  Location: [facility];  Service: Cardiovascular;  Laterality: N/A;  . Pacemaker lead removal Left 11/04/2012    Procedure: PACEMAKER LEAD REMOVAL;  Surgeon: Gregg W Taylor, MD;  Location: [facility] OR;  Service: Cardiovascular;  Laterality: Left;  . Temporary pacemaker insertion Left 11/04/2012    Procedure: TEMPORARY PACEMAKER INSERTION;  Surgeon: Gregg W Taylor, MD;  Location: [facility] OR;  Service: Cardiovascular;  Laterality: Left;  . Insert / replace / remove pacemaker       Family History  Problem Relation Age of Onset  . Diabetes Mother   . Liver cancer Brother   . Cancer Sister      History   Social History  . Marital Status: Widowed    Spouse Name: N/A    Number of Children: 2  . Years of Education: N/A   Occupational History  . retired    Social History Main Topics  . Smoking status: Former Smoker -- 1.00 packs/day for 30 years    Types: Cigarettes    Quit date: 01/27/1968  . Smokeless tobacco: Former User  . Alcohol Use: No  . Drug Use: No  . Sexual Activity: No   Other Topics Concern  . Not on file   Social History Narrative   Lives in [place]   No etoh           BP 122/69  Pulse 70  Ht 5' 11" (1.803 m)  Wt 239 lb (108.41 kg)  BMI 33.35 kg/m2  Physical Exam:  Well appearing 79 yo man, NAD HEENT: Unremarkable Neck:  No JVD, no thyromegally Back:  No CVA tenderness Lungs:  Clear with no wheezes HEART:  Regular rate rhythm, no murmurs, no rubs, no clicks Abd:  soft, positive bowel sounds, no organomegally, no rebound, no guarding Ext:  2 plus pulses, no edema, no cyanosis, no clubbing Skin:  No rashes no   nodules Neuro:  CN II through XII  intact, motor grossly intact   DEVICE  Normal device function.  See PaceArt for details.   Assess/Plan: 

## 2012-12-29 NOTE — Interval H&P Note (Signed)
History and Physical Interval Note: Patient seen and examined. Agree with above. Since my clinic visit, no change in the history, physical exam, assessment and plan.  12/29/2012 1:10 PM  [name]  has presented today for surgery, with the diagnosis of Heart block  The various methods of treatment have been discussed with the patient and family. After consideration of risks, benefits and other options for treatment, the patient has consented to  Procedure(s): BI-VENTRICULAR PACEMAKER UPGRADE (N/A) as a surgical intervention .  The patient's history has been reviewed, patient examined, no change in status, stable for surgery.  I have reviewed the patient's chart and labs.  Questions were answered to the patient's satisfaction.     Leonia Reeves.D.

## 2012-12-30 ENCOUNTER — Ambulatory Visit (HOSPITAL_COMMUNITY): Payer: Medicare Other

## 2012-12-30 DIAGNOSIS — I459 Conduction disorder, unspecified: Secondary | ICD-10-CM

## 2012-12-30 DIAGNOSIS — I442 Atrioventricular block, complete: Secondary | ICD-10-CM

## 2012-12-30 DIAGNOSIS — I4891 Unspecified atrial fibrillation: Secondary | ICD-10-CM

## 2012-12-30 NOTE — Progress Notes (Signed)
Nutrition Brief Note  Patient identified on the Malnutrition Screening Tool (MST) Report for weight loss. Patient reports 23 lb weight loss a few weeks ago due to fluids. Has been eating well.  Wt Readings from Last 15 Encounters:  12/29/12 227 lb (102.967 kg)  12/29/12 227 lb (102.967 kg)  12/19/12 235 lb (106.595 kg)  12/15/12 239 lb 8 oz (108.636 kg)  12/14/12 239 lb (108.41 kg)  12/12/12 238 lb 9.6 oz (108.228 kg)  11/24/12 256 lb (116.121 kg)  11/23/12 252 lb (114.306 kg)  11/22/12 252 lb (114.306 kg)  11/08/12 246 lb 1.6 oz (111.63 kg)  11/08/12 246 lb 1.6 oz (111.63 kg)  11/08/12 246 lb 1.6 oz (111.63 kg)  10/14/12 258 lb 3.2 oz (117.119 kg)  09/30/12 247 lb (112.038 kg)  09/19/12 261 lb (118.389 kg)    Body mass index is 31.67 kg/(m^2). Patient meets criteria for obesity, class 1 based on current BMI.   Current diet order is CHO-modified, patient is consuming approximately 75-100% of meals at this time. Labs and medications reviewed.   No nutrition interventions warranted at this time. If nutrition issues arise, please consult RD.   Joaquin Courts, RD, LDN, CNSC Pager [phone] After Hours Pager [phone]

## 2012-12-30 NOTE — Op Note (Signed)
NAMELANE, ELAND NO.:  [id number]  MEDICAL RECORD NO.:  [id number]  LOCATION:  3W29C                        FACILITY:  [facility]  PHYSICIAN:  Doylene Canning. Ladona Ridgel, MD    DATE OF BIRTH:  06-03-1932  DATE OF PROCEDURE:  12/29/2012 DATE OF DISCHARGE:                              OPERATIVE REPORT   PROCEDURE PERFORMED:  Insertion of a dual-chamber pacemaker followed by removal of a previously implanted temporary and permanent transvenous pacemaker utilizing upper extremity contrast venography.  INTRODUCTION:  The patient is a 79 year old man with a history of complete heart block and diastolic heart failure.  Ejection fraction is normal.  He developed Staph bacteremia several months ago, and with no obvious source had his pacemaker system removed and a temporary and permanent transvenous system placed.  He has been treated with 6 weeks of IV antibiotics, and now presents for insertion of a new dual-chamber pacemaker and removal of his old temporary or permanent transvenous pacemaker.  PROCEDURE IN DETAIL:  After informed consent was obtained, the patient was taken to diagnostic EP lab in a fasting state.  After usual preparation and draping, intravenous fentanyl and midazolam were given for sedation.  A 30 mL of lidocaine was infiltrated into the right infraclavicular region.  A 6-cm incision was carried out over this region.  Electrocautery was utilized to dissect down to the fascial plane.  Initial attempts to puncture the subclavian vein were unsuccessful.  Venography of the subclavian vein was then carried out and despite this, we could not puncture the subclavian vein.  There was a very significantly sized cephalic vein, and while it was very deep, it was ultimately dissected out, and the Medtronic model 5076, 58 cm active fixation pacing lead serial# [id number] was advanced into the right ventricle and the Medtronic model 5076, 52 cm active fixation pacing lead  serial# [id number] was advanced to the right atrium.  Mapping was first carried out in the right ventricle.  At the final site, the R- waves (paced were 13 mV) and the pacing impedance with lead actively fixed was 600 ohms, threshold 0.8 V at 0.5 milliseconds.  A 10 V pacing did not stimulate the diaphragm.  With the right ventricular lead in satisfactory position, attention was then turned to the atrial lead was placed in anterolateral portion at right atrium where P-waves measured 3 mV.  The pacing impedance was 400 ohms and the threshold was 1.2 V at 0.5 milliseconds.  With these satisfactory parameters, the leads were secured to the subpectoral fascia with a figure-of-eight silk suture. The sewing sleeve was secured with silk suture.  Electrocautery was utilized to make subcutaneous pocket.  Antibiotic irrigation was utilized to irrigate the pocket.  Electrocautery was utilized to assure hemostasis.  The Medtronic adapter L dual-chamber pacemaker serial# [id number] was connected to the atrial and RV leads and placed back in the subcutaneous pocket.  The pocket was irrigated with additional antibiotic irrigation and the incision was closed with 2-0 and 3-0 Vicryl.  Benzoin and Steri-Strips were painted on the skin.  A pressure dressing was applied.  The patient was returned to his room in satisfactory condition.  COMPLICATIONS:  There were no  immediate procedure complications.  RESULTS:  This demonstrates successful insertion of a Medtronic dual- chamber pacemaker.  At the end of the procedure, the patient's temporary and permanent transvenous pacemaker was removed under fluoroscopic guidance without complication.     Doylene Canning. Ladona Ridgel, MD     GWT/MEDQ  D:  12/29/2012  T:  12/30/2012  Job:  [id number]  cc:   Hillis Range, MD

## 2012-12-30 NOTE — Discharge Summary (Signed)
ELECTROPHYSIOLOGY PROCEDURE DISCHARGE SUMMARY    Patient ID: [name],  MRN: [id number], DOB/AGE: 1932-11-18 79 y.o.  Admit date: 12/29/2012 Discharge date: 12/30/2012  Primary Care Physician: Sandrea Hughs, MD Primary Electrophysiologist: Hillis Range, MD  Primary Discharge Diagnosis:  Complete heart block with prior staph bacteremia status post dual chamber pacemaker placement this admission with removal of previously implanted temporary pacemaker  Secondary Discharge Diagnosis:  1.  Diabetes 2.  Hyperlipidemia 3.  Paroxysmal atrial fibrillation 4.  Follicular lymphoma - dx 2007 - in remission 5.  Pancytopenia  No Known Allergies   Procedures This Admission: 1.  Implantation of a dual chamber pacemaker on 12-29-2012 by Dr Ladona Ridgel.  The patient received a MDT Adapta L pacemaker with model number 5076 right atrial and right ventricular leads.  There were no early apparent complications. 2.  CXR on 12-30-2012 demonstrated stable leads and no pneumothorax  Brief HPI: [name] is a 79 year old male with a history of complete heart block status post pacemaker implantation in 2010.  In October of 2014 he was found to have staph bacteremia and underwent pacemaker system extraction with insertion of a temporary pacemaker.  He completed his antibiotics as directed by ID and resolved his bacteremia.  Risks, benefits, and alternatives to pacemaker implantation and removal of temporary pacemaker were reviewed with the patient who wished to proceed.    Hospital Course:  The patient was admitted and underwent implantation of a dual chamber Medtronic pacemaker with details as outlined above.   He was monitored on telemetry overnight which demonstrated AV pacing.  Left chest was without hematoma or ecchymosis.  The device was interrogated and found to be functioning normally.  CXR was obtained and demonstrated no pneumothorax status post device implantation.  Wound care, arm mobility, and  restrictions were reviewed with the patient.  Dr Johney Frame examined the patient and considered them stable for discharge to home.    Discharge Vitals: Blood pressure 116/54, pulse 70, temperature 98 F (36.7 C), temperature source Oral, resp. rate 18, height 5\' 11"  (1.803 m), weight 227 lb (102.967 kg), SpO2 97.00%.   Labs:   Lab Results  Component Value Date   WBC 1.7* 12/29/2012   HGB 10.7* 12/29/2012   HCT 30.6* 12/29/2012   MCV 101.7* 12/29/2012   PLT 130* 12/29/2012     Recent Labs Lab 12/29/12 0949  NA 135  K 3.8  CL 98  CO2 27  BUN 16  CREATININE 0.72  CALCIUM 9.6  GLUCOSE 126*   Physical Exam: Filed Vitals:   12/29/12 2000 12/29/12 2028 12/29/12 2200 12/30/12 0315  BP: 111/62  103/63 116/54  Pulse: 72  72 70  Temp: 98 F (36.7 C)   98 F (36.7 C)  TempSrc: Oral     Resp: 18   18  Height:      Weight:      SpO2: 95% 95%  97%    GEN- The patient is well appearing, alert and oriented x 3 today.   Head- normocephalic, atraumatic Eyes-  Sclera clear, conjunctiva pink Ears- hearing intact Oropharynx- clear Neck- supple,  Lungs- Clear to ausculation bilaterally, normal work of breathing Heart- Regular rate and rhythm  GI- soft, NT, ND, + BS Extremities- no clubbing, cyanosis, or edema MS- no significant deformity or atrophy Skin- pacemaker site is without hematoma  Discharge Medications:    Medication List         albuterol 108 (90 BASE) MCG/ACT inhaler  Commonly known as:  PROVENTIL HFA;VENTOLIN HFA  Inhale 2 puffs into the lungs every 4 (four) hours as needed for wheezing or shortness of breath.     aspirin 81 MG tablet  Take 81 mg by mouth at bedtime. (hold if bleeding)     cetirizine 10 MG tablet  Commonly known as:  ZYRTEC  Take 10 mg by mouth at bedtime as needed for allergies.     CITRUCEL PO  Take 1 tablet by mouth daily.     famotidine 20 MG tablet  Commonly known as:  PEPCID  Take 20 mg by mouth at bedtime.     finasteride 5 MG  tablet  Commonly known as:  PROSCAR  Take 5 mg by mouth every morning.     furosemide 40 MG tablet  Commonly known as:  LASIX  Take 1 tablet (40 mg total) by mouth daily. May take 1 extra daily if needed for swelling in legs     hydrocortisone cream 1 %  Apply 1 application topically 2 (two) times daily as needed (itchy rash).     levothyroxine 25 MCG tablet  Commonly known as:  SYNTHROID, LEVOTHROID  Take 1 tablet (25 mcg total) by mouth daily before breakfast.     metFORMIN 500 MG tablet  Commonly known as:  GLUCOPHAGE  Take 500 mg by mouth 2 (two) times daily with a meal.     MIRALAX powder  Generic drug:  polyethylene glycol powder  Take 17 g by mouth at bedtime.     mometasone 50 MCG/ACT nasal spray  Commonly known as:  NASONEX  Place 2 sprays into the nose 2 (two) times daily.     mometasone-formoterol 200-5 MCG/ACT Aero  Commonly known as:  DULERA  Inhale 2 puffs into the lungs 2 (two) times daily.     MUCINEX DM 30-600 MG per 12 hr tablet  Generic drug:  dextromethorphan-guaiFENesin  Take 1 tablet by mouth every 12 (twelve) hours as needed (cough).     multivitamin tablet  Take 1 tablet by mouth daily.     omeprazole 20 MG capsule  Commonly known as:  PRILOSEC  Take 40 mg by mouth daily before breakfast.     OS-CAL 500 + D 500-200 MG-UNIT per tablet  Generic drug:  calcium-vitamin D  Take 1 tablet by mouth 2 (two) times daily.     oxybutynin 5 MG tablet  Commonly known as:  DITROPAN  Take 10 mg by mouth every morning.     potassium chloride SA 20 MEQ tablet  Commonly known as:  K-DUR,KLOR-CON  Take 20 mEq by mouth daily.     traMADol 50 MG tablet  Commonly known as:  ULTRAM  Take 50-100 mg by mouth every 6 (six) hours as needed for pain (cough).     TYLENOL 500 MG tablet  Generic drug:  acetaminophen  Take 500-1,000 mg by mouth every 6 (six) hours as needed for mild pain or headache.     Vitamin D 1000 UNITS capsule  Take 1,000 Units by mouth 2  (two) times daily.     zolpidem 5 MG tablet  Commonly known as:  AMBIEN  Take 1 tablet (5 mg total) by mouth at bedtime as needed for sleep.        Disposition:   Future Appointments Provider Department Dept Phone   01/02/2013 2:30 PM Julio Sicks, NP [facility] [phone]   01/05/2013 1:45 PM Nyoka Cowden, MD [facility] [phone]  01/05/2013 2:45 PM Dava Najjar [facility] [phone]   01/05/2013 3:15 PM [facility] [place] [facility] [phone]   01/09/2013 12:00 PM [facility] Office [phone]   01/27/2013 1:30 PM [facility] MEDICAL ONCOLOGY [phone]   01/27/2013 2:00 PM [facility] [place] [facility] [phone]   02/16/2013 2:30 PM Beverely Pace [facility] [place] [facility] [phone]   02/16/2013 3:00 PM Benjiman Core, MD [facility] MEDICAL ONCOLOGY [phone]   02/16/2013 3:30 PM [facility] [place] [facility] [phone]   04/06/2013 10:15 AM Hillis Range, MD [facility] Office [phone]     Follow-up Information   Follow up with [facility] Office On 01/09/2013. (At 12:00 noon for wound check)    Specialty:  Cardiology   Contact information:   [address], [address] [place] [address] [phone]      Follow up with Hillis Range, MD On 04/06/2013. (At 10:15 AM)    Specialty:  Cardiology   Contact information:   [address] [place] [address] [phone]       Duration of Discharge Encounter: Greater than 30 minutes including physician time.  Signed, Hillis Range MD

## 2013-01-02 ENCOUNTER — Encounter: Payer: Self-pay | Admitting: Adult Health

## 2013-01-02 ENCOUNTER — Ambulatory Visit (INDEPENDENT_AMBULATORY_CARE_PROVIDER_SITE_OTHER): Payer: Medicare Other | Admitting: Adult Health

## 2013-01-02 VITALS — BP 124/68 | HR 72 | Temp 97.2°F | Ht 71.0 in | Wt 235.8 lb

## 2013-01-02 DIAGNOSIS — D61818 Other pancytopenia: Secondary | ICD-10-CM

## 2013-01-02 DIAGNOSIS — Z5189 Encounter for other specified aftercare: Secondary | ICD-10-CM

## 2013-01-02 DIAGNOSIS — T827XXD Infection and inflammatory reaction due to other cardiac and vascular devices, implants and grafts, subsequent encounter: Secondary | ICD-10-CM

## 2013-01-02 DIAGNOSIS — R413 Other amnesia: Secondary | ICD-10-CM

## 2013-01-02 DIAGNOSIS — E039 Hypothyroidism, unspecified: Secondary | ICD-10-CM

## 2013-01-02 NOTE — Patient Instructions (Signed)
Continue on current regimen.  Has a TSH done later this week when you have labs done at oncology.  Follow up Dr. [name]  In 3-4 weeks and As needed   Please contact office for sooner follow up if symptoms do not improve or worsen or seek emergency care

## 2013-01-03 ENCOUNTER — Telehealth: Payer: Self-pay | Admitting: Adult Health

## 2013-01-03 DIAGNOSIS — E039 Hypothyroidism, unspecified: Secondary | ICD-10-CM

## 2013-01-03 MED ORDER — LEVOTHYROXINE SODIUM 25 MCG PO TABS: 25.0000 ug | ORAL_TABLET | Freq: Every day | ORAL | Status: DC

## 2013-01-03 NOTE — Assessment & Plan Note (Signed)
Recently admitted 12/06/2012 -12/11/12 then readmitted 12/4-5 . Admitted with  MSSA bacteremia secondary to infected pacemaker-explanted . Tx w/ prolonged abx with ID consult  Improved  S/p explanted pacemaker and reimplatation  follow up with cards as planned

## 2013-01-03 NOTE — Progress Notes (Signed)
Subjective:     Patient ID: [name], male   DOB: Mar 06, 1932    MRN: [id number]  Brief patient profile:  22 yowm quit smoking 1970 with morbid obesity with boderline DM, Lymphoma s/p chemo ([facility]) and tendency to peripheral edema that is felt to be dependent and related to obesity/ venous insuff.   History of Present Illness  09/05/2012 acute  ov/Wert new problem = rash, breathing and cough resolved Chief Complaint  Patient presents with  . Acute Visit    Pt c/o rash and burning sensation left shoulder radiating down left arm- started to notice about a wk ago  initially though to be shingles but rash is actually bilateral upper arms and abd wall, itching rather than painful, no assoc fever or ha, arthralgias  >>refer to derm , vistaril rx   09/19/2012 Follow up  Returns for follow up for memory issues.  More forgetful. Family noticing trouble with memory as well.  MMSE today was 27/30, had difficulty with answers.  Clock draw was okay but took him a long time.  Had real trouble writing a sentence-he finally gave up.  No FH of Dementia per pt.  No headache, syncope, seizure act, visual/speech changes.  rec No change rx Neuro referral     09/30/2012 acute  ov/Wert re "abd pain" Chief Complaint  Patient presents with  . Acute Visit    Pt c/o low abd pain and right side pain x 2 days. He also states urine has been dark in color for the past 3 days.   has not taken any pain med, no rigors, no n or v, pain worse across both sides of back when bends over. Remembers "missing a step" prior to onset of back pain which forms a c around upper abd but is equal on both sides. No sob.  No change chronic cough or worse pain with cough rec As per calendar, take the tramadol 50 mg up to every 4 hours as needed for pain   Admit date: 10/27/2012  Discharge date: 11/08/2012  Primary Care Physician: Christinia Gully, MD  Final Discharge Diagnoses:  Staph aureus bacteremia/sepsis of unclear  etiology, likely with assumption that the pacemaker is contaminated  Secondary diagnosis . acute encephalopathy . Abdominal pain . Bacteremia due to Staphylococcus aureus . generalized debility  . Other pancytopenia . Hyponatremia . LYMPHOMA NEC, MLIG, INGUINAL/LOWER LIMB . Essential hypertension, benign . GERD . DM (diabetes mellitus), secondary uncontrolled    10/30 /2014 post hosp f/u ov/Wert re: asthma/ dm/ sp staph bacteremia Chief Complaint  Patient presents with  . Follow-up    Pt states recently d/ced from [facility] on 11/08/12- feeling fatigued since then.    In the process of completing 4 weeks IV abx with removal of permanent pacemaker f/u per Dr Lovena Le Not limited by breathing, rarely needing saba, no sign  cough. Fasting sugars in the 120-140 range. >>no changes  01/02/13 [facility] follow up  Admitted originally 12/06/2012 -12/11/12 then readmitted 12/4-5 . Admitted with  MSSA bacteremia secondary to infected pacemaker-explanted . Tx w/ prolonged abx with ID consult . Stay complicated by  Pancytopenia had 2 units PRBC transfusion . - Follows with Dr. [name] of the oncology service.  Had volume overload.  Improved with aggressive diuresis.  . The patient lost over 10 kg and 13L during his hospitalization. The patient's lower extremity edema improved. The patient will be discharged home to finish his cefazolin with his last dose on 12/16/2012. After initial  improvement of the patient's sodium, his sodium trended back down. Nephrology was consulted. They agreed with current management. In light of the patient's worsened serum sodium, they recommended starting Synthroid based upon the patient's thyroid function studies. The patient will be sent home with Synthroid 25 mcg daily.   Says  doing "great" since discharge.  no new complaints.  does have medication questions concerning Lexapro replacement, Trazodone vs Ambien, Levothyroxine  Pt needs tsh check now on  levothyroxine. Lexapro was stopped due to low sodium. Denies depression flare.  Can not take ambien. Does take 1/3 of trazodone.  meds reviewed with updated list.    Current Medications, Allergies, Past Medical History, Past Surgical History, Family History, and Social History were reviewed in [employer] record.  ROS  The following are not active complaints unless bolded sore throat, dysphagia, dental problems, itching, sneezing,  nasal congestion or excess/ purulent secretions, ear ache,   fever, chills, sweats, unintended wt loss, pleuritic or exertional cp, hemoptysis,  orthopnea pnd or leg swelling, presyncope, palpitations, heartburn, abdominal pain, anorexia, nausea, vomiting, diarrhea  or change in bowel or urinary habits, change in stools or urine, dysuria,hematuria,  rash, arthralgias, visual complaints, headache, numbness weakness or ataxia or problems with walking or coordination,  change in mood/affect or memory.                 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   [facility]  - MRI 11/23/2007 c/w L1-2 bulging disc indents the thecal sac with foraminal stenosis  HEALTH MAINTENANCE..........................................................................................[facility]  - Td 10/2005  - Pneumovax 10/07 second shot  - CPX 08/20/2010   --colon 02/2005 -int/extern. hems (repeat q7y)  Complex med regimen  --Meds reviewed with pt education and computerized med calendar completed/adjusted. November 08, 2008 , August 21, 2009 updated 02/19/2011 , 10/23/2011  Syncope...........................................................................................................[facility] KitchenHochrein  - Mobitz II AV block s/p Medtronic pacemaker placement 12/11/2008  [facility].....................................................................................................[facility]  - Pruritic rash 04/2009 > tried lotrisone, referred to [facility] August 06, 2009  Memory impairment , MMSE 27/30 8/25>refer to neuro   Family History:   mother had diabetes   Social History:  Patient states former smoker.  quit smoking in 1970  No ETOH  Retired            Objective:   Physical Exam wt 244 January 16, 2008 > 249 April 24, 2009 > 249 10/06/2010 >  11/27/2010  245 > 01/07/2011 241 > 02/19/11 238 > 06/04/2011  247 >07/03/2011 246 > 07/31/2011  248 > 09/11/2011  247 >246 10/23/2011 > 258 02/15/2012 > 05/19/2012  258 > 08/19/2012 255 >  266 09/05/12>  11/24/12 409>811 01/02/13   Ambulatory obese wm in no acute distress but seems overall much more frail  HEENT: nl dentition, turbinates, and orophanx.  Mucus membranes pink and moist. No tongue or throat exudate.  Neck: supple with full ROM. no JVD, node enlargement or TMJ   Lungs: clear and equal bilateral breath sounds. No wheezing, rhonchi  or rales  Chest: RRR. No murmurs, rubs, or gallops.  Tr- peripheral edema. Pacer site on Right upper chest wall clean/dry dressing , no redness noted.  Abd: obese but soft and no rebound or focal tenderness. Normal excursion in the supine position.  Ext warm and dry, no calf tenderness, cyanosis or clubbing. mild/mod chronic venous insufficiency changes. Varicose veins present. Neuro: nl sensorium and gait..  Skin:   No rash      Assessment:

## 2013-01-03 NOTE — Telephone Encounter (Signed)
Dr Alver Fisher nurse returned call Order/requisition can be faxed to [phone] -- needs pt's name, dob, diagnosis and fax # for results (triage fax, to my attention)  Done Will sign off.

## 2013-01-03 NOTE — Telephone Encounter (Signed)
Per 12.8.14 ov w/ TP, pt to have TSH w/ labs at [facility] on Friday 12.12.14 LMOVM for Dr Alver Fisher nurse to discuss - will need fax number to send order/requisition Will route back to my inbox

## 2013-01-03 NOTE — Assessment & Plan Note (Signed)
Recheck cbc  follow up with oncology as planned and As needed

## 2013-01-03 NOTE — Assessment & Plan Note (Signed)
Recheck tsh  Cont on synthroid

## 2013-01-05 ENCOUNTER — Other Ambulatory Visit: Payer: Self-pay | Admitting: *Deleted

## 2013-01-05 ENCOUNTER — Ambulatory Visit: Payer: Medicare Other | Admitting: Internal Medicine

## 2013-01-05 ENCOUNTER — Ambulatory Visit (HOSPITAL_BASED_OUTPATIENT_CLINIC_OR_DEPARTMENT_OTHER): Payer: Medicare Other

## 2013-01-05 ENCOUNTER — Other Ambulatory Visit (HOSPITAL_BASED_OUTPATIENT_CLINIC_OR_DEPARTMENT_OTHER): Payer: Medicare Other

## 2013-01-05 VITALS — BP 129/74 | HR 99 | Temp 97.7°F

## 2013-01-05 DIAGNOSIS — D469 Myelodysplastic syndrome, unspecified: Secondary | ICD-10-CM

## 2013-01-05 DIAGNOSIS — D649 Anemia, unspecified: Secondary | ICD-10-CM

## 2013-01-05 DIAGNOSIS — D61818 Other pancytopenia: Secondary | ICD-10-CM

## 2013-01-05 DIAGNOSIS — E871 Hypo-osmolality and hyponatremia: Secondary | ICD-10-CM

## 2013-01-05 LAB — CBC WITH DIFFERENTIAL/PLATELET
BASO%: 0.6 % (ref 0.0–2.0)
Basophils Absolute: 0 10*3/uL (ref 0.0–0.1)
EOS%: 1.5 % (ref 0.0–7.0)
Eosinophils Absolute: 0 10*3/uL (ref 0.0–0.5)
HGB: 10.3 g/dL — ABNORMAL LOW (ref 13.0–17.1)
MCH: 36.5 pg — ABNORMAL HIGH (ref 27.2–33.4)
MCHC: 34.8 g/dL (ref 32.0–36.0)
MCV: 104.7 fL — ABNORMAL HIGH (ref 79.3–98.0)
MONO%: 1.4 % (ref 0.0–14.0)
NEUT#: 1.1 10*3/uL — ABNORMAL LOW (ref 1.5–6.5)
RDW: 26.2 % — ABNORMAL HIGH (ref 11.0–14.6)
lymph#: 0.7 10*3/uL — ABNORMAL LOW (ref 0.9–3.3)

## 2013-01-05 LAB — BASIC METABOLIC PANEL (CC13)
Anion Gap: 11 mEq/L (ref 3–11)
BUN: 13.9 mg/dL (ref 7.0–26.0)
CO2: 26 mEq/L (ref 22–29)
Calcium: 10.1 mg/dL (ref 8.4–10.4)
Creatinine: 0.8 mg/dL (ref 0.7–1.3)
Glucose: 144 mg/dl — ABNORMAL HIGH (ref 70–140)
Sodium: 137 mEq/L (ref 136–145)

## 2013-01-05 MED ORDER — DARBEPOETIN ALFA-POLYSORBATE 300 MCG/0.6ML IJ SOLN
300.0000 ug | Freq: Once | INTRAMUSCULAR | Status: AC
  Administered 2013-01-05: 300 ug via SUBCUTANEOUS
  Filled 2013-01-05: qty 0.6

## 2013-01-09 ENCOUNTER — Ambulatory Visit (INDEPENDENT_AMBULATORY_CARE_PROVIDER_SITE_OTHER): Payer: Medicare Other | Admitting: *Deleted

## 2013-01-09 ENCOUNTER — Encounter: Payer: Self-pay | Admitting: Internal Medicine

## 2013-01-09 DIAGNOSIS — I4891 Unspecified atrial fibrillation: Secondary | ICD-10-CM

## 2013-01-09 DIAGNOSIS — I442 Atrioventricular block, complete: Secondary | ICD-10-CM

## 2013-01-10 ENCOUNTER — Encounter: Payer: Self-pay | Admitting: Infectious Disease

## 2013-01-10 ENCOUNTER — Telehealth: Payer: Self-pay | Admitting: Internal Medicine

## 2013-01-10 LAB — MDC_IDC_ENUM_SESS_TYPE_INCLINIC
Battery Impedance: 100 Ohm
Battery Remaining Longevity: 80 mo
Brady Statistic AP VP Percent: 1 %
Brady Statistic AS VP Percent: 98 %
Date Time Interrogation Session: 20141215172139
Lead Channel Impedance Value: 501 Ohm
Lead Channel Pacing Threshold Pulse Width: 0.4 ms
Lead Channel Sensing Intrinsic Amplitude: 2.8 mV
Lead Channel Setting Pacing Amplitude: 3.5 V
Lead Channel Setting Pacing Amplitude: 3.5 V
Lead Channel Setting Sensing Sensitivity: 4 mV

## 2013-01-10 MED ORDER — FUROSEMIDE 40 MG PO TABS: 40.0000 mg | ORAL_TABLET | Freq: Every day | ORAL | Status: DC

## 2013-01-10 NOTE — Telephone Encounter (Signed)
Are you okay with filling this? Saw TP last week for HFU.  Thanks.

## 2013-01-10 NOTE — Telephone Encounter (Signed)
Rx has been sent in. Pt's daughter is aware. 

## 2013-01-10 NOTE — Telephone Encounter (Signed)
ok 

## 2013-01-10 NOTE — Progress Notes (Signed)
Wound check appointment. Steri-strips removed. Wound without redness or edema. Incision edges approximated, wound well healed. Normal device function. Thresholds, sensing, and impedances consistent with implant measurements. Device programmed at 3.5V/auto capture programmed on for extra safety margin until 3 month visit. Histogram distribution appropriate for patient and level of activity. 10 mode switches--longest was 57 seconds. No high ventricular rates noted. Patient educated about wound care, arm mobility, lifting restrictions. ROV 04-06-13 @ 1015 with JA.

## 2013-01-11 ENCOUNTER — Telehealth: Payer: Self-pay | Admitting: Adult Health

## 2013-01-11 NOTE — Telephone Encounter (Signed)
Per 12.8.14 ov w/ TP:  Patient Instructions     Continue on current regimen.  Has a TSH done later this week when you have labs done at oncology.  Follow up Dr. [name] In 3-4 weeks and As needed  Please contact office for sooner follow up if symptoms do not improve or worsen or seek emergency care    TSH results received from [facility] >> 5.577 Per TP: TSH is improving on Thryoid rx.  Continue same recs from ov and follow up with MW 4 weeks from last ov  Pt has upcoming appt w/ MW on 12.29.14 LMOM TCB x1 for patient

## 2013-01-12 ENCOUNTER — Telehealth: Payer: Self-pay | Admitting: Internal Medicine

## 2013-01-12 ENCOUNTER — Telehealth: Payer: Self-pay | Admitting: Oncology

## 2013-01-12 NOTE — Telephone Encounter (Signed)
Pt is awre of recs. Nothing further needed

## 2013-01-12 NOTE — Telephone Encounter (Signed)
lvm for pt regarding to changing appt d/t...mailed pt appt sched and avs with letter.

## 2013-01-12 NOTE — Telephone Encounter (Signed)
See phone note 01/11/13

## 2013-01-13 DIAGNOSIS — B958 Unspecified staphylococcus as the cause of diseases classified elsewhere: Secondary | ICD-10-CM

## 2013-01-13 DIAGNOSIS — E119 Type 2 diabetes mellitus without complications: Secondary | ICD-10-CM

## 2013-01-13 DIAGNOSIS — T827XXA Infection and inflammatory reaction due to other cardiac and vascular devices, implants and grafts, initial encounter: Secondary | ICD-10-CM

## 2013-01-13 DIAGNOSIS — H519 Unspecified disorder of binocular movement: Secondary | ICD-10-CM

## 2013-01-13 DIAGNOSIS — I4891 Unspecified atrial fibrillation: Secondary | ICD-10-CM

## 2013-01-18 ENCOUNTER — Inpatient Hospital Stay (HOSPITAL_COMMUNITY)
Admission: EM | Admit: 2013-01-18 | Discharge: 2013-01-20 | DRG: 151 | Disposition: A | Discharge status: Home / Self Care | Payer: Medicare Other | Attending: Internal Medicine | Admitting: Internal Medicine

## 2013-01-18 ENCOUNTER — Encounter (HOSPITAL_COMMUNITY): Payer: Self-pay | Admitting: Emergency Medicine

## 2013-01-18 DIAGNOSIS — R531 Weakness: Secondary | ICD-10-CM

## 2013-01-18 DIAGNOSIS — D61818 Other pancytopenia: Secondary | ICD-10-CM

## 2013-01-18 DIAGNOSIS — K469 Unspecified abdominal hernia without obstruction or gangrene: Secondary | ICD-10-CM

## 2013-01-18 DIAGNOSIS — B9561 Methicillin susceptible Staphylococcus aureus infection as the cause of diseases classified elsewhere: Secondary | ICD-10-CM

## 2013-01-18 DIAGNOSIS — R509 Fever, unspecified: Secondary | ICD-10-CM

## 2013-01-18 DIAGNOSIS — R0789 Other chest pain: Secondary | ICD-10-CM

## 2013-01-18 DIAGNOSIS — E669 Obesity, unspecified: Secondary | ICD-10-CM

## 2013-01-18 DIAGNOSIS — C8595 Non-Hodgkin lymphoma, unspecified, lymph nodes of inguinal region and lower limb: Secondary | ICD-10-CM

## 2013-01-18 DIAGNOSIS — R21 Rash and other nonspecific skin eruption: Secondary | ICD-10-CM

## 2013-01-18 DIAGNOSIS — Z950 Presence of cardiac pacemaker: Secondary | ICD-10-CM

## 2013-01-18 DIAGNOSIS — R609 Edema, unspecified: Secondary | ICD-10-CM

## 2013-01-18 DIAGNOSIS — R7881 Bacteremia: Secondary | ICD-10-CM

## 2013-01-18 DIAGNOSIS — Z7982 Long term (current) use of aspirin: Secondary | ICD-10-CM

## 2013-01-18 DIAGNOSIS — J45909 Unspecified asthma, uncomplicated: Secondary | ICD-10-CM

## 2013-01-18 DIAGNOSIS — J310 Chronic rhinitis: Secondary | ICD-10-CM

## 2013-01-18 DIAGNOSIS — Z800 Family history of malignant neoplasm of digestive organs: Secondary | ICD-10-CM

## 2013-01-18 DIAGNOSIS — D72819 Decreased white blood cell count, unspecified: Secondary | ICD-10-CM

## 2013-01-18 DIAGNOSIS — R109 Unspecified abdominal pain: Secondary | ICD-10-CM

## 2013-01-18 DIAGNOSIS — E039 Hypothyroidism, unspecified: Secondary | ICD-10-CM

## 2013-01-18 DIAGNOSIS — Z833 Family history of diabetes mellitus: Secondary | ICD-10-CM

## 2013-01-18 DIAGNOSIS — M549 Dorsalgia, unspecified: Secondary | ICD-10-CM

## 2013-01-18 DIAGNOSIS — I441 Atrioventricular block, second degree: Secondary | ICD-10-CM

## 2013-01-18 DIAGNOSIS — A419 Sepsis, unspecified organism: Secondary | ICD-10-CM

## 2013-01-18 DIAGNOSIS — E872 Acidosis, unspecified: Secondary | ICD-10-CM

## 2013-01-18 DIAGNOSIS — I10 Essential (primary) hypertension: Secondary | ICD-10-CM

## 2013-01-18 DIAGNOSIS — D62 Acute posthemorrhagic anemia: Secondary | ICD-10-CM

## 2013-01-18 DIAGNOSIS — G9340 Encephalopathy, unspecified: Secondary | ICD-10-CM

## 2013-01-18 DIAGNOSIS — D649 Anemia, unspecified: Secondary | ICD-10-CM

## 2013-01-18 DIAGNOSIS — N139 Obstructive and reflux uropathy, unspecified: Secondary | ICD-10-CM

## 2013-01-18 DIAGNOSIS — Z79899 Other long term (current) drug therapy: Secondary | ICD-10-CM

## 2013-01-18 DIAGNOSIS — E1365 Other specified diabetes mellitus with hyperglycemia: Secondary | ICD-10-CM

## 2013-01-18 DIAGNOSIS — I442 Atrioventricular block, complete: Secondary | ICD-10-CM

## 2013-01-18 DIAGNOSIS — IMO0002 Reserved for concepts with insufficient information to code with codable children: Secondary | ICD-10-CM

## 2013-01-18 DIAGNOSIS — E785 Hyperlipidemia, unspecified: Secondary | ICD-10-CM

## 2013-01-18 DIAGNOSIS — R059 Cough, unspecified: Secondary | ICD-10-CM

## 2013-01-18 DIAGNOSIS — R040 Epistaxis: Principal | ICD-10-CM

## 2013-01-18 DIAGNOSIS — C8299 Follicular lymphoma, unspecified, extranodal and solid organ sites: Secondary | ICD-10-CM | POA: Diagnosis present

## 2013-01-18 DIAGNOSIS — R05 Cough: Secondary | ICD-10-CM

## 2013-01-18 DIAGNOSIS — I4891 Unspecified atrial fibrillation: Secondary | ICD-10-CM

## 2013-01-18 DIAGNOSIS — E871 Hypo-osmolality and hyponatremia: Secondary | ICD-10-CM

## 2013-01-18 DIAGNOSIS — Z87891 Personal history of nicotine dependence: Secondary | ICD-10-CM

## 2013-01-18 DIAGNOSIS — T827XXD Infection and inflammatory reaction due to other cardiac and vascular devices, implants and grafts, subsequent encounter: Secondary | ICD-10-CM

## 2013-01-18 DIAGNOSIS — I509 Heart failure, unspecified: Secondary | ICD-10-CM | POA: Diagnosis present

## 2013-01-18 DIAGNOSIS — E559 Vitamin D deficiency, unspecified: Secondary | ICD-10-CM

## 2013-01-18 DIAGNOSIS — I872 Venous insufficiency (chronic) (peripheral): Secondary | ICD-10-CM

## 2013-01-18 DIAGNOSIS — K219 Gastro-esophageal reflux disease without esophagitis: Secondary | ICD-10-CM

## 2013-01-18 DIAGNOSIS — E119 Type 2 diabetes mellitus without complications: Secondary | ICD-10-CM | POA: Diagnosis present

## 2013-01-18 DIAGNOSIS — I5033 Acute on chronic diastolic (congestive) heart failure: Secondary | ICD-10-CM

## 2013-01-18 DIAGNOSIS — J309 Allergic rhinitis, unspecified: Secondary | ICD-10-CM | POA: Diagnosis present

## 2013-01-18 DIAGNOSIS — R0602 Shortness of breath: Secondary | ICD-10-CM

## 2013-01-18 DIAGNOSIS — R413 Other amnesia: Secondary | ICD-10-CM

## 2013-01-18 LAB — BASIC METABOLIC PANEL
BUN: 20 mg/dL (ref 6–23)
Calcium: 10.1 mg/dL (ref 8.4–10.5)
Creatinine, Ser: 0.74 mg/dL (ref 0.50–1.35)
GFR calc non Af Amer: 86 mL/min — ABNORMAL LOW (ref >90)
Glucose, Bld: 149 mg/dL — ABNORMAL HIGH (ref 70–99)
Potassium: 3.8 mEq/L (ref 3.5–5.1)
Sodium: 132 mEq/L — ABNORMAL LOW (ref 135–145)

## 2013-01-18 LAB — CBC WITH DIFFERENTIAL/PLATELET
Basophils Relative: 1 % (ref 0–1)
Eosinophils Absolute: 0 10*3/uL (ref 0.0–0.7)
Eosinophils Relative: 1 % (ref 0–5)
HCT: 23.2 % — ABNORMAL LOW (ref 39.0–52.0)
Hemoglobin: 8.4 g/dL — ABNORMAL LOW (ref 13.0–17.0)
Lymphocytes Relative: 32 % (ref 12–46)
Lymphs Abs: 0.6 10*3/uL — ABNORMAL LOW (ref 0.7–4.0)
MCH: 36.2 pg — ABNORMAL HIGH (ref 26.0–34.0)
MCHC: 36.2 g/dL — ABNORMAL HIGH (ref 30.0–36.0)
Monocytes Absolute: 0 10*3/uL — ABNORMAL LOW (ref 0.1–1.0)
Neutro Abs: 1.4 10*3/uL — ABNORMAL LOW (ref 1.7–7.7)
RBC: 2.32 MIL/uL — ABNORMAL LOW (ref 4.22–5.81)
RDW: 22.3 % — ABNORMAL HIGH (ref 11.5–15.5)

## 2013-01-18 MED ORDER — DM-GUAIFENESIN ER 30-600 MG PO TB12
1.0000 | ORAL_TABLET | Freq: Two times a day (BID) | ORAL | Status: DC | PRN
  Filled 2013-01-18: qty 1

## 2013-01-18 MED ORDER — MOMETASONE FURO-FORMOTEROL FUM 200-5 MCG/ACT IN AERO
2.0000 | INHALATION_SPRAY | Freq: Two times a day (BID) | RESPIRATORY_TRACT | Status: DC
  Administered 2013-01-18 – 2013-01-19 (×3): 2 via RESPIRATORY_TRACT
  Filled 2013-01-18: qty 8.8

## 2013-01-18 MED ORDER — PANTOPRAZOLE SODIUM 40 MG PO TBEC
40.0000 mg | DELAYED_RELEASE_TABLET | Freq: Every day | ORAL | Status: DC
  Administered 2013-01-20: 40 mg via ORAL
  Filled 2013-01-18 (×3): qty 1

## 2013-01-18 MED ORDER — ADULT MULTIVITAMIN W/MINERALS CH
1.0000 | ORAL_TABLET | Freq: Every day | ORAL | Status: DC
  Administered 2013-01-19 – 2013-01-20 (×2): 1 via ORAL
  Filled 2013-01-18 (×3): qty 1

## 2013-01-18 MED ORDER — TRAMADOL HCL 50 MG PO TABS
50.0000 mg | ORAL_TABLET | Freq: Four times a day (QID) | ORAL | Status: DC | PRN
  Administered 2013-01-19: 100 mg via ORAL
  Filled 2013-01-18: qty 2

## 2013-01-18 MED ORDER — SODIUM CHLORIDE 0.9 % IV SOLN
INTRAVENOUS | Status: DC
  Administered 2013-01-18 – 2013-01-19 (×2): via INTRAVENOUS
  Administered 2013-01-19: 125 mL/h via INTRAVENOUS
  Administered 2013-01-20: 08:00:00 via INTRAVENOUS

## 2013-01-18 MED ORDER — OXYBUTYNIN CHLORIDE ER 10 MG PO TB24
10.0000 mg | ORAL_TABLET | Freq: Every day | ORAL | Status: DC
  Administered 2013-01-19 (×2): 10 mg via ORAL
  Filled 2013-01-18 (×3): qty 1

## 2013-01-18 MED ORDER — METFORMIN HCL 500 MG PO TABS
500.0000 mg | ORAL_TABLET | Freq: Two times a day (BID) | ORAL | Status: DC
  Administered 2013-01-19 – 2013-01-20 (×3): 500 mg via ORAL
  Filled 2013-01-18 (×5): qty 1

## 2013-01-18 MED ORDER — ZOLPIDEM TARTRATE 5 MG PO TABS: 5.0000 mg | ORAL_TABLET | Freq: Every evening | ORAL | Status: DC | PRN

## 2013-01-18 MED ORDER — POLYETHYLENE GLYCOL 3350 17 G PO PACK
17.0000 g | PACK | Freq: Every day | ORAL | Status: DC
  Administered 2013-01-19 (×2): 17 g via ORAL
  Filled 2013-01-18 (×3): qty 1

## 2013-01-18 MED ORDER — CALCIUM CARBONATE-VITAMIN D 500-200 MG-UNIT PO TABS
1.0000 | ORAL_TABLET | Freq: Two times a day (BID) | ORAL | Status: DC
  Administered 2013-01-19 – 2013-01-20 (×4): 1 via ORAL
  Filled 2013-01-18 (×5): qty 1

## 2013-01-18 MED ORDER — ALBUTEROL SULFATE HFA 108 (90 BASE) MCG/ACT IN AERS: 2.0000 | INHALATION_SPRAY | RESPIRATORY_TRACT | Status: DC | PRN

## 2013-01-18 MED ORDER — VITAMIN D 1000 UNITS PO TABS
1000.0000 [IU] | ORAL_TABLET | Freq: Two times a day (BID) | ORAL | Status: DC
  Administered 2013-01-19 – 2013-01-20 (×4): 1000 [IU] via ORAL
  Filled 2013-01-18 (×5): qty 1

## 2013-01-18 MED ORDER — POLYETHYLENE GLYCOL 3350 17 GM/SCOOP PO POWD
17.0000 g | Freq: Every day | ORAL | Status: DC
  Filled 2013-01-18: qty 255

## 2013-01-18 MED ORDER — FINASTERIDE 5 MG PO TABS
5.0000 mg | ORAL_TABLET | Freq: Every morning | ORAL | Status: DC
  Administered 2013-01-19 – 2013-01-20 (×2): 5 mg via ORAL
  Filled 2013-01-18 (×2): qty 1

## 2013-01-18 MED ORDER — LORATADINE 10 MG PO TABS
10.0000 mg | ORAL_TABLET | Freq: Every day | ORAL | Status: DC
  Administered 2013-01-19 – 2013-01-20 (×2): 10 mg via ORAL
  Filled 2013-01-18 (×2): qty 1

## 2013-01-18 MED ORDER — LEVOTHYROXINE SODIUM 25 MCG PO TABS
25.0000 ug | ORAL_TABLET | Freq: Every day | ORAL | Status: DC
  Administered 2013-01-19 – 2013-01-20 (×2): 25 ug via ORAL
  Filled 2013-01-18 (×4): qty 1

## 2013-01-18 MED ORDER — ACETAMINOPHEN 500 MG PO TABS
500.0000 mg | ORAL_TABLET | Freq: Four times a day (QID) | ORAL | Status: DC | PRN
  Administered 2013-01-19 (×2): 1000 mg via ORAL
  Filled 2013-01-18 (×2): qty 2

## 2013-01-18 MED ORDER — FAMOTIDINE 20 MG PO TABS
20.0000 mg | ORAL_TABLET | Freq: Every day | ORAL | Status: DC
  Administered 2013-01-19 (×2): 20 mg via ORAL
  Filled 2013-01-18 (×3): qty 1

## 2013-01-18 MED ORDER — POTASSIUM CHLORIDE CRYS ER 20 MEQ PO TBCR: 20.0000 meq | EXTENDED_RELEASE_TABLET | Freq: Every day | ORAL | Status: DC

## 2013-01-18 MED ORDER — SODIUM CHLORIDE 0.9 % IJ SOLN: 3.0000 mL | Freq: Two times a day (BID) | INTRAMUSCULAR | Status: DC

## 2013-01-18 MED ORDER — ONDANSETRON 4 MG PO TBDP
4.0000 mg | ORAL_TABLET | Freq: Once | ORAL | Status: AC
  Administered 2013-01-18: 4 mg via ORAL
  Filled 2013-01-18: qty 1

## 2013-01-18 NOTE — H&P (Signed)
[facility] History and Physical  [name] ZOX:[id number] DOB: September 22, 1932 DOA: 01/18/2013  Referring physician: EDP PCP: Sandrea Hughs, MD   Chief Complaint: Epistaxis   HPI: [name] is a 79 y.o. male who presents to the ED with c/o epistaxis.  Symptoms onset 4 hrs PTA, spontaneously onset with no trauma.  No blood thinners at home, does have h/o pancytopenia.  Normally transfused when his HGB gets down to 8.  On CBC his HGB is down to 8.3 from 10 earlier this month.  Posterior packing performed by EDP, bleeding stopped.  Tachycardic in ED but not hypotensive.  Hospitalist asked to admit.  Review of Systems: Systems reviewed.  As above, otherwise negative  Past Medical History  Diagnosis Date  . Allergic rhinitis   . Hyperlipidemia   . Exogenous obesity   . Edema     venous insufficiency  . Second degree Mobitz II AV block 12/11/08    with transient syncope, resolved s/p PPM  . Paroxysmal atrial fibrillation 03/16/11    diagnosed by PPM interrogation  . Pancytopenia   . Postural dizziness   . CHF (congestive heart failure)   . Pacemaker   . Dyspnea     "all the time w/the temporary pacer" (12/29/2012)  . Type II diabetes mellitus 2012  . History of blood transfusion     "3 recently" (12/29/2012)  . Daily headache     "recently w/the ATB" (12/29/2012)  . Arthritis     "hips; knees" (12/29/2012)  . 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    Past Surgical History  Procedure Laterality Date  . Lymph node biopsy  04/2005    groin  . Sternotomy  2000  . Pericardiectomy  2000  . Penile prosthesis implant      and removal  . Pacemaker insertion  12/11/08    MDT implanted by Dr Johney Frame  . Tee without cardioversion N/A 10/31/2012    Procedure: TRANSESOPHAGEAL ECHOCARDIOGRAM (TEE);  Surgeon: Lewayne Bunting, MD;  Location: [facility] ENDOSCOPY;  Service: Cardiovascular;  Laterality: N/A;  . Pacemaker lead removal  Left 11/04/2012    Procedure: PACEMAKER LEAD REMOVAL;  Surgeon: Marinus Maw, MD;  Location: [facility] OR;  Service: Cardiovascular;  Laterality: Left;  . Temporary pacemaker insertion Left 11/04/2012    Procedure: TEMPORARY PACEMAKER INSERTION;  Surgeon: Marinus Maw, MD;  Location: [facility] OR;  Service: Cardiovascular;  Laterality: Left;  . Insert / replace / remove pacemaker  12/29/2012   Social History:  reports that he quit smoking about 45 years ago. His smoking use included Cigarettes. He has a 30 pack-year smoking history. He has quit using smokeless tobacco. His smokeless tobacco use included Chew. He reports that he does not drink alcohol or use illicit drugs.  No Known Allergies  Family History  Problem Relation Age of Onset  . Diabetes Mother   . Liver cancer Brother   . Cancer Sister      Prior to Admission medications   Medication Sig Start Date End Date Taking? Authorizing Provider  acetaminophen (TYLENOL) 500 MG tablet Take 500-1,000 mg by mouth every 6 (six) hours as needed for mild pain or headache.     Historical Provider, MD  albuterol (PROVENTIL HFA;VENTOLIN HFA) 108 (90 BASE) MCG/ACT inhaler Inhale 2 puffs into the lungs every 4 (four) hours as needed for wheezing or shortness of breath.     Historical Provider, MD  aspirin  81 MG tablet Take 81 mg by mouth at bedtime. (hold if bleeding)    Historical Provider, MD  calcium-vitamin D (OS-CAL 500 + D) 500-200 MG-UNIT per tablet Take 1 tablet by mouth 2 (two) times daily.     Historical Provider, MD  cetirizine (ZYRTEC) 10 MG tablet Take 10 mg by mouth at bedtime as needed for allergies.    Historical Provider, MD  Cholecalciferol (VITAMIN D) 1000 UNITS capsule Take 1,000 Units by mouth 2 (two) times daily.      Historical Provider, MD  dextromethorphan-guaiFENesin (MUCINEX DM) 30-600 MG per 12 hr tablet Take 1 tablet by mouth every 12 (twelve) hours as needed (cough).  06/26/10   Tammy S Parrett, NP  famotidine (PEPCID) 20 MG  tablet Take 20 mg by mouth at bedtime.     Historical Provider, MD  finasteride (PROSCAR) 5 MG tablet Take 5 mg by mouth every morning.     Historical Provider, MD  furosemide (LASIX) 40 MG tablet Take 1 tablet (40 mg total) by mouth daily. May take 1 extra daily if needed for swelling in legs 01/10/13   Nyoka Cowden, MD  hydrocortisone cream 1 % Apply 1 application topically 2 (two) times daily as needed (itchy rash).     Historical Provider, MD  levothyroxine (SYNTHROID, LEVOTHROID) 25 MCG tablet Take 1 tablet (25 mcg total) by mouth daily before breakfast. 01/03/13   Tammy S Parrett, NP  metFORMIN (GLUCOPHAGE) 500 MG tablet Take 500 mg by mouth 2 (two) times daily with a meal.  06/23/11   Tammy S Parrett, NP  mometasone (NASONEX) 50 MCG/ACT nasal spray Place 2 sprays into the nose 2 (two) times daily.  02/23/11   Tammy S Parrett, NP  mometasone-formoterol (DULERA) 200-5 MCG/ACT AERO Inhale 2 puffs into the lungs 2 (two) times daily.    Historical Provider, MD  Multiple Vitamin (MULTIVITAMIN) tablet Take 1 tablet by mouth daily.      Historical Provider, MD  omeprazole (PRILOSEC) 20 MG capsule Take 40 mg by mouth daily before breakfast.     Historical Provider, MD  polyethylene glycol powder (MIRALAX) powder Take 17 g by mouth at bedtime.     Historical Provider, MD  potassium chloride SA (K-DUR,KLOR-CON) 20 MEQ tablet Take 20 mEq by mouth daily.    Historical Provider, MD  traMADol (ULTRAM) 50 MG tablet Take 50-100 mg by mouth every 6 (six) hours as needed for pain (cough).    Historical Provider, MD  zolpidem (AMBIEN) 5 MG tablet Take 1 tablet (5 mg total) by mouth at bedtime as needed for sleep. 12/12/12   Catarina Hartshorn, MD   Physical Exam: Filed Vitals:   01/18/13 2234  BP: 113/69  Pulse: 108  Temp:   Resp: 20    BP 113/69  Pulse 108  Temp(Src) 97.9 F (36.6 C) (Oral)  Resp 20  SpO2 95%  General Appearance:    Alert, oriented, no distress, appears stated age  Head:    Normocephalic,  atraumatic  Eyes:    PERRL, EOMI, sclera non-icteric        Nose:   Nares without drainage or epistaxis. Mucosa, turbinates normal  Throat:   Moist mucous membranes. Oropharynx without erythema or exudate.  Neck:   Supple. No carotid bruits.  No thyromegaly.  No lymphadenopathy.   Back:     No CVA tenderness, no spinal tenderness  Lungs:     Clear to auscultation bilaterally, without wheezes, rhonchi or rales  Chest wall:  No tenderness to palpitation  Heart:    Regular rate and rhythm without murmurs, gallops, rubs  Abdomen:     Soft, non-tender, nondistended, normal bowel sounds, no organomegaly  Genitalia:    deferred  Rectal:    deferred  Extremities:   No clubbing, cyanosis or edema.  Pulses:   2+ and symmetric all extremities  Skin:   Skin color, texture, turgor normal, no rashes or lesions  Lymph nodes:   Cervical, supraclavicular, and axillary nodes normal  Neurologic:   CNII-XII intact. Normal strength, sensation and reflexes      throughout    Labs on Admission:  Basic Metabolic Panel: No results found for this basename: NA, K, CL, CO2, GLUCOSE, BUN, CREATININE, CALCIUM, MG, PHOS,  in the last 168 hours Liver Function Tests: No results found for this basename: AST, ALT, ALKPHOS, BILITOT, PROT, ALBUMIN,  in the last 168 hours No results found for this basename: LIPASE, AMYLASE,  in the last 168 hours No results found for this basename: AMMONIA,  in the last 168 hours CBC:  Recent Labs Lab 01/18/13 2057  WBC 2.0*  NEUTROABS 1.4*  HGB 8.4*  HCT 23.2*  MCV 100.0  PLT 132*   Cardiac Enzymes: No results found for this basename: CKTOTAL, CKMB, CKMBINDEX, TROPONINI,  in the last 168 hours  BNP (last 3 results)  Recent Labs  10/29/12 0510 10/31/12 0425 12/06/12 1850  PROBNP 3717.0* 5067.0* 2518.0*   CBG: No results found for this basename: GLUCAP,  in the last 168 hours  Radiological Exams on Admission: No results found.  EKG: Independently  reviewed.  Assessment/Plan Principal Problem:   Severe epistaxis Active Problems:   Anemia   Pancytopenia   Acute blood loss anemia   1. Severe epistaxis - bleed stopped with posterior packing which is still in place. 2. Acute blood loss anemia - on top of chronic pancytopenia - transfusing PRBC, recheck CBC in AM.    Code Status: Full  Family Communication: Family at bedside Disposition Plan: Admit to obs   Time spent: 50 min  Rad Gramling M. [facility] Pager [phone]  If 7AM-7PM, please contact the day team taking care of the patient [email or web address] Password [facility] 01/18/2013, 10:37 PM

## 2013-01-18 NOTE — ED Notes (Signed)
Here for nosebleed x 4 hours, onset spontaneously with no trauma. Report the bleeding to be constant in the past hours but it has at this current moment. Previous hx of epistaxis. Denied taking blood-thinners. Hx of anemia.

## 2013-01-18 NOTE — ED Provider Notes (Signed)
CSN: [id number]     Arrival date & time 01/18/13  1948 History   First MD Initiated Contact with Patient 01/18/13 2014     Chief Complaint  Patient presents with  . Epistaxis   (Consider location/radiation/quality/duration/timing/severity/associated sxs/prior Treatment) Patient is a 79 y.o. male presenting with nosebleeds. The history is provided by the patient and a relative.  Epistaxis Location:  L nare Severity:  Moderate Duration:  4 hours Timing:  Constant Progression:  Worsening Chronicity:  Recurrent Context: aspirin use   Context: not anticoagulants, not foreign body, not nose picking and not trauma   Relieved by:  Nothing Worsened by:  Nothing tried Ineffective treatments:  Applying pressure Associated symptoms: no congestion, no cough and no fever     Past Medical History  Diagnosis Date  . Allergic rhinitis   . Hyperlipidemia   . Exogenous obesity   . Edema     venous insufficiency  . Second degree Mobitz II AV block 12/11/08    with transient syncope, resolved s/p PPM  . Paroxysmal atrial fibrillation 03/16/11    diagnosed by PPM interrogation  . Pancytopenia   . Postural dizziness   . CHF (congestive heart failure)   . Pacemaker   . Dyspnea     "all the time w/the temporary pacer" (12/29/2012)  . Type II diabetes mellitus 2012  . History of blood transfusion     "3 recently" (12/29/2012)  . Daily headache     "recently w/the ATB" (12/29/2012)  . Arthritis     "hips; knees" (12/29/2012)  . 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    Past Surgical History  Procedure Laterality Date  . Lymph node biopsy  04/2005    groin  . Sternotomy  2000  . Pericardiectomy  2000  . Penile prosthesis implant      and removal  . Pacemaker insertion  12/11/08    MDT implanted by Dr Johney Frame  . Tee without cardioversion N/A 10/31/2012    Procedure: TRANSESOPHAGEAL ECHOCARDIOGRAM (TEE);  Surgeon: Lewayne Bunting, MD;   Location: [facility] ENDOSCOPY;  Service: Cardiovascular;  Laterality: N/A;  . Pacemaker lead removal Left 11/04/2012    Procedure: PACEMAKER LEAD REMOVAL;  Surgeon: Marinus Maw, MD;  Location: [facility] OR;  Service: Cardiovascular;  Laterality: Left;  . Temporary pacemaker insertion Left 11/04/2012    Procedure: TEMPORARY PACEMAKER INSERTION;  Surgeon: Marinus Maw, MD;  Location: [facility] OR;  Service: Cardiovascular;  Laterality: Left;  . Insert / replace / remove pacemaker  12/29/2012   Family History  Problem Relation Age of Onset  . Diabetes Mother   . Liver cancer Brother   . Cancer Sister    History  Substance Use Topics  . Smoking status: Former Smoker -- 1.00 packs/day for 30 years    Types: Cigarettes    Quit date: 01/27/1968  . Smokeless tobacco: Former Neurosurgeon    Types: Chew  . Alcohol Use: No    Review of Systems  Constitutional: Negative for fever.  HENT: Positive for nosebleeds. Negative for congestion.   Respiratory: Negative for cough.   All other systems reviewed and are negative.    Allergies  Review of patient's allergies indicates no known allergies.  Home Medications   Current Outpatient Rx  Name  Route  Sig  Dispense  Refill  . acetaminophen (TYLENOL) 500 MG tablet   Oral   Take 500-1,000 mg by mouth every 6 (six)  hours as needed for mild pain or headache.          . albuterol (PROVENTIL HFA;VENTOLIN HFA) 108 (90 BASE) MCG/ACT inhaler   Inhalation   Inhale 2 puffs into the lungs every 4 (four) hours as needed for wheezing or shortness of breath.          [name] aspirin 81 MG tablet   Oral   Take 81 mg by mouth at bedtime. (hold if bleeding)         . calcium-vitamin D (OS-CAL 500 + D) 500-200 MG-UNIT per tablet   Oral   Take 1 tablet by mouth 2 (two) times daily.          . cetirizine (ZYRTEC) 10 MG tablet   Oral   Take 10 mg by mouth at bedtime as needed for allergies.         . Cholecalciferol (VITAMIN D) 1000 UNITS capsule   Oral   Take 1,000  Units by mouth 2 (two) times daily.           [name] dextromethorphan-guaiFENesin (MUCINEX DM) 30-600 MG per 12 hr tablet   Oral   Take 1 tablet by mouth every 12 (twelve) hours as needed (cough).          . famotidine (PEPCID) 20 MG tablet   Oral   Take 20 mg by mouth at bedtime.          . finasteride (PROSCAR) 5 MG tablet   Oral   Take 5 mg by mouth every morning.          . furosemide (LASIX) 40 MG tablet   Oral   Take 1 tablet (40 mg total) by mouth daily. May take 1 extra daily if needed for swelling in legs   30 tablet   0   . hydrocortisone cream 1 %   Topical   Apply 1 application topically 2 (two) times daily as needed (itchy rash).          [name] levothyroxine (SYNTHROID, LEVOTHROID) 25 MCG tablet   Oral   Take 1 tablet (25 mcg total) by mouth daily before breakfast.   30 tablet   5   . metFORMIN (GLUCOPHAGE) 500 MG tablet   Oral   Take 500 mg by mouth 2 (two) times daily with a meal.          . Methylcellulose, Laxative, (CITRUCEL PO)   Oral   Take 1 tablet by mouth daily.         . mometasone (NASONEX) 50 MCG/ACT nasal spray   Nasal   Place 2 sprays into the nose 2 (two) times daily.          . mometasone-formoterol (DULERA) 200-5 MCG/ACT AERO   Inhalation   Inhale 2 puffs into the lungs 2 (two) times daily.         . Multiple Vitamin (MULTIVITAMIN) tablet   Oral   Take 1 tablet by mouth daily.           [name] omeprazole (PRILOSEC) 20 MG capsule   Oral   Take 40 mg by mouth daily before breakfast.          . oxybutynin (DITROPAN-XL) 10 MG 24 hr tablet   Oral   Take 10 mg by mouth at bedtime.         . polyethylene glycol powder (MIRALAX) powder   Oral   Take 17 g by mouth at bedtime.          [name]  potassium chloride SA (K-DUR,KLOR-CON) 20 MEQ tablet   Oral   Take 20 mEq by mouth daily.         . traMADol (ULTRAM) 50 MG tablet   Oral   Take 50-100 mg by mouth every 6 (six) hours as needed for pain (cough).         .  zolpidem (AMBIEN) 5 MG tablet   Oral   Take 1 tablet (5 mg total) by mouth at bedtime as needed for sleep.   10 tablet   0    BP 107/64  Pulse 119  Temp(Src) 97.9 F (36.6 C)  Resp 18  SpO2 95% Physical Exam  Nursing note and vitals reviewed. Constitutional: He is oriented to person, place, and time. He appears well-developed and well-nourished.  Non-toxic appearance. No distress.  HENT:  Head: Normocephalic and atraumatic.  No evidence of hemorrhage at the anterior septum.  Eyes: Conjunctivae, EOM and lids are normal. Pupils are equal, round, and reactive to light.  Neck: Normal range of motion. Neck supple. No tracheal deviation present. No mass present.  Cardiovascular: Regular rhythm and normal heart sounds.  Tachycardia present.  Exam reveals no gallop.   No murmur heard. Pulmonary/Chest: Effort normal and breath sounds normal. No stridor. No respiratory distress. He has no decreased breath sounds. He has no wheezes. He has no rhonchi. He has no rales.  Abdominal: Soft. Normal appearance and bowel sounds are normal. He exhibits no distension. There is no tenderness. There is no rebound and no CVA tenderness.  Musculoskeletal: Normal range of motion. He exhibits no edema and no tenderness.  Neurological: He is alert and oriented to person, place, and time. He has normal strength. No cranial nerve deficit or sensory deficit. GCS eye subscore is 4. GCS verbal subscore is 5. GCS motor subscore is 6.  Skin: Skin is warm and dry. No abrasion and no rash noted.  Psychiatric: He has a normal mood and affect. His speech is normal and behavior is normal.    ED Course  EPISTAXIS MANAGEMENT Date/Time: 01/18/2013 10:23 PM Performed by: Toy Baker Authorized by: Lorre Nick T Consent: Verbal consent obtained. Consent given by: patient Time out: Immediately prior to procedure a "time out" was called to verify the correct patient, procedure, equipment, support staff and site/side  marked as required. Patient sedated: no Treatment site: left posterior Repair method: nasal balloon Post-procedure assessment: bleeding stopped Treatment complexity: complex Patient tolerance: Patient tolerated the procedure well with no immediate complications.   (including critical care time) Labs Review Labs Reviewed  CBC WITH DIFFERENTIAL   Imaging Review No results found.  EKG Interpretation   None       MDM  No diagnosis found. Patient's nosebleed has been controlled. Patient has dropped 2 g of hemoglobin and blood transfusion has been ordered. I spoke with the pulmonary physician and was referred to the hospitalist service. Patient rechecked and no increased bleeding noted    Toy Baker, MD 01/18/13 2224

## 2013-01-19 DIAGNOSIS — R109 Unspecified abdominal pain: Secondary | ICD-10-CM

## 2013-01-19 LAB — GLUCOSE, CAPILLARY
Glucose-Capillary: 120 mg/dL — ABNORMAL HIGH (ref 70–99)
Glucose-Capillary: 126 mg/dL — ABNORMAL HIGH (ref 70–99)

## 2013-01-19 LAB — BASIC METABOLIC PANEL
BUN: 17 mg/dL (ref 6–23)
CO2: 25 mEq/L (ref 19–32)
Calcium: 9 mg/dL (ref 8.4–10.5)
Glucose, Bld: 206 mg/dL — ABNORMAL HIGH (ref 70–99)
Potassium: 4.1 mEq/L (ref 3.5–5.1)
Sodium: 131 mEq/L — ABNORMAL LOW (ref 135–145)

## 2013-01-19 LAB — CBC
HCT: 24.6 % — ABNORMAL LOW (ref 39.0–52.0)
Hemoglobin: 8.8 g/dL — ABNORMAL LOW (ref 13.0–17.0)
MCH: 35.2 pg — ABNORMAL HIGH (ref 26.0–34.0)
RBC: 2.5 MIL/uL — ABNORMAL LOW (ref 4.22–5.81)

## 2013-01-19 MED ORDER — TRAZODONE HCL 50 MG PO TABS
50.0000 mg | ORAL_TABLET | Freq: Every evening | ORAL | Status: DC | PRN
  Administered 2013-01-19 – 2013-01-20 (×2): 50 mg via ORAL
  Filled 2013-01-19 (×2): qty 1

## 2013-01-19 MED ORDER — VITAMINS A & D EX OINT
TOPICAL_OINTMENT | CUTANEOUS | Status: AC
  Administered 2013-01-19: 10:00:00 via TOPICAL
  Filled 2013-01-19: qty 5

## 2013-01-19 NOTE — Progress Notes (Signed)
Critical WBC = 1.4 Notified Dr. Izola Price

## 2013-01-19 NOTE — Progress Notes (Signed)
Rhino rocket in situ left nares, no signs of bleeding

## 2013-01-19 NOTE — Progress Notes (Signed)
Patient ID: [name], male   DOB: [birth year], 79 y.o.   MRN: [id number]  [facility] PROGRESS NOTE  [name]:[id number] DOB: [birth year] DOA: 01/18/2013 PCP: Victor Hughs, MD  Brief narrative: 79 y.o. male who presented to the ED with c/o epistaxis that started 4 hrs PTA,  No blood thinners at home, does have h/o pancytopenia. Normally transfused when his HGB gets down to 8. On CBC his HGB is down to 8.3 from 10 earlier this month. Posterior packing performed by EDP, bleeding stopped. Tachycardic in ED but not hypotensive. Hospitalist asked to admit.   Principal Problem:   Severe epistaxis - posterior packing in place - no bleeding since this was placed - Hg pending this AM - will ask ENT for further recommendations  Active Problems:   Anemia - of chronic disease and now with acute blood loss form epistaxis  - Hg pending this AM   Pancytopenia - follow up on CBC this AM   Status post pacemaker placement  - recently done 12/30/2012 by Dr. Johney [name] - secondary to Complete heart block with prior staph bacteremia status post dual chamber pacemaker placement    Follicular lymphoma - in remission    Diabetes mellitus, type II - continue Metformin   Consultants:  None  Procedures/Studies:  None  Antibiotics:  None  Code Status: Full Family Communication: Pt at bedside Disposition Plan: Home when medically stable  HPI/Subjective: No events overnight.   Objective: Filed Vitals:   01/19/13 0335 01/19/13 0430 01/19/13 0530 01/19/13 0615  BP: 113/71 116/75 119/78 110/72  Pulse: 103 100 99 95  Temp: 97.1 F (36.2 C) 98 F (36.7 C) 97.2 F (36.2 C) 97.8 F (36.6 C)  TempSrc: Oral Oral Oral Oral  Resp: 18 18 18 18   Height:      Weight:    103.602 kg (228 lb 6.4 oz)  SpO2: 96% 97% 97% 96%    Intake/Output Summary (Last 24 hours) at 01/19/13 0732 Last data filed at 01/19/13 0600  Gross per 24 hour  Intake 1176.25 ml  Output   1025 ml  Net  151.25 ml    Exam:   General:  Pt is alert, follows commands appropriately, not in acute distress  Cardiovascular: Regular rate and rhythm, S1/S2, no murmurs, no rubs, no gallops  Respiratory: Clear to auscultation bilaterally, no wheezing, no crackles, no rhonchi  Abdomen: Soft, non tender, non distended, bowel sounds present, no guarding  Extremities: No edema, pulses DP and PT palpable bilaterally  Neuro: Grossly nonfocal  Data Reviewed: Basic Metabolic Panel:  Recent Labs Lab 01/18/13 2057  NA 132*  K 3.8  CL 94*  CO2 24  GLUCOSE 149*  BUN 20  CREATININE 0.74  CALCIUM 10.1   CBC:  Recent Labs Lab 01/18/13 2057  WBC 2.0*  NEUTROABS 1.4*  HGB 8.4*  HCT 23.2*  MCV 100.0  PLT 132*    Scheduled Meds: . calcium-vitamin D  1 tablet Oral BID  . cholecalciferol  1,000 Units Oral BID  . famotidine  20 mg Oral QHS  . finasteride  5 mg Oral q morning - 10a  . levothyroxine  25 mcg Oral QAC breakfast  . loratadine  10 mg Oral Daily  . metFORMIN  500 mg Oral BID WC  . mometasone-formoterol  2 puff Inhalation BID  . multivitamin with minerals  1 tablet Oral Daily  . oxybutynin  10 mg Oral QHS  . pantoprazole  40 mg Oral Daily  .  polyethylene glycol  17 g Oral QHS  . sodium chloride  3 mL Intravenous Q12H   Continuous Infusions: . sodium chloride 125 mL/hr at 01/19/13 0600   Victor Presto, MD  [facility] Pager [phone]  If 7PM-7AM, please contact night-coverage [email or web address] Password [email or web address] 01/19/2013, 7:32 AM   LOS: 1 day

## 2013-01-19 NOTE — Progress Notes (Signed)
Telemetry discontinued, central telemetry notified

## 2013-01-20 LAB — TYPE AND SCREEN
Antibody Screen: NEGATIVE
Unit division: 0

## 2013-01-20 LAB — BASIC METABOLIC PANEL
BUN: 14 mg/dL (ref 6–23)
CO2: 26 mEq/L (ref 19–32)
Calcium: 9.1 mg/dL (ref 8.4–10.5)
Chloride: 99 mEq/L (ref 96–112)
GFR calc Af Amer: >90 mL/min (ref >90)
Glucose, Bld: 138 mg/dL — ABNORMAL HIGH (ref 70–99)
Potassium: 4.2 mEq/L (ref 3.5–5.1)

## 2013-01-20 LAB — CBC
HCT: 23.2 % — ABNORMAL LOW (ref 39.0–52.0)
Hemoglobin: 8 g/dL — ABNORMAL LOW (ref 13.0–17.0)
MCH: 33.8 pg (ref 26.0–34.0)
RBC: 2.37 MIL/uL — ABNORMAL LOW (ref 4.22–5.81)
RDW: 22.7 % — ABNORMAL HIGH (ref 11.5–15.5)
WBC: 1.8 10*3/uL — ABNORMAL LOW (ref 4.0–10.5)

## 2013-01-20 LAB — GLUCOSE, CAPILLARY
Glucose-Capillary: 168 mg/dL — ABNORMAL HIGH (ref 70–99)
Glucose-Capillary: 126 mg/dL — ABNORMAL HIGH (ref 70–99)
Glucose-Capillary: 142 mg/dL — ABNORMAL HIGH (ref 70–99)

## 2013-01-20 NOTE — Discharge Summary (Signed)
Physician Discharge Summary  SAMAD THON ZOX:[id number] DOB: April 24, 1932 DOA: 01/18/2013  PCP: Sandrea Hughs, MD  Admit date: 01/18/2013 Discharge date: 01/20/2013  Recommendations for Outpatient Follow-up:  1. Pt will need to follow up with PCP in 2-3 weeks post discharge 2. Please obtain BMP to evaluate electrolytes and kidney function 3. Please also check CBC to evaluate Hg and Hct levels 4. Please note that pt was advised to keep posterior packing for 3 more days post discharge to complete all 5 days therapy as recommended  5. I gave pt number for ENT office to call as soon as possible to schedule and appointment   Discharge Diagnoses: Epistaxis  Principal Problem:   Severe epistaxis Active Problems:   Anemia   Pancytopenia   Acute blood loss anemia   Discharge Condition: Stable  Diet recommendation: Heart healthy diet discussed in details   Brief narrative:  79 y.o. male who presented to the ED with c/o epistaxis that started 4 hrs PTA, No blood thinners at home, does have h/o pancytopenia. Normally transfused when his HGB gets down to 8. On CBC his HGB is down to 8.3 from 10 earlier this month. Posterior packing performed by EDP, bleeding stopped. Tachycardic in ED but not hypotensive. Hospitalist asked to admit.   Principal Problem:  Severe epistaxis  - posterior packing in place day #3, no bleeding since the packing applied  - Hg overall stable ~8 - per Dr. Pollyann Kennedy ENT specialist, recommendation is to keep packing in for total of 5 days and follow up with ENT for removal  Active Problems:  Anemia  - of chronic disease and now with acute blood loss form epistaxis  - Hg ~8 Pancytopenia  - follow up on CBC in an outpatient setting  Status post pacemaker placement  - recently done 12/30/2012 by Dr. Johney Frame  - secondary to Complete heart block with prior staph bacteremia status post dual chamber pacemaker placement  Follicular lymphoma  - in remission  Diabetes  mellitus, type II  - continue Metformin   Consultants:  None Procedures/Studies:  None Antibiotics:  None  Code Status: Full  Family Communication: Pt and daughter at bedside   Discharge Exam: Filed Vitals:   01/20/13 0520  BP: 117/70  Pulse: 100  Temp: 98.5 F (36.9 C)  Resp: 16   Filed Vitals:   01/19/13 1438 01/19/13 2152 01/19/13 2244 01/20/13 0520  BP: 120/71  123/75 117/70  Pulse: 96  105 100  Temp: 97.7 F (36.5 C)  98.5 F (36.9 C) 98.5 F (36.9 C)  TempSrc: Oral  Oral Oral  Resp: 20  18 16   Height:      Weight:      SpO2: 97% 95% 92% 90%    General: Pt is alert, follows commands appropriately, not in acute distress Cardiovascular: Regular rate and rhythm, S1/S2 +, no murmurs, no rubs, no gallops Respiratory: Clear to auscultation bilaterally, no wheezing, no crackles, no rhonchi Abdominal: Soft, non tender, non distended, bowel sounds +, no guarding Extremities: no edema, no cyanosis, pulses palpable bilaterally DP and PT Neuro: Grossly nonfocal  Discharge Instructions  Discharge Orders   Future Appointments Provider Department Dept Phone   01/23/2013 2:15 PM Nyoka Cowden, MD [facility] [phone]   01/27/2013 1:30 PM [facility] [phone]   01/27/2013 2:00 PM [facility] Inj Nurse [facility] [phone]   02/17/2013 1:15 PM [facility] 2 [facility]  MEDICAL ONCOLOG[facility] [phone]   02/17/2013 1:45 PM Myrtis Ser, NP [facility] [phone]   02/17/2013 2:15 PM [facility] [phone]   04/06/2013 10:15 AM Hillis Range, MD [facility] [phone]   Future Orders Complete By Expires   Diet - low sodium heart healthy  As directed    Increase activity slowly  As directed        Medication List         albuterol 108 (90 BASE)  MCG/ACT inhaler  Commonly known as:  PROVENTIL HFA;VENTOLIN HFA  Inhale 2 puffs into the lungs every 4 (four) hours as needed for wheezing or shortness of breath.     aspirin 81 MG tablet  Take 81 mg by mouth at bedtime. (hold if bleeding)     cetirizine 10 MG tablet  Commonly known as:  ZYRTEC  Take 10 mg by mouth at bedtime as needed for allergies.     famotidine 20 MG tablet  Commonly known as:  PEPCID  Take 20 mg by mouth at bedtime.     finasteride 5 MG tablet  Commonly known as:  PROSCAR  Take 5 mg by mouth every morning.     furosemide 40 MG tablet  Commonly known as:  LASIX  Take 1 tablet (40 mg total) by mouth daily. May take 1 extra daily if needed for swelling in legs     hydrocortisone cream 1 %  Apply 1 application topically 2 (two) times daily as needed (itchy rash).     levothyroxine 25 MCG tablet  Commonly known as:  SYNTHROID, LEVOTHROID  Take 1 tablet (25 mcg total) by mouth daily before breakfast.     metFORMIN 500 MG tablet  Commonly known as:  GLUCOPHAGE  Take 500 mg by mouth 2 (two) times daily with a meal.     MIRALAX powder  Generic drug:  polyethylene glycol powder  Take 17 g by mouth at bedtime.     mometasone 50 MCG/ACT nasal spray  Commonly known as:  NASONEX  Place 2 sprays into the nose 2 (two) times daily.     mometasone-formoterol 200-5 MCG/ACT Aero  Commonly known as:  DULERA  Inhale 2 puffs into the lungs 2 (two) times daily.     MUCINEX DM 30-600 MG per 12 hr tablet  Generic drug:  dextromethorphan-guaiFENesin  Take 1 tablet by mouth every 12 (twelve) hours as needed (cough).     multivitamin tablet  Take 1 tablet by mouth daily.     omeprazole 20 MG capsule  Commonly known as:  PRILOSEC  Take 40 mg by mouth daily before breakfast.     OS-CAL 500 + D 500-200 MG-UNIT per tablet  Generic drug:  calcium-vitamin D  Take 1 tablet by mouth 2 (two) times daily.     oxybutynin 10 MG 24 hr tablet  Commonly known as:  DITROPAN-XL   Take 10 mg by mouth daily.     potassium chloride SA 20 MEQ tablet  Commonly known as:  K-DUR,KLOR-CON  Take 20 mEq by mouth daily.     traMADol 50 MG tablet  Commonly known as:  ULTRAM  Take 50-100 mg by mouth every 6 (six) hours as needed for pain (cough).     traZODone 50 MG tablet  Commonly known as:  DESYREL  Take 50 mg by mouth at bedtime.     TYLENOL 500 MG tablet  Generic drug:  acetaminophen  Take 500-1,000 mg by mouth every 6 (six) hours as needed for mild pain or headache.     Vitamin D 1000 UNITS capsule  Take 1,000 Units by mouth 2 (two) times daily.     zolpidem 5 MG tablet  Commonly known as:  AMBIEN  Take 1 tablet (5 mg total) by mouth at bedtime as needed for sleep.           Follow-up Information   Follow up with Sandrea Hughs, MD In 4 weeks.   Specialty:  Pulmonary Disease   Contact information:   [address] [place] [address] [phone]       Follow up with Flo Shanks, MD On 01/23/2013. (office will call you for the details on appointment time and date )    Specialty:  Otolaryngology   Contact information:   [address] [place] [address] [phone]       Follow up with Debbora Presto, MD. (As needed if symptoms worsen call my cell phone [phone])    Specialty:  Internal Medicine   Contact information:   [address] [place] [address] [phone]        The results of significant diagnostics from this hospitalization (including imaging, microbiology, ancillary and laboratory) are listed below for reference.     Microbiology: No results found for this or any previous visit (from the past 240 hour(s)).   Labs: Basic Metabolic Panel:  Recent Labs Lab 01/18/13 2057 01/19/13 0830 01/20/13 0526  NA 132* 131* 133*  K 3.8 4.1 4.2  CL 94* 94* 99  CO2 24 25 26   GLUCOSE 149* 206* 138*  BUN 20 17 14   CREATININE 0.74 0.81 0.71  CALCIUM 10.1 9.0 9.1   CBC:  Recent Labs Lab  01/18/13 2057 01/19/13 0830 01/20/13 0526  WBC 2.0* 1.4* 1.8*  NEUTROABS 1.4*  --   --   HGB 8.4* 8.8* 8.0*  HCT 23.2* 24.6* 23.2*  MCV 100.0 98.4 97.9  PLT 132* 110* 103*   BNP: BNP (last 3 results)  Recent Labs  10/29/12 0510 10/31/12 0425 12/06/12 1850  PROBNP 3717.0* 5067.0* 2518.0*   CBG:  Recent Labs Lab 01/19/13 1205 01/19/13 1638 01/19/13 2315 01/20/13 0746 01/20/13 1136  GLUCAP 124* 126* 168* 126* 142*   SIGNED: Time coordinating discharge: Over 30 minutes  Debbora Presto, MD  [facility] 01/20/2013, 12:10 PM Pager [phone]  If 7PM-7AM, please contact night-coverage [email or web address] Password [email or web address]

## 2013-01-23 ENCOUNTER — Encounter: Payer: Self-pay | Admitting: Internal Medicine

## 2013-01-23 ENCOUNTER — Ambulatory Visit (INDEPENDENT_AMBULATORY_CARE_PROVIDER_SITE_OTHER): Payer: Medicare Other | Admitting: Internal Medicine

## 2013-01-23 VITALS — BP 116/58 | HR 98 | Temp 97.7°F | Ht 71.0 in | Wt 237.0 lb

## 2013-01-23 DIAGNOSIS — E1365 Other specified diabetes mellitus with hyperglycemia: Secondary | ICD-10-CM

## 2013-01-23 DIAGNOSIS — IMO0002 Reserved for concepts with insufficient information to code with codable children: Secondary | ICD-10-CM

## 2013-01-23 DIAGNOSIS — I10 Essential (primary) hypertension: Secondary | ICD-10-CM

## 2013-01-23 DIAGNOSIS — E669 Obesity, unspecified: Secondary | ICD-10-CM

## 2013-01-23 DIAGNOSIS — E039 Hypothyroidism, unspecified: Secondary | ICD-10-CM

## 2013-01-23 DIAGNOSIS — J45909 Unspecified asthma, uncomplicated: Secondary | ICD-10-CM

## 2013-01-23 DIAGNOSIS — R040 Epistaxis: Secondary | ICD-10-CM

## 2013-01-23 MED ORDER — TRAMADOL HCL 50 MG PO TABS: 50.0000 mg | ORAL_TABLET | Freq: Four times a day (QID) | ORAL | Status: DC | PRN

## 2013-01-23 NOTE — Patient Instructions (Addendum)
See Tammy NP 6  weeks with all your medications, even over the counter meds, separated in two separate bags, the ones you take no matter what vs the ones you stop once you feel better and take only as needed when you feel you need them.   [name]  will generate for you a new user friendly medication calendar that will put [place] all on the same page re: your medication use.     Without this process, it simply isn't possible to assure that we are providing  your outpatient care  with  the attention to detail we feel you deserve.   If we cannot assure that you're getting that kind of care,  then we cannot manage your problem effectively from this clinic.  Once you have seen [name] and we are sure that we're all on the same page with your medication use she will arrange follow up with me.  Late add:  Never had tsh repeated still showing as "future" in lab section

## 2013-01-23 NOTE — Progress Notes (Signed)
Subjective:     Patient ID: [name], male   DOB: Mar 06, 1932    MRN: [id number]  Brief patient profile:  22 yowm quit smoking 1970 with morbid obesity with boderline DM, Lymphoma s/p chemo ([facility]) and tendency to peripheral edema that is felt to be dependent and related to obesity/ venous insuff.   History of Present Illness  09/05/2012 acute  ov/[name] new problem = rash, breathing and cough resolved Chief Complaint  Patient presents with  . Acute Visit    Pt c/o rash and burning sensation left shoulder radiating down left arm- started to notice about a wk ago  initially though to be shingles but rash is actually bilateral upper arms and abd wall, itching rather than painful, no assoc fever or ha, arthralgias  >>refer to derm , vistaril rx   09/19/2012 Follow up  Returns for follow up for memory issues.  More forgetful. Family noticing trouble with memory as well.  MMSE today was 27/30, had difficulty with answers.  Clock draw was okay but took him a long time.  Had real trouble writing a sentence-he finally gave up.  No FH of Dementia per pt.  No headache, syncope, seizure act, visual/speech changes.  rec No change rx Neuro referral     09/30/2012 acute  ov/[name] re "abd pain" Chief Complaint  Patient presents with  . Acute Visit    Pt c/o low abd pain and right side pain x 2 days. He also states urine has been dark in color for the past 3 days.   has not taken any pain med, no rigors, no n or v, pain worse across both sides of back when bends over. Remembers "missing a step" prior to onset of back pain which forms a c around upper abd but is equal on both sides. No sob.  No change chronic cough or worse pain with cough rec As per calendar, take the tramadol 50 mg up to every 4 hours as needed for pain   Admit date: 10/27/2012  Discharge date: 11/08/2012  Primary Care Physician: [name], [name]  Final Discharge Diagnoses:  Staph aureus bacteremia/sepsis of unclear  etiology, likely with assumption that the pacemaker is contaminated  Secondary diagnosis . acute encephalopathy . Abdominal pain . Bacteremia due to Staphylococcus aureus . generalized debility  . Other pancytopenia . Hyponatremia . LYMPHOMA NEC, MLIG, INGUINAL/LOWER LIMB . Essential hypertension, benign . GERD . DM (diabetes mellitus), secondary uncontrolled    10/30 /2014 post hosp f/u ov/[name] re: asthma/ dm/ sp staph bacteremia Chief Complaint  Patient presents with  . Follow-up    Pt states recently d/ced from [name] on 11/08/12- feeling fatigued since then.    In the process of completing 4 weeks IV abx with removal of permanent pacemaker f/u per Dr [name] Not limited by breathing, rarely needing saba, no sign  cough. Fasting sugars in the 120-140 range. >>no changes  01/02/13 [name] follow up  Admitted originally 12/06/2012 -12/11/12 then readmitted 12/4-5 . Admitted with  MSSA bacteremia secondary to infected pacemaker-explanted . Tx w/ prolonged abx with ID consult . Stay complicated by  Pancytopenia had 2 units PRBC transfusion . - Follows with Dr. Alen [name] of the oncology service.  Had volume overload.  Improved with aggressive diuresis.  . The patient lost over 10 kg and 13L during his hospitalization. The patient's lower extremity edema improved. The patient will be discharged home to finish his cefazolin with his last dose on 12/16/2012. After initial  improvement of the patient's sodium, his sodium trended back down. Nephrology was consulted. They agreed with current management. In light of the patient's worsened serum sodium, they recommended starting Synthroid based upon the patient's thyroid function studies. The patient will be sent home with Synthroid 25 mcg daily.   Says  doing "great" since discharge.  no new complaints.  does have medication questions concerning Lexapro replacement, Trazodone vs Ambien, Levothyroxine  Pt needs tsh check now on  levothyroxine. Lexapro was stopped due to low sodium. Denies depression flare.  Can not take ambien. Does take 1/3 of trazodone.  meds reviewed with updated list.  rec Continue on current regimen.  Has a TSH done later this week when you have labs done at oncology> never done    01/23/2013 f/u ov/[name] re: weakness Chief Complaint  Patient presents with  . Follow-up    Pt c/o SOB with exertion.  Sinuses are packed due to nosebleed since christmas eve.       No obvious day to day or daytime variabilty or assoc chronic cough or cp or chest tightness, subjective wheeze overt  hb symptoms. No unusual exp hx or h/o childhood pna/ asthma or knowledge of premature birth.  Sleeping ok without nocturnal  or early am exacerbation  of respiratory  c/o's or need for noct saba. Also denies any obvious fluctuation of symptoms with weather or environmental changes or other aggravating or alleviating factors except as outlined above   Current Medications, Allergies, Complete Past Medical History, Past Surgical History, Family History, and Social History were reviewed in [employer] record.  ROS  The following are not active complaints unless bolded sore throat, dysphagia, dental problems, itching, sneezing,  nasal congestion or excess/ purulent secretions, ear ache,   fever, chills, sweats, unintended wt loss, pleuritic or exertional cp, hemoptysis,  orthopnea pnd or leg swelling, presyncope, palpitations, heartburn, abdominal pain, anorexia, nausea, vomiting, diarrhea  or change in bowel or urinary habits, change in stools or urine, dysuria,hematuria,  rash, arthralgias, visual complaints, headache, numbness weakness or ataxia or problems with walking or coordination,  change in mood/affect or memory.                      Past Medical History:   LYMPHOMA NEC, MLIG, INGUINAL/LOWER LIMB (ICD-202.85) dx 04/2005.......[facility]  - last chemo 10/2006 -repeat CT/ABD CT  11/18/07--no reccurence  - Pancytopenia August 06, 2009 > refer back to [facility] > aranesp rx Jan 2012  RHINITIS, ALLERGIC NOS (ICD-477.9)  HYPERLIPIDEMIA NEC/NOS (ICD-272.4)  EXOGENOUS OBESITY (ICD-278.00)  - ideal body weight less than 186  AODM onset 2012 with freq pred for airways issues -HgA1C 5.5 12/12 >metformin decreased 500mg  1/2 Twice daily   Vitamin D Deficiency- level 29>>44 (4/9//10)  Left Hip pain onset around 6/09............................................................................   [facility]  - MRI 11/23/2007 c/w L1-2 bulging disc indents the thecal sac with foraminal stenosis  HEALTH MAINTENANCE..........................................................................................[facility]  - Td 10/2005  - Pneumovax 10/07 second shot  - CPX 08/20/2010  --colon 02/2005 -int/extern. hems (repeat q7y)  Complex med regimen  --Meds reviewed with pt education and computerized med calendar completed/adjusted. November 08, 2008 , August 21, 2009 updated 02/19/2011 , 10/23/2011  Syncope...........................................................................................................[facility] KitchenHochrein  - Mobitz II AV block s/p Medtronic pacemaker placement 12/11/2008  [facility].....................................................................................................[facility]  - Pruritic rash 04/2009 > tried lotrisone, [facility] to [facility] August 06, 2009  Memory impairment , MMSE 27/30 8/25>refer to neuro   Family History:   mother had diabetes  Social History:  Patient states former smoker.  quit smoking in 1970  No ETOH  Retired            Objective:   Physical Exam wt 244 January 16, 2008 > 249 April 24, 2009 > 249 10/06/2010 >  11/27/2010  245 > 01/07/2011 241 > 02/19/11 238 > 06/04/2011  247 >07/03/2011 246 > 07/31/2011  248 > 09/11/2011  247 >246 10/23/2011 > 258 02/15/2012 > 05/19/2012  258 > 08/19/2012 255 >  266 09/05/12>  11/24/12 256>235 01/02/13 > 237   01/23/2013  237 W/c bound obese wm in no acute distress but seems overall much more frail  HEENT: nl dentition, turbinates, and orophanx.  Mucus membranes pink and moist. No tongue or throat exudate.  Neck: supple with full ROM. no JVD, node enlargement or TMJ   Lungs: clear and equal bilateral breath sounds. No wheezing, rhonchi or rales  Chest: RRR. No murmurs, rubs, or gallops.  Tr- peripheral edema.  Abd: obese but soft and no rebound or focal tenderness. Normal excursion in the supine position.  Ext warm and dry, no calf tenderness, cyanosis or clubbing. mild/mod chronic venous insufficiency changes. Varicose veins present. Neuro: nl sensorium and gait..  Skin:   No rash    12/30/12 cxr Left-sided pacemaker has been removed.  Interval placement of dual lead pacemaker from right-sided approach.  Right atrial and right ventricular leads in good position. No  pneumothorax  Cardiac enlargement without heart failure infiltrate or effusion.   Assessment:

## 2013-01-24 ENCOUNTER — Encounter: Payer: Self-pay | Admitting: *Deleted

## 2013-01-24 NOTE — Assessment & Plan Note (Signed)
ent f/u planned this week

## 2013-01-24 NOTE — Assessment & Plan Note (Signed)
-   New onset 2012 while on freq doses of pred for airways dz >>Nutritionist referral 06/20/2010  And started on metfomin 07/02/10  Hemoglobin A1C  Date Value Range Status  12/09/2012 6.1* <5.7 % Final     (NOTE)                                                                               According to the ADA Clinical Practice Recommendations for 2011, when     HbA1c is used as a screening test:      >=6.5%   Diagnostic of Diabetes Mellitus               (if abnormal result is confirmed)     5.7-6.4%   Increased risk of developing Diabetes Mellitus     References:Diagnosis and Classification of Diabetes Mellitus,Diabetes     Care,2011,34(Suppl 1):S62-S69 and Standards of Medical Care in             Diabetes - 2011,Diabetes Care,2011,34 (Suppl 1):S11-S61.  08/19/2012 7.1* 4.6 - 6.5 % Final     Glycemic Control Guidelines for People with Diabetes:Non Diabetic:  <6%Goal of Therapy: <7%Additional Action Suggested:  >8%   05/19/2012 6.6* 4.6 - 6.5 % Final     Glycemic Control Guidelines for People with Diabetes:Non Diabetic:  <6%Goal of Therapy: <7%Additional Action Suggested:  >8%    Adequate control on present rx, reviewed > no change in rx needed

## 2013-01-24 NOTE — Assessment & Plan Note (Signed)
Recheck tsh next ov on synthroid 25 mcg per day

## 2013-01-24 NOTE — Assessment & Plan Note (Signed)
Lab Results  Component Value Date   TSH 13.348* 12/06/2012     Needs recheck

## 2013-01-24 NOTE — Assessment & Plan Note (Signed)
-   hfa   08/19/2012  75%   - trial of dulera 200 08/19/2012  > improved 09/05/12  All goals of chronic asthma control met including optimal function and elimination of symptoms with minimal need for rescue therapy.  Contingencies discussed in full including contacting this office immediately if not controlling the symptoms using the rule of two's.

## 2013-01-24 NOTE — Assessment & Plan Note (Signed)
Adequate control on present rx, reviewed > no change in rx needed   

## 2013-01-25 ENCOUNTER — Telehealth: Payer: Self-pay | Admitting: *Deleted

## 2013-01-25 ENCOUNTER — Other Ambulatory Visit: Payer: Self-pay | Admitting: Oncology

## 2013-01-25 DIAGNOSIS — D61818 Other pancytopenia: Secondary | ICD-10-CM

## 2013-01-25 NOTE — Telephone Encounter (Signed)
VERBAL ORDER AND READ BACK TO DR.SHADAD- IF PT. FEELS BAD HE NEEDS TO GO TO THE EMERGENCY ROOM. OTHERWISE KEEP HIS LAB APPOINTMENT ON 01/28/12 WITH THE POSSIBILITY OF A BLOOD TRANSFUSION. NOTIFIED PT. AND HIS FAMILY MEMBER. THEY VOICE UNDERSTANDING.

## 2013-01-26 ENCOUNTER — Other Ambulatory Visit: Payer: Self-pay | Admitting: Internal Medicine

## 2013-01-27 ENCOUNTER — Ambulatory Visit (HOSPITAL_BASED_OUTPATIENT_CLINIC_OR_DEPARTMENT_OTHER): Payer: Medicare Other

## 2013-01-27 ENCOUNTER — Other Ambulatory Visit: Payer: Self-pay | Admitting: Oncology

## 2013-01-27 ENCOUNTER — Ambulatory Visit: Payer: Medicare Other

## 2013-01-27 ENCOUNTER — Other Ambulatory Visit (HOSPITAL_BASED_OUTPATIENT_CLINIC_OR_DEPARTMENT_OTHER): Payer: Medicare Other

## 2013-01-27 ENCOUNTER — Ambulatory Visit (HOSPITAL_COMMUNITY)
Admission: RE | Admit: 2013-01-27 | Discharge: 2013-01-27 | Disposition: A | Discharge status: Home / Self Care | Payer: Medicare Other | Source: Ambulatory Visit | Attending: Oncology | Admitting: Oncology

## 2013-01-27 VITALS — BP 131/63 | HR 83 | Temp 97.0°F | Resp 21

## 2013-01-27 VITALS — BP 102/62 | HR 96 | Temp 97.7°F

## 2013-01-27 DIAGNOSIS — D61818 Other pancytopenia: Secondary | ICD-10-CM

## 2013-01-27 DIAGNOSIS — D649 Anemia, unspecified: Secondary | ICD-10-CM

## 2013-01-27 LAB — CBC WITH DIFFERENTIAL/PLATELET
BASO%: 1.6 % (ref 0.0–2.0)
Basophils Absolute: 0 10*3/uL (ref 0.0–0.1)
EOS ABS: 0 10*3/uL (ref 0.0–0.5)
EOS%: 1.7 % (ref 0.0–7.0)
HCT: 23.8 % — ABNORMAL LOW (ref 38.4–49.9)
HGB: 8.3 g/dL — ABNORMAL LOW (ref 13.0–17.1)
LYMPH#: 0.6 10*3/uL — AB (ref 0.9–3.3)
LYMPH%: 37.7 % (ref 14.0–49.0)
MCH: 35.2 pg — ABNORMAL HIGH (ref 27.2–33.4)
MCHC: 34.7 g/dL (ref 32.0–36.0)
MCV: 101.7 fL — ABNORMAL HIGH (ref 79.3–98.0)
MONO#: 0 10*3/uL — AB (ref 0.1–0.9)
MONO%: 0.5 % (ref 0.0–14.0)
NEUT#: 0.9 10*3/uL — ABNORMAL LOW (ref 1.5–6.5)
NEUT%: 58.5 % (ref 39.0–75.0)
Platelets: 130 10*3/uL — ABNORMAL LOW (ref 140–400)
RBC: 2.35 10*6/uL — ABNORMAL LOW (ref 4.20–5.82)
RDW: 25.3 % — ABNORMAL HIGH (ref 11.0–14.6)
WBC: 1.5 10*3/uL — AB (ref 4.0–10.3)

## 2013-01-27 LAB — HOLD TUBE, BLOOD BANK

## 2013-01-27 LAB — PREPARE RBC (CROSSMATCH)

## 2013-01-27 MED ORDER — HEPARIN SOD (PORK) LOCK FLUSH 100 UNIT/ML IV SOLN
250.0000 [IU] | INTRAVENOUS | Status: DC | PRN
  Filled 2013-01-27: qty 5

## 2013-01-27 MED ORDER — DIPHENHYDRAMINE HCL 25 MG PO CAPS
ORAL_CAPSULE | ORAL | Status: AC
  Filled 2013-01-27: qty 1

## 2013-01-27 MED ORDER — DIPHENHYDRAMINE HCL 25 MG PO CAPS
25.0000 mg | ORAL_CAPSULE | Freq: Once | ORAL | Status: AC
  Administered 2013-01-27: 25 mg via ORAL

## 2013-01-27 MED ORDER — HEPARIN SOD (PORK) LOCK FLUSH 100 UNIT/ML IV SOLN
500.0000 [IU] | Freq: Every day | INTRAVENOUS | Status: DC | PRN
  Filled 2013-01-27: qty 5

## 2013-01-27 MED ORDER — DARBEPOETIN ALFA-POLYSORBATE 300 MCG/0.6ML IJ SOLN
300.0000 ug | Freq: Once | INTRAMUSCULAR | Status: AC
  Administered 2013-01-27: 300 ug via SUBCUTANEOUS
  Filled 2013-01-27: qty 0.6

## 2013-01-27 MED ORDER — SODIUM CHLORIDE 0.9 % IV SOLN
250.0000 mL | Freq: Once | INTRAVENOUS | Status: AC
Start: 2013-01-27 — End: 2013-01-27
  Administered 2013-01-27: 250 mL via INTRAVENOUS

## 2013-01-27 MED ORDER — ACETAMINOPHEN 325 MG PO TABS
650.0000 mg | ORAL_TABLET | Freq: Once | ORAL | Status: AC
  Administered 2013-01-27: 650 mg via ORAL

## 2013-01-27 MED ORDER — SODIUM CHLORIDE 0.9 % IJ SOLN
10.0000 mL | INTRAMUSCULAR | Status: DC | PRN
  Filled 2013-01-27: qty 10

## 2013-01-27 MED ORDER — ACETAMINOPHEN 325 MG PO TABS
ORAL_TABLET | ORAL | Status: AC
  Filled 2013-01-27: qty 2

## 2013-01-27 MED ORDER — SODIUM CHLORIDE 0.9 % IJ SOLN
3.0000 mL | INTRAMUSCULAR | Status: DC | PRN
  Filled 2013-01-27: qty 10

## 2013-01-27 NOTE — Patient Instructions (Signed)
Blood Transfusion  A blood transfusion replaces your blood or some of its parts. Blood is replaced when you have lost blood because of surgery, an accident, or for severe blood conditions like anemia. You can donate blood to be used on yourself if you have a planned surgery. If you lose blood during that surgery, your own blood can be given back to you. Any blood given to you is checked to make sure it matches your blood type. Your temperature, blood pressure, and heart rate (vital signs) will be checked often.  GET HELP RIGHT AWAY IF:   You feel sick to your stomach (nauseous) or throw up (vomit).  You have watery poop (diarrhea).  You have shortness of breath or trouble breathing.  You have blood in your pee (urine) or have dark colored pee.  You have chest pain or tightness.  Your eyes or skin turn yellow (jaundice).  You have a temperature by mouth above 102 F (38.9 C), not controlled by medicine.  You start to shake and have chills.  You develop a a red rash (hives) or feel itchy.  You develop lightheadedness or feel confused.  You develop back, joint, or muscle pain.  You do not feel hungry (lost appetite).  You feel tired, restless, or nervous.  You develop belly (abdominal) cramps. Document Released: 04/10/2008 Document Revised: 04/06/2011 Document Reviewed: 04/10/2008 [employer] Patient Information 2014 [employer].  

## 2013-01-28 ENCOUNTER — Ambulatory Visit (HOSPITAL_BASED_OUTPATIENT_CLINIC_OR_DEPARTMENT_OTHER): Payer: Medicare Other

## 2013-01-28 VITALS — BP 112/81 | HR 82 | Temp 96.9°F | Resp 20

## 2013-01-28 DIAGNOSIS — D649 Anemia, unspecified: Secondary | ICD-10-CM

## 2013-01-28 LAB — PREPARE RBC (CROSSMATCH)

## 2013-01-28 MED ORDER — ACETAMINOPHEN 325 MG PO TABS
650.0000 mg | ORAL_TABLET | Freq: Once | ORAL | Status: AC
  Administered 2013-01-28: 650 mg via ORAL

## 2013-01-28 MED ORDER — DIPHENHYDRAMINE HCL 25 MG PO CAPS
ORAL_CAPSULE | ORAL | Status: AC
  Filled 2013-01-28: qty 1

## 2013-01-28 MED ORDER — SODIUM CHLORIDE 0.9 % IV SOLN
250.0000 mL | Freq: Once | INTRAVENOUS | Status: AC
  Administered 2013-01-28: 250 mL via INTRAVENOUS

## 2013-01-28 MED ORDER — DIPHENHYDRAMINE HCL 25 MG PO CAPS
25.0000 mg | ORAL_CAPSULE | Freq: Once | ORAL | Status: AC
  Administered 2013-01-28: 25 mg via ORAL

## 2013-01-28 MED ORDER — ACETAMINOPHEN 325 MG PO TABS
ORAL_TABLET | ORAL | Status: AC
  Filled 2013-01-28: qty 2

## 2013-01-28 NOTE — Patient Instructions (Signed)
Blood Transfusion Information WHAT IS A BLOOD TRANSFUSION? A transfusion is the replacement of blood or some of its parts. Blood is made up of multiple cells which provide different functions.  Red blood cells carry oxygen and are used for blood loss replacement.  White blood cells fight against infection.  Platelets control bleeding.  Plasma helps clot blood.  Other blood products are available for specialized needs, such as hemophilia or other clotting disorders. BEFORE THE TRANSFUSION  Who gives blood for transfusions?   You may be able to donate blood to be used at a later date on yourself (autologous donation).  Relatives can be asked to donate blood. This is generally not any safer than if you have received blood from a stranger. The same precautions are taken to ensure safety when a relative's blood is donated.  Healthy volunteers who are fully evaluated to make sure their blood is safe. This is blood bank blood. Transfusion therapy is the safest it has ever been in the practice of medicine. Before blood is taken from a donor, a complete history is taken to make sure that person has no history of diseases nor engages in risky social behavior (examples are intravenous drug use or sexual activity with multiple partners). The donor's travel history is screened to minimize risk of transmitting infections, such as malaria. The donated blood is tested for signs of infectious diseases, such as HIV and hepatitis. The blood is then tested to be sure it is compatible with you in order to minimize the chance of a transfusion reaction. If you or a relative donates blood, this is often done in anticipation of surgery and is not appropriate for emergency situations. It takes many days to process the donated blood. RISKS AND COMPLICATIONS Although transfusion therapy is very safe and saves many lives, the main dangers of transfusion include:   Getting an infectious disease.  Developing a  transfusion reaction. This is an allergic reaction to something in the blood you were given. Every precaution is taken to prevent this. The decision to have a blood transfusion has been considered carefully by your caregiver before blood is given. Blood is not given unless the benefits outweigh the risks. AFTER THE TRANSFUSION  Right after receiving a blood transfusion, you will usually feel much better and more energetic. This is especially true if your red blood cells have gotten low (anemic). The transfusion raises the level of the red blood cells which carry oxygen, and this usually causes an energy increase.  The nurse administering the transfusion will monitor you carefully for complications. HOME CARE INSTRUCTIONS  No special instructions are needed after a transfusion. You may find your energy is better. Speak with your caregiver about any limitations on activity for underlying diseases you may have. SEEK MEDICAL CARE IF:   Your condition is not improving after your transfusion.  You develop redness or irritation at the intravenous (IV) site. SEEK IMMEDIATE MEDICAL CARE IF:  Any of the following symptoms occur over the next 12 hours:  Shaking chills.  You have a temperature by mouth above 102 F (38.9 C), not controlled by medicine.  Chest, back, or muscle pain.  People around you feel you are not acting correctly or are confused.  Shortness of breath or difficulty breathing.  Dizziness and fainting.  You get a rash or develop hives.  You have a decrease in urine output.  Your urine turns a dark color or changes to pink, red, or brown. Any of the following   symptoms occur over the next 10 days:  You have a temperature by mouth above 102 F (38.9 C), not controlled by medicine.  Shortness of breath.  Weakness after normal activity.  The white part of the eye turns yellow (jaundice).  You have a decrease in the amount of urine or are urinating less often.  Your  urine turns a dark color or changes to pink, red, or brown. Document Released: 01/10/2000 Document Revised: 04/06/2011 Document Reviewed: 08/29/2007 ExitCare Patient Information 2014 [employer].  

## 2013-01-29 LAB — TYPE AND SCREEN
ABO/RH(D): A POS
Antibody Screen: NEGATIVE
UNIT DIVISION: 0
Unit division: 0

## 2013-01-30 ENCOUNTER — Telehealth: Payer: Self-pay | Admitting: Internal Medicine

## 2013-01-30 ENCOUNTER — Other Ambulatory Visit: Payer: Self-pay | Admitting: Internal Medicine

## 2013-01-30 DIAGNOSIS — J45909 Unspecified asthma, uncomplicated: Secondary | ICD-10-CM

## 2013-01-30 DIAGNOSIS — R0602 Shortness of breath: Secondary | ICD-10-CM

## 2013-01-30 NOTE — Telephone Encounter (Signed)
Referral has been placed and staff message sent to [facility].

## 2013-01-30 NOTE — Telephone Encounter (Signed)
I called and gave VO for PT to [facility]. Nothing further needed

## 2013-02-01 ENCOUNTER — Other Ambulatory Visit: Payer: Self-pay | Admitting: Internal Medicine

## 2013-02-03 DIAGNOSIS — E119 Type 2 diabetes mellitus without complications: Secondary | ICD-10-CM

## 2013-02-03 DIAGNOSIS — I10 Essential (primary) hypertension: Secondary | ICD-10-CM

## 2013-02-03 DIAGNOSIS — IMO0001 Reserved for inherently not codable concepts without codable children: Secondary | ICD-10-CM

## 2013-02-03 DIAGNOSIS — J45909 Unspecified asthma, uncomplicated: Secondary | ICD-10-CM

## 2013-02-07 ENCOUNTER — Telehealth: Payer: Self-pay | Admitting: *Deleted

## 2013-02-07 NOTE — Telephone Encounter (Signed)
Lm on answering machine for daughter stephanie cox to call me.

## 2013-02-09 ENCOUNTER — Other Ambulatory Visit: Payer: Self-pay | Admitting: Internal Medicine

## 2013-02-16 ENCOUNTER — Ambulatory Visit: Payer: Medicare Other

## 2013-02-16 ENCOUNTER — Other Ambulatory Visit: Payer: Medicare Other

## 2013-02-16 ENCOUNTER — Ambulatory Visit: Payer: Medicare Other | Admitting: Oncology

## 2013-02-17 ENCOUNTER — Ambulatory Visit (HOSPITAL_BASED_OUTPATIENT_CLINIC_OR_DEPARTMENT_OTHER): Payer: Medicare Other

## 2013-02-17 ENCOUNTER — Other Ambulatory Visit (HOSPITAL_BASED_OUTPATIENT_CLINIC_OR_DEPARTMENT_OTHER): Payer: Medicare Other

## 2013-02-17 ENCOUNTER — Telehealth: Payer: Self-pay | Admitting: Oncology

## 2013-02-17 ENCOUNTER — Ambulatory Visit (HOSPITAL_BASED_OUTPATIENT_CLINIC_OR_DEPARTMENT_OTHER): Payer: Medicare Other | Admitting: Oncology

## 2013-02-17 ENCOUNTER — Encounter: Payer: Self-pay | Admitting: Oncology

## 2013-02-17 VITALS — BP 123/72 | HR 99 | Temp 97.9°F

## 2013-02-17 VITALS — BP 124/57 | HR 94 | Temp 98.3°F | Resp 19 | Ht 71.0 in | Wt 229.3 lb

## 2013-02-17 DIAGNOSIS — D649 Anemia, unspecified: Secondary | ICD-10-CM

## 2013-02-17 DIAGNOSIS — D61818 Other pancytopenia: Secondary | ICD-10-CM

## 2013-02-17 DIAGNOSIS — D469 Myelodysplastic syndrome, unspecified: Secondary | ICD-10-CM

## 2013-02-17 DIAGNOSIS — C8299 Follicular lymphoma, unspecified, extranodal and solid organ sites: Secondary | ICD-10-CM

## 2013-02-17 DIAGNOSIS — D472 Monoclonal gammopathy: Secondary | ICD-10-CM

## 2013-02-17 LAB — CBC WITH DIFFERENTIAL/PLATELET
BASO%: 0.5 % (ref 0.0–2.0)
Basophils Absolute: 0 10*3/uL (ref 0.0–0.1)
EOS%: 1.6 % (ref 0.0–7.0)
Eosinophils Absolute: 0 10*3/uL (ref 0.0–0.5)
HEMATOCRIT: 28.6 % — AB (ref 38.4–49.9)
HEMOGLOBIN: 9.8 g/dL — AB (ref 13.0–17.1)
LYMPH#: 1.1 10*3/uL (ref 0.9–3.3)
LYMPH%: 52.2 % — ABNORMAL HIGH (ref 14.0–49.0)
MCH: 35.8 pg — AB (ref 27.2–33.4)
MCHC: 34.4 g/dL (ref 32.0–36.0)
MCV: 103.8 fL — ABNORMAL HIGH (ref 79.3–98.0)
MONO#: 0 10*3/uL — AB (ref 0.1–0.9)
MONO%: 1.6 % (ref 0.0–14.0)
NEUT#: 0.9 10*3/uL — ABNORMAL LOW (ref 1.5–6.5)
NEUT%: 44.1 % (ref 39.0–75.0)
Platelets: 127 10*3/uL — ABNORMAL LOW (ref 140–400)
RBC: 2.75 10*6/uL — ABNORMAL LOW (ref 4.20–5.82)
RDW: 25.5 % — ABNORMAL HIGH (ref 11.0–14.6)
WBC: 2.1 10*3/uL — ABNORMAL LOW (ref 4.0–10.3)

## 2013-02-17 MED ORDER — DARBEPOETIN ALFA-POLYSORBATE 300 MCG/0.6ML IJ SOLN
300.0000 ug | Freq: Once | INTRAMUSCULAR | Status: AC
  Administered 2013-02-17: 300 ug via SUBCUTANEOUS
  Filled 2013-02-17: qty 0.6

## 2013-02-17 NOTE — Progress Notes (Signed)
Hematology and Oncology Follow Up Visit  [name] [id number] November 15, 1932 80 y.o. 02/17/2013 2:08 PM  CC: [name]. Victor Novas, MD, FCCP    Principle Diagnosis: A 80 year old gentleman with the following diagnoses:   1. Follicular lymphoma as a grade 3, stage III, diagnosed 2007, continued to be in remission. 2. Pancytopenia associated with mild neutropenia and macrocytosis possibly indicating early myelodysplasia. 3. Monoclonal gammopathy of undetermined significance.  Prior Therapy:  1. Status post CHOP and rituximab.  He had complete response to therapy concluded in 2007.  No further imaging has been indicated at this time. 2. Status post bone marrow biopsy in April 2011, did not really show any evidence of myelodysplasia or metastatic lymphoma at this time.  Current therapy: Aranesp 300 mcg every 3 weeks to keep his hemoglobin close to 11.  Interim History: [name] presents today for a followup visit.  Since his last visit, he was hospitalized for epistaxis. No further episodes noted since hospital D/C. He is reporting less fatigue and decline in his health since his last visit. He reports no chest pain but DOE. Had not reported any evidence of bleeding.  Had not had any infection. His activity level is less at this time. He does not report any dysphagia.  He does not report any lymphadenopathy. No night sweats. Overall performance status, activity level is about the same.   Medications: I have reviewed the patient's current medications Current Outpatient Prescriptions  Medication Sig Dispense Refill  . acetaminophen (TYLENOL) 500 MG tablet Take 500-1,000 mg by mouth every 6 (six) hours as needed for mild pain or headache.       . albuterol (PROVENTIL HFA;VENTOLIN HFA) 108 (90 BASE) MCG/ACT inhaler Inhale 2 puffs into the lungs every 4 (four) hours as needed for wheezing or shortness of breath.       [name] aspirin 81 MG tablet Take 81 mg by mouth at bedtime. (hold if bleeding)      .  calcium-vitamin D (OS-CAL 500 + D) 500-200 MG-UNIT per tablet Take 1 tablet by mouth 2 (two) times daily.       . cetirizine (ZYRTEC) 10 MG tablet Take 10 mg by mouth at bedtime as needed for allergies.      . Cholecalciferol (VITAMIN D) 1000 UNITS capsule Take 1,000 Units by mouth 2 (two) times daily.        [name] dextromethorphan-guaiFENesin (MUCINEX DM) 30-600 MG per 12 hr tablet Take 1 tablet by mouth every 12 (twelve) hours as needed (cough).       . famotidine (PEPCID) 20 MG tablet Take 20 mg by mouth at bedtime.       . finasteride (PROSCAR) 5 MG tablet Take 5 mg by mouth every morning.       . furosemide (LASIX) 40 MG tablet TAKE 1 TABLET BY MOUTH DAILY. MAY TAKE 1 EXTRA DAILY IF NEEDED FOR SWELLING IN THE LEGS  30 tablet  5  . hydrocortisone cream 1 % Apply 1 application topically 2 (two) times daily as needed (itchy rash).       [name] levothyroxine (SYNTHROID, LEVOTHROID) 25 MCG tablet Take 1 tablet (25 mcg total) by mouth daily before breakfast.  30 tablet  5  . metFORMIN (GLUCOPHAGE) 500 MG tablet Take 500 mg by mouth 2 (two) times daily with a meal.       . mometasone (NASONEX) 50 MCG/ACT nasal spray Place 2 sprays into the nose 2 (two) times daily.       [name]  mometasone-formoterol (DULERA) 200-5 MCG/ACT AERO Inhale 2 puffs into the lungs 2 (two) times daily.      . Multiple Vitamin (MULTIVITAMIN) tablet Take 1 tablet by mouth daily.        [name] omeprazole (PRILOSEC) 20 MG capsule Take 40 mg by mouth daily before breakfast.       . oxybutynin (DITROPAN-XL) 10 MG 24 hr tablet Take 10 mg by mouth daily.      . polyethylene glycol powder (MIRALAX) powder Take 17 g by mouth at bedtime.       . potassium chloride SA (K-DUR,KLOR-CON) 20 MEQ tablet Take 20 mEq by mouth daily.      . sulindac (CLINORIL) 200 MG tablet TAKE 1 TABLET BY MOUTH TWICE A DAY  60 tablet  5  . traMADol (ULTRAM) 50 MG tablet Take 1-2 tablets (50-100 mg total) by mouth every 6 (six) hours as needed.  60 tablet  0  . traZODone  (DESYREL) 50 MG tablet Take 50 mg by mouth at bedtime.       No current facility-administered medications for this visit.     Allergies: No Known Allergies  Past Medical History, Surgical history, Social history, and Family History were reviewed and updated.  Review of Systems:  Remaining ROS negative.  Physical Exam: Blood pressure 124/57, pulse 94, temperature 98.3 F (36.8 C), temperature source Oral, resp. rate 19, height _0  (1.803 m), weight 229 lb 4.8 oz (104.01 kg). ECOG: 1 General appearance: alert Head: Normocephalic, without obvious abnormality, atraumatic Neck: no adenopathy, no carotid bruit, no JVD, supple, symmetrical, trachea midline and thyroid not enlarged, symmetric, no tenderness/mass/nodules Lymph nodes: Cervical, supraclavicular, and axillary nodes normal. Heart:regular rate and rhythm, S1, S2 normal, no murmur, click, rub or gallop Lung:chest clear, no wheezing, rales, normal symmetric air entry Abdomen: soft, non-tender, without masses or organomegaly EXT:no erythema, induration, or nodules  Lab Results: Lab Results  Component Value Date   WBC 2.1* 02/17/2013   HGB 9.8* 02/17/2013   HCT 28.6* 02/17/2013   MCV 103.8* 02/17/2013   PLT 127* 02/17/2013     Chemistry      Component Value Date/Time   NA 133* 01/20/2013 0526   NA 137 01/05/2013 1501   K 4.2 01/20/2013 0526   K 3.8 01/05/2013 1501   CL 99 01/20/2013 0526   CL 99 06/08/2012 1257   CO2 26 01/20/2013 0526   CO2 26 01/05/2013 1501   BUN 14 01/20/2013 0526   BUN 13.9 01/05/2013 1501   CREATININE 0.71 01/20/2013 0526   CREATININE 0.8 01/05/2013 1501      Component Value Date/Time   CALCIUM 9.1 01/20/2013 0526   CALCIUM 10.1 01/05/2013 1501   ALKPHOS 82 12/06/2012 1850   ALKPHOS 48 08/10/2012 1117   AST 17 12/06/2012 1850   AST 17 08/10/2012 1117   ALT <5 12/06/2012 1850   ALT 14 08/10/2012 1117   BILITOT 0.7 12/06/2012 1850   BILITOT 0.82 08/10/2012 1117      Impression and  Plan:  This is a 80 year old gentleman with the following issues:  1. Follicular lymphoma diagnosed in 2007. He has no evidence to suggest recurrent disease. 2. Pancytopenia due to myelodysplastic syndrome is most likely etiology. He is status post bone marrow biopsy was done on 06/22/2012.  He is to continue on Aranesp 300 mcg every 3 weeks I will also set him up with packed red cell transfusion as needed. I see no need for any packed red cell transfusions at  this time. 3. Monoclonal gammopathy.  His M-spike had been less than 1 g/dL, his bone marrow biopsy continued to show a less than 8% plasma cells indicating most likely multiple myeloma.   4. Followup will be set up in 9 weeks.  Mikey Bussing 1/23/20152:08 PM

## 2013-02-17 NOTE — Telephone Encounter (Signed)
gv pt appt schedule for feb/march °

## 2013-03-06 ENCOUNTER — Ambulatory Visit (INDEPENDENT_AMBULATORY_CARE_PROVIDER_SITE_OTHER): Payer: Medicare Other | Admitting: Adult Health

## 2013-03-06 ENCOUNTER — Encounter: Payer: Self-pay | Admitting: Adult Health

## 2013-03-06 ENCOUNTER — Other Ambulatory Visit (INDEPENDENT_AMBULATORY_CARE_PROVIDER_SITE_OTHER): Payer: Medicare Other

## 2013-03-06 VITALS — BP 112/64 | HR 88 | Temp 97.7°F | Ht 70.0 in | Wt 230.4 lb

## 2013-03-06 DIAGNOSIS — R05 Cough: Secondary | ICD-10-CM

## 2013-03-06 DIAGNOSIS — E1365 Other specified diabetes mellitus with hyperglycemia: Secondary | ICD-10-CM

## 2013-03-06 DIAGNOSIS — E039 Hypothyroidism, unspecified: Secondary | ICD-10-CM

## 2013-03-06 DIAGNOSIS — IMO0002 Reserved for concepts with insufficient information to code with codable children: Secondary | ICD-10-CM

## 2013-03-06 DIAGNOSIS — R059 Cough, unspecified: Secondary | ICD-10-CM

## 2013-03-06 DIAGNOSIS — K5900 Constipation, unspecified: Secondary | ICD-10-CM

## 2013-03-06 DIAGNOSIS — J45909 Unspecified asthma, uncomplicated: Secondary | ICD-10-CM

## 2013-03-06 LAB — HEMOGLOBIN A1C: Hgb A1c MFr Bld: 5.7 % (ref 4.6–6.5)

## 2013-03-06 LAB — TSH: TSH: 2.89 u[IU]/mL (ref 0.35–5.50)

## 2013-03-06 NOTE — Assessment & Plan Note (Signed)
Advised on high fiber diet  Add stool softner daily  Cont on miralax  Patient's medications were reviewed today and patient education was given. Computerized medication calendar was adjusted/completed

## 2013-03-06 NOTE — Patient Instructions (Signed)
Stop maximum strength Mucinex .  May use Mucinex DM regular strength As needed  Cough/congestion  Clear skin daily with soap and water, pat dry, apply neosporin and bandage until healed.  Watch for signs of infection with redness or drainage  I will call with labs  Follow med calendar closely and bring to each visit. Please contact office for sooner follow up if symptoms do not improve or worsen or seek emergency care   follow up Dr. [name]  In 3 months and As needed

## 2013-03-06 NOTE — Assessment & Plan Note (Signed)
Cont on synthroid  Check tsh today

## 2013-03-06 NOTE — Progress Notes (Signed)
Subjective:     Patient ID: [name], male   DOB: Mar 06, 1932    MRN: [id number]  Brief patient profile:  22 yowm quit smoking 1970 with morbid obesity with boderline DM, Lymphoma s/p chemo ([facility]) and tendency to peripheral edema that is felt to be dependent and related to obesity/ venous insuff.   History of Present Illness  09/05/2012 acute  ov/Wert new problem = rash, breathing and cough resolved Chief Complaint  Patient presents with  . Acute Visit    Pt c/o rash and burning sensation left shoulder radiating down left arm- started to notice about a wk ago  initially though to be shingles but rash is actually bilateral upper arms and abd wall, itching rather than painful, no assoc fever or ha, arthralgias  >>refer to derm , vistaril rx   09/19/2012 Follow up  Returns for follow up for memory issues.  More forgetful. Family noticing trouble with memory as well.  MMSE today was 27/30, had difficulty with answers.  Clock draw was okay but took him a long time.  Had real trouble writing a sentence-he finally gave up.  No FH of Dementia per pt.  No headache, syncope, seizure act, visual/speech changes.  rec No change rx Neuro referral     09/30/2012 acute  ov/Wert re "abd pain" Chief Complaint  Patient presents with  . Acute Visit    Pt c/o low abd pain and right side pain x 2 days. He also states urine has been dark in color for the past 3 days.   has not taken any pain med, no rigors, no n or v, pain worse across both sides of back when bends over. Remembers "missing a step" prior to onset of back pain which forms a c around upper abd but is equal on both sides. No sob.  No change chronic cough or worse pain with cough rec As per calendar, take the tramadol 50 mg up to every 4 hours as needed for pain   Admit date: 10/27/2012  Discharge date: 11/08/2012  Primary Care Physician: Christinia Gully, MD  Final Discharge Diagnoses:  Staph aureus bacteremia/sepsis of unclear  etiology, likely with assumption that the pacemaker is contaminated  Secondary diagnosis . acute encephalopathy . Abdominal pain . Bacteremia due to Staphylococcus aureus . generalized debility  . Other pancytopenia . Hyponatremia . LYMPHOMA NEC, MLIG, INGUINAL/LOWER LIMB . Essential hypertension, benign . GERD . DM (diabetes mellitus), secondary uncontrolled    10/30 /2014 post hosp f/u ov/Wert re: asthma/ dm/ sp staph bacteremia Chief Complaint  Patient presents with  . Follow-up    Pt states recently d/ced from [facility] on 11/08/12- feeling fatigued since then.    In the process of completing 4 weeks IV abx with removal of permanent pacemaker f/u per Dr Lovena Le Not limited by breathing, rarely needing saba, no sign  cough. Fasting sugars in the 120-140 range. >>no changes  01/02/13 [facility] follow up  Admitted originally 12/06/2012 -12/11/12 then readmitted 12/4-5 . Admitted with  MSSA bacteremia secondary to infected pacemaker-explanted . Tx w/ prolonged abx with ID consult . Stay complicated by  Pancytopenia had 2 units PRBC transfusion . - Follows with Dr. [name] of the oncology service.  Had volume overload.  Improved with aggressive diuresis.  . The patient lost over 10 kg and 13L during his hospitalization. The patient's lower extremity edema improved. The patient will be discharged home to finish his cefazolin with his last dose on 12/16/2012. After initial  improvement of the patient's sodium, his sodium trended back down. Nephrology was consulted. They agreed with current management. In light of the patient's worsened serum sodium, they recommended starting Synthroid based upon the patient's thyroid function studies. The patient will be sent home with Synthroid 25 mcg daily.   Says  doing "great" since discharge.  no new complaints.  does have medication questions concerning Lexapro replacement, Trazodone vs Ambien, Levothyroxine  Pt needs tsh check now on  levothyroxine. Lexapro was stopped due to low sodium. Denies depression flare.  Can not take ambien. Does take 1/3 of trazodone.  meds reviewed with updated list.  rec Continue on current regimen.  Has a TSH done later this week when you have labs done at oncology> never done    01/23/2013 f/u ov/Wert re: weakness Chief Complaint  Patient presents with  . Follow-up    Pt c/o SOB with exertion.  Sinuses are packed due to nosebleed since christmas eve.      >>no changes   03/06/2013 Follow up and Med review  Patient returns for followup and medication review. We reviewed all his medications. Organized them into a medication calendar with patient education. It appears that he is taking his medications correctly.  Is undergoing PT , feels like it is helping. Has a suture in left chest wall left over from previous pacemaker -wants it removed. No fever or drainage.  Complains that he has had some increased constipation over the last few weeks. He takes MiraLAX daily. Does have a stool softener, but has not been using it. Has also recently switched Mucinex to maximum strength. He denies any chest pain, abdominal pain, bloody stools, fever, or urinary issues. Has had a slightly increased cough with clear mucus, over last several weeks. This seems to be somewhat chronic and comes and goes. He denies any fever or discolored mucus.  Current Medications, Allergies, Complete Past Medical History, Past Surgical History, Family History, and Social History were reviewed in [employer] record.  ROS  The following are not active complaints unless bolded sore throat, dysphagia, dental problems, itching, sneezing,  nasal congestion or excess/ purulent secretions, ear ache,   fever, chills, sweats, unintended wt loss, pleuritic or exertional cp, hemoptysis,  orthopnea pnd or leg swelling, presyncope, palpitations, heartburn, abdominal pain, anorexia, nausea, vomiting, diarrhea  or change in  bowel or urinary habits, change in stools or urine, dysuria,hematuria,  rash, arthralgias, visual complaints, headache, numbness weakness or ataxia or problems with walking or coordination,  change in mood/affect or memory.                      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   [facility]  - MRI 11/23/2007 c/w L1-2 bulging disc indents the thecal sac with foraminal stenosis  HEALTH MAINTENANCE..........................................................................................[facility]  - Td 10/2005  - Pneumovax 10/07 second shot  - CPX 08/20/2010  --colon 02/2005 -int/extern. hems (repeat q7y)  Complex med regimen  --Meds reviewed  with pt education and computerized med calendar completed/adjusted. November 08, 2008 , August 21, 2009 updated 02/19/2011 , 10/23/2011  Syncope...........................................................................................................[facility]och[facility]  - Mobitz II AV block s/p Medtronic pacemaker placement 12/11/2008  [facility].....................................................................................................[facility]  - Pruritic rash 04/2009 > tried lotrisone, [facility] to [facility] August 06, 2009  Memory impairment , MMSE 27/30 8/25>refer to neuro   Family History:   mother had diabetes   Social History:  Patient  states former smoker.  quit smoking in 1970  No ETOH  Retired            Objective:   Physical Exam wt 244 January 16, 2008 > 249 April 24, 2009 > 249 10/06/2010 >  11/27/2010  245 > 01/07/2011 241 > 02/19/11 238 > 06/04/2011  247 >07/03/2011 246 > 07/31/2011  248 > 09/11/2011  247 >246 10/23/2011 > 258 02/15/2012 > 05/19/2012  258 > 08/19/2012 255 >  266 09/05/12>  11/24/12 256>235 01/02/13 > 237  01/23/2013  237>>230 03/06/2013  W/c bound obese wm in no acute distress but seems overall much more frail  HEENT: nl dentition, turbinates, and orophanx.  Mucus membranes pink and moist. No tongue or throat exudate.  Neck: supple with full ROM. no JVD, node enlargement or TMJ   Lungs: clear and equal bilateral breath sounds. No wheezing, rhonchi or rales  Chest: RRR. No murmurs, rubs, or gallops.  Along left chest wall , small suture noted, removed with out difficulty, area cleaned and dressed, no drainage or redness noted.  Tr- peripheral edema.  Abd: obese but soft and no rebound or focal tenderness. Normal excursion in the supine position.  Ext warm and dry, no calf tenderness, cyanosis or clubbing. mild/mod chronic venous insufficiency changes. Varicose veins present. Neuro: nl sensorium and gait..  Skin:   No rash    12/30/12 cxr Left-sided pacemaker has been removed.  Interval placement of dual lead pacemaker from right-sided approach.  Right atrial and right ventricular leads in good position. No  pneumothorax  Cardiac enlargement without heart failure infiltrate or effusion.   Assessment:

## 2013-03-06 NOTE — Assessment & Plan Note (Signed)
Advised to change to mucinex DM , avoid maximum strength as more side effects  Call if cough worsens or has discolored mucus.

## 2013-03-06 NOTE — Assessment & Plan Note (Signed)
Compensated on present regimen  No flare noted.

## 2013-03-08 ENCOUNTER — Telehealth: Payer: Self-pay | Admitting: Internal Medicine

## 2013-03-08 NOTE — Telephone Encounter (Signed)
Notes Recorded by Tanda Rockers, MD on 03/06/2013 at 7:18 PM Call patient : Study is unremarkable, no change in recs   Notes Recorded by Melvenia Needles, NP on 03/07/2013 at 9:11 AM Thyroid fxn is improved and under good control   Pt advised . [place] Bing, CMA

## 2013-03-10 ENCOUNTER — Other Ambulatory Visit (HOSPITAL_BASED_OUTPATIENT_CLINIC_OR_DEPARTMENT_OTHER): Payer: Medicare Other

## 2013-03-10 ENCOUNTER — Ambulatory Visit (HOSPITAL_BASED_OUTPATIENT_CLINIC_OR_DEPARTMENT_OTHER): Payer: Medicare Other

## 2013-03-10 VITALS — BP 137/72 | HR 99 | Temp 97.6°F

## 2013-03-10 DIAGNOSIS — D649 Anemia, unspecified: Secondary | ICD-10-CM

## 2013-03-10 DIAGNOSIS — D61818 Other pancytopenia: Secondary | ICD-10-CM

## 2013-03-10 DIAGNOSIS — D469 Myelodysplastic syndrome, unspecified: Secondary | ICD-10-CM

## 2013-03-10 LAB — CBC WITH DIFFERENTIAL/PLATELET
BASO%: 0.4 % (ref 0.0–2.0)
BASOS ABS: 0 10*3/uL (ref 0.0–0.1)
EOS%: 1.5 % (ref 0.0–7.0)
Eosinophils Absolute: 0 10*3/uL (ref 0.0–0.5)
HCT: 27.5 % — ABNORMAL LOW (ref 38.4–49.9)
HGB: 9.3 g/dL — ABNORMAL LOW (ref 13.0–17.1)
LYMPH%: 53.2 % — AB (ref 14.0–49.0)
MCH: 37.4 pg — AB (ref 27.2–33.4)
MCHC: 33.9 g/dL (ref 32.0–36.0)
MCV: 110.3 fL — AB (ref 79.3–98.0)
MONO#: 0 10*3/uL — ABNORMAL LOW (ref 0.1–0.9)
MONO%: 1.9 % (ref 0.0–14.0)
NEUT#: 0.8 10*3/uL — ABNORMAL LOW (ref 1.5–6.5)
NEUT%: 43 % (ref 39.0–75.0)
Platelets: 106 10*3/uL — ABNORMAL LOW (ref 140–400)
RBC: 2.49 10*6/uL — AB (ref 4.20–5.82)
RDW: 25.3 % — AB (ref 11.0–14.6)
WBC: 1.8 10*3/uL — ABNORMAL LOW (ref 4.0–10.3)
lymph#: 0.9 10*3/uL (ref 0.9–3.3)

## 2013-03-10 MED ORDER — DARBEPOETIN ALFA-POLYSORBATE 300 MCG/0.6ML IJ SOLN
300.0000 ug | Freq: Once | INTRAMUSCULAR | Status: AC
  Administered 2013-03-10: 300 ug via SUBCUTANEOUS
  Filled 2013-03-10: qty 0.6

## 2013-03-10 NOTE — Addendum Note (Signed)
Addended by: Parke Poisson E on: 03/10/2013 10:02 AM   Modules accepted: Orders

## 2013-03-21 ENCOUNTER — Other Ambulatory Visit: Payer: Self-pay | Admitting: Internal Medicine

## 2013-03-29 ENCOUNTER — Other Ambulatory Visit: Payer: Self-pay | Admitting: Internal Medicine

## 2013-03-31 ENCOUNTER — Other Ambulatory Visit (HOSPITAL_BASED_OUTPATIENT_CLINIC_OR_DEPARTMENT_OTHER): Payer: Medicare Other

## 2013-03-31 ENCOUNTER — Ambulatory Visit (HOSPITAL_BASED_OUTPATIENT_CLINIC_OR_DEPARTMENT_OTHER): Payer: Medicare Other

## 2013-03-31 VITALS — BP 131/65 | HR 89 | Temp 97.7°F

## 2013-03-31 DIAGNOSIS — D649 Anemia, unspecified: Secondary | ICD-10-CM

## 2013-03-31 DIAGNOSIS — E039 Hypothyroidism, unspecified: Secondary | ICD-10-CM

## 2013-03-31 DIAGNOSIS — D61818 Other pancytopenia: Secondary | ICD-10-CM

## 2013-03-31 DIAGNOSIS — D469 Myelodysplastic syndrome, unspecified: Secondary | ICD-10-CM

## 2013-03-31 LAB — CBC WITH DIFFERENTIAL/PLATELET
BASO%: 0.3 % (ref 0.0–2.0)
BASOS ABS: 0 10*3/uL (ref 0.0–0.1)
EOS%: 1 % (ref 0.0–7.0)
Eosinophils Absolute: 0 10*3/uL (ref 0.0–0.5)
HCT: 24.7 % — ABNORMAL LOW (ref 38.4–49.9)
HEMOGLOBIN: 8.4 g/dL — AB (ref 13.0–17.1)
LYMPH%: 56.3 % — AB (ref 14.0–49.0)
MCH: 38.8 pg — AB (ref 27.2–33.4)
MCHC: 34.1 g/dL (ref 32.0–36.0)
MCV: 113.9 fL — AB (ref 79.3–98.0)
MONO#: 0 10*3/uL — ABNORMAL LOW (ref 0.1–0.9)
MONO%: 1.3 % (ref 0.0–14.0)
NEUT%: 41.1 % (ref 39.0–75.0)
NEUTROS ABS: 0.7 10*3/uL — AB (ref 1.5–6.5)
Platelets: 100 10*3/uL — ABNORMAL LOW (ref 140–400)
RBC: 2.17 10*6/uL — ABNORMAL LOW (ref 4.20–5.82)
RDW: 22.3 % — ABNORMAL HIGH (ref 11.0–14.6)
WBC: 1.7 10*3/uL — ABNORMAL LOW (ref 4.0–10.3)
lymph#: 0.9 10*3/uL (ref 0.9–3.3)

## 2013-03-31 MED ORDER — DARBEPOETIN ALFA-POLYSORBATE 300 MCG/0.6ML IJ SOLN
300.0000 ug | Freq: Once | INTRAMUSCULAR | Status: AC
  Administered 2013-03-31: 300 ug via SUBCUTANEOUS
  Filled 2013-03-31: qty 0.6

## 2013-04-06 ENCOUNTER — Ambulatory Visit (INDEPENDENT_AMBULATORY_CARE_PROVIDER_SITE_OTHER): Payer: Medicare Other | Admitting: Internal Medicine

## 2013-04-06 ENCOUNTER — Encounter: Payer: Self-pay | Admitting: Internal Medicine

## 2013-04-06 VITALS — BP 114/72 | HR 89 | Ht 71.0 in | Wt 232.0 lb

## 2013-04-06 DIAGNOSIS — I441 Atrioventricular block, second degree: Secondary | ICD-10-CM

## 2013-04-06 DIAGNOSIS — I10 Essential (primary) hypertension: Secondary | ICD-10-CM

## 2013-04-06 DIAGNOSIS — I442 Atrioventricular block, complete: Secondary | ICD-10-CM

## 2013-04-06 DIAGNOSIS — I4891 Unspecified atrial fibrillation: Secondary | ICD-10-CM

## 2013-04-06 NOTE — Patient Instructions (Addendum)
Remote monitoring is used to monitor your Pacemaker or ICD from home. This monitoring reduces the number of office visits required to check your device to one time per year. It allows us to keep an eye on the functioning of your device to ensure it is working properly. You are scheduled for a device check from home on July 06, 2013. You may send your transmission at any time that day. If you have a wireless device, the transmission will be sent automatically. After your physician reviews your transmission, you will receive a postcard with your next transmission date.  Your physician wants you to follow-up in: 1 year with Dr Allred.  You will receive a reminder letter in the mail two months in advance. If you don't receive a letter, please call our office to schedule the follow-up appointment.  

## 2013-04-06 NOTE — Progress Notes (Signed)
PCP: Christinia Gully, MD  [name] is a 80 y.o. male who presents today for routine electrophysiology followup.  Since pacemaker system removal and re-implantation 12/2012, the patient reports doing very well.  He is primarily limited by symptoms of fatigue and SOB related to his anemia/ pancytopenia.  He denies fevers or chills.  He has occasional postural dizziness.  Today, he denies symptoms of palpitations, chest pain, shortness of breath,  lower extremity edema, presyncope, or syncope.  The patient is otherwise without complaint today.   Past Medical History  Diagnosis Date  . Allergic rhinitis   . Hyperlipidemia   . Exogenous obesity   . Edema     venous insufficiency  . Second degree Mobitz II AV block 12/11/08    with transient syncope, resolved s/p PPM  . Paroxysmal atrial fibrillation 03/16/11    diagnosed by PPM interrogation  . Pancytopenia   . Postural dizziness   . CHF (congestive heart failure)   . Pacemaker   . Dyspnea     "all the time w/the temporary pacer" (12/29/2012)  . Type II diabetes mellitus 2012  . History of blood transfusion     "3 recently" (12/29/2012)  . Daily headache     "recently w/the ATB" (12/29/2012)  . Arthritis     "hips; knees" (12/29/2012)  . 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    Past Surgical History  Procedure Laterality Date  . Lymph node biopsy  04/2005    groin  . Sternotomy  2000  . Pericardiectomy  2000  . Penile prosthesis implant      and removal  . Pacemaker insertion  12/11/08    MDT implanted by Dr Rayann Heman  . Tee without cardioversion N/A 10/31/2012    Procedure: TRANSESOPHAGEAL ECHOCARDIOGRAM (TEE);  Surgeon: Lelon Perla, MD;  Location: [facility] ENDOSCOPY;  Service: Cardiovascular;  Laterality: N/A;  . Pacemaker lead removal Left 11/04/2012    Procedure: PACEMAKER LEAD REMOVAL;  Surgeon: Evans Lance, MD;  Location: [place];  Service: Cardiovascular;  Laterality:  Left;  . Temporary pacemaker insertion Left 11/04/2012    Procedure: TEMPORARY PACEMAKER INSERTION;  Surgeon: Evans Lance, MD;  Location: [place];  Service: Cardiovascular;  Laterality: Left;  . Insert / replace / remove pacemaker  12/29/2012    Current Outpatient Prescriptions  Medication Sig Dispense Refill  . acetaminophen (TYLENOL) 500 MG tablet 1-2 every 4-6 hours as needed for headache.      . albuterol (PROVENTIL HFA;VENTOLIN HFA) 108 (90 BASE) MCG/ACT inhaler 1-2 puffs every 4-6 hours as needed for wheezing/shortness of breath      . aspirin 81 MG tablet Take 81 mg by mouth every morning. (hold if bleeding)      . calcium-vitamin D (OS-CAL 500 + D) 500-200 MG-UNIT per tablet Take 1 tablet by mouth 2 (two) times daily.       . cetirizine (ZYRTEC) 10 MG tablet Take 10 mg by mouth at bedtime as needed for allergies.      . Cholecalciferol (VITAMIN D) 1000 UNITS capsule Take 1,000 Units by mouth 2 (two) times daily.        [name] dextromethorphan-guaiFENesin (MUCINEX DM) 30-600 MG per 12 hr tablet Take 1-2 tablets by mouth every 12 (twelve) hours as needed for cough.       . docusate sodium (COLACE) 100 MG capsule Take 100 mg by mouth at bedtime.      [name]  famotidine (PEPCID) 20 MG tablet Take 20 mg by mouth at bedtime.       . finasteride (PROSCAR) 5 MG tablet Take 5 mg by mouth every morning.       . furosemide (LASIX) 40 MG tablet TAKE 1 TABLET BY MOUTH DAILY. MAY TAKE 1 EXTRA DAILY IF NEEDED FOR SWELLING IN THE LEGS  30 tablet  5  . hydrocortisone cream 1 % Apply 1 application topically 2 (two) times daily as needed (itchy rash).       . hydrOXYzine (ATARAX/VISTARIL) 25 MG tablet 1-2 tabs by mouth every 4-6 hours as needed for itching      . levothyroxine (SYNTHROID, LEVOTHROID) 25 MCG tablet Take 1 tablet (25 mcg total) by mouth daily before breakfast.  30 tablet  5  . metFORMIN (GLUCOPHAGE) 500 MG tablet Take 500 mg by mouth 2 (two) times daily with a meal.       . methylcellulose (CITRUCEL)  oral powder Take 1 packet by mouth at bedtime.      . mometasone (NASONEX) 50 MCG/ACT nasal spray 0-2 puffs each nostril in the morning, 1-2 puffs at bedtime      . mometasone-formoterol (DULERA) 200-5 MCG/ACT AERO Inhale 2 puffs into the lungs 2 (two) times daily.      . Multiple Vitamin (MULTIVITAMIN) tablet Take 1 tablet by mouth daily.        [name] omeprazole (PRILOSEC) 20 MG capsule 2 capsules by mouth every morning before breakfast      . oxybutynin (DITROPAN-XL) 10 MG 24 hr tablet Take 10 mg by mouth daily.      . polyethylene glycol powder (MIRALAX) powder Take 17 g by mouth at bedtime.       . potassium chloride SA (K-DUR,KLOR-CON) 20 MEQ tablet Take 20 mEq by mouth daily.      . sulindac (CLINORIL) 200 MG tablet TAKE 1 TABLET BY MOUTH TWICE A DAY AS NEEDED FOR ARTHRITIS PAIN      . traMADol (ULTRAM) 50 MG tablet Add 1 every 4 hours as needed for severe cough or pain      . traZODone (DESYREL) 150 MG tablet Take 1/3 tablet by mouth at bedtime       No current facility-administered medications for this visit.    Physical Exam: Filed Vitals:   04/06/13 1025  BP: 114/72  Pulse: 89  Height: 5\' 11"  (1.803 m)  Weight: 232 lb (105.235 kg)    GEN- The patient is well appearing, alert and oriented x 3 today.   Head- normocephalic, atraumatic Eyes-  Sclera clear, conjunctiva pink Ears- hearing intact Oropharynx- clear Lungs- Clear to ausculation bilaterally, normal work of breathing Chest- pacemaker pocket is well healed Heart- Regular rate and rhythm, no murmurs, rubs or gallops, PMI not laterally displaced GI- soft, NT, ND, + BS Extremities- no clubbing, cyanosis, trace edema  Pacemaker interrogation- reviewed in detail today,  See PACEART report  Assessment and Plan:  1. Complete heart block Normal pacemaker function See Pace Art report No changes today  2. afib Not a candidate for oral anticoagulation due to pancytopenia May need to stop aspirin if anemia worsens  3.  htn Stable No change required today  carelink Return in 1 year

## 2013-04-09 LAB — MDC_IDC_ENUM_SESS_TYPE_INCLINIC
Lead Channel Pacing Threshold Amplitude: 0.625 V
Lead Channel Pacing Threshold Amplitude: 1.25 V
Lead Channel Pacing Threshold Pulse Width: 0.4 ms
Lead Channel Pacing Threshold Pulse Width: 0.4 ms
Lead Channel Sensing Intrinsic Amplitude: 2.8 mV
Lead Channel Setting Pacing Pulse Width: 0.4 ms
Lead Channel Setting Sensing Sensitivity: 4 mV
MDC IDC MSMT BATTERY VOLTAGE: 2.78 V
MDC IDC MSMT LEADCHNL RA IMPEDANCE VALUE: 461 Ohm
MDC IDC MSMT LEADCHNL RV IMPEDANCE VALUE: 465 Ohm
MDC IDC SET LEADCHNL RA PACING AMPLITUDE: 3.5 V
MDC IDC SET LEADCHNL RV PACING AMPLITUDE: 3.5 V
MDC IDC STAT BRADY AP VP PERCENT: 0.9 %
MDC IDC STAT BRADY AP VS PERCENT: 0.1 %
MDC IDC STAT BRADY AS VP PERCENT: 98.3 %
MDC IDC STAT BRADY AS VS PERCENT: 0.8 %

## 2013-04-12 ENCOUNTER — Other Ambulatory Visit: Payer: Self-pay | Admitting: Medical Oncology

## 2013-04-12 ENCOUNTER — Telehealth: Payer: Self-pay | Admitting: Oncology

## 2013-04-12 ENCOUNTER — Ambulatory Visit (HOSPITAL_COMMUNITY)
Admission: RE | Admit: 2013-04-12 | Discharge: 2013-04-12 | Disposition: A | Discharge status: Home / Self Care | Payer: Medicare Other | Source: Ambulatory Visit | Attending: Oncology | Admitting: Oncology

## 2013-04-12 DIAGNOSIS — C8595 Non-Hodgkin lymphoma, unspecified, lymph nodes of inguinal region and lower limb: Secondary | ICD-10-CM

## 2013-04-12 DIAGNOSIS — D61818 Other pancytopenia: Secondary | ICD-10-CM | POA: Insufficient documentation

## 2013-04-12 NOTE — Telephone Encounter (Signed)
, °

## 2013-04-12 NOTE — Progress Notes (Signed)
Late entry: Daughter, [name], called office yesterday stating patient weak, SOB and pale, thinks he may need blood. Reviewed with MD, VO received to place orders. Called daughter back twice to set up appt for [facility] and transfusion, d/t no availability in transfusion room today, but was only able to leave VM. Call this morning @ 0830 to daughter, daughter states patient wishes to wait to come cancer center and have labs and transfusion done tomorrow (Thrusday). POF sent. Orders placed. Daughter aware to expect call from scheduling for appts.

## 2013-04-13 ENCOUNTER — Ambulatory Visit (HOSPITAL_BASED_OUTPATIENT_CLINIC_OR_DEPARTMENT_OTHER): Payer: Medicare Other

## 2013-04-13 ENCOUNTER — Encounter: Payer: Self-pay | Admitting: Medical Oncology

## 2013-04-13 ENCOUNTER — Other Ambulatory Visit (HOSPITAL_BASED_OUTPATIENT_CLINIC_OR_DEPARTMENT_OTHER): Payer: Medicare Other

## 2013-04-13 VITALS — BP 104/66 | HR 75 | Temp 97.4°F | Resp 18

## 2013-04-13 DIAGNOSIS — D649 Anemia, unspecified: Secondary | ICD-10-CM

## 2013-04-13 DIAGNOSIS — C8595 Non-Hodgkin lymphoma, unspecified, lymph nodes of inguinal region and lower limb: Secondary | ICD-10-CM

## 2013-04-13 DIAGNOSIS — D61818 Other pancytopenia: Secondary | ICD-10-CM

## 2013-04-13 LAB — PREPARE RBC (CROSSMATCH)

## 2013-04-13 LAB — CBC WITH DIFFERENTIAL/PLATELET
BASO%: 0 % (ref 0.0–2.0)
Basophils Absolute: 0 10*3/uL (ref 0.0–0.1)
EOS%: 1.3 % (ref 0.0–7.0)
Eosinophils Absolute: 0 10*3/uL (ref 0.0–0.5)
HCT: 24.8 % — ABNORMAL LOW (ref 38.4–49.9)
HGB: 8.6 g/dL — ABNORMAL LOW (ref 13.0–17.1)
LYMPH#: 1 10*3/uL (ref 0.9–3.3)
LYMPH%: 64.2 % — ABNORMAL HIGH (ref 14.0–49.0)
MCH: 38.6 pg — AB (ref 27.2–33.4)
MCHC: 34.7 g/dL (ref 32.0–36.0)
MCV: 111.2 fL — ABNORMAL HIGH (ref 79.3–98.0)
MONO#: 0 10*3/uL — ABNORMAL LOW (ref 0.1–0.9)
MONO%: 0.6 % (ref 0.0–14.0)
NEUT#: 0.5 10*3/uL — CL (ref 1.5–6.5)
NEUT%: 33.9 % — AB (ref 39.0–75.0)
Platelets: 91 10*3/uL — ABNORMAL LOW (ref 140–400)
RBC: 2.23 10*6/uL — ABNORMAL LOW (ref 4.20–5.82)
RDW: 17.9 % — AB (ref 11.0–14.6)
WBC: 1.6 10*3/uL — ABNORMAL LOW (ref 4.0–10.3)
nRBC: 0 % (ref 0–0)

## 2013-04-13 MED ORDER — ACETAMINOPHEN 325 MG PO TABS
ORAL_TABLET | ORAL | Status: AC
  Filled 2013-04-13: qty 2

## 2013-04-13 MED ORDER — SODIUM CHLORIDE 0.9 % IV SOLN
250.0000 mL | Freq: Once | INTRAVENOUS | Status: AC
  Administered 2013-04-13: 250 mL via INTRAVENOUS

## 2013-04-13 MED ORDER — DIPHENHYDRAMINE HCL 25 MG PO CAPS
25.0000 mg | ORAL_CAPSULE | Freq: Once | ORAL | Status: AC
  Administered 2013-04-13: 25 mg via ORAL

## 2013-04-13 MED ORDER — ACETAMINOPHEN 325 MG PO TABS
650.0000 mg | ORAL_TABLET | Freq: Once | ORAL | Status: AC
Start: 2013-04-13 — End: 2013-04-13
  Administered 2013-04-13: 650 mg via ORAL

## 2013-04-13 MED ORDER — DIPHENHYDRAMINE HCL 25 MG PO CAPS
ORAL_CAPSULE | ORAL | Status: AC
  Filled 2013-04-13: qty 1

## 2013-04-13 NOTE — Patient Instructions (Signed)
Blood Transfusion Information WHAT IS A BLOOD TRANSFUSION? A transfusion is the replacement of blood or some of its parts. Blood is made up of multiple cells which provide different functions.  Red blood cells carry oxygen and are used for blood loss replacement.  White blood cells fight against infection.  Platelets control bleeding.  Plasma helps clot blood.  Other blood products are available for specialized needs, such as hemophilia or other clotting disorders. BEFORE THE TRANSFUSION  Who gives blood for transfusions?   You may be able to donate blood to be used at a later date on yourself (autologous donation).  Relatives can be asked to donate blood. This is generally not any safer than if you have received blood from a stranger. The same precautions are taken to ensure safety when a relative's blood is donated.  Healthy volunteers who are fully evaluated to make sure their blood is safe. This is blood bank blood. Transfusion therapy is the safest it has ever been in the practice of medicine. Before blood is taken from a donor, a complete history is taken to make sure that person has no history of diseases nor engages in risky social behavior (examples are intravenous drug use or sexual activity with multiple partners). The donor's travel history is screened to minimize risk of transmitting infections, such as malaria. The donated blood is tested for signs of infectious diseases, such as HIV and hepatitis. The blood is then tested to be sure it is compatible with you in order to minimize the chance of a transfusion reaction. If you or a relative donates blood, this is often done in anticipation of surgery and is not appropriate for emergency situations. It takes many days to process the donated blood. RISKS AND COMPLICATIONS Although transfusion therapy is very safe and saves many lives, the main dangers of transfusion include:   Getting an infectious disease.  Developing a  transfusion reaction. This is an allergic reaction to something in the blood you were given. Every precaution is taken to prevent this. The decision to have a blood transfusion has been considered carefully by your caregiver before blood is given. Blood is not given unless the benefits outweigh the risks. AFTER THE TRANSFUSION  Right after receiving a blood transfusion, you will usually feel much better and more energetic. This is especially true if your red blood cells have gotten low (anemic). The transfusion raises the level of the red blood cells which carry oxygen, and this usually causes an energy increase.  The nurse administering the transfusion will monitor you carefully for complications. HOME CARE INSTRUCTIONS  No special instructions are needed after a transfusion. You may find your energy is better. Speak with your caregiver about any limitations on activity for underlying diseases you may have. SEEK MEDICAL CARE IF:   Your condition is not improving after your transfusion.  You develop redness or irritation at the intravenous (IV) site. SEEK IMMEDIATE MEDICAL CARE IF:  Any of the following symptoms occur over the next 12 hours:  Shaking chills.  You have a temperature by mouth above 102 F (38.9 C), not controlled by medicine.  Chest, back, or muscle pain.  People around you feel you are not acting correctly or are confused.  Shortness of breath or difficulty breathing.  Dizziness and fainting.  You get a rash or develop hives.  You have a decrease in urine output.  Your urine turns a dark color or changes to pink, red, or brown. Any of the following   symptoms occur over the next 10 days:  You have a temperature by mouth above 102 F (38.9 C), not controlled by medicine.  Shortness of breath.  Weakness after normal activity.  The white part of the eye turns yellow (jaundice).  You have a decrease in the amount of urine or are urinating less often.  Your  urine turns a dark color or changes to pink, red, or brown. Document Released: 01/10/2000 Document Revised: 04/06/2011 Document Reviewed: 08/29/2007 ExitCare Patient Information 2014 [employer].  

## 2013-04-13 NOTE — Progress Notes (Signed)
Pt's lab result with Hgb @ 8.6, spoke with patient, he states feeling weak and stumbling. Reviewed with MD, patient to receive 1 unit of PRBC's today. Treatment room informed and patient aware.

## 2013-04-14 LAB — TYPE AND SCREEN
ABO/RH(D): A POS
ANTIBODY SCREEN: NEGATIVE
Unit division: 0

## 2013-04-21 ENCOUNTER — Encounter: Payer: Self-pay | Admitting: Oncology

## 2013-04-21 ENCOUNTER — Ambulatory Visit (HOSPITAL_BASED_OUTPATIENT_CLINIC_OR_DEPARTMENT_OTHER): Payer: Medicare Other | Admitting: Oncology

## 2013-04-21 ENCOUNTER — Telehealth: Payer: Self-pay | Admitting: Oncology

## 2013-04-21 ENCOUNTER — Ambulatory Visit (HOSPITAL_BASED_OUTPATIENT_CLINIC_OR_DEPARTMENT_OTHER): Payer: Medicare Other

## 2013-04-21 ENCOUNTER — Other Ambulatory Visit (HOSPITAL_BASED_OUTPATIENT_CLINIC_OR_DEPARTMENT_OTHER): Payer: Medicare Other

## 2013-04-21 VITALS — BP 140/68 | HR 97 | Temp 98.0°F | Resp 18 | Ht 71.0 in | Wt 232.8 lb

## 2013-04-21 DIAGNOSIS — D649 Anemia, unspecified: Secondary | ICD-10-CM

## 2013-04-21 DIAGNOSIS — C8299 Follicular lymphoma, unspecified, extranodal and solid organ sites: Secondary | ICD-10-CM

## 2013-04-21 DIAGNOSIS — D61818 Other pancytopenia: Secondary | ICD-10-CM

## 2013-04-21 DIAGNOSIS — D469 Myelodysplastic syndrome, unspecified: Secondary | ICD-10-CM

## 2013-04-21 DIAGNOSIS — D472 Monoclonal gammopathy: Secondary | ICD-10-CM

## 2013-04-21 LAB — CBC WITH DIFFERENTIAL/PLATELET
BASO%: 0.2 % (ref 0.0–2.0)
Basophils Absolute: 0 10*3/uL (ref 0.0–0.1)
EOS%: 1.1 % (ref 0.0–7.0)
Eosinophils Absolute: 0 10*3/uL (ref 0.0–0.5)
HEMATOCRIT: 27 % — AB (ref 38.4–49.9)
HEMOGLOBIN: 9.2 g/dL — AB (ref 13.0–17.1)
LYMPH#: 0.8 10*3/uL — AB (ref 0.9–3.3)
LYMPH%: 53 % — ABNORMAL HIGH (ref 14.0–49.0)
MCH: 38.1 pg — ABNORMAL HIGH (ref 27.2–33.4)
MCHC: 33.9 g/dL (ref 32.0–36.0)
MCV: 112.4 fL — ABNORMAL HIGH (ref 79.3–98.0)
MONO#: 0 10*3/uL — AB (ref 0.1–0.9)
MONO%: 1.5 % (ref 0.0–14.0)
NEUT%: 44.2 % (ref 39.0–75.0)
NEUTROS ABS: 0.7 10*3/uL — AB (ref 1.5–6.5)
Platelets: 96 10*3/uL — ABNORMAL LOW (ref 140–400)
RBC: 2.4 10*6/uL — ABNORMAL LOW (ref 4.20–5.82)
RDW: 23.2 % — ABNORMAL HIGH (ref 11.0–14.6)
WBC: 1.6 10*3/uL — AB (ref 4.0–10.3)

## 2013-04-21 MED ORDER — DARBEPOETIN ALFA-POLYSORBATE 300 MCG/0.6ML IJ SOLN
300.0000 ug | Freq: Once | INTRAMUSCULAR | Status: AC
  Administered 2013-04-21: 300 ug via SUBCUTANEOUS
  Filled 2013-04-21: qty 0.6

## 2013-04-21 NOTE — Progress Notes (Signed)
Hematology and Oncology Follow Up Visit  [name] [id number] 1932/09/04 80 y.o. 04/21/2013 1:50 PM  CC: [name]. [name], MD, FCCP    Principle Diagnosis: A 80 year old gentleman with the following diagnoses:   1. Follicular lymphoma as a grade 3, stage III, diagnosed 2007, continued to be in remission. 2. Pancytopenia associated with mild neutropenia and macrocytosis possibly indicating early myelodysplasia. This was repeated on 06/22/2012 confirmed the evidence of myelodysplastic syndrome 3. Monoclonal gammopathy of undetermined significance.  Prior Therapy:  1. Status post CHOP and rituximab.  He had complete response to therapy concluded in 2007.  No further imaging has been indicated at this time. 2. Status post bone marrow biopsy in April 2011, did not really show any evidence of myelodysplasia or metastatic lymphoma at this time.  Current therapy: Aranesp 300 mcg every 3 weeks to keep his hemoglobin close to 11. He also has been receiving intermittent transfusions.  Interim History: Mr. Cybulski presents today for a followup visit.  Since his last visit, he has been doing relatively well. He received packed red cell transfusion last week and improved his activity level and energy. No further bleeding episodes noted since hospital D/C in December of 2014. He is reporting less fatigue and less decline in his health since his last visit. He reports no chest pain but DOE. Had not reported any evidence of bleeding.  Had not had any infection. His activity level is less at this time. He does not report any dysphagia.  He does not report any lymphadenopathy. No night sweats. Overall performance status, activity level is about the same.   Medications: I have reviewed the patient's current medications Current Outpatient Prescriptions  Medication Sig Dispense Refill  . acetaminophen (TYLENOL) 500 MG tablet 1-2 every 4-6 hours as needed for headache.      . albuterol (PROVENTIL HFA;VENTOLIN  HFA) 108 (90 BASE) MCG/ACT inhaler 1-2 puffs every 4-6 hours as needed for wheezing/shortness of breath      . aspirin 81 MG tablet Take 81 mg by mouth every morning. (hold if bleeding)      . calcium-vitamin D (OS-CAL 500 + D) 500-200 MG-UNIT per tablet Take 1 tablet by mouth 2 (two) times daily.       . cetirizine (ZYRTEC) 10 MG tablet Take 10 mg by mouth at bedtime as needed for allergies.      . Cholecalciferol (VITAMIN D) 1000 UNITS capsule Take 1,000 Units by mouth 2 (two) times daily.        [name] dextromethorphan-guaiFENesin (MUCINEX DM) 30-600 MG per 12 hr tablet Take 1-2 tablets by mouth every 12 (twelve) hours as needed for cough.       . docusate sodium (COLACE) 100 MG capsule Take 100 mg by mouth at bedtime.      . famotidine (PEPCID) 20 MG tablet Take 20 mg by mouth at bedtime.       . finasteride (PROSCAR) 5 MG tablet Take 5 mg by mouth every morning.       . furosemide (LASIX) 40 MG tablet TAKE 1 TABLET BY MOUTH DAILY. MAY TAKE 1 EXTRA DAILY IF NEEDED FOR SWELLING IN THE LEGS  30 tablet  5  . hydrocortisone cream 1 % Apply 1 application topically 2 (two) times daily as needed (itchy rash).       . hydrOXYzine (ATARAX/VISTARIL) 25 MG tablet 1-2 tabs by mouth every 4-6 hours as needed for itching      . levothyroxine (SYNTHROID, LEVOTHROID) 25 MCG tablet  Take 1 tablet (25 mcg total) by mouth daily before breakfast.  30 tablet  5  . metFORMIN (GLUCOPHAGE) 500 MG tablet Take 500 mg by mouth 2 (two) times daily with a meal.       . methylcellulose (CITRUCEL) oral powder Take 1 packet by mouth at bedtime.      . mometasone (NASONEX) 50 MCG/ACT nasal spray 0-2 puffs each nostril in the morning, 1-2 puffs at bedtime      . mometasone-formoterol (DULERA) 200-5 MCG/ACT AERO Inhale 2 puffs into the lungs 2 (two) times daily.      . Multiple Vitamin (MULTIVITAMIN) tablet Take 1 tablet by mouth daily.        [name] omeprazole (PRILOSEC) 20 MG capsule 2 capsules by mouth every morning before breakfast       . oxybutynin (DITROPAN-XL) 10 MG 24 hr tablet Take 10 mg by mouth daily.      . polyethylene glycol powder (MIRALAX) powder Take 17 g by mouth at bedtime.       . potassium chloride SA (K-DUR,KLOR-CON) 20 MEQ tablet Take 20 mEq by mouth daily.      . sulindac (CLINORIL) 200 MG tablet TAKE 1 TABLET BY MOUTH TWICE A DAY AS NEEDED FOR ARTHRITIS PAIN      . traMADol (ULTRAM) 50 MG tablet Add 1 every 4 hours as needed for severe cough or pain      . traZODone (DESYREL) 150 MG tablet Take 1/3 tablet by mouth at bedtime       No current facility-administered medications for this visit.   Facility-Administered Medications Ordered in Other Visits  Medication Dose Route Frequency Provider Last Rate Last Dose  . darbepoetin (ARANESP) injection 300 mcg  300 mcg Subcutaneous Once Maryanna Shape, NP         Allergies: No Known Allergies  Past Medical History, Surgical history, Social history, and Family History were reviewed and updated.  Review of Systems:  Remaining ROS negative.  Physical Exam: Blood pressure 140/68, pulse 97, temperature 98 F (36.7 C), temperature source Oral, resp. rate 18, height _0  (1.803 m), weight 232 lb 12.8 oz (105.597 kg). ECOG: 1 General appearance: alert Head: Normocephalic, without obvious abnormality, atraumatic Neck: no adenopathy, no carotid bruit, no JVD, supple, symmetrical, trachea midline and thyroid not enlarged, symmetric, no tenderness/mass/nodules Lymph nodes: Cervical, supraclavicular, and axillary nodes normal. Heart:regular rate and rhythm, S1, S2 normal, no murmur, click, rub or gallop Lung:chest clear, no wheezing, rales, normal symmetric air entry Abdomen: soft, non-tender, without masses or organomegaly EXT:no erythema, induration, or nodules  Lab Results: Lab Results  Component Value Date   WBC 1.6* 04/21/2013   HGB 9.2* 04/21/2013   HCT 27.0* 04/21/2013   MCV 112.4* 04/21/2013   PLT 96* 04/21/2013     Chemistry      Component  Value Date/Time   NA 133* 01/20/2013 0526   NA 137 01/05/2013 1501   K 4.2 01/20/2013 0526   K 3.8 01/05/2013 1501   CL 99 01/20/2013 0526   CL 99 06/08/2012 1257   CO2 26 01/20/2013 0526   CO2 26 01/05/2013 1501   BUN 14 01/20/2013 0526   BUN 13.9 01/05/2013 1501   CREATININE 0.71 01/20/2013 0526   CREATININE 0.8 01/05/2013 1501      Component Value Date/Time   CALCIUM 9.1 01/20/2013 0526   CALCIUM 10.1 01/05/2013 1501   ALKPHOS 82 12/06/2012 1850   ALKPHOS 48 08/10/2012 1117   AST 17 12/06/2012 1850  AST 17 08/10/2012 1117   ALT <5 12/06/2012 1850   ALT 14 08/10/2012 1117   BILITOT 0.7 12/06/2012 1850   BILITOT 0.82 08/10/2012 1117      Impression and Plan:  This is a 80 year old gentleman with the following issues:  1. Follicular lymphoma diagnosed in 2007. He has no evidence to suggest recurrent disease. 2. Pancytopenia due to myelodysplastic syndrome. He is status post bone marrow biopsy was done on 06/22/2012.  He is to continue on Aranesp 300 mcg every 2 weeks I will also set him up with packed red cell transfusion as needed. I see no need for any packed red cell transfusions at this time. 3. Monoclonal gammopathy.  His M-spike had been less than 1 g/dL, his bone marrow biopsy continued to show a less than 8% plasma cells indicating most likely multiple myeloma.   4. Followup will be set up in in 05/2013 and every 6 weeks after that.   YOKHTX,HFSFS 3/27/20151:50 PM

## 2013-04-21 NOTE — Telephone Encounter (Signed)
gv pt appt schedule for april thru may

## 2013-04-26 ENCOUNTER — Ambulatory Visit (HOSPITAL_COMMUNITY)
Admission: RE | Admit: 2013-04-26 | Discharge: 2013-04-26 | Disposition: A | Discharge status: Home / Self Care | Payer: Medicare Other | Source: Ambulatory Visit | Attending: Oncology | Admitting: Oncology

## 2013-04-28 ENCOUNTER — Encounter: Payer: Self-pay | Admitting: Medical Oncology

## 2013-04-28 ENCOUNTER — Other Ambulatory Visit: Payer: Self-pay | Admitting: Adult Health

## 2013-04-28 ENCOUNTER — Other Ambulatory Visit (HOSPITAL_BASED_OUTPATIENT_CLINIC_OR_DEPARTMENT_OTHER): Payer: Medicare Other

## 2013-04-28 DIAGNOSIS — D469 Myelodysplastic syndrome, unspecified: Secondary | ICD-10-CM

## 2013-04-28 DIAGNOSIS — D61818 Other pancytopenia: Secondary | ICD-10-CM

## 2013-04-28 LAB — HOLD TUBE, BLOOD BANK

## 2013-04-28 LAB — CBC WITH DIFFERENTIAL/PLATELET
BASO%: 0 % (ref 0.0–2.0)
BASOS ABS: 0 10*3/uL (ref 0.0–0.1)
EOS%: 1.6 % (ref 0.0–7.0)
Eosinophils Absolute: 0 10*3/uL (ref 0.0–0.5)
HEMATOCRIT: 25.6 % — AB (ref 38.4–49.9)
HEMOGLOBIN: 8.7 g/dL — AB (ref 13.0–17.1)
LYMPH#: 0.7 10*3/uL — AB (ref 0.9–3.3)
LYMPH%: 56 % — ABNORMAL HIGH (ref 14.0–49.0)
MCH: 37 pg — ABNORMAL HIGH (ref 27.2–33.4)
MCHC: 34 g/dL (ref 32.0–36.0)
MCV: 108.9 fL — ABNORMAL HIGH (ref 79.3–98.0)
MONO#: 0 10*3/uL — ABNORMAL LOW (ref 0.1–0.9)
MONO%: 1.6 % (ref 0.0–14.0)
NEUT#: 0.5 10*3/uL — CL (ref 1.5–6.5)
NEUT%: 40.8 % (ref 39.0–75.0)
Platelets: 96 10*3/uL — ABNORMAL LOW (ref 140–400)
RBC: 2.35 10*6/uL — ABNORMAL LOW (ref 4.20–5.82)
RDW: 19.8 % — ABNORMAL HIGH (ref 11.0–14.6)
WBC: 1.3 10*3/uL — ABNORMAL LOW (ref 4.0–10.3)

## 2013-04-28 NOTE — Progress Notes (Signed)
Patient here for labs and possible blood transfusion. Hgb @ 8.7. Patient reports not feeling any more SOB or fatigued than ususal. Per APP/MD no blood transfusion today. Patient is neutropenic, [place] @ 0.5, neutroppenic education given, face mask provided. Patient encouraged to contact office with any concerns.

## 2013-05-12 ENCOUNTER — Ambulatory Visit (HOSPITAL_BASED_OUTPATIENT_CLINIC_OR_DEPARTMENT_OTHER): Payer: Medicare Other

## 2013-05-12 ENCOUNTER — Ambulatory Visit: Payer: Medicare Other

## 2013-05-12 ENCOUNTER — Other Ambulatory Visit (HOSPITAL_BASED_OUTPATIENT_CLINIC_OR_DEPARTMENT_OTHER): Payer: Medicare Other

## 2013-05-12 VITALS — BP 130/65 | HR 103 | Temp 97.8°F

## 2013-05-12 DIAGNOSIS — D469 Myelodysplastic syndrome, unspecified: Secondary | ICD-10-CM

## 2013-05-12 DIAGNOSIS — D649 Anemia, unspecified: Secondary | ICD-10-CM

## 2013-05-12 DIAGNOSIS — D61818 Other pancytopenia: Secondary | ICD-10-CM

## 2013-05-12 LAB — CBC WITH DIFFERENTIAL/PLATELET
BASO%: 0.5 % (ref 0.0–2.0)
BASOS ABS: 0 10*3/uL (ref 0.0–0.1)
EOS ABS: 0 10*3/uL (ref 0.0–0.5)
EOS%: 1.1 % (ref 0.0–7.0)
HEMATOCRIT: 26.6 % — AB (ref 38.4–49.9)
HEMOGLOBIN: 9 g/dL — AB (ref 13.0–17.1)
LYMPH%: 65.8 % — ABNORMAL HIGH (ref 14.0–49.0)
MCH: 37 pg — ABNORMAL HIGH (ref 27.2–33.4)
MCHC: 33.8 g/dL (ref 32.0–36.0)
MCV: 109.5 fL — AB (ref 79.3–98.0)
MONO#: 0 10*3/uL — ABNORMAL LOW (ref 0.1–0.9)
MONO%: 1.1 % (ref 0.0–14.0)
NEUT%: 31.5 % — AB (ref 39.0–75.0)
NEUTROS ABS: 0.6 10*3/uL — AB (ref 1.5–6.5)
PLATELETS: 93 10*3/uL — AB (ref 140–400)
RBC: 2.43 10*6/uL — ABNORMAL LOW (ref 4.20–5.82)
RDW: 18.4 % — ABNORMAL HIGH (ref 11.0–14.6)
WBC: 1.9 10*3/uL — ABNORMAL LOW (ref 4.0–10.3)
lymph#: 1.3 10*3/uL (ref 0.9–3.3)

## 2013-05-12 LAB — HOLD TUBE, BLOOD BANK

## 2013-05-12 MED ORDER — DARBEPOETIN ALFA-POLYSORBATE 300 MCG/0.6ML IJ SOLN
300.0000 ug | Freq: Once | INTRAMUSCULAR | Status: AC
Start: 2013-05-12 — End: 2013-05-12
  Administered 2013-05-12: 300 ug via SUBCUTANEOUS
  Filled 2013-05-12: qty 0.6

## 2013-05-12 NOTE — Progress Notes (Signed)
0855-DrAlen Blew notified that pt.'s HGB-9.0.  Pt states that he is SOB on exertion only and does not feel as though he needs a blood transfusion today.  No need for blood transfusion today per Dr. [name].  Aranesp given as ordered.  Neutropenic precautions reviewed with patient and he has no questions at this time.

## 2013-05-12 NOTE — Patient Instructions (Signed)
Neutropenia Neutropenia is a condition that occurs when the level of a certain type of white blood cell (neutrophil) in your body becomes lower than normal. Neutrophils are made in the bone marrow and fight infections. These cells protect against bacteria and viruses. The fewer neutrophils you have, and the longer your body remains without them, the greater your risk of getting a severe infection becomes. CAUSES  The cause of neutropenia may be hard to determine. However, it is usually due to 3 main problems:   Decreased production of neutrophils. This may be due to:  Certain medicines such as chemotherapy.  Genetic problems.  Cancer.  Radiation treatments.  Vitamin deficiency.  Some pesticides.  Increased destruction of neutrophils. This may be due to:  Overwhelming infections.  Hemolytic anemia. This is when the body destroys its own blood cells.  Chemotherapy.  Neutrophils moving to areas of the body where they cannot fight infections. This may be due to:  Dialysis procedures.  Conditions where the spleen becomes enlarged. Neutrophils are held in the spleen and are not available to the rest of the body.  Overwhelming infections. The neutrophils are held in the area of the infection and are not available to the rest of the body. SYMPTOMS  There are no specific symptoms of neutropenia. The lack of neutrophils can result in an infection, and an infection can cause various problems. DIAGNOSIS  Diagnosis is made by a blood test. A complete blood count is performed. The normal level of neutrophils in human blood differs with age and race. Infants have lower counts than older children and adults. African Americans have lower counts than Caucasians or Asians. The average adult level is 1500 cells/mm3 of blood. Neutrophil counts are interpreted as follows:  Greater than 1000 cells/mm3 gives normal protection against infection.  500 to 1000 cells/mm3 gives an increased risk for  infection.  200 to 500 cells/mm3 is a greater risk for severe infection.  Lower than 200 cells/mm3 is a marked risk of infection. This may require hospitalization and treatment with antibiotic medicines. TREATMENT  Treatment depends on the underlying cause, severity, and presence of infections or symptoms. It also depends on your health. Your caregiver will discuss the treatment plan with you. Mild cases are often easily treated and have a good outcome. Preventative measures may also be started to limit your risk of infections. Treatment can include:  Taking antibiotics.  Stopping medicines that are known to cause neutropenia.  Correcting nutritional deficiencies by eating green vegetables to supply folic acid and taking vitamin B supplements.  Stopping exposure to pesticides if your neutropenia is related to pesticide exposure.  Taking a blood growth factor called sargramostim, pegfilgrastim, or filgrastim if you are undergoing chemotherapy for cancer. This stimulates white blood cell production.  Removal of the spleen if you have Felty's syndrome and have repeated infections. HOME CARE INSTRUCTIONS   Follow your caregiver's instructions about when you need to have blood work done.  Wash your hands often. Make sure others who come in contact with you also wash their hands.  Wash raw fruits and vegetables before eating them. They can carry bacteria and fungi.  Avoid people with colds or spreadable (contagious) diseases (chickenpox, herpes zoster, influenza).  Avoid large crowds.  Avoid construction areas. The dust can release fungus into the air.  Be cautious around children in daycare or school environments.  Take care of your respiratory system by coughing and deep breathing.  Bathe daily.  Protect your skin from cuts and   burns.  Do not work in the garden or with flowers and plants.  Care for the mouth before and after meals by brushing with a soft toothbrush. If you have  mucositis, do not use mouthwash. Mouthwash contains alcohol and can dry out the mouth even more.  Clean the area between the genitals and the anus (perineal area) after urination and bowel movements. Women need to wipe from front to back.  Use a water soluble lubricant during sexual intercourse and practice good hygiene after. Do not have intercourse if you are severely neutropenic. Check with your caregiver for guidelines.  Exercise daily as tolerated.  Avoid people who were vaccinated with a live vaccine in the past 30 days. You should not receive live vaccines (polio, typhoid).  Do not provide direct care for pets. Avoid animal droppings. Do not clean litter boxes and bird cages.  Do not share food utensils.  Do not use tampons, enemas, or rectal suppositories unless directed by your caregiver.  Use an electric razor to remove hair.  Wash your hands after handling magazines, letters, and newspapers. SEEK IMMEDIATE MEDICAL CARE IF:   You have a fever.  You have chills or start to shake.  You feel nauseous or vomit.  You develop mouth sores.  You develop aches and pains.  You have redness and swelling around open wounds.  Your skin is warm to the touch.  You have pus coming from your wounds.  You develop swollen lymph nodes.  You feel weak or fatigued.  You develop red streaks on the skin. MAKE SURE YOU:  Understand these instructions.  Will watch your condition.  Will get help right away if you are not doing well or get worse. Document Released: 07/04/2001 Document Revised: 04/06/2011 Document Reviewed: 08/01/2010 [employer] Patient Information 2014 [employer].  

## 2013-05-26 ENCOUNTER — Ambulatory Visit (HOSPITAL_BASED_OUTPATIENT_CLINIC_OR_DEPARTMENT_OTHER): Payer: Medicare Other

## 2013-05-26 ENCOUNTER — Other Ambulatory Visit: Payer: Self-pay | Admitting: Medical Oncology

## 2013-05-26 ENCOUNTER — Other Ambulatory Visit (HOSPITAL_BASED_OUTPATIENT_CLINIC_OR_DEPARTMENT_OTHER): Payer: Medicare Other

## 2013-05-26 ENCOUNTER — Ambulatory Visit: Payer: Medicare Other

## 2013-05-26 ENCOUNTER — Ambulatory Visit (HOSPITAL_COMMUNITY)
Admission: RE | Admit: 2013-05-26 | Discharge: 2013-05-26 | Disposition: A | Discharge status: Home / Self Care | Payer: Medicare Other | Source: Ambulatory Visit | Attending: Oncology | Admitting: Oncology

## 2013-05-26 VITALS — BP 119/75 | HR 77 | Temp 97.6°F | Resp 18

## 2013-05-26 DIAGNOSIS — C8595 Non-Hodgkin lymphoma, unspecified, lymph nodes of inguinal region and lower limb: Secondary | ICD-10-CM

## 2013-05-26 DIAGNOSIS — D469 Myelodysplastic syndrome, unspecified: Secondary | ICD-10-CM

## 2013-05-26 DIAGNOSIS — C8590 Non-Hodgkin lymphoma, unspecified, unspecified site: Secondary | ICD-10-CM

## 2013-05-26 DIAGNOSIS — D61818 Other pancytopenia: Secondary | ICD-10-CM

## 2013-05-26 DIAGNOSIS — D649 Anemia, unspecified: Secondary | ICD-10-CM | POA: Insufficient documentation

## 2013-05-26 LAB — CBC WITH DIFFERENTIAL/PLATELET
BASO%: 0.1 % (ref 0.0–2.0)
BASOS ABS: 0 10*3/uL (ref 0.0–0.1)
EOS%: 4.1 % (ref 0.0–7.0)
Eosinophils Absolute: 0.1 10*3/uL (ref 0.0–0.5)
HCT: 24.3 % — ABNORMAL LOW (ref 38.4–49.9)
HGB: 8.3 g/dL — ABNORMAL LOW (ref 13.0–17.1)
LYMPH%: 53.7 % — AB (ref 14.0–49.0)
MCH: 39 pg — ABNORMAL HIGH (ref 27.2–33.4)
MCHC: 34 g/dL (ref 32.0–36.0)
MCV: 114.6 fL — ABNORMAL HIGH (ref 79.3–98.0)
MONO#: 0 10*3/uL — ABNORMAL LOW (ref 0.1–0.9)
MONO%: 1 % (ref 0.0–14.0)
NEUT#: 0.6 10*3/uL — ABNORMAL LOW (ref 1.5–6.5)
NEUT%: 41.1 % (ref 39.0–75.0)
PLATELETS: 94 10*3/uL — AB (ref 140–400)
RBC: 2.12 10*6/uL — ABNORMAL LOW (ref 4.20–5.82)
RDW: 19.6 % — ABNORMAL HIGH (ref 11.0–14.6)
WBC: 1.5 10*3/uL — ABNORMAL LOW (ref 4.0–10.3)
lymph#: 0.8 10*3/uL — ABNORMAL LOW (ref 0.9–3.3)

## 2013-05-26 LAB — PREPARE RBC (CROSSMATCH)

## 2013-05-26 LAB — HOLD TUBE, BLOOD BANK

## 2013-05-26 LAB — TECHNOLOGIST REVIEW

## 2013-05-26 MED ORDER — DIPHENHYDRAMINE HCL 25 MG PO CAPS
25.0000 mg | ORAL_CAPSULE | Freq: Once | ORAL | Status: AC
  Administered 2013-05-26: 25 mg via ORAL

## 2013-05-26 MED ORDER — ACETAMINOPHEN 325 MG PO TABS
ORAL_TABLET | ORAL | Status: AC
  Filled 2013-05-26: qty 2

## 2013-05-26 MED ORDER — DIPHENHYDRAMINE HCL 25 MG PO CAPS
ORAL_CAPSULE | ORAL | Status: AC
  Filled 2013-05-26: qty 1

## 2013-05-26 MED ORDER — SODIUM CHLORIDE 0.9 % IV SOLN
250.0000 mL | Freq: Once | INTRAVENOUS | Status: AC
  Administered 2013-05-26: 250 mL via INTRAVENOUS

## 2013-05-26 MED ORDER — ACETAMINOPHEN 325 MG PO TABS
650.0000 mg | ORAL_TABLET | Freq: Once | ORAL | Status: AC
  Administered 2013-05-26: 650 mg via ORAL

## 2013-05-26 MED ORDER — DARBEPOETIN ALFA-POLYSORBATE 300 MCG/0.6ML IJ SOLN
300.0000 ug | Freq: Once | INTRAMUSCULAR | Status: AC
  Administered 2013-05-26: 300 ug via SUBCUTANEOUS
  Filled 2013-05-26: qty 0.6

## 2013-05-26 NOTE — Patient Instructions (Signed)
Blood Transfusion Information WHAT IS A BLOOD TRANSFUSION? A transfusion is the replacement of blood or some of its parts. Blood is made up of multiple cells which provide different functions.  Red blood cells carry oxygen and are used for blood loss replacement.  White blood cells fight against infection.  Platelets control bleeding.  Plasma helps clot blood.  Other blood products are available for specialized needs, such as hemophilia or other clotting disorders. BEFORE THE TRANSFUSION  Who gives blood for transfusions?   You may be able to donate blood to be used at a later date on yourself (autologous donation).  Relatives can be asked to donate blood. This is generally not any safer than if you have received blood from a stranger. The same precautions are taken to ensure safety when a relative's blood is donated.  Healthy volunteers who are fully evaluated to make sure their blood is safe. This is blood bank blood. Transfusion therapy is the safest it has ever been in the practice of medicine. Before blood is taken from a donor, a complete history is taken to make sure that person has no history of diseases nor engages in risky social behavior (examples are intravenous drug use or sexual activity with multiple partners). The donor's travel history is screened to minimize risk of transmitting infections, such as malaria. The donated blood is tested for signs of infectious diseases, such as HIV and hepatitis. The blood is then tested to be sure it is compatible with you in order to minimize the chance of a transfusion reaction. If you or a relative donates blood, this is often done in anticipation of surgery and is not appropriate for emergency situations. It takes many days to process the donated blood. RISKS AND COMPLICATIONS Although transfusion therapy is very safe and saves many lives, the main dangers of transfusion include:   Getting an infectious disease.  Developing a  transfusion reaction. This is an allergic reaction to something in the blood you were given. Every precaution is taken to prevent this. The decision to have a blood transfusion has been considered carefully by your caregiver before blood is given. Blood is not given unless the benefits outweigh the risks. AFTER THE TRANSFUSION  Right after receiving a blood transfusion, you will usually feel much better and more energetic. This is especially true if your red blood cells have gotten low (anemic). The transfusion raises the level of the red blood cells which carry oxygen, and this usually causes an energy increase.  The nurse administering the transfusion will monitor you carefully for complications. HOME CARE INSTRUCTIONS  No special instructions are needed after a transfusion. You may find your energy is better. Speak with your caregiver about any limitations on activity for underlying diseases you may have. SEEK MEDICAL CARE IF:   Your condition is not improving after your transfusion.  You develop redness or irritation at the intravenous (IV) site. SEEK IMMEDIATE MEDICAL CARE IF:  Any of the following symptoms occur over the next 12 hours:  Shaking chills.  You have a temperature by mouth above 102 F (38.9 C), not controlled by medicine.  Chest, back, or muscle pain.  People around you feel you are not acting correctly or are confused.  Shortness of breath or difficulty breathing.  Dizziness and fainting.  You get a rash or develop hives.  You have a decrease in urine output.  Your urine turns a dark color or changes to pink, red, or brown. Any of the following   symptoms occur over the next 10 days:  You have a temperature by mouth above 102 F (38.9 C), not controlled by medicine.  Shortness of breath.  Weakness after normal activity.  The white part of the eye turns yellow (jaundice).  You have a decrease in the amount of urine or are urinating less often.  Your  urine turns a dark color or changes to pink, red, or brown. Document Released: 01/10/2000 Document Revised: 04/06/2011 Document Reviewed: 08/29/2007 ExitCare Patient Information 2014 [employer].  

## 2013-05-27 LAB — TYPE AND SCREEN
ABO/RH(D): A POS
Antibody Screen: NEGATIVE
UNIT DIVISION: 0
Unit division: 0

## 2013-06-09 ENCOUNTER — Telehealth: Payer: Self-pay | Admitting: *Deleted

## 2013-06-09 ENCOUNTER — Ambulatory Visit (HOSPITAL_BASED_OUTPATIENT_CLINIC_OR_DEPARTMENT_OTHER): Payer: Medicare Other | Admitting: Physician Assistant

## 2013-06-09 ENCOUNTER — Other Ambulatory Visit (HOSPITAL_BASED_OUTPATIENT_CLINIC_OR_DEPARTMENT_OTHER): Payer: Medicare Other

## 2013-06-09 ENCOUNTER — Other Ambulatory Visit: Payer: Self-pay

## 2013-06-09 ENCOUNTER — Telehealth: Payer: Self-pay | Admitting: Oncology

## 2013-06-09 ENCOUNTER — Encounter: Payer: Self-pay | Admitting: Physician Assistant

## 2013-06-09 ENCOUNTER — Ambulatory Visit (HOSPITAL_BASED_OUTPATIENT_CLINIC_OR_DEPARTMENT_OTHER): Payer: Medicare Other

## 2013-06-09 VITALS — BP 131/67 | HR 91 | Temp 98.4°F | Resp 20 | Ht 71.0 in | Wt 234.3 lb

## 2013-06-09 DIAGNOSIS — D472 Monoclonal gammopathy: Secondary | ICD-10-CM

## 2013-06-09 DIAGNOSIS — D469 Myelodysplastic syndrome, unspecified: Secondary | ICD-10-CM | POA: Diagnosis not present

## 2013-06-09 DIAGNOSIS — D61818 Other pancytopenia: Secondary | ICD-10-CM

## 2013-06-09 DIAGNOSIS — Z87898 Personal history of other specified conditions: Secondary | ICD-10-CM

## 2013-06-09 DIAGNOSIS — D649 Anemia, unspecified: Secondary | ICD-10-CM

## 2013-06-09 LAB — CBC WITH DIFFERENTIAL/PLATELET
BASO%: 0 % (ref 0.0–2.0)
BASOS ABS: 0 10*3/uL (ref 0.0–0.1)
EOS%: 2.1 % (ref 0.0–7.0)
Eosinophils Absolute: 0 10*3/uL (ref 0.0–0.5)
HCT: 29.4 % — ABNORMAL LOW (ref 38.4–49.9)
HEMOGLOBIN: 10.1 g/dL — AB (ref 13.0–17.1)
LYMPH%: 57.2 % — ABNORMAL HIGH (ref 14.0–49.0)
MCH: 35.9 pg — AB (ref 27.2–33.4)
MCHC: 34.4 g/dL (ref 32.0–36.0)
MCV: 104.6 fL — ABNORMAL HIGH (ref 79.3–98.0)
MONO#: 0 10*3/uL — ABNORMAL LOW (ref 0.1–0.9)
MONO%: 1.4 % (ref 0.0–14.0)
NEUT#: 0.6 10*3/uL — ABNORMAL LOW (ref 1.5–6.5)
NEUT%: 39.3 % (ref 39.0–75.0)
Platelets: 83 10*3/uL — ABNORMAL LOW (ref 140–400)
RBC: 2.81 10*6/uL — ABNORMAL LOW (ref 4.20–5.82)
RDW: 20 % — ABNORMAL HIGH (ref 11.0–14.6)
WBC: 1.5 10*3/uL — ABNORMAL LOW (ref 4.0–10.3)
lymph#: 0.8 10*3/uL — ABNORMAL LOW (ref 0.9–3.3)

## 2013-06-09 LAB — HOLD TUBE, BLOOD BANK

## 2013-06-09 MED ORDER — DARBEPOETIN ALFA-POLYSORBATE 300 MCG/0.6ML IJ SOLN
300.0000 ug | Freq: Once | INTRAMUSCULAR | Status: AC
  Administered 2013-06-09: 300 ug via SUBCUTANEOUS
  Filled 2013-06-09: qty 0.6

## 2013-06-09 NOTE — Progress Notes (Signed)
Hematology and Oncology Follow Up Visit  [name] [id number] 1933-01-15 80 y.o. 06/09/2013 2:40 PM  CC: [name], MD, FCCP    Principle Diagnosis: A 80 year old gentleman with the following diagnoses:   1. Follicular lymphoma as a grade 3, stage III, diagnosed 2007, continued to be in remission. 2. Pancytopenia associated with mild neutropenia and macrocytosis possibly indicating early myelodysplasia. This was repeated on 06/22/2012 confirmed the evidence of myelodysplastic syndrome 3. Monoclonal gammopathy of undetermined significance.  Prior Therapy:  1. Status post CHOP and rituximab.  He had complete response to therapy concluded in 2007.  No further imaging has been indicated at this time. 2. Status post bone marrow biopsy in April 2011, did not really show any evidence of myelodysplasia or metastatic lymphoma at this time.  Current therapy: Aranesp 300 mcg every 3 weeks to keep his hemoglobin close to 11. He also has been receiving intermittent transfusions.  Interim History: Mr. Hornback presents today for a followup visit.  Since his last visit, he has been doing relatively well. He reports undergoing cataract surgery on his right eye on 06/06/2013. He tolerated this procedure without difficulty. Regarding his pancytopenia, he denies any bleeding or bruising. Denied any fever or chills.  No further bleeding episodes noted since hospital D/C in December of 2014. He is reporting less fatigue and less decline in his health since his last visit. He reports no chest pain but continues to have some dyspnea on exertion. Denies any infection. He does not report any dysphagia.  He does not report any lymphadenopathy. Denies night sweats. Overall performance status, activity level is relatively stable.   Medications: I have reviewed the patient's current medications Current Outpatient Prescriptions  Medication Sig Dispense Refill  . acetaminophen (TYLENOL) 500 MG tablet 1-2 every  4-6 hours as needed for headache.      . albuterol (PROVENTIL HFA;VENTOLIN HFA) 108 (90 BASE) MCG/ACT inhaler 1-2 puffs every 4-6 hours as needed for wheezing/shortness of breath      . aspirin 81 MG tablet Take 81 mg by mouth every morning. (hold if bleeding)      . calcium-vitamin D (OS-CAL 500 + D) 500-200 MG-UNIT per tablet Take 1 tablet by mouth 2 (two) times daily.       . cetirizine (ZYRTEC) 10 MG tablet Take 10 mg by mouth at bedtime as needed for allergies.      . Cholecalciferol (VITAMIN D) 1000 UNITS capsule Take 1,000 Units by mouth 2 (two) times daily.        [name] dextromethorphan-guaiFENesin (MUCINEX DM) 30-600 MG per 12 hr tablet Take 1-2 tablets by mouth every 12 (twelve) hours as needed for cough.       . docusate sodium (COLACE) 100 MG capsule Take 100 mg by mouth at bedtime.      . famotidine (PEPCID) 20 MG tablet Take 20 mg by mouth at bedtime.       . finasteride (PROSCAR) 5 MG tablet Take 5 mg by mouth every morning.       . furosemide (LASIX) 40 MG tablet TAKE 1 TABLET BY MOUTH DAILY. MAY TAKE 1 EXTRA DAILY IF NEEDED FOR SWELLING IN THE LEGS  30 tablet  5  . hydrocortisone cream 1 % Apply 1 application topically 2 (two) times daily as needed (itchy rash).       . hydrOXYzine (ATARAX/VISTARIL) 25 MG tablet 1-2 tabs by mouth every 4-6 hours as needed for itching      . levothyroxine (SYNTHROID,  LEVOTHROID) 25 MCG tablet Take 1 tablet (25 mcg total) by mouth daily before breakfast.  30 tablet  5  . metFORMIN (GLUCOPHAGE) 500 MG tablet TAKE 1 TABLET BY MOUTH TWICE DAILY WITH FOOD  60 tablet  5  . methylcellulose (CITRUCEL) oral powder Take 1 packet by mouth at bedtime.      . mometasone (NASONEX) 50 MCG/ACT nasal spray 0-2 puffs each nostril in the morning, 1-2 puffs at bedtime      . mometasone-formoterol (DULERA) 200-5 MCG/ACT AERO Inhale 2 puffs into the lungs 2 (two) times daily.      . Multiple Vitamin (MULTIVITAMIN) tablet Take 1 tablet by mouth daily.        [name] omeprazole  (PRILOSEC) 20 MG capsule 2 capsules by mouth every morning before breakfast      . oxybutynin (DITROPAN-XL) 10 MG 24 hr tablet Take 10 mg by mouth daily.      . polyethylene glycol powder (MIRALAX) powder Take 17 g by mouth at bedtime.       . potassium chloride SA (K-DUR,KLOR-CON) 20 MEQ tablet Take 20 mEq by mouth daily.      . sulindac (CLINORIL) 200 MG tablet TAKE 1 TABLET BY MOUTH TWICE A DAY AS NEEDED FOR ARTHRITIS PAIN      . traMADol (ULTRAM) 50 MG tablet Add 1 every 4 hours as needed for severe cough or pain      . traZODone (DESYREL) 150 MG tablet Take 1/3 tablet by mouth at bedtime       No current facility-administered medications for this visit.     Allergies: No Known Allergies  Past Medical History, Surgical history, Social history, and Family History were reviewed and updated.  Review of Systems:  Remaining ROS negative.  Physical Exam: Blood pressure 131/67, pulse 91, temperature 98.4 F (36.9 C), temperature source Oral, resp. rate 20, height _0  (1.803 m), weight 234 lb 4.8 oz (106.278 kg), SpO2 100.00%. ECOG: 1 General appearance: alert, wearing sunglasses for protection related to his recent cataract surgery Head: Normocephalic, without obvious abnormality, atraumatic Neck: no adenopathy, no carotid bruit, no JVD, supple, symmetrical, trachea midline and thyroid not enlarged, symmetric, no tenderness/mass/nodules Lymph nodes: Cervical, supraclavicular, and axillary nodes normal. Heart:regular rate and rhythm, S1, S2 normal, no murmur, click, rub or gallop Lung:chest clear, no wheezing, rales, normal symmetric air entry Abdomen: soft, non-tender, without masses or organomegaly EXT:no erythema, induration, or nodules  Lab Results: Lab Results  Component Value Date   WBC 1.5* 06/09/2013   HGB 10.1* 06/09/2013   HCT 29.4* 06/09/2013   MCV 104.6* 06/09/2013   PLT 83* 06/09/2013     Chemistry      Component Value Date/Time   NA 133* 01/20/2013 0526   NA 137  01/05/2013 1501   K 4.2 01/20/2013 0526   K 3.8 01/05/2013 1501   CL 99 01/20/2013 0526   CL 99 06/08/2012 1257   CO2 26 01/20/2013 0526   CO2 26 01/05/2013 1501   BUN 14 01/20/2013 0526   BUN 13.9 01/05/2013 1501   CREATININE 0.71 01/20/2013 0526   CREATININE 0.8 01/05/2013 1501      Component Value Date/Time   CALCIUM 9.1 01/20/2013 0526   CALCIUM 10.1 01/05/2013 1501   ALKPHOS 82 12/06/2012 1850   ALKPHOS 48 08/10/2012 1117   AST 17 12/06/2012 1850   AST 17 08/10/2012 1117   ALT <5 12/06/2012 1850   ALT 14 08/10/2012 1117   BILITOT 0.7 12/06/2012 1850  BILITOT 0.82 08/10/2012 1117      Impression and Plan:  This is a 80 year old gentleman with the following issues:  1. Follicular lymphoma diagnosed in 2007. He has no evidence to suggest recurrent disease. 2. Pancytopenia due to myelodysplastic syndrome. He is status post bone marrow biopsy was done on 06/22/2012.  He is to continue on Aranesp 300 mcg every 2 weeks. We will continue to set him up with packed red cell transfusion as needed. I see no need for any packed red cell transfusions at this time. 3. Monoclonal gammopathy.  His M-spike had been less than 1 g/dL, his bone marrow biopsy continued to show a less than 8% plasma cells indicating most likely multiple myeloma.   4. Followup will be set up  every 6 weeks.   Carlton Adam, PA-C 5/15/20152:40 PM

## 2013-06-09 NOTE — Telephone Encounter (Signed)
Per staff message and POF I have scheduled appts.  JMW  

## 2013-06-09 NOTE — Telephone Encounter (Signed)
Gave pt appt for lab, md and Injection for May and June , emailed [place] regarding PRBC

## 2013-06-12 NOTE — Patient Instructions (Signed)
Continue labs and Aranesp injections as scheduled Followup in 6 weeks. We will also plan to obtain a type and hold tube in the event that you require a blood transfusion

## 2013-06-13 ENCOUNTER — Telehealth: Payer: Self-pay | Admitting: Oncology

## 2013-06-13 NOTE — Telephone Encounter (Signed)
Called pt and left message regarding appt , mailed appt to pt

## 2013-06-14 ENCOUNTER — Other Ambulatory Visit (INDEPENDENT_AMBULATORY_CARE_PROVIDER_SITE_OTHER): Payer: Medicare Other

## 2013-06-14 ENCOUNTER — Ambulatory Visit (INDEPENDENT_AMBULATORY_CARE_PROVIDER_SITE_OTHER): Payer: Medicare Other | Admitting: Internal Medicine

## 2013-06-14 ENCOUNTER — Encounter: Payer: Self-pay | Admitting: Internal Medicine

## 2013-06-14 VITALS — BP 122/80 | HR 86 | Temp 97.7°F | Ht 71.0 in | Wt 234.6 lb

## 2013-06-14 DIAGNOSIS — J45909 Unspecified asthma, uncomplicated: Secondary | ICD-10-CM

## 2013-06-14 DIAGNOSIS — IMO0002 Reserved for concepts with insufficient information to code with codable children: Secondary | ICD-10-CM

## 2013-06-14 DIAGNOSIS — E1365 Other specified diabetes mellitus with hyperglycemia: Secondary | ICD-10-CM

## 2013-06-14 DIAGNOSIS — E039 Hypothyroidism, unspecified: Secondary | ICD-10-CM

## 2013-06-14 DIAGNOSIS — Z23 Encounter for immunization: Secondary | ICD-10-CM

## 2013-06-14 LAB — HEMOGLOBIN A1C: Hgb A1c MFr Bld: 6.1 % (ref 4.6–6.5)

## 2013-06-14 LAB — BASIC METABOLIC PANEL
BUN: 17 mg/dL (ref 6–23)
CO2: 31 mEq/L (ref 19–32)
Calcium: 9.8 mg/dL (ref 8.4–10.5)
Chloride: 100 mEq/L (ref 96–112)
Creatinine, Ser: 0.9 mg/dL (ref 0.4–1.5)
GFR: 92.46 mL/min (ref >60.00)
GLUCOSE: 120 mg/dL — AB (ref 70–99)
POTASSIUM: 3.8 meq/L (ref 3.5–5.1)
Sodium: 137 mEq/L (ref 135–145)

## 2013-06-14 NOTE — Patient Instructions (Addendum)
Prevnar today  Please remember to go to the lab   department downstairs for your tests - we will call you with the results when they are available.     See calendar for specific medication instructions and bring it back for each and every office visit for every healthcare provider you see.  Without it,  you may not receive the best quality medical care that we feel you deserve.  You will note that the calendar groups together  your maintenance  medications that are timed at particular times of the day.  Think of this as your checklist for what your doctor has instructed you to do until your next evaluation to see what benefit  there is  to staying on a consistent group of medications intended to keep you well.  The other group at the bottom is entirely up to you to use as you see fit  for specific symptoms that may arise between visits that require you to treat them on an as needed basis.  Think of this as your action plan or "what if" list.   Separating the top medications from the bottom group is fundamental to providing you adequate care going forward.    Please schedule a follow up visit in 3 months but call sooner if needed to see Tammy with all meds in 2 bags, the automatics vs the as needed, so we can be sure we're all keeping up with your meds

## 2013-06-14 NOTE — Progress Notes (Signed)
Subjective:     Patient ID: [name], male   DOB: [birth year]    MRN: [id number]  Brief patient profile:  37 yowm quit smoking 1970 with morbid obesity with boderline DM, Lymphoma s/p chemo ([facility]) and tendency to peripheral edema that is felt to be dependent and related to obesity/ venous insuff.   History of Present Illness  09/05/2012 acute  ov/Wert new problem = rash, breathing and cough resolved Chief Complaint  Patient presents with  . Acute Visit    Pt c/o rash and burning sensation left shoulder radiating down left arm- started to notice about a wk ago  initially though to be shingles but rash is actually bilateral upper arms and abd wall, itching rather than painful, no assoc fever or ha, arthralgias  >>refer to derm , vistaril rx      Admit date: 10/27/2012  Discharge date: 11/08/2012  Primary Care Physician: Christinia Gully, MD  Final Discharge Diagnoses:  Staph aureus bacteremia/sepsis of unclear etiology, likely with assumption that the pacemaker is contaminated  Secondary diagnosis . acute encephalopathy . Abdominal pain . Bacteremia due to Staphylococcus aureus . generalized debility  . Other pancytopenia . Hyponatremia . LYMPHOMA NEC, MLIG, INGUINAL/LOWER LIMB . Essential hypertension, benign . GERD . DM (diabetes mellitus), secondary uncontrolled      06/14/2013 f/u ov/Wert re: aodm/ asthma/hypothyroid Chief Complaint  Patient presents with  . Follow-up    Pt c/o incr SOB with some cough. Pt reports using candy to soothe throat to help reduce cough.      Not needing any saba - cough is dry and daytime not disturbing sleep, more of a throat clearing    No obvious day to day or daytime variabilty or assoc chronic cough or cp or chest tightness, subjective wheeze overt sinus or hb symptoms. No unusual exp hx or h/o childhood pna/ asthma or knowledge of premature birth.  Sleeping ok without nocturnal  or early am exacerbation  of respiratory  c/o's  or need for noct saba. Also denies any obvious fluctuation of symptoms with weather or environmental changes or other aggravating or alleviating factors except as outlined above   Current Medications, Allergies, Complete Past Medical History, Past Surgical History, Family History, and Social History were reviewed in [employer] record.  ROS  The following are not active complaints unless bolded sore throat, dysphagia, dental problems, itching, sneezing,  nasal congestion or excess/ purulent secretions, ear ache,   fever, chills, sweats, unintended wt loss, pleuritic or exertional cp, hemoptysis,  orthopnea pnd or leg swelling, presyncope, palpitations, heartburn, abdominal pain, anorexia, nausea, vomiting, diarrhea  or change in bowel or urinary habits, change in stools or urine, dysuria,hematuria,  rash, arthralgias, visual complaints, headache, numbness weakness generalized  or ataxia or problems with walking or coordination,  change in mood/affect or memory.                           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   [facility]  - MRI 11/23/2007 c/w L1-2 bulging disc indents the thecal sac with foraminal stenosis  HEALTH MAINTENANCE..........................................................................................Marland KitchenWert  - Td 10/2005  -  Pneumovax 10/07 second shot  - CPX 08/20/2010  --colon 02/2005 -int/extern. hems (repeat q7y)  Complex med regimen  --Meds reviewed with pt education and computerized med calendar completed/adjusted. November 08, 2008 , August 21, 2009 updated 02/19/2011 , 10/23/2011  Syncope...........................................................................................................[facility] Kitchen[facility]och[facility]  - Mobitz II AV block s/p Medtronic pacemaker placement 12/11/2008  [facility]at[facility].....................................................................................................[facility]  - Pruritic rash 04/2009 > tried lotrisone, referred to [facility] August 06, 2009  Memory impairment , MMSE 27/30 8/25>refer to neuro   Family History:   mother had diabetes   Social History:  Patient states former smoker.  quit smoking in 1970  No ETOH  Retired            Objective:   Physical Exam wt 244 January 16, 2008 > 249 April 24, 2009 > 249 10/06/2010 >  11/27/2010  245 > 01/07/2011 241 > 02/19/11 238 > 06/04/2011  247 >07/03/2011 246 > 07/31/2011  248 > 09/11/2011  247 >246 10/23/2011 > 258 02/15/2012 > 05/19/2012  258 > 08/19/2012 255 >  266 09/05/12>  11/24/12 256>235 01/02/13 > 237  01/23/2013  237>>230 03/06/2013 >  06/14/13 234 amb obese wm in no acute distress  HEENT: nl dentition, turbinates, and orophanx.  Mucus membranes pink and moist. No tongue or throat exudate.  Neck: supple with full ROM. no JVD, node enlargement or TMJ   Lungs: clear and equal bilateral breath sounds. No wheezing, rhonchi or rales  Chest: RRR. No murmurs, rubs, or gallops.   Tr- peripheral edema.  Abd: obese but soft and no rebound or focal tenderness. Normal excursion in the supine position.  Ext warm and dry, no calf tenderness, cyanosis or clubbing. mild/mod chronic venous insufficiency changes. Varicose veins present. Neuro: nl sensorium and gait..  Skin:   No rash    12/30/12 cxr Left-sided pacemaker has been removed.  Interval  placement of dual lead pacemaker from right-sided approach.  Right atrial and right ventricular leads in good position. No  pneumothorax  Cardiac enlargement without heart failure infiltrate or effusion.   Assessment:

## 2013-06-15 ENCOUNTER — Telehealth: Payer: Self-pay | Admitting: Internal Medicine

## 2013-06-15 NOTE — Progress Notes (Signed)
Quick Note:  Spoke with pt and notified of results per Dr. Wert. Pt verbalized understanding and denied any questions.  ______ 

## 2013-06-15 NOTE — Assessment & Plan Note (Signed)
-   hfa   08/19/2012  75%   - trial of dulera 200 08/19/2012  > improved 09/05/12   Adequate control on present rx, reviewed > no change in rx needed

## 2013-06-15 NOTE — Assessment & Plan Note (Signed)
.   Lab Results  Component Value Date   TSH 2.89 03/06/2013    Adequate control on present rx, reviewed > no change in rx needed

## 2013-06-15 NOTE — Progress Notes (Signed)
Quick Note:  lmtcb ______ 

## 2013-06-15 NOTE — Assessment & Plan Note (Signed)
-   New onset 2012 while on freq doses of pred for airways dz >>Nutritionist referral 06/20/2010  And started on metfomin 07/02/10   Lab Results  Component Value Date   HGBA1C 6.1 06/14/2013   HGBA1C 5.7 03/06/2013   HGBA1C 6.1* 12/09/2012     Adequate control on present rx, reviewed > no change in rx needed

## 2013-06-15 NOTE — Telephone Encounter (Signed)
Call patient : Studies are unremarkable, no change in recs  Spoke with pt and notified of results per Dr. Wert. Pt verbalized understanding and denied any questions.  

## 2013-06-21 ENCOUNTER — Telehealth: Payer: Self-pay | Admitting: Internal Medicine

## 2013-06-21 ENCOUNTER — Other Ambulatory Visit: Payer: Self-pay | Admitting: Adult Health

## 2013-06-21 ENCOUNTER — Other Ambulatory Visit: Payer: Self-pay

## 2013-06-21 NOTE — Telephone Encounter (Signed)
Transmission received. No episodes associated with time of dizziness.

## 2013-06-21 NOTE — Telephone Encounter (Signed)
New message     Pt has a pacemaker.  Yesterday he had a dizzy spell and they did a remote transmission check.  Did you get the report and is it ok?

## 2013-06-22 ENCOUNTER — Other Ambulatory Visit: Payer: Self-pay | Admitting: Adult Health

## 2013-06-22 ENCOUNTER — Other Ambulatory Visit: Payer: Self-pay | Admitting: Physician Assistant

## 2013-06-22 DIAGNOSIS — D649 Anemia, unspecified: Secondary | ICD-10-CM

## 2013-06-23 ENCOUNTER — Ambulatory Visit (HOSPITAL_BASED_OUTPATIENT_CLINIC_OR_DEPARTMENT_OTHER): Payer: Medicare Other

## 2013-06-23 ENCOUNTER — Other Ambulatory Visit (HOSPITAL_BASED_OUTPATIENT_CLINIC_OR_DEPARTMENT_OTHER): Payer: Medicare Other

## 2013-06-23 VITALS — BP 113/59 | HR 86 | Temp 97.4°F

## 2013-06-23 DIAGNOSIS — D649 Anemia, unspecified: Secondary | ICD-10-CM

## 2013-06-23 DIAGNOSIS — D61818 Other pancytopenia: Secondary | ICD-10-CM

## 2013-06-23 DIAGNOSIS — D469 Myelodysplastic syndrome, unspecified: Secondary | ICD-10-CM

## 2013-06-23 LAB — CBC WITH DIFFERENTIAL/PLATELET
BASO%: 0.2 % (ref 0.0–2.0)
Basophils Absolute: 0 10*3/uL (ref 0.0–0.1)
EOS%: 1.1 % (ref 0.0–7.0)
Eosinophils Absolute: 0 10*3/uL (ref 0.0–0.5)
HEMATOCRIT: 27.7 % — AB (ref 38.4–49.9)
HGB: 9.6 g/dL — ABNORMAL LOW (ref 13.0–17.1)
LYMPH%: 55.6 % — AB (ref 14.0–49.0)
MCH: 37.1 pg — ABNORMAL HIGH (ref 27.2–33.4)
MCHC: 34.6 g/dL (ref 32.0–36.0)
MCV: 107.1 fL — ABNORMAL HIGH (ref 79.3–98.0)
MONO#: 0 10*3/uL — ABNORMAL LOW (ref 0.1–0.9)
MONO%: 1.3 % (ref 0.0–14.0)
NEUT%: 41.8 % (ref 39.0–75.0)
NEUTROS ABS: 0.6 10*3/uL — AB (ref 1.5–6.5)
Platelets: 96 10*3/uL — ABNORMAL LOW (ref 140–400)
RBC: 2.58 10*6/uL — AB (ref 4.20–5.82)
RDW: 23.4 % — ABNORMAL HIGH (ref 11.0–14.6)
WBC: 1.5 10*3/uL — ABNORMAL LOW (ref 4.0–10.3)
lymph#: 0.8 10*3/uL — ABNORMAL LOW (ref 0.9–3.3)

## 2013-06-23 LAB — HOLD TUBE, BLOOD BANK

## 2013-06-23 MED ORDER — DARBEPOETIN ALFA-POLYSORBATE 300 MCG/0.6ML IJ SOLN
300.0000 ug | Freq: Once | INTRAMUSCULAR | Status: AC
  Administered 2013-06-23: 300 ug via SUBCUTANEOUS
  Filled 2013-06-23: qty 0.6

## 2013-06-26 ENCOUNTER — Ambulatory Visit (HOSPITAL_COMMUNITY)
Admission: RE | Admit: 2013-06-26 | Discharge: 2013-06-26 | Disposition: A | Discharge status: Home / Self Care | Payer: Medicare Other | Source: Ambulatory Visit | Attending: Oncology | Admitting: Oncology

## 2013-06-26 DIAGNOSIS — D469 Myelodysplastic syndrome, unspecified: Secondary | ICD-10-CM | POA: Insufficient documentation

## 2013-06-26 DIAGNOSIS — D649 Anemia, unspecified: Secondary | ICD-10-CM | POA: Insufficient documentation

## 2013-07-06 ENCOUNTER — Telehealth: Payer: Self-pay | Admitting: Cardiology

## 2013-07-06 ENCOUNTER — Ambulatory Visit (INDEPENDENT_AMBULATORY_CARE_PROVIDER_SITE_OTHER): Payer: Medicare Other | Admitting: *Deleted

## 2013-07-06 DIAGNOSIS — I4891 Unspecified atrial fibrillation: Secondary | ICD-10-CM

## 2013-07-06 DIAGNOSIS — I442 Atrioventricular block, complete: Secondary | ICD-10-CM

## 2013-07-06 LAB — MDC_IDC_ENUM_SESS_TYPE_REMOTE
Brady Statistic AP VP Percent: 1 %
Brady Statistic AS VP Percent: 98 %
Brady Statistic AS VS Percent: 1 %
Date Time Interrogation Session: 20150611153623
Lead Channel Impedance Value: 441 Ohm
Lead Channel Impedance Value: 454 Ohm
Lead Channel Pacing Threshold Amplitude: 0.625 V
Lead Channel Pacing Threshold Amplitude: 1.5 V
Lead Channel Pacing Threshold Pulse Width: 0.4 ms
Lead Channel Pacing Threshold Pulse Width: 0.4 ms
Lead Channel Setting Sensing Sensitivity: 4 mV
MDC IDC MSMT BATTERY IMPEDANCE: 100 Ohm
MDC IDC MSMT BATTERY REMAINING LONGEVITY: 129 mo
MDC IDC MSMT BATTERY VOLTAGE: 2.78 V
MDC IDC MSMT LEADCHNL RA SENSING INTR AMPL: 0.7 mV
MDC IDC SET LEADCHNL RA PACING AMPLITUDE: 3 V
MDC IDC SET LEADCHNL RV PACING AMPLITUDE: 2.5 V
MDC IDC SET LEADCHNL RV PACING PULSEWIDTH: 0.4 ms
MDC IDC STAT BRADY AP VS PERCENT: 0 %

## 2013-07-06 NOTE — Telephone Encounter (Signed)
LMOVM reminding pt to send remote transmission.   

## 2013-07-06 NOTE — Progress Notes (Signed)
Remote pacemaker transmission.   

## 2013-07-07 ENCOUNTER — Other Ambulatory Visit: Payer: Self-pay | Admitting: *Deleted

## 2013-07-07 ENCOUNTER — Other Ambulatory Visit (HOSPITAL_BASED_OUTPATIENT_CLINIC_OR_DEPARTMENT_OTHER): Payer: Medicare Other

## 2013-07-07 ENCOUNTER — Ambulatory Visit (HOSPITAL_BASED_OUTPATIENT_CLINIC_OR_DEPARTMENT_OTHER): Payer: Medicare Other

## 2013-07-07 VITALS — BP 126/79 | HR 119 | Temp 98.2°F

## 2013-07-07 DIAGNOSIS — D61818 Other pancytopenia: Secondary | ICD-10-CM

## 2013-07-07 DIAGNOSIS — D469 Myelodysplastic syndrome, unspecified: Secondary | ICD-10-CM

## 2013-07-07 DIAGNOSIS — D649 Anemia, unspecified: Secondary | ICD-10-CM

## 2013-07-07 LAB — CBC WITH DIFFERENTIAL/PLATELET
BASO%: 0.5 % (ref 0.0–2.0)
Basophils Absolute: 0 10*3/uL (ref 0.0–0.1)
EOS%: 1.7 % (ref 0.0–7.0)
Eosinophils Absolute: 0 10*3/uL (ref 0.0–0.5)
HCT: 27.1 % — ABNORMAL LOW (ref 38.4–49.9)
HGB: 9.3 g/dL — ABNORMAL LOW (ref 13.0–17.1)
LYMPH%: 61.8 % — AB (ref 14.0–49.0)
MCH: 37.1 pg — ABNORMAL HIGH (ref 27.2–33.4)
MCHC: 34.4 g/dL (ref 32.0–36.0)
MCV: 108 fL — AB (ref 79.3–98.0)
MONO#: 0 10*3/uL — AB (ref 0.1–0.9)
MONO%: 0.8 % (ref 0.0–14.0)
NEUT#: 0.5 10*3/uL — CL (ref 1.5–6.5)
NEUT%: 35.2 % — ABNORMAL LOW (ref 39.0–75.0)
Platelets: 100 10*3/uL — ABNORMAL LOW (ref 140–400)
RBC: 2.51 10*6/uL — AB (ref 4.20–5.82)
RDW: 23 % — ABNORMAL HIGH (ref 11.0–14.6)
WBC: 1.3 10*3/uL — AB (ref 4.0–10.3)
lymph#: 0.8 10*3/uL — ABNORMAL LOW (ref 0.9–3.3)

## 2013-07-07 MED ORDER — DARBEPOETIN ALFA-POLYSORBATE 300 MCG/0.6ML IJ SOLN
300.0000 ug | Freq: Once | INTRAMUSCULAR | Status: AC
  Administered 2013-07-07: 300 ug via SUBCUTANEOUS
  Filled 2013-07-07: qty 0.6

## 2013-07-19 ENCOUNTER — Other Ambulatory Visit: Payer: Self-pay | Admitting: Internal Medicine

## 2013-07-19 ENCOUNTER — Other Ambulatory Visit: Payer: Self-pay | Admitting: Adult Health

## 2013-07-21 ENCOUNTER — Ambulatory Visit (HOSPITAL_BASED_OUTPATIENT_CLINIC_OR_DEPARTMENT_OTHER): Payer: Medicare Other

## 2013-07-21 ENCOUNTER — Ambulatory Visit (HOSPITAL_BASED_OUTPATIENT_CLINIC_OR_DEPARTMENT_OTHER): Payer: Medicare Other | Admitting: Oncology

## 2013-07-21 ENCOUNTER — Encounter: Payer: Self-pay | Admitting: Oncology

## 2013-07-21 ENCOUNTER — Telehealth: Payer: Self-pay | Admitting: Oncology

## 2013-07-21 ENCOUNTER — Other Ambulatory Visit: Payer: Medicare Other

## 2013-07-21 VITALS — BP 115/74 | HR 69 | Temp 98.1°F | Resp 20

## 2013-07-21 VITALS — BP 128/65 | HR 95 | Temp 97.9°F | Resp 18 | Ht 71.0 in | Wt 231.4 lb

## 2013-07-21 DIAGNOSIS — D61818 Other pancytopenia: Secondary | ICD-10-CM

## 2013-07-21 DIAGNOSIS — D469 Myelodysplastic syndrome, unspecified: Secondary | ICD-10-CM

## 2013-07-21 DIAGNOSIS — D508 Other iron deficiency anemias: Secondary | ICD-10-CM

## 2013-07-21 DIAGNOSIS — C8589 Other specified types of non-Hodgkin lymphoma, extranodal and solid organ sites: Secondary | ICD-10-CM

## 2013-07-21 DIAGNOSIS — D472 Monoclonal gammopathy: Secondary | ICD-10-CM

## 2013-07-21 LAB — CBC WITH DIFFERENTIAL/PLATELET
BASO%: 0.6 % (ref 0.0–2.0)
BASOS ABS: 0 10*3/uL (ref 0.0–0.1)
EOS%: 0.6 % (ref 0.0–7.0)
Eosinophils Absolute: 0 10*3/uL (ref 0.0–0.5)
HCT: 25.9 % — ABNORMAL LOW (ref 38.4–49.9)
HEMOGLOBIN: 8.9 g/dL — AB (ref 13.0–17.1)
LYMPH%: 60.4 % — AB (ref 14.0–49.0)
MCH: 36.9 pg — ABNORMAL HIGH (ref 27.2–33.4)
MCHC: 34.4 g/dL (ref 32.0–36.0)
MCV: 107.5 fL — ABNORMAL HIGH (ref 79.3–98.0)
MONO#: 0 10*3/uL — AB (ref 0.1–0.9)
MONO%: 1.3 % (ref 0.0–14.0)
NEUT%: 37.1 % — ABNORMAL LOW (ref 39.0–75.0)
NEUTROS ABS: 0.6 10*3/uL — AB (ref 1.5–6.5)
PLATELETS: 94 10*3/uL — AB (ref 140–400)
RBC: 2.41 10*6/uL — ABNORMAL LOW (ref 4.20–5.82)
RDW: 19.1 % — ABNORMAL HIGH (ref 11.0–14.6)
WBC: 1.5 10*3/uL — AB (ref 4.0–10.3)
lymph#: 0.9 10*3/uL (ref 0.9–3.3)

## 2013-07-21 LAB — PREPARE RBC (CROSSMATCH)

## 2013-07-21 LAB — HOLD TUBE, BLOOD BANK

## 2013-07-21 MED ORDER — ACETAMINOPHEN 325 MG PO TABS
650.0000 mg | ORAL_TABLET | Freq: Once | ORAL | Status: AC
  Administered 2013-07-21: 650 mg via ORAL

## 2013-07-21 MED ORDER — DARBEPOETIN ALFA-POLYSORBATE 300 MCG/0.6ML IJ SOLN
300.0000 ug | Freq: Once | INTRAMUSCULAR | Status: AC
Start: 2013-07-21 — End: 2013-07-21
  Administered 2013-07-21: 300 ug via SUBCUTANEOUS
  Filled 2013-07-21: qty 0.6

## 2013-07-21 MED ORDER — ACETAMINOPHEN 325 MG PO TABS
ORAL_TABLET | ORAL | Status: AC
  Filled 2013-07-21: qty 2

## 2013-07-21 MED ORDER — DIPHENHYDRAMINE HCL 25 MG PO CAPS
25.0000 mg | ORAL_CAPSULE | Freq: Once | ORAL | Status: AC
  Administered 2013-07-21: 25 mg via ORAL

## 2013-07-21 MED ORDER — DIPHENHYDRAMINE HCL 25 MG PO CAPS
ORAL_CAPSULE | ORAL | Status: AC
  Filled 2013-07-21: qty 1

## 2013-07-21 MED ORDER — SODIUM CHLORIDE 0.9 % IV SOLN
250.0000 mL | Freq: Once | INTRAVENOUS | Status: AC
  Administered 2013-07-21: 250 mL via INTRAVENOUS

## 2013-07-21 NOTE — Progress Notes (Signed)
Hematology and Oncology Follow Up Visit  [name] [id number] 1932-08-01 80 y.o. 07/21/2013 8:29 AM  CC: [name]. [name], MD, FCCP    Principle Diagnosis: A 80 year old gentleman with the following diagnoses:   1. Follicular lymphoma, grade 3, stage III, diagnosed 2007. Continued to be in remission. 2. Pancytopenia associated with mild neutropenia and macrocytosis possibly indicating early myelodysplasia. Bone marrow  was repeated on 06/22/2012 confirmed the evidence of myelodysplastic syndrome 3. Monoclonal gammopathy of undetermined significance.  Prior Therapy:  1. Status post CHOP and rituximab.  He had complete response to therapy concluded in 2007.  No further imaging has been indicated at this time. 2. Status post bone marrow biopsy in April 2011, did not really show any evidence of myelodysplasia or metastatic lymphoma at this time.  Current therapy: Aranesp 300 mcg every 2weeks to keep his hemoglobin close to 11. He also has been receiving intermittent PRBC transfusions.  Interim History: [name] presents today for a followup visit with his daughter. Since his last visit, he has been doing fair for the most part. He is S/P cataract surgery and his vision has improved. He denies any bleeding or bruising. Denied any fever or chills.  No further bleeding episodes noted since hospital D/C in December of 2014. He is reporting less fatigue and less decline in his health since his last visit. He reports no chest pain but continues to have some dyspnea on exertion. Denies any infection. He does not report any dysphagia.  He does not report any lymphadenopathy. Denies night sweats. Overall performance status, activity level is relatively stable. He does report some slight fatigue at this time but no syncope or falls. He uses a cane to ambulate at this time. He did not report any headaches or change in his vision. Does not report any seizures or confusion. He does not report any  palpitation or orthopnea. Has not reported any edema. Does not report any wheezing but still have exertional dyspnea. Is not reporting any abdominal pain but his appetite had been down slightly and lost about 3 pounds. He does not report any lymphadenopathy or petechiae. Has not reported any skin rashes or genitourinary complaints. Rest of review of systems unremarkable.   Medications: I have reviewed the patient's current medications Current Outpatient Prescriptions  Medication Sig Dispense Refill  . acetaminophen (TYLENOL) 500 MG tablet 1-2 every 4-6 hours as needed for headache.      . albuterol (PROVENTIL HFA;VENTOLIN HFA) 108 (90 BASE) MCG/ACT inhaler 1-2 puffs every 4-6 hours as needed for wheezing/shortness of breath      . aspirin 81 MG tablet Take 81 mg by mouth every morning. (hold if bleeding)      . calcium-vitamin D (OS-CAL 500 + D) 500-200 MG-UNIT per tablet Take 1 tablet by mouth 2 (two) times daily.       . cetirizine (ZYRTEC) 10 MG tablet Take 10 mg by mouth at bedtime as needed for allergies.      . Cholecalciferol (VITAMIN D) 1000 UNITS capsule Take 1,000 Units by mouth 2 (two) times daily.        [name] dextromethorphan-guaiFENesin (MUCINEX DM) 30-600 MG per 12 hr tablet Take 1-2 tablets by mouth every 12 (twelve) hours as needed for cough.       . docusate sodium (COLACE) 100 MG capsule Take 100 mg by mouth at bedtime.      . famotidine (PEPCID) 20 MG tablet Take 20 mg by mouth at bedtime.       [name]  finasteride (PROSCAR) 5 MG tablet Take 5 mg by mouth every morning.       . furosemide (LASIX) 40 MG tablet TAKE 1 TABLET DAILY. MAY TAKE 1 EXTRA TABLET DAILY IF NEEDED FOR SWELLING IN LEGS  30 tablet  1  . hydrocortisone cream 1 % Apply 1 application topically 2 (two) times daily as needed (itchy rash).       . hydrOXYzine (ATARAX/VISTARIL) 25 MG tablet 1-2 tabs by mouth every 4-6 hours as needed for itching      . levothyroxine (SYNTHROID, LEVOTHROID) 25 MCG tablet TAKE 1 TABLET (25 MCG  TOTAL) BY MOUTH DAILY BEFORE BREAKFAST.  30 tablet  5  . metFORMIN (GLUCOPHAGE) 500 MG tablet TAKE 1 TABLET BY MOUTH TWICE DAILY WITH FOOD  60 tablet  5  . methylcellulose (CITRUCEL) oral powder Take 1 packet by mouth at bedtime.      . mometasone (NASONEX) 50 MCG/ACT nasal spray 0-2 puffs each nostril in the morning, 1-2 puffs at bedtime      . mometasone-formoterol (DULERA) 200-5 MCG/ACT AERO Inhale 2 puffs into the lungs 2 (two) times daily.      . Multiple Vitamin (MULTIVITAMIN) tablet Take 1 tablet by mouth daily.        [name] omeprazole (PRILOSEC) 20 MG capsule 2 capsules by mouth every morning before breakfast      . oxybutynin (DITROPAN-XL) 10 MG 24 hr tablet Take 10 mg by mouth daily.      . polyethylene glycol powder (MIRALAX) powder Take 17 g by mouth at bedtime.       . potassium chloride SA (K-DUR,KLOR-CON) 20 MEQ tablet Take 20 mEq by mouth daily.      . sulindac (CLINORIL) 200 MG tablet TAKE 1 TABLET BY MOUTH TWICE A DAY AS NEEDED FOR ARTHRITIS PAIN      . traMADol (ULTRAM) 50 MG tablet Add 1 every 4 hours as needed for severe cough or pain      . traZODone (DESYREL) 150 MG tablet Take 1/3 tablet by mouth at bedtime       No current facility-administered medications for this visit.   Facility-Administered Medications Ordered in Other Visits  Medication Dose Route Frequency Provider Last Rate Last Dose  . darbepoetin (ARANESP) injection 300 mcg  300 mcg Subcutaneous Once Maryanna Shape, NP         Allergies: No Known Allergies  Past Medical History, Surgical history, Social history, and Family History were reviewed and updated.    Physical Exam: Blood pressure 128/65, pulse 95, temperature 97.9 F (36.6 C), temperature source Oral, resp. rate 18, height _0  (1.803 m), weight 231 lb 6.4 oz (104.962 kg), SpO2 98.00%. ECOG: 1 General appearance: alert, wearing sunglasses for protection related to his recent cataract surgery. Not in any distress Head: Normocephalic,  without obvious abnormality, atraumatic Neck: no adenopathy Lymph nodes: Cervical, supraclavicular, and axillary nodes normal. Heart:regular rate and rhythm, S1, S2 normal, no murmur, click, rub or gallop Lung:chest clear, no wheezing, rales, normal symmetric air entry Abdomen: soft, non-tender, without masses or organomegaly EXT:no erythema, induration, or nodules  Lab Results: Lab Results  Component Value Date   WBC 1.3* 07/07/2013   HGB 9.3* 07/07/2013   HCT 27.1* 07/07/2013   MCV 108.0* 07/07/2013   PLT 100* 07/07/2013     Chemistry      Component Value Date/Time   NA 137 06/14/2013 1108   NA 137 01/05/2013 1501   K 3.8 06/14/2013 1108   K  3.8 01/05/2013 1501   CL 100 06/14/2013 1108   CL 99 06/08/2012 1257   CO2 31 06/14/2013 1108   CO2 26 01/05/2013 1501   BUN 17 06/14/2013 1108   BUN 13.9 01/05/2013 1501   CREATININE 0.9 06/14/2013 1108   CREATININE 0.8 01/05/2013 1501      Component Value Date/Time   CALCIUM 9.8 06/14/2013 1108   CALCIUM 10.1 01/05/2013 1501   ALKPHOS 82 12/06/2012 1850   ALKPHOS 48 08/10/2012 1117   AST 17 12/06/2012 1850   AST 17 08/10/2012 1117   ALT <5 12/06/2012 1850   ALT 14 08/10/2012 1117   BILITOT 0.7 12/06/2012 1850   BILITOT 0.82 08/10/2012 1117      Impression and Plan:  This is a 81 year old gentleman with the following issues:  1. Follicular lymphoma diagnosed in 2007. He has no evidence to suggest recurrent disease. 2. Pancytopenia due to myelodysplastic syndrome. He is status post bone marrow biopsy was done on 06/22/2012.  He is to continue on Aranesp 300 mcg every 2 weeks. His hemoglobin is 9 today but he still have a few symptoms. He is agreeable to proceed with one unit of packed red cell transfusion and will assess him every 2 weeks. 3. Monoclonal gammopathy.  His M-spike had been less than 1 g/dL, his bone marrow biopsy continued to show a less than 8% plasma cells indicating most likely multiple myeloma.   4. Followup every 2 weeks  for an injection possible transfusion. He'll have a clinical visit in 6 weeks.  [facility], MD 6/26/20158:29 AM

## 2013-07-21 NOTE — Telephone Encounter (Signed)
gv adn printed appt sched and avs for pt for July adn Aug....sed added tx. °

## 2013-07-21 NOTE — Patient Instructions (Addendum)
Blood Transfusion Information WHAT IS A BLOOD TRANSFUSION? A transfusion is the replacement of blood or some of its parts. Blood is made up of multiple cells which provide different functions.  Red blood cells carry oxygen and are used for blood loss replacement.  White blood cells fight against infection.  Platelets control bleeding.  Plasma helps clot blood.  Other blood products are available for specialized needs, such as hemophilia or other clotting disorders. BEFORE THE TRANSFUSION  Who gives blood for transfusions?   You may be able to donate blood to be used at a later date on yourself (autologous donation).  Relatives can be asked to donate blood. This is generally not any safer than if you have received blood from a stranger. The same precautions are taken to ensure safety when a relative's blood is donated.  Healthy volunteers who are fully evaluated to make sure their blood is safe. This is blood bank blood. Transfusion therapy is the safest it has ever been in the practice of medicine. Before blood is taken from a donor, a complete history is taken to make sure that person has no history of diseases nor engages in risky social behavior (examples are intravenous drug use or sexual activity with multiple partners). The donor's travel history is screened to minimize risk of transmitting infections, such as malaria. The donated blood is tested for signs of infectious diseases, such as HIV and hepatitis. The blood is then tested to be sure it is compatible with you in order to minimize the chance of a transfusion reaction. If you or a relative donates blood, this is often done in anticipation of surgery and is not appropriate for emergency situations. It takes many days to process the donated blood. RISKS AND COMPLICATIONS Although transfusion therapy is very safe and saves many lives, the main dangers of transfusion include:   Getting an infectious disease.  Developing a  transfusion reaction. This is an allergic reaction to something in the blood you were given. Every precaution is taken to prevent this. The decision to have a blood transfusion has been considered carefully by your caregiver before blood is given. Blood is not given unless the benefits outweigh the risks. AFTER THE TRANSFUSION  Right after receiving a blood transfusion, you will usually feel much better and more energetic. This is especially true if your red blood cells have gotten low (anemic). The transfusion raises the level of the red blood cells which carry oxygen, and this usually causes an energy increase.  The nurse administering the transfusion will monitor you carefully for complications. HOME CARE INSTRUCTIONS  No special instructions are needed after a transfusion. You may find your energy is better. Speak with your caregiver about any limitations on activity for underlying diseases you may have. SEEK MEDICAL CARE IF:   Your condition is not improving after your transfusion.  You develop redness or irritation at the intravenous (IV) site. SEEK IMMEDIATE MEDICAL CARE IF:  Any of the following symptoms occur over the next 12 hours:  Shaking chills.  You have a temperature by mouth above 102 F (38.9 C), not controlled by medicine.  Chest, back, or muscle pain.  People around you feel you are not acting correctly or are confused.  Shortness of breath or difficulty breathing.  Dizziness and fainting.  You get a rash or develop hives.  You have a decrease in urine output.  Your urine turns a dark color or changes to pink, red, or brown. Any of the following   symptoms occur over the next 10 days:  You have a temperature by mouth above 102 F (38.9 C), not controlled by medicine.  Shortness of breath.  Weakness after normal activity.  The white part of the eye turns yellow (jaundice).  You have a decrease in the amount of urine or are urinating less often.  Your  urine turns a dark color or changes to pink, red, or brown. Document Released: 01/10/2000 Document Revised: 04/06/2011 Document Reviewed: 08/29/2007 [facility] Patient Information 2015 [place], [place]. This information is not intended to replace advice given to you by your health care provider. Make sure you discuss any questions you have with your health care provider.  Fall Prevention and Home Safety Falls cause injuries and can affect all age groups. It is possible to use preventive measures to significantly decrease the likelihood of falls. There are many simple measures which can make your home safer and prevent falls. OUTDOORS  Repair cracks and edges of walkways and driveways.  Remove high doorway thresholds.  Trim shrubbery on the main path into your home.  Have good outside lighting.  Clear walkways of tools, rocks, debris, and clutter.  Check that handrails are not broken and are securely fastened. Both sides of steps should have handrails.  Have leaves, snow, and ice cleared regularly.  Use sand or salt on walkways during winter months.  In the garage, clean up grease or oil spills. BATHROOM  Install night lights.  Install grab bars by the toilet and in the tub and shower.  Use non-skid mats or decals in the tub or shower.  Place a plastic non-slip stool in the shower to sit on, if needed.  Keep floors dry and clean up all water on the floor immediately.  Remove soap buildup in the tub or shower on a regular basis.  Secure bath mats with non-slip, double-sided rug tape.  Remove throw rugs and tripping hazards from the floors. BEDROOMS  Install night lights.  Make sure a bedside light is easy to reach.  Do not use oversized bedding.  Keep a telephone by your bedside.  Have a firm chair with side arms to use for getting dressed.  Remove throw rugs and tripping hazards from the floor. KITCHEN  Keep handles on pots and pans turned toward the center of the  stove. Use back burners when possible.  Clean up spills quickly and allow time for drying.  Avoid walking on wet floors.  Avoid hot utensils and knives.  Position shelves so they are not too high or low.  Place commonly used objects within easy reach.  If necessary, use a sturdy step stool with a grab bar when reaching.  Keep electrical cables out of the way.  Do not use floor polish or wax that makes floors slippery. If you must use wax, use non-skid floor wax.  Remove throw rugs and tripping hazards from the floor. STAIRWAYS  Never leave objects on stairs.  Place handrails on both sides of stairways and use them. Fix any loose handrails. Make sure handrails on both sides of the stairways are as long as the stairs.  Check carpeting to make sure it is firmly attached along stairs. Make repairs to worn or loose carpet promptly.  Avoid placing throw rugs at the top or bottom of stairways, or properly secure the rug with carpet tape to prevent slippage. Get rid of throw rugs, if possible.  Have an electrician put in a light switch at the top and bottom of  the stairs. OTHER FALL PREVENTION TIPS  Wear low-heel or rubber-soled shoes that are supportive and fit well. Wear closed toe shoes.  When using a stepladder, make sure it is fully opened and both spreaders are firmly locked. Do not climb a closed stepladder.  Add color or contrast paint or tape to grab bars and handrails in your home. Place contrasting color strips on first and last steps.  Learn and use mobility aids as needed. Install an electrical emergency response system.  Turn on lights to avoid dark areas. Replace light bulbs that burn out immediately. Get light switches that glow.  Arrange furniture to create clear pathways. Keep furniture in the same place.  Firmly attach carpet with non-skid or double-sided tape.  Eliminate uneven floor surfaces.  Select a carpet pattern that does not visually hide the edge of  steps.  Be aware of all pets. OTHER HOME SAFETY TIPS  Set the water temperature for 120 F (48.8 C).  Keep emergency numbers on or near the telephone.  Keep smoke detectors on every level of the home and near sleeping areas. Document Released: 01/02/2002 Document Revised: 07/14/2011 Document Reviewed: 04/03/2011 [facility] Patient Information 2015 [place], [place]. This information is not intended to replace advice given to you by your health care provider. Make sure you discuss any questions you have with your health care provider.   It has been a pleasure to serve you today!!

## 2013-07-22 LAB — TYPE AND SCREEN
ABO/RH(D): A POS
ANTIBODY SCREEN: NEGATIVE
Unit division: 0

## 2013-07-25 ENCOUNTER — Encounter: Payer: Self-pay | Admitting: Cardiology

## 2013-08-01 ENCOUNTER — Other Ambulatory Visit: Payer: Self-pay | Admitting: Internal Medicine

## 2013-08-03 ENCOUNTER — Other Ambulatory Visit: Payer: Self-pay | Admitting: *Deleted

## 2013-08-03 DIAGNOSIS — D649 Anemia, unspecified: Secondary | ICD-10-CM

## 2013-08-04 ENCOUNTER — Ambulatory Visit (HOSPITAL_BASED_OUTPATIENT_CLINIC_OR_DEPARTMENT_OTHER): Payer: Medicare Other

## 2013-08-04 ENCOUNTER — Other Ambulatory Visit (HOSPITAL_BASED_OUTPATIENT_CLINIC_OR_DEPARTMENT_OTHER): Payer: Medicare Other

## 2013-08-04 VITALS — BP 127/62 | HR 87 | Temp 98.3°F

## 2013-08-04 DIAGNOSIS — D649 Anemia, unspecified: Secondary | ICD-10-CM

## 2013-08-04 DIAGNOSIS — D469 Myelodysplastic syndrome, unspecified: Secondary | ICD-10-CM

## 2013-08-04 DIAGNOSIS — D61818 Other pancytopenia: Secondary | ICD-10-CM

## 2013-08-04 LAB — CBC WITH DIFFERENTIAL/PLATELET
BASO%: 0 % (ref 0.0–2.0)
BASOS ABS: 0 10*3/uL (ref 0.0–0.1)
EOS%: 0.7 % (ref 0.0–7.0)
Eosinophils Absolute: 0 10*3/uL (ref 0.0–0.5)
HEMATOCRIT: 28 % — AB (ref 38.4–49.9)
HGB: 9.5 g/dL — ABNORMAL LOW (ref 13.0–17.1)
LYMPH#: 0.8 10*3/uL — AB (ref 0.9–3.3)
LYMPH%: 60.4 % — ABNORMAL HIGH (ref 14.0–49.0)
MCH: 35.3 pg — AB (ref 27.2–33.4)
MCHC: 33.9 g/dL (ref 32.0–36.0)
MCV: 104.1 fL — AB (ref 79.3–98.0)
MONO#: 0 10*3/uL — ABNORMAL LOW (ref 0.1–0.9)
MONO%: 0.7 % (ref 0.0–14.0)
NEUT%: 38.2 % — AB (ref 39.0–75.0)
NEUTROS ABS: 0.5 10*3/uL — AB (ref 1.5–6.5)
Platelets: 80 10*3/uL — ABNORMAL LOW (ref 140–400)
RBC: 2.69 10*6/uL — ABNORMAL LOW (ref 4.20–5.82)
RDW: 20.5 % — ABNORMAL HIGH (ref 11.0–14.6)
WBC: 1.4 10*3/uL — ABNORMAL LOW (ref 4.0–10.3)
nRBC: 0 % (ref 0–0)

## 2013-08-04 MED ORDER — DARBEPOETIN ALFA-POLYSORBATE 300 MCG/0.6ML IJ SOLN
300.0000 ug | Freq: Once | INTRAMUSCULAR | Status: AC
  Administered 2013-08-04: 300 ug via SUBCUTANEOUS
  Filled 2013-08-04: qty 0.6

## 2013-08-18 ENCOUNTER — Other Ambulatory Visit: Payer: Self-pay | Admitting: *Deleted

## 2013-08-18 ENCOUNTER — Other Ambulatory Visit (HOSPITAL_BASED_OUTPATIENT_CLINIC_OR_DEPARTMENT_OTHER): Payer: Medicare Other

## 2013-08-18 ENCOUNTER — Ambulatory Visit: Payer: Medicare Other

## 2013-08-18 ENCOUNTER — Ambulatory Visit (HOSPITAL_BASED_OUTPATIENT_CLINIC_OR_DEPARTMENT_OTHER): Payer: Medicare Other

## 2013-08-18 VITALS — BP 118/71 | HR 94 | Temp 97.8°F

## 2013-08-18 DIAGNOSIS — D61818 Other pancytopenia: Secondary | ICD-10-CM

## 2013-08-18 DIAGNOSIS — D469 Myelodysplastic syndrome, unspecified: Secondary | ICD-10-CM

## 2013-08-18 LAB — CBC WITH DIFFERENTIAL/PLATELET
BASO%: 0 % (ref 0.0–2.0)
BASOS ABS: 0 10*3/uL (ref 0.0–0.1)
EOS%: 1.1 % (ref 0.0–7.0)
Eosinophils Absolute: 0 10*3/uL (ref 0.0–0.5)
HCT: 27.8 % — ABNORMAL LOW (ref 38.4–49.9)
HEMOGLOBIN: 9.6 g/dL — AB (ref 13.0–17.1)
LYMPH%: 63.5 % — AB (ref 14.0–49.0)
MCH: 36.1 pg — ABNORMAL HIGH (ref 27.2–33.4)
MCHC: 34.5 g/dL (ref 32.0–36.0)
MCV: 104.5 fL — AB (ref 79.3–98.0)
MONO#: 0 10*3/uL — AB (ref 0.1–0.9)
MONO%: 1.1 % (ref 0.0–14.0)
NEUT#: 0.6 10*3/uL — ABNORMAL LOW (ref 1.5–6.5)
NEUT%: 34.3 % — AB (ref 39.0–75.0)
NRBC: 1 % — AB (ref 0–0)
PLATELETS: 95 10*3/uL — AB (ref 140–400)
RBC: 2.66 10*6/uL — AB (ref 4.20–5.82)
RDW: 20.1 % — ABNORMAL HIGH (ref 11.0–14.6)
WBC: 1.8 10*3/uL — ABNORMAL LOW (ref 4.0–10.3)
lymph#: 1.1 10*3/uL (ref 0.9–3.3)

## 2013-08-18 LAB — HOLD TUBE, BLOOD BANK

## 2013-08-18 MED ORDER — DARBEPOETIN ALFA-POLYSORBATE 300 MCG/0.6ML IJ SOLN
300.0000 ug | Freq: Once | INTRAMUSCULAR | Status: AC
  Administered 2013-08-18: 300 ug via SUBCUTANEOUS
  Filled 2013-08-18: qty 0.6

## 2013-08-18 NOTE — Patient Instructions (Signed)
Darbepoetin Alfa injection What is this medicine? DARBEPOETIN ALFA (dar be POE e tin AL fa) helps your body make more red blood cells. It is used to treat anemia caused by chronic kidney failure and chemotherapy. This medicine may be used for other purposes; ask your health care provider or pharmacist if you have questions. COMMON BRAND NAME(S): Aranesp What should I tell my health care provider before I take this medicine? They need to know if you have any of these conditions: -blood clotting disorders or history of blood clots -cancer patient not on chemotherapy -cystic fibrosis -heart disease, such as angina, heart failure, or a history of a heart attack -hemoglobin level of 12 g/dL or greater -high blood pressure -low levels of folate, iron, or vitamin B12 -seizures -an unusual or allergic reaction to darbepoetin, erythropoietin, albumin, hamster proteins, latex, other medicines, foods, dyes, or preservatives -pregnant or trying to get pregnant -breast-feeding How should I use this medicine? This medicine is for injection into a vein or under the skin. It is usually given by a health care professional in a hospital or clinic setting. If you get this medicine at home, you will be taught how to prepare and give this medicine. Do not shake the solution before you withdraw a dose. Use exactly as directed. Take your medicine at regular intervals. Do not take your medicine more often than directed. It is important that you put your used needles and syringes in a special sharps container. Do not put them in a trash can. If you do not have a sharps container, call your pharmacist or healthcare provider to get one. Talk to your pediatrician regarding the use of this medicine in children. While this medicine may be used in children as young as 1 year for selected conditions, precautions do apply. Overdosage: If you think you have taken too much of this medicine contact a poison control center or  emergency room at once. NOTE: This medicine is only for you. Do not share this medicine with others. What if I miss a dose? If you miss a dose, take it as soon as you can. If it is almost time for your next dose, take only that dose. Do not take double or extra doses. What may interact with this medicine? Do not take this medicine with any of the following medications: -epoetin alfa This list may not describe all possible interactions. Give your health care provider a list of all the medicines, herbs, non-prescription drugs, or dietary supplements you use. Also tell them if you smoke, drink alcohol, or use illegal drugs. Some items may interact with your medicine. What should I watch for while using this medicine? Visit your prescriber or health care professional for regular checks on your progress and for the needed blood tests and blood pressure measurements. It is especially important for the doctor to make sure your hemoglobin level is in the desired range, to limit the risk of potential side effects and to give you the best benefit. Keep all appointments for any recommended tests. Check your blood pressure as directed. Ask your doctor what your blood pressure should be and when you should contact him or her. As your body makes more red blood cells, you may need to take iron, folic acid, or vitamin B supplements. Ask your doctor or health care provider which products are right for you. If you have kidney disease continue dietary restrictions, even though this medication can make you feel better. Talk with your doctor or health   care professional about the foods you eat and the vitamins that you take. What side effects may I notice from receiving this medicine? Side effects that you should report to your doctor or health care professional as soon as possible: -allergic reactions like skin rash, itching or hives, swelling of the face, lips, or tongue -breathing problems -changes in vision -chest  pain -confusion, trouble speaking or understanding -feeling faint or lightheaded, falls -high blood pressure -muscle aches or pains -pain, swelling, warmth in the leg -rapid weight gain -severe headaches -sudden numbness or weakness of the face, arm or leg -trouble walking, dizziness, loss of balance or coordination -seizures (convulsions) -swelling of the ankles, feet, hands -unusually weak or tired Side effects that usually do not require medical attention (report to your doctor or health care professional if they continue or are bothersome): -diarrhea -fever, chills (flu-like symptoms) -headaches -nausea, vomiting -redness, stinging, or swelling at site where injected This list may not describe all possible side effects. Call your doctor for medical advice about side effects. You may report side effects to FDA at 1-800-FDA-1088. Where should I keep my medicine? Keep out of the reach of children. Store in a refrigerator between 2 and 8 degrees C (36 and 46 degrees F). Do not freeze. Do not shake. Throw away any unused portion if using a single-dose vial. Throw away any unused medicine after the expiration date. NOTE: This sheet is a summary. It may not cover all possible information. If you have questions about this medicine, talk to your doctor, pharmacist, or health care provider.  2015, [employer]vier/[employer] Standard. (2007-12-27 10:23:57)  

## 2013-08-18 NOTE — Progress Notes (Signed)
No blood transfusion. Hgb: 9.6 per MD. Aranesp given.

## 2013-08-28 ENCOUNTER — Encounter: Payer: Medicare Other | Admitting: Adult Health

## 2013-08-28 ENCOUNTER — Ambulatory Visit (HOSPITAL_COMMUNITY)
Admission: RE | Admit: 2013-08-28 | Discharge: 2013-08-28 | Disposition: A | Discharge status: Home / Self Care | Payer: Medicare Other | Source: Ambulatory Visit | Attending: Oncology | Admitting: Oncology

## 2013-08-28 DIAGNOSIS — D539 Nutritional anemia, unspecified: Secondary | ICD-10-CM

## 2013-08-28 DIAGNOSIS — D61818 Other pancytopenia: Secondary | ICD-10-CM | POA: Insufficient documentation

## 2013-08-29 ENCOUNTER — Other Ambulatory Visit: Payer: Self-pay

## 2013-08-31 ENCOUNTER — Other Ambulatory Visit: Payer: Self-pay | Admitting: Internal Medicine

## 2013-09-01 ENCOUNTER — Other Ambulatory Visit (HOSPITAL_BASED_OUTPATIENT_CLINIC_OR_DEPARTMENT_OTHER): Payer: Medicare Other

## 2013-09-01 ENCOUNTER — Ambulatory Visit: Payer: Medicare Other

## 2013-09-01 ENCOUNTER — Other Ambulatory Visit: Payer: Self-pay | Admitting: Oncology

## 2013-09-01 ENCOUNTER — Ambulatory Visit (HOSPITAL_BASED_OUTPATIENT_CLINIC_OR_DEPARTMENT_OTHER): Payer: Medicare Other

## 2013-09-01 VITALS — BP 125/63 | HR 67 | Temp 97.7°F | Resp 14

## 2013-09-01 DIAGNOSIS — D539 Nutritional anemia, unspecified: Secondary | ICD-10-CM

## 2013-09-01 DIAGNOSIS — D61818 Other pancytopenia: Secondary | ICD-10-CM

## 2013-09-01 DIAGNOSIS — D469 Myelodysplastic syndrome, unspecified: Secondary | ICD-10-CM

## 2013-09-01 DIAGNOSIS — D649 Anemia, unspecified: Secondary | ICD-10-CM | POA: Diagnosis present

## 2013-09-01 LAB — CBC WITH DIFFERENTIAL/PLATELET
BASO%: 0.3 % (ref 0.0–2.0)
BASOS ABS: 0 10*3/uL (ref 0.0–0.1)
EOS%: 1 % (ref 0.0–7.0)
Eosinophils Absolute: 0 10*3/uL (ref 0.0–0.5)
HEMATOCRIT: 26.1 % — AB (ref 38.4–49.9)
HGB: 8.7 g/dL — ABNORMAL LOW (ref 13.0–17.1)
LYMPH%: 64.2 % — AB (ref 14.0–49.0)
MCH: 37.2 pg — ABNORMAL HIGH (ref 27.2–33.4)
MCHC: 33.5 g/dL (ref 32.0–36.0)
MCV: 111.1 fL — AB (ref 79.3–98.0)
MONO#: 0 10*3/uL — AB (ref 0.1–0.9)
MONO%: 1.1 % (ref 0.0–14.0)
NEUT%: 33.4 % — ABNORMAL LOW (ref 39.0–75.0)
NEUTROS ABS: 0.5 10*3/uL — AB (ref 1.5–6.5)
Platelets: 98 10*3/uL — ABNORMAL LOW (ref 140–400)
RBC: 2.35 10*6/uL — AB (ref 4.20–5.82)
RDW: 21.6 % — ABNORMAL HIGH (ref 11.0–14.6)
WBC: 1.4 10*3/uL — ABNORMAL LOW (ref 4.0–10.3)
lymph#: 0.9 10*3/uL (ref 0.9–3.3)

## 2013-09-01 LAB — PREPARE RBC (CROSSMATCH)

## 2013-09-01 LAB — HOLD TUBE, BLOOD BANK

## 2013-09-01 MED ORDER — DIPHENHYDRAMINE HCL 25 MG PO CAPS
ORAL_CAPSULE | ORAL | Status: AC
  Filled 2013-09-01: qty 1

## 2013-09-01 MED ORDER — DARBEPOETIN ALFA-POLYSORBATE 300 MCG/0.6ML IJ SOLN
300.0000 ug | Freq: Once | INTRAMUSCULAR | Status: AC
  Administered 2013-09-01: 300 ug via SUBCUTANEOUS
  Filled 2013-09-01: qty 0.6

## 2013-09-01 MED ORDER — SODIUM CHLORIDE 0.9 % IV SOLN
250.0000 mL | Freq: Once | INTRAVENOUS | Status: AC
  Administered 2013-09-01: 250 mL via INTRAVENOUS

## 2013-09-01 MED ORDER — DIPHENHYDRAMINE HCL 25 MG PO CAPS
25.0000 mg | ORAL_CAPSULE | Freq: Once | ORAL | Status: AC
  Administered 2013-09-01: 25 mg via ORAL

## 2013-09-01 MED ORDER — ACETAMINOPHEN 325 MG PO TABS
650.0000 mg | ORAL_TABLET | Freq: Once | ORAL | Status: AC
  Administered 2013-09-01: 650 mg via ORAL

## 2013-09-01 MED ORDER — ACETAMINOPHEN 325 MG PO TABS
ORAL_TABLET | ORAL | Status: AC
  Filled 2013-09-01: qty 2

## 2013-09-01 MED ORDER — SODIUM CHLORIDE 0.9 % IJ SOLN
3.0000 mL | INTRAMUSCULAR | Status: AC | PRN
  Administered 2013-09-01: 3 mL
  Filled 2013-09-01: qty 10

## 2013-09-01 NOTE — Patient Instructions (Signed)

## 2013-09-02 LAB — TYPE AND SCREEN
ABO/RH(D): A POS
Antibody Screen: NEGATIVE
UNIT DIVISION: 0
Unit division: 0

## 2013-09-14 ENCOUNTER — Telehealth: Payer: Self-pay | Admitting: Medical Oncology

## 2013-09-14 ENCOUNTER — Encounter: Payer: Medicare Other | Admitting: Adult Health

## 2013-09-14 ENCOUNTER — Other Ambulatory Visit: Payer: Self-pay | Admitting: Internal Medicine

## 2013-09-14 ENCOUNTER — Other Ambulatory Visit: Payer: Self-pay | Admitting: Oncology

## 2013-09-14 DIAGNOSIS — C8595 Non-Hodgkin lymphoma, unspecified, lymph nodes of inguinal region and lower limb: Secondary | ICD-10-CM

## 2013-09-14 NOTE — Telephone Encounter (Signed)
Daughter called requesting to have blood cultures drawn with patient's tomorrow lab appt. Daughter states patient "not feeling good, looks sickly, with upset stomach and having the same signs and symptoms as he did when he was getting septic back in October." Daughter states patient had 2 good days after blood transfusion on the 09/01/2013 and has declined after that. Denies fevers, but does state he has had some chills and increased fatigue.  Informed daughter will give message to Dr. [name] for review.  F/U appt 09/15/2013 lab/MD/blood trans/inj

## 2013-09-15 ENCOUNTER — Telehealth: Payer: Self-pay | Admitting: Oncology

## 2013-09-15 ENCOUNTER — Ambulatory Visit (HOSPITAL_BASED_OUTPATIENT_CLINIC_OR_DEPARTMENT_OTHER): Payer: Medicare Other

## 2013-09-15 ENCOUNTER — Other Ambulatory Visit (HOSPITAL_BASED_OUTPATIENT_CLINIC_OR_DEPARTMENT_OTHER): Payer: Medicare Other

## 2013-09-15 ENCOUNTER — Ambulatory Visit (HOSPITAL_BASED_OUTPATIENT_CLINIC_OR_DEPARTMENT_OTHER): Payer: Medicare Other | Admitting: Oncology

## 2013-09-15 ENCOUNTER — Ambulatory Visit: Payer: Medicare Other

## 2013-09-15 VITALS — BP 126/57 | HR 88 | Temp 98.6°F | Resp 18 | Ht 71.0 in | Wt 233.4 lb

## 2013-09-15 VITALS — BP 125/69 | HR 68 | Temp 97.9°F | Resp 18

## 2013-09-15 DIAGNOSIS — D472 Monoclonal gammopathy: Secondary | ICD-10-CM

## 2013-09-15 DIAGNOSIS — D649 Anemia, unspecified: Secondary | ICD-10-CM

## 2013-09-15 DIAGNOSIS — C8595 Non-Hodgkin lymphoma, unspecified, lymph nodes of inguinal region and lower limb: Secondary | ICD-10-CM

## 2013-09-15 DIAGNOSIS — D61818 Other pancytopenia: Secondary | ICD-10-CM

## 2013-09-15 DIAGNOSIS — C8589 Other specified types of non-Hodgkin lymphoma, extranodal and solid organ sites: Secondary | ICD-10-CM

## 2013-09-15 DIAGNOSIS — D469 Myelodysplastic syndrome, unspecified: Secondary | ICD-10-CM

## 2013-09-15 LAB — CBC WITH DIFFERENTIAL/PLATELET
BASO%: 0.3 % (ref 0.0–2.0)
Basophils Absolute: 0 10*3/uL (ref 0.0–0.1)
EOS%: 1 % (ref 0.0–7.0)
Eosinophils Absolute: 0 10*3/uL (ref 0.0–0.5)
HCT: 27.4 % — ABNORMAL LOW (ref 38.4–49.9)
HGB: 9.3 g/dL — ABNORMAL LOW (ref 13.0–17.1)
LYMPH#: 0.7 10*3/uL — AB (ref 0.9–3.3)
LYMPH%: 53.8 % — ABNORMAL HIGH (ref 14.0–49.0)
MCH: 36.3 pg — AB (ref 27.2–33.4)
MCHC: 33.8 g/dL (ref 32.0–36.0)
MCV: 107.2 fL — ABNORMAL HIGH (ref 79.3–98.0)
MONO#: 0 10*3/uL — AB (ref 0.1–0.9)
MONO%: 0.5 % (ref 0.0–14.0)
NEUT#: 0.6 10*3/uL — ABNORMAL LOW (ref 1.5–6.5)
NEUT%: 44.4 % (ref 39.0–75.0)
Platelets: 95 10*3/uL — ABNORMAL LOW (ref 140–400)
RBC: 2.56 10*6/uL — ABNORMAL LOW (ref 4.20–5.82)
RDW: 21.7 % — AB (ref 11.0–14.6)
WBC: 1.3 10*3/uL — AB (ref 4.0–10.3)

## 2013-09-15 LAB — HOLD TUBE, BLOOD BANK

## 2013-09-15 LAB — PREPARE RBC (CROSSMATCH)

## 2013-09-15 MED ORDER — ACETAMINOPHEN 325 MG PO TABS
650.0000 mg | ORAL_TABLET | Freq: Once | ORAL | Status: AC
Start: 2013-09-15 — End: 2013-09-15
  Administered 2013-09-15: 650 mg via ORAL

## 2013-09-15 MED ORDER — DIPHENHYDRAMINE HCL 25 MG PO CAPS
ORAL_CAPSULE | ORAL | Status: AC
  Filled 2013-09-15: qty 1

## 2013-09-15 MED ORDER — DIPHENHYDRAMINE HCL 25 MG PO CAPS
25.0000 mg | ORAL_CAPSULE | Freq: Once | ORAL | Status: AC
  Administered 2013-09-15: 25 mg via ORAL

## 2013-09-15 MED ORDER — ACETAMINOPHEN 325 MG PO TABS
ORAL_TABLET | ORAL | Status: AC
  Filled 2013-09-15: qty 2

## 2013-09-15 MED ORDER — SODIUM CHLORIDE 0.9 % IV SOLN
250.0000 mL | Freq: Once | INTRAVENOUS | Status: AC
  Administered 2013-09-15: 250 mL via INTRAVENOUS

## 2013-09-15 MED ORDER — DARBEPOETIN ALFA-POLYSORBATE 300 MCG/0.6ML IJ SOLN
300.0000 ug | Freq: Once | INTRAMUSCULAR | Status: AC
  Administered 2013-09-15: 300 ug via SUBCUTANEOUS
  Filled 2013-09-15: qty 0.6

## 2013-09-15 NOTE — Telephone Encounter (Signed)
gv adn printed appt sched and avs for pt for Sept thru NOV....sed added tx.

## 2013-09-15 NOTE — Progress Notes (Signed)
Hematology and Oncology Follow Up Visit  [name] [id number] 08-Dec-1932 80 y.o. 09/15/2013 10:09 AM  CC: [name]. [name], MD, FCCP    Principle Diagnosis: A 80 year old gentleman with the following diagnoses:   1. Follicular lymphoma, grade 3, stage III, diagnosed 2007. Continued to be in remission. 2. Pancytopenia associated with mild neutropenia and macrocytosis possibly indicating early myelodysplasia. Bone marrow  was repeated on 06/22/2012 confirmed the evidence of myelodysplastic syndrome 3. Monoclonal gammopathy of undetermined significance.  Prior Therapy:  1. Status post CHOP and rituximab.  He had complete response to therapy concluded in 2007.  No further imaging has been indicated at this time. 2. Status post bone marrow biopsy in April 2011, did not really show any evidence of myelodysplasia or metastatic lymphoma at this time.  Current therapy: Aranesp 300 mcg every 2weeks to keep his hemoglobin close to 11. He also has been receiving intermittent PRBC transfusions.  Interim History: Mr. Blass presents today for a followup visit with his daughter. Since his last visit, he has been the same for the most part. His daughter, she noticed some slight decline in him and some occasional chills and she was concerned about a possible bacteremia. However he personally denied any fever or chills.  No further bleeding episodes noted. He is reporting  fatigue but less decline in his health since his last visit. He reports no chest pain but continues to have some dyspnea on exertion. He does not report any dysphagia.  He does not report any lymphadenopathy. Denies night sweats. Overall performance status, activity level is relatively stable. He reports no syncope or falls. He uses a cane to ambulate at this time. He did not report any headaches or change in his vision. Does not report any seizures or confusion. He does not report any palpitation or orthopnea. Has not reported any edema.  Does not report any wheezing but still have exertional dyspnea. Is not reporting any abdominal pain but his appetite had been down slightly and lost about 3 pounds. He does not report any lymphadenopathy or petechiae. Has not reported any skin rashes or genitourinary complaints. Rest of review of systems unremarkable.   Medications: I have reviewed the patient's current medications Current Outpatient Prescriptions  Medication Sig Dispense Refill  . acetaminophen (TYLENOL) 500 MG tablet 1-2 every 4-6 hours as needed for headache.      . albuterol (PROVENTIL HFA;VENTOLIN HFA) 108 (90 BASE) MCG/ACT inhaler 1-2 puffs every 4-6 hours as needed for wheezing/shortness of breath      . aspirin 81 MG tablet Take 81 mg by mouth every morning. (hold if bleeding)      . calcium-vitamin D (OS-CAL 500 + D) 500-200 MG-UNIT per tablet Take 1 tablet by mouth 2 (two) times daily.       . cetirizine (ZYRTEC) 10 MG tablet Take 10 mg by mouth at bedtime as needed for allergies.      . Cholecalciferol (VITAMIN D) 1000 UNITS capsule Take 1,000 Units by mouth 2 (two) times daily.        [name] dextromethorphan-guaiFENesin (MUCINEX DM) 30-600 MG per 12 hr tablet Take 1-2 tablets by mouth every 12 (twelve) hours as needed for cough.       . docusate sodium (COLACE) 100 MG capsule Take 100 mg by mouth at bedtime.      . DULERA 200-5 MCG/ACT AERO INHALE 2 PUFFS FIRST THING EVERY MORNING AND THEN ANOTHER 2 PUFFS ABOUT 12 HOURS LATER  1 Inhaler  5  .  famotidine (PEPCID) 20 MG tablet Take 20 mg by mouth at bedtime.       . finasteride (PROSCAR) 5 MG tablet Take 5 mg by mouth every morning.       . furosemide (LASIX) 40 MG tablet TAKE 1 TABLET DAILY. MAY TAKE 1 EXTRA TABLET DAILY IF NEEDED FOR SWELLING IN LEGS  30 tablet  1  . hydrocortisone cream 1 % Apply 1 application topically 2 (two) times daily as needed (itchy rash).       . hydrOXYzine (ATARAX/VISTARIL) 25 MG tablet 1-2 tabs by mouth every 4-6 hours as needed for itching       . KLOR-CON M20 20 MEQ tablet TAKE 1 TABLET BY MOUTH EVERY DAY  30 tablet  2  . levothyroxine (SYNTHROID, LEVOTHROID) 25 MCG tablet TAKE 1 TABLET (25 MCG TOTAL) BY MOUTH DAILY BEFORE BREAKFAST.  30 tablet  5  . metFORMIN (GLUCOPHAGE) 500 MG tablet TAKE 1 TABLET BY MOUTH TWICE DAILY WITH FOOD  60 tablet  5  . methylcellulose (CITRUCEL) oral powder Take 1 packet by mouth at bedtime.      . mometasone (NASONEX) 50 MCG/ACT nasal spray 0-2 puffs each nostril in the morning, 1-2 puffs at bedtime      . Multiple Vitamin (MULTIVITAMIN) tablet Take 1 tablet by mouth daily.        [name] omeprazole (PRILOSEC) 20 MG capsule 2 capsules by mouth every morning before breakfast      . oxybutynin (DITROPAN-XL) 10 MG 24 hr tablet Take 10 mg by mouth daily.      . polyethylene glycol powder (MIRALAX) powder Take 17 g by mouth at bedtime.       [name] PROAIR HFA 108 (90 BASE) MCG/ACT inhaler INHALE 2 PUFFS BY MOUTH EVERY 6 HOURS AS NEEDED FOR SHORTNESS OF BREATH  8.5 each  2  . sulindac (CLINORIL) 200 MG tablet TAKE 1 TABLET BY MOUTH TWICE A DAY AS NEEDED FOR ARTHRITIS PAIN      . traMADol (ULTRAM) 50 MG tablet Add 1 every 4 hours as needed for severe cough or pain      . traZODone (DESYREL) 150 MG tablet Take 1/3 tablet by mouth at bedtime       No current facility-administered medications for this visit.     Allergies: No Known Allergies  Past Medical History, Surgical history, Social history, and Family History were reviewed and updated.    Physical Exam: Blood pressure 126/57, pulse 88, temperature 98.6 F (37 C), temperature source Oral, resp. rate 18, height 5' 11"  (1.803 m), weight 233 lb 6.4 oz (105.87 kg). ECOG: 1 General appearance: alert, slightly pale not in any distress. Head: Normocephalic, without obvious abnormality, atraumatic Neck: no adenopathy Lymph nodes: Cervical, supraclavicular, and axillary nodes normal. Heart:regular rate and rhythm, S1, S2 normal, no murmur, click, rub or  gallop Lung:chest clear, no wheezing, rales, normal symmetric air entry Abdomen: soft, non-tender, without masses or organomegaly EXT:no erythema, induration, or nodules  Lab Results: Lab Results  Component Value Date   WBC 1.3* 09/15/2013   HGB 9.3* 09/15/2013   HCT 27.4* 09/15/2013   MCV 107.2* 09/15/2013   PLT 95* 09/15/2013     Chemistry      Component Value Date/Time   NA 137 06/14/2013 1108   NA 137 01/05/2013 1501   K 3.8 06/14/2013 1108   K 3.8 01/05/2013 1501   CL 100 06/14/2013 1108   CL 99 06/08/2012 1257   CO2 31 06/14/2013 1108  CO2 26 01/05/2013 1501   BUN 17 06/14/2013 1108   BUN 13.9 01/05/2013 1501   CREATININE 0.9 06/14/2013 1108   CREATININE 0.8 01/05/2013 1501      Component Value Date/Time   CALCIUM 9.8 06/14/2013 1108   CALCIUM 10.1 01/05/2013 1501   ALKPHOS 82 12/06/2012 1850   ALKPHOS 48 08/10/2012 1117   AST 17 12/06/2012 1850   AST 17 08/10/2012 1117   ALT <5 12/06/2012 1850   ALT 14 08/10/2012 1117   BILITOT 0.7 12/06/2012 1850   BILITOT 0.82 08/10/2012 1117      Impression and Plan:  This is a 80 year old gentleman with the following issues:  1. Follicular lymphoma diagnosed in 2007. He has no evidence to suggest recurrent disease. 2. Pancytopenia due to myelodysplastic syndrome. He is status post bone marrow biopsy was done on 06/22/2012.  He is to continue on Aranesp 300 mcg every 2 weeks. His hemoglobin is 9.3 today but he still have a few symptoms. He is agreeable to proceed with one unit of packed red cell transfusion and will assess him every 2 weeks. 3. Monoclonal gammopathy.  His M-spike had been less than 1 g/dL, his bone marrow biopsy continued to show a less than 8% plasma cells indicating most likely multiple myeloma.   4. Followup every 2 weeks for an injection possible transfusion. He will have a clinical visit in 12 weeks.  [facility], MD 8/21/201510:09 AM

## 2013-09-15 NOTE — Progress Notes (Signed)
Aranesp inj scheduled for 09/15/13 given in tx room.

## 2013-09-15 NOTE — Patient Instructions (Signed)

## 2013-09-16 LAB — TYPE AND SCREEN
ABO/RH(D): A POS
Antibody Screen: NEGATIVE
UNIT DIVISION: 0

## 2013-09-19 ENCOUNTER — Emergency Department (HOSPITAL_COMMUNITY)
Admission: EM | Admit: 2013-09-19 | Discharge: 2013-09-20 | Disposition: A | Discharge status: Home / Self Care | Payer: Medicare Other | Attending: Emergency Medicine | Admitting: Emergency Medicine

## 2013-09-19 ENCOUNTER — Encounter (HOSPITAL_COMMUNITY): Payer: Self-pay | Admitting: Emergency Medicine

## 2013-09-19 ENCOUNTER — Ambulatory Visit (INDEPENDENT_AMBULATORY_CARE_PROVIDER_SITE_OTHER): Payer: Medicare Other | Admitting: Adult Health

## 2013-09-19 ENCOUNTER — Encounter: Payer: Self-pay | Admitting: Adult Health

## 2013-09-19 VITALS — BP 108/72 | HR 86 | Temp 97.9°F | Ht 71.0 in | Wt 234.8 lb

## 2013-09-19 DIAGNOSIS — Z950 Presence of cardiac pacemaker: Secondary | ICD-10-CM | POA: Insufficient documentation

## 2013-09-19 DIAGNOSIS — Z87898 Personal history of other specified conditions: Secondary | ICD-10-CM | POA: Diagnosis not present

## 2013-09-19 DIAGNOSIS — D649 Anemia, unspecified: Secondary | ICD-10-CM

## 2013-09-19 DIAGNOSIS — Z79899 Other long term (current) drug therapy: Secondary | ICD-10-CM | POA: Insufficient documentation

## 2013-09-19 DIAGNOSIS — M129 Arthropathy, unspecified: Secondary | ICD-10-CM | POA: Diagnosis not present

## 2013-09-19 DIAGNOSIS — E785 Hyperlipidemia, unspecified: Secondary | ICD-10-CM | POA: Diagnosis not present

## 2013-09-19 DIAGNOSIS — Z7982 Long term (current) use of aspirin: Secondary | ICD-10-CM | POA: Diagnosis not present

## 2013-09-19 DIAGNOSIS — R5381 Other malaise: Secondary | ICD-10-CM | POA: Diagnosis not present

## 2013-09-19 DIAGNOSIS — R079 Chest pain, unspecified: Secondary | ICD-10-CM | POA: Insufficient documentation

## 2013-09-19 DIAGNOSIS — E669 Obesity, unspecified: Secondary | ICD-10-CM | POA: Diagnosis not present

## 2013-09-19 DIAGNOSIS — R531 Weakness: Secondary | ICD-10-CM

## 2013-09-19 DIAGNOSIS — IMO0002 Reserved for concepts with insufficient information to code with codable children: Secondary | ICD-10-CM | POA: Diagnosis not present

## 2013-09-19 DIAGNOSIS — E119 Type 2 diabetes mellitus without complications: Secondary | ICD-10-CM | POA: Insufficient documentation

## 2013-09-19 DIAGNOSIS — R5383 Other fatigue: Secondary | ICD-10-CM

## 2013-09-19 DIAGNOSIS — J310 Chronic rhinitis: Secondary | ICD-10-CM

## 2013-09-19 DIAGNOSIS — I10 Essential (primary) hypertension: Secondary | ICD-10-CM

## 2013-09-19 DIAGNOSIS — Z87891 Personal history of nicotine dependence: Secondary | ICD-10-CM | POA: Insufficient documentation

## 2013-09-19 DIAGNOSIS — D61818 Other pancytopenia: Secondary | ICD-10-CM | POA: Diagnosis not present

## 2013-09-19 DIAGNOSIS — J45909 Unspecified asthma, uncomplicated: Secondary | ICD-10-CM

## 2013-09-19 DIAGNOSIS — E1365 Other specified diabetes mellitus with hyperglycemia: Secondary | ICD-10-CM

## 2013-09-19 DIAGNOSIS — I509 Heart failure, unspecified: Secondary | ICD-10-CM | POA: Insufficient documentation

## 2013-09-19 DIAGNOSIS — J4530 Mild persistent asthma, uncomplicated: Secondary | ICD-10-CM

## 2013-09-19 MED ORDER — ASPIRIN 325 MG PO TABS: 325.0000 mg | ORAL_TABLET | ORAL | Status: DC

## 2013-09-19 NOTE — Assessment & Plan Note (Signed)
Controlled on rx  Low salt diet  

## 2013-09-19 NOTE — Assessment & Plan Note (Signed)
Great control  ? Side effects from metformin  Will decrease dose to 500mg  1/2 Twice daily   Recheck labs in 3 month

## 2013-09-19 NOTE — Assessment & Plan Note (Signed)
Cont follow up with Hem/Onc

## 2013-09-19 NOTE — Assessment & Plan Note (Signed)
Controlled w/ recent epistaxis -hold nasonex for while  Use saline gel follow up with ENT as needed.

## 2013-09-19 NOTE — ED Notes (Signed)
Pt states he began having left sided chest pain with diaphoresis, lightheadedness, SOB, and weakness. Pt denies n/v. Pt also has a pacemaker. Pt in NAD during assessment.

## 2013-09-19 NOTE — Patient Instructions (Signed)
Decrease Metformin 500mg  1/2 Twice daily .  May try Activia yogurt daily .  Hold Nasonex for few weeks then use only As needed   Use saline nasal gel as directed and As needed   Follow med calendar closely and bring to each visit. Please contact office for sooner follow up if symptoms do not improve or worsen or seek emergency care   Follow up Dr. [name]  In 3 months and As needed

## 2013-09-19 NOTE — Assessment & Plan Note (Addendum)
Controlled on rx  Patient's medications were reviewed today and patient education was given. Computerized medication calendar was adjusted/completed

## 2013-09-19 NOTE — Progress Notes (Signed)
Subjective:     Patient ID: [name], male   DOB: 1932-02-24    MRN: [id number]  Brief patient profile:  64 yowm quit smoking 1970 with morbid obesity with boderline DM, Lymphoma s/p chemo ([facility]) and tendency to peripheral edema that is felt to be dependent and related to obesity/ venous insuff.   History of Present Illness  09/05/2012 acute  ov/Wert new problem = rash, breathing and cough resolved Chief Complaint  Patient presents with  . Acute Visit    Pt c/o rash and burning sensation left shoulder radiating down left arm- started to notice about a wk ago  initially though to be shingles but rash is actually bilateral upper arms and abd wall, itching rather than painful, no assoc fever or ha, arthralgias  >>refer to derm , vistaril rx      Admit date: 10/27/2012  Discharge date: 11/08/2012  Primary Care Physician: Christinia Gully, MD  Final Discharge Diagnoses:  Staph aureus bacteremia/sepsis of unclear etiology, likely with assumption that the pacemaker is contaminated  Secondary diagnosis . acute encephalopathy . Abdominal pain . Bacteremia due to Staphylococcus aureus . generalized debility  . Other pancytopenia . Hyponatremia . LYMPHOMA NEC, MLIG, INGUINAL/LOWER LIMB . Essential hypertension, benign . GERD . DM (diabetes mellitus), secondary uncontrolled      06/14/2013 f/u ov/Wert re: aodm/ asthma/hypothyroid Chief Complaint  Patient presents with  . Follow-up    Pt c/o incr SOB with some cough. Pt reports using candy to soothe throat to help reduce cough.      Not needing any saba - cough is dry and daytime not disturbing sleep, more of a throat clearing  >>Prevnar   09/19/2013 Follow Up  Returns for follow up and Med review .  We reviewed all his medications and organized them into a medication calendar with patient education. Patient appears to be take his medications correctly. Does report some dry cough since last ov.   Denies any dyspnea,  wheezing, tightness, f/c/s, n/v/d, hemoptysis, PND, leg swelling Labs last ov showed good DM control with A1C at 6.1.  Does not check sugars on regular basis . No known low sugars . Does have some loose stools on/off.  Does take miralax most nights-we discussed using this as needed only . Average control for last 6 months 5.7-6.1  We discussed lowering the metformin dose.  Patient continues to follow with hematology and on for pancytopenia, along with a myelodysplastic syndrome. Remains on Dulera , denies flare in cough or wheezing.  Gets winded easily , does wear out with walking.  He receives Aranesp every 2 weeks, and intermittent transfusions. Last blood transfusion was one week ago. Did have another nose bleed, seen by ENT , required cauterization . We discussed changing Nasonex to As needed  And using saline and saline nasal gel to help.     Current Medications, Allergies, Complete Past Medical History, Past Surgical History, Family History, and Social History were reviewed in [employer] record.  ROS  The following are not active complaints unless bolded sore throat, dysphagia, dental problems, itching, sneezing,  nasal congestion or excess/ purulent secretions, ear ache,   fever, chills, sweats, unintended wt loss, pleuritic or exertional cp, hemoptysis,  orthopnea pnd or leg swelling, presyncope, palpitations, heartburn, abdominal pain, anorexia, nausea, vomiting, diarrhea  or change in bowel or urinary habits, change in stools or urine, dysuria,hematuria,  rash, arthralgias, visual complaints, headache, numbness weakness generalized  or ataxia or problems with walking or  coordination,  change in mood/affect or memory.                           Past Medical History:   LYMPHOMA NEC, MLIG, INGUINAL/LOWER LIMB (ICD-202.85) dx 04/2005.......[facility]  - last chemo 10/2006 -repeat CT/ABD CT 11/18/07--no reccurence  - Pancytopenia August 06, 2009 > refer back  to [facility] > aranesp rx Jan 2012  RHINITIS, ALLERGIC NOS (ICD-477.9)  HYPERLIPIDEMIA NEC/NOS (ICD-272.4)  EXOGENOUS OBESITY (ICD-278.00)  - ideal body weight less than 186  AODM onset 2012 with freq pred for airways issues -HgA1C 5.5 12/12 >metformin decreased 500mg  1/2 Twice daily   Vitamin D Deficiency- level 29>>44 (4/9//10)  Left Hip pain onset around 6/09............................................................................   [facility]  - MRI 11/23/2007 c/w L1-2 bulging disc indents the thecal sac with foraminal stenosis  HEALTH MAINTENANCE..........................................................................................Marland KitchenWert  - Td 10/2005  - Pneumovax 10/07 second shot  - CPX 08/20/2010  --colon 02/2005 -int/extern. hems (repeat q7y)  Complex med regimen  --Meds reviewed with pt education and computerized med calendar completed/adjusted. November 08, 2008 , August 21, 2009 updated 02/19/2011 , 10/23/2011 , 09/19/2013  Syncope...........................................................................................................Marland KitchenHochrein  - Mobitz II AV block s/p Medtronic pacemaker placement 12/11/2008  D[facility]at[facility].....................................................................................................[facility]  - Pruritic rash 04/2009 > tried lotrisone, [facility] to [facility] August 06, 2009  Memory impairment , MMSE 27/30 8/25>refer to neuro   Family History:   mother had diabetes   Social History:  Patient states former smoker.  quit smoking in 1970  No ETOH  Retired            Objective:   Physical Exam wt 244 January 16, 2008 > 249 April 24, 2009 > 249 10/06/2010 >  11/27/2010  245 > 01/07/2011 241 > 02/19/11 238 > 06/04/2011  247 >07/03/2011 246 > 07/31/2011  248 > 09/11/2011  247 >246 10/23/2011 > 258 02/15/2012 > 05/19/2012  258 > 08/19/2012 255 >  266 09/05/12>  11/24/12 256>235 01/02/13 > 237  01/23/2013  237>>230 03/06/2013 >  06/14/13 234>234 09/19/2013   amb obese wm in no acute distress  HEENT: nl dentition and orophanx.  Nasal mucosa dry, Mucus membranes pink and moist. No tongue or throat exudate.  Neck: supple with full ROM. no JVD, node enlargement or TMJ   Lungs: clear and equal bilateral breath sounds. No wheezing, rhonchi or rales  Chest: RRR. No murmurs, rubs, or gallops.   Tr- peripheral edema.  Abd: obese but soft and no rebound or focal tenderness. Normal excursion in the supine position.  Ext warm and dry, no calf tenderness, cyanosis or clubbing. mild/mod chronic venous insufficiency changes. Varicose veins present. Neuro: nl sensorium and gait..  Skin:   No rash    12/30/12 cxr Left-sided pacemaker has been removed.  Interval placement of dual lead pacemaker from right-sided approach.  Right atrial and right ventricular leads in good position. No  pneumothorax  Cardiac enlargement without heart failure infiltrate or effusion.   Assessment:

## 2013-09-20 ENCOUNTER — Emergency Department (HOSPITAL_COMMUNITY): Payer: Medicare Other

## 2013-09-20 DIAGNOSIS — R079 Chest pain, unspecified: Secondary | ICD-10-CM | POA: Diagnosis not present

## 2013-09-20 LAB — URINALYSIS, ROUTINE W REFLEX MICROSCOPIC
BILIRUBIN URINE: NEGATIVE
Glucose, UA: NEGATIVE mg/dL
Ketones, ur: NEGATIVE mg/dL
Leukocytes, UA: NEGATIVE
Nitrite: NEGATIVE
PROTEIN: NEGATIVE mg/dL
Specific Gravity, Urine: 1.012 (ref 1.005–1.030)
Urobilinogen, UA: 0.2 mg/dL (ref 0.0–1.0)
pH: 6 (ref 5.0–8.0)

## 2013-09-20 LAB — I-STAT TROPONIN, ED
TROPONIN I, POC: 0.02 ng/mL (ref 0.00–0.08)
TROPONIN I, POC: 0.01 ng/mL (ref 0.00–0.08)

## 2013-09-20 LAB — BASIC METABOLIC PANEL
ANION GAP: 16 — AB (ref 5–15)
BUN: 17 mg/dL (ref 6–23)
CHLORIDE: 97 meq/L (ref 96–112)
CO2: 23 meq/L (ref 19–32)
CREATININE: 0.84 mg/dL (ref 0.50–1.35)
Calcium: 9.2 mg/dL (ref 8.4–10.5)
GFR calc non Af Amer: 81 mL/min — ABNORMAL LOW (ref >90)
Glucose, Bld: 156 mg/dL — ABNORMAL HIGH (ref 70–99)
POTASSIUM: 3.5 meq/L — AB (ref 3.7–5.3)
Sodium: 136 mEq/L — ABNORMAL LOW (ref 137–147)

## 2013-09-20 LAB — PRO B NATRIURETIC PEPTIDE: PRO B NATRI PEPTIDE: 994.5 pg/mL — AB (ref 0–450)

## 2013-09-20 LAB — CBC
HCT: 30 % — ABNORMAL LOW (ref 39.0–52.0)
Hemoglobin: 10.5 g/dL — ABNORMAL LOW (ref 13.0–17.0)
MCH: 35.6 pg — ABNORMAL HIGH (ref 26.0–34.0)
MCHC: 35 g/dL (ref 30.0–36.0)
MCV: 101.7 fL — ABNORMAL HIGH (ref 78.0–100.0)
PLATELETS: 82 10*3/uL — AB (ref 150–400)
RBC: 2.95 MIL/uL — AB (ref 4.22–5.81)
RDW: 19.6 % — ABNORMAL HIGH (ref 11.5–15.5)
WBC: 1.8 10*3/uL — AB (ref 4.0–10.5)

## 2013-09-20 LAB — URINE MICROSCOPIC-ADD ON

## 2013-09-20 NOTE — ED Notes (Signed)
X-ray at bedside

## 2013-09-20 NOTE — ED Provider Notes (Signed)
CSN: [id number]     Arrival date & time 09/19/13  2345 History   First MD Initiated Contact with Patient 09/19/13 2359     Chief Complaint  Patient presents with  . Chest Pain     (Consider location/radiation/quality/duration/timing/severity/associated sxs/prior Treatment) HPI 80 year old male presents to emergency room from home with complaint of left-sided chest pain this evening around 7 PM.  Patient reports rubbing his chest makes the pain better.  Family is concerned as he was very weak on his feet this evening.  They report that he has history of sepsis and infection to his pacemaker in December.  He reports this weakness is similar to the weakness he had with his prior infection.  Patient has history of pancytopenia secondary to lymphoma treatment, chronic venous insufficiency, paroxysmal A. fib with pacemaker, diabetes.  No prior history of coronary disease or MI.  Patient denies any fever chills, no night sweats.  Patient reports he has chronic fatigue and shortness of breath with walking.  Patient was seen by oncology this past Friday.  He received blood transfusion at that time due to symptomatic anemia.  Family reports that he had blood cultures drawn, but had been unable to find this in the system.  Patient was seen earlier today by his pulmonologist nurse practitioner.  No abdominal pain, no orthopnea, no focal weakness or numbness. Past Medical History  Diagnosis Date  . Allergic rhinitis   . Hyperlipidemia   . Exogenous obesity   . Edema     venous insufficiency  . Second degree Mobitz II AV block 12/11/08    with transient syncope, resolved s/p PPM  . Paroxysmal atrial fibrillation 03/16/11    diagnosed by PPM interrogation  . Pancytopenia   . Postural dizziness   . CHF (congestive heart failure)   . Pacemaker   . Dyspnea     "all the time w/the temporary pacer" (12/29/2012)  . Type II diabetes mellitus 2012  . History of blood transfusion     "3 recently" (12/29/2012)   . Daily headache     "recently w/the ATB" (12/29/2012)  . Arthritis     "hips; knees" (12/29/2012)  . 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    Past Surgical History  Procedure Laterality Date  . Lymph node biopsy  04/2005    groin  . Sternotomy  2000  . Pericardiectomy  2000  . Penile prosthesis implant      and removal  . Pacemaker insertion  12/11/08    MDT implanted by Dr Rayann Heman  . Tee without cardioversion N/A 10/31/2012    Procedure: TRANSESOPHAGEAL ECHOCARDIOGRAM (TEE);  Surgeon: Lelon Perla, MD;  Location: [facility] ENDOSCOPY;  Service: Cardiovascular;  Laterality: N/A;  . Pacemaker lead removal Left 11/04/2012    Procedure: PACEMAKER LEAD REMOVAL;  Surgeon: Evans Lance, MD;  Location: [place];  Service: Cardiovascular;  Laterality: Left;  . Temporary pacemaker insertion Left 11/04/2012    Procedure: TEMPORARY PACEMAKER INSERTION;  Surgeon: Evans Lance, MD;  Location: [place];  Service: Cardiovascular;  Laterality: Left;  . Insert / replace / remove pacemaker  12/29/2012   Family History  Problem Relation Age of Onset  . Diabetes Mother   . Liver cancer Brother   . Cancer Sister    History  Substance Use Topics  . Smoking status: Former Smoker -- 1.00 packs/day for 30 years    Types: Cigarettes  Quit date: 01/27/1968  . Smokeless tobacco: Former Systems developer    Types: Chew  . Alcohol Use: No    Review of Systems   See History of Present Illness; otherwise all other systems are reviewed and negative  Allergies  Review of patient's allergies indicates no known allergies.  Home Medications   Prior to Admission medications   Medication Sig Start Date End Date Taking? Authorizing Provider  acetaminophen (TYLENOL) 500 MG tablet Take 500-1,000 mg by mouth every 6 (six) hours as needed (for pain.).   Yes Historical Provider, MD  albuterol (PROVENTIL HFA;VENTOLIN HFA) 108 (90 BASE) MCG/ACT inhaler 1-2 puffs every 4-6  hours as needed for wheezing/shortness of breath   Yes Historical Provider, MD  aspirin 81 MG tablet Take 81 mg by mouth every morning. (hold if bleeding)   Yes Historical Provider, MD  calcium-vitamin D (OS-CAL 500 + D) 500-200 MG-UNIT per tablet Take 1 tablet by mouth 2 (two) times daily.    Yes Historical Provider, MD  cetirizine (ZYRTEC) 10 MG tablet Take 10 mg by mouth at bedtime as needed for allergies.   Yes Historical Provider, MD  Cholecalciferol (VITAMIN D) 1000 UNITS capsule Take 1,000 Units by mouth 2 (two) times daily.     Yes Historical Provider, MD  dextromethorphan-guaiFENesin (MUCINEX DM) 30-600 MG per 12 hr tablet Take 1-2 tablets by mouth every 12 (twelve) hours as needed for cough.  06/26/10  Yes Tammy S Parrett, NP  famotidine (PEPCID) 20 MG tablet Take 20 mg by mouth at bedtime.    Yes Historical Provider, MD  finasteride (PROSCAR) 5 MG tablet Take 5 mg by mouth every morning.    Yes Historical Provider, MD  furosemide (LASIX) 40 MG tablet Take 40 mg by mouth daily as needed (for fluid retention in legs).   Yes Historical Provider, MD  hydrocortisone cream 1 % Apply 1 application topically 2 (two) times daily as needed (itchy rash).    Yes Historical Provider, MD  hydrOXYzine (ATARAX/VISTARIL) 25 MG tablet Take 25-50 mg by mouth 3 (three) times daily as needed (for itching).   Yes Historical Provider, MD  levothyroxine (SYNTHROID, LEVOTHROID) 25 MCG tablet Take 25 mcg by mouth daily.   Yes Historical Provider, MD  metFORMIN (GLUCOPHAGE) 500 MG tablet Take 250 mg by mouth 2 (two) times daily.   Yes Historical Provider, MD  mometasone-formoterol (DULERA) 200-5 MCG/ACT AERO Inhale 2 puffs into the lungs 2 (two) times daily.   Yes Historical Provider, MD  Multiple Vitamin (MULTIVITAMIN) tablet Take 1 tablet by mouth daily.     Yes Historical Provider, MD  omeprazole (PRILOSEC) 20 MG capsule Take 40 mg by mouth every morning.    Yes Historical Provider, MD  oxybutynin (DITROPAN-XL) 10  MG 24 hr tablet Take 10 mg by mouth daily.   Yes Historical Provider, MD  polyethylene glycol powder (MIRALAX) powder Take 17 g by mouth daily as needed (for constipation).    Yes Historical Provider, MD  potassium chloride SA (K-DUR,KLOR-CON) 20 MEQ tablet Take 20 mEq by mouth daily.   Yes Historical Provider, MD  sulindac (CLINORIL) 200 MG tablet Take 200 mg by mouth 2 (two) times daily.   Yes Historical Provider, MD  traZODone (DESYREL) 150 MG tablet Take 50 mg by mouth at bedtime.   Yes Historical Provider, MD  docusate sodium (COLACE) 100 MG capsule Take 100 mg by mouth at bedtime.    Historical Provider, MD  methylcellulose (CITRUCEL) oral powder Take 1 packet by mouth at  bedtime.    Historical Provider, MD  mometasone (NASONEX) 50 MCG/ACT nasal spray Place 1-2 sprays into the nose at bedtime.     Tammy S Parrett, NP   BP 114/48  Pulse 91  Temp(Src) 99 F (37.2 C) (Oral)  Resp 23  SpO2 100% Physical Exam  Nursing note and vitals reviewed. Constitutional: He is oriented to person, place, and time. He appears well-developed and well-nourished.  Elderly frail male, chronically ill appearing  HENT:  Head: Normocephalic and atraumatic.  Right Ear: External ear normal.  Left Ear: External ear normal.  Nose: Nose normal.  Mouth/Throat: Oropharynx is clear and moist.  Eyes: Conjunctivae and EOM are normal. Pupils are equal, round, and reactive to light.  Neck: Normal range of motion. Neck supple. No JVD present. No tracheal deviation present. No thyromegaly present.  Cardiovascular: Normal rate, regular rhythm, normal heart sounds and intact distal pulses.  Exam reveals no gallop and no friction rub.   No murmur heard. Pulmonary/Chest: Effort normal and breath sounds normal. No stridor. No respiratory distress. He has no wheezes. He has no rales. He exhibits no tenderness.  Abdominal: Soft. Bowel sounds are normal. He exhibits no distension and no mass. There is no tenderness. There is no  rebound and no guarding.  Musculoskeletal: Normal range of motion. He exhibits edema (trace edema). He exhibits no tenderness.  Lymphadenopathy:    He has no cervical adenopathy.  Neurological: He is alert and oriented to person, place, and time. He has normal reflexes. No cranial nerve deficit. He exhibits normal muscle tone. Coordination normal.  Skin: Skin is warm and dry. No rash noted. No erythema. No pallor.  Psychiatric: He has a normal mood and affect. His behavior is normal. Judgment and thought content normal.    ED Course  Procedures (including critical care time) Labs Review Labs Reviewed  CBC - Abnormal; Notable for the following:    WBC 1.8 (*)    RBC 2.95 (*)    Hemoglobin 10.5 (*)    HCT 30.0 (*)    MCV 101.7 (*)    MCH 35.6 (*)    RDW 19.6 (*)    Platelets 82 (*)    All other components within normal limits  BASIC METABOLIC PANEL - Abnormal; Notable for the following:    Sodium 136 (*)    Potassium 3.5 (*)    Glucose, Bld 156 (*)    GFR calc non Af Amer 81 (*)    Anion gap 16 (*)    All other components within normal limits  PRO B NATRIURETIC PEPTIDE - Abnormal; Notable for the following:    Pro B Natriuretic peptide (BNP) 994.5 (*)    All other components within normal limits  CULTURE, BLOOD (ROUTINE X 2)  CULTURE, BLOOD (ROUTINE X 2)  URINE CULTURE  URINALYSIS, ROUTINE W REFLEX MICROSCOPIC  I-STAT TROPOININ, ED  Randolm Idol, ED    Imaging Review Dg Chest Port 1 View  09/20/2013   CLINICAL DATA:  Sudden onset chest pain.  EXAM: PORTABLE CHEST - 1 VIEW  COMPARISON:  12/30/2012  FINDINGS: Postoperative changes in the mediastinum. Cardiac pacemaker. Shallow inspiration. Cardiac enlargement with mild pulmonary vascular congestion. No edema. No focal consolidation. No blunting of costophrenic angles. No pneumothorax.  IMPRESSION: Cardiac enlargement with mild vascular congestion.   Electronically Signed   By: Lucienne Capers M.D.   On: 09/20/2013 00:24      EKG Interpretation None     EKG: paced rhythm at MDM  Final diagnoses:  Chest pain, unspecified chest pain type  Pancytopenia  Weakness   80 year old male with chest pain earlier this evening, and chronic weakness and dyspnea on exertion.  No signs of acute ischemia. Family's concern for underlying infection.  None found.  He does have his chronic pancytopenia with improved anemia since his blood transfusion on Friday.  Blood cultures were ordered and sent.  Urine and urine culture sent.  Patient to followup with his primary care Dr. and cardiologist.  Kalman Drape, MD 09/20/13 [phone]

## 2013-09-20 NOTE — Discharge Instructions (Signed)
Your workup today has not shown a specific cause for your symptoms.  Please contact your cardiologist for further workup.  Return to the ER for worsening condition or new concerning symptoms.   Chest Pain (Nonspecific) It is often hard to give a diagnosis for the cause of chest pain. There is always a chance that your pain could be related to something serious, such as a heart attack or a blood clot in the lungs. You need to follow up with your doctor. HOME CARE  If antibiotic medicine was given, take it as directed by your doctor. Finish the medicine even if you start to feel better.  For the next few days, avoid activities that bring on chest pain. Continue physical activities as told by your doctor.  Do not use any tobacco products. This includes cigarettes, chewing tobacco, and e-cigarettes.  Avoid drinking alcohol.  Only take medicine as told by your doctor.  Follow your doctor's suggestions for more testing if your chest pain does not go away.  Keep all doctor visits you made. GET HELP IF:  Your chest pain does not go away, even after treatment.  You have a rash with blisters on your chest.  You have a fever. GET HELP RIGHT AWAY IF:   You have more pain or pain that spreads to your arm, neck, jaw, back, or belly (abdomen).  You have shortness of breath.  You cough more than usual or cough up blood.  You have very bad back or belly pain.  You feel sick to your stomach (nauseous) or throw up (vomit).  You have very bad weakness.  You pass out (faint).  You have chills. This is an emergency. Do not wait to see if the problems will go away. Call your local emergency services (911 in [place] Do not drive yourself to the hospital. MAKE SURE YOU:   Understand these instructions.  Will watch your condition.  Will get help right away if you are not doing well or get worse. Document Released: 07/01/2007 Document Revised: 01/17/2013 Document Reviewed:  07/01/2007 [facility] Patient Information 2015 [place], [place]. This information is not intended to replace advice given to you by your health care provider. Make sure you discuss any questions you have with your health care provider.  Weakness Weakness is a lack of strength. It may be felt all over the body (generalized) or in one specific part of the body (focal). Some causes of weakness can be serious. You may need further medical evaluation, especially if you are elderly or you have a history of immunosuppression (such as chemotherapy or HIV), kidney disease, heart disease, or diabetes. CAUSES  Weakness can be caused by many different things, including:  Infection.  Physical exhaustion.  Internal bleeding or other blood loss that results in a lack of red blood cells (anemia).  Dehydration. This cause is more common in elderly people.  Side effects or electrolyte abnormalities from medicines, such as pain medicines or sedatives.  Emotional distress, anxiety, or depression.  Circulation problems, especially severe peripheral arterial disease.  Heart disease, such as rapid atrial fibrillation, bradycardia, or heart failure.  Nervous system disorders, such as Guillain-Barr syndrome, multiple sclerosis, or stroke. DIAGNOSIS  To find the cause of your weakness, your caregiver will take your history and perform a physical exam. Lab tests or X-rays may also be ordered, if needed. TREATMENT  Treatment of weakness depends on the cause of your symptoms and can vary greatly. HOME CARE INSTRUCTIONS   Rest as needed.  Eat a well-balanced diet.  Try to get some exercise every day.  Only take over-the-counter or prescription medicines as directed by your caregiver. SEEK MEDICAL CARE IF:   Your weakness seems to be getting worse or spreads to other parts of your body.  You develop new aches or pains. SEEK IMMEDIATE MEDICAL CARE IF:   You cannot perform your normal daily activities,  such as getting dressed and feeding yourself.  You cannot walk up and down stairs, or you feel exhausted when you do so.  You have shortness of breath or chest pain.  You have difficulty moving parts of your body.  You have weakness in only one area of the body or on only one side of the body.  You have a fever.  You have trouble speaking or swallowing.  You cannot control your bladder or bowel movements.  You have black or bloody vomit or stools. MAKE SURE YOU:  Understand these instructions.  Will watch your condition.  Will get help right away if you are not doing well or get worse. Document Released: 01/12/2005 Document Revised: 07/14/2011 Document Reviewed: 03/13/2011 [facility] Patient Information 2015 [place], [place]. This information is not intended to replace advice given to you by your health care provider. Make sure you discuss any questions you have with your health care provider.

## 2013-09-20 NOTE — ED Notes (Signed)
Pt resting quietly without any complaints

## 2013-09-21 ENCOUNTER — Telehealth: Payer: Self-pay | Admitting: *Deleted

## 2013-09-21 LAB — URINE CULTURE: Colony Count: 2000

## 2013-09-21 LAB — CULTURE, BLOOD (SINGLE)

## 2013-09-21 NOTE — Telephone Encounter (Signed)
Message copied by Randolm Idol on Thu Sep 21, 2013 10:30 AM ------      Message from: Wyatt Portela      Created: Thu Sep 21, 2013  8:15 AM       Please call him or his daughter. His blood culture did not show infection. She was concerned about that. ------

## 2013-09-21 NOTE — Telephone Encounter (Signed)
Per Awilda Metro pa, Called daughter stephanie cox. Blood cultures did not show any infection.

## 2013-09-21 NOTE — Addendum Note (Signed)
Addended by: Parke Poisson E on: 09/21/2013 03:22 PM   Modules accepted: Orders

## 2013-09-26 ENCOUNTER — Ambulatory Visit (HOSPITAL_COMMUNITY)
Admission: RE | Admit: 2013-09-26 | Discharge: 2013-09-26 | Disposition: A | Discharge status: Home / Self Care | Payer: Medicare Other | Source: Ambulatory Visit | Attending: Oncology | Admitting: Oncology

## 2013-09-26 ENCOUNTER — Encounter: Payer: Self-pay | Admitting: Internal Medicine

## 2013-09-26 LAB — CULTURE, BLOOD (ROUTINE X 2)
CULTURE: NO GROWTH
Culture: NO GROWTH

## 2013-09-29 ENCOUNTER — Other Ambulatory Visit (HOSPITAL_BASED_OUTPATIENT_CLINIC_OR_DEPARTMENT_OTHER): Payer: Medicare Other

## 2013-09-29 ENCOUNTER — Ambulatory Visit (HOSPITAL_BASED_OUTPATIENT_CLINIC_OR_DEPARTMENT_OTHER): Payer: Medicare Other

## 2013-09-29 VITALS — BP 127/65 | HR 76 | Temp 98.0°F

## 2013-09-29 DIAGNOSIS — C8589 Other specified types of non-Hodgkin lymphoma, extranodal and solid organ sites: Secondary | ICD-10-CM

## 2013-09-29 DIAGNOSIS — D61818 Other pancytopenia: Secondary | ICD-10-CM

## 2013-09-29 DIAGNOSIS — Z23 Encounter for immunization: Secondary | ICD-10-CM

## 2013-09-29 DIAGNOSIS — D469 Myelodysplastic syndrome, unspecified: Secondary | ICD-10-CM

## 2013-09-29 LAB — CBC WITH DIFFERENTIAL/PLATELET
BASO%: 0.3 % (ref 0.0–2.0)
Basophils Absolute: 0 10*3/uL (ref 0.0–0.1)
EOS%: 0.7 % (ref 0.0–7.0)
Eosinophils Absolute: 0 10*3/uL (ref 0.0–0.5)
HEMATOCRIT: 28.7 % — AB (ref 38.4–49.9)
HGB: 9.8 g/dL — ABNORMAL LOW (ref 13.0–17.1)
LYMPH#: 0.9 10*3/uL (ref 0.9–3.3)
LYMPH%: 57.3 % — ABNORMAL HIGH (ref 14.0–49.0)
MCH: 35.4 pg — AB (ref 27.2–33.4)
MCHC: 34.2 g/dL (ref 32.0–36.0)
MCV: 103.3 fL — ABNORMAL HIGH (ref 79.3–98.0)
MONO#: 0 10*3/uL — ABNORMAL LOW (ref 0.1–0.9)
MONO%: 0.5 % (ref 0.0–14.0)
NEUT#: 0.7 10*3/uL — ABNORMAL LOW (ref 1.5–6.5)
NEUT%: 41.2 % (ref 39.0–75.0)
Platelets: 113 10*3/uL — ABNORMAL LOW (ref 140–400)
RBC: 2.78 10*6/uL — ABNORMAL LOW (ref 4.20–5.82)
RDW: 21.4 % — ABNORMAL HIGH (ref 11.0–14.6)
WBC: 1.6 10*3/uL — ABNORMAL LOW (ref 4.0–10.3)

## 2013-09-29 LAB — HOLD TUBE, BLOOD BANK

## 2013-09-29 MED ORDER — INFLUENZA VAC SPLIT QUAD 0.5 ML IM SUSY
0.5000 mL | PREFILLED_SYRINGE | Freq: Once | INTRAMUSCULAR | Status: AC
  Administered 2013-09-29: 0.5 mL via INTRAMUSCULAR
  Filled 2013-09-29: qty 0.5

## 2013-09-29 MED ORDER — DARBEPOETIN ALFA-POLYSORBATE 300 MCG/0.6ML IJ SOLN
300.0000 ug | Freq: Once | INTRAMUSCULAR | Status: AC
  Administered 2013-09-29: 300 ug via SUBCUTANEOUS
  Filled 2013-09-29: qty 0.6

## 2013-10-09 ENCOUNTER — Ambulatory Visit (INDEPENDENT_AMBULATORY_CARE_PROVIDER_SITE_OTHER): Payer: Medicare Other | Admitting: *Deleted

## 2013-10-09 DIAGNOSIS — I441 Atrioventricular block, second degree: Secondary | ICD-10-CM

## 2013-10-09 LAB — MDC_IDC_ENUM_SESS_TYPE_REMOTE
Battery Impedance: 100 Ohm
Battery Remaining Longevity: 131 mo
Battery Voltage: 2.78 V
Brady Statistic AP VP Percent: 1 %
Brady Statistic AP VS Percent: 0 %
Brady Statistic AS VP Percent: 99 %
Date Time Interrogation Session: 20150914114951
Lead Channel Impedance Value: 473 Ohm
Lead Channel Pacing Threshold Amplitude: 1 V
Lead Channel Pacing Threshold Pulse Width: 0.4 ms
Lead Channel Sensing Intrinsic Amplitude: 1.4 mV
Lead Channel Setting Pacing Amplitude: 2 V
Lead Channel Setting Pacing Pulse Width: 0.4 ms
Lead Channel Setting Sensing Sensitivity: 4 mV
MDC IDC MSMT LEADCHNL RA PACING THRESHOLD PULSEWIDTH: 0.4 ms
MDC IDC MSMT LEADCHNL RV IMPEDANCE VALUE: 451 Ohm
MDC IDC MSMT LEADCHNL RV PACING THRESHOLD AMPLITUDE: 0.625 V
MDC IDC SET LEADCHNL RV PACING AMPLITUDE: 2.5 V
MDC IDC STAT BRADY AS VS PERCENT: 0 %

## 2013-10-09 NOTE — Progress Notes (Signed)
Remote pacemaker transmission.   

## 2013-10-12 ENCOUNTER — Other Ambulatory Visit: Payer: Self-pay | Admitting: Internal Medicine

## 2013-10-13 ENCOUNTER — Ambulatory Visit: Payer: Medicare Other

## 2013-10-13 ENCOUNTER — Other Ambulatory Visit (HOSPITAL_BASED_OUTPATIENT_CLINIC_OR_DEPARTMENT_OTHER): Payer: Medicare Other

## 2013-10-13 ENCOUNTER — Ambulatory Visit (HOSPITAL_BASED_OUTPATIENT_CLINIC_OR_DEPARTMENT_OTHER): Payer: Medicare Other

## 2013-10-13 DIAGNOSIS — D469 Myelodysplastic syndrome, unspecified: Secondary | ICD-10-CM

## 2013-10-13 DIAGNOSIS — D61818 Other pancytopenia: Secondary | ICD-10-CM

## 2013-10-13 LAB — CBC WITH DIFFERENTIAL/PLATELET
BASO%: 0 % (ref 0.0–2.0)
Basophils Absolute: 0 10*3/uL (ref 0.0–0.1)
EOS ABS: 0 10*3/uL (ref 0.0–0.5)
EOS%: 1.4 % (ref 0.0–7.0)
HCT: 26.9 % — ABNORMAL LOW (ref 38.4–49.9)
HGB: 9.1 g/dL — ABNORMAL LOW (ref 13.0–17.1)
LYMPH#: 0.9 10*3/uL (ref 0.9–3.3)
LYMPH%: 62 % — ABNORMAL HIGH (ref 14.0–49.0)
MCH: 34.9 pg — ABNORMAL HIGH (ref 27.2–33.4)
MCHC: 33.8 g/dL (ref 32.0–36.0)
MCV: 103.1 fL — ABNORMAL HIGH (ref 79.3–98.0)
MONO#: 0 10*3/uL — ABNORMAL LOW (ref 0.1–0.9)
MONO%: 0.7 % (ref 0.0–14.0)
NEUT%: 35.9 % — ABNORMAL LOW (ref 39.0–75.0)
NEUTROS ABS: 0.5 10*3/uL — AB (ref 1.5–6.5)
NRBC: 1 % — AB (ref 0–0)
Platelets: 98 10*3/uL — ABNORMAL LOW (ref 140–400)
RBC: 2.61 10*6/uL — ABNORMAL LOW (ref 4.20–5.82)
RDW: 20.3 % — AB (ref 11.0–14.6)
WBC: 1.4 10*3/uL — AB (ref 4.0–10.3)

## 2013-10-13 LAB — HOLD TUBE, BLOOD BANK

## 2013-10-13 MED ORDER — DARBEPOETIN ALFA-POLYSORBATE 300 MCG/0.6ML IJ SOLN
300.0000 ug | Freq: Once | INTRAMUSCULAR | Status: AC
Start: 2013-10-13 — End: 2013-10-13
  Administered 2013-10-13: 300 ug via SUBCUTANEOUS
  Filled 2013-10-13: qty 0.6

## 2013-10-13 NOTE — Progress Notes (Signed)
Received Aranesp injection in infusion area.

## 2013-10-13 NOTE — Progress Notes (Signed)
Per Dr. Hazeline Junker nurse Nyoka Cowden, RN pt to only receive aranesp injection and will not get any blood transfusion today.  Pt verbalized understanding.

## 2013-10-13 NOTE — Patient Instructions (Signed)
Darbepoetin Alfa injection What is this medicine? DARBEPOETIN ALFA (dar be POE e tin AL fa) helps your body make more red blood cells. It is used to treat anemia caused by chronic kidney failure and chemotherapy. This medicine may be used for other purposes; ask your health care provider or pharmacist if you have questions. COMMON BRAND NAME(S): Aranesp What should I tell my health care provider before I take this medicine? They need to know if you have any of these conditions: -blood clotting disorders or history of blood clots -cancer patient not on chemotherapy -cystic fibrosis -heart disease, such as angina, heart failure, or a history of a heart attack -hemoglobin level of 12 g/dL or greater -high blood pressure -low levels of folate, iron, or vitamin B12 -seizures -an unusual or allergic reaction to darbepoetin, erythropoietin, albumin, hamster proteins, latex, other medicines, foods, dyes, or preservatives -pregnant or trying to get pregnant -breast-feeding How should I use this medicine? This medicine is for injection into a vein or under the skin. It is usually given by a health care professional in a hospital or clinic setting. If you get this medicine at home, you will be taught how to prepare and give this medicine. Do not shake the solution before you withdraw a dose. Use exactly as directed. Take your medicine at regular intervals. Do not take your medicine more often than directed. It is important that you put your used needles and syringes in a special sharps container. Do not put them in a trash can. If you do not have a sharps container, call your pharmacist or healthcare provider to get one. Talk to your pediatrician regarding the use of this medicine in children. While this medicine may be used in children as young as 1 year for selected conditions, precautions do apply. Overdosage: If you think you have taken too much of this medicine contact a poison control center or  emergency room at once. NOTE: This medicine is only for you. Do not share this medicine with others. What if I miss a dose? If you miss a dose, take it as soon as you can. If it is almost time for your next dose, take only that dose. Do not take double or extra doses. What may interact with this medicine? Do not take this medicine with any of the following medications: -epoetin alfa This list may not describe all possible interactions. Give your health care provider a list of all the medicines, herbs, non-prescription drugs, or dietary supplements you use. Also tell them if you smoke, drink alcohol, or use illegal drugs. Some items may interact with your medicine. What should I watch for while using this medicine? Visit your prescriber or health care professional for regular checks on your progress and for the needed blood tests and blood pressure measurements. It is especially important for the doctor to make sure your hemoglobin level is in the desired range, to limit the risk of potential side effects and to give you the best benefit. Keep all appointments for any recommended tests. Check your blood pressure as directed. Ask your doctor what your blood pressure should be and when you should contact him or her. As your body makes more red blood cells, you may need to take iron, folic acid, or vitamin B supplements. Ask your doctor or health care provider which products are right for you. If you have kidney disease continue dietary restrictions, even though this medication can make you feel better. Talk with your doctor or health   care professional about the foods you eat and the vitamins that you take. What side effects may I notice from receiving this medicine? Side effects that you should report to your doctor or health care professional as soon as possible: -allergic reactions like skin rash, itching or hives, swelling of the face, lips, or tongue -breathing problems -changes in vision -chest  pain -confusion, trouble speaking or understanding -feeling faint or lightheaded, falls -high blood pressure -muscle aches or pains -pain, swelling, warmth in the leg -rapid weight gain -severe headaches -sudden numbness or weakness of the face, arm or leg -trouble walking, dizziness, loss of balance or coordination -seizures (convulsions) -swelling of the ankles, feet, hands -unusually weak or tired Side effects that usually do not require medical attention (report to your doctor or health care professional if they continue or are bothersome): -diarrhea -fever, chills (flu-like symptoms) -headaches -nausea, vomiting -redness, stinging, or swelling at site where injected This list may not describe all possible side effects. Call your doctor for medical advice about side effects. You may report side effects to FDA at 1-800-FDA-1088. Where should I keep my medicine? Keep out of the reach of children. Store in a refrigerator between 2 and 8 degrees C (36 and 46 degrees F). Do not freeze. Do not shake. Throw away any unused portion if using a single-dose vial. Throw away any unused medicine after the expiration date. NOTE: This sheet is a summary. It may not cover all possible information. If you have questions about this medicine, talk to your doctor, pharmacist, or health care provider.  2015, [employer]vier/[employer] Standard. (2007-12-27 10:23:57)  

## 2013-10-18 ENCOUNTER — Encounter: Payer: Self-pay | Admitting: Cardiology

## 2013-10-20 ENCOUNTER — Encounter: Payer: Self-pay | Admitting: Cardiology

## 2013-10-23 ENCOUNTER — Encounter: Payer: Self-pay | Admitting: Internal Medicine

## 2013-10-26 ENCOUNTER — Ambulatory Visit (HOSPITAL_COMMUNITY)
Admission: RE | Admit: 2013-10-26 | Discharge: 2013-10-26 | Disposition: A | Discharge status: Home / Self Care | Payer: Medicare Other | Source: Ambulatory Visit | Attending: Oncology | Admitting: Oncology

## 2013-10-26 DIAGNOSIS — C8595 Non-Hodgkin lymphoma, unspecified, lymph nodes of inguinal region and lower limb: Secondary | ICD-10-CM

## 2013-10-26 DIAGNOSIS — C8590 Non-Hodgkin lymphoma, unspecified, unspecified site: Secondary | ICD-10-CM

## 2013-10-27 ENCOUNTER — Ambulatory Visit: Payer: Medicare Other

## 2013-10-27 ENCOUNTER — Ambulatory Visit (HOSPITAL_BASED_OUTPATIENT_CLINIC_OR_DEPARTMENT_OTHER): Payer: Medicare Other

## 2013-10-27 ENCOUNTER — Other Ambulatory Visit (HOSPITAL_BASED_OUTPATIENT_CLINIC_OR_DEPARTMENT_OTHER): Payer: Medicare Other

## 2013-10-27 ENCOUNTER — Other Ambulatory Visit: Payer: Self-pay | Admitting: Medical Oncology

## 2013-10-27 VITALS — BP 127/68 | HR 69 | Temp 97.7°F | Resp 20

## 2013-10-27 DIAGNOSIS — C8590 Non-Hodgkin lymphoma, unspecified, unspecified site: Secondary | ICD-10-CM

## 2013-10-27 DIAGNOSIS — D61818 Other pancytopenia: Secondary | ICD-10-CM

## 2013-10-27 DIAGNOSIS — D469 Myelodysplastic syndrome, unspecified: Secondary | ICD-10-CM

## 2013-10-27 DIAGNOSIS — C8595 Non-Hodgkin lymphoma, unspecified, lymph nodes of inguinal region and lower limb: Secondary | ICD-10-CM | POA: Diagnosis present

## 2013-10-27 LAB — CBC WITH DIFFERENTIAL/PLATELET
BASO%: 0 % (ref 0.0–2.0)
Basophils Absolute: 0 10*3/uL (ref 0.0–0.1)
EOS%: 2.2 % (ref 0.0–7.0)
Eosinophils Absolute: 0 10*3/uL (ref 0.0–0.5)
HCT: 26 % — ABNORMAL LOW (ref 38.4–49.9)
HGB: 8.8 g/dL — ABNORMAL LOW (ref 13.0–17.1)
LYMPH%: 62.3 % — ABNORMAL HIGH (ref 14.0–49.0)
MCH: 36.1 pg — ABNORMAL HIGH (ref 27.2–33.4)
MCHC: 33.8 g/dL (ref 32.0–36.0)
MCV: 106.6 fL — ABNORMAL HIGH (ref 79.3–98.0)
MONO#: 0 10*3/uL — AB (ref 0.1–0.9)
MONO%: 2.2 % (ref 0.0–14.0)
NEUT%: 33.3 % — ABNORMAL LOW (ref 39.0–75.0)
NEUTROS ABS: 0.5 10*3/uL — AB (ref 1.5–6.5)
Platelets: 77 10*3/uL — ABNORMAL LOW (ref 140–400)
RBC: 2.44 10*6/uL — AB (ref 4.20–5.82)
RDW: 20.2 % — ABNORMAL HIGH (ref 11.0–14.6)
WBC: 1.4 10*3/uL — ABNORMAL LOW (ref 4.0–10.3)
lymph#: 0.9 10*3/uL (ref 0.9–3.3)

## 2013-10-27 LAB — PREPARE RBC (CROSSMATCH)

## 2013-10-27 LAB — HOLD TUBE, BLOOD BANK

## 2013-10-27 MED ORDER — ACETAMINOPHEN 325 MG PO TABS
650.0000 mg | ORAL_TABLET | Freq: Once | ORAL | Status: AC
  Administered 2013-10-27: 650 mg via ORAL

## 2013-10-27 MED ORDER — SODIUM CHLORIDE 0.9 % IV SOLN
250.0000 mL | Freq: Once | INTRAVENOUS | Status: AC
  Administered 2013-10-27: 250 mL via INTRAVENOUS

## 2013-10-27 MED ORDER — DIPHENHYDRAMINE HCL 25 MG PO CAPS
ORAL_CAPSULE | ORAL | Status: AC
  Filled 2013-10-27: qty 1

## 2013-10-27 MED ORDER — DIPHENHYDRAMINE HCL 25 MG PO CAPS
25.0000 mg | ORAL_CAPSULE | Freq: Once | ORAL | Status: AC
  Administered 2013-10-27: 25 mg via ORAL

## 2013-10-27 MED ORDER — FUROSEMIDE 10 MG/ML IJ SOLN
20.0000 mg | Freq: Once | INTRAMUSCULAR | Status: AC
  Administered 2013-10-27: 20 mg via INTRAVENOUS

## 2013-10-27 MED ORDER — ACETAMINOPHEN 325 MG PO TABS
ORAL_TABLET | ORAL | Status: AC
  Filled 2013-10-27: qty 2

## 2013-10-27 MED ORDER — DARBEPOETIN ALFA-POLYSORBATE 300 MCG/0.6ML IJ SOLN
300.0000 ug | Freq: Once | INTRAMUSCULAR | Status: AC
Start: 2013-10-27 — End: 2013-10-27
  Administered 2013-10-27: 300 ug via SUBCUTANEOUS
  Filled 2013-10-27: qty 0.6

## 2013-10-27 NOTE — Patient Instructions (Signed)

## 2013-10-29 LAB — TYPE AND SCREEN
ABO/RH(D): A POS
Antibody Screen: NEGATIVE
Unit division: 0

## 2013-11-09 ENCOUNTER — Other Ambulatory Visit: Payer: Self-pay | Admitting: Internal Medicine

## 2013-11-10 ENCOUNTER — Other Ambulatory Visit (HOSPITAL_BASED_OUTPATIENT_CLINIC_OR_DEPARTMENT_OTHER): Payer: Medicare Other

## 2013-11-10 ENCOUNTER — Ambulatory Visit: Payer: Medicare Other

## 2013-11-10 ENCOUNTER — Ambulatory Visit (HOSPITAL_BASED_OUTPATIENT_CLINIC_OR_DEPARTMENT_OTHER): Payer: Medicare Other

## 2013-11-10 DIAGNOSIS — D61818 Other pancytopenia: Secondary | ICD-10-CM

## 2013-11-10 DIAGNOSIS — D469 Myelodysplastic syndrome, unspecified: Secondary | ICD-10-CM

## 2013-11-10 LAB — CBC WITH DIFFERENTIAL/PLATELET
BASO%: 0 % (ref 0.0–2.0)
Basophils Absolute: 0 10*3/uL (ref 0.0–0.1)
EOS ABS: 0 10*3/uL (ref 0.0–0.5)
EOS%: 1.1 % (ref 0.0–7.0)
HCT: 27.4 % — ABNORMAL LOW (ref 38.4–49.9)
HGB: 9.4 g/dL — ABNORMAL LOW (ref 13.0–17.1)
LYMPH%: 61.4 % — AB (ref 14.0–49.0)
MCH: 36.4 pg — ABNORMAL HIGH (ref 27.2–33.4)
MCHC: 34.3 g/dL (ref 32.0–36.0)
MCV: 106.2 fL — ABNORMAL HIGH (ref 79.3–98.0)
MONO#: 0 10*3/uL — ABNORMAL LOW (ref 0.1–0.9)
MONO%: 0.6 % (ref 0.0–14.0)
NEUT%: 36.9 % — ABNORMAL LOW (ref 39.0–75.0)
NEUTROS ABS: 0.7 10*3/uL — AB (ref 1.5–6.5)
Platelets: 89 10*3/uL — ABNORMAL LOW (ref 140–400)
RBC: 2.58 10*6/uL — AB (ref 4.20–5.82)
RDW: 19.5 % — AB (ref 11.0–14.6)
WBC: 1.8 10*3/uL — ABNORMAL LOW (ref 4.0–10.3)
lymph#: 1.1 10*3/uL (ref 0.9–3.3)

## 2013-11-10 LAB — HOLD TUBE, BLOOD BANK

## 2013-11-10 MED ORDER — DARBEPOETIN ALFA-POLYSORBATE 300 MCG/0.6ML IJ SOLN
300.0000 ug | Freq: Once | INTRAMUSCULAR | Status: AC
  Administered 2013-11-10: 300 ug via SUBCUTANEOUS
  Filled 2013-11-10: qty 0.6

## 2013-11-10 NOTE — Patient Instructions (Signed)
Darbepoetin Alfa injection What is this medicine? DARBEPOETIN ALFA (dar be POE e tin AL fa) helps your body make more red blood cells. It is used to treat anemia caused by chronic kidney failure and chemotherapy. This medicine may be used for other purposes; ask your health care provider or pharmacist if you have questions. COMMON BRAND NAME(S): Aranesp What should I tell my health care provider before I take this medicine? They need to know if you have any of these conditions: -blood clotting disorders or history of blood clots -cancer patient not on chemotherapy -cystic fibrosis -heart disease, such as angina, heart failure, or a history of a heart attack -hemoglobin level of 12 g/dL or greater -high blood pressure -low levels of folate, iron, or vitamin B12 -seizures -an unusual or allergic reaction to darbepoetin, erythropoietin, albumin, hamster proteins, latex, other medicines, foods, dyes, or preservatives -pregnant or trying to get pregnant -breast-feeding How should I use this medicine? This medicine is for injection into a vein or under the skin. It is usually given by a health care professional in a hospital or clinic setting. If you get this medicine at home, you will be taught how to prepare and give this medicine. Do not shake the solution before you withdraw a dose. Use exactly as directed. Take your medicine at regular intervals. Do not take your medicine more often than directed. It is important that you put your used needles and syringes in a special sharps container. Do not put them in a trash can. If you do not have a sharps container, call your pharmacist or healthcare provider to get one. Talk to your pediatrician regarding the use of this medicine in children. While this medicine may be used in children as young as 1 year for selected conditions, precautions do apply. Overdosage: If you think you have taken too much of this medicine contact a poison control center or  emergency room at once. NOTE: This medicine is only for you. Do not share this medicine with others. What if I miss a dose? If you miss a dose, take it as soon as you can. If it is almost time for your next dose, take only that dose. Do not take double or extra doses. What may interact with this medicine? Do not take this medicine with any of the following medications: -epoetin alfa This list may not describe all possible interactions. Give your health care provider a list of all the medicines, herbs, non-prescription drugs, or dietary supplements you use. Also tell them if you smoke, drink alcohol, or use illegal drugs. Some items may interact with your medicine. What should I watch for while using this medicine? Visit your prescriber or health care professional for regular checks on your progress and for the needed blood tests and blood pressure measurements. It is especially important for the doctor to make sure your hemoglobin level is in the desired range, to limit the risk of potential side effects and to give you the best benefit. Keep all appointments for any recommended tests. Check your blood pressure as directed. Ask your doctor what your blood pressure should be and when you should contact him or her. As your body makes more red blood cells, you may need to take iron, folic acid, or vitamin B supplements. Ask your doctor or health care provider which products are right for you. If you have kidney disease continue dietary restrictions, even though this medication can make you feel better. Talk with your doctor or health   care professional about the foods you eat and the vitamins that you take. What side effects may I notice from receiving this medicine? Side effects that you should report to your doctor or health care professional as soon as possible: -allergic reactions like skin rash, itching or hives, swelling of the face, lips, or tongue -breathing problems -changes in vision -chest  pain -confusion, trouble speaking or understanding -feeling faint or lightheaded, falls -high blood pressure -muscle aches or pains -pain, swelling, warmth in the leg -rapid weight gain -severe headaches -sudden numbness or weakness of the face, arm or leg -trouble walking, dizziness, loss of balance or coordination -seizures (convulsions) -swelling of the ankles, feet, hands -unusually weak or tired Side effects that usually do not require medical attention (report to your doctor or health care professional if they continue or are bothersome): -diarrhea -fever, chills (flu-like symptoms) -headaches -nausea, vomiting -redness, stinging, or swelling at site where injected This list may not describe all possible side effects. Call your doctor for medical advice about side effects. You may report side effects to FDA at 1-800-FDA-1088. Where should I keep my medicine? Keep out of the reach of children. Store in a refrigerator between 2 and 8 degrees C (36 and 46 degrees F). Do not freeze. Do not shake. Throw away any unused portion if using a single-dose vial. Throw away any unused medicine after the expiration date. NOTE: This sheet is a summary. It may not cover all possible information. If you have questions about this medicine, talk to your doctor, pharmacist, or health care provider.  2015, [employer]vier/[employer] Standard. (2007-12-27 10:23:57)  

## 2013-11-10 NOTE — Progress Notes (Signed)
Reviewed labs with Dr. [name]. HGB 9.4  No transfusion today per Dr. [name].  Only Aranesp injection.

## 2013-11-24 ENCOUNTER — Other Ambulatory Visit (HOSPITAL_BASED_OUTPATIENT_CLINIC_OR_DEPARTMENT_OTHER): Payer: Medicare Other

## 2013-11-24 ENCOUNTER — Other Ambulatory Visit: Payer: Self-pay | Admitting: Medical Oncology

## 2013-11-24 ENCOUNTER — Other Ambulatory Visit: Payer: Self-pay | Admitting: *Deleted

## 2013-11-24 ENCOUNTER — Ambulatory Visit (HOSPITAL_BASED_OUTPATIENT_CLINIC_OR_DEPARTMENT_OTHER): Payer: Medicare Other

## 2013-11-24 ENCOUNTER — Ambulatory Visit: Payer: Medicare Other

## 2013-11-24 VITALS — BP 119/66 | HR 68 | Temp 97.7°F | Resp 16

## 2013-11-24 DIAGNOSIS — D61818 Other pancytopenia: Secondary | ICD-10-CM

## 2013-11-24 DIAGNOSIS — C8595 Non-Hodgkin lymphoma, unspecified, lymph nodes of inguinal region and lower limb: Secondary | ICD-10-CM | POA: Diagnosis not present

## 2013-11-24 DIAGNOSIS — D469 Myelodysplastic syndrome, unspecified: Secondary | ICD-10-CM

## 2013-11-24 LAB — CBC WITH DIFFERENTIAL/PLATELET
BASO%: 0 % (ref 0.0–2.0)
Basophils Absolute: 0 10*3/uL (ref 0.0–0.1)
EOS%: 0.6 % (ref 0.0–7.0)
Eosinophils Absolute: 0 10*3/uL (ref 0.0–0.5)
HEMATOCRIT: 25.9 % — AB (ref 38.4–49.9)
HGB: 8.9 g/dL — ABNORMAL LOW (ref 13.0–17.1)
LYMPH#: 1.1 10*3/uL (ref 0.9–3.3)
LYMPH%: 62.4 % — AB (ref 14.0–49.0)
MCH: 36.9 pg — AB (ref 27.2–33.4)
MCHC: 34.4 g/dL (ref 32.0–36.0)
MCV: 107.5 fL — ABNORMAL HIGH (ref 79.3–98.0)
MONO#: 0 10*3/uL — ABNORMAL LOW (ref 0.1–0.9)
MONO%: 0.6 % (ref 0.0–14.0)
NEUT#: 0.7 10*3/uL — ABNORMAL LOW (ref 1.5–6.5)
NEUT%: 36.4 % — ABNORMAL LOW (ref 39.0–75.0)
Platelets: 86 10*3/uL — ABNORMAL LOW (ref 140–400)
RBC: 2.41 10*6/uL — ABNORMAL LOW (ref 4.20–5.82)
RDW: 18.3 % — ABNORMAL HIGH (ref 11.0–14.6)
WBC: 1.8 10*3/uL — ABNORMAL LOW (ref 4.0–10.3)

## 2013-11-24 LAB — HOLD TUBE, BLOOD BANK

## 2013-11-24 LAB — PREPARE RBC (CROSSMATCH)

## 2013-11-24 MED ORDER — ACETAMINOPHEN 325 MG PO TABS
ORAL_TABLET | ORAL | Status: AC
  Filled 2013-11-24: qty 2

## 2013-11-24 MED ORDER — FUROSEMIDE 10 MG/ML IJ SOLN
20.0000 mg | Freq: Once | INTRAMUSCULAR | Status: AC
  Administered 2013-11-24: 20 mg via INTRAVENOUS

## 2013-11-24 MED ORDER — DIPHENHYDRAMINE HCL 25 MG PO CAPS
ORAL_CAPSULE | ORAL | Status: AC
  Filled 2013-11-24: qty 1

## 2013-11-24 MED ORDER — DARBEPOETIN ALFA-POLYSORBATE 300 MCG/0.6ML IJ SOLN
300.0000 ug | Freq: Once | INTRAMUSCULAR | Status: AC
  Administered 2013-11-24: 300 ug via SUBCUTANEOUS
  Filled 2013-11-24: qty 0.6

## 2013-11-24 MED ORDER — DIPHENHYDRAMINE HCL 25 MG PO CAPS
25.0000 mg | ORAL_CAPSULE | Freq: Once | ORAL | Status: AC
  Administered 2013-11-24: 25 mg via ORAL

## 2013-11-24 MED ORDER — ACETAMINOPHEN 325 MG PO TABS
650.0000 mg | ORAL_TABLET | Freq: Once | ORAL | Status: AC
  Administered 2013-11-24: 650 mg via ORAL

## 2013-11-24 MED ORDER — SODIUM CHLORIDE 0.9 % IV SOLN
INTRAVENOUS | Status: DC
  Administered 2013-11-24: 10:00:00 via INTRAVENOUS

## 2013-11-24 NOTE — Patient Instructions (Signed)

## 2013-11-24 NOTE — Progress Notes (Signed)
Duplicate appts. 

## 2013-11-25 LAB — TYPE AND SCREEN
ABO/RH(D): A POS
ANTIBODY SCREEN: NEGATIVE
Unit division: 0

## 2013-11-26 ENCOUNTER — Ambulatory Visit (HOSPITAL_COMMUNITY)
Admission: RE | Admit: 2013-11-26 | Discharge: 2013-11-26 | Disposition: A | Discharge status: Home / Self Care | Payer: Medicare Other | Source: Ambulatory Visit | Attending: Oncology | Admitting: Oncology

## 2013-11-26 DIAGNOSIS — D61818 Other pancytopenia: Secondary | ICD-10-CM | POA: Insufficient documentation

## 2013-12-06 ENCOUNTER — Other Ambulatory Visit: Payer: Self-pay | Admitting: Internal Medicine

## 2013-12-07 ENCOUNTER — Other Ambulatory Visit: Payer: Self-pay | Admitting: Internal Medicine

## 2013-12-08 ENCOUNTER — Telehealth: Payer: Self-pay | Admitting: Oncology

## 2013-12-08 ENCOUNTER — Ambulatory Visit: Payer: Medicare Other

## 2013-12-08 ENCOUNTER — Ambulatory Visit (HOSPITAL_BASED_OUTPATIENT_CLINIC_OR_DEPARTMENT_OTHER): Payer: Medicare Other | Admitting: Physician Assistant

## 2013-12-08 ENCOUNTER — Ambulatory Visit (HOSPITAL_BASED_OUTPATIENT_CLINIC_OR_DEPARTMENT_OTHER): Payer: Medicare Other

## 2013-12-08 ENCOUNTER — Other Ambulatory Visit (HOSPITAL_BASED_OUTPATIENT_CLINIC_OR_DEPARTMENT_OTHER): Payer: Medicare Other

## 2013-12-08 VITALS — BP 138/78 | HR 95 | Temp 98.4°F | Resp 18 | Ht 71.0 in | Wt 232.2 lb

## 2013-12-08 DIAGNOSIS — D472 Monoclonal gammopathy: Secondary | ICD-10-CM

## 2013-12-08 DIAGNOSIS — D61818 Other pancytopenia: Secondary | ICD-10-CM

## 2013-12-08 DIAGNOSIS — Z8572 Personal history of non-Hodgkin lymphomas: Secondary | ICD-10-CM

## 2013-12-08 DIAGNOSIS — D469 Myelodysplastic syndrome, unspecified: Secondary | ICD-10-CM

## 2013-12-08 LAB — HOLD TUBE, BLOOD BANK

## 2013-12-08 LAB — CBC WITH DIFFERENTIAL/PLATELET
BASO%: 0 % (ref 0.0–2.0)
Basophils Absolute: 0 10*3/uL (ref 0.0–0.1)
EOS ABS: 0 10*3/uL (ref 0.0–0.5)
EOS%: 1.7 % (ref 0.0–7.0)
HEMATOCRIT: 27.6 % — AB (ref 38.4–49.9)
HGB: 9.5 g/dL — ABNORMAL LOW (ref 13.0–17.1)
LYMPH#: 1.2 10*3/uL (ref 0.9–3.3)
LYMPH%: 68 % — AB (ref 14.0–49.0)
MCH: 36.1 pg — ABNORMAL HIGH (ref 27.2–33.4)
MCHC: 34.4 g/dL (ref 32.0–36.0)
MCV: 104.9 fL — ABNORMAL HIGH (ref 79.3–98.0)
MONO#: 0 10*3/uL — ABNORMAL LOW (ref 0.1–0.9)
MONO%: 0 % (ref 0.0–14.0)
NEUT%: 30.3 % — AB (ref 39.0–75.0)
NEUTROS ABS: 0.6 10*3/uL — AB (ref 1.5–6.5)
PLATELETS: 86 10*3/uL — AB (ref 140–400)
RBC: 2.63 10*6/uL — ABNORMAL LOW (ref 4.20–5.82)
RDW: 20.2 % — ABNORMAL HIGH (ref 11.0–14.6)
WBC: 1.8 10*3/uL — ABNORMAL LOW (ref 4.0–10.3)

## 2013-12-08 MED ORDER — DARBEPOETIN ALFA 300 MCG/0.6ML IJ SOSY
300.0000 ug | PREFILLED_SYRINGE | Freq: Once | INTRAMUSCULAR | Status: AC
  Administered 2013-12-08: 300 ug via SUBCUTANEOUS
  Filled 2013-12-08: qty 0.6

## 2013-12-08 NOTE — Telephone Encounter (Signed)
added all appts for pt...MW nice enough to print and gv to pt

## 2013-12-08 NOTE — Progress Notes (Signed)
Per Awilda Metro, PA, no RBC transfusion today for Hgb 9.5. Patient received Aranesp and discharged to scheduling in stable condition, ambulatory, with family member.

## 2013-12-08 NOTE — Patient Instructions (Signed)
Darbepoetin Alfa injection What is this medicine? DARBEPOETIN ALFA (dar be POE e tin AL fa) helps your body make more red blood cells. It is used to treat anemia caused by chronic kidney failure and chemotherapy. This medicine may be used for other purposes; ask your health care provider or pharmacist if you have questions. COMMON BRAND NAME(S): Aranesp What should I tell my health care provider before I take this medicine? They need to know if you have any of these conditions: -blood clotting disorders or history of blood clots -cancer patient not on chemotherapy -cystic fibrosis -heart disease, such as angina, heart failure, or a history of a heart attack -hemoglobin level of 12 g/dL or greater -high blood pressure -low levels of folate, iron, or vitamin B12 -seizures -an unusual or allergic reaction to darbepoetin, erythropoietin, albumin, hamster proteins, latex, other medicines, foods, dyes, or preservatives -pregnant or trying to get pregnant -breast-feeding How should I use this medicine? This medicine is for injection into a vein or under the skin. It is usually given by a health care professional in a hospital or clinic setting. If you get this medicine at home, you will be taught how to prepare and give this medicine. Do not shake the solution before you withdraw a dose. Use exactly as directed. Take your medicine at regular intervals. Do not take your medicine more often than directed. It is important that you put your used needles and syringes in a special sharps container. Do not put them in a trash can. If you do not have a sharps container, call your pharmacist or healthcare provider to get one. Talk to your pediatrician regarding the use of this medicine in children. While this medicine may be used in children as young as 1 year for selected conditions, precautions do apply. Overdosage: If you think you have taken too much of this medicine contact a poison control center or  emergency room at once. NOTE: This medicine is only for you. Do not share this medicine with others. What if I miss a dose? If you miss a dose, take it as soon as you can. If it is almost time for your next dose, take only that dose. Do not take double or extra doses. What may interact with this medicine? Do not take this medicine with any of the following medications: -epoetin alfa This list may not describe all possible interactions. Give your health care provider a list of all the medicines, herbs, non-prescription drugs, or dietary supplements you use. Also tell them if you smoke, drink alcohol, or use illegal drugs. Some items may interact with your medicine. What should I watch for while using this medicine? Visit your prescriber or health care professional for regular checks on your progress and for the needed blood tests and blood pressure measurements. It is especially important for the doctor to make sure your hemoglobin level is in the desired range, to limit the risk of potential side effects and to give you the best benefit. Keep all appointments for any recommended tests. Check your blood pressure as directed. Ask your doctor what your blood pressure should be and when you should contact him or her. As your body makes more red blood cells, you may need to take iron, folic acid, or vitamin B supplements. Ask your doctor or health care provider which products are right for you. If you have kidney disease continue dietary restrictions, even though this medication can make you feel better. Talk with your doctor or health   care professional about the foods you eat and the vitamins that you take. What side effects may I notice from receiving this medicine? Side effects that you should report to your doctor or health care professional as soon as possible: -allergic reactions like skin rash, itching or hives, swelling of the face, lips, or tongue -breathing problems -changes in vision -chest  pain -confusion, trouble speaking or understanding -feeling faint or lightheaded, falls -high blood pressure -muscle aches or pains -pain, swelling, warmth in the leg -rapid weight gain -severe headaches -sudden numbness or weakness of the face, arm or leg -trouble walking, dizziness, loss of balance or coordination -seizures (convulsions) -swelling of the ankles, feet, hands -unusually weak or tired Side effects that usually do not require medical attention (report to your doctor or health care professional if they continue or are bothersome): -diarrhea -fever, chills (flu-like symptoms) -headaches -nausea, vomiting -redness, stinging, or swelling at site where injected This list may not describe all possible side effects. Call your doctor for medical advice about side effects. You may report side effects to FDA at 1-800-FDA-1088. Where should I keep my medicine? Keep out of the reach of children. Store in a refrigerator between 2 and 8 degrees C (36 and 46 degrees F). Do not freeze. Do not shake. Throw away any unused portion if using a single-dose vial. Throw away any unused medicine after the expiration date. NOTE: This sheet is a summary. It may not cover all possible information. If you have questions about this medicine, talk to your doctor, pharmacist, or health care provider.  2015, [employer]vier/[employer] Standard. (2007-12-27 10:23:57)  

## 2013-12-10 NOTE — Progress Notes (Signed)
Hematology and Oncology Follow Up Visit  TAISHAUN LEVELS [id number] 1932/03/29 80 y.o. 12/10/2013 12:33 PM  CC: [name]. [name], MD, FCCP    Principle Diagnosis: A 80 year old gentleman with the following diagnoses:   1. Follicular lymphoma, grade 3, stage III, diagnosed 2007. Continued to be in remission. 2. Pancytopenia associated with mild neutropenia and macrocytosis possibly indicating early myelodysplasia. Bone marrow  was repeated on 06/22/2012 confirmed the evidence of myelodysplastic syndrome 3. Monoclonal gammopathy of undetermined significance.  Prior Therapy:  1. Status post CHOP and rituximab.  He had complete response to therapy concluded in 2007.  No further imaging has been indicated at this time. 2. Status post bone marrow biopsy in April 2011, did not really show any evidence of myelodysplasia or metastatic lymphoma at this time.  Current therapy: Aranesp 300 mcg every 2weeks to keep his hemoglobin close to 11. He also has been receiving intermittent PRBC transfusions.  Interim History: Mr. Georg presents today for a followup visit with his daughter. Since his last visit, he has been the same for the most part. hhe reports feeling a little rundown. He states that he is staying at home more and not going out and socializing. He "gives out" when walking fast and has to slowed down and rest. He notes general decrease in his strength, having difficulty opening jars etc. No further bleeding episodes noted.  He reports no chest pain but continues to have some dyspnea on exertion. He does not report any dysphagia.  He does not report any lymphadenopathy. Denies night sweats. Overall performance status, activity level is relatively stable. He reports no syncope or falls. He uses a cane to ambulate at this time. He did not report any headaches or change in his vision. Does not report any seizures or confusion. He does not report any palpitation or orthopnea. Has not reported any edema.  Does not report any wheezing but still have exertional dyspnea. Is not reporting any abdominal pain but his appetite had been down slightly,however his weight has remained relatively stable recently.  He does not report any lymphadenopathy or petechiae. Has not reported any skin rashes or genitourinary complaints. Remainder of review of systems unremarkable.   Medications: I have reviewed the patient's current medications Current Outpatient Prescriptions  Medication Sig Dispense Refill  . acetaminophen (TYLENOL) 500 MG tablet 1-2 every 4-6 hours as needed for headache    . albuterol (PROAIR HFA) 108 (90 BASE) MCG/ACT inhaler 1-2 puffs every 4-6 hours as needed    . aspirin 81 MG tablet Take 81 mg by mouth every morning. (hold if bleeding)    . calcium-vitamin D (OS-CAL 500 + D) 500-200 MG-UNIT per tablet Take 1 tablet by mouth 2 (two) times daily.     . cetirizine (ZYRTEC) 10 MG tablet Take 10 mg by mouth at bedtime as needed for allergies.    . Cholecalciferol (VITAMIN D) 1000 UNITS capsule Take 1,000 Units by mouth 2 (two) times daily.      [name] dextromethorphan-guaiFENesin (MUCINEX DM) 30-600 MG per 12 hr tablet Take 1-2 tablets by mouth every 12 (twelve) hours as needed for cough.     . docusate sodium (COLACE) 100 MG capsule Take 100 mg by mouth at bedtime.    . famotidine (PEPCID) 20 MG tablet Take 20 mg by mouth at bedtime.     . finasteride (PROSCAR) 5 MG tablet Take 5 mg by mouth every morning.     . furosemide (LASIX) 40 MG tablet Take 40  mg by mouth daily. May add 1 extra daily as needed for swelling in legs    . hydrocortisone cream 1 % Apply 1 application topically 2 (two) times daily as needed (itchy rash).     . hydrOXYzine (ATARAX/VISTARIL) 25 MG tablet 1-2 every 4-6 hours as needed for itching    . KLOR-CON M20 20 MEQ tablet TAKE 1 TABLET BY MOUTH EVERY DAY 30 tablet 5  . levothyroxine (SYNTHROID, LEVOTHROID) 25 MCG tablet Take 25 mcg by mouth daily.    . metFORMIN (GLUCOPHAGE) 500  MG tablet Take 250 mg by mouth 2 (two) times daily.    . methylcellulose (CITRUCEL) oral powder Take 1 packet by mouth at bedtime.    . mometasone (NASONEX) 50 MCG/ACT nasal spray Place 0-2 sprays into the nose at bedtime.     . mometasone-formoterol (DULERA) 200-5 MCG/ACT AERO Inhale 2 puffs into the lungs 2 (two) times daily.    . Multiple Vitamin (MULTIVITAMIN) tablet Take 1 tablet by mouth daily.      [name] omeprazole (PRILOSEC) 20 MG capsule Take 40 mg by mouth every morning.     [name] oxybutynin (DITROPAN-XL) 10 MG 24 hr tablet Take 10 mg by mouth daily.    . polyethylene glycol powder (MIRALAX) powder Take 17 g by mouth daily.     . sulindac (CLINORIL) 200 MG tablet Take 200 mg by mouth 2 (two) times daily as needed.     . traMADol (ULTRAM) 50 MG tablet Take 50 mg by mouth every 4 (four) hours as needed.    . traZODone (DESYREL) 150 MG tablet TAKE 1/3 TABLET BY MOUTH AT BEDTIME 30 tablet 4   No current facility-administered medications for this visit.     Allergies: No Known Allergies  Past Medical History, Surgical history, Social history, and Family History were reviewed and updated.    Physical Exam: Blood pressure 138/78, pulse 95, temperature 98.4 F (36.9 C), temperature source Oral, resp. rate 18, height 5' 11"  (1.803 m), weight 232 lb 3.2 oz (105.325 kg), SpO2 97 %. ECOG: 1 General appearance: alert, slightly pale not in any distress. Head: Normocephalic, without obvious abnormality, atraumatic Neck: no adenopathy Lymph nodes: Cervical, supraclavicular, and axillary nodes normal. Heart:regular rate and rhythm, S1, S2 normal, no murmur, click, rub or gallop Lung:chest clear, no wheezing, rales, normal symmetric air entry Abdomen: soft, non-tender, without masses or organomegaly EXT:no erythema, induration, or nodules  Lab Results: Lab Results  Component Value Date   WBC 1.8* 12/08/2013   HGB 9.5* 12/08/2013   HCT 27.6* 12/08/2013   MCV 104.9* 12/08/2013   PLT 86*  12/08/2013     Chemistry      Component Value Date/Time   NA 136* 09/20/2013 0049   NA 137 01/05/2013 1501   K 3.5* 09/20/2013 0049   K 3.8 01/05/2013 1501   CL 97 09/20/2013 0049   CL 99 06/08/2012 1257   CO2 23 09/20/2013 0049   CO2 26 01/05/2013 1501   BUN 17 09/20/2013 0049   BUN 13.9 01/05/2013 1501   CREATININE 0.84 09/20/2013 0049   CREATININE 0.8 01/05/2013 1501      Component Value Date/Time   CALCIUM 9.2 09/20/2013 0049   CALCIUM 10.1 01/05/2013 1501   ALKPHOS 82 12/06/2012 1850   ALKPHOS 48 08/10/2012 1117   AST 17 12/06/2012 1850   AST 17 08/10/2012 1117   ALT <5 12/06/2012 1850   ALT 14 08/10/2012 1117   BILITOT 0.7 12/06/2012 1850   BILITOT 0.82  08/10/2012 1117      Impression and Plan:  This is a 80 year old gentleman with the following issues:  1. Follicular lymphoma diagnosed in 2007. He has no evidence to suggest recurrent disease. 2. Pancytopenia due to myelodysplastic syndrome. He is status post bone marrow biopsy was done on 06/22/2012.  He is to continue on Aranesp 300 mcg every 2 weeks. His hemoglobin is 9.5 today plan he will not require packed red blood cell transfusion.we will continue to monitor him closely and will assess him every 2 weeks.he will receive his Aranesp today. 3. Monoclonal gammopathy.  His M-spike had been less than 1 g/dL, his bone marrow biopsy continued to show a less than 8% plasma cells indicating most likely multiple myeloma.   4. Followup every 2 weeks for an injection possible transfusion. He will have a clinical visit in 12 weeks.  Awilda Metro E, PA-C  11/15/201512:33 PM

## 2013-12-10 NOTE — Patient Instructions (Signed)
Continue labs and injection appointments as scheduled with transfusions as needed Follow-up for another symptom management visit and about 3 months or sooner as needed

## 2013-12-12 ENCOUNTER — Telehealth: Payer: Self-pay | Admitting: Internal Medicine

## 2013-12-12 NOTE — Telephone Encounter (Signed)
He was having some nausea and went to cancer center on Fri.  So she sent a transmission last night around 6.  Just wanted to make sure all looked okay

## 2013-12-12 NOTE — Telephone Encounter (Signed)
New problem     Pt is experiencing chest heaviness with no chest pain and would like for daughter to be contacted at [phone]

## 2013-12-13 NOTE — Telephone Encounter (Signed)
[name] pulled the transmission up for me and said no changes from last transmission.  She will call back if she feels he needs to be seen

## 2013-12-14 ENCOUNTER — Telehealth: Payer: Self-pay | Admitting: Oncology

## 2013-12-14 NOTE — Telephone Encounter (Signed)
°    °    I have adjust all bloods to 10am except 11.27.15 due to chemo room booked and 2.5.16 due to MD has nothing earlier. All other dates have been changed. Please adjust all labs to be 1hr b4 blood.    This was sent to [facility] she was helping pt

## 2013-12-18 ENCOUNTER — Other Ambulatory Visit (INDEPENDENT_AMBULATORY_CARE_PROVIDER_SITE_OTHER): Payer: Medicare Other

## 2013-12-18 ENCOUNTER — Encounter: Payer: Self-pay | Admitting: Internal Medicine

## 2013-12-18 ENCOUNTER — Ambulatory Visit (INDEPENDENT_AMBULATORY_CARE_PROVIDER_SITE_OTHER)
Admission: RE | Admit: 2013-12-18 | Discharge: 2013-12-18 | Disposition: A | Discharge status: Home / Self Care | Payer: Medicare Other | Source: Ambulatory Visit | Attending: Internal Medicine | Admitting: Internal Medicine

## 2013-12-18 ENCOUNTER — Ambulatory Visit (INDEPENDENT_AMBULATORY_CARE_PROVIDER_SITE_OTHER): Payer: Medicare Other | Admitting: Internal Medicine

## 2013-12-18 VITALS — BP 120/82 | HR 96 | Ht 71.0 in | Wt 231.8 lb

## 2013-12-18 DIAGNOSIS — E1365 Other specified diabetes mellitus with hyperglycemia: Secondary | ICD-10-CM

## 2013-12-18 DIAGNOSIS — E039 Hypothyroidism, unspecified: Secondary | ICD-10-CM

## 2013-12-18 DIAGNOSIS — IMO0002 Reserved for concepts with insufficient information to code with codable children: Secondary | ICD-10-CM

## 2013-12-18 DIAGNOSIS — J45991 Cough variant asthma: Secondary | ICD-10-CM

## 2013-12-18 DIAGNOSIS — R0602 Shortness of breath: Secondary | ICD-10-CM

## 2013-12-18 LAB — BASIC METABOLIC PANEL
BUN: 16 mg/dL (ref 6–23)
CO2: 26 meq/L (ref 19–32)
CREATININE: 0.9 mg/dL (ref 0.4–1.5)
Calcium: 9.9 mg/dL (ref 8.4–10.5)
Chloride: 102 mEq/L (ref 96–112)
GFR: 86.44 mL/min (ref >60.00)
Glucose, Bld: 194 mg/dL — ABNORMAL HIGH (ref 70–99)
POTASSIUM: 3.4 meq/L — AB (ref 3.5–5.1)
Sodium: 139 mEq/L (ref 135–145)

## 2013-12-18 LAB — BRAIN NATRIURETIC PEPTIDE: Pro B Natriuretic peptide (BNP): 232 pg/mL — ABNORMAL HIGH (ref 0.0–100.0)

## 2013-12-18 LAB — HEMOGLOBIN A1C: HEMOGLOBIN A1C: 6.4 % (ref 4.6–6.5)

## 2013-12-18 LAB — TSH: TSH: 2.79 u[IU]/mL (ref 0.35–4.50)

## 2013-12-18 NOTE — Progress Notes (Signed)
Quick Note:  Spoke with pt and notified of results per Dr. Wert. Pt verbalized understanding and denied any questions.  ______ 

## 2013-12-18 NOTE — Patient Instructions (Signed)
Please remember to go to the lab and x-ray department downstairs for your tests - we will call you with the results when they are available.    Please schedule a follow up visit in 3 months but call sooner if needed to See Tammy NP

## 2013-12-18 NOTE — Progress Notes (Signed)
Subjective:     Patient ID: [name], male   DOB: 1932/10/12    MRN: [id number]  Brief patient profile:  49 yowm quit smoking 1970 with morbid obesity complicated by aodm, Lymphoma s/p chemo ([facility]) and tendency to peripheral edema that is felt to be dependent and related to obesity/ venous insuff.   History of Present Illness  09/05/2012 acute  ov/[name] new problem = rash, breathing and cough resolved Chief Complaint  Patient presents with  . Acute Visit    Pt c/o rash and burning sensation left shoulder radiating down left arm- started to notice about a wk ago  initially though to be shingles but rash is actually bilateral upper arms and abd wall, itching rather than painful, no assoc fever or ha, arthralgias  >>refer to derm , vistaril rx      Admit date: 10/27/2012  Discharge date: 11/08/2012  Primary Care Physician: Victor Gully, MD  Final Discharge Diagnoses:  Staph aureus bacteremia/sepsis of unclear etiology, likely with assumption that the pacemaker is contaminated  Secondary diagnosis . acute encephalopathy . Abdominal pain . Bacteremia due to Staphylococcus aureus . generalized debility  . Other pancytopenia . Hyponatremia . LYMPHOMA NEC, MLIG, INGUINAL/LOWER LIMB . Essential hypertension, benign . GERD . DM (diabetes mellitus), secondary uncontrolled      06/14/2013 f/u ov/[name] re: aodm/ asthma/hypothyroid Chief Complaint  Patient presents with  . Follow-up    Pt c/o incr SOB with some cough. Pt reports using candy to soothe throat to help reduce cough.   Not needing any saba - cough is dry and daytime not disturbing sleep, more of a throat clearing  >>Prevnar   09/19/2013 Follow Up / NP  Returns for follow up and Med review .  We reviewed all his medications and organized them into a medication calendar with patient education. Patient appears to be take his medications correctly. Does report some dry cough since last ov.   Denies any dyspnea,  wheezing, tightness, f/c/s, n/v/d, hemoptysis, PND, leg swelling Labs last ov showed good DM control with A1C at 6.1.  Does not check sugars on regular basis . No known low sugars . Does have some loose stools on/off.  Does take miralax most nights-we discussed using this as needed only . Average control for last 6 months 5.7-6.1  We discussed lowering the metformin dose.  Patient continues to follow with hematology and on for pancytopenia, along with a myelodysplastic syndrome. Remains on Dulera , denies flare in cough or wheezing.  Gets winded easily , does wear out with walking.  He receives Aranesp every 2 weeks, and intermittent transfusions. Last blood transfusion was one week ago. Did have another nose bleed, seen by ENT , required cauterization .  rec Decrease Metformin 500mg  1/2 Twice daily .  May try Activia yogurt daily .  Hold Nasonex for few weeks then use only As needed   Use saline nasal gel as directed and As needed     12/18/2013 f/u ov/[name] re: dm/obesity/ ? Cough variant asthma/hypothyroidism Chief Complaint  Patient presents with  . Follow-up    Cant walk too far without breathing heavy.  some productive cough with mucinex, but mostly having dry cough; weakness in right leg; cramping in right leg  cough p supper the worst, mostly dry, no dysphagia/ taking dulera 200 2bid with good hfa technique / no excess nasal drainage noted  No noct cough or need for saba/ using avg bid and helps breathing more than coughing  Not checking cbgs at all but no symptoms of high or low sugars  No obvious day to day or daytime variabilty or assoc excess or purulent sputum  or cp or chest tightness, subjective wheeze overt sinus or hb symptoms. No unusual exp hx or h/o childhood pna/ asthma or knowledge of premature birth.  Sleeping ok without nocturnal  or early am exacerbation  of respiratory  c/o's or need for noct saba. Also denies any obvious fluctuation of symptoms with weather or  environmental changes or other aggravating or alleviating factors except as outlined above   Current Medications, Allergies, Complete Past Medical History, Past Surgical History, Family History, and Social History were reviewed in [employer] record.          ROS  The following are not active complaints unless bolded sore throat, dysphagia, dental problems, itching, sneezing,  nasal congestion or excess/ purulent secretions, ear ache,   fever, chills, sweats, unintended wt loss, pleuritic or exertional cp, hemoptysis,  orthopnea pnd or leg swelling, presyncope, palpitations, heartburn, abdominal pain, anorexia, nausea, vomiting, diarrhea  or change in bowel or urinary habits, change in stools or urine, dysuria,hematuria,  rash, arthralgias, visual complaints, headache, numbness weakness generalized  or ataxia or problems with walking or coordination,  change in mood/affect or memory.                           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   [facility]  - MRI 11/23/2007 c/w L1-2 bulging disc indents the thecal sac with foraminal stenosis  HEALTH MAINTENANCE..........................................................................................[facility]  - Td 10/2005  - Pneumovax 10/07 second shot  - CPX 08/20/2010  --colon 02/2005 -int/extern. hems  (repeat q7y)  Complex med regimen  --Meds reviewed with pt education and computerized med calendar completed/adjusted. November 08, 2008 , August 21, 2009 updated 02/19/2011 , 10/23/2011 , 09/19/2013  Syncope...........................................................................................................[facility] KitchenHoch[facility]  - Mobitz II AV block s/p Medtronic pacemaker placement 12/11/2008  [facility]at[facility].....................................................................................................[facility]  - Pruritic rash 04/2009 > tried lotrisone, [facility] to [facility] August 06, 2009  Memory impairment , MMSE 27/30 8/25>refer to neuro   Family History:   mother had diabetes   Social History:  Patient states former smoker.  quit smoking in 1970  No ETOH  Retired            Objective:   Physical Exam wt 244 January 16, 2008 > 249 April 24, 2009 > 249 10/06/2010 >  11/27/2010  245 > 01/07/2011 241 > 02/19/11 238 > 06/04/2011  247 >07/03/2011 246 > 07/31/2011  248 > 09/11/2011  247 >246 10/23/2011 > 258 02/15/2012 > 05/19/2012  258 > 08/19/2012 255 >  266 09/05/12>  11/24/12 256>235 01/02/13 > 237  01/23/2013  237>>230 03/06/2013 >  06/14/13 234>234 09/19/2013 > 12/18/2013 231    amb obese wm in no acute distress / vital signs reviewed  HEENT: nl dentition and orophanx.  Nasal mucosa dry, Mucus membranes pink and moist.  No tongue or throat exudate.  Neck: supple with full ROM. no JVD, node enlargement or TMJ   Lungs: clear and equal bilateral breath sounds. No wheezing, rhonchi or rales - no cough on insp or exp  Chest: RRR. No murmurs, rubs, or gallops.   Tr- peripheral edema.  Abd: obese but soft and no rebound or focal tenderness. Normal excursion in the supine position.  Ext warm and dry, no calf tenderness, cyanosis or clubbing. mild/mod chronic venous insufficiency changes. Varicose veins present. Neuro: nl sensorium and gait.. Gets up from chair fine without hands, sym strength both legs   Skin:   No rash    CXR  12/18/2013 : 1. Cardiomegaly. Prior CABG. Cardiac pacer with lead tips in right atrium and right ventricle. 2. Slight hiatal hernia. 3. No acute pulmonary disease.   Recent Labs Lab 12/18/13 1013  NA 139  K 3.4*  CL 102  CO2 26  BUN 16  CREATININE 0.9  GLUCOSE 194*   Lab Results  Component Value Date   TSH 2.79 12/18/2013     Lab Results  Component Value Date   PROBNP 232.0* 12/18/2013         Lab Results  Component Value Date   HGBA1C 6.4 12/18/2013   HGBA1C 6.1 06/14/2013   HGBA1C 5.7 03/06/2013        Assessment:

## 2013-12-21 NOTE — Assessment & Plan Note (Signed)
Lab Results  Component Value Date   TSH 2.79 12/18/2013     Adequate control on present rx, reviewed > no change in rx needed

## 2013-12-21 NOTE — Assessment & Plan Note (Signed)
CT sinus 06/20/10 >>   Neg - 12/18/2013 p extensive coaching HFA effectiveness =    90% > try reduce  dulera 100 2bid and added pepcid p supper   Not clear whether this is really asthma or upper airway cough syndrome - if it's the latter the cough should be less with lower dose dulera, if this is asthma then likely would be worse > try the lower strength for at least 2 weeks to see the difference if any

## 2013-12-21 NOTE — Assessment & Plan Note (Signed)
Multifactorial but mostly obesity/ deconditioning    Each maintenance medication was reviewed in detail including most importantly the difference between maintenance and as needed and under what circumstances the prns are to be used. This was done in the context of a medication calendar review which provided the patient with a user-friendly unambiguous mechanism for medication administration and reconciliation and provides an action plan for all active problems. It is critical that this be shown to every doctor  for modification during the office visit if necessary so the patient can use it as a working document.

## 2013-12-21 NOTE — Assessment & Plan Note (Signed)
-   New onset 2012 while on freq doses of pred for airways dz >>Nutritionist referral 06/20/2010  And started on metfomin 07/02/10  Lab Results  Component Value Date   HGBA1C 6.4 12/18/2013   HGBA1C 6.1 06/14/2013   HGBA1C 5.7 03/06/2013       Adequate control on present rx, reviewed > no change in rx needed

## 2013-12-22 ENCOUNTER — Other Ambulatory Visit: Payer: Self-pay | Admitting: Medical Oncology

## 2013-12-22 ENCOUNTER — Other Ambulatory Visit (HOSPITAL_BASED_OUTPATIENT_CLINIC_OR_DEPARTMENT_OTHER): Payer: Medicare Other

## 2013-12-22 ENCOUNTER — Ambulatory Visit (HOSPITAL_BASED_OUTPATIENT_CLINIC_OR_DEPARTMENT_OTHER): Payer: Medicare Other

## 2013-12-22 ENCOUNTER — Ambulatory Visit: Payer: Medicare Other

## 2013-12-22 VITALS — BP 110/53 | HR 75 | Temp 98.6°F | Resp 18

## 2013-12-22 DIAGNOSIS — D61818 Other pancytopenia: Secondary | ICD-10-CM

## 2013-12-22 DIAGNOSIS — D469 Myelodysplastic syndrome, unspecified: Secondary | ICD-10-CM

## 2013-12-22 LAB — CBC WITH DIFFERENTIAL/PLATELET
BASO%: 0.3 % (ref 0.0–2.0)
Basophils Absolute: 0 10*3/uL (ref 0.0–0.1)
EOS%: 1 % (ref 0.0–7.0)
Eosinophils Absolute: 0 10*3/uL (ref 0.0–0.5)
HCT: 26.4 % — ABNORMAL LOW (ref 38.4–49.9)
HGB: 8.7 g/dL — ABNORMAL LOW (ref 13.0–17.1)
LYMPH%: 64.2 % — ABNORMAL HIGH (ref 14.0–49.0)
MCH: 36.5 pg — ABNORMAL HIGH (ref 27.2–33.4)
MCHC: 33.1 g/dL (ref 32.0–36.0)
MCV: 110.2 fL — ABNORMAL HIGH (ref 79.3–98.0)
MONO#: 0 10*3/uL — AB (ref 0.1–0.9)
MONO%: 0.8 % (ref 0.0–14.0)
NEUT%: 33.7 % — ABNORMAL LOW (ref 39.0–75.0)
NEUTROS ABS: 0.5 10*3/uL — AB (ref 1.5–6.5)
Platelets: 85 10*3/uL — ABNORMAL LOW (ref 140–400)
RBC: 2.39 10*6/uL — AB (ref 4.20–5.82)
RDW: 23.1 % — AB (ref 11.0–14.6)
WBC: 1.4 10*3/uL — AB (ref 4.0–10.3)
lymph#: 0.9 10*3/uL (ref 0.9–3.3)

## 2013-12-22 LAB — PREPARE RBC (CROSSMATCH)

## 2013-12-22 LAB — HOLD TUBE, BLOOD BANK

## 2013-12-22 MED ORDER — ACETAMINOPHEN 325 MG PO TABS
650.0000 mg | ORAL_TABLET | Freq: Once | ORAL | Status: AC
  Administered 2013-12-22: 650 mg via ORAL

## 2013-12-22 MED ORDER — ACETAMINOPHEN 325 MG PO TABS
ORAL_TABLET | ORAL | Status: AC
  Filled 2013-12-22: qty 2

## 2013-12-22 MED ORDER — DIPHENHYDRAMINE HCL 25 MG PO CAPS
ORAL_CAPSULE | ORAL | Status: AC
  Filled 2013-12-22: qty 1

## 2013-12-22 MED ORDER — HEPARIN SOD (PORK) LOCK FLUSH 100 UNIT/ML IV SOLN
250.0000 [IU] | INTRAVENOUS | Status: DC | PRN
  Filled 2013-12-22: qty 5

## 2013-12-22 MED ORDER — DARBEPOETIN ALFA 300 MCG/0.6ML IJ SOSY
300.0000 ug | PREFILLED_SYRINGE | Freq: Once | INTRAMUSCULAR | Status: AC
Start: 2013-12-22 — End: 2013-12-22
  Administered 2013-12-22: 300 ug via SUBCUTANEOUS
  Filled 2013-12-22: qty 0.6

## 2013-12-22 MED ORDER — FUROSEMIDE 10 MG/ML IJ SOLN
20.0000 mg | Freq: Once | INTRAMUSCULAR | Status: AC
  Administered 2013-12-22: 20 mg via INTRAVENOUS

## 2013-12-22 MED ORDER — SODIUM CHLORIDE 0.9 % IJ SOLN
10.0000 mL | INTRAMUSCULAR | Status: DC | PRN
  Filled 2013-12-22: qty 10

## 2013-12-22 MED ORDER — FUROSEMIDE 20 MG PO TABS
ORAL_TABLET | ORAL | Status: AC
Start: 2013-12-22 — End: 2013-12-22
  Filled 2013-12-22: qty 1

## 2013-12-22 MED ORDER — SODIUM CHLORIDE 0.9 % IV SOLN
250.0000 mL | Freq: Once | INTRAVENOUS | Status: AC
  Administered 2013-12-22: 250 mL via INTRAVENOUS

## 2013-12-22 MED ORDER — HEPARIN SOD (PORK) LOCK FLUSH 100 UNIT/ML IV SOLN
500.0000 [IU] | Freq: Every day | INTRAVENOUS | Status: DC | PRN
  Filled 2013-12-22: qty 5

## 2013-12-22 MED ORDER — SODIUM CHLORIDE 0.9 % IJ SOLN
3.0000 mL | INTRAMUSCULAR | Status: DC | PRN
  Filled 2013-12-22: qty 10

## 2013-12-22 MED ORDER — DIPHENHYDRAMINE HCL 25 MG PO CAPS
25.0000 mg | ORAL_CAPSULE | Freq: Once | ORAL | Status: AC
  Administered 2013-12-22: 25 mg via ORAL

## 2013-12-22 NOTE — Patient Instructions (Signed)
Darbepoetin Alfa injection What is this medicine? DARBEPOETIN ALFA (dar be POE e tin AL fa) helps your body make more red blood cells. It is used to treat anemia caused by chronic kidney failure and chemotherapy. This medicine may be used for other purposes; ask your health care provider or pharmacist if you have questions. COMMON BRAND NAME(S): Aranesp What should I tell my health care provider before I take this medicine? They need to know if you have any of these conditions: -blood clotting disorders or history of blood clots -cancer patient not on chemotherapy -cystic fibrosis -heart disease, such as angina, heart failure, or a history of a heart attack -hemoglobin level of 12 g/dL or greater -high blood pressure -low levels of folate, iron, or vitamin B12 -seizures -an unusual or allergic reaction to darbepoetin, erythropoietin, albumin, hamster proteins, latex, other medicines, foods, dyes, or preservatives -pregnant or trying to get pregnant -breast-feeding How should I use this medicine? This medicine is for injection into a vein or under the skin. It is usually given by a health care professional in a hospital or clinic setting. If you get this medicine at home, you will be taught how to prepare and give this medicine. Do not shake the solution before you withdraw a dose. Use exactly as directed. Take your medicine at regular intervals. Do not take your medicine more often than directed. It is important that you put your used needles and syringes in a special sharps container. Do not put them in a trash can. If you do not have a sharps container, call your pharmacist or healthcare provider to get one. Talk to your pediatrician regarding the use of this medicine in children. While this medicine may be used in children as young as 1 year for selected conditions, precautions do apply. Overdosage: If you think you have taken too much of this medicine contact a poison control center or  emergency room at once. NOTE: This medicine is only for you. Do not share this medicine with others. What if I miss a dose? If you miss a dose, take it as soon as you can. If it is almost time for your next dose, take only that dose. Do not take double or extra doses. What may interact with this medicine? Do not take this medicine with any of the following medications: -epoetin alfa This list may not describe all possible interactions. Give your health care provider a list of all the medicines, herbs, non-prescription drugs, or dietary supplements you use. Also tell them if you smoke, drink alcohol, or use illegal drugs. Some items may interact with your medicine. What should I watch for while using this medicine? Visit your prescriber or health care professional for regular checks on your progress and for the needed blood tests and blood pressure measurements. It is especially important for the doctor to make sure your hemoglobin level is in the desired range, to limit the risk of potential side effects and to give you the best benefit. Keep all appointments for any recommended tests. Check your blood pressure as directed. Ask your doctor what your blood pressure should be and when you should contact him or her. As your body makes more red blood cells, you may need to take iron, folic acid, or vitamin B supplements. Ask your doctor or health care provider which products are right for you. If you have kidney disease continue dietary restrictions, even though this medication can make you feel better. Talk with your doctor or health   care professional about the foods you eat and the vitamins that you take. What side effects may I notice from receiving this medicine? Side effects that you should report to your doctor or health care professional as soon as possible: -allergic reactions like skin rash, itching or hives, swelling of the face, lips, or tongue -breathing problems -changes in vision -chest  pain -confusion, trouble speaking or understanding -feeling faint or lightheaded, falls -high blood pressure -muscle aches or pains -pain, swelling, warmth in the leg -rapid weight gain -severe headaches -sudden numbness or weakness of the face, arm or leg -trouble walking, dizziness, loss of balance or coordination -seizures (convulsions) -swelling of the ankles, feet, hands -unusually weak or tired Side effects that usually do not require medical attention (report to your doctor or health care professional if they continue or are bothersome): -diarrhea -fever, chills (flu-like symptoms) -headaches -nausea, vomiting -redness, stinging, or swelling at site where injected This list may not describe all possible side effects. Call your doctor for medical advice about side effects. You may report side effects to FDA at 1-800-FDA-1088. Where should I keep my medicine? Keep out of the reach of children. Store in a refrigerator between 2 and 8 degrees C (36 and 46 degrees F). Do not freeze. Do not shake. Throw away any unused portion if using a single-dose vial. Throw away any unused medicine after the expiration date. NOTE: This sheet is a summary. It may not cover all possible information. If you have questions about this medicine, talk to your doctor, pharmacist, or health care provider.  2015, Elsevier/Gold Standard. (2007-12-27 10:23:57) Blood Transfusion Information WHAT IS A BLOOD TRANSFUSION? A transfusion is the replacement of blood or some of its parts. Blood is made up of multiple cells which provide different functions.  Red blood cells carry oxygen and are used for blood loss replacement.  White blood cells fight against infection.  Platelets control bleeding.  Plasma helps clot blood.  Other blood products are available for specialized needs, such as hemophilia or other clotting disorders. BEFORE THE TRANSFUSION  Who gives blood for transfusions?   You may be able to  donate blood to be used at a later date on yourself (autologous donation).  Relatives can be asked to donate blood. This is generally not any safer than if you have received blood from a stranger. The same precautions are taken to ensure safety when a relative's blood is donated.  Healthy volunteers who are fully evaluated to make sure their blood is safe. This is blood bank blood. Transfusion therapy is the safest it has ever been in the practice of medicine. Before blood is taken from a donor, a complete history is taken to make sure that person has no history of diseases nor engages in risky social behavior (examples are intravenous drug use or sexual activity with multiple partners). The donor's travel history is screened to minimize risk of transmitting infections, such as malaria. The donated blood is tested for signs of infectious diseases, such as HIV and hepatitis. The blood is then tested to be sure it is compatible with you in order to minimize the chance of a transfusion reaction. If you or a relative donates blood, this is often done in anticipation of surgery and is not appropriate for emergency situations. It takes many days to process the donated blood. RISKS AND COMPLICATIONS Although transfusion therapy is very safe and saves many lives, the main dangers of transfusion include:   Getting an infectious disease.    Developing a transfusion reaction. This is an allergic reaction to something in the blood you were given. Every precaution is taken to prevent this. The decision to have a blood transfusion has been considered carefully by your caregiver before blood is given. Blood is not given unless the benefits outweigh the risks. AFTER THE TRANSFUSION  Right after receiving a blood transfusion, you will usually feel much better and more energetic. This is especially true if your red blood cells have gotten low (anemic). The transfusion raises the level of the red blood cells which carry  oxygen, and this usually causes an energy increase.  The nurse administering the transfusion will monitor you carefully for complications. HOME CARE INSTRUCTIONS  No special instructions are needed after a transfusion. You may find your energy is better. Speak with your caregiver about any limitations on activity for underlying diseases you may have. SEEK MEDICAL CARE IF:   Your condition is not improving after your transfusion.  You develop redness or irritation at the intravenous (IV) site. SEEK IMMEDIATE MEDICAL CARE IF:  Any of the following symptoms occur over the next 12 hours:  Shaking chills.  You have a temperature by mouth above 102 F (38.9 C), not controlled by medicine.  Chest, back, or muscle pain.  People around you feel you are not acting correctly or are confused.  Shortness of breath or difficulty breathing.  Dizziness and fainting.  You get a rash or develop hives.  You have a decrease in urine output.  Your urine turns a dark color or changes to pink, red, or brown. Any of the following symptoms occur over the next 10 days:  You have a temperature by mouth above 102 F (38.9 C), not controlled by medicine.  Shortness of breath.  Weakness after normal activity.  The white part of the eye turns yellow (jaundice).  You have a decrease in the amount of urine or are urinating less often.  Your urine turns a dark color or changes to pink, red, or brown. Document Released: 01/10/2000 Document Revised: 04/06/2011 Document Reviewed: 08/29/2007 ExitCare Patient Information 2015 [employer]. This information is not intended to replace advice given to you by your health care provider. Make sure you discuss any questions you have with your health care provider.  

## 2013-12-23 LAB — TYPE AND SCREEN
ABO/RH(D): A POS
Antibody Screen: NEGATIVE
UNIT DIVISION: 0

## 2013-12-26 ENCOUNTER — Ambulatory Visit (HOSPITAL_COMMUNITY)
Admission: RE | Admit: 2013-12-26 | Discharge: 2013-12-26 | Disposition: A | Discharge status: Home / Self Care | Payer: Medicare Other | Source: Ambulatory Visit | Attending: Oncology | Admitting: Oncology

## 2013-12-26 DIAGNOSIS — D649 Anemia, unspecified: Secondary | ICD-10-CM

## 2013-12-26 DIAGNOSIS — D61818 Other pancytopenia: Secondary | ICD-10-CM | POA: Insufficient documentation

## 2014-01-04 ENCOUNTER — Other Ambulatory Visit: Payer: Self-pay | Admitting: *Deleted

## 2014-01-04 ENCOUNTER — Encounter (HOSPITAL_COMMUNITY): Payer: Self-pay | Admitting: Internal Medicine

## 2014-01-05 ENCOUNTER — Other Ambulatory Visit (HOSPITAL_BASED_OUTPATIENT_CLINIC_OR_DEPARTMENT_OTHER): Payer: Medicare Other

## 2014-01-05 ENCOUNTER — Ambulatory Visit (HOSPITAL_BASED_OUTPATIENT_CLINIC_OR_DEPARTMENT_OTHER): Payer: Medicare Other

## 2014-01-05 ENCOUNTER — Other Ambulatory Visit: Payer: Medicare Other

## 2014-01-05 ENCOUNTER — Ambulatory Visit: Payer: Medicare Other

## 2014-01-05 ENCOUNTER — Other Ambulatory Visit: Payer: Self-pay | Admitting: Medical Oncology

## 2014-01-05 VITALS — BP 136/76 | HR 67 | Temp 98.9°F | Resp 20

## 2014-01-05 DIAGNOSIS — D61818 Other pancytopenia: Secondary | ICD-10-CM

## 2014-01-05 DIAGNOSIS — D649 Anemia, unspecified: Secondary | ICD-10-CM

## 2014-01-05 DIAGNOSIS — D469 Myelodysplastic syndrome, unspecified: Secondary | ICD-10-CM

## 2014-01-05 LAB — CBC WITH DIFFERENTIAL/PLATELET
BASO%: 0 % (ref 0.0–2.0)
Basophils Absolute: 0 10*3/uL (ref 0.0–0.1)
EOS%: 0.7 % (ref 0.0–7.0)
Eosinophils Absolute: 0 10*3/uL (ref 0.0–0.5)
HEMATOCRIT: 25.5 % — AB (ref 38.4–49.9)
HEMOGLOBIN: 8.7 g/dL — AB (ref 13.0–17.1)
LYMPH%: 65.6 % — ABNORMAL HIGH (ref 14.0–49.0)
MCH: 35.7 pg — AB (ref 27.2–33.4)
MCHC: 34.1 g/dL (ref 32.0–36.0)
MCV: 104.5 fL — AB (ref 79.3–98.0)
MONO#: 0 10*3/uL — ABNORMAL LOW (ref 0.1–0.9)
MONO%: 0.7 % (ref 0.0–14.0)
NEUT#: 0.5 10*3/uL — CL (ref 1.5–6.5)
NEUT%: 33 % — AB (ref 39.0–75.0)
Platelets: 78 10*3/uL — ABNORMAL LOW (ref 140–400)
RBC: 2.44 10*6/uL — ABNORMAL LOW (ref 4.20–5.82)
RDW: 19.4 % — ABNORMAL HIGH (ref 11.0–14.6)
WBC: 1.5 10*3/uL — ABNORMAL LOW (ref 4.0–10.3)
lymph#: 1 10*3/uL (ref 0.9–3.3)
nRBC: 0 % (ref 0–0)

## 2014-01-05 LAB — PREPARE RBC (CROSSMATCH)

## 2014-01-05 LAB — HOLD TUBE, BLOOD BANK

## 2014-01-05 MED ORDER — ACETAMINOPHEN 325 MG PO TABS
650.0000 mg | ORAL_TABLET | Freq: Once | ORAL | Status: AC
  Administered 2014-01-05: 650 mg via ORAL

## 2014-01-05 MED ORDER — DIPHENHYDRAMINE HCL 25 MG PO CAPS
ORAL_CAPSULE | ORAL | Status: AC
  Filled 2014-01-05: qty 1

## 2014-01-05 MED ORDER — FUROSEMIDE 10 MG/ML IJ SOLN
20.0000 mg | Freq: Once | INTRAMUSCULAR | Status: AC
  Administered 2014-01-05: 20 mg via INTRAVENOUS

## 2014-01-05 MED ORDER — ACETAMINOPHEN 325 MG PO TABS
ORAL_TABLET | ORAL | Status: AC
  Filled 2014-01-05: qty 2

## 2014-01-05 MED ORDER — DIPHENHYDRAMINE HCL 25 MG PO CAPS
25.0000 mg | ORAL_CAPSULE | Freq: Once | ORAL | Status: AC
Start: 2014-01-05 — End: 2014-01-05
  Administered 2014-01-05: 25 mg via ORAL

## 2014-01-05 MED ORDER — SODIUM CHLORIDE 0.9 % IV SOLN
250.0000 mL | Freq: Once | INTRAVENOUS | Status: AC
  Administered 2014-01-05: 250 mL via INTRAVENOUS

## 2014-01-05 MED ORDER — DARBEPOETIN ALFA 300 MCG/0.6ML IJ SOSY
300.0000 ug | PREFILLED_SYRINGE | Freq: Once | INTRAMUSCULAR | Status: AC
  Administered 2014-01-05: 300 ug via SUBCUTANEOUS
  Filled 2014-01-05: qty 0.6

## 2014-01-05 NOTE — Patient Instructions (Signed)

## 2014-01-06 LAB — TYPE AND SCREEN
ABO/RH(D): A POS
Antibody Screen: NEGATIVE
Unit division: 0

## 2014-01-10 ENCOUNTER — Encounter: Payer: Self-pay | Admitting: Internal Medicine

## 2014-01-10 ENCOUNTER — Ambulatory Visit (INDEPENDENT_AMBULATORY_CARE_PROVIDER_SITE_OTHER): Payer: Medicare Other | Admitting: *Deleted

## 2014-01-10 DIAGNOSIS — I442 Atrioventricular block, complete: Secondary | ICD-10-CM

## 2014-01-10 NOTE — Progress Notes (Signed)
Remote ICD transmission.   

## 2014-01-11 LAB — MDC_IDC_ENUM_SESS_TYPE_REMOTE
Battery Voltage: 2.78 V
Brady Statistic AP VP Percent: 1 %
Brady Statistic AP VS Percent: 0 %
Brady Statistic AS VP Percent: 99 %
Lead Channel Impedance Value: 433 Ohm
Lead Channel Impedance Value: 473 Ohm
Lead Channel Pacing Threshold Amplitude: 0.625 V
Lead Channel Pacing Threshold Amplitude: 1 V
Lead Channel Sensing Intrinsic Amplitude: 1.4 mV
Lead Channel Setting Pacing Amplitude: 2 V
Lead Channel Setting Pacing Amplitude: 2.5 V
Lead Channel Setting Pacing Pulse Width: 0.4 ms
Lead Channel Setting Sensing Sensitivity: 4 mV
MDC IDC MSMT BATTERY IMPEDANCE: 100 Ohm
MDC IDC MSMT BATTERY REMAINING LONGEVITY: 129 mo
MDC IDC MSMT LEADCHNL RA PACING THRESHOLD PULSEWIDTH: 0.4 ms
MDC IDC MSMT LEADCHNL RV PACING THRESHOLD PULSEWIDTH: 0.4 ms
MDC IDC SESS DTM: 20151216133013
MDC IDC STAT BRADY AS VS PERCENT: 0 %

## 2014-01-18 ENCOUNTER — Other Ambulatory Visit: Payer: Medicare Other

## 2014-01-18 ENCOUNTER — Telehealth: Payer: Self-pay | Admitting: Oncology

## 2014-01-18 ENCOUNTER — Ambulatory Visit: Payer: Medicare Other

## 2014-01-18 ENCOUNTER — Other Ambulatory Visit (HOSPITAL_BASED_OUTPATIENT_CLINIC_OR_DEPARTMENT_OTHER): Payer: Medicare Other

## 2014-01-18 ENCOUNTER — Telehealth: Payer: Self-pay | Admitting: Medical Oncology

## 2014-01-18 DIAGNOSIS — D469 Myelodysplastic syndrome, unspecified: Secondary | ICD-10-CM

## 2014-01-18 DIAGNOSIS — D61818 Other pancytopenia: Secondary | ICD-10-CM

## 2014-01-18 LAB — CBC WITH DIFFERENTIAL/PLATELET
BASO%: 0.3 % (ref 0.0–2.0)
BASOS ABS: 0 10*3/uL (ref 0.0–0.1)
EOS%: 1.1 % (ref 0.0–7.0)
Eosinophils Absolute: 0 10*3/uL (ref 0.0–0.5)
HEMATOCRIT: 28 % — AB (ref 38.4–49.9)
HEMOGLOBIN: 9.5 g/dL — AB (ref 13.0–17.1)
LYMPH#: 1 10*3/uL (ref 0.9–3.3)
LYMPH%: 63.4 % — ABNORMAL HIGH (ref 14.0–49.0)
MCH: 35.8 pg — ABNORMAL HIGH (ref 27.2–33.4)
MCHC: 33.8 g/dL (ref 32.0–36.0)
MCV: 105.7 fL — AB (ref 79.3–98.0)
MONO#: 0 10*3/uL — ABNORMAL LOW (ref 0.1–0.9)
MONO%: 0.9 % (ref 0.0–14.0)
NEUT%: 34.3 % — ABNORMAL LOW (ref 39.0–75.0)
NEUTROS ABS: 0.6 10*3/uL — AB (ref 1.5–6.5)
Platelets: 91 10*3/uL — ABNORMAL LOW (ref 140–400)
RBC: 2.65 10*6/uL — ABNORMAL LOW (ref 4.20–5.82)
RDW: 23.4 % — AB (ref 11.0–14.6)
WBC: 1.6 10*3/uL — ABNORMAL LOW (ref 4.0–10.3)

## 2014-01-18 LAB — HOLD TUBE, BLOOD BANK

## 2014-01-18 NOTE — Telephone Encounter (Signed)
Patient here for labs and possible blood transfusion. Hgb @ 9.5, informed him no blood per MD. Patient went home. Did not realize pt had appt for aranesp injection today.   Schedulers to r/s pt for injection Monday. MD informed.  Onc tx sent.

## 2014-01-18 NOTE — Telephone Encounter (Signed)
Lft msg for pt confirming inj r/s to 12/28 per 12/24 POF... KJ

## 2014-01-22 ENCOUNTER — Ambulatory Visit (HOSPITAL_BASED_OUTPATIENT_CLINIC_OR_DEPARTMENT_OTHER): Payer: Medicare Other

## 2014-01-22 ENCOUNTER — Other Ambulatory Visit: Payer: Self-pay

## 2014-01-22 DIAGNOSIS — D61818 Other pancytopenia: Secondary | ICD-10-CM

## 2014-01-22 DIAGNOSIS — D469 Myelodysplastic syndrome, unspecified: Secondary | ICD-10-CM

## 2014-01-22 MED ORDER — DARBEPOETIN ALFA 300 MCG/0.6ML IJ SOSY
300.0000 ug | PREFILLED_SYRINGE | Freq: Once | INTRAMUSCULAR | Status: AC
  Administered 2014-01-22: 300 ug via SUBCUTANEOUS
  Filled 2014-01-22: qty 0.6

## 2014-01-22 NOTE — Patient Instructions (Signed)
Darbepoetin Alfa injection What is this medicine? DARBEPOETIN ALFA (dar be POE e tin AL fa) helps your body make more red blood cells. It is used to treat anemia caused by chronic kidney failure and chemotherapy. This medicine may be used for other purposes; ask your health care provider or pharmacist if you have questions. COMMON BRAND NAME(S): Aranesp What should I tell my health care provider before I take this medicine? They need to know if you have any of these conditions: -blood clotting disorders or history of blood clots -cancer patient not on chemotherapy -cystic fibrosis -heart disease, such as angina, heart failure, or a history of a heart attack -hemoglobin level of 12 g/dL or greater -high blood pressure -low levels of folate, iron, or vitamin B12 -seizures -an unusual or allergic reaction to darbepoetin, erythropoietin, albumin, hamster proteins, latex, other medicines, foods, dyes, or preservatives -pregnant or trying to get pregnant -breast-feeding How should I use this medicine? This medicine is for injection into a vein or under the skin. It is usually given by a health care professional in a hospital or clinic setting. If you get this medicine at home, you will be taught how to prepare and give this medicine. Do not shake the solution before you withdraw a dose. Use exactly as directed. Take your medicine at regular intervals. Do not take your medicine more often than directed. It is important that you put your used needles and syringes in a special sharps container. Do not put them in a trash can. If you do not have a sharps container, call your pharmacist or healthcare provider to get one. Talk to your pediatrician regarding the use of this medicine in children. While this medicine may be used in children as young as 1 year for selected conditions, precautions do apply. Overdosage: If you think you have taken too much of this medicine contact a poison control center or  emergency room at once. NOTE: This medicine is only for you. Do not share this medicine with others. What if I miss a dose? If you miss a dose, take it as soon as you can. If it is almost time for your next dose, take only that dose. Do not take double or extra doses. What may interact with this medicine? Do not take this medicine with any of the following medications: -epoetin alfa This list may not describe all possible interactions. Give your health care provider a list of all the medicines, herbs, non-prescription drugs, or dietary supplements you use. Also tell them if you smoke, drink alcohol, or use illegal drugs. Some items may interact with your medicine. What should I watch for while using this medicine? Visit your prescriber or health care professional for regular checks on your progress and for the needed blood tests and blood pressure measurements. It is especially important for the doctor to make sure your hemoglobin level is in the desired range, to limit the risk of potential side effects and to give you the best benefit. Keep all appointments for any recommended tests. Check your blood pressure as directed. Ask your doctor what your blood pressure should be and when you should contact him or her. As your body makes more red blood cells, you may need to take iron, folic acid, or vitamin B supplements. Ask your doctor or health care provider which products are right for you. If you have kidney disease continue dietary restrictions, even though this medication can make you feel better. Talk with your doctor or health   care professional about the foods you eat and the vitamins that you take. What side effects may I notice from receiving this medicine? Side effects that you should report to your doctor or health care professional as soon as possible: -allergic reactions like skin rash, itching or hives, swelling of the face, lips, or tongue -breathing problems -changes in vision -chest  pain -confusion, trouble speaking or understanding -feeling faint or lightheaded, falls -high blood pressure -muscle aches or pains -pain, swelling, warmth in the leg -rapid weight gain -severe headaches -sudden numbness or weakness of the face, arm or leg -trouble walking, dizziness, loss of balance or coordination -seizures (convulsions) -swelling of the ankles, feet, hands -unusually weak or tired Side effects that usually do not require medical attention (report to your doctor or health care professional if they continue or are bothersome): -diarrhea -fever, chills (flu-like symptoms) -headaches -nausea, vomiting -redness, stinging, or swelling at site where injected This list may not describe all possible side effects. Call your doctor for medical advice about side effects. You may report side effects to FDA at 1-800-FDA-1088. Where should I keep my medicine? Keep out of the reach of children. Store in a refrigerator between 2 and 8 degrees C (36 and 46 degrees F). Do not freeze. Do not shake. Throw away any unused portion if using a single-dose vial. Throw away any unused medicine after the expiration date. NOTE: This sheet is a summary. It may not cover all possible information. If you have questions about this medicine, talk to your doctor, pharmacist, or health care provider.  2015, [employer]vier/[employer] Standard. (2007-12-27 10:23:57)  

## 2014-01-24 ENCOUNTER — Other Ambulatory Visit: Payer: Self-pay | Admitting: Internal Medicine

## 2014-01-27 ENCOUNTER — Ambulatory Visit (HOSPITAL_COMMUNITY)
Admission: RE | Admit: 2014-01-27 | Discharge: 2014-01-27 | Disposition: A | Discharge status: Home / Self Care | Payer: Medicare Other | Source: Ambulatory Visit | Attending: Oncology | Admitting: Oncology

## 2014-01-27 DIAGNOSIS — D61818 Other pancytopenia: Secondary | ICD-10-CM | POA: Insufficient documentation

## 2014-01-27 DIAGNOSIS — D649 Anemia, unspecified: Secondary | ICD-10-CM

## 2014-02-01 ENCOUNTER — Encounter: Payer: Self-pay | Admitting: Cardiology

## 2014-02-01 ENCOUNTER — Other Ambulatory Visit: Payer: Self-pay | Admitting: Internal Medicine

## 2014-02-02 ENCOUNTER — Ambulatory Visit (HOSPITAL_BASED_OUTPATIENT_CLINIC_OR_DEPARTMENT_OTHER): Payer: Medicare Other

## 2014-02-02 ENCOUNTER — Ambulatory Visit: Payer: Medicare Other

## 2014-02-02 ENCOUNTER — Other Ambulatory Visit: Payer: Self-pay | Admitting: *Deleted

## 2014-02-02 ENCOUNTER — Other Ambulatory Visit: Payer: Medicare Other

## 2014-02-02 ENCOUNTER — Other Ambulatory Visit (HOSPITAL_BASED_OUTPATIENT_CLINIC_OR_DEPARTMENT_OTHER): Payer: Medicare Other

## 2014-02-02 VITALS — BP 130/71 | HR 70 | Temp 97.0°F | Resp 20

## 2014-02-02 DIAGNOSIS — D649 Anemia, unspecified: Secondary | ICD-10-CM

## 2014-02-02 DIAGNOSIS — D61818 Other pancytopenia: Secondary | ICD-10-CM | POA: Diagnosis present

## 2014-02-02 DIAGNOSIS — D469 Myelodysplastic syndrome, unspecified: Secondary | ICD-10-CM

## 2014-02-02 LAB — CBC WITH DIFFERENTIAL/PLATELET
BASO%: 0.6 % (ref 0.0–2.0)
BASOS ABS: 0 10*3/uL (ref 0.0–0.1)
EOS%: 1.3 % (ref 0.0–7.0)
Eosinophils Absolute: 0 10*3/uL (ref 0.0–0.5)
HCT: 24.5 % — ABNORMAL LOW (ref 38.4–49.9)
HGB: 8.3 g/dL — ABNORMAL LOW (ref 13.0–17.1)
LYMPH#: 0.8 10*3/uL — AB (ref 0.9–3.3)
LYMPH%: 52.2 % — ABNORMAL HIGH (ref 14.0–49.0)
MCH: 35.9 pg — ABNORMAL HIGH (ref 27.2–33.4)
MCHC: 33.9 g/dL (ref 32.0–36.0)
MCV: 106.1 fL — ABNORMAL HIGH (ref 79.3–98.0)
MONO#: 0 10*3/uL — AB (ref 0.1–0.9)
MONO%: 1.3 % (ref 0.0–14.0)
NEUT#: 0.7 10*3/uL — ABNORMAL LOW (ref 1.5–6.5)
NEUT%: 44.6 % (ref 39.0–75.0)
Platelets: 80 10*3/uL — ABNORMAL LOW (ref 140–400)
RBC: 2.31 10*6/uL — ABNORMAL LOW (ref 4.20–5.82)
RDW: 20.4 % — ABNORMAL HIGH (ref 11.0–14.6)
WBC: 1.6 10*3/uL — ABNORMAL LOW (ref 4.0–10.3)

## 2014-02-02 LAB — PREPARE RBC (CROSSMATCH)

## 2014-02-02 LAB — HOLD TUBE, BLOOD BANK

## 2014-02-02 MED ORDER — SODIUM CHLORIDE 0.9 % IV SOLN
250.0000 mL | Freq: Once | INTRAVENOUS | Status: AC
  Administered 2014-02-02: 250 mL via INTRAVENOUS

## 2014-02-02 MED ORDER — DIPHENHYDRAMINE HCL 25 MG PO CAPS
25.0000 mg | ORAL_CAPSULE | Freq: Once | ORAL | Status: AC
  Administered 2014-02-02: 25 mg via ORAL

## 2014-02-02 MED ORDER — DARBEPOETIN ALFA 300 MCG/0.6ML IJ SOSY
300.0000 ug | PREFILLED_SYRINGE | Freq: Once | INTRAMUSCULAR | Status: AC
  Administered 2014-02-02: 300 ug via SUBCUTANEOUS
  Filled 2014-02-02: qty 0.6

## 2014-02-02 MED ORDER — ACETAMINOPHEN 325 MG PO TABS
ORAL_TABLET | ORAL | Status: AC
  Filled 2014-02-02: qty 2

## 2014-02-02 MED ORDER — ACETAMINOPHEN 325 MG PO TABS
650.0000 mg | ORAL_TABLET | Freq: Once | ORAL | Status: AC
  Administered 2014-02-02: 650 mg via ORAL

## 2014-02-02 MED ORDER — DIPHENHYDRAMINE HCL 25 MG PO CAPS
ORAL_CAPSULE | ORAL | Status: AC
  Filled 2014-02-02: qty 1

## 2014-02-02 NOTE — Patient Instructions (Signed)

## 2014-02-03 LAB — TYPE AND SCREEN
ABO/RH(D): A POS
Antibody Screen: NEGATIVE
UNIT DIVISION: 0
UNIT DIVISION: 0

## 2014-02-15 ENCOUNTER — Encounter: Payer: Self-pay | Admitting: Cardiology

## 2014-02-16 ENCOUNTER — Other Ambulatory Visit (HOSPITAL_BASED_OUTPATIENT_CLINIC_OR_DEPARTMENT_OTHER): Payer: Medicare Other

## 2014-02-16 ENCOUNTER — Other Ambulatory Visit: Payer: Medicare Other

## 2014-02-16 ENCOUNTER — Ambulatory Visit: Payer: Medicare Other

## 2014-02-16 ENCOUNTER — Ambulatory Visit (HOSPITAL_BASED_OUTPATIENT_CLINIC_OR_DEPARTMENT_OTHER): Payer: Medicare Other

## 2014-02-16 DIAGNOSIS — D469 Myelodysplastic syndrome, unspecified: Secondary | ICD-10-CM

## 2014-02-16 DIAGNOSIS — D61818 Other pancytopenia: Secondary | ICD-10-CM

## 2014-02-16 LAB — CBC WITH DIFFERENTIAL/PLATELET
BASO%: 0 % (ref 0.0–2.0)
BASOS ABS: 0 10*3/uL (ref 0.0–0.1)
EOS%: 1.3 % (ref 0.0–7.0)
Eosinophils Absolute: 0 10*3/uL (ref 0.0–0.5)
HCT: 28.3 % — ABNORMAL LOW (ref 38.4–49.9)
HGB: 9.7 g/dL — ABNORMAL LOW (ref 13.0–17.1)
LYMPH%: 53.5 % — ABNORMAL HIGH (ref 14.0–49.0)
MCH: 35.3 pg — ABNORMAL HIGH (ref 27.2–33.4)
MCHC: 34.3 g/dL (ref 32.0–36.0)
MCV: 102.9 fL — ABNORMAL HIGH (ref 79.3–98.0)
MONO#: 0 10*3/uL — ABNORMAL LOW (ref 0.1–0.9)
MONO%: 0.6 % (ref 0.0–14.0)
NEUT#: 0.7 10*3/uL — ABNORMAL LOW (ref 1.5–6.5)
NEUT%: 44.6 % (ref 39.0–75.0)
PLATELETS: 81 10*3/uL — AB (ref 140–400)
RBC: 2.75 10*6/uL — ABNORMAL LOW (ref 4.20–5.82)
RDW: 21.3 % — ABNORMAL HIGH (ref 11.0–14.6)
WBC: 1.6 10*3/uL — ABNORMAL LOW (ref 4.0–10.3)
lymph#: 0.9 10*3/uL (ref 0.9–3.3)

## 2014-02-16 LAB — HOLD TUBE, BLOOD BANK

## 2014-02-16 MED ORDER — DARBEPOETIN ALFA 300 MCG/0.6ML IJ SOSY
300.0000 ug | PREFILLED_SYRINGE | Freq: Once | INTRAMUSCULAR | Status: AC
  Administered 2014-02-16: 300 ug via SUBCUTANEOUS
  Filled 2014-02-16: qty 0.6

## 2014-02-16 NOTE — Patient Instructions (Signed)
Darbepoetin Alfa injection What is this medicine? DARBEPOETIN ALFA (dar be POE e tin AL fa) helps your body make more red blood cells. It is used to treat anemia caused by chronic kidney failure and chemotherapy. This medicine may be used for other purposes; ask your health care provider or pharmacist if you have questions. COMMON BRAND NAME(S): Aranesp What should I tell my health care provider before I take this medicine? They need to know if you have any of these conditions: -blood clotting disorders or history of blood clots -cancer patient not on chemotherapy -cystic fibrosis -heart disease, such as angina, heart failure, or a history of a heart attack -hemoglobin level of 12 g/dL or greater -high blood pressure -low levels of folate, iron, or vitamin B12 -seizures -an unusual or allergic reaction to darbepoetin, erythropoietin, albumin, hamster proteins, latex, other medicines, foods, dyes, or preservatives -pregnant or trying to get pregnant -breast-feeding How should I use this medicine? This medicine is for injection into a vein or under the skin. It is usually given by a health care professional in a hospital or clinic setting. If you get this medicine at home, you will be taught how to prepare and give this medicine. Do not shake the solution before you withdraw a dose. Use exactly as directed. Take your medicine at regular intervals. Do not take your medicine more often than directed. It is important that you put your used needles and syringes in a special sharps container. Do not put them in a trash can. If you do not have a sharps container, call your pharmacist or healthcare provider to get one. Talk to your pediatrician regarding the use of this medicine in children. While this medicine may be used in children as young as 1 year for selected conditions, precautions do apply. Overdosage: If you think you have taken too much of this medicine contact a poison control center or  emergency room at once. NOTE: This medicine is only for you. Do not share this medicine with others. What if I miss a dose? If you miss a dose, take it as soon as you can. If it is almost time for your next dose, take only that dose. Do not take double or extra doses. What may interact with this medicine? Do not take this medicine with any of the following medications: -epoetin alfa This list may not describe all possible interactions. Give your health care provider a list of all the medicines, herbs, non-prescription drugs, or dietary supplements you use. Also tell them if you smoke, drink alcohol, or use illegal drugs. Some items may interact with your medicine. What should I watch for while using this medicine? Visit your prescriber or health care professional for regular checks on your progress and for the needed blood tests and blood pressure measurements. It is especially important for the doctor to make sure your hemoglobin level is in the desired range, to limit the risk of potential side effects and to give you the best benefit. Keep all appointments for any recommended tests. Check your blood pressure as directed. Ask your doctor what your blood pressure should be and when you should contact him or her. As your body makes more red blood cells, you may need to take iron, folic acid, or vitamin B supplements. Ask your doctor or health care provider which products are right for you. If you have kidney disease continue dietary restrictions, even though this medication can make you feel better. Talk with your doctor or health   care professional about the foods you eat and the vitamins that you take. What side effects may I notice from receiving this medicine? Side effects that you should report to your doctor or health care professional as soon as possible: -allergic reactions like skin rash, itching or hives, swelling of the face, lips, or tongue -breathing problems -changes in vision -chest  pain -confusion, trouble speaking or understanding -feeling faint or lightheaded, falls -high blood pressure -muscle aches or pains -pain, swelling, warmth in the leg -rapid weight gain -severe headaches -sudden numbness or weakness of the face, arm or leg -trouble walking, dizziness, loss of balance or coordination -seizures (convulsions) -swelling of the ankles, feet, hands -unusually weak or tired Side effects that usually do not require medical attention (report to your doctor or health care professional if they continue or are bothersome): -diarrhea -fever, chills (flu-like symptoms) -headaches -nausea, vomiting -redness, stinging, or swelling at site where injected This list may not describe all possible side effects. Call your doctor for medical advice about side effects. You may report side effects to FDA at 1-800-FDA-1088. Where should I keep my medicine? Keep out of the reach of children. Store in a refrigerator between 2 and 8 degrees C (36 and 46 degrees F). Do not freeze. Do not shake. Throw away any unused portion if using a single-dose vial. Throw away any unused medicine after the expiration date. NOTE: This sheet is a summary. It may not cover all possible information. If you have questions about this medicine, talk to your doctor, pharmacist, or health care provider.  2015, [employer]vier/[employer] Standard. (2007-12-27 10:23:57)  

## 2014-02-16 NOTE — Progress Notes (Signed)
Aranesp given as ordered.

## 2014-02-16 NOTE — Progress Notes (Signed)
Aranesp injection given in treatment room

## 2014-02-16 NOTE — Progress Notes (Signed)
Labs reviewed with  Dr. [name]. HGB 9.7 and Hct 28.3. No transfusion needed at this time. Pt to get Aranesp Injection  To be given.

## 2014-02-20 ENCOUNTER — Ambulatory Visit (INDEPENDENT_AMBULATORY_CARE_PROVIDER_SITE_OTHER): Payer: Medicare Other | Admitting: Adult Health

## 2014-02-20 ENCOUNTER — Encounter: Payer: Self-pay | Admitting: Adult Health

## 2014-02-20 VITALS — BP 116/74 | HR 79 | Temp 97.9°F | Ht 71.0 in | Wt 227.0 lb

## 2014-02-20 DIAGNOSIS — E1365 Other specified diabetes mellitus with hyperglycemia: Secondary | ICD-10-CM

## 2014-02-20 DIAGNOSIS — I872 Venous insufficiency (chronic) (peripheral): Secondary | ICD-10-CM

## 2014-02-20 DIAGNOSIS — IMO0002 Reserved for concepts with insufficient information to code with codable children: Secondary | ICD-10-CM

## 2014-02-20 DIAGNOSIS — D61818 Other pancytopenia: Secondary | ICD-10-CM

## 2014-02-20 DIAGNOSIS — E039 Hypothyroidism, unspecified: Secondary | ICD-10-CM

## 2014-02-20 DIAGNOSIS — J4530 Mild persistent asthma, uncomplicated: Secondary | ICD-10-CM

## 2014-02-20 MED ORDER — ALBUTEROL SULFATE HFA 108 (90 BASE) MCG/ACT IN AERS: 1.0000 | INHALATION_SPRAY | RESPIRATORY_TRACT | Status: DC | PRN

## 2014-02-20 NOTE — Assessment & Plan Note (Signed)
Controlled on current regimen Advised on diet and weight loss  Plan No changes

## 2014-02-20 NOTE — Progress Notes (Signed)
Subjective:     Patient ID: [name], male   DOB: 1932/10/12    MRN: [id number]  Brief patient profile:  49 yowm quit smoking 1970 with morbid obesity complicated by aodm, Lymphoma s/p chemo ([facility]) and tendency to peripheral edema that is felt to be dependent and related to obesity/ venous insuff.   History of Present Illness  09/05/2012 acute  ov/Wert new problem = rash, breathing and cough resolved Chief Complaint  Patient presents with  . Acute Visit    Pt c/o rash and burning sensation left shoulder radiating down left arm- started to notice about a wk ago  initially though to be shingles but rash is actually bilateral upper arms and abd wall, itching rather than painful, no assoc fever or ha, arthralgias  >>refer to derm , vistaril rx      Admit date: 10/27/2012  Discharge date: 11/08/2012  Primary Care Physician: Christinia Gully, MD  Final Discharge Diagnoses:  Staph aureus bacteremia/sepsis of unclear etiology, likely with assumption that the pacemaker is contaminated  Secondary diagnosis . acute encephalopathy . Abdominal pain . Bacteremia due to Staphylococcus aureus . generalized debility  . Other pancytopenia . Hyponatremia . LYMPHOMA NEC, MLIG, INGUINAL/LOWER LIMB . Essential hypertension, benign . GERD . DM (diabetes mellitus), secondary uncontrolled      06/14/2013 f/u ov/Wert re: aodm/ asthma/hypothyroid Chief Complaint  Patient presents with  . Follow-up    Pt c/o incr SOB with some cough. Pt reports using candy to soothe throat to help reduce cough.   Not needing any saba - cough is dry and daytime not disturbing sleep, more of a throat clearing  >>Prevnar   09/19/2013 Follow Up / NP  Returns for follow up and Med review .  We reviewed all his medications and organized them into a medication calendar with patient education. Patient appears to be take his medications correctly. Does report some dry cough since last ov.   Denies any dyspnea,  wheezing, tightness, f/c/s, n/v/d, hemoptysis, PND, leg swelling Labs last ov showed good DM control with A1C at 6.1.  Does not check sugars on regular basis . No known low sugars . Does have some loose stools on/off.  Does take miralax most nights-we discussed using this as needed only . Average control for last 6 months 5.7-6.1  We discussed lowering the metformin dose.  Patient continues to follow with hematology and on for pancytopenia, along with a myelodysplastic syndrome. Remains on Dulera , denies flare in cough or wheezing.  Gets winded easily , does wear out with walking.  He receives Aranesp every 2 weeks, and intermittent transfusions. Last blood transfusion was one week ago. Did have another nose bleed, seen by ENT , required cauterization .  rec Decrease Metformin 500mg  1/2 Twice daily .  May try Activia yogurt daily .  Hold Nasonex for few weeks then use only As needed   Use saline nasal gel as directed and As needed     12/18/2013 f/u ov/Wert re: dm/obesity/ ? Cough variant asthma/hypothyroidism Chief Complaint  Patient presents with  . Follow-up    Cant walk too far without breathing heavy.  some productive cough with mucinex, but mostly having dry cough; weakness in right leg; cramping in right leg  cough p supper the worst, mostly dry, no dysphagia/ taking dulera 200 2bid with good hfa technique / no excess nasal drainage noted  No noct cough or need for saba/ using avg bid and helps breathing more than coughing  Not checking cbgs at all but no symptoms of high or low sugars >>  02/20/2014 Follow up : dm/obesity/ ? Cough variant asthma/hypothyroidism Pt returns for a 3 month follow up.  Has chronic DOE and fatigue are unchanged-"slowing down, can't do as much. "  Asthma doing well on Dulera, tried lower dose to see if dry cough better but no change. No flare of wheezing or cough .  No increased SABA use.  GERD doing well on PPI /Pepcid with no flare of heartburn.   DM doing well with A1C last ov at goal, advised on diet and weight .  Pancytopenia /?early myelodysplastic syndrome followed by Hem/Onc requiring occasional blood transfusion and Aranesp .  Patient denies chest pain, orthopnea, PND, increased leg swelling, or fever.  Current Medications, Allergies, Complete Past Medical History, Past Surgical History, Family History, and Social History were reviewed in [employer] record.        ROS  The following are not active complaints unless bolded sore throat, dysphagia, dental problems, itching, sneezing,  nasal congestion or excess/ purulent secretions, ear ache,   fever, chills, sweats, unintended wt loss, pleuritic or exertional cp, hemoptysis,  orthopnea pnd or leg swelling, presyncope, palpitations, heartburn, abdominal pain, anorexia, nausea, vomiting, diarrhea  or change in bowel or urinary habits, change in stools or urine, dysuria,hematuria,  rash, arthralgias, visual complaints, headache, numbness weakness generalized  or ataxia or problems with walking or coordination,  change in mood/affect or memory.                           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   [facility]  - MRI 11/23/2007 c/w L1-2 bulging disc indents the thecal sac with foraminal stenosis  HEALTH  MAINTENANCE..........................................................................................[facility]  - Td 10/2005  - Pneumovax 10/07 second shot  - CPX 08/20/2010  --colon 02/2005 -int/extern. hems (repeat q7y)  Complex med regimen  --Meds reviewed with pt education and computerized med calendar completed/adjusted. November 08, 2008 , August 21, 2009 updated 02/19/2011 , 10/23/2011 , 09/19/2013  Syncope...........................................................................................................[facility] Kitchen[facility]och[facility]  - Mobitz II AV block s/p Medtronic pacemaker placement 12/11/2008  [facility].....................................................................................................[facility]  - Pruritic rash 04/2009 > tried lotrisone, referred to [facility] August 06, 2009  Memory impairment , MMSE 27/30 8/25>refer to neuro   Family History:   mother had diabetes   Social History:  Patient states former smoker.  quit smoking in 1970  No ETOH  Retired            Objective:   Physical Exam wt 244 January 16, 2008 > 249 April 24, 2009 > 249 10/06/2010 >  11/27/2010  245 > 01/07/2011 241 > 02/19/11 238 > 06/04/2011  247 >07/03/2011 246 > 07/31/2011  248 > 09/11/2011  247 >246 10/23/2011 > 258 02/15/2012 > 05/19/2012  258 > 08/19/2012 255 >  266 09/05/12>  11/24/12 256>235 01/02/13 > 237  01/23/2013  237>>230 03/06/2013 >  06/14/13 234>234 09/19/2013 >  12/18/2013 231 >227 02/20/2014    amb obese wm in no acute distress / vital signs reviewed  HEENT: nl dentition and orophanx.  Nasal mucosa dry, Mucus membranes pink and moist. No tongue or throat exudate.  Neck: supple with full ROM. no JVD, node enlargement or TMJ   Lungs: clear and equal bilateral breath sounds. No wheezing, rhonchi or rales  Chest: RRR. No murmurs, rubs, or gallops.   Tr- peripheral edema.  Abd: obese but soft and no rebound or focal tenderness. Normal excursion in the supine position.  Ext warm and dry, no calf  tenderness, cyanosis or clubbing. mild/mod chronic venous insufficiency changes. Varicose veins present. Neuro: nl sensorium and gait.. Gets up from chair fine without hands, sym strength both legs  Skin:   No rash    CXR  12/18/2013 : 1. Cardiomegaly. Prior CABG. Cardiac pacer with lead tips in right atrium and right ventricle. 2. Slight hiatal hernia. 3. No acute pulmonary disease.   Recent Labs Lab 12/18/13 1013  NA 139  K 3.4*  CL 102  CO2 26  BUN 16  CREATININE 0.9  GLUCOSE 194*   Lab Results  Component Value Date   TSH 2.79 12/18/2013     Lab Results  Component Value Date   PROBNP 232.0* 12/18/2013         Lab Results  Component Value Date   HGBA1C 6.4 12/18/2013   HGBA1C 6.1 06/14/2013   HGBA1C 5.7 03/06/2013        Assessment:

## 2014-02-20 NOTE — Assessment & Plan Note (Signed)
Controlled on current regimen  Plan No changes

## 2014-02-20 NOTE — Assessment & Plan Note (Addendum)
Controlled on current regimen  Plan No Changes

## 2014-02-20 NOTE — Assessment & Plan Note (Signed)
Continue to follow with hematology

## 2014-02-20 NOTE — Patient Instructions (Signed)
We updated your med calendar , follow this closely and bring to each visit  Keep on low sweet and cholesterol diet.  Follow up Dr. [name]  In 3 months and As needed

## 2014-02-20 NOTE — Assessment & Plan Note (Signed)
Controlled on current regimen. Advised on a low salt diet

## 2014-02-22 ENCOUNTER — Other Ambulatory Visit: Payer: Self-pay | Admitting: Adult Health

## 2014-02-26 ENCOUNTER — Ambulatory Visit (HOSPITAL_COMMUNITY)
Admission: RE | Admit: 2014-02-26 | Discharge: 2014-02-26 | Disposition: A | Discharge status: Home / Self Care | Payer: Medicare Other | Source: Ambulatory Visit | Attending: Oncology | Admitting: Oncology

## 2014-02-26 DIAGNOSIS — D469 Myelodysplastic syndrome, unspecified: Secondary | ICD-10-CM | POA: Insufficient documentation

## 2014-02-26 DIAGNOSIS — Z7982 Long term (current) use of aspirin: Secondary | ICD-10-CM | POA: Insufficient documentation

## 2014-02-26 DIAGNOSIS — Z79899 Other long term (current) drug therapy: Secondary | ICD-10-CM | POA: Insufficient documentation

## 2014-02-26 DIAGNOSIS — C8290 Follicular lymphoma, unspecified, unspecified site: Secondary | ICD-10-CM | POA: Insufficient documentation

## 2014-02-26 DIAGNOSIS — D539 Nutritional anemia, unspecified: Secondary | ICD-10-CM

## 2014-03-02 ENCOUNTER — Telehealth: Payer: Self-pay | Admitting: *Deleted

## 2014-03-02 ENCOUNTER — Telehealth: Payer: Self-pay | Admitting: Oncology

## 2014-03-02 ENCOUNTER — Other Ambulatory Visit (HOSPITAL_BASED_OUTPATIENT_CLINIC_OR_DEPARTMENT_OTHER): Payer: Medicare Other

## 2014-03-02 ENCOUNTER — Ambulatory Visit (HOSPITAL_BASED_OUTPATIENT_CLINIC_OR_DEPARTMENT_OTHER): Payer: Medicare Other

## 2014-03-02 ENCOUNTER — Ambulatory Visit (HOSPITAL_BASED_OUTPATIENT_CLINIC_OR_DEPARTMENT_OTHER): Payer: Medicare Other | Admitting: Oncology

## 2014-03-02 VITALS — BP 121/48 | HR 102 | Temp 98.1°F | Resp 19 | Ht 71.0 in | Wt 235.6 lb

## 2014-03-02 DIAGNOSIS — D61818 Other pancytopenia: Secondary | ICD-10-CM

## 2014-03-02 DIAGNOSIS — Z8572 Personal history of non-Hodgkin lymphomas: Secondary | ICD-10-CM

## 2014-03-02 DIAGNOSIS — D469 Myelodysplastic syndrome, unspecified: Secondary | ICD-10-CM

## 2014-03-02 DIAGNOSIS — D472 Monoclonal gammopathy: Secondary | ICD-10-CM

## 2014-03-02 LAB — CBC WITH DIFFERENTIAL/PLATELET
BASO%: 0.3 % (ref 0.0–2.0)
BASOS ABS: 0 10*3/uL (ref 0.0–0.1)
EOS%: 1.1 % (ref 0.0–7.0)
Eosinophils Absolute: 0 10*3/uL (ref 0.0–0.5)
HEMATOCRIT: 27.3 % — AB (ref 38.4–49.9)
HGB: 9.2 g/dL — ABNORMAL LOW (ref 13.0–17.1)
LYMPH%: 63.6 % — AB (ref 14.0–49.0)
MCH: 36.1 pg — AB (ref 27.2–33.4)
MCHC: 33.8 g/dL (ref 32.0–36.0)
MCV: 106.6 fL — ABNORMAL HIGH (ref 79.3–98.0)
MONO#: 0 10*3/uL — ABNORMAL LOW (ref 0.1–0.9)
MONO%: 0.6 % (ref 0.0–14.0)
NEUT%: 34.4 % — ABNORMAL LOW (ref 39.0–75.0)
NEUTROS ABS: 0.5 10*3/uL — AB (ref 1.5–6.5)
Platelets: 95 10*3/uL — ABNORMAL LOW (ref 140–400)
RBC: 2.56 10*6/uL — ABNORMAL LOW (ref 4.20–5.82)
RDW: 24.5 % — ABNORMAL HIGH (ref 11.0–14.6)
WBC: 1.3 10*3/uL — AB (ref 4.0–10.3)
lymph#: 0.8 10*3/uL — ABNORMAL LOW (ref 0.9–3.3)

## 2014-03-02 LAB — HOLD TUBE, BLOOD BANK

## 2014-03-02 MED ORDER — DARBEPOETIN ALFA 300 MCG/0.6ML IJ SOSY
300.0000 ug | PREFILLED_SYRINGE | Freq: Once | INTRAMUSCULAR | Status: AC
  Administered 2014-03-02: 300 ug via SUBCUTANEOUS
  Filled 2014-03-02: qty 0.6

## 2014-03-02 NOTE — Progress Notes (Signed)
Hematology and Oncology Follow Up Visit  EDDER BELLANCA [id number] 1932/02/08 81 y.o. 03/02/2014 9:37 AM  CC: [name]. [name], MD, FCCP    Principle Diagnosis: A 81 year old gentleman with the following diagnoses:   1. Follicular lymphoma, grade 3, stage III, diagnosed 2007. Continued to be in remission. 2. Pancytopenia associated with mild neutropenia and macrocytosis possibly indicating early myelodysplasia. Bone marrow  was repeated on 06/22/2012 confirmed the evidence of myelodysplastic syndrome 3. Monoclonal gammopathy of undetermined significance.  Prior Therapy:  1. Status post CHOP and rituximab.  He had complete response to therapy concluded in 2007.  No further imaging has been indicated at this time. 2. Status post bone marrow biopsy in April 2011, did not really show any evidence of myelodysplasia or metastatic lymphoma at this time.  Current therapy: Aranesp 300 mcg every 2weeks to keep his hemoglobin close to 11. He also has been receiving intermittent PRBC transfusions if his hemoglobin below 9.  Interim History: Mr. Charlesworth presents today for a followup visit with his daughter. Since his last visit, he continues to be relatively well. His performance status is relatively stable but slightly limited. He does spend the majority of his day in his house does go out occasionally and drives short distances. He does report some exertional dyspnea but for the most part his breathing a been stable. He has not noticed any further decline in his energy her performance status. No further bleeding episodes noted.  He reports no chest pain. He does not report any dysphagia.  He does not report any lymphadenopathy. Denies night sweats. He reports no syncope or falls. He uses a cane to ambulate at this time. He did not report any headaches or change in his vision. Does not report any seizures or confusion. He does not report any palpitation or orthopnea. Has not reported any edema. Does not report  any wheezing but still have exertional dyspnea. Is not reporting any abdominal pain but his appetite had been down slightly,however his weight has remained relatively stable recently.  He does not report any lymphadenopathy or petechiae. Has not reported any skin rashes or genitourinary complaints. Remainder of review of systems unremarkable.   Medications: I have reviewed the patient's current medications Current Outpatient Prescriptions  Medication Sig Dispense Refill  . acetaminophen (TYLENOL) 500 MG tablet 1-2 every 4-6 hours as needed for headache    . albuterol (PROVENTIL HFA;VENTOLIN HFA) 108 (90 BASE) MCG/ACT inhaler Inhale 1-2 puffs into the lungs every 4 (four) hours as needed for wheezing or shortness of breath. 8 g 5  . aspirin 81 MG tablet Take 81 mg by mouth every morning. (hold if bleeding)    . calcium-vitamin D (OS-CAL 500 + D) 500-200 MG-UNIT per tablet Take 1 tablet by mouth 2 (two) times daily.     . cetirizine (ZYRTEC) 10 MG tablet Take 10 mg by mouth at bedtime as needed for allergies.    . Cholecalciferol (VITAMIN D) 1000 UNITS capsule Take 1,000 Units by mouth 2 (two) times daily.      [name] dextromethorphan-guaiFENesin (MUCINEX DM) 30-600 MG per 12 hr tablet Take 1-2 tablets by mouth every 12 (twelve) hours as needed for cough.     . docusate sodium (COLACE) 100 MG capsule Take 100 mg by mouth at bedtime.    . famotidine (PEPCID) 20 MG tablet Take 20 mg by mouth at bedtime.     . finasteride (PROSCAR) 5 MG tablet Take 5 mg by mouth every morning.     [name]  furosemide (LASIX) 40 MG tablet Take 40 mg by mouth daily. May add 1 extra daily as needed for swelling in legs    . hydrocortisone cream 1 % Apply 1 application topically 2 (two) times daily as needed (itchy rash).     . hydrOXYzine (ATARAX/VISTARIL) 25 MG tablet 1-2 every 4-6 hours as needed for itching    . KLOR-CON M20 20 MEQ tablet TAKE 1 TABLET BY MOUTH EVERY DAY 30 tablet 5  . levothyroxine (SYNTHROID, LEVOTHROID) 25 MCG  tablet Take 25 mcg by mouth daily.    . metFORMIN (GLUCOPHAGE) 500 MG tablet TAKE 1 TABLET BY MOUTH TWICE A DAY WITH FOOD 60 tablet 5  . methylcellulose (CITRUCEL) oral powder Take 1 packet by mouth at bedtime.    . mometasone (NASONEX) 50 MCG/ACT nasal spray Place 0-2 sprays into the nose at bedtime.     . mometasone-formoterol (DULERA) 200-5 MCG/ACT AERO Inhale 2 puffs into the lungs 2 (two) times daily.    . Multiple Vitamin (MULTIVITAMIN) tablet Take 1 tablet by mouth daily.      [name] omeprazole (PRILOSEC) 20 MG capsule Take 40 mg by mouth every morning.     [name] oxybutynin (DITROPAN-XL) 10 MG 24 hr tablet Take 10 mg by mouth daily.    . polyethylene glycol powder (MIRALAX) powder Take 17 g by mouth daily.     . sulindac (CLINORIL) 200 MG tablet Take 200 mg by mouth 2 (two) times daily as needed.     . traMADol (ULTRAM) 50 MG tablet Take 50 mg by mouth every 4 (four) hours as needed.    . traZODone (DESYREL) 150 MG tablet TAKE 1/3 TABLET BY MOUTH AT BEDTIME 30 tablet 4   No current facility-administered medications for this visit.     Allergies: No Known Allergies  Past Medical History, Surgical history, Social history, and Family History were reviewed and updated.    Physical Exam: Blood pressure 121/48, pulse 102, temperature 98.1 F (36.7 C), temperature source Oral, resp. rate 19, height 5' 11"  (1.803 m), weight 235 lb 9.6 oz (106.867 kg), SpO2 99 %. ECOG: 1 General appearance: alert, slightly pale not in any distress. Head: Normocephalic, without obvious abnormality Neck: no adenopathy Lymph nodes: Cervical, supraclavicular, and axillary nodes normal. Heart:regular rate and rhythm, S1, S2 normal, no murmur, click, rub or gallop Lung:chest clear, no wheezing, rales, normal symmetric air entry. No dullness to percussion. Abdomen: soft, non-tender, without masses or organomegaly. No shifting dullness or ascites. EXT:no erythema, induration, or nodules  Lab Results: Lab Results   Component Value Date   WBC 1.3* 03/02/2014   HGB 9.2* 03/02/2014   HCT 27.3* 03/02/2014   MCV 106.6* 03/02/2014   PLT 95* 03/02/2014     Chemistry      Component Value Date/Time   NA 139 12/18/2013 1013   NA 137 01/05/2013 1501   K 3.4* 12/18/2013 1013   K 3.8 01/05/2013 1501   CL 102 12/18/2013 1013   CL 99 06/08/2012 1257   CO2 26 12/18/2013 1013   CO2 26 01/05/2013 1501   BUN 16 12/18/2013 1013   BUN 13.9 01/05/2013 1501   CREATININE 0.9 12/18/2013 1013   CREATININE 0.8 01/05/2013 1501      Component Value Date/Time   CALCIUM 9.9 12/18/2013 1013   CALCIUM 10.1 01/05/2013 1501   ALKPHOS 82 12/06/2012 1850   ALKPHOS 48 08/10/2012 1117   AST 17 12/06/2012 1850   AST 17 08/10/2012 1117   ALT <5 12/06/2012  1850   ALT 14 08/10/2012 1117   BILITOT 0.7 12/06/2012 1850   BILITOT 0.82 08/10/2012 1117      Impression and Plan:  This is a 81 year old gentleman with the following issues:  1. Follicular lymphoma diagnosed in 2007. He has no evidence to suggest recurrent disease. 2. Pancytopenia due to myelodysplastic syndrome. He is status post bone marrow biopsy was done on 06/22/2012.  He is to continue on Aranesp 300 mcg every 2 weeks as well as intermittent transfusions. His hemoglobin is adequate today and does not require a transfusion but we will proceed with Aranesp injection. The plan is to keep him on every 2 weeks follow-up and transfusion for hemoglobin less than 9 and proceed with Aranesp for any hemoglobin below 11. 3. Monoclonal gammopathy.  His M-spike had been less than 1 g/dL, his bone marrow biopsy continued to show a less than 8% plasma cells indicating most likely multiple myeloma.   4. Followup every 2 weeks for an injection possible transfusion. He will have a clinical visit in 8 weeks.  [facility], MD  2/5/20169:37 AM

## 2014-03-02 NOTE — Telephone Encounter (Signed)
Pt confirmed labs/ov per 02/05 POF, gave pt AVS.... KJ, sent msg to add infusion and will add inj following infusion and will mail updated sch to pt with treatment added along with inj

## 2014-03-02 NOTE — Telephone Encounter (Signed)
Per staff message and POF I have scheduled appts. Advised scheduler of appts. JMW  

## 2014-03-02 NOTE — Addendum Note (Signed)
Addended by: Parke Poisson E on: 03/02/2014 01:45 PM   Modules accepted: Medications

## 2014-03-05 ENCOUNTER — Telehealth: Payer: Self-pay | Admitting: Oncology

## 2014-03-05 ENCOUNTER — Encounter: Payer: Self-pay | Admitting: Oncology

## 2014-03-05 NOTE — Telephone Encounter (Signed)
Lft msg for pt confirming D/T and mailed out schedule to pt.... KJ

## 2014-03-16 ENCOUNTER — Other Ambulatory Visit (HOSPITAL_BASED_OUTPATIENT_CLINIC_OR_DEPARTMENT_OTHER): Payer: Medicare Other

## 2014-03-16 ENCOUNTER — Other Ambulatory Visit: Payer: Self-pay | Admitting: *Deleted

## 2014-03-16 ENCOUNTER — Ambulatory Visit: Payer: Medicare Other

## 2014-03-16 ENCOUNTER — Other Ambulatory Visit: Payer: Self-pay

## 2014-03-16 ENCOUNTER — Ambulatory Visit (HOSPITAL_BASED_OUTPATIENT_CLINIC_OR_DEPARTMENT_OTHER): Payer: Medicare Other

## 2014-03-16 VITALS — BP 122/58 | HR 75 | Temp 98.1°F | Resp 18

## 2014-03-16 DIAGNOSIS — Z7982 Long term (current) use of aspirin: Secondary | ICD-10-CM | POA: Diagnosis not present

## 2014-03-16 DIAGNOSIS — D469 Myelodysplastic syndrome, unspecified: Secondary | ICD-10-CM

## 2014-03-16 DIAGNOSIS — D539 Nutritional anemia, unspecified: Secondary | ICD-10-CM

## 2014-03-16 DIAGNOSIS — D61818 Other pancytopenia: Secondary | ICD-10-CM

## 2014-03-16 DIAGNOSIS — Z79899 Other long term (current) drug therapy: Secondary | ICD-10-CM | POA: Diagnosis not present

## 2014-03-16 DIAGNOSIS — C8290 Follicular lymphoma, unspecified, unspecified site: Secondary | ICD-10-CM | POA: Diagnosis not present

## 2014-03-16 LAB — CBC WITH DIFFERENTIAL/PLATELET
BASO%: 0.9 % (ref 0.0–2.0)
Basophils Absolute: 0 10*3/uL (ref 0.0–0.1)
EOS%: 1.8 % (ref 0.0–7.0)
Eosinophils Absolute: 0 10*3/uL (ref 0.0–0.5)
HCT: 24 % — ABNORMAL LOW (ref 38.4–49.9)
HGB: 8.1 g/dL — ABNORMAL LOW (ref 13.0–17.1)
LYMPH#: 0.7 10*3/uL — AB (ref 0.9–3.3)
LYMPH%: 64 % — ABNORMAL HIGH (ref 14.0–49.0)
MCH: 36.7 pg — AB (ref 27.2–33.4)
MCHC: 33.8 g/dL (ref 32.0–36.0)
MCV: 108.6 fL — AB (ref 79.3–98.0)
MONO#: 0 10*3/uL — ABNORMAL LOW (ref 0.1–0.9)
MONO%: 0.9 % (ref 0.0–14.0)
NEUT#: 0.4 10*3/uL — CL (ref 1.5–6.5)
NEUT%: 32.4 % — ABNORMAL LOW (ref 39.0–75.0)
Platelets: 75 10*3/uL — ABNORMAL LOW (ref 140–400)
RBC: 2.21 10*6/uL — AB (ref 4.20–5.82)
RDW: 21.1 % — AB (ref 11.0–14.6)
WBC: 1.1 10*3/uL — ABNORMAL LOW (ref 4.0–10.3)

## 2014-03-16 LAB — PREPARE RBC (CROSSMATCH)

## 2014-03-16 LAB — HOLD TUBE, BLOOD BANK

## 2014-03-16 MED ORDER — SODIUM CHLORIDE 0.9 % IV SOLN
250.0000 mL | Freq: Once | INTRAVENOUS | Status: AC
  Administered 2014-03-16: 250 mL via INTRAVENOUS

## 2014-03-16 MED ORDER — DARBEPOETIN ALFA 300 MCG/0.6ML IJ SOSY
300.0000 ug | PREFILLED_SYRINGE | Freq: Once | INTRAMUSCULAR | Status: AC
  Administered 2014-03-16: 300 ug via SUBCUTANEOUS
  Filled 2014-03-16: qty 0.6

## 2014-03-16 MED ORDER — ACETAMINOPHEN 325 MG PO TABS
650.0000 mg | ORAL_TABLET | Freq: Once | ORAL | Status: AC
  Administered 2014-03-16: 650 mg via ORAL

## 2014-03-16 MED ORDER — DIPHENHYDRAMINE HCL 25 MG PO CAPS
ORAL_CAPSULE | ORAL | Status: AC
  Filled 2014-03-16: qty 1

## 2014-03-16 MED ORDER — ACETAMINOPHEN 325 MG PO TABS
ORAL_TABLET | ORAL | Status: AC
  Filled 2014-03-16: qty 2

## 2014-03-16 MED ORDER — DIPHENHYDRAMINE HCL 25 MG PO CAPS
25.0000 mg | ORAL_CAPSULE | Freq: Once | ORAL | Status: AC
  Administered 2014-03-16: 25 mg via ORAL

## 2014-03-16 NOTE — Patient Instructions (Signed)

## 2014-03-18 LAB — TYPE AND SCREEN
ABO/RH(D): A POS
ANTIBODY SCREEN: NEGATIVE
Unit division: 0
Unit division: 0

## 2014-03-19 ENCOUNTER — Ambulatory Visit: Payer: Medicare Other | Admitting: Adult Health

## 2014-03-19 ENCOUNTER — Telehealth: Payer: Self-pay | Admitting: Internal Medicine

## 2014-03-19 NOTE — Telephone Encounter (Signed)
Per EC pt was experiencing some weakness, fatigue, and mild CP over the weekend following a blood transfusion so she wanted to r/o issues from ppm. She states that pt feels well today. I informed her that the patient has not had any episodes since 2-5. I encouraged her to contact the patient's PCP about the the weakness and fatigue, but to go to the hosp if CP noticed again. EC voiced understanding.

## 2014-03-19 NOTE — Telephone Encounter (Signed)
New Message  Pt had an episode over the weekend requests a call back to discuss if there were any reading received. Please call

## 2014-03-23 ENCOUNTER — Telehealth: Payer: Self-pay | Admitting: Adult Health

## 2014-03-23 NOTE — Telephone Encounter (Signed)
Received notice from [facility] that pt's Proair is no longer on pt's formulary Per the 1.26.16 ov w/ TP this was already changed Notice sent to be scanned into patient's chart Nothing needed; will sign off.

## 2014-03-27 ENCOUNTER — Ambulatory Visit (HOSPITAL_COMMUNITY)
Admission: RE | Admit: 2014-03-27 | Discharge: 2014-03-27 | Disposition: A | Discharge status: Home / Self Care | Payer: Medicare Other | Source: Ambulatory Visit | Attending: Oncology | Admitting: Oncology

## 2014-03-27 DIAGNOSIS — D508 Other iron deficiency anemias: Secondary | ICD-10-CM | POA: Insufficient documentation

## 2014-03-30 ENCOUNTER — Other Ambulatory Visit: Payer: Self-pay | Admitting: *Deleted

## 2014-03-30 ENCOUNTER — Ambulatory Visit: Payer: Medicare Other

## 2014-03-30 ENCOUNTER — Other Ambulatory Visit (HOSPITAL_BASED_OUTPATIENT_CLINIC_OR_DEPARTMENT_OTHER): Payer: Medicare Other

## 2014-03-30 ENCOUNTER — Ambulatory Visit (HOSPITAL_BASED_OUTPATIENT_CLINIC_OR_DEPARTMENT_OTHER): Payer: Medicare Other

## 2014-03-30 VITALS — BP 123/59 | HR 73 | Temp 97.9°F | Resp 20

## 2014-03-30 DIAGNOSIS — D508 Other iron deficiency anemias: Secondary | ICD-10-CM

## 2014-03-30 DIAGNOSIS — C8590 Non-Hodgkin lymphoma, unspecified, unspecified site: Secondary | ICD-10-CM

## 2014-03-30 DIAGNOSIS — D61818 Other pancytopenia: Secondary | ICD-10-CM | POA: Diagnosis not present

## 2014-03-30 DIAGNOSIS — D469 Myelodysplastic syndrome, unspecified: Secondary | ICD-10-CM

## 2014-03-30 DIAGNOSIS — D649 Anemia, unspecified: Secondary | ICD-10-CM

## 2014-03-30 DIAGNOSIS — D539 Nutritional anemia, unspecified: Secondary | ICD-10-CM

## 2014-03-30 LAB — CBC WITH DIFFERENTIAL/PLATELET
BASO%: 0 % (ref 0.0–2.0)
Basophils Absolute: 0 10*3/uL (ref 0.0–0.1)
EOS ABS: 0 10*3/uL (ref 0.0–0.5)
EOS%: 0.8 % (ref 0.0–7.0)
HCT: 26.2 % — ABNORMAL LOW (ref 38.4–49.9)
HEMOGLOBIN: 8.8 g/dL — AB (ref 13.0–17.1)
LYMPH#: 0.7 10*3/uL — AB (ref 0.9–3.3)
LYMPH%: 55.5 % — ABNORMAL HIGH (ref 14.0–49.0)
MCH: 35.9 pg — ABNORMAL HIGH (ref 27.2–33.4)
MCHC: 33.6 g/dL (ref 32.0–36.0)
MCV: 106.9 fL — ABNORMAL HIGH (ref 79.3–98.0)
MONO#: 0 10*3/uL — AB (ref 0.1–0.9)
MONO%: 0.8 % (ref 0.0–14.0)
NEUT%: 42.9 % (ref 39.0–75.0)
NEUTROS ABS: 0.5 10*3/uL — AB (ref 1.5–6.5)
PLATELETS: 82 10*3/uL — AB (ref 140–400)
RBC: 2.45 10*6/uL — ABNORMAL LOW (ref 4.20–5.82)
RDW: 21.3 % — ABNORMAL HIGH (ref 11.0–14.6)
WBC: 1.2 10*3/uL — ABNORMAL LOW (ref 4.0–10.3)

## 2014-03-30 LAB — PREPARE RBC (CROSSMATCH)

## 2014-03-30 LAB — HOLD TUBE, BLOOD BANK

## 2014-03-30 MED ORDER — ACETAMINOPHEN 325 MG PO TABS
650.0000 mg | ORAL_TABLET | Freq: Once | ORAL | Status: AC
  Administered 2014-03-30: 650 mg via ORAL

## 2014-03-30 MED ORDER — DIPHENHYDRAMINE HCL 25 MG PO CAPS
25.0000 mg | ORAL_CAPSULE | Freq: Once | ORAL | Status: AC
  Administered 2014-03-30: 25 mg via ORAL

## 2014-03-30 MED ORDER — DARBEPOETIN ALFA 300 MCG/0.6ML IJ SOSY
300.0000 ug | PREFILLED_SYRINGE | Freq: Once | INTRAMUSCULAR | Status: AC
  Administered 2014-03-30: 300 ug via SUBCUTANEOUS
  Filled 2014-03-30: qty 0.6

## 2014-03-30 MED ORDER — ACETAMINOPHEN 325 MG PO TABS
ORAL_TABLET | ORAL | Status: AC
  Filled 2014-03-30: qty 2

## 2014-03-30 MED ORDER — SODIUM CHLORIDE 0.9 % IV SOLN
250.0000 mL | Freq: Once | INTRAVENOUS | Status: AC
  Administered 2014-03-30: 250 mL via INTRAVENOUS

## 2014-03-30 MED ORDER — DIPHENHYDRAMINE HCL 25 MG PO CAPS
ORAL_CAPSULE | ORAL | Status: AC
  Filled 2014-03-30: qty 1

## 2014-03-30 NOTE — Patient Instructions (Signed)
Blood Transfusion Information WHAT IS A BLOOD TRANSFUSION? A transfusion is the replacement of blood or some of its parts. Blood is made up of multiple cells which provide different functions.  Red blood cells carry oxygen and are used for blood loss replacement.  White blood cells fight against infection.  Platelets control bleeding.  Plasma helps clot blood.  Other blood products are available for specialized needs, such as hemophilia or other clotting disorders. BEFORE THE TRANSFUSION  Who gives blood for transfusions?   You may be able to donate blood to be used at a later date on yourself (autologous donation).  Relatives can be asked to donate blood. This is generally not any safer than if you have received blood from a stranger. The same precautions are taken to ensure safety when a relative's blood is donated.  Healthy volunteers who are fully evaluated to make sure their blood is safe. This is blood bank blood. Transfusion therapy is the safest it has ever been in the practice of medicine. Before blood is taken from a donor, a complete history is taken to make sure that person has no history of diseases nor engages in risky social behavior (examples are intravenous drug use or sexual activity with multiple partners). The donor's travel history is screened to minimize risk of transmitting infections, such as malaria. The donated blood is tested for signs of infectious diseases, such as HIV and hepatitis. The blood is then tested to be sure it is compatible with you in order to minimize the chance of a transfusion reaction. If you or a relative donates blood, this is often done in anticipation of surgery and is not appropriate for emergency situations. It takes many days to process the donated blood. RISKS AND COMPLICATIONS Although transfusion therapy is very safe and saves many lives, the main dangers of transfusion include:   Getting an infectious disease.  Developing a  transfusion reaction. This is an allergic reaction to something in the blood you were given. Every precaution is taken to prevent this. The decision to have a blood transfusion has been considered carefully by your caregiver before blood is given. Blood is not given unless the benefits outweigh the risks. AFTER THE TRANSFUSION  Right after receiving a blood transfusion, you will usually feel much better and more energetic. This is especially true if your red blood cells have gotten low (anemic). The transfusion raises the level of the red blood cells which carry oxygen, and this usually causes an energy increase.  The nurse administering the transfusion will monitor you carefully for complications. HOME CARE INSTRUCTIONS  No special instructions are needed after a transfusion. You may find your energy is better. Speak with your caregiver about any limitations on activity for underlying diseases you may have. SEEK MEDICAL CARE IF:   Your condition is not improving after your transfusion.  You develop redness or irritation at the intravenous (IV) site. SEEK IMMEDIATE MEDICAL CARE IF:  Any of the following symptoms occur over the next 12 hours:  Shaking chills.  You have a temperature by mouth above 102 F (38.9 C), not controlled by medicine.  Chest, back, or muscle pain.  People around you feel you are not acting correctly or are confused.  Shortness of breath or difficulty breathing.  Dizziness and fainting.  You get a rash or develop hives.  You have a decrease in urine output.  Your urine turns a dark color or changes to pink, red, or brown. Any of the following   symptoms occur over the next 10 days:  You have a temperature by mouth above 102 F (38.9 C), not controlled by medicine.  Shortness of breath.  Weakness after normal activity.  The white part of the eye turns yellow (jaundice).  You have a decrease in the amount of urine or are urinating less often.  Your  urine turns a dark color or changes to pink, red, or brown. Document Released: 01/10/2000 Document Revised: 04/06/2011 Document Reviewed: 08/29/2007 [facility] Patient Information 2015 [place], [place]. This information is not intended to replace advice given to you by your health care provider. Make sure you discuss any questions you have with your health care provider.  Darbepoetin Alfa injection What is this medicine? DARBEPOETIN ALFA (dar be POE e tin AL fa) helps your body make more red blood cells. It is used to treat anemia caused by chronic kidney failure and chemotherapy. This medicine may be used for other purposes; ask your health care provider or pharmacist if you have questions. COMMON BRAND NAME(S): Aranesp What should I tell my health care provider before I take this medicine? They need to know if you have any of these conditions: -blood clotting disorders or history of blood clots -cancer patient not on chemotherapy -cystic fibrosis -heart disease, such as angina, heart failure, or a history of a heart attack -hemoglobin level of 12 g/dL or greater -high blood pressure -low levels of folate, iron, or vitamin B12 -seizures -an unusual or allergic reaction to darbepoetin, erythropoietin, albumin, hamster proteins, latex, other medicines, foods, dyes, or preservatives -pregnant or trying to get pregnant -breast-feeding How should I use this medicine? This medicine is for injection into a vein or under the skin. It is usually given by a health care professional in a hospital or clinic setting. If you get this medicine at home, you will be taught how to prepare and give this medicine. Do not shake the solution before you withdraw a dose. Use exactly as directed. Take your medicine at regular intervals. Do not take your medicine more often than directed. It is important that you put your used needles and syringes in a special sharps container. Do not put them in a trash can. If you do  not have a sharps container, call your pharmacist or healthcare provider to get one. Talk to your pediatrician regarding the use of this medicine in children. While this medicine may be used in children as young as 1 year for selected conditions, precautions do apply. Overdosage: If you think you have taken too much of this medicine contact a poison control center or emergency room at once. NOTE: This medicine is only for you. Do not share this medicine with others. What if I miss a dose? If you miss a dose, take it as soon as you can. If it is almost time for your next dose, take only that dose. Do not take double or extra doses. What may interact with this medicine? Do not take this medicine with any of the following medications: -epoetin alfa This list may not describe all possible interactions. Give your health care provider a list of all the medicines, herbs, non-prescription drugs, or dietary supplements you use. Also tell them if you smoke, drink alcohol, or use illegal drugs. Some items may interact with your medicine. What should I watch for while using this medicine? Visit your prescriber or health care professional for regular checks on your progress and for the needed blood tests and blood pressure measurements. It is  especially important for the doctor to make sure your hemoglobin level is in the desired range, to limit the risk of potential side effects and to give you the best benefit. Keep all appointments for any recommended tests. Check your blood pressure as directed. Ask your doctor what your blood pressure should be and when you should contact him or her. As your body makes more red blood cells, you may need to take iron, folic acid, or vitamin B supplements. Ask your doctor or health care provider which products are right for you. If you have kidney disease continue dietary restrictions, even though this medication can make you feel better. Talk with your doctor or health care  professional about the foods you eat and the vitamins that you take. What side effects may I notice from receiving this medicine? Side effects that you should report to your doctor or health care professional as soon as possible: -allergic reactions like skin rash, itching or hives, swelling of the face, lips, or tongue -breathing problems -changes in vision -chest pain -confusion, trouble speaking or understanding -feeling faint or lightheaded, falls -high blood pressure -muscle aches or pains -pain, swelling, warmth in the leg -rapid weight gain -severe headaches -sudden numbness or weakness of the face, arm or leg -trouble walking, dizziness, loss of balance or coordination -seizures (convulsions) -swelling of the ankles, feet, hands -unusually weak or tired Side effects that usually do not require medical attention (report to your doctor or health care professional if they continue or are bothersome): -diarrhea -fever, chills (flu-like symptoms) -headaches -nausea, vomiting -redness, stinging, or swelling at site where injected This list may not describe all possible side effects. Call your doctor for medical advice about side effects. You may report side effects to FDA at 1-800-FDA-1088. Where should I keep my medicine? Keep out of the reach of children. Store in a refrigerator between 2 and 8 degrees C (36 and 46 degrees F). Do not freeze. Do not shake. Throw away any unused portion if using a single-dose vial. Throw away any unused medicine after the expiration date. NOTE: This sheet is a summary. It may not cover all possible information. If you have questions about this medicine, talk to your doctor, pharmacist, or health care provider.  2015, [employer]. (2007-12-27 10:23:57)

## 2014-04-01 LAB — TYPE AND SCREEN
ABO/RH(D): A POS
Antibody Screen: NEGATIVE
Unit division: 0

## 2014-04-12 ENCOUNTER — Other Ambulatory Visit: Payer: Self-pay | Admitting: *Deleted

## 2014-04-13 ENCOUNTER — Other Ambulatory Visit: Payer: Self-pay | Admitting: *Deleted

## 2014-04-13 ENCOUNTER — Ambulatory Visit (HOSPITAL_BASED_OUTPATIENT_CLINIC_OR_DEPARTMENT_OTHER): Payer: Medicare Other

## 2014-04-13 ENCOUNTER — Ambulatory Visit: Payer: Medicare Other

## 2014-04-13 ENCOUNTER — Other Ambulatory Visit (HOSPITAL_BASED_OUTPATIENT_CLINIC_OR_DEPARTMENT_OTHER): Payer: Medicare Other

## 2014-04-13 VITALS — BP 131/69 | HR 68 | Temp 97.7°F | Resp 18

## 2014-04-13 DIAGNOSIS — D509 Iron deficiency anemia, unspecified: Secondary | ICD-10-CM

## 2014-04-13 DIAGNOSIS — D61818 Other pancytopenia: Secondary | ICD-10-CM | POA: Diagnosis not present

## 2014-04-13 DIAGNOSIS — D469 Myelodysplastic syndrome, unspecified: Secondary | ICD-10-CM

## 2014-04-13 DIAGNOSIS — D508 Other iron deficiency anemias: Secondary | ICD-10-CM | POA: Diagnosis not present

## 2014-04-13 LAB — PREPARE RBC (CROSSMATCH)

## 2014-04-13 LAB — HOLD TUBE, BLOOD BANK

## 2014-04-13 LAB — CBC WITH DIFFERENTIAL/PLATELET
BASO%: 0 % (ref 0.0–2.0)
Basophils Absolute: 0 10*3/uL (ref 0.0–0.1)
EOS%: 0.8 % (ref 0.0–7.0)
Eosinophils Absolute: 0 10*3/uL (ref 0.0–0.5)
HEMATOCRIT: 26.9 % — AB (ref 38.4–49.9)
HEMOGLOBIN: 9.3 g/dL — AB (ref 13.0–17.1)
LYMPH#: 0.8 10*3/uL — AB (ref 0.9–3.3)
LYMPH%: 60 % — ABNORMAL HIGH (ref 14.0–49.0)
MCH: 36.3 pg — ABNORMAL HIGH (ref 27.2–33.4)
MCHC: 34.6 g/dL (ref 32.0–36.0)
MCV: 105.1 fL — ABNORMAL HIGH (ref 79.3–98.0)
MONO#: 0 10*3/uL — AB (ref 0.1–0.9)
MONO%: 1.5 % (ref 0.0–14.0)
NEUT%: 37.7 % — ABNORMAL LOW (ref 39.0–75.0)
NEUTROS ABS: 0.5 10*3/uL — AB (ref 1.5–6.5)
PLATELETS: 75 10*3/uL — AB (ref 140–400)
RBC: 2.56 10*6/uL — ABNORMAL LOW (ref 4.20–5.82)
RDW: 21.6 % — ABNORMAL HIGH (ref 11.0–14.6)
WBC: 1.3 10*3/uL — ABNORMAL LOW (ref 4.0–10.3)

## 2014-04-13 MED ORDER — SODIUM CHLORIDE 0.9 % IV SOLN
250.0000 mL | Freq: Once | INTRAVENOUS | Status: AC
  Administered 2014-04-13: 250 mL via INTRAVENOUS

## 2014-04-13 MED ORDER — ACETAMINOPHEN 325 MG PO TABS
ORAL_TABLET | ORAL | Status: AC
  Filled 2014-04-13: qty 2

## 2014-04-13 MED ORDER — DIPHENHYDRAMINE HCL 25 MG PO CAPS
ORAL_CAPSULE | ORAL | Status: AC
  Filled 2014-04-13: qty 1

## 2014-04-13 MED ORDER — ACETAMINOPHEN 325 MG PO TABS
650.0000 mg | ORAL_TABLET | Freq: Once | ORAL | Status: AC
  Administered 2014-04-13: 650 mg via ORAL

## 2014-04-13 MED ORDER — DIPHENHYDRAMINE HCL 25 MG PO CAPS
25.0000 mg | ORAL_CAPSULE | Freq: Once | ORAL | Status: AC
  Administered 2014-04-13: 25 mg via ORAL

## 2014-04-13 MED ORDER — DARBEPOETIN ALFA 300 MCG/0.6ML IJ SOSY
300.0000 ug | PREFILLED_SYRINGE | Freq: Once | INTRAMUSCULAR | Status: AC
Start: 2014-04-13 — End: 2014-04-13
  Administered 2014-04-13: 300 ug via SUBCUTANEOUS
  Filled 2014-04-13: qty 0.6

## 2014-04-13 NOTE — Patient Instructions (Signed)

## 2014-04-14 LAB — TYPE AND SCREEN
ABO/RH(D): A POS
Antibody Screen: NEGATIVE
Unit division: 0

## 2014-04-27 ENCOUNTER — Other Ambulatory Visit (HOSPITAL_BASED_OUTPATIENT_CLINIC_OR_DEPARTMENT_OTHER): Payer: Medicare Other

## 2014-04-27 ENCOUNTER — Telehealth: Payer: Self-pay | Admitting: Oncology

## 2014-04-27 ENCOUNTER — Ambulatory Visit (HOSPITAL_BASED_OUTPATIENT_CLINIC_OR_DEPARTMENT_OTHER): Payer: Medicare Other | Admitting: Oncology

## 2014-04-27 ENCOUNTER — Ambulatory Visit (HOSPITAL_BASED_OUTPATIENT_CLINIC_OR_DEPARTMENT_OTHER): Payer: Medicare Other

## 2014-04-27 ENCOUNTER — Telehealth: Payer: Self-pay | Admitting: *Deleted

## 2014-04-27 ENCOUNTER — Ambulatory Visit (HOSPITAL_COMMUNITY)
Admission: RE | Admit: 2014-04-27 | Discharge: 2014-04-27 | Disposition: A | Discharge status: Home / Self Care | Payer: Medicare Other | Source: Ambulatory Visit | Attending: Oncology | Admitting: Oncology

## 2014-04-27 VITALS — BP 130/80 | HR 96 | Temp 97.7°F | Resp 18 | Ht 71.0 in | Wt 237.8 lb

## 2014-04-27 DIAGNOSIS — D472 Monoclonal gammopathy: Secondary | ICD-10-CM | POA: Diagnosis not present

## 2014-04-27 DIAGNOSIS — D61818 Other pancytopenia: Secondary | ICD-10-CM

## 2014-04-27 DIAGNOSIS — Z8579 Personal history of other malignant neoplasms of lymphoid, hematopoietic and related tissues: Secondary | ICD-10-CM

## 2014-04-27 DIAGNOSIS — D508 Other iron deficiency anemias: Secondary | ICD-10-CM | POA: Insufficient documentation

## 2014-04-27 DIAGNOSIS — D630 Anemia in neoplastic disease: Secondary | ICD-10-CM

## 2014-04-27 DIAGNOSIS — D469 Myelodysplastic syndrome, unspecified: Secondary | ICD-10-CM

## 2014-04-27 DIAGNOSIS — D709 Neutropenia, unspecified: Secondary | ICD-10-CM | POA: Diagnosis not present

## 2014-04-27 DIAGNOSIS — D500 Iron deficiency anemia secondary to blood loss (chronic): Secondary | ICD-10-CM

## 2014-04-27 LAB — CBC WITH DIFFERENTIAL/PLATELET
BASO%: 0 % (ref 0.0–2.0)
BASOS ABS: 0 10*3/uL (ref 0.0–0.1)
EOS ABS: 0 10*3/uL (ref 0.0–0.5)
EOS%: 0.8 % (ref 0.0–7.0)
HCT: 28.2 % — ABNORMAL LOW (ref 38.4–49.9)
HEMOGLOBIN: 9.8 g/dL — AB (ref 13.0–17.1)
LYMPH%: 68.7 % — ABNORMAL HIGH (ref 14.0–49.0)
MCH: 36.2 pg — ABNORMAL HIGH (ref 27.2–33.4)
MCHC: 34.8 g/dL (ref 32.0–36.0)
MCV: 104.1 fL — AB (ref 79.3–98.0)
MONO#: 0 10*3/uL — ABNORMAL LOW (ref 0.1–0.9)
MONO%: 0.8 % (ref 0.0–14.0)
NEUT#: 0.4 10*3/uL — CL (ref 1.5–6.5)
NEUT%: 29.7 % — AB (ref 39.0–75.0)
PLATELETS: 65 10*3/uL — AB (ref 140–400)
RBC: 2.71 10*6/uL — ABNORMAL LOW (ref 4.20–5.82)
RDW: 22.2 % — AB (ref 11.0–14.6)
WBC: 1.3 10*3/uL — ABNORMAL LOW (ref 4.0–10.3)
lymph#: 0.9 10*3/uL (ref 0.9–3.3)

## 2014-04-27 LAB — HOLD TUBE, BLOOD BANK

## 2014-04-27 MED ORDER — DARBEPOETIN ALFA 300 MCG/0.6ML IJ SOSY
300.0000 ug | PREFILLED_SYRINGE | Freq: Once | INTRAMUSCULAR | Status: AC
  Administered 2014-04-27: 300 ug via SUBCUTANEOUS
  Filled 2014-04-27: qty 0.6

## 2014-04-27 NOTE — Telephone Encounter (Signed)
Gave avs & calendar for April/May/June. Sent message to schedule blood

## 2014-04-27 NOTE — Progress Notes (Signed)
Hematology and Oncology Follow Up Visit  [name] [id number] 1932/07/22 81 y.o. 04/27/2014 8:42 AM  CC: [name]. [name], MD, FCCP    Principle Diagnosis: A 81 year old gentleman with the following diagnoses:   1. Follicular lymphoma, grade 3, stage III, diagnosed 2007. Continued to be in remission. 2. Pancytopenia associated with mild neutropenia and macrocytosis possibly indicating early myelodysplasia. Bone marrow  was repeated on 06/22/2012 confirmed the evidence of myelodysplastic syndrome 3. Monoclonal gammopathy of undetermined significance.  Prior Therapy:  1. Status post CHOP and rituximab.  He had complete response to therapy concluded in 2007.  No further imaging has been indicated at this time. 2. Status post bone marrow biopsy in April 2011, did not really show any evidence of myelodysplasia or metastatic lymphoma at this time.  Current therapy: Aranesp 300 mcg every 2weeks to keep his hemoglobin close to 11. He also has been receiving intermittent PRBC transfusions if his hemoglobin below 9.  Interim History: Mr. Hovis presents today for a followup visit with his daughter. Since his last visit, he decreased to be clinically stable. He did have 2 falls in the last 2 weeks predominantly due to slipping. He does not report any syncope or seizures. He does not report any injuries related to these falls. He is not reporting any chest pain or dyspnea on exertion today.  He does spend the majority of his day in his house does go out occasionally and drives short distances. He has not reported any recurrent sinopulmonary infections. Did not report any cellulitis.  He has not noticed any further decline in his energy her performance status. No further bleeding episodes noted.  He reports no chest pain. He does not report any dysphagia.  He does not report any lymphadenopathy. Denies night sweats. He reports no syncope or falls. He uses a cane to ambulate at this time. He did not report  any headaches or change in his vision. Does not report any seizures or confusion. He does not report any palpitation or orthopnea. Has not reported any edema. Does not report any wheezing but still have exertional dyspnea. Is not reporting any abdominal pain but his appetite had been down slightly,however his weight has remained relatively stable recently.  He does not report any lymphadenopathy or petechiae. Has not reported any skin rashes or genitourinary complaints. Remainder of review of systems unremarkable.   Medications: I have reviewed the patient's current medications Current Outpatient Prescriptions  Medication Sig Dispense Refill  . acetaminophen (TYLENOL) 500 MG tablet 1-2 every 4-6 hours as needed for headache    . albuterol (PROVENTIL HFA;VENTOLIN HFA) 108 (90 BASE) MCG/ACT inhaler Inhale 1-2 puffs into the lungs every 4 (four) hours as needed for wheezing or shortness of breath. 8 g 5  . aspirin 81 MG tablet Take 81 mg by mouth every morning. (hold if bleeding)    . calcium-vitamin D (OS-CAL 500 + D) 500-200 MG-UNIT per tablet Take 1 tablet by mouth 2 (two) times daily.     . cetirizine (ZYRTEC) 10 MG tablet Take 10 mg by mouth at bedtime as needed for allergies.    . Cholecalciferol (VITAMIN D) 1000 UNITS capsule Take 1,000 Units by mouth 2 (two) times daily.      [name] dextromethorphan-guaiFENesin (MUCINEX DM) 30-600 MG per 12 hr tablet Take 1-2 tablets by mouth every 12 (twelve) hours as needed for cough.     . docusate sodium (COLACE) 100 MG capsule Take 100 mg by mouth at bedtime.    [name]  famotidine (PEPCID) 20 MG tablet Take 20 mg by mouth at bedtime.     . finasteride (PROSCAR) 5 MG tablet Take 5 mg by mouth every morning.     . furosemide (LASIX) 40 MG tablet Take 40 mg by mouth daily. May add 1 extra daily as needed for swelling in legs    . hydrocortisone cream 1 % Apply 1 application topically 2 (two) times daily as needed (itchy rash).     . hydrOXYzine (ATARAX/VISTARIL) 25 MG  tablet 1-2 every 4-6 hours as needed for itching    . KLOR-CON M20 20 MEQ tablet TAKE 1 TABLET BY MOUTH EVERY DAY 30 tablet 5  . levothyroxine (SYNTHROID, LEVOTHROID) 25 MCG tablet Take 25 mcg by mouth daily.    . metFORMIN (GLUCOPHAGE) 500 MG tablet TAKE 1 TABLET BY MOUTH TWICE A DAY WITH FOOD 60 tablet 5  . methylcellulose (CITRUCEL) oral powder Take 1 packet by mouth at bedtime.    . mometasone (NASONEX) 50 MCG/ACT nasal spray Place 0-2 sprays into the nose at bedtime.     . mometasone-formoterol (DULERA) 200-5 MCG/ACT AERO Inhale 2 puffs into the lungs 2 (two) times daily.    . Multiple Vitamin (MULTIVITAMIN) tablet Take 1 tablet by mouth daily.      [name] omeprazole (PRILOSEC) 20 MG capsule Take 40 mg by mouth every morning.     [name] oxybutynin (DITROPAN-XL) 10 MG 24 hr tablet Take 10 mg by mouth daily.    . polyethylene glycol powder (MIRALAX) powder Take 17 g by mouth daily.     . sulindac (CLINORIL) 200 MG tablet Take 200 mg by mouth 2 (two) times daily as needed.     . traMADol (ULTRAM) 50 MG tablet 1/2 tab by mouth every 4 hours as needed for severe pain or cough    . traZODone (DESYREL) 150 MG tablet TAKE 1/3 TABLET BY MOUTH AT BEDTIME 30 tablet 4   No current facility-administered medications for this visit.     Allergies: No Known Allergies  Past Medical History, Surgical history, Social history, and Family History were reviewed and updated.    Physical Exam: Blood pressure 130/80, pulse 96, temperature 97.7 F (36.5 C), temperature source Oral, resp. rate 18, height _0  (1.803 m), weight 237 lb 12.8 oz (107.865 kg), SpO2 97 %. ECOG: 1 General appearance: alert, slightly pale not in any distress. Head: Normocephalic, without obvious abnormality Neck: no adenopathy Lymph nodes: Cervical, supraclavicular, and axillary nodes normal. Heart:regular rate and rhythm, S1, S2 normal, no murmur, click, rub or gallop Lung:chest clear, no wheezing, rales, normal symmetric air entry. No  dullness to percussion. Abdomen: soft, non-tender, without masses or organomegaly. No shifting dullness or ascites. EXT:no erythema, induration, or nodules  skin: No rashes or lesions noted.    Lab Results: Lab Results  Component Value Date   WBC 1.3* 04/27/2014   HGB 9.8* 04/27/2014   HCT 28.2* 04/27/2014   MCV 104.1* 04/27/2014   PLT 65* 04/27/2014     Chemistry      Component Value Date/Time   NA 139 12/18/2013 1013   NA 137 01/05/2013 1501   K 3.4* 12/18/2013 1013   K 3.8 01/05/2013 1501   CL 102 12/18/2013 1013   CL 99 06/08/2012 1257   CO2 26 12/18/2013 1013   CO2 26 01/05/2013 1501   BUN 16 12/18/2013 1013   BUN 13.9 01/05/2013 1501   CREATININE 0.9 12/18/2013 1013   CREATININE 0.8 01/05/2013 1501  Component Value Date/Time   CALCIUM 9.9 12/18/2013 1013   CALCIUM 10.1 01/05/2013 1501   ALKPHOS 82 12/06/2012 1850   ALKPHOS 48 08/10/2012 1117   AST 17 12/06/2012 1850   AST 17 08/10/2012 1117   ALT <5 12/06/2012 1850   ALT 14 08/10/2012 1117   BILITOT 0.7 12/06/2012 1850   BILITOT 0.82 08/10/2012 1117      Impression and Plan:  This is a 81 year old gentleman with the following issues:  1. Follicular lymphoma diagnosed in 2007. He has no evidence to suggest recurrent disease. 2. Pancytopenia due to myelodysplastic syndrome. He is status post bone marrow biopsy was done on 06/22/2012.  He is to continue on Aranesp 300 mcg every 2 weeks as well as intermittent transfusions. His hemoglobin is adequate today and does not require a transfusion but we will proceed with Aranesp injection. The plan is to keep him on every 2 weeks follow-up and transfusion for hemoglobin less than 9 and proceed with Aranesp for any hemoglobin below 11. his performance status and activity level remained stable without any further decline.  3. Neutropenia: Neutropenic precautions were given to the patient today. See no need for any growth factor support since he has not had any clinical  infections.  4. Monoclonal gammopathy.  His M-spike had been less than 1 g/dL, his bone marrow biopsy continued to show a less than 8% plasma cells indicating most likely multiple myeloma.   5. Followup every 2 weeks for an injection possible transfusion. He will have a clinical visit in 8 weeks.  [facility], MD  4/1/20168:42 AM

## 2014-04-27 NOTE — Telephone Encounter (Signed)
Per staff message and POF I have scheduled appts. Advised scheduler of appts. JMW  

## 2014-05-10 ENCOUNTER — Other Ambulatory Visit: Payer: Self-pay | Admitting: Internal Medicine

## 2014-05-11 ENCOUNTER — Other Ambulatory Visit: Payer: Self-pay | Admitting: *Deleted

## 2014-05-11 ENCOUNTER — Other Ambulatory Visit (HOSPITAL_BASED_OUTPATIENT_CLINIC_OR_DEPARTMENT_OTHER): Payer: Medicare Other

## 2014-05-11 ENCOUNTER — Ambulatory Visit: Payer: Medicare Other

## 2014-05-11 ENCOUNTER — Other Ambulatory Visit: Payer: Medicare Other

## 2014-05-11 ENCOUNTER — Ambulatory Visit (HOSPITAL_BASED_OUTPATIENT_CLINIC_OR_DEPARTMENT_OTHER): Payer: Medicare Other

## 2014-05-11 VITALS — BP 115/70 | HR 76 | Temp 98.1°F | Resp 18

## 2014-05-11 DIAGNOSIS — D61818 Other pancytopenia: Secondary | ICD-10-CM

## 2014-05-11 DIAGNOSIS — D469 Myelodysplastic syndrome, unspecified: Secondary | ICD-10-CM

## 2014-05-11 DIAGNOSIS — D508 Other iron deficiency anemias: Secondary | ICD-10-CM | POA: Diagnosis not present

## 2014-05-11 DIAGNOSIS — D500 Iron deficiency anemia secondary to blood loss (chronic): Secondary | ICD-10-CM

## 2014-05-11 DIAGNOSIS — D630 Anemia in neoplastic disease: Secondary | ICD-10-CM

## 2014-05-11 LAB — CBC WITH DIFFERENTIAL/PLATELET
BASO%: 0 % (ref 0.0–2.0)
Basophils Absolute: 0 10*3/uL (ref 0.0–0.1)
EOS%: 1.3 % (ref 0.0–7.0)
Eosinophils Absolute: 0 10*3/uL (ref 0.0–0.5)
HCT: 25.5 % — ABNORMAL LOW (ref 38.4–49.9)
HGB: 8.6 g/dL — ABNORMAL LOW (ref 13.0–17.1)
LYMPH#: 1 10*3/uL (ref 0.9–3.3)
LYMPH%: 64.7 % — AB (ref 14.0–49.0)
MCH: 35.5 pg — AB (ref 27.2–33.4)
MCHC: 33.7 g/dL (ref 32.0–36.0)
MCV: 105.4 fL — AB (ref 79.3–98.0)
MONO#: 0 10*3/uL — ABNORMAL LOW (ref 0.1–0.9)
MONO%: 0.7 % (ref 0.0–14.0)
NEUT#: 0.5 10*3/uL — CL (ref 1.5–6.5)
NEUT%: 33.3 % — AB (ref 39.0–75.0)
Platelets: 78 10*3/uL — ABNORMAL LOW (ref 140–400)
RBC: 2.42 10*6/uL — ABNORMAL LOW (ref 4.20–5.82)
WBC: 1.5 10*3/uL — ABNORMAL LOW (ref 4.0–10.3)
nRBC: 1 % — ABNORMAL HIGH (ref 0–0)

## 2014-05-11 LAB — HOLD TUBE, BLOOD BANK

## 2014-05-11 LAB — PREPARE RBC (CROSSMATCH)

## 2014-05-11 MED ORDER — DIPHENHYDRAMINE HCL 25 MG PO CAPS
25.0000 mg | ORAL_CAPSULE | Freq: Once | ORAL | Status: AC
  Administered 2014-05-11: 25 mg via ORAL

## 2014-05-11 MED ORDER — DIPHENHYDRAMINE HCL 25 MG PO CAPS
ORAL_CAPSULE | ORAL | Status: AC
Start: 2014-05-11 — End: 2014-05-11
  Filled 2014-05-11: qty 1

## 2014-05-11 MED ORDER — DARBEPOETIN ALFA 300 MCG/0.6ML IJ SOSY
300.0000 ug | PREFILLED_SYRINGE | Freq: Once | INTRAMUSCULAR | Status: AC
  Administered 2014-05-11: 300 ug via SUBCUTANEOUS
  Filled 2014-05-11: qty 0.6

## 2014-05-11 MED ORDER — ACETAMINOPHEN 325 MG PO TABS
ORAL_TABLET | ORAL | Status: AC
  Filled 2014-05-11: qty 2

## 2014-05-11 MED ORDER — ACETAMINOPHEN 325 MG PO TABS
650.0000 mg | ORAL_TABLET | Freq: Once | ORAL | Status: AC
  Administered 2014-05-11: 650 mg via ORAL

## 2014-05-11 MED ORDER — SODIUM CHLORIDE 0.9 % IV SOLN
250.0000 mL | Freq: Once | INTRAVENOUS | Status: AC
  Administered 2014-05-11: 250 mL via INTRAVENOUS

## 2014-05-11 NOTE — Patient Instructions (Signed)

## 2014-05-14 LAB — TYPE AND SCREEN
ABO/RH(D): A POS
Antibody Screen: NEGATIVE
UNIT DIVISION: 0

## 2014-05-16 ENCOUNTER — Encounter: Payer: Medicare Other | Admitting: Internal Medicine

## 2014-05-22 ENCOUNTER — Telehealth: Payer: Self-pay | Admitting: Internal Medicine

## 2014-05-22 MED ORDER — AMOXICILLIN-POT CLAVULANATE 875-125 MG PO TABS: 1.0000 | ORAL_TABLET | Freq: Two times a day (BID) | ORAL | Status: DC

## 2014-05-22 NOTE — Telephone Encounter (Signed)
Patient's daughter notified.  Rx sent to pharmacy. Nothing further needed.

## 2014-05-22 NOTE — Telephone Encounter (Signed)
Spoke with pt's daughter, states pt c/o sinus congestion, chest congestion, prod cough with yellow/green mucus Xseveral days.  Denies fever.  Taking Mucinex as well as maintenance medications.   Pt uses [facility] in [place].     MW please advise.  Thanks!

## 2014-05-22 NOTE — Telephone Encounter (Signed)
Augmentin 875 mg take one pill twice daily  X 10 days - take at breakfast and supper with large glass of water.  It would help reduce the usual side effects (diarrhea and yeast infections) if you ate cultured yogurt at lunch.  

## 2014-05-23 ENCOUNTER — Other Ambulatory Visit: Payer: Self-pay | Admitting: Internal Medicine

## 2014-05-25 ENCOUNTER — Other Ambulatory Visit: Payer: Medicare Other

## 2014-05-25 ENCOUNTER — Ambulatory Visit: Payer: Medicare Other

## 2014-05-25 ENCOUNTER — Ambulatory Visit (HOSPITAL_BASED_OUTPATIENT_CLINIC_OR_DEPARTMENT_OTHER): Payer: Medicare Other

## 2014-05-25 ENCOUNTER — Other Ambulatory Visit (HOSPITAL_BASED_OUTPATIENT_CLINIC_OR_DEPARTMENT_OTHER): Payer: Medicare Other

## 2014-05-25 VITALS — BP 149/70 | HR 105 | Temp 98.1°F

## 2014-05-25 DIAGNOSIS — D469 Myelodysplastic syndrome, unspecified: Secondary | ICD-10-CM | POA: Diagnosis present

## 2014-05-25 DIAGNOSIS — D500 Iron deficiency anemia secondary to blood loss (chronic): Secondary | ICD-10-CM

## 2014-05-25 DIAGNOSIS — D61818 Other pancytopenia: Secondary | ICD-10-CM

## 2014-05-25 LAB — CBC WITH DIFFERENTIAL/PLATELET
BASO%: 0 % (ref 0.0–2.0)
BASOS ABS: 0 10*3/uL (ref 0.0–0.1)
EOS%: 1.8 % (ref 0.0–7.0)
Eosinophils Absolute: 0 10*3/uL (ref 0.0–0.5)
HEMATOCRIT: 26.6 % — AB (ref 38.4–49.9)
HGB: 9.1 g/dL — ABNORMAL LOW (ref 13.0–17.1)
LYMPH%: 64.2 % — AB (ref 14.0–49.0)
MCH: 36.3 pg — ABNORMAL HIGH (ref 27.2–33.4)
MCHC: 34.2 g/dL (ref 32.0–36.0)
MCV: 106 fL — ABNORMAL HIGH (ref 79.3–98.0)
MONO#: 0 10*3/uL — ABNORMAL LOW (ref 0.1–0.9)
MONO%: 0 % (ref 0.0–14.0)
NEUT#: 0.4 10*3/uL — CL (ref 1.5–6.5)
NEUT%: 34 % — AB (ref 39.0–75.0)
Platelets: 70 10*3/uL — ABNORMAL LOW (ref 140–400)
RBC: 2.51 10*6/uL — AB (ref 4.20–5.82)
RDW: 21.7 % — AB (ref 11.0–14.6)
WBC: 1.1 10*3/uL — ABNORMAL LOW (ref 4.0–10.3)
lymph#: 0.7 10*3/uL — ABNORMAL LOW (ref 0.9–3.3)

## 2014-05-25 LAB — HOLD TUBE, BLOOD BANK

## 2014-05-25 MED ORDER — DARBEPOETIN ALFA 300 MCG/0.6ML IJ SOSY
300.0000 ug | PREFILLED_SYRINGE | Freq: Once | INTRAMUSCULAR | Status: AC
  Administered 2014-05-25: 300 ug via SUBCUTANEOUS
  Filled 2014-05-25: qty 0.6

## 2014-05-25 NOTE — Progress Notes (Signed)
HGB-9.1 today.  No need for blood transfusion today per MD parameters.

## 2014-06-04 ENCOUNTER — Encounter: Payer: Self-pay | Admitting: Internal Medicine

## 2014-06-04 ENCOUNTER — Ambulatory Visit (INDEPENDENT_AMBULATORY_CARE_PROVIDER_SITE_OTHER): Payer: Medicare Other | Admitting: Internal Medicine

## 2014-06-04 VITALS — BP 108/62 | HR 92 | Ht 71.0 in | Wt 240.8 lb

## 2014-06-04 DIAGNOSIS — I442 Atrioventricular block, complete: Secondary | ICD-10-CM

## 2014-06-04 DIAGNOSIS — I441 Atrioventricular block, second degree: Secondary | ICD-10-CM | POA: Diagnosis not present

## 2014-06-04 DIAGNOSIS — I480 Paroxysmal atrial fibrillation: Secondary | ICD-10-CM | POA: Diagnosis not present

## 2014-06-04 DIAGNOSIS — I10 Essential (primary) hypertension: Secondary | ICD-10-CM

## 2014-06-04 LAB — CUP PACEART INCLINIC DEVICE CHECK
Battery Impedance: 110 Ohm
Battery Remaining Longevity: 126 mo
Battery Voltage: 2.78 V
Brady Statistic AP VP Percent: 1 %
Brady Statistic AP VS Percent: 0 %
Brady Statistic AS VP Percent: 99 %
Date Time Interrogation Session: 20160509141320
Lead Channel Impedance Value: 426 Ohm
Lead Channel Impedance Value: 466 Ohm
Lead Channel Pacing Threshold Amplitude: 0.75 V
Lead Channel Pacing Threshold Amplitude: 0.75 V
Lead Channel Sensing Intrinsic Amplitude: 2 mV
Lead Channel Setting Pacing Amplitude: 2 V
Lead Channel Setting Pacing Pulse Width: 0.4 ms
MDC IDC MSMT LEADCHNL RA PACING THRESHOLD PULSEWIDTH: 0.4 ms
MDC IDC MSMT LEADCHNL RV PACING THRESHOLD PULSEWIDTH: 0.4 ms
MDC IDC SET LEADCHNL RV PACING AMPLITUDE: 2.5 V
MDC IDC SET LEADCHNL RV SENSING SENSITIVITY: 4 mV
MDC IDC STAT BRADY AS VS PERCENT: 0 %

## 2014-06-04 MED ORDER — NITROGLYCERIN 0.4 MG SL SUBL: 0.4000 mg | SUBLINGUAL_TABLET | SUBLINGUAL | Status: AC | PRN

## 2014-06-04 NOTE — Patient Instructions (Addendum)
Medication Instructions:  Your physician has recommended you make the following change in your medication:  1) NTG 0.4mg  as directed    Labwork: None ordered  Testing/Procedures: None ordered  Follow-Up: Your physician wants you to follow-up in: 12 months with Dr Vallery Ridge will receive a reminder letter in the mail two months in advance. If you don't receive a letter, please call our office to schedule the follow-up appointment.  Remote monitoring is used to monitor your Pacemaker or ICD from home. This monitoring reduces the number of office visits required to check your device to one time per year. It allows [place] to keep an eye on the functioning of your device to ensure it is working properly. You are scheduled for a device check from home on 09/03/14. You may send your transmission at any time that day. If you have a wireless device, the transmission will be sent automatically. After your physician reviews your transmission, you will receive a postcard with your next transmission date.    Any Other Special Instructions Will Be Listed Below (If Applicable).

## 2014-06-04 NOTE — Progress Notes (Signed)
Electrophysiology Office Note   Date:  06/04/2014   ID:  [name], DOB Jan 22, 1933, MRN [id number]  PCP:  Christinia Gully, MD   Primary Electrophysiologist: Thompson Grayer, MD    Chief Complaint  Patient presents with  . Atrial Fibrillation  . Complete AV block     History of Present Illness: [name] is a 81 y.o. male who presents today for electrophysiology evaluation.   He continues to have slow and steady decline.  He is frequently weak and fatigued.  He seems more fragile than last year.  He has SOB with moderate actvity as well as chest discomfort at times.  He describes his chest discomfort as  "burning/ warmth".  This lasts only several minutes and occurs with exertion.  Today, he denies symptoms of palpitations, orthopnea, PND, lower extremity edema, claudication, dizziness, presyncope, syncope, bleeding, or neurologic sequela. The patient is tolerating medications without difficulties and is otherwise without complaint today.    Past Medical History  Diagnosis Date  . Allergic rhinitis   . Hyperlipidemia   . Exogenous obesity   . Edema     venous insufficiency  . Second degree Mobitz II AV block 12/11/08    with transient syncope, resolved s/p PPM  . Paroxysmal atrial fibrillation 03/16/11    diagnosed by PPM interrogation  . Pancytopenia   . Postural dizziness   . CHF (congestive heart failure)   . Pacemaker   . Dyspnea     "all the time w/the temporary pacer" (12/29/2012)  . Type II diabetes mellitus 2012  . History of blood transfusion     "3 recently" (12/29/2012)  . Daily headache     "recently w/the ATB" (12/29/2012)  . Arthritis     "hips; knees" (12/29/2012)  . 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    Past Surgical History  Procedure Laterality Date  . Lymph node biopsy  04/2005    groin  . Sternotomy  2000  . Pericardiectomy  2000  . Penile prosthesis implant      and removal  .  Pacemaker insertion  12/11/08    MDT implanted by Dr Rayann Heman  . Tee without cardioversion N/A 10/31/2012    Procedure: TRANSESOPHAGEAL ECHOCARDIOGRAM (TEE);  Surgeon: Lelon Perla, MD;  Location[facility] ENDOSCOPY;  Service: Cardiovascular;  Laterality: N/A;  . Pacemaker lead removal Left 11/04/2012    Procedure: PACEMAKER LEAD REMOVAL;  Surgeon: Evans Lance, MD;  Location: [place];  Service: Cardiovascular;  Laterality: Left;  . Temporary pacemaker insertion Left 11/04/2012    Procedure: TEMPORARY PACEMAKER INSERTION;  Surgeon: Evans Lance, MD;  Location: [place];  Service: Cardiovascular;  Laterality: Left;  . Insert / replace / remove pacemaker  12/29/2012  . Bi-ventricular pacemaker upgrade N/A 12/29/2012    Procedure: BI-VENTRICULAR PACEMAKER UPGRADE;  Surgeon: Evans Lance, MD;  Location: [facility] CATH LAB;  Service: Cardiovascular;  Laterality: N/A;     Current Outpatient Prescriptions  Medication Sig Dispense Refill  . acetaminophen (TYLENOL) 500 MG tablet Take 1-2 every tablets by mouth every 4-6 hours as needed for headache    . albuterol (PROVENTIL HFA;VENTOLIN HFA) 108 (90 BASE) MCG/ACT inhaler Inhale 1-2 puffs into the lungs every 4 (four) hours as needed for wheezing or shortness of breath. 8 g 5  . amoxicillin-clavulanate (AUGMENTIN) 875-125 MG per tablet Take 1 tablet by mouth 2 (two) times daily. Take at breakfast and supper  with large glass of water.  Eat Yogurt at lunch. 20 tablet 0  . aspirin 81 MG tablet Take 81 mg by mouth every morning. (hold if bleeding)    . calcium-vitamin D (OS-CAL 500 + D) 500-200 MG-UNIT per tablet Take 1 tablet by mouth 2 (two) times daily.     . cetirizine (ZYRTEC) 10 MG tablet Take 10 mg by mouth at bedtime as needed for allergies.    . Cholecalciferol (VITAMIN D) 1000 UNITS capsule Take 1,000 Units by mouth 2 (two) times daily.      [name] dextromethorphan-guaiFENesin (MUCINEX DM) 30-600 MG per 12 hr tablet Take 1-2 tablets by mouth every 12 (twelve) hours  as needed for cough.     . docusate sodium (COLACE) 100 MG capsule Take 100 mg by mouth at bedtime.    . famotidine (PEPCID) 20 MG tablet Take 20 mg by mouth at bedtime.     . finasteride (PROSCAR) 5 MG tablet Take 5 mg by mouth every morning.     . furosemide (LASIX) 40 MG tablet TAKE 1 TABLET DAILY. MAY TAKE 1 EXTRA TABLET DAILY IF NEEDED FOR SWELLING IN LEGS 30 tablet 5  . hydrocortisone cream 1 % Apply 1 application topically 2 (two) times daily as needed (itchy rash).     . hydrOXYzine (ATARAX/VISTARIL) 25 MG tablet Take 1-2 tablets by mouth every 4-6 hours as needed for itching    . KLOR-CON M20 20 MEQ tablet TAKE 1 TABLET BY MOUTH EVERY DAY 30 tablet 5  . levothyroxine (SYNTHROID, LEVOTHROID) 25 MCG tablet TAKE 1 TABLET BY MOUTH EVERY MORNING BEFORE BREAKFAST 30 tablet 3  . metFORMIN (GLUCOPHAGE) 500 MG tablet TAKE 1 TABLET BY MOUTH TWICE A DAY WITH FOOD 60 tablet 5  . methylcellulose (CITRUCEL) oral powder Take 1 packet by mouth at bedtime.    . mometasone (NASONEX) 50 MCG/ACT nasal spray Place 0-2 sprays into the nose at bedtime. Takes at bedtime only as needed for allergies    . mometasone-formoterol (DULERA) 200-5 MCG/ACT AERO Inhale 2 puffs into the lungs 2 (two) times daily.    . Multiple Vitamin (MULTIVITAMIN) tablet Take 1 tablet by mouth daily.      [name] omeprazole (PRILOSEC) 20 MG capsule Take 40 mg by mouth every morning.     [name] oxybutynin (DITROPAN-XL) 10 MG 24 hr tablet Take 10 mg by mouth daily.    . polyethylene glycol powder (MIRALAX) powder Take 17 g by mouth daily.     . sulindac (CLINORIL) 200 MG tablet Take 200 mg by mouth 2 (two) times daily as needed (pain).     . traMADol (ULTRAM) 50 MG tablet Take 1/2 tablet by mouth every 4 hours as needed for severe pain or cough    . traZODone (DESYREL) 150 MG tablet TAKE 1/3 TABLET BY MOUTH AT BEDTIME 30 tablet 4   No current facility-administered medications for this visit.    Allergies:   Review of patient's allergies  indicates no known allergies.   Social History:  The patient  reports that he quit smoking about 46 years ago. His smoking use included Cigarettes. He has a 30 pack-year smoking history. He has quit using smokeless tobacco. His smokeless tobacco use included Chew. He reports that he does not drink alcohol or use illicit drugs.   Family History:  The patient's family history includes Cancer in his sister; Diabetes in his mother; Liver cancer in his brother.    ROS:  Please see the history of present  illness.   All other systems are reviewed and negative.    PHYSICAL EXAM: VS:  BP 108/62 mmHg  Pulse 92  Ht 5\' 11"  (1.803 m)  Wt 240 lb 12.8 oz (109.226 kg)  BMI 33.60 kg/m2 , BMI Body mass index is 33.6 kg/(m^2). GEN: elderly and frail, in no acute distress HEENT: normal, pale conjunctiva (mild) Neck: no JVD, carotid bruits, or masses Cardiac: RRR; no murmurs, rubs, or gallops, trace edema  Respiratory:  clear to auscultation bilaterally, normal work of breathing GI: soft, nontender, nondistended, + BS MS: no deformity or atrophy Skin: warm and dry, device pocket is well healed Neuro:  Strength and sensation are intact Psych: euthymic mood, full affect  Device interrogation is reviewed today in detail.  See PaceArt for details.   Recent Labs: 12/18/2013: BUN 16; Creatinine 0.9; Potassium 3.4*; Pro B Natriuretic peptide (BNP) 232.0*; Sodium 139; TSH 2.79 05/25/2014: Hemoglobin 9.1*; Platelets 70*    Lipid Panel     Component Value Date/Time   CHOL 142 11/08/2007 0000   TRIG 184* 11/08/2007 0000   HDL 28.6* 11/08/2007 0000   CHOLHDL 5.0 CALC 11/08/2007 0000   VLDL 37 11/08/2007 0000   LDLCALC 77 11/08/2007 0000   LDLDIRECT 66.9 11/08/2006 0944     Wt Readings from Last 3 Encounters:  06/04/14 240 lb 12.8 oz (109.226 kg)  04/27/14 237 lb 12.8 oz (107.865 kg)  03/02/14 235 lb 9.6 oz (106.867 kg)     ASSESSMENT AND PLAN:  1.  Complete heart block Normal pacemaker  function See Pace Art report No changes today  2. afib Not a candidate for oral anticoagulation due to pancytopenia May need to stop aspirin if anemia worsens  3. htn Stable No change required today  End of life wishes were discussed in detail with patient and daughter today.  He will continue to contemplate this option.  He has told his daughter previously that he does not wish to be intubated though I am not sure that he has a full decision regarding EOL.  carelink Return in 1 year   Current medicines are reviewed at length with the patient today.   The patient does not have concerns regarding his medicines.  The following changes were made today:  none  Labs/ tests ordered today include:  Orders Placed This Encounter  Procedures  . Implantable device check    Signed, Thompson Grayer, MD  06/04/2014 2:25 PM     [address] [place] [address] [phone] (office) [phone] (fax)

## 2014-06-08 ENCOUNTER — Ambulatory Visit (HOSPITAL_BASED_OUTPATIENT_CLINIC_OR_DEPARTMENT_OTHER): Payer: Medicare Other

## 2014-06-08 ENCOUNTER — Other Ambulatory Visit: Payer: Medicare Other

## 2014-06-08 ENCOUNTER — Other Ambulatory Visit (HOSPITAL_BASED_OUTPATIENT_CLINIC_OR_DEPARTMENT_OTHER): Payer: Medicare Other

## 2014-06-08 ENCOUNTER — Ambulatory Visit: Payer: Medicare Other

## 2014-06-08 ENCOUNTER — Other Ambulatory Visit: Payer: Self-pay | Admitting: *Deleted

## 2014-06-08 ENCOUNTER — Ambulatory Visit (HOSPITAL_COMMUNITY)
Admission: RE | Admit: 2014-06-08 | Discharge: 2014-06-08 | Disposition: A | Discharge status: Home / Self Care | Payer: Medicare Other | Source: Ambulatory Visit | Attending: Oncology | Admitting: Oncology

## 2014-06-08 VITALS — BP 134/72 | HR 66 | Temp 97.7°F | Resp 18

## 2014-06-08 VITALS — BP 120/60 | HR 83 | Temp 98.9°F

## 2014-06-08 DIAGNOSIS — D469 Myelodysplastic syndrome, unspecified: Secondary | ICD-10-CM

## 2014-06-08 DIAGNOSIS — D61818 Other pancytopenia: Secondary | ICD-10-CM

## 2014-06-08 DIAGNOSIS — D649 Anemia, unspecified: Secondary | ICD-10-CM

## 2014-06-08 DIAGNOSIS — D508 Other iron deficiency anemias: Secondary | ICD-10-CM | POA: Diagnosis present

## 2014-06-08 DIAGNOSIS — D500 Iron deficiency anemia secondary to blood loss (chronic): Secondary | ICD-10-CM

## 2014-06-08 LAB — CBC WITH DIFFERENTIAL/PLATELET
BASO%: 0 % (ref 0.0–2.0)
Basophils Absolute: 0 10*3/uL (ref 0.0–0.1)
EOS%: 0.7 % (ref 0.0–7.0)
Eosinophils Absolute: 0 10*3/uL (ref 0.0–0.5)
HEMATOCRIT: 25 % — AB (ref 38.4–49.9)
HGB: 8.5 g/dL — ABNORMAL LOW (ref 13.0–17.1)
LYMPH#: 0.9 10*3/uL (ref 0.9–3.3)
LYMPH%: 63.2 % — ABNORMAL HIGH (ref 14.0–49.0)
MCH: 36 pg — ABNORMAL HIGH (ref 27.2–33.4)
MCHC: 34 g/dL (ref 32.0–36.0)
MCV: 105.9 fL — ABNORMAL HIGH (ref 79.3–98.0)
MONO#: 0 10*3/uL — AB (ref 0.1–0.9)
MONO%: 1.4 % (ref 0.0–14.0)
NEUT#: 0.5 10*3/uL — CL (ref 1.5–6.5)
NEUT%: 34.7 % — ABNORMAL LOW (ref 39.0–75.0)
Platelets: 75 10*3/uL — ABNORMAL LOW (ref 140–400)
RBC: 2.36 10*6/uL — ABNORMAL LOW (ref 4.20–5.82)
RDW: 22 % — ABNORMAL HIGH (ref 11.0–14.6)
WBC: 1.4 10*3/uL — ABNORMAL LOW (ref 4.0–10.3)
nRBC: 2 % — ABNORMAL HIGH (ref 0–0)

## 2014-06-08 LAB — HOLD TUBE, BLOOD BANK

## 2014-06-08 LAB — PREPARE RBC (CROSSMATCH)

## 2014-06-08 MED ORDER — DARBEPOETIN ALFA 300 MCG/0.6ML IJ SOSY
300.0000 ug | PREFILLED_SYRINGE | Freq: Once | INTRAMUSCULAR | Status: AC
  Administered 2014-06-08: 300 ug via SUBCUTANEOUS
  Filled 2014-06-08: qty 0.6

## 2014-06-08 MED ORDER — FUROSEMIDE 10 MG/ML IJ SOLN: 20.0000 mg | Freq: Once | INTRAMUSCULAR | Status: DC

## 2014-06-08 MED ORDER — SODIUM CHLORIDE 0.9 % IV SOLN
250.0000 mL | Freq: Once | INTRAVENOUS | Status: AC
  Administered 2014-06-08: 250 mL via INTRAVENOUS

## 2014-06-08 MED ORDER — ACETAMINOPHEN 325 MG PO TABS
650.0000 mg | ORAL_TABLET | Freq: Once | ORAL | Status: AC
  Administered 2014-06-08: 650 mg via ORAL

## 2014-06-08 MED ORDER — ACETAMINOPHEN 325 MG PO TABS
ORAL_TABLET | ORAL | Status: AC
  Filled 2014-06-08: qty 2

## 2014-06-08 MED ORDER — DIPHENHYDRAMINE HCL 25 MG PO CAPS
ORAL_CAPSULE | ORAL | Status: AC
  Filled 2014-06-08: qty 1

## 2014-06-08 MED ORDER — DIPHENHYDRAMINE HCL 25 MG PO CAPS
25.0000 mg | ORAL_CAPSULE | Freq: Once | ORAL | Status: AC
  Administered 2014-06-08: 25 mg via ORAL

## 2014-06-08 NOTE — Progress Notes (Signed)
Complaining of being weak,  SOB with activity and no endurance.  Also can't do any activity with stopping for a rest.  Will receive blood transfusion today

## 2014-06-08 NOTE — Patient Instructions (Signed)

## 2014-06-10 LAB — TYPE AND SCREEN
ABO/RH(D): A POS
ANTIBODY SCREEN: NEGATIVE
Unit division: 0
Unit division: 0

## 2014-06-20 ENCOUNTER — Other Ambulatory Visit: Payer: Self-pay | Admitting: Internal Medicine

## 2014-06-21 ENCOUNTER — Other Ambulatory Visit: Payer: Self-pay | Admitting: Internal Medicine

## 2014-06-22 ENCOUNTER — Encounter: Payer: Self-pay | Admitting: Physician Assistant

## 2014-06-22 ENCOUNTER — Other Ambulatory Visit (HOSPITAL_BASED_OUTPATIENT_CLINIC_OR_DEPARTMENT_OTHER): Payer: Medicare Other

## 2014-06-22 ENCOUNTER — Ambulatory Visit: Payer: Medicare Other | Admitting: Physician Assistant

## 2014-06-22 ENCOUNTER — Ambulatory Visit: Payer: Medicare Other

## 2014-06-22 ENCOUNTER — Other Ambulatory Visit: Payer: Medicare Other

## 2014-06-22 ENCOUNTER — Ambulatory Visit (HOSPITAL_BASED_OUTPATIENT_CLINIC_OR_DEPARTMENT_OTHER): Payer: Medicare Other

## 2014-06-22 ENCOUNTER — Telehealth: Payer: Self-pay | Admitting: *Deleted

## 2014-06-22 ENCOUNTER — Ambulatory Visit (HOSPITAL_BASED_OUTPATIENT_CLINIC_OR_DEPARTMENT_OTHER): Payer: Medicare Other | Admitting: Physician Assistant

## 2014-06-22 ENCOUNTER — Telehealth: Payer: Self-pay | Admitting: Physician Assistant

## 2014-06-22 VITALS — BP 109/38 | HR 98 | Temp 97.7°F | Resp 17 | Ht 71.0 in | Wt 239.4 lb

## 2014-06-22 DIAGNOSIS — D469 Myelodysplastic syndrome, unspecified: Secondary | ICD-10-CM

## 2014-06-22 DIAGNOSIS — D61818 Other pancytopenia: Secondary | ICD-10-CM

## 2014-06-22 DIAGNOSIS — D709 Neutropenia, unspecified: Secondary | ICD-10-CM

## 2014-06-22 DIAGNOSIS — D472 Monoclonal gammopathy: Secondary | ICD-10-CM

## 2014-06-22 DIAGNOSIS — Z8572 Personal history of non-Hodgkin lymphomas: Secondary | ICD-10-CM | POA: Diagnosis not present

## 2014-06-22 DIAGNOSIS — D500 Iron deficiency anemia secondary to blood loss (chronic): Secondary | ICD-10-CM

## 2014-06-22 LAB — CBC WITH DIFFERENTIAL/PLATELET
BASO%: 0.3 % (ref 0.0–2.0)
Basophils Absolute: 0 10*3/uL (ref 0.0–0.1)
EOS%: 0.7 % (ref 0.0–7.0)
Eosinophils Absolute: 0 10*3/uL (ref 0.0–0.5)
HCT: 29.2 % — ABNORMAL LOW (ref 38.4–49.9)
HGB: 9.8 g/dL — ABNORMAL LOW (ref 13.0–17.1)
LYMPH%: 56.5 % — AB (ref 14.0–49.0)
MCH: 35.7 pg — ABNORMAL HIGH (ref 27.2–33.4)
MCHC: 33.6 g/dL (ref 32.0–36.0)
MCV: 106 fL — ABNORMAL HIGH (ref 79.3–98.0)
MONO#: 0 10*3/uL — AB (ref 0.1–0.9)
MONO%: 0.6 % (ref 0.0–14.0)
NEUT%: 41.9 % (ref 39.0–75.0)
NEUTROS ABS: 0.7 10*3/uL — AB (ref 1.5–6.5)
Platelets: 85 10*3/uL — ABNORMAL LOW (ref 140–400)
RBC: 2.76 10*6/uL — AB (ref 4.20–5.82)
RDW: 24.8 % — AB (ref 11.0–14.6)
WBC: 1.6 10*3/uL — ABNORMAL LOW (ref 4.0–10.3)
lymph#: 0.9 10*3/uL (ref 0.9–3.3)

## 2014-06-22 LAB — HOLD TUBE, BLOOD BANK

## 2014-06-22 MED ORDER — DARBEPOETIN ALFA 300 MCG/0.6ML IJ SOSY
300.0000 ug | PREFILLED_SYRINGE | Freq: Once | INTRAMUSCULAR | Status: AC
  Administered 2014-06-22: 300 ug via SUBCUTANEOUS
  Filled 2014-06-22: qty 0.6

## 2014-06-22 NOTE — Telephone Encounter (Signed)
per Bari Mantis stated ok to sch 7/6 w. Shadad-sch appt-sent MW emailt o sch blood-gave pt copy of sch-pt daughter stated has MY CHART and will review updated sch

## 2014-06-22 NOTE — Telephone Encounter (Signed)
per pof to sch pt appt-gave pt copy of sch-sent MW email to sch blood-pt aware

## 2014-06-22 NOTE — Telephone Encounter (Signed)
Per staff message and POF I have scheduled appts. Advised scheduler of appts. JMW  

## 2014-06-22 NOTE — Patient Instructions (Signed)
Darbepoetin Alfa injection What is this medicine? DARBEPOETIN ALFA (dar be POE e tin AL fa) helps your body make more red blood cells. It is used to treat anemia caused by chronic kidney failure and chemotherapy. This medicine may be used for other purposes; ask your health care provider or pharmacist if you have questions. COMMON BRAND NAME(S): Aranesp What should I tell my health care provider before I take this medicine? They need to know if you have any of these conditions: -blood clotting disorders or history of blood clots -cancer patient not on chemotherapy -cystic fibrosis -heart disease, such as angina, heart failure, or a history of a heart attack -hemoglobin level of 12 g/dL or greater -high blood pressure -low levels of folate, iron, or vitamin B12 -seizures -an unusual or allergic reaction to darbepoetin, erythropoietin, albumin, hamster proteins, latex, other medicines, foods, dyes, or preservatives -pregnant or trying to get pregnant -breast-feeding How should I use this medicine? This medicine is for injection into a vein or under the skin. It is usually given by a health care professional in a hospital or clinic setting. If you get this medicine at home, you will be taught how to prepare and give this medicine. Do not shake the solution before you withdraw a dose. Use exactly as directed. Take your medicine at regular intervals. Do not take your medicine more often than directed. It is important that you put your used needles and syringes in a special sharps container. Do not put them in a trash can. If you do not have a sharps container, call your pharmacist or healthcare provider to get one. Talk to your pediatrician regarding the use of this medicine in children. While this medicine may be used in children as young as 1 year for selected conditions, precautions do apply. Overdosage: If you think you have taken too much of this medicine contact a poison control center or  emergency room at once. NOTE: This medicine is only for you. Do not share this medicine with others. What if I miss a dose? If you miss a dose, take it as soon as you can. If it is almost time for your next dose, take only that dose. Do not take double or extra doses. What may interact with this medicine? Do not take this medicine with any of the following medications: -epoetin alfa This list may not describe all possible interactions. Give your health care provider a list of all the medicines, herbs, non-prescription drugs, or dietary supplements you use. Also tell them if you smoke, drink alcohol, or use illegal drugs. Some items may interact with your medicine. What should I watch for while using this medicine? Visit your prescriber or health care professional for regular checks on your progress and for the needed blood tests and blood pressure measurements. It is especially important for the doctor to make sure your hemoglobin level is in the desired range, to limit the risk of potential side effects and to give you the best benefit. Keep all appointments for any recommended tests. Check your blood pressure as directed. Ask your doctor what your blood pressure should be and when you should contact him or her. As your body makes more red blood cells, you may need to take iron, folic acid, or vitamin B supplements. Ask your doctor or health care provider which products are right for you. If you have kidney disease continue dietary restrictions, even though this medication can make you feel better. Talk with your doctor or health   care professional about the foods you eat and the vitamins that you take. What side effects may I notice from receiving this medicine? Side effects that you should report to your doctor or health care professional as soon as possible: -allergic reactions like skin rash, itching or hives, swelling of the face, lips, or tongue -breathing problems -changes in vision -chest  pain -confusion, trouble speaking or understanding -feeling faint or lightheaded, falls -high blood pressure -muscle aches or pains -pain, swelling, warmth in the leg -rapid weight gain -severe headaches -sudden numbness or weakness of the face, arm or leg -trouble walking, dizziness, loss of balance or coordination -seizures (convulsions) -swelling of the ankles, feet, hands -unusually weak or tired Side effects that usually do not require medical attention (report to your doctor or health care professional if they continue or are bothersome): -diarrhea -fever, chills (flu-like symptoms) -headaches -nausea, vomiting -redness, stinging, or swelling at site where injected This list may not describe all possible side effects. Call your doctor for medical advice about side effects. You may report side effects to FDA at 1-800-FDA-1088. Where should I keep my medicine? Keep out of the reach of children. Store in a refrigerator between 2 and 8 degrees C (36 and 46 degrees F). Do not freeze. Do not shake. Throw away any unused portion if using a single-dose vial. Throw away any unused medicine after the expiration date. NOTE: This sheet is a summary. It may not cover all possible information. If you have questions about this medicine, talk to your doctor, pharmacist, or health care provider.  2015, [employer]vier/[employer] Standard. (2007-12-27 10:23:57)  

## 2014-06-22 NOTE — Progress Notes (Signed)
Hematology and Oncology Follow Up Visit  [name] [id number] [birth year] 81 y.o. 06/22/2014 3:30 PM  CC: [name]. Victor Novas, MD, FCCP    Principle Diagnosis: A 81 year old gentleman with the following diagnoses:   1. Follicular lymphoma, grade 3, stage III, diagnosed 2007. Continued to be in remission. 2. Pancytopenia associated with mild neutropenia and macrocytosis possibly indicating early myelodysplasia. Bone marrow  was repeated on 06/22/2012 confirmed the evidence of myelodysplastic syndrome 3. Monoclonal gammopathy of undetermined significance.  Prior Therapy:  1. Status post CHOP and rituximab.  He had complete response to therapy concluded in 2007.  No further imaging has been indicated at this time. 2. Status post bone marrow biopsy in April 2011, did not really show any evidence of myelodysplasia or metastatic lymphoma at this time.  Current therapy: Aranesp 300 mcg every 2weeks to keep his hemoglobin close to 11. He also has been receiving intermittent PRBC transfusions if his hemoglobin below 9.  Interim History: [name] presents today for a followup visit with his daughter. Since his last visit, he has been clinically stable.  He is not reporting any chest pain or dyspnea on exertion today.  He does spend the majority of his day in his house does go out occasionally and drives short distances. He has not reported any recurrent sinopulmonary infections. Did not report any cellulitis.  He has not noticed any further decline in his energy her performance status. No further bleeding episodes noted.  He reports no chest pain. He does not report any dysphagia.  He does not report any lymphadenopathy. Denies night sweats. He reports no syncope or falls. He uses a cane to ambulate at this time. He did not report any headaches or change in his vision. Does not report any seizures or confusion. He does not report any palpitation or orthopnea. Has not reported any edema. Does not report  any wheezing but still have exertional dyspnea. Is not reporting any abdominal pain but his appetite had been down slightly,however his weight has remained relatively stable recently.  He does not report any lymphadenopathy or petechiae. Has not reported any skin rashes or genitourinary complaints. Remainder of review of systems unremarkable.   Medications: I have reviewed the patient's current medications Current Outpatient Prescriptions  Medication Sig Dispense Refill  . acetaminophen (TYLENOL) 500 MG tablet Take 1-2 every tablets by mouth every 4-6 hours as needed for headache    . albuterol (PROVENTIL HFA;VENTOLIN HFA) 108 (90 BASE) MCG/ACT inhaler Inhale 1-2 puffs into the lungs every 4 (four) hours as needed for wheezing or shortness of breath. 8 g 5  . aspirin 81 MG tablet Take 81 mg by mouth every morning. (hold if bleeding)    . calcium-vitamin D (OS-CAL 500 + D) 500-200 MG-UNIT per tablet Take 1 tablet by mouth 2 (two) times daily.     . cetirizine (ZYRTEC) 10 MG tablet Take 10 mg by mouth at bedtime as needed for allergies.    . Cholecalciferol (VITAMIN D) 1000 UNITS capsule Take 1,000 Units by mouth 2 (two) times daily.      [name] dextromethorphan-guaiFENesin (MUCINEX DM) 30-600 MG per 12 hr tablet Take 1-2 tablets by mouth every 12 (twelve) hours as needed for cough.     . docusate sodium (COLACE) 100 MG capsule Take 100 mg by mouth at bedtime.    . famotidine (PEPCID) 20 MG tablet Take 20 mg by mouth at bedtime.     . finasteride (PROSCAR) 5 MG tablet Take 5  mg by mouth every morning.     . furosemide (LASIX) 40 MG tablet TAKE 1 TABLET DAILY. MAY TAKE 1 EXTRA TABLET DAILY IF NEEDED FOR SWELLING IN LEGS 30 tablet 5  . hydrocortisone cream 1 % Apply 1 application topically 2 (two) times daily as needed (itchy rash).     . hydrOXYzine (ATARAX/VISTARIL) 25 MG tablet Take 1-2 tablets by mouth every 4-6 hours as needed for itching    . KLOR-CON M20 20 MEQ tablet TAKE 1 TABLET BY MOUTH EVERY  DAY 30 tablet 5  . levothyroxine (SYNTHROID, LEVOTHROID) 25 MCG tablet TAKE 1 TABLET BY MOUTH EVERY MORNING BEFORE BREAKFAST 30 tablet 3  . metFORMIN (GLUCOPHAGE) 500 MG tablet TAKE 1 TABLET BY MOUTH TWICE A DAY WITH FOOD 60 tablet 5  . methylcellulose (CITRUCEL) oral powder Take 1 packet by mouth at bedtime.    . mometasone (NASONEX) 50 MCG/ACT nasal spray Place 0-2 sprays into the nose at bedtime. Takes at bedtime only as needed for allergies    . mometasone-formoterol (DULERA) 200-5 MCG/ACT AERO Inhale 2 puffs into the lungs 2 (two) times daily.    . Multiple Vitamin (MULTIVITAMIN) tablet Take 1 tablet by mouth daily.      [name] omeprazole (PRILOSEC) 20 MG capsule Take 40 mg by mouth every morning.     [name] oxybutynin (DITROPAN-XL) 10 MG 24 hr tablet Take 10 mg by mouth daily.    . polyethylene glycol powder (MIRALAX) powder Take 17 g by mouth daily.     . sulindac (CLINORIL) 200 MG tablet Take 200 mg by mouth 2 (two) times daily as needed (pain).     . traMADol (ULTRAM) 50 MG tablet Take 1/2 tablet by mouth every 4 hours as needed for severe pain or cough    . traZODone (DESYREL) 150 MG tablet TAKE 1/3 TABLET BY MOUTH AT BEDTIME 30 tablet 4  . amoxicillin-clavulanate (AUGMENTIN) 875-125 MG per tablet Take 1 tablet by mouth 2 (two) times daily. Take at breakfast and supper with large glass of water.  Eat Yogurt at lunch. (Patient not taking: Reported on 06/22/2014) 20 tablet 0  . nitroGLYCERIN (NITROSTAT) 0.4 MG SL tablet Place 1 tablet (0.4 mg total) under the tongue every 5 (five) minutes as needed for chest pain. (Patient not taking: Reported on 06/22/2014) 90 tablet 3   No current facility-administered medications for this visit.     Allergies: No Known Allergies  Past Medical History, Surgical history, Social history, and Family History were reviewed and updated.    Physical Exam: Blood pressure 109/38, pulse 98, temperature 97.7 F (36.5 C), temperature source Oral, resp. rate 17, height  $Remov'5\' 11"'ySwSIZ$  (1.803 m), weight 239 lb 6.4 oz (108.591 kg), SpO2 97 %. ECOG: 1 General appearance: alert, slightly pale not in any distress. Head: Normocephalic, without obvious abnormality Neck: no adenopathy Lymph nodes: Cervical, supraclavicular, and axillary nodes normal. Heart:regular rate and rhythm, S1, S2 normal, no murmur, click, rub or gallop Lung:chest clear, no wheezing, rales, normal symmetric air entry. No dullness to percussion. Abdomen: soft, non-tender, without masses or organomegaly. No shifting dullness or ascites. EXT:no erythema, induration, or nodules  skin: No rashes or lesions noted.    Lab Results: Lab Results  Component Value Date   WBC 1.6* 06/22/2014   HGB 9.8* 06/22/2014   HCT 29.2* 06/22/2014   MCV 106.0* 06/22/2014   PLT 85* 06/22/2014     Chemistry      Component Value Date/Time   NA 139 12/18/2013 1013  NA 137 01/05/2013 1501   K 3.4* 12/18/2013 1013   K 3.8 01/05/2013 1501   CL 102 12/18/2013 1013   CL 99 06/08/2012 1257   CO2 26 12/18/2013 1013   CO2 26 01/05/2013 1501   BUN 16 12/18/2013 1013   BUN 13.9 01/05/2013 1501   CREATININE 0.9 12/18/2013 1013   CREATININE 0.8 01/05/2013 1501      Component Value Date/Time   CALCIUM 9.9 12/18/2013 1013   CALCIUM 10.1 01/05/2013 1501   ALKPHOS 82 12/06/2012 1850   ALKPHOS 48 08/10/2012 1117   AST 17 12/06/2012 1850   AST 17 08/10/2012 1117   ALT <5 12/06/2012 1850   ALT 14 08/10/2012 1117   BILITOT 0.7 12/06/2012 1850   BILITOT 0.82 08/10/2012 1117      Impression and Plan:  This is a 81 year old gentleman with the following issues:  1. Follicular lymphoma diagnosed in 2007. He has no evidence to suggest recurrent disease. 2. Pancytopenia due to myelodysplastic syndrome. He is status post bone marrow biopsy was done on 06/22/2012.  He is to continue on Aranesp 300 mcg every 2 weeks as well as intermittent transfusions. His hemoglobin is adequate today and does not require a transfusion but  we will proceed with Aranesp injection. The plan is to keep him on every 2 weeks follow-up and transfusion for hemoglobin less than 9 and proceed with Aranesp for any hemoglobin below 11. his performance status and activity level remained stable without any further decline.  3. Neutropenia: Neutropenic precautions were given to the patient today. See no need for any growth factor support since he has not had any clinical infections.  4. Monoclonal gammopathy.  His M-spike had been less than 1 g/dL, his bone marrow biopsy continued to show a less than 8% plasma cells indicating most likely multiple myeloma.   5. Followup every 2 weeks for an injection and possible transfusion. He will have a clinical visit in 8 weeks.  Awilda Metro E,PA-C  5/27/20163:30 PM

## 2014-06-25 NOTE — Patient Instructions (Signed)
Continue every 2 week Aranesp injections and blood transfusions as needed Follow up in 2 months

## 2014-06-27 ENCOUNTER — Ambulatory Visit (HOSPITAL_COMMUNITY)
Admission: RE | Admit: 2014-06-27 | Discharge: 2014-06-27 | Disposition: A | Discharge status: Home / Self Care | Payer: Medicare Other | Source: Ambulatory Visit | Attending: Oncology | Admitting: Oncology

## 2014-06-27 DIAGNOSIS — D508 Other iron deficiency anemias: Secondary | ICD-10-CM | POA: Insufficient documentation

## 2014-06-27 DIAGNOSIS — C8590 Non-Hodgkin lymphoma, unspecified, unspecified site: Secondary | ICD-10-CM | POA: Insufficient documentation

## 2014-07-06 ENCOUNTER — Ambulatory Visit (HOSPITAL_BASED_OUTPATIENT_CLINIC_OR_DEPARTMENT_OTHER): Payer: Medicare Other

## 2014-07-06 ENCOUNTER — Other Ambulatory Visit: Payer: Medicare Other

## 2014-07-06 ENCOUNTER — Ambulatory Visit (HOSPITAL_COMMUNITY)
Admission: RE | Admit: 2014-07-06 | Discharge: 2014-07-06 | Disposition: A | Discharge status: Home / Self Care | Payer: Medicare Other | Source: Ambulatory Visit | Attending: Oncology | Admitting: Oncology

## 2014-07-06 ENCOUNTER — Other Ambulatory Visit: Payer: Self-pay | Admitting: *Deleted

## 2014-07-06 ENCOUNTER — Other Ambulatory Visit (HOSPITAL_BASED_OUTPATIENT_CLINIC_OR_DEPARTMENT_OTHER): Payer: Medicare Other

## 2014-07-06 ENCOUNTER — Ambulatory Visit: Payer: Medicare Other

## 2014-07-06 VITALS — BP 129/63 | HR 95 | Temp 98.1°F

## 2014-07-06 VITALS — BP 125/59 | HR 66 | Temp 97.8°F | Resp 18

## 2014-07-06 DIAGNOSIS — C8590 Non-Hodgkin lymphoma, unspecified, unspecified site: Secondary | ICD-10-CM

## 2014-07-06 DIAGNOSIS — D61818 Other pancytopenia: Secondary | ICD-10-CM

## 2014-07-06 DIAGNOSIS — D500 Iron deficiency anemia secondary to blood loss (chronic): Secondary | ICD-10-CM

## 2014-07-06 DIAGNOSIS — D508 Other iron deficiency anemias: Secondary | ICD-10-CM

## 2014-07-06 DIAGNOSIS — D469 Myelodysplastic syndrome, unspecified: Secondary | ICD-10-CM

## 2014-07-06 DIAGNOSIS — D472 Monoclonal gammopathy: Secondary | ICD-10-CM

## 2014-07-06 LAB — CBC WITH DIFFERENTIAL/PLATELET
BASO%: 0.4 % (ref 0.0–2.0)
Basophils Absolute: 0 10*3/uL (ref 0.0–0.1)
EOS ABS: 0 10*3/uL (ref 0.0–0.5)
EOS%: 0.7 % (ref 0.0–7.0)
HCT: 25.8 % — ABNORMAL LOW (ref 38.4–49.9)
HEMOGLOBIN: 8.8 g/dL — AB (ref 13.0–17.1)
LYMPH#: 1 10*3/uL (ref 0.9–3.3)
LYMPH%: 66.5 % — ABNORMAL HIGH (ref 14.0–49.0)
MCH: 37.1 pg — ABNORMAL HIGH (ref 27.2–33.4)
MCHC: 34.3 g/dL (ref 32.0–36.0)
MCV: 108.2 fL — ABNORMAL HIGH (ref 79.3–98.0)
MONO#: 0 10*3/uL — ABNORMAL LOW (ref 0.1–0.9)
MONO%: 1 % (ref 0.0–14.0)
NEUT#: 0.5 10*3/uL — CL (ref 1.5–6.5)
NEUT%: 31.4 % — ABNORMAL LOW (ref 39.0–75.0)
Platelets: 95 10*3/uL — ABNORMAL LOW (ref 140–400)
RBC: 2.38 10*6/uL — ABNORMAL LOW (ref 4.20–5.82)
RDW: 24.7 % — AB (ref 11.0–14.6)
WBC: 1.4 10*3/uL — ABNORMAL LOW (ref 4.0–10.3)

## 2014-07-06 LAB — PREPARE RBC (CROSSMATCH)

## 2014-07-06 LAB — HOLD TUBE, BLOOD BANK

## 2014-07-06 MED ORDER — DIPHENHYDRAMINE HCL 25 MG PO CAPS
ORAL_CAPSULE | ORAL | Status: AC
  Filled 2014-07-06: qty 1

## 2014-07-06 MED ORDER — DIPHENHYDRAMINE HCL 25 MG PO CAPS
25.0000 mg | ORAL_CAPSULE | Freq: Once | ORAL | Status: AC
  Administered 2014-07-06: 25 mg via ORAL

## 2014-07-06 MED ORDER — SODIUM CHLORIDE 0.9 % IV SOLN
250.0000 mL | Freq: Once | INTRAVENOUS | Status: AC
  Administered 2014-07-06: 250 mL via INTRAVENOUS

## 2014-07-06 MED ORDER — ACETAMINOPHEN 325 MG PO TABS
ORAL_TABLET | ORAL | Status: AC
  Filled 2014-07-06: qty 2

## 2014-07-06 MED ORDER — ACETAMINOPHEN 325 MG PO TABS
650.0000 mg | ORAL_TABLET | Freq: Once | ORAL | Status: AC
  Administered 2014-07-06: 650 mg via ORAL

## 2014-07-06 MED ORDER — DARBEPOETIN ALFA 300 MCG/0.6ML IJ SOSY
300.0000 ug | PREFILLED_SYRINGE | Freq: Once | INTRAMUSCULAR | Status: AC
  Administered 2014-07-06: 300 ug via SUBCUTANEOUS
  Filled 2014-07-06: qty 0.6

## 2014-07-06 NOTE — Patient Instructions (Signed)

## 2014-07-06 NOTE — Progress Notes (Signed)
Patient reports seeing a small amount of blood after straining to have a BM last week. States that it was not in his stool. Pt encouraged to monitor and call clinic if bleeding worsens. Pt verbalizes understanding.

## 2014-07-09 LAB — TYPE AND SCREEN
ABO/RH(D): A POS
ANTIBODY SCREEN: NEGATIVE
UNIT DIVISION: 0
Unit division: 0

## 2014-07-17 ENCOUNTER — Telehealth: Payer: Self-pay | Admitting: Internal Medicine

## 2014-07-17 NOTE — Telephone Encounter (Signed)
Spoke with pt's daughter.  Pt c/o  Chest congestion, cough, left side pain in rib area.  Appt given with Dr [name] 07/18/14 at 1:30.

## 2014-07-18 ENCOUNTER — Ambulatory Visit (INDEPENDENT_AMBULATORY_CARE_PROVIDER_SITE_OTHER)
Admission: RE | Admit: 2014-07-18 | Discharge: 2014-07-18 | Disposition: A | Discharge status: Home / Self Care | Payer: Medicare Other | Source: Ambulatory Visit | Attending: Internal Medicine | Admitting: Internal Medicine

## 2014-07-18 ENCOUNTER — Other Ambulatory Visit (INDEPENDENT_AMBULATORY_CARE_PROVIDER_SITE_OTHER): Payer: Medicare Other

## 2014-07-18 ENCOUNTER — Encounter: Payer: Self-pay | Admitting: Internal Medicine

## 2014-07-18 ENCOUNTER — Ambulatory Visit (INDEPENDENT_AMBULATORY_CARE_PROVIDER_SITE_OTHER): Payer: Medicare Other | Admitting: Internal Medicine

## 2014-07-18 VITALS — BP 126/70 | HR 96 | Ht 71.0 in | Wt 242.0 lb

## 2014-07-18 DIAGNOSIS — J45991 Cough variant asthma: Secondary | ICD-10-CM

## 2014-07-18 DIAGNOSIS — E1365 Other specified diabetes mellitus with hyperglycemia: Secondary | ICD-10-CM | POA: Diagnosis not present

## 2014-07-18 DIAGNOSIS — IMO0002 Reserved for concepts with insufficient information to code with codable children: Secondary | ICD-10-CM

## 2014-07-18 DIAGNOSIS — E669 Obesity, unspecified: Secondary | ICD-10-CM

## 2014-07-18 LAB — BASIC METABOLIC PANEL
BUN: 14 mg/dL (ref 6–23)
CALCIUM: 9.7 mg/dL (ref 8.4–10.5)
CO2: 29 mEq/L (ref 19–32)
CREATININE: 0.85 mg/dL (ref 0.40–1.50)
Chloride: 99 mEq/L (ref 96–112)
GFR: 92.2 mL/min (ref >60.00)
Glucose, Bld: 142 mg/dL — ABNORMAL HIGH (ref 70–99)
Potassium: 3.8 mEq/L (ref 3.5–5.1)
SODIUM: 136 meq/L (ref 135–145)

## 2014-07-18 LAB — HEMOGLOBIN A1C: Hgb A1c MFr Bld: 6.4 % (ref 4.6–6.5)

## 2014-07-18 MED ORDER — TRAMADOL HCL 50 MG PO TABS: ORAL_TABLET | ORAL | Status: DC

## 2014-07-18 MED ORDER — AMOXICILLIN-POT CLAVULANATE 875-125 MG PO TABS: 1.0000 | ORAL_TABLET | Freq: Two times a day (BID) | ORAL | Status: DC

## 2014-07-18 MED ORDER — PREDNISONE 10 MG PO TABS: ORAL_TABLET | ORAL | Status: DC

## 2014-07-18 NOTE — Progress Notes (Signed)
Quick Note:  LMTCB ______ 

## 2014-07-18 NOTE — Progress Notes (Signed)
Quick Note:  lmtcb ______ 

## 2014-07-18 NOTE — Progress Notes (Signed)
Subjective:     Patient ID: [name], male   DOB: 1932-07-10    MRN: [id number]  Brief patient profile:  40 yowm quit smoking 1970 with morbid obesity complicated by aodm, Lymphoma s/p chemo ([facility]) and tendency to peripheral edema that is felt to be dependent and related to obesity/ venous insuff.   History of Present Illness  09/05/2012 acute  ov/Aniyha Tate new problem = rash, breathing and cough resolved Chief Complaint  Patient presents with  . Acute Visit    Pt c/o rash and burning sensation left shoulder radiating down left arm- started to notice about a wk ago  initially though to be shingles but rash is actually bilateral upper arms and abd wall, itching rather than painful, no assoc fever or ha, arthralgias  >>refer to derm , vistaril rx      Admit date: 10/27/2012  Discharge date: 11/08/2012  Primary Care Physician: Christinia Gully, MD  Final Discharge Diagnoses:  Staph aureus bacteremia/sepsis of unclear etiology, likely with assumption that the pacemaker is contaminated  Secondary diagnosis . acute encephalopathy . Abdominal pain . Bacteremia due to Staphylococcus aureus . generalized debility  . Other pancytopenia . Hyponatremia . LYMPHOMA NEC, MLIG, INGUINAL/LOWER LIMB . Essential hypertension, benign . GERD . DM (diabetes mellitus), secondary uncontrolled       07/18/2014 f/u ov/[name] re: acute cough/ abrupt L cw pain/ positional/ f/u dm / obesity   Chief Complaint  Patient presents with  . Acute Visit    Pt c/o increased cough and chest congestion x 2 wks. Cough is non prod most of the time, but did produce min green sputum a few days ago. He is using rescue inhaler 2-3 x per day. He also c/o left side pain that started 4 days ago after bending over to pick something up. He feels like he may have pulled a muscle.    using lots of saba day > noct -  mdi not optimal (see a/p) Has not used any tramadol for cough though it is on the calendar   No obvious day  to day or daytime variabilty or assoc sob (though very sedentary)  or  chest tightness, subjective wheeze overt sinus or hb symptoms. No unusual exp hx or h/o childhood pna/ asthma or knowledge of premature birth.  Sleeping ok without nocturnal  or early am exacerbation  of respiratory  c/o's or need for noct saba. Also denies any obvious fluctuation of symptoms with weather or environmental changes or other aggravating or alleviating factors except as outlined above   Current Medications, Allergies, Complete Past Medical History, Past Surgical History, Family History, and Social History were reviewed in [employer] record.  ROS  The following are not active complaints unless bolded sore throat, dysphagia, dental problems, itching, sneezing,  nasal congestion or excess/ purulent secretions, ear ache,   fever, chills, sweats, unintended wt loss, pleuritic or exertional cp, hemoptysis,  orthopnea pnd or leg swelling, presyncope, palpitations, abdominal pain, anorexia, nausea, vomiting, diarrhea  or change in bowel or urinary habits, change in stools or urine, dysuria,hematuria,  rash, arthralgias, visual complaints, headache, numbness weakness or ataxia or problems with walking or coordination,  change in mood/affect or memory.              Past Medical History:   LYMPHOMA NEC, MLIG, INGUINAL/LOWER LIMB (ICD-202.85) dx 04/2005.......[facility]  - last chemo 10/2006 -repeat CT/ABD CT 11/18/07--no reccurence  - Pancytopenia August 06, 2009 > refer back to [facility] > aranesp  rx Jan 2012  RHINITIS, ALLERGIC NOS (ICD-477.9)  HYPERLIPIDEMIA NEC/NOS (ICD-272.4)  EXOGENOUS OBESITY (ICD-278.00)  - ideal body weight less than 186  AODM onset 2012 with freq pred for airways issues -HgA1C 5.5 12/12 >metformin decreased 500mg  1/2 Twice daily   Vitamin D Deficiency- level 29>>44 (4/9//10)  Left Hip pain onset around  6/09............................................................................   [facility]  - MRI 11/23/2007 c/w L1-2 bulging disc indents the thecal sac with foraminal stenosis  HEALTH MAINTENANCE..........................................................................................[facility]Wert  - Td 10/2005  - Pneumovax 10/07 second shot  - CPX 08/20/2010  --colon 02/2005 -int/extern. hems (repeat q7y)  Complex med regimen  --Meds reviewed with pt education and computerized med calendar completed/adjusted. November 08, 2008 , August 21, 2009 updated 02/19/2011 , 10/23/2011 , 09/19/2013  Syncope...........................................................................................................[facility] KitchenHochrein  - Mobitz II AV block s/p Medtronic pacemaker placement 12/11/2008  Dermat[facility].....................................................................................................[facility]  - Pruritic rash 04/2009 > tried lotrisone, referred to [facility] August 06, 2009  Memory impairment , MMSE 27/30 8/25>refer to neuro   Family History:   mother had diabetes   Social History:  Patient states former smoker.  quit smoking in 1970  No ETOH  Retired            Objective:   Physical Exam wt 244 January 16, 2008 > 249 April 24, 2009  >07/03/2011 246 >  12/18/2013 231 >227 02/20/2014 > 07/18/2014   242    amb obese wm in no acute distress / vital signs reviewed  HEENT: nl dentition and orophanx.  Nasal mucosa dry, Mucus membranes pink and moist. No tongue or throat exudate.  Neck: supple with full ROM. no JVD, node enlargement or TMJ   Lungs: clear and equal bilateral breath sounds. Min insp and exp rhonchi p used saba in exam room  Chest: RRR. No murmurs, rubs, or gallops.   Tr- peripheral edema.  Abd: obese but soft and no rebound or focal tenderness. Normal excursion in the supine position.  Ext warm and dry, no calf tenderness, cyanosis or clubbing. mild/mod chronic venous insufficiency  changes. Varicose veins present. Neuro: nl sensorium and gait.. Gets up from chair fine without hands, sym strength both legs  Skin:   No rash     CXR PA and Lateral:   07/18/2014 :     I personally reviewed images and agree with radiology impression as follows:    No active disease.  Labs ordered/ reviewed:   Lab 07/18/14 1408  NA 136  K 3.8  CL 99  CO2 29  BUN 14  CREATININE 0.85  GLUCOSE 142*      Lab 07/20/14 0913  HGB 9.7*  HCT 27.9*  WBC 1.3*  PLT 66*                     Assessment:

## 2014-07-18 NOTE — Patient Instructions (Addendum)
Please remember to go to the lab and x-ray department downstairs for your tests - we will call you with the results when they are available.  Work on inhaler technique:  relax and gently blow all the way out then take a nice smooth deep breath back in, triggering the inhaler at same time you start breathing in.  Hold for up to 5 seconds if you can.  Rinse and gargle with water when done   take stool softener every night     Augmentin 875 mg take one pill twice daily  X 10 days - take at breakfast and supper with large glass of water.  It would help reduce the usual side effects (diarrhea and yeast infections) if you ate cultured yogurt at lunch.   Prednisone 10 mg take  4 each am x 2 days,   2 each am x 2 days,  1 each am x 2 days and stop   See calendar for specific medication instructions and bring it back for each and every office visit for every healthcare provider you see.  Without it,  you may not receive the best quality medical care that we feel you deserve.  You will note that the calendar groups together  your maintenance  medications that are timed at particular times of the day.  Think of this as your checklist for what your doctor has instructed you to do until your next evaluation to see what benefit  there is  to staying on a consistent group of medications intended to keep you well.  The other group at the bottom is entirely up to you to use as you see fit  for specific symptoms that may arise between visits that require you to treat them on an as needed basis.  Think of this as your action plan or "what if" list.   Separating the top medications from the bottom group is fundamental to providing you adequate care going forward.    Please schedule a follow up visit in 3 months but call sooner if needed to see Tammy for new med calendar

## 2014-07-19 ENCOUNTER — Telehealth: Payer: Self-pay | Admitting: Internal Medicine

## 2014-07-19 NOTE — Telephone Encounter (Signed)
Result Note     Call patient : Studies are unremarkable, no change in recs   Result Note     Call pt: Reviewed cxr and no acute change so no change in recommendations made at [facility]  --- Daughter aware of results. Nothing further needed

## 2014-07-20 ENCOUNTER — Other Ambulatory Visit: Payer: Self-pay | Admitting: Oncology

## 2014-07-20 ENCOUNTER — Other Ambulatory Visit (HOSPITAL_BASED_OUTPATIENT_CLINIC_OR_DEPARTMENT_OTHER): Payer: Medicare Other

## 2014-07-20 ENCOUNTER — Ambulatory Visit (HOSPITAL_BASED_OUTPATIENT_CLINIC_OR_DEPARTMENT_OTHER): Payer: Medicare Other

## 2014-07-20 ENCOUNTER — Ambulatory Visit: Payer: Medicare Other

## 2014-07-20 ENCOUNTER — Other Ambulatory Visit: Payer: Medicare Other

## 2014-07-20 VITALS — BP 135/79 | HR 84 | Temp 98.1°F

## 2014-07-20 DIAGNOSIS — D469 Myelodysplastic syndrome, unspecified: Secondary | ICD-10-CM | POA: Diagnosis present

## 2014-07-20 DIAGNOSIS — D61818 Other pancytopenia: Secondary | ICD-10-CM | POA: Diagnosis not present

## 2014-07-20 DIAGNOSIS — D472 Monoclonal gammopathy: Secondary | ICD-10-CM | POA: Diagnosis not present

## 2014-07-20 DIAGNOSIS — N189 Chronic kidney disease, unspecified: Secondary | ICD-10-CM

## 2014-07-20 DIAGNOSIS — D709 Neutropenia, unspecified: Secondary | ICD-10-CM

## 2014-07-20 DIAGNOSIS — D631 Anemia in chronic kidney disease: Secondary | ICD-10-CM

## 2014-07-20 LAB — CBC WITH DIFFERENTIAL/PLATELET
BASO%: 0 % (ref 0.0–2.0)
Basophils Absolute: 0 10*3/uL (ref 0.0–0.1)
EOS ABS: 0 10*3/uL (ref 0.0–0.5)
EOS%: 0.7 % (ref 0.0–7.0)
HCT: 27.9 % — ABNORMAL LOW (ref 38.4–49.9)
HGB: 9.7 g/dL — ABNORMAL LOW (ref 13.0–17.1)
LYMPH#: 0.5 10*3/uL — AB (ref 0.9–3.3)
LYMPH%: 38.1 % (ref 14.0–49.0)
MCH: 35.5 pg — ABNORMAL HIGH (ref 27.2–33.4)
MCHC: 34.8 g/dL (ref 32.0–36.0)
MCV: 102.2 fL — ABNORMAL HIGH (ref 79.3–98.0)
MONO#: 0 10*3/uL — AB (ref 0.1–0.9)
MONO%: 0 % (ref 0.0–14.0)
NEUT%: 61.2 % (ref 39.0–75.0)
NEUTROS ABS: 0.8 10*3/uL — AB (ref 1.5–6.5)
PLATELETS: 66 10*3/uL — AB (ref 140–400)
RBC: 2.73 10*6/uL — AB (ref 4.20–5.82)
RDW: 20.8 % — ABNORMAL HIGH (ref 11.0–14.6)
WBC: 1.3 10*3/uL — AB (ref 4.0–10.3)

## 2014-07-20 LAB — HOLD TUBE, BLOOD BANK

## 2014-07-20 MED ORDER — DARBEPOETIN ALFA 300 MCG/0.6ML IJ SOSY
300.0000 ug | PREFILLED_SYRINGE | Freq: Once | INTRAMUSCULAR | Status: AC
  Administered 2014-07-20: 300 ug via SUBCUTANEOUS
  Filled 2014-07-20: qty 0.6

## 2014-07-22 ENCOUNTER — Encounter: Payer: Self-pay | Admitting: Internal Medicine

## 2014-07-22 NOTE — Assessment & Plan Note (Addendum)
CT sinus 06/20/10 >>   Neg - 12/18/2013 p extensive coaching HFA effectiveness =    90% > try reduce  dulera 100 2bid and added pepcid p supper > worse so back to 2002bid  Poor control x 2 weeks/ not using action plan on med calendar/ rx acute flare with augmentin in case there is underlying sinus dz and Prednisone 10 mg take  4 each am x 2 days,   2 each am x 2 days,  1 each am x 2 days and stop for any inflammatory or allergic component.   The proper method of use, as well as anticipated side effects, of a metered-dose inhaler are discussed and demonstrated to the patient. Improved effectiveness after extensive coaching during this visit to a level of approximately  75% so continue dulera 200 2bid  I had an extended discussion with the patient and daughter  reviewing all relevant studies completed to date and  lasting 15 to 20 minutes of a 25 minute visit    Each maintenance medication was reviewed in detail including most importantly the difference between maintenance and prns and under what circumstances the prns are to be triggered using an action plan format that is not reflected in the computer generated alphabetically organized AVS.    Please see instructions for details which were reviewed in writing and the patient given a copy highlighting the part that I personally wrote and discussed at today's ov.

## 2014-07-22 NOTE — Assessment & Plan Note (Signed)
Body mass index is 33.77 kg/(m^2).  Lab Results  Component Value Date   TSH 2.79 12/18/2013     Contributing to gerd tendency/ doe/ needs to achieve and maintain neg calorie balance , reviewed

## 2014-07-22 NOTE — Assessment & Plan Note (Signed)
-   New onset 2012 while on freq doses of pred for airways dz >>Nutritionist referral 06/20/2010  And started on metfomin 6/6/1  Lab Results  Component Value Date   HGBA1C 6.4 07/18/2014   HGBA1C 6.4 12/18/2013   HGBA1C 6.1 06/14/2013   Adequate control on present rx, reviewed > no change in rx needed

## 2014-07-26 ENCOUNTER — Telehealth: Payer: Self-pay | Admitting: *Deleted

## 2014-07-26 NOTE — Telephone Encounter (Signed)
Stephanie(dtr) called to make Dr. [name] aware that her Dad was in [place] on vacation and went to the ED there because he was weak.  She reports his hemoglobin was less than 8.  The doctor there "is getting ready to call Dr.Shadad" and she just wants him to be aware.  Let her know that I will get a message to Dr. [name].  Dtr's call back is [phone] if needed.

## 2014-07-26 NOTE — Telephone Encounter (Signed)
PT.'S DAUGHTER CALLED AGAIN. SHE WANTED TO BE EXPRESS HER CONCERN THAT HER FATHER IS USUALLY TRANSFUSED WHEN HIS HEMOGLOBIN IS UNDER NINE.

## 2014-07-27 ENCOUNTER — Ambulatory Visit (HOSPITAL_COMMUNITY)
Admission: RE | Admit: 2014-07-27 | Discharge: 2014-07-27 | Disposition: A | Discharge status: Home / Self Care | Payer: Medicare Other | Source: Ambulatory Visit | Attending: Oncology | Admitting: Oncology

## 2014-07-27 DIAGNOSIS — D508 Other iron deficiency anemias: Secondary | ICD-10-CM | POA: Insufficient documentation

## 2014-08-01 ENCOUNTER — Other Ambulatory Visit: Payer: Self-pay | Admitting: Oncology

## 2014-08-01 ENCOUNTER — Ambulatory Visit (HOSPITAL_BASED_OUTPATIENT_CLINIC_OR_DEPARTMENT_OTHER): Payer: Medicare Other | Admitting: Oncology

## 2014-08-01 ENCOUNTER — Other Ambulatory Visit (HOSPITAL_BASED_OUTPATIENT_CLINIC_OR_DEPARTMENT_OTHER): Payer: Medicare Other

## 2014-08-01 ENCOUNTER — Ambulatory Visit: Payer: Medicare Other | Admitting: Oncology

## 2014-08-01 ENCOUNTER — Telehealth: Payer: Self-pay | Admitting: Oncology

## 2014-08-01 ENCOUNTER — Ambulatory Visit: Payer: Medicare Other

## 2014-08-01 ENCOUNTER — Other Ambulatory Visit: Payer: Medicare Other

## 2014-08-01 ENCOUNTER — Ambulatory Visit (HOSPITAL_BASED_OUTPATIENT_CLINIC_OR_DEPARTMENT_OTHER): Payer: Medicare Other

## 2014-08-01 VITALS — BP 118/64 | HR 70 | Temp 98.6°F | Resp 18

## 2014-08-01 VITALS — BP 147/75 | HR 113 | Temp 98.1°F | Resp 18 | Ht 71.0 in | Wt 241.5 lb

## 2014-08-01 DIAGNOSIS — D469 Myelodysplastic syndrome, unspecified: Secondary | ICD-10-CM

## 2014-08-01 DIAGNOSIS — D61818 Other pancytopenia: Secondary | ICD-10-CM

## 2014-08-01 DIAGNOSIS — D508 Other iron deficiency anemias: Secondary | ICD-10-CM

## 2014-08-01 DIAGNOSIS — D472 Monoclonal gammopathy: Secondary | ICD-10-CM | POA: Diagnosis not present

## 2014-08-01 DIAGNOSIS — D631 Anemia in chronic kidney disease: Secondary | ICD-10-CM

## 2014-08-01 DIAGNOSIS — N189 Chronic kidney disease, unspecified: Secondary | ICD-10-CM

## 2014-08-01 LAB — CBC WITH DIFFERENTIAL/PLATELET
BASO%: 0.3 % (ref 0.0–2.0)
Basophils Absolute: 0 10*3/uL (ref 0.0–0.1)
EOS ABS: 0 10*3/uL (ref 0.0–0.5)
EOS%: 0.7 % (ref 0.0–7.0)
HEMATOCRIT: 24 % — AB (ref 38.4–49.9)
HGB: 8.1 g/dL — ABNORMAL LOW (ref 13.0–17.1)
LYMPH%: 55.4 % — ABNORMAL HIGH (ref 14.0–49.0)
MCH: 35.5 pg — AB (ref 27.2–33.4)
MCHC: 33.8 g/dL (ref 32.0–36.0)
MCV: 105.2 fL — AB (ref 79.3–98.0)
MONO#: 0 10*3/uL — ABNORMAL LOW (ref 0.1–0.9)
MONO%: 0.6 % (ref 0.0–14.0)
NEUT#: 0.5 10*3/uL — CL (ref 1.5–6.5)
NEUT%: 43 % (ref 39.0–75.0)
Platelets: 82 10*3/uL — ABNORMAL LOW (ref 140–400)
RBC: 2.29 10*6/uL — ABNORMAL LOW (ref 4.20–5.82)
RDW: 23.5 % — ABNORMAL HIGH (ref 11.0–14.6)
WBC: 1.1 10*3/uL — ABNORMAL LOW (ref 4.0–10.3)
lymph#: 0.6 10*3/uL — ABNORMAL LOW (ref 0.9–3.3)

## 2014-08-01 LAB — PREPARE RBC (CROSSMATCH)

## 2014-08-01 LAB — HOLD TUBE, BLOOD BANK

## 2014-08-01 MED ORDER — DARBEPOETIN ALFA 300 MCG/0.6ML IJ SOSY
300.0000 ug | PREFILLED_SYRINGE | Freq: Once | INTRAMUSCULAR | Status: AC
  Administered 2014-08-01: 300 ug via SUBCUTANEOUS
  Filled 2014-08-01: qty 0.6

## 2014-08-01 MED ORDER — DIPHENHYDRAMINE HCL 25 MG PO CAPS
ORAL_CAPSULE | ORAL | Status: AC
Start: 2014-08-01 — End: 2014-08-01
  Filled 2014-08-01: qty 1

## 2014-08-01 MED ORDER — ACETAMINOPHEN 325 MG PO TABS
650.0000 mg | ORAL_TABLET | Freq: Once | ORAL | Status: AC
  Administered 2014-08-01: 650 mg via ORAL

## 2014-08-01 MED ORDER — DIPHENHYDRAMINE HCL 25 MG PO CAPS
25.0000 mg | ORAL_CAPSULE | Freq: Once | ORAL | Status: AC
  Administered 2014-08-01: 25 mg via ORAL

## 2014-08-01 MED ORDER — ACETAMINOPHEN 325 MG PO TABS
ORAL_TABLET | ORAL | Status: AC
  Filled 2014-08-01: qty 2

## 2014-08-01 MED ORDER — SODIUM CHLORIDE 0.9 % IV SOLN
250.0000 mL | Freq: Once | INTRAVENOUS | Status: AC
  Administered 2014-08-01: 250 mL via INTRAVENOUS

## 2014-08-01 MED ORDER — FUROSEMIDE 10 MG/ML IJ SOLN
20.0000 mg | Freq: Once | INTRAMUSCULAR | Status: AC
  Administered 2014-08-01: 20 mg via INTRAVENOUS

## 2014-08-01 NOTE — Patient Instructions (Signed)

## 2014-08-01 NOTE — Progress Notes (Signed)
Hematology and Oncology Follow Up Visit  [name] [id number] [birth year] 81 y.o. 08/01/2014 9:02 AM  CC: [name]. Victor Novas, MD, FCCP    Principle Diagnosis: A 81 year old gentleman with the following diagnoses:   1. Follicular lymphoma, grade 3, stage III, diagnosed 2007. Continued to be in remission. 2. Pancytopenia associated with mild neutropenia and macrocytosis possibly indicating early myelodysplasia. Bone marrow  was repeated on 06/22/2012 confirmed the evidence of myelodysplastic syndrome 3. Monoclonal gammopathy of undetermined significance.  Prior Therapy:  1. Status post CHOP and rituximab.  He had complete response to therapy concluded in 2007.  No further imaging has been indicated at this time. 2. Status post bone marrow biopsy in April 2011, did not really show any evidence of myelodysplasia or metastatic lymphoma at this time.  Current therapy: Aranesp 300 mcg every 2weeks to keep his hemoglobin close to 11. He also has been receiving intermittent PRBC transfusions if his hemoglobin below 9.  Interim History: [name] presents today for a followup visit with his daughter. Since his last visit, he reports no dramatic changes in his health status. [place] have to go to a local emergency department and received 1 unit of packed red cell transfusions. I have discussed his case with the treating physician in the emergency department and upon my recommendation received 1 unit of packed red cell transfusion. After that transfusion his symptoms improved and is able to enjoy his vacation better. Since his return, his sister who passed away and was planning to attend her funeral today. He is more lethargic however and reporting some fatigue and occasional dizziness. He is not reporting any chest pain or dyspnea on exertion today.  He does spend the majority of his day in his house does go out occasionally and drives short distances. He has not  reported any recurrent sinopulmonary infections  He has not noticed any further decline in his energy her performance status. No further bleeding episodes noted. He does not report any dysphagia.  He does not report any lymphadenopathy. Denies night sweats. He reports no syncope or falls. He uses a cane to ambulate at this time. He did not report any headaches or change in his vision. Does not report any seizures or confusion. He does not report any palpitation or orthopnea. Has not reported any edema. Does not report any wheezing but still have exertional dyspnea. Is not reporting any abdominal pain but his appetite had been down slightly,however his weight has remained relatively stable recently.  He does not report any lymphadenopathy or petechiae. Has not reported any skin rashes or genitourinary complaints. Remainder of review of systems unremarkable.   Medications: I have reviewed the patient's current medications Current Outpatient Prescriptions  Medication Sig Dispense Refill  . acetaminophen (TYLENOL) 500 MG tablet Take 1-2 every tablets by mouth every 4-6 hours as needed for headache    . albuterol (PROVENTIL HFA;VENTOLIN HFA) 108 (90 BASE) MCG/ACT inhaler Inhale 1-2 puffs into the lungs every 4 (four) hours as needed for wheezing or shortness of breath. 8 g 5  . amoxicillin-clavulanate (AUGMENTIN) 875-125 MG per tablet Take 1 tablet by mouth 2 (two) times daily. 20 tablet 0  . aspirin 81 MG tablet Take 81 mg by mouth every morning. (hold if bleeding)    . calcium-vitamin D (OS-CAL 500 + D) 500-200 MG-UNIT per tablet Take 1 tablet by mouth 2 (two) times daily.     . cetirizine (ZYRTEC)  10 MG tablet Take 10 mg by mouth at bedtime as needed for allergies.    . Cholecalciferol (VITAMIN D) 1000 UNITS capsule Take 1,000 Units by mouth 2 (two) times daily.      [name] dextromethorphan-guaiFENesin (MUCINEX DM) 30-600 MG per 12 hr tablet Take 1-2 tablets by mouth every 12 (twelve) hours as needed for  cough.     . docusate sodium (COLACE) 100 MG capsule Take 100 mg by mouth at bedtime.    . famotidine (PEPCID) 20 MG tablet Take 20 mg by mouth at bedtime.     . finasteride (PROSCAR) 5 MG tablet Take 5 mg by mouth every morning.     . furosemide (LASIX) 40 MG tablet TAKE 1 TABLET DAILY. MAY TAKE 1 EXTRA TABLET DAILY IF NEEDED FOR SWELLING IN LEGS 30 tablet 5  . hydrocortisone cream 1 % Apply 1 application topically 2 (two) times daily as needed (itchy rash).     . hydrOXYzine (ATARAX/VISTARIL) 25 MG tablet Take 1-2 tablets by mouth every 4-6 hours as needed for itching    . KLOR-CON M20 20 MEQ tablet TAKE 1 TABLET BY MOUTH EVERY DAY 30 tablet 5  . levothyroxine (SYNTHROID, LEVOTHROID) 25 MCG tablet TAKE 1 TABLET BY MOUTH EVERY MORNING BEFORE BREAKFAST 30 tablet 3  . metFORMIN (GLUCOPHAGE) 500 MG tablet TAKE 1 TABLET BY MOUTH TWICE A DAY WITH FOOD 60 tablet 5  . methylcellulose (CITRUCEL) oral powder Take 1 packet by mouth at bedtime.    . mometasone (NASONEX) 50 MCG/ACT nasal spray Place 0-2 sprays into the nose at bedtime. Takes at bedtime only as needed for allergies    . mometasone-formoterol (DULERA) 200-5 MCG/ACT AERO Inhale 2 puffs into the lungs 2 (two) times daily.    . Multiple Vitamin (MULTIVITAMIN) tablet Take 1 tablet by mouth daily.      . nitroGLYCERIN (NITROSTAT) 0.4 MG SL tablet Place 1 tablet (0.4 mg total) under the tongue every 5 (five) minutes as needed for chest pain. 90 tablet 3  . omeprazole (PRILOSEC) 20 MG capsule Take 40 mg by mouth every morning.     [name] oxybutynin (DITROPAN-XL) 10 MG 24 hr tablet Take 10 mg by mouth daily.    . polyethylene glycol powder (MIRALAX) powder Take 17 g by mouth daily.     . predniSONE (DELTASONE) 10 MG tablet Take  4 each am x 2 days,   2 each am x 2 days,  1 each am x 2 days and stop 14 tablet 0  . sulindac (CLINORIL) 200 MG tablet Take 200 mg by mouth 2 (two) times daily as needed (pain).     . traMADol (ULTRAM) 50 MG tablet Take 1/2 to  one  tablet by mouth every 4 hours as needed for severe pain or cough 40 tablet 0  . traZODone (DESYREL) 150 MG tablet TAKE 1/3 TABLET BY MOUTH AT BEDTIME 30 tablet 4   No current facility-administered medications for this visit.     Allergies: No Known Allergies  Past Medical History, Surgical history, Social history, and Family History were reviewed and updated.    Physical Exam: Blood pressure 147/75, pulse 113, temperature 98.1 F (36.7 C), temperature source Oral, resp. rate 18, height 5' 11"  (1.803 m), weight 241 lb 8 oz (109.544 kg), SpO2 95 %. ECOG: 1 General appearance: alert, pale not in any distress. Head: Normocephalic, without obvious abnormality Neck: no adenopathy. No palpable masses. Lymph nodes: Cervical, supraclavicular, and axillary nodes normal. Heart:regular rate and rhythm,  S1, S2 normal, no murmur, click, rub or gallop Lung:chest clear, no wheezing, rales, normal symmetric air entry. Abdomen: soft, non-tender, without masses or organomegaly. No ascites or shifting dullness. No rebound or guarding. EXT:no erythema, induration, or nodules. Trace edema noted. Skin: No rashes or lesions noted.    Lab Results: Lab Results  Component Value Date   WBC 1.1* 08/01/2014   HGB 8.1* 08/01/2014   HCT 24.0* 08/01/2014   MCV 105.2* 08/01/2014   PLT 82* 08/01/2014     Chemistry      Component Value Date/Time   NA 136 07/18/2014 1408   NA 137 01/05/2013 1501   K 3.8 07/18/2014 1408   K 3.8 01/05/2013 1501   CL 99 07/18/2014 1408   CL 99 06/08/2012 1257   CO2 29 07/18/2014 1408   CO2 26 01/05/2013 1501   BUN 14 07/18/2014 1408   BUN 13.9 01/05/2013 1501   CREATININE 0.85 07/18/2014 1408   CREATININE 0.8 01/05/2013 1501      Component Value Date/Time   CALCIUM 9.7 07/18/2014 1408   CALCIUM 10.1 01/05/2013 1501   ALKPHOS 82 12/06/2012 1850   ALKPHOS 48 08/10/2012 1117   AST 17 12/06/2012 1850   AST 17 08/10/2012 1117   ALT <5 12/06/2012 1850   ALT 14  08/10/2012 1117   BILITOT 0.7 12/06/2012 1850   BILITOT 0.82 08/10/2012 1117      Impression and Plan:  This is a 81 year old gentleman with the following issues:  1. Follicular lymphoma diagnosed in 2007. He has no evidence to suggest recurrent disease. 2. Pancytopenia due to myelodysplastic syndrome. He is status post bone marrow biopsy was done on 06/22/2012.  He is to continue on Aranesp 300 mcg every 2 weeks as well as intermittent transfusions. His hemoglobin is 8.1 and he is symptomatic. I gave him the option to give him packed red cell transfusions today versus defer to 24 or 48 hours from now to enable him to attend his sister funeral. I feel he would benefit from the blood sooner than later and I recommended that he receives it today. He now is that fact and elected to receive it today and will catch up with his family at a later date. He will continue the Aranesp 300 g every 2 weeks as well. 3. Neutropenia: Neutropenic precautions were given to the patient today. See no need for any growth factor support since he has not had any clinical infections.  4. Monoclonal gammopathy.  His M-spike had been less than 1 g/dL, his bone marrow biopsy continued to show a less than 8% plasma cells indicating most likely multiple myeloma.  This will be repeated in the future to ensure stability. 5. Followup every 2 weeks for an injection and possible transfusion. He will have a clinical visit in 8 weeks.  [facility], MD 7/6/20169:02 AM

## 2014-08-01 NOTE — Telephone Encounter (Signed)
Gave and printed appt sched and avs for pt for July thru [facility]

## 2014-08-01 NOTE — Progress Notes (Signed)
Aranesp injection given by infusion nurse. 

## 2014-08-02 LAB — TYPE AND SCREEN
ABO/RH(D): A POS
Antibody Screen: NEGATIVE
UNIT DIVISION: 0
UNIT DIVISION: 0

## 2014-08-03 ENCOUNTER — Other Ambulatory Visit: Payer: Medicare Other

## 2014-08-03 ENCOUNTER — Ambulatory Visit: Payer: Medicare Other

## 2014-08-03 ENCOUNTER — Ambulatory Visit: Payer: Medicare Other | Admitting: Nurse Practitioner

## 2014-08-06 ENCOUNTER — Other Ambulatory Visit: Payer: Medicare Other

## 2014-08-06 ENCOUNTER — Ambulatory Visit: Payer: Medicare Other | Admitting: Oncology

## 2014-08-06 ENCOUNTER — Ambulatory Visit: Payer: Medicare Other | Admitting: Physician Assistant

## 2014-08-06 ENCOUNTER — Ambulatory Visit: Payer: Medicare Other

## 2014-08-14 ENCOUNTER — Other Ambulatory Visit: Payer: Self-pay | Admitting: Internal Medicine

## 2014-08-14 NOTE — Telephone Encounter (Signed)
Dr. [name] please advise on this rx refill.  Thanks!

## 2014-08-15 ENCOUNTER — Ambulatory Visit (HOSPITAL_BASED_OUTPATIENT_CLINIC_OR_DEPARTMENT_OTHER): Payer: Medicare Other

## 2014-08-15 ENCOUNTER — Other Ambulatory Visit (HOSPITAL_BASED_OUTPATIENT_CLINIC_OR_DEPARTMENT_OTHER): Payer: Medicare Other

## 2014-08-15 VITALS — BP 140/72 | HR 95 | Temp 98.3°F

## 2014-08-15 DIAGNOSIS — D469 Myelodysplastic syndrome, unspecified: Secondary | ICD-10-CM

## 2014-08-15 DIAGNOSIS — D61818 Other pancytopenia: Secondary | ICD-10-CM

## 2014-08-15 DIAGNOSIS — D631 Anemia in chronic kidney disease: Secondary | ICD-10-CM

## 2014-08-15 DIAGNOSIS — N189 Chronic kidney disease, unspecified: Secondary | ICD-10-CM

## 2014-08-15 LAB — CBC WITH DIFFERENTIAL/PLATELET
BASO%: 0 % (ref 0.0–2.0)
Basophils Absolute: 0 10*3/uL (ref 0.0–0.1)
EOS ABS: 0 10*3/uL (ref 0.0–0.5)
EOS%: 0.8 % (ref 0.0–7.0)
HCT: 29.8 % — ABNORMAL LOW (ref 38.4–49.9)
HEMOGLOBIN: 10.4 g/dL — AB (ref 13.0–17.1)
LYMPH%: 71.9 % — ABNORMAL HIGH (ref 14.0–49.0)
MCH: 34.9 pg — ABNORMAL HIGH (ref 27.2–33.4)
MCHC: 34.9 g/dL (ref 32.0–36.0)
MCV: 100 fL — ABNORMAL HIGH (ref 79.3–98.0)
MONO#: 0 10*3/uL — ABNORMAL LOW (ref 0.1–0.9)
MONO%: 0.8 % (ref 0.0–14.0)
NEUT%: 26.5 % — AB (ref 39.0–75.0)
NEUTROS ABS: 0.3 10*3/uL — AB (ref 1.5–6.5)
Platelets: 77 10*3/uL — ABNORMAL LOW (ref 140–400)
RBC: 2.98 10*6/uL — AB (ref 4.20–5.82)
RDW: 19.2 % — AB (ref 11.0–14.6)
WBC: 1.3 10*3/uL — ABNORMAL LOW (ref 4.0–10.3)
lymph#: 0.9 10*3/uL (ref 0.9–3.3)

## 2014-08-15 LAB — HOLD TUBE, BLOOD BANK

## 2014-08-15 MED ORDER — DARBEPOETIN ALFA 300 MCG/0.6ML IJ SOSY
300.0000 ug | PREFILLED_SYRINGE | Freq: Once | INTRAMUSCULAR | Status: AC
  Administered 2014-08-15: 300 ug via SUBCUTANEOUS
  Filled 2014-08-15: qty 0.6

## 2014-08-15 NOTE — Progress Notes (Signed)
HGB 10.4 today.  Doesn't need blood transfusion today.

## 2014-08-17 ENCOUNTER — Telehealth: Payer: Self-pay | Admitting: Internal Medicine

## 2014-08-17 MED ORDER — SULINDAC 200 MG PO TABS: 200.0000 mg | ORAL_TABLET | Freq: Two times a day (BID) | ORAL | Status: DC | PRN

## 2014-08-17 NOTE — Telephone Encounter (Signed)
Ok but we didn't have it on avs so will need ov with Tammy and all meds before next refill to be sure there are no other errors (one refill only)

## 2014-08-17 NOTE — Telephone Encounter (Signed)
Called in Sulindac Called and advised patient that Rx has been called in for 1 refill and that patient needs appointment with TP to go over medications. Patient says he will have his daughter call us back to schedule the appointment because she handles all of that for him because she has to bring him to his appointments.  Nothing further needed.

## 2014-08-17 NOTE — Telephone Encounter (Signed)
Daughter calling requesting refill on Sulindac.  Patient has been taking 2 times daily due to arthritis pain in his hip. Daughter says that pharmacy sent request for refill to our office and it had been denied. She wants to know if Dr. [name] would be willing to refill this.  Dr. [name], Please advise if Ok to refill Sulindac.

## 2014-08-21 ENCOUNTER — Other Ambulatory Visit: Payer: Self-pay | Admitting: Internal Medicine

## 2014-08-21 MED ORDER — SULINDAC 200 MG PO TABS: 200.0000 mg | ORAL_TABLET | Freq: Two times a day (BID) | ORAL | Status: DC | PRN

## 2014-08-27 ENCOUNTER — Ambulatory Visit (HOSPITAL_COMMUNITY)
Admission: RE | Admit: 2014-08-27 | Discharge: 2014-08-27 | Disposition: A | Discharge status: Home / Self Care | Payer: Medicare Other | Source: Ambulatory Visit | Attending: Oncology | Admitting: Oncology

## 2014-08-27 DIAGNOSIS — D649 Anemia, unspecified: Secondary | ICD-10-CM

## 2014-08-29 ENCOUNTER — Ambulatory Visit (HOSPITAL_BASED_OUTPATIENT_CLINIC_OR_DEPARTMENT_OTHER): Payer: Medicare Other

## 2014-08-29 ENCOUNTER — Ambulatory Visit: Payer: Medicare Other

## 2014-08-29 ENCOUNTER — Other Ambulatory Visit (HOSPITAL_BASED_OUTPATIENT_CLINIC_OR_DEPARTMENT_OTHER): Payer: Medicare Other

## 2014-08-29 VITALS — BP 129/73 | HR 94 | Temp 97.8°F | Resp 20

## 2014-08-29 DIAGNOSIS — D469 Myelodysplastic syndrome, unspecified: Secondary | ICD-10-CM

## 2014-08-29 DIAGNOSIS — D631 Anemia in chronic kidney disease: Secondary | ICD-10-CM

## 2014-08-29 DIAGNOSIS — D472 Monoclonal gammopathy: Secondary | ICD-10-CM

## 2014-08-29 DIAGNOSIS — D61818 Other pancytopenia: Secondary | ICD-10-CM

## 2014-08-29 DIAGNOSIS — D709 Neutropenia, unspecified: Secondary | ICD-10-CM | POA: Diagnosis not present

## 2014-08-29 DIAGNOSIS — N189 Chronic kidney disease, unspecified: Secondary | ICD-10-CM

## 2014-08-29 LAB — CBC WITH DIFFERENTIAL/PLATELET
BASO%: 0 % (ref 0.0–2.0)
Basophils Absolute: 0 10*3/uL (ref 0.0–0.1)
EOS%: 0.8 % (ref 0.0–7.0)
Eosinophils Absolute: 0 10*3/uL (ref 0.0–0.5)
HEMATOCRIT: 28.7 % — AB (ref 38.4–49.9)
HGB: 9.9 g/dL — ABNORMAL LOW (ref 13.0–17.1)
LYMPH%: 62.8 % — ABNORMAL HIGH (ref 14.0–49.0)
MCH: 35.4 pg — ABNORMAL HIGH (ref 27.2–33.4)
MCHC: 34.5 g/dL (ref 32.0–36.0)
MCV: 102.5 fL — AB (ref 79.3–98.0)
MONO#: 0 10*3/uL — ABNORMAL LOW (ref 0.1–0.9)
MONO%: 0.8 % (ref 0.0–14.0)
NEUT%: 35.6 % — ABNORMAL LOW (ref 39.0–75.0)
NEUTROS ABS: 0.4 10*3/uL — AB (ref 1.5–6.5)
Platelets: 71 10*3/uL — ABNORMAL LOW (ref 140–400)
RBC: 2.8 10*6/uL — AB (ref 4.20–5.82)
RDW: 20.1 % — AB (ref 11.0–14.6)
WBC: 1.2 10*3/uL — ABNORMAL LOW (ref 4.0–10.3)
lymph#: 0.8 10*3/uL — ABNORMAL LOW (ref 0.9–3.3)

## 2014-08-29 LAB — HOLD TUBE, BLOOD BANK

## 2014-08-29 MED ORDER — DARBEPOETIN ALFA 300 MCG/0.6ML IJ SOSY
300.0000 ug | PREFILLED_SYRINGE | Freq: Once | INTRAMUSCULAR | Status: AC
  Administered 2014-08-29: 300 ug via SUBCUTANEOUS
  Filled 2014-08-29: qty 0.6

## 2014-08-29 NOTE — Progress Notes (Signed)
Hgb 9.9 today; MD informed that patient is symptomatic with SOB and increased fatigue. No blood ordered today per Dr. [name]. Aranesp given per protocol. Patient and daughter informed to call [facility] with any changes or worsening symptoms. They verbalize understanding.

## 2014-08-29 NOTE — Patient Instructions (Signed)
Darbepoetin Alfa injection What is this medicine? DARBEPOETIN ALFA (dar be POE e tin AL fa) helps your body make more red blood cells. It is used to treat anemia caused by chronic kidney failure and chemotherapy. This medicine may be used for other purposes; ask your health care provider or pharmacist if you have questions. COMMON BRAND NAME(S): Aranesp What should I tell my health care provider before I take this medicine? They need to know if you have any of these conditions: -blood clotting disorders or history of blood clots -cancer patient not on chemotherapy -cystic fibrosis -heart disease, such as angina, heart failure, or a history of a heart attack -hemoglobin level of 12 g/dL or greater -high blood pressure -low levels of folate, iron, or vitamin B12 -seizures -an unusual or allergic reaction to darbepoetin, erythropoietin, albumin, hamster proteins, latex, other medicines, foods, dyes, or preservatives -pregnant or trying to get pregnant -breast-feeding How should I use this medicine? This medicine is for injection into a vein or under the skin. It is usually given by a health care professional in a hospital or clinic setting. If you get this medicine at home, you will be taught how to prepare and give this medicine. Do not shake the solution before you withdraw a dose. Use exactly as directed. Take your medicine at regular intervals. Do not take your medicine more often than directed. It is important that you put your used needles and syringes in a special sharps container. Do not put them in a trash can. If you do not have a sharps container, call your pharmacist or healthcare provider to get one. Talk to your pediatrician regarding the use of this medicine in children. While this medicine may be used in children as young as 1 year for selected conditions, precautions do apply. Overdosage: If you think you have taken too much of this medicine contact a poison control center or  emergency room at once. NOTE: This medicine is only for you. Do not share this medicine with others. What if I miss a dose? If you miss a dose, take it as soon as you can. If it is almost time for your next dose, take only that dose. Do not take double or extra doses. What may interact with this medicine? Do not take this medicine with any of the following medications: -epoetin alfa This list may not describe all possible interactions. Give your health care provider a list of all the medicines, herbs, non-prescription drugs, or dietary supplements you use. Also tell them if you smoke, drink alcohol, or use illegal drugs. Some items may interact with your medicine. What should I watch for while using this medicine? Visit your prescriber or health care professional for regular checks on your progress and for the needed blood tests and blood pressure measurements. It is especially important for the doctor to make sure your hemoglobin level is in the desired range, to limit the risk of potential side effects and to give you the best benefit. Keep all appointments for any recommended tests. Check your blood pressure as directed. Ask your doctor what your blood pressure should be and when you should contact him or her. As your body makes more red blood cells, you may need to take iron, folic acid, or vitamin B supplements. Ask your doctor or health care provider which products are right for you. If you have kidney disease continue dietary restrictions, even though this medication can make you feel better. Talk with your doctor or health   care professional about the foods you eat and the vitamins that you take. What side effects may I notice from receiving this medicine? Side effects that you should report to your doctor or health care professional as soon as possible: -allergic reactions like skin rash, itching or hives, swelling of the face, lips, or tongue -breathing problems -changes in vision -chest  pain -confusion, trouble speaking or understanding -feeling faint or lightheaded, falls -high blood pressure -muscle aches or pains -pain, swelling, warmth in the leg -rapid weight gain -severe headaches -sudden numbness or weakness of the face, arm or leg -trouble walking, dizziness, loss of balance or coordination -seizures (convulsions) -swelling of the ankles, feet, hands -unusually weak or tired Side effects that usually do not require medical attention (report to your doctor or health care professional if they continue or are bothersome): -diarrhea -fever, chills (flu-like symptoms) -headaches -nausea, vomiting -redness, stinging, or swelling at site where injected This list may not describe all possible side effects. Call your doctor for medical advice about side effects. You may report side effects to FDA at 1-800-FDA-1088. Where should I keep my medicine? Keep out of the reach of children. Store in a refrigerator between 2 and 8 degrees C (36 and 46 degrees F). Do not freeze. Do not shake. Throw away any unused portion if using a single-dose vial. Throw away any unused medicine after the expiration date. NOTE: This sheet is a summary. It may not cover all possible information. If you have questions about this medicine, talk to your doctor, pharmacist, or health care provider.  2015, [employer]vier/[employer] Standard. (2007-12-27 10:23:57)  

## 2014-09-03 ENCOUNTER — Ambulatory Visit (INDEPENDENT_AMBULATORY_CARE_PROVIDER_SITE_OTHER): Payer: Medicare Other | Admitting: *Deleted

## 2014-09-03 ENCOUNTER — Telehealth: Payer: Self-pay | Admitting: Cardiology

## 2014-09-03 DIAGNOSIS — I442 Atrioventricular block, complete: Secondary | ICD-10-CM

## 2014-09-03 NOTE — Telephone Encounter (Signed)
LMOVM reminding pt to send remote transmission.   

## 2014-09-04 NOTE — Progress Notes (Signed)
Remote pacemaker transmission.   

## 2014-09-12 ENCOUNTER — Ambulatory Visit (HOSPITAL_BASED_OUTPATIENT_CLINIC_OR_DEPARTMENT_OTHER): Payer: Medicare Other

## 2014-09-12 ENCOUNTER — Other Ambulatory Visit: Payer: Medicare Other

## 2014-09-12 ENCOUNTER — Ambulatory Visit (HOSPITAL_COMMUNITY)
Admission: RE | Admit: 2014-09-12 | Discharge: 2014-09-12 | Disposition: A | Discharge status: Home / Self Care | Payer: Medicare Other | Source: Ambulatory Visit | Attending: Oncology | Admitting: Oncology

## 2014-09-12 ENCOUNTER — Other Ambulatory Visit: Payer: Self-pay | Admitting: *Deleted

## 2014-09-12 VITALS — BP 114/64 | HR 75 | Temp 98.1°F | Resp 20

## 2014-09-12 DIAGNOSIS — D61818 Other pancytopenia: Secondary | ICD-10-CM

## 2014-09-12 DIAGNOSIS — N189 Chronic kidney disease, unspecified: Secondary | ICD-10-CM

## 2014-09-12 DIAGNOSIS — C8590 Non-Hodgkin lymphoma, unspecified, unspecified site: Secondary | ICD-10-CM

## 2014-09-12 DIAGNOSIS — D469 Myelodysplastic syndrome, unspecified: Secondary | ICD-10-CM | POA: Diagnosis present

## 2014-09-12 DIAGNOSIS — D631 Anemia in chronic kidney disease: Secondary | ICD-10-CM | POA: Diagnosis not present

## 2014-09-12 DIAGNOSIS — D649 Anemia, unspecified: Secondary | ICD-10-CM

## 2014-09-12 DIAGNOSIS — D508 Other iron deficiency anemias: Secondary | ICD-10-CM

## 2014-09-12 LAB — CBC WITH DIFFERENTIAL/PLATELET
BASO%: 0 % (ref 0.0–2.0)
BASOS ABS: 0 10*3/uL (ref 0.0–0.1)
EOS ABS: 0 10*3/uL (ref 0.0–0.5)
EOS%: 0 % (ref 0.0–7.0)
HEMATOCRIT: 24.4 % — AB (ref 38.4–49.9)
HGB: 8.4 g/dL — ABNORMAL LOW (ref 13.0–17.1)
LYMPH%: 68.2 % — AB (ref 14.0–49.0)
MCH: 36.2 pg — AB (ref 27.2–33.4)
MCHC: 34.4 g/dL (ref 32.0–36.0)
MCV: 105.2 fL — AB (ref 79.3–98.0)
MONO#: 0 10*3/uL — ABNORMAL LOW (ref 0.1–0.9)
MONO%: 0.9 % (ref 0.0–14.0)
NEUT#: 0.3 10*3/uL — CL (ref 1.5–6.5)
NEUT%: 30.9 % — AB (ref 39.0–75.0)
PLATELETS: 67 10*3/uL — AB (ref 140–400)
RBC: 2.32 10*6/uL — ABNORMAL LOW (ref 4.20–5.82)
RDW: 21.2 % — ABNORMAL HIGH (ref 11.0–14.6)
WBC: 1.1 10*3/uL — ABNORMAL LOW (ref 4.0–10.3)
lymph#: 0.7 10*3/uL — ABNORMAL LOW (ref 0.9–3.3)
nRBC: 3 % — ABNORMAL HIGH (ref 0–0)

## 2014-09-12 LAB — CUP PACEART REMOTE DEVICE CHECK
Battery Remaining Longevity: 118 mo
Brady Statistic AP VS Percent: 0 %
Brady Statistic AS VS Percent: 0 %
Lead Channel Impedance Value: 415 Ohm
Lead Channel Pacing Threshold Amplitude: 0.625 V
Lead Channel Pacing Threshold Pulse Width: 0.4 ms
Lead Channel Pacing Threshold Pulse Width: 0.4 ms
Lead Channel Setting Pacing Amplitude: 2 V
MDC IDC MSMT BATTERY IMPEDANCE: 133 Ohm
MDC IDC MSMT BATTERY VOLTAGE: 2.78 V
MDC IDC MSMT LEADCHNL RA IMPEDANCE VALUE: 466 Ohm
MDC IDC MSMT LEADCHNL RA PACING THRESHOLD AMPLITUDE: 0.75 V
MDC IDC MSMT LEADCHNL RA SENSING INTR AMPL: 2.8 mV
MDC IDC SESS DTM: 20160808202637
MDC IDC SET LEADCHNL RV PACING AMPLITUDE: 2.5 V
MDC IDC SET LEADCHNL RV PACING PULSEWIDTH: 0.4 ms
MDC IDC SET LEADCHNL RV SENSING SENSITIVITY: 4 mV
MDC IDC STAT BRADY AP VP PERCENT: 1 %
MDC IDC STAT BRADY AS VP PERCENT: 99 %

## 2014-09-12 LAB — HOLD TUBE, BLOOD BANK

## 2014-09-12 LAB — PREPARE RBC (CROSSMATCH)

## 2014-09-12 MED ORDER — DIPHENHYDRAMINE HCL 25 MG PO CAPS
ORAL_CAPSULE | ORAL | Status: AC
  Filled 2014-09-12: qty 1

## 2014-09-12 MED ORDER — ACETAMINOPHEN 325 MG PO TABS
ORAL_TABLET | ORAL | Status: AC
Start: 2014-09-12 — End: 2014-09-12
  Filled 2014-09-12: qty 2

## 2014-09-12 MED ORDER — SODIUM CHLORIDE 0.9 % IJ SOLN
10.0000 mL | INTRAMUSCULAR | Status: DC | PRN
  Filled 2014-09-12: qty 10

## 2014-09-12 MED ORDER — ACETAMINOPHEN 325 MG PO TABS
650.0000 mg | ORAL_TABLET | Freq: Once | ORAL | Status: AC
  Administered 2014-09-12: 650 mg via ORAL

## 2014-09-12 MED ORDER — DARBEPOETIN ALFA 300 MCG/0.6ML IJ SOSY
300.0000 ug | PREFILLED_SYRINGE | Freq: Once | INTRAMUSCULAR | Status: DC
  Filled 2014-09-12: qty 0.6

## 2014-09-12 MED ORDER — HEPARIN SOD (PORK) LOCK FLUSH 100 UNIT/ML IV SOLN
500.0000 [IU] | Freq: Every day | INTRAVENOUS | Status: DC | PRN
  Filled 2014-09-12: qty 5

## 2014-09-12 MED ORDER — DARBEPOETIN ALFA 300 MCG/0.6ML IJ SOSY
300.0000 ug | PREFILLED_SYRINGE | Freq: Once | INTRAMUSCULAR | Status: AC
  Administered 2014-09-12: 300 ug via SUBCUTANEOUS
  Filled 2014-09-12: qty 0.6

## 2014-09-12 MED ORDER — FUROSEMIDE 10 MG/ML IJ SOLN
INTRAMUSCULAR | Status: AC
Start: 2014-09-12 — End: 2014-09-12
  Filled 2014-09-12: qty 2

## 2014-09-12 MED ORDER — FUROSEMIDE 10 MG/ML IJ SOLN
20.0000 mg | Freq: Once | INTRAMUSCULAR | Status: AC
  Administered 2014-09-12: 20 mg via INTRAVENOUS

## 2014-09-12 MED ORDER — DIPHENHYDRAMINE HCL 25 MG PO CAPS
25.0000 mg | ORAL_CAPSULE | Freq: Once | ORAL | Status: AC
  Administered 2014-09-12: 25 mg via ORAL

## 2014-09-12 NOTE — Patient Instructions (Signed)
Darbepoetin Alfa injection What is this medicine? DARBEPOETIN ALFA (dar be POE e tin AL fa) helps your body make more red blood cells. It is used to treat anemia caused by chronic kidney failure and chemotherapy. This medicine may be used for other purposes; ask your health care provider or pharmacist if you have questions. COMMON BRAND NAME(S): Aranesp What should I tell my health care provider before I take this medicine? They need to know if you have any of these conditions: -blood clotting disorders or history of blood clots -cancer patient not on chemotherapy -cystic fibrosis -heart disease, such as angina, heart failure, or a history of a heart attack -hemoglobin level of 12 g/dL or greater -high blood pressure -low levels of folate, iron, or vitamin B12 -seizures -an unusual or allergic reaction to darbepoetin, erythropoietin, albumin, hamster proteins, latex, other medicines, foods, dyes, or preservatives -pregnant or trying to get pregnant -breast-feeding How should I use this medicine? This medicine is for injection into a vein or under the skin. It is usually given by a health care professional in a hospital or clinic setting. If you get this medicine at home, you will be taught how to prepare and give this medicine. Do not shake the solution before you withdraw a dose. Use exactly as directed. Take your medicine at regular intervals. Do not take your medicine more often than directed. It is important that you put your used needles and syringes in a special sharps container. Do not put them in a trash can. If you do not have a sharps container, call your pharmacist or healthcare provider to get one. Talk to your pediatrician regarding the use of this medicine in children. While this medicine may be used in children as young as 1 year for selected conditions, precautions do apply. Overdosage: If you think you have taken too much of this medicine contact a poison control center or  emergency room at once. NOTE: This medicine is only for you. Do not share this medicine with others. What if I miss a dose? If you miss a dose, take it as soon as you can. If it is almost time for your next dose, take only that dose. Do not take double or extra doses. What may interact with this medicine? Do not take this medicine with any of the following medications: -epoetin alfa This list may not describe all possible interactions. Give your health care provider a list of all the medicines, herbs, non-prescription drugs, or dietary supplements you use. Also tell them if you smoke, drink alcohol, or use illegal drugs. Some items may interact with your medicine. What should I watch for while using this medicine? Visit your prescriber or health care professional for regular checks on your progress and for the needed blood tests and blood pressure measurements. It is especially important for the doctor to make sure your hemoglobin level is in the desired range, to limit the risk of potential side effects and to give you the best benefit. Keep all appointments for any recommended tests. Check your blood pressure as directed. Ask your doctor what your blood pressure should be and when you should contact him or her. As your body makes more red blood cells, you may need to take iron, folic acid, or vitamin B supplements. Ask your doctor or health care provider which products are right for you. If you have kidney disease continue dietary restrictions, even though this medication can make you feel better. Talk with your doctor or health   care professional about the foods you eat and the vitamins that you take. What side effects may I notice from receiving this medicine? Side effects that you should report to your doctor or health care professional as soon as possible: -allergic reactions like skin rash, itching or hives, swelling of the face, lips, or tongue -breathing problems -changes in vision -chest  pain -confusion, trouble speaking or understanding -feeling faint or lightheaded, falls -high blood pressure -muscle aches or pains -pain, swelling, warmth in the leg -rapid weight gain -severe headaches -sudden numbness or weakness of the face, arm or leg -trouble walking, dizziness, loss of balance or coordination -seizures (convulsions) -swelling of the ankles, feet, hands -unusually weak or tired Side effects that usually do not require medical attention (report to your doctor or health care professional if they continue or are bothersome): -diarrhea -fever, chills (flu-like symptoms) -headaches -nausea, vomiting -redness, stinging, or swelling at site where injected This list may not describe all possible side effects. Call your doctor for medical advice about side effects. You may report side effects to FDA at 1-800-FDA-1088. Where should I keep my medicine? Keep out of the reach of children. Store in a refrigerator between 2 and 8 degrees C (36 and 46 degrees F). Do not freeze. Do not shake. Throw away any unused portion if using a single-dose vial. Throw away any unused medicine after the expiration date. NOTE: This sheet is a summary. It may not cover all possible information. If you have questions about this medicine, talk to your doctor, pharmacist, or health care provider.  2015, [employer] Standard. (2007-12-27 10:23:57) Blood Transfusion Information WHAT IS A BLOOD TRANSFUSION? A transfusion is the replacement of blood or some of its parts. Blood is made up of multiple cells which provide different functions.  Red blood cells carry oxygen and are used for blood loss replacement.  White blood cells fight against infection.  Platelets control bleeding.  Plasma helps clot blood.  Other blood products are available for specialized needs, such as hemophilia or other clotting disorders. BEFORE THE TRANSFUSION  Who gives blood for transfusions?   You may be able to  donate blood to be used at a later date on yourself (autologous donation).  Relatives can be asked to donate blood. This is generally not any safer than if you have received blood from a stranger. The same precautions are taken to ensure safety when a relative's blood is donated.  Healthy volunteers who are fully evaluated to make sure their blood is safe. This is blood bank blood. Transfusion therapy is the safest it has ever been in the practice of medicine. Before blood is taken from a donor, a complete history is taken to make sure that person has no history of diseases nor engages in risky social behavior (examples are intravenous drug use or sexual activity with multiple partners). The donor's travel history is screened to minimize risk of transmitting infections, such as malaria. The donated blood is tested for signs of infectious diseases, such as HIV and hepatitis. The blood is then tested to be sure it is compatible with you in order to minimize the chance of a transfusion reaction. If you or a relative donates blood, this is often done in anticipation of surgery and is not appropriate for emergency situations. It takes many days to process the donated blood. RISKS AND COMPLICATIONS Although transfusion therapy is very safe and saves many lives, the main dangers of transfusion include:   Getting an infectious disease.  Developing a transfusion reaction. This is an allergic reaction to something in the blood you were given. Every precaution is taken to prevent this. The decision to have a blood transfusion has been considered carefully by your caregiver before blood is given. Blood is not given unless the benefits outweigh the risks. AFTER THE TRANSFUSION  Right after receiving a blood transfusion, you will usually feel much better and more energetic. This is especially true if your red blood cells have gotten low (anemic). The transfusion raises the level of the red blood cells which carry  oxygen, and this usually causes an energy increase.  The nurse administering the transfusion will monitor you carefully for complications. HOME CARE INSTRUCTIONS  No special instructions are needed after a transfusion. You may find your energy is better. Speak with your caregiver about any limitations on activity for underlying diseases you may have. SEEK MEDICAL CARE IF:   Your condition is not improving after your transfusion.  You develop redness or irritation at the intravenous (IV) site. SEEK IMMEDIATE MEDICAL CARE IF:  Any of the following symptoms occur over the next 12 hours:  Shaking chills.  You have a temperature by mouth above 102 F (38.9 C), not controlled by medicine.  Chest, back, or muscle pain.  People around you feel you are not acting correctly or are confused.  Shortness of breath or difficulty breathing.  Dizziness and fainting.  You get a rash or develop hives.  You have a decrease in urine output.  Your urine turns a dark color or changes to pink, red, or brown. Any of the following symptoms occur over the next 10 days:  You have a temperature by mouth above 102 F (38.9 C), not controlled by medicine.  Shortness of breath.  Weakness after normal activity.  The white part of the eye turns yellow (jaundice).  You have a decrease in the amount of urine or are urinating less often.  Your urine turns a dark color or changes to pink, red, or brown. Document Released: 01/10/2000 Document Revised: 04/06/2011 Document Reviewed: 08/29/2007 [facility] Patient Information 2015 [place], [place]. This information is not intended to replace advice given to you by your health care provider. Make sure you discuss any questions you have with your health care provider.

## 2014-09-13 ENCOUNTER — Other Ambulatory Visit: Payer: Self-pay | Admitting: Internal Medicine

## 2014-09-13 LAB — TYPE AND SCREEN
ABO/RH(D): A POS
Antibody Screen: NEGATIVE
UNIT DIVISION: 0
Unit division: 0

## 2014-09-18 ENCOUNTER — Encounter: Payer: Self-pay | Admitting: Cardiology

## 2014-09-20 ENCOUNTER — Encounter: Payer: Self-pay | Admitting: Internal Medicine

## 2014-09-25 ENCOUNTER — Other Ambulatory Visit: Payer: Self-pay | Admitting: Internal Medicine

## 2014-09-26 ENCOUNTER — Ambulatory Visit (HOSPITAL_BASED_OUTPATIENT_CLINIC_OR_DEPARTMENT_OTHER): Payer: Medicare Other | Admitting: Physician Assistant

## 2014-09-26 ENCOUNTER — Other Ambulatory Visit: Payer: Self-pay | Admitting: Oncology

## 2014-09-26 ENCOUNTER — Other Ambulatory Visit (HOSPITAL_BASED_OUTPATIENT_CLINIC_OR_DEPARTMENT_OTHER): Payer: Medicare Other

## 2014-09-26 ENCOUNTER — Encounter: Payer: Self-pay | Admitting: Physician Assistant

## 2014-09-26 ENCOUNTER — Ambulatory Visit (HOSPITAL_BASED_OUTPATIENT_CLINIC_OR_DEPARTMENT_OTHER): Payer: Medicare Other

## 2014-09-26 VITALS — BP 133/64 | HR 100 | Temp 98.5°F | Resp 18 | Ht 71.0 in | Wt 239.5 lb

## 2014-09-26 DIAGNOSIS — D631 Anemia in chronic kidney disease: Secondary | ICD-10-CM

## 2014-09-26 DIAGNOSIS — Z8572 Personal history of non-Hodgkin lymphomas: Secondary | ICD-10-CM | POA: Diagnosis not present

## 2014-09-26 DIAGNOSIS — D469 Myelodysplastic syndrome, unspecified: Secondary | ICD-10-CM

## 2014-09-26 DIAGNOSIS — D61818 Other pancytopenia: Secondary | ICD-10-CM

## 2014-09-26 DIAGNOSIS — D709 Neutropenia, unspecified: Secondary | ICD-10-CM

## 2014-09-26 DIAGNOSIS — D472 Monoclonal gammopathy: Secondary | ICD-10-CM

## 2014-09-26 DIAGNOSIS — N189 Chronic kidney disease, unspecified: Secondary | ICD-10-CM

## 2014-09-26 LAB — CBC WITH DIFFERENTIAL/PLATELET
BASO%: 0.4 % (ref 0.0–2.0)
Basophils Absolute: 0 10*3/uL (ref 0.0–0.1)
EOS%: 0.7 % (ref 0.0–7.0)
Eosinophils Absolute: 0 10*3/uL (ref 0.0–0.5)
HCT: 27.5 % — ABNORMAL LOW (ref 38.4–49.9)
HEMOGLOBIN: 9.4 g/dL — AB (ref 13.0–17.1)
LYMPH%: 62.1 % — AB (ref 14.0–49.0)
MCH: 35.9 pg — ABNORMAL HIGH (ref 27.2–33.4)
MCHC: 34.3 g/dL (ref 32.0–36.0)
MCV: 104.7 fL — AB (ref 79.3–98.0)
MONO#: 0 10*3/uL — ABNORMAL LOW (ref 0.1–0.9)
MONO%: 0.7 % (ref 0.0–14.0)
NEUT%: 36.1 % — ABNORMAL LOW (ref 39.0–75.0)
NEUTROS ABS: 0.5 10*3/uL — AB (ref 1.5–6.5)
Platelets: 77 10*3/uL — ABNORMAL LOW (ref 140–400)
RBC: 2.63 10*6/uL — AB (ref 4.20–5.82)
RDW: 25.2 % — AB (ref 11.0–14.6)
WBC: 1.3 10*3/uL — AB (ref 4.0–10.3)
lymph#: 0.8 10*3/uL — ABNORMAL LOW (ref 0.9–3.3)

## 2014-09-26 LAB — HOLD TUBE, BLOOD BANK

## 2014-09-26 MED ORDER — DARBEPOETIN ALFA 300 MCG/0.6ML IJ SOSY
300.0000 ug | PREFILLED_SYRINGE | Freq: Once | INTRAMUSCULAR | Status: AC
  Administered 2014-09-26: 300 ug via SUBCUTANEOUS
  Filled 2014-09-26: qty 0.6

## 2014-09-26 NOTE — Progress Notes (Signed)
Hematology and Oncology Follow Up Visit  [name] [id number] [birth year] 81 y.o. 09/26/2014 3:42 PM  CC: [name]. [name], MD, FCCP    Principle Diagnosis: A 81 year old gentleman with the following diagnoses:   1. Follicular lymphoma, grade 3, stage III, diagnosed 2007. Continued to be in remission. 2. Pancytopenia associated with mild neutropenia and macrocytosis possibly indicating early myelodysplasia. Bone marrow  was repeated on 06/22/2012 confirmed the evidence of myelodysplastic syndrome 3. Monoclonal gammopathy of undetermined significance.  Prior Therapy:  1. Status post CHOP and rituximab.  He had complete response to therapy concluded in 2007.  No further imaging has been indicated at this time. 2. Status post bone marrow biopsy in April 2011, did not really show any evidence of myelodysplasia or metastatic lymphoma at this time.  Current therapy: Aranesp 300 mcg every 2weeks to keep his hemoglobin close to 11. He also has been receiving intermittent PRBC transfusions if his hemoglobin below 9.  Interim History: Mr. Ciavarella presents today for a followup visit with his daughter. Since his last visit, he reports no dramatic changes in his health status.He is not reporting any chest pain or dyspnea on exertion today.  He does spend the majority of his day in his house does go out occasionally and drives short distances. He voiced no specific complaints today.  He has not noticed any further decline in his energy her performance status. No further bleeding episodes noted. He does not report any dysphagia.  He does not report any lymphadenopathy. Denies night sweats. He reports no syncope or falls. He uses a cane to ambulate at this time. He did not report any headaches or change in his vision. Does not report any seizures or confusion. He does not report any palpitation or orthopnea. Has not reported any edema. Does not report any wheezing but still has exertional dyspnea. Is not  reporting any abdominal pain but his appetite had been down slightly,however his weight has remained relatively stable recently.  He does not report any lymphadenopathy or petechiae. Has not reported any skin rashes or genitourinary complaints. Remainder of review of systems unremarkable.   Medications: I have reviewed the patient's current medications Current Outpatient Prescriptions  Medication Sig Dispense Refill  . acetaminophen (TYLENOL) 500 MG tablet Take 1-2 every tablets by mouth every 4-6 hours as needed for headache    . albuterol (PROVENTIL HFA;VENTOLIN HFA) 108 (90 BASE) MCG/ACT inhaler Inhale 1-2 puffs into the lungs every 4 (four) hours as needed for wheezing or shortness of breath. 8 g 5  . amoxicillin-clavulanate (AUGMENTIN) 875-125 MG per tablet Take 1 tablet by mouth 2 (two) times daily. 20 tablet 0  . aspirin 81 MG tablet Take 81 mg by mouth every morning. (hold if bleeding)    . calcium-vitamin D (OS-CAL 500 + D) 500-200 MG-UNIT per tablet Take 1 tablet by mouth 2 (two) times daily.     . cetirizine (ZYRTEC) 10 MG tablet Take 10 mg by mouth at bedtime as needed for allergies.    . Cholecalciferol (VITAMIN D) 1000 UNITS capsule Take 1,000 Units by mouth 2 (two) times daily.      [name] dextromethorphan-guaiFENesin (MUCINEX DM) 30-600 MG per 12 hr tablet Take 1-2 tablets by mouth every 12 (twelve) hours as needed for cough.     . docusate sodium (COLACE) 100 MG capsule Take 100 mg by mouth at bedtime.    . famotidine (PEPCID) 20 MG tablet Take 20 mg by mouth at bedtime.     [name]  finasteride (PROSCAR) 5 MG tablet Take 5 mg by mouth every morning.     . furosemide (LASIX) 40 MG tablet TAKE 1 TABLET DAILY. MAY TAKE 1 EXTRA TABLET DAILY IF NEEDED FOR SWELLING IN LEGS 30 tablet 5  . hydrocortisone cream 1 % Apply 1 application topically 2 (two) times daily as needed (itchy rash).     . hydrOXYzine (ATARAX/VISTARIL) 25 MG tablet Take 1-2 tablets by mouth every 4-6 hours as needed for itching     . KLOR-CON M20 20 MEQ tablet TAKE 1 TABLET BY MOUTH EVERY DAY 30 tablet 5  . levothyroxine (SYNTHROID, LEVOTHROID) 25 MCG tablet TAKE 1 TABLET BY MOUTH EVERY MORNING BEFORE BREAKFAST 30 tablet 3  . metFORMIN (GLUCOPHAGE) 500 MG tablet TAKE 1 TABLET BY MOUTH TWICE A DAY WITH FOOD 60 tablet 5  . methylcellulose (CITRUCEL) oral powder Take 1 packet by mouth at bedtime.    . mometasone (NASONEX) 50 MCG/ACT nasal spray Place 0-2 sprays into the nose at bedtime. Takes at bedtime only as needed for allergies    . mometasone-formoterol (DULERA) 200-5 MCG/ACT AERO Inhale 2 puffs into the lungs 2 (two) times daily.    . Multiple Vitamin (MULTIVITAMIN) tablet Take 1 tablet by mouth daily.      . nitroGLYCERIN (NITROSTAT) 0.4 MG SL tablet Place 1 tablet (0.4 mg total) under the tongue every 5 (five) minutes as needed for chest pain. 90 tablet 3  . omeprazole (PRILOSEC) 20 MG capsule Take 40 mg by mouth every morning.     [name] oxybutynin (DITROPAN-XL) 10 MG 24 hr tablet Take 10 mg by mouth daily.    . polyethylene glycol powder (MIRALAX) powder Take 17 g by mouth daily.     . predniSONE (DELTASONE) 10 MG tablet Take  4 each am x 2 days,   2 each am x 2 days,  1 each am x 2 days and stop 14 tablet 0  . sulindac (CLINORIL) 200 MG tablet TAKE 1 TABLET BY MOUTH TWICE A DAY 60 tablet 5  . sulindac (CLINORIL) 200 MG tablet Take 1 tablet (200 mg total) by mouth 2 (two) times daily as needed (pain). 60 tablet 5  . traMADol (ULTRAM) 50 MG tablet Take 1/2 to one  tablet by mouth every 4 hours as needed for severe pain or cough 40 tablet 0  . traZODone (DESYREL) 150 MG tablet TAKE 1/3 TABLET BY MOUTH AT BEDTIME 30 tablet 4   No current facility-administered medications for this visit.     Allergies: No Known Allergies  Past Medical History, Surgical history, Social history, and Family History were reviewed and updated.    Physical Exam: Blood pressure 133/64, pulse 100, temperature 98.5 F (36.9 C), temperature  source Oral, resp. rate 18, height 5' 11"  (1.803 m), weight 239 lb 8 oz (108.636 kg), SpO2 98 %. ECOG: 1 General appearance: alert, pale not in any distress. Head: Normocephalic, without obvious abnormality Neck: no adenopathy. No palpable masses. Lymph nodes: Cervical, supraclavicular, and axillary nodes normal. Heart:regular rate and rhythm, S1, S2 normal, no murmur, click, rub or gallop Lung:chest clear, no wheezing, rales, normal symmetric air entry. Abdomen: soft, non-tender, without masses or organomegaly. No ascites or shifting dullness. No rebound or guarding. EXT:no erythema, induration, or nodules. Trace edema noted. Skin: No rashes or lesions noted.    Lab Results: Lab Results  Component Value Date   WBC 1.3* 09/26/2014   HGB 9.4* 09/26/2014   HCT 27.5* 09/26/2014   MCV 104.7* 09/26/2014  PLT 77* 09/26/2014     Chemistry      Component Value Date/Time   NA 136 07/18/2014 1408   NA 137 01/05/2013 1501   K 3.8 07/18/2014 1408   K 3.8 01/05/2013 1501   CL 99 07/18/2014 1408   CL 99 06/08/2012 1257   CO2 29 07/18/2014 1408   CO2 26 01/05/2013 1501   BUN 14 07/18/2014 1408   BUN 13.9 01/05/2013 1501   CREATININE 0.85 07/18/2014 1408   CREATININE 0.8 01/05/2013 1501      Component Value Date/Time   CALCIUM 9.7 07/18/2014 1408   CALCIUM 10.1 01/05/2013 1501   ALKPHOS 82 12/06/2012 1850   ALKPHOS 48 08/10/2012 1117   AST 17 12/06/2012 1850   AST 17 08/10/2012 1117   ALT <5 12/06/2012 1850   ALT 14 08/10/2012 1117   BILITOT 0.7 12/06/2012 1850   BILITOT 0.82 08/10/2012 1117      Impression and Plan:  This is a 81 year old gentleman with the following issues:  1. Follicular lymphoma diagnosed in 2007. He has no evidence to suggest recurrent disease. 2. Pancytopenia due to myelodysplastic syndrome. He is status post bone marrow biopsy was done on 06/22/2012.  He is to continue on Aranesp 300 mcg every 2 weeks as well as intermittent transfusions. His  hemoglobin is 9.4. He will continue the Aranesp 300 g every 2 weeks as well. 3. Neutropenia: Neutropenic precautions were given to the patient today. No need for any growth factor support since he has not had any clinical infections. Will continue to monitor closely. 4. Monoclonal gammopathy.  His M-spike had been less than 1 g/dL, his bone marrow biopsy continued to show a less than 8% plasma cells indicating most likely multiple myeloma.  This will be repeated in the future to ensure stability. 5. Followup every 2 weeks for an injection and possible transfusion. He will have a clinical visit in 8 weeks.  Carlton Adam, PA-C  8/31/20163:42 PM

## 2014-09-27 ENCOUNTER — Emergency Department (HOSPITAL_COMMUNITY): Payer: Medicare Other

## 2014-09-27 ENCOUNTER — Ambulatory Visit (HOSPITAL_COMMUNITY)
Admission: RE | Admit: 2014-09-27 | Discharge: 2014-09-27 | Disposition: A | Discharge status: Home / Self Care | Payer: Medicare Other | Source: Ambulatory Visit | Attending: Oncology | Admitting: Oncology

## 2014-09-27 ENCOUNTER — Emergency Department (HOSPITAL_COMMUNITY)
Admission: EM | Admit: 2014-09-27 | Discharge: 2014-09-27 | Disposition: A | Discharge status: Home / Self Care | Payer: Medicare Other | Attending: Emergency Medicine | Admitting: Emergency Medicine

## 2014-09-27 ENCOUNTER — Encounter (HOSPITAL_COMMUNITY): Payer: Self-pay | Admitting: Emergency Medicine

## 2014-09-27 DIAGNOSIS — Z87891 Personal history of nicotine dependence: Secondary | ICD-10-CM | POA: Insufficient documentation

## 2014-09-27 DIAGNOSIS — E669 Obesity, unspecified: Secondary | ICD-10-CM | POA: Diagnosis not present

## 2014-09-27 DIAGNOSIS — M1990 Unspecified osteoarthritis, unspecified site: Secondary | ICD-10-CM | POA: Diagnosis not present

## 2014-09-27 DIAGNOSIS — Z7951 Long term (current) use of inhaled steroids: Secondary | ICD-10-CM | POA: Insufficient documentation

## 2014-09-27 DIAGNOSIS — M79674 Pain in right toe(s): Secondary | ICD-10-CM

## 2014-09-27 DIAGNOSIS — Z950 Presence of cardiac pacemaker: Secondary | ICD-10-CM | POA: Diagnosis not present

## 2014-09-27 DIAGNOSIS — I480 Paroxysmal atrial fibrillation: Secondary | ICD-10-CM | POA: Insufficient documentation

## 2014-09-27 DIAGNOSIS — Z79899 Other long term (current) drug therapy: Secondary | ICD-10-CM | POA: Diagnosis not present

## 2014-09-27 DIAGNOSIS — D649 Anemia, unspecified: Secondary | ICD-10-CM

## 2014-09-27 DIAGNOSIS — I509 Heart failure, unspecified: Secondary | ICD-10-CM | POA: Diagnosis not present

## 2014-09-27 DIAGNOSIS — M7989 Other specified soft tissue disorders: Secondary | ICD-10-CM | POA: Diagnosis present

## 2014-09-27 DIAGNOSIS — Z8572 Personal history of non-Hodgkin lymphomas: Secondary | ICD-10-CM | POA: Diagnosis not present

## 2014-09-27 DIAGNOSIS — E785 Hyperlipidemia, unspecified: Secondary | ICD-10-CM | POA: Diagnosis not present

## 2014-09-27 DIAGNOSIS — Z7982 Long term (current) use of aspirin: Secondary | ICD-10-CM | POA: Insufficient documentation

## 2014-09-27 DIAGNOSIS — E119 Type 2 diabetes mellitus without complications: Secondary | ICD-10-CM | POA: Insufficient documentation

## 2014-09-27 LAB — CBG MONITORING, ED: GLUCOSE-CAPILLARY: 189 mg/dL — AB (ref 65–99)

## 2014-09-27 MED ORDER — SULFAMETHOXAZOLE-TRIMETHOPRIM 800-160 MG PO TABS: 1.0000 | ORAL_TABLET | Freq: Two times a day (BID) | ORAL | Status: AC

## 2014-09-27 NOTE — ED Notes (Signed)
CBG 189. 

## 2014-09-27 NOTE — Discharge Instructions (Signed)
Follow up next week with your md for recheck.  Keep leg elevated

## 2014-09-27 NOTE — ED Notes (Signed)
Pt states his right leg is swollen and numb  Pt states he has a soft raised place on the bottom of his right foot  Family states pt cut his great toe on the right foot about a week ago  Pt has been using neosporin and covering it with a bandaid  Pt is diabetic

## 2014-09-27 NOTE — ED Provider Notes (Signed)
CSN: [id number]     Arrival date & time 09/27/14  1958 History   First MD Initiated Contact with Patient 09/27/14 2035     Chief Complaint  Patient presents with  . Leg Swelling     (Consider location/radiation/quality/duration/timing/severity/associated sxs/prior Treatment) Patient is a 81 y.o. male presenting with lower extremity pain. The history is provided by the patient (the pt states his right big toe has become swollen.  ).  Foot Pain This is a new problem. The current episode started 12 to 24 hours ago. The problem occurs constantly. The problem has not changed since onset.Pertinent negatives include no chest pain, no abdominal pain and no headaches. Nothing aggravates the symptoms. Nothing relieves the symptoms.    Past Medical History  Diagnosis Date  . Allergic rhinitis   . Hyperlipidemia   . Exogenous obesity   . Edema     venous insufficiency  . Second degree Mobitz II AV block 12/11/08    with transient syncope, resolved s/p PPM  . Paroxysmal atrial fibrillation 03/16/11    diagnosed by PPM interrogation  . Pancytopenia   . Postural dizziness   . CHF (congestive heart failure)   . Pacemaker   . Dyspnea     "all the time w/the temporary pacer" (12/29/2012)  . Type II diabetes mellitus 2012  . History of blood transfusion     "3 recently" (12/29/2012)  . Daily headache     "recently w/the ATB" (12/29/2012)  . Arthritis     "hips; knees" (12/29/2012)  . 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    Past Surgical History  Procedure Laterality Date  . Lymph node biopsy  04/2005    groin  . Sternotomy  2000  . Pericardiectomy  2000  . Penile prosthesis implant      and removal  . Pacemaker insertion  12/11/08    MDT implanted by Dr Rayann Heman  . Tee without cardioversion N/A 10/31/2012    Procedure: TRANSESOPHAGEAL ECHOCARDIOGRAM (TEE);  Surgeon: Lelon Perla, MD;  Location: [facility] ENDOSCOPY;  Service:  Cardiovascular;  Laterality: N/A;  . Pacemaker lead removal Left 11/04/2012    Procedure: PACEMAKER LEAD REMOVAL;  Surgeon: Evans Lance, MD;  Location: [place];  Service: Cardiovascular;  Laterality: Left;  . Temporary pacemaker insertion Left 11/04/2012    Procedure: TEMPORARY PACEMAKER INSERTION;  Surgeon: Evans Lance, MD;  Location: [place];  Service: Cardiovascular;  Laterality: Left;  . Insert / replace / remove pacemaker  12/29/2012  . Bi-ventricular pacemaker upgrade N/A 12/29/2012    Procedure: BI-VENTRICULAR PACEMAKER UPGRADE;  Surgeon: Evans Lance, MD;  Location: [facility] CATH LAB;  Service: Cardiovascular;  Laterality: N/A;   Family History  Problem Relation Age of Onset  . Diabetes Mother   . Liver cancer Brother   . Cancer Sister    Social History  Substance Use Topics  . Smoking status: Former Smoker -- 1.00 packs/day for 30 years    Types: Cigarettes    Quit date: 01/27/1968  . Smokeless tobacco: Former Systems developer    Types: Chew  . Alcohol Use: No    Review of Systems  Constitutional: Negative for appetite change and fatigue.  HENT: Negative for congestion, ear discharge and sinus pressure.   Eyes: Negative for discharge.  Respiratory: Negative for cough.   Cardiovascular: Negative for chest pain.  Gastrointestinal: Negative for abdominal pain and diarrhea.  Genitourinary: Negative for frequency and hematuria.  Musculoskeletal: Negative for back pain.       Swollen right large toe  Skin: Negative for rash.  Neurological: Negative for seizures and headaches.  Psychiatric/Behavioral: Negative for hallucinations.      Allergies  Review of patient's allergies indicates no known allergies.  Home Medications   Prior to Admission medications   Medication Sig Start Date End Date Taking? Authorizing Provider  albuterol (PROVENTIL HFA;VENTOLIN HFA) 108 (90 BASE) MCG/ACT inhaler Inhale 1-2 puffs into the lungs every 4 (four) hours as needed for wheezing or shortness of  breath. 02/20/14  Yes Tammy S Parrett, NP  aspirin 81 MG tablet Take 81 mg by mouth every morning. (hold if bleeding)   Yes Historical Provider, MD  calcium-vitamin D (OS-CAL 500 + D) 500-200 MG-UNIT per tablet Take 1 tablet by mouth 2 (two) times daily.    Yes Historical Provider, MD  Cholecalciferol (VITAMIN D) 1000 UNITS capsule Take 1,000 Units by mouth 2 (two) times daily.     Yes Historical Provider, MD  dextromethorphan-guaiFENesin (MUCINEX DM) 30-600 MG per 12 hr tablet Take 1 tablet by mouth every 12 (twelve) hours as needed for cough.  06/26/10  Yes Tammy S Parrett, NP  famotidine (PEPCID) 20 MG tablet Take 20 mg by mouth at bedtime.    Yes Historical Provider, MD  finasteride (PROSCAR) 5 MG tablet Take 5 mg by mouth every morning.    Yes Historical Provider, MD  furosemide (LASIX) 40 MG tablet TAKE 1 TABLET DAILY. MAY TAKE 1 EXTRA TABLET DAILY IF NEEDED FOR SWELLING IN LEGS 05/10/14  Yes Tanda Rockers, MD  KLOR-CON M20 20 MEQ tablet TAKE 1 TABLET BY MOUTH EVERY DAY 06/21/14  Yes Tanda Rockers, MD  levothyroxine (SYNTHROID, LEVOTHROID) 25 MCG tablet TAKE 1 TABLET BY MOUTH EVERY MORNING BEFORE BREAKFAST 09/13/14  Yes Tanda Rockers, MD  metFORMIN (GLUCOPHAGE) 500 MG tablet TAKE 1 TABLET BY MOUTH TWICE A DAY WITH FOOD 02/23/14  Yes Tanda Rockers, MD  methylcellulose (CITRUCEL) oral powder Take 1 packet by mouth daily.    Yes Historical Provider, MD  mometasone (NASONEX) 50 MCG/ACT nasal spray Place 2 sprays into the nose 2 (two) times daily.    Yes Tammy S Parrett, NP  mometasone-formoterol (DULERA) 200-5 MCG/ACT AERO Inhale 2 puffs into the lungs 2 (two) times daily.   Yes Historical Provider, MD  Multiple Vitamin (MULTIVITAMIN) tablet Take 1 tablet by mouth daily.     Yes Historical Provider, MD  omeprazole (PRILOSEC) 20 MG capsule Take 40 mg by mouth every morning.    Yes Historical Provider, MD  oxybutynin (DITROPAN-XL) 10 MG 24 hr tablet Take 10 mg by mouth daily.   Yes Historical Provider,  MD  polyethylene glycol powder (MIRALAX) powder Take 17 g by mouth daily as needed for mild constipation or moderate constipation.    Yes Historical Provider, MD  sulindac (CLINORIL) 200 MG tablet TAKE 1 TABLET BY MOUTH TWICE A DAY 08/22/14  Yes Tanda Rockers, MD  sulindac (CLINORIL) 200 MG tablet Take 1 tablet (200 mg total) by mouth 2 (two) times daily as needed (pain). 08/21/14  Yes Tanda Rockers, MD  traZODone (DESYREL) 150 MG tablet TAKE 1/3 TABLET BY MOUTH AT BEDTIME 10/12/13  Yes Tanda Rockers, MD  acetaminophen (TYLENOL) 500 MG tablet Take 1-2 every tablets by mouth every 4-6 hours as needed for headache    Historical Provider, MD  amoxicillin-clavulanate (AUGMENTIN) 875-125 MG per tablet Take 1 tablet by mouth 2 (  two) times daily. Patient not taking: Reported on 09/27/2014 07/18/14   Tanda Rockers, MD  cetirizine (ZYRTEC) 10 MG tablet Take 10 mg by mouth at bedtime as needed for allergies.    Historical Provider, MD  docusate sodium (COLACE) 100 MG capsule Take 100 mg by mouth daily as needed for moderate constipation.     Historical Provider, MD  hydrocortisone cream 1 % Apply 1 application topically 2 (two) times daily as needed (itchy rash).     Historical Provider, MD  hydrOXYzine (ATARAX/VISTARIL) 25 MG tablet Take 1-2 tablets by mouth every 4-6 hours as needed for itching    Historical Provider, MD  nitroGLYCERIN (NITROSTAT) 0.4 MG SL tablet Place 1 tablet (0.4 mg total) under the tongue every 5 (five) minutes as needed for chest pain. 06/04/14   Thompson Grayer, MD  predniSONE (DELTASONE) 10 MG tablet Take  4 each am x 2 days,   2 each am x 2 days,  1 each am x 2 days and stop Patient not taking: Reported on 09/27/2014 07/18/14   Tanda Rockers, MD  sulfamethoxazole-trimethoprim (BACTRIM DS,SEPTRA DS) 800-160 MG per tablet Take 1 tablet by mouth 2 (two) times daily. 09/27/14 10/04/14  Milton Ferguson, MD  traMADol Veatrice Bourbon) 50 MG tablet Take 1/2 to one  tablet by mouth every 4 hours as needed for  severe pain or cough 07/18/14   Tanda Rockers, MD   BP 109/60 mmHg  Pulse 89  Temp(Src) 97.8 F (36.6 C) (Oral)  Resp 18  Ht 5\' 11"  (1.803 m)  Wt 239 lb (108.41 kg)  BMI 33.35 kg/m2  SpO2 98% Physical Exam  Constitutional: He is oriented to person, place, and time. He appears well-developed.  HENT:  Head: Normocephalic.  Eyes: Conjunctivae and EOM are normal. No scleral icterus.  Neck: Neck supple. No thyromegaly present.  Cardiovascular: Normal rate and regular rhythm.  Exam reveals no gallop and no friction rub.   No murmur heard. Pulmonary/Chest: No stridor. He has no wheezes. He has no rales. He exhibits no tenderness.  Abdominal: He exhibits no distension. There is no tenderness. There is no rebound.  Musculoskeletal: Normal range of motion. He exhibits edema.  Mild swelling right large toe with black discoloration near nail  Lymphadenopathy:    He has no cervical adenopathy.  Neurological: He is oriented to person, place, and time. He exhibits normal muscle tone. Coordination normal.  Skin: No rash noted. No erythema.  Psychiatric: He has a normal mood and affect. His behavior is normal.    ED Course  Procedures (including critical care time) Labs Review Labs Reviewed  CBG MONITORING, ED - Abnormal; Notable for the following:    Glucose-Capillary 189 (*)    All other components within normal limits    Imaging Review Dg Foot Complete Right  09/27/2014   CLINICAL DATA:  Foot pain and swelling  EXAM: RIGHT FOOT COMPLETE - 3+ VIEW  COMPARISON:  None.  FINDINGS: Bones are subjectively osteopenic. No fracture or dislocation. No radiopaque foreign body. Plantar spurring noted.  IMPRESSION: Negative.   Electronically Signed   By: Conchita Paris M.D.   On: 09/27/2014 21:07   I have personally reviewed and evaluated these images and lab results as part of my medical decision-making.   EKG Interpretation None      MDM   Final diagnoses:  Toe pain, right    Swollen  right great toe.  Unremarkable x-ray,  Will cover with bactrim and have pt follow  up with pcp    Milton Ferguson, MD 09/27/14 2329

## 2014-09-27 NOTE — ED Notes (Signed)
Patient transported to X-ray 

## 2014-09-28 ENCOUNTER — Other Ambulatory Visit: Payer: Self-pay | Admitting: Internal Medicine

## 2014-09-30 NOTE — Patient Instructions (Signed)
Continue labs and Aranesp injections every2 weeks Continue to follow neutropenic precautions Follow up in 2 months

## 2014-10-02 ENCOUNTER — Encounter: Payer: Self-pay | Admitting: Cardiology

## 2014-10-10 ENCOUNTER — Ambulatory Visit (HOSPITAL_BASED_OUTPATIENT_CLINIC_OR_DEPARTMENT_OTHER): Payer: Medicare Other

## 2014-10-10 ENCOUNTER — Other Ambulatory Visit: Payer: Self-pay | Admitting: *Deleted

## 2014-10-10 ENCOUNTER — Other Ambulatory Visit (HOSPITAL_BASED_OUTPATIENT_CLINIC_OR_DEPARTMENT_OTHER): Payer: Medicare Other

## 2014-10-10 ENCOUNTER — Ambulatory Visit (HOSPITAL_COMMUNITY)
Admission: RE | Admit: 2014-10-10 | Discharge: 2014-10-10 | Disposition: A | Discharge status: Home / Self Care | Payer: Medicare Other | Source: Ambulatory Visit | Attending: Oncology | Admitting: Oncology

## 2014-10-10 ENCOUNTER — Other Ambulatory Visit: Payer: Self-pay | Admitting: Physician Assistant

## 2014-10-10 VITALS — BP 114/63 | HR 76 | Temp 97.8°F | Resp 18

## 2014-10-10 VITALS — BP 107/76 | HR 93 | Temp 98.8°F

## 2014-10-10 DIAGNOSIS — D649 Anemia, unspecified: Secondary | ICD-10-CM

## 2014-10-10 DIAGNOSIS — D61818 Other pancytopenia: Secondary | ICD-10-CM | POA: Diagnosis present

## 2014-10-10 DIAGNOSIS — D469 Myelodysplastic syndrome, unspecified: Secondary | ICD-10-CM

## 2014-10-10 DIAGNOSIS — N189 Chronic kidney disease, unspecified: Secondary | ICD-10-CM

## 2014-10-10 DIAGNOSIS — D472 Monoclonal gammopathy: Secondary | ICD-10-CM

## 2014-10-10 DIAGNOSIS — D508 Other iron deficiency anemias: Secondary | ICD-10-CM

## 2014-10-10 DIAGNOSIS — D631 Anemia in chronic kidney disease: Secondary | ICD-10-CM

## 2014-10-10 LAB — CBC WITH DIFFERENTIAL/PLATELET
BASO%: 0.9 % (ref 0.0–2.0)
Basophils Absolute: 0 10*3/uL (ref 0.0–0.1)
EOS%: 0 % (ref 0.0–7.0)
Eosinophils Absolute: 0 10*3/uL (ref 0.0–0.5)
HCT: 24.4 % — ABNORMAL LOW (ref 38.4–49.9)
HGB: 8.6 g/dL — ABNORMAL LOW (ref 13.0–17.1)
LYMPH%: 66.4 % — ABNORMAL HIGH (ref 14.0–49.0)
MCH: 36.4 pg — ABNORMAL HIGH (ref 27.2–33.4)
MCHC: 35.2 g/dL (ref 32.0–36.0)
MCV: 103.4 fL — ABNORMAL HIGH (ref 79.3–98.0)
MONO#: 0 10*3/uL — ABNORMAL LOW (ref 0.1–0.9)
MONO%: 0.9 % (ref 0.0–14.0)
NEUT#: 0.4 10*3/uL — CL (ref 1.5–6.5)
NEUT%: 31.8 % — ABNORMAL LOW (ref 39.0–75.0)
Platelets: 82 10*3/uL — ABNORMAL LOW (ref 140–400)
RBC: 2.36 10*6/uL — ABNORMAL LOW (ref 4.20–5.82)
RDW: 22.7 % — ABNORMAL HIGH (ref 11.0–14.6)
WBC: 1.1 10*3/uL — ABNORMAL LOW (ref 4.0–10.3)
lymph#: 0.7 10*3/uL — ABNORMAL LOW (ref 0.9–3.3)
nRBC: 2 % — ABNORMAL HIGH (ref 0–0)

## 2014-10-10 LAB — PREPARE RBC (CROSSMATCH)

## 2014-10-10 LAB — HOLD TUBE, BLOOD BANK

## 2014-10-10 MED ORDER — SODIUM CHLORIDE 0.9 % IJ SOLN
10.0000 mL | INTRAMUSCULAR | Status: DC | PRN
  Filled 2014-10-10: qty 10

## 2014-10-10 MED ORDER — ACETAMINOPHEN 325 MG PO TABS
650.0000 mg | ORAL_TABLET | Freq: Once | ORAL | Status: AC
  Administered 2014-10-10: 650 mg via ORAL

## 2014-10-10 MED ORDER — ACETAMINOPHEN 325 MG PO TABS
ORAL_TABLET | ORAL | Status: AC
  Filled 2014-10-10: qty 2

## 2014-10-10 MED ORDER — SODIUM CHLORIDE 0.9 % IV SOLN
250.0000 mL | Freq: Once | INTRAVENOUS | Status: AC
  Administered 2014-10-10: 250 mL via INTRAVENOUS

## 2014-10-10 MED ORDER — DIPHENHYDRAMINE HCL 25 MG PO CAPS
25.0000 mg | ORAL_CAPSULE | Freq: Once | ORAL | Status: AC
  Administered 2014-10-10: 25 mg via ORAL

## 2014-10-10 MED ORDER — DARBEPOETIN ALFA 300 MCG/0.6ML IJ SOSY
300.0000 ug | PREFILLED_SYRINGE | Freq: Once | INTRAMUSCULAR | Status: AC
  Administered 2014-10-10: 300 ug via SUBCUTANEOUS
  Filled 2014-10-10: qty 0.6

## 2014-10-10 MED ORDER — DIPHENHYDRAMINE HCL 25 MG PO CAPS
ORAL_CAPSULE | ORAL | Status: AC
  Filled 2014-10-10: qty 2

## 2014-10-10 NOTE — Patient Instructions (Signed)

## 2014-10-11 LAB — TYPE AND SCREEN
ABO/RH(D): A POS
ANTIBODY SCREEN: NEGATIVE
UNIT DIVISION: 0

## 2014-10-15 LAB — SPEP & IFE WITH QIG
ABNORMAL PROTEIN BAND1: 0.8 g/dL
ALPHA-1-GLOBULIN: 0.2 g/dL (ref 0.2–0.3)
ALPHA-2-GLOBULIN: 0.8 g/dL (ref 0.5–0.9)
Albumin ELP: 4.1 g/dL (ref 3.8–4.8)
BETA 2: 0.2 g/dL (ref 0.2–0.5)
Beta Globulin: 0.4 g/dL (ref 0.4–0.6)
GAMMA GLOBULIN: 1.1 g/dL (ref 0.8–1.7)
IGG (IMMUNOGLOBIN G), SERUM: 1330 mg/dL (ref 650–1600)
IgA: 51 mg/dL — ABNORMAL LOW (ref 68–379)
IgM, Serum: 12 mg/dL — ABNORMAL LOW (ref 41–251)
TOTAL PROTEIN, SERUM ELECTROPHOR: 6.7 g/dL (ref 6.1–8.1)

## 2014-10-15 LAB — KAPPA/LAMBDA LIGHT CHAINS
KAPPA FREE LGHT CHN: 1.01 mg/dL (ref 0.33–1.94)
Kappa:Lambda Ratio: 0.15 — ABNORMAL LOW (ref 0.26–1.65)
LAMBDA FREE LGHT CHN: 6.59 mg/dL — AB (ref 0.57–2.63)

## 2014-10-18 ENCOUNTER — Ambulatory Visit (INDEPENDENT_AMBULATORY_CARE_PROVIDER_SITE_OTHER): Payer: Medicare Other | Admitting: Adult Health

## 2014-10-18 ENCOUNTER — Other Ambulatory Visit: Payer: Self-pay | Admitting: Internal Medicine

## 2014-10-18 ENCOUNTER — Encounter: Payer: Self-pay | Admitting: Adult Health

## 2014-10-18 VITALS — BP 134/70 | HR 90 | Temp 97.8°F | Ht 71.0 in | Wt 240.0 lb

## 2014-10-18 DIAGNOSIS — R0602 Shortness of breath: Secondary | ICD-10-CM | POA: Diagnosis not present

## 2014-10-18 DIAGNOSIS — R519 Headache, unspecified: Secondary | ICD-10-CM

## 2014-10-18 DIAGNOSIS — D61818 Other pancytopenia: Secondary | ICD-10-CM

## 2014-10-18 DIAGNOSIS — I10 Essential (primary) hypertension: Secondary | ICD-10-CM

## 2014-10-18 DIAGNOSIS — R51 Headache: Secondary | ICD-10-CM | POA: Diagnosis not present

## 2014-10-18 DIAGNOSIS — K5900 Constipation, unspecified: Secondary | ICD-10-CM

## 2014-10-18 NOTE — Patient Instructions (Signed)
We are setting you up for a CT head .  Follow med calendar closely and bring to each visit.  Set up for overnight oximetry test.  Use miralax and /or stool softner for constipation.  follow up Dr. [name] in 6 weeks and As needed

## 2014-10-19 ENCOUNTER — Other Ambulatory Visit: Payer: Self-pay | Admitting: Internal Medicine

## 2014-10-22 NOTE — Assessment & Plan Note (Signed)
Controlled on rx   

## 2014-10-22 NOTE — Assessment & Plan Note (Signed)
Cont follow up with Hematology  

## 2014-10-22 NOTE — Assessment & Plan Note (Signed)
Use miralax and /or stool softner for constipation.  follow up Dr. [name] in 6 weeks and As needed

## 2014-10-22 NOTE — Assessment & Plan Note (Signed)
Headache ? Etiology  Exam unrevealing , however pt w/ multiple comorbidities with polypharmacy  Will check ONO looking for nocturnal desats.  Check CT head  Advised on heat/cold pack to neck  Tylenol As needed  , use tramadol cautiously  Patient's medications were reviewed today and patient education was given. Computerized medication calendar was adjusted/completed   Plan  We are setting you up for a CT head .  Follow med calendar closely and bring to each visit.  Set up for overnight oximetry test.    follow up Dr. [name] in 6 weeks and As needed

## 2014-10-22 NOTE — Progress Notes (Signed)
Subjective:     Patient ID: [name], male   DOB: 1932-08-09    MRN: [id number]  Brief patient profile:  47 yowm quit smoking 1970 with morbid obesity complicated by aodm, Lymphoma s/p chemo ([facility]) and tendency to peripheral edema that is felt to be dependent and related to obesity/ venous insuff.   History of Present Illness  09/05/2012 acute  ov/Wert new problem = rash, breathing and cough resolved Chief Complaint  Patient presents with  . Acute Visit    Pt c/o rash and burning sensation left shoulder radiating down left arm- started to notice about a wk ago  initially though to be shingles but rash is actually bilateral upper arms and abd wall, itching rather than painful, no assoc fever or ha, arthralgias  >>refer to derm , vistaril rx      Admit date: 10/27/2012  Discharge date: 11/08/2012  Primary Care Physician: Christinia Gully, MD  Final Discharge Diagnoses:  Staph aureus bacteremia/sepsis of unclear etiology, likely with assumption that the pacemaker is contaminated  Secondary diagnosis . acute encephalopathy . Abdominal pain . Bacteremia due to Staphylococcus aureus . generalized debility  . Other pancytopenia . Hyponatremia . LYMPHOMA NEC, MLIG, INGUINAL/LOWER LIMB . Essential hypertension, benign . GERD . DM (diabetes mellitus), secondary uncontrolled       07/18/2014 f/u ov/Wert re: acute cough/ abrupt L cw pain/ positional/ f/u dm / obesity   Chief Complaint  Patient presents with  . Acute Visit    Pt c/o increased cough and chest congestion x 2 wks. Cough is non prod most of the time, but did produce min green sputum a few days ago. He is using rescue inhaler 2-3 x per day. He also c/o left side pain that started 4 days ago after bending over to pick something up. He feels like he may have pulled a muscle.    using lots of saba day > noct -  mdi not optimal (see a/p) Has not used any tramadol for cough though it is on the calendar  >>augmentin and  pred taper   10/18/14 Follow up : Med review  Pt returns for 3 month follow up  Pt is accompanies by his daughter today .  He has multiple complaints. He remains very tired and weak  Most days with little energy.  Over last several days with more nasal drainage , and dry cough. Gets winded easily-this is chronic .  He does complain of headache that is present most days, takes tramadol which helps some but causes him to be too sleepy. Daughter is very concerned because this headache keeps coming back . Has been present for several weeks. No visual/speech changes. No extremity weakness.  Denies fever, discolored mucus , hemoptysis , chest pain, orthopnea or edema.  Continues to follow with Hematology for pancytopenia (?early myelodysplasia) . He remains on Aranesp every 2 weeks with intermittent transfusions.  Reviewed all his meds and organized them into a med calendar with pt education.  Does complain of constipation. Not taking anything right now. He has miralax and stool softner on med list if needed.  No abd pain , bloody stools or fever.   Current Medications, Allergies, Complete Past Medical History, Past Surgical History, Family History, and Social History were reviewed in [employer] record.  ROS  The following are not active complaints unless bolded sore throat, dysphagia, dental problems, itching, sneezing,  nasal congestion or excess/ purulent secretions, ear ache,   fever, chills, sweats,  unintended wt loss, pleuritic or exertional cp, hemoptysis,  orthopnea pnd or leg swelling, presyncope, palpitations, abdominal pain, anorexia, nausea, vomiting, diarrhea  or change in bowel or urinary habits, change in stools or urine, dysuria,hematuria,  rash, arthralgias, visual complaints, headache, numbness weakness or ataxia or problems with walking or coordination,  change in mood/affect or memory.              Past Medical History:   LYMPHOMA NEC, MLIG,  INGUINAL/LOWER LIMB (ICD-202.85) dx 04/2005.......[facility]  - last chemo 10/2006 -repeat CT/ABD CT 11/18/07--no reccurence  - Pancytopenia August 06, 2009 > refer back to [facility] > aranesp rx Jan 2012  RHINITIS, ALLERGIC NOS (ICD-477.9)  HYPERLIPIDEMIA NEC/NOS (ICD-272.4)  EXOGENOUS OBESITY (ICD-278.00)  - ideal body weight less than 186  AODM onset 2012 with freq pred for airways issues -HgA1C 5.5 12/12 >metformin decreased 500mg  1/2 Twice daily   Vitamin D Deficiency- level 29>>44 (4/9//10)  Left Hip pain onset around 6/09............................................................................   [facility]  - MRI 11/23/2007 c/w L1-2 bulging disc indents the thecal sac with foraminal stenosis  HEALTH MAINTENANCE..........................................................................................Marland KitchenWert  - Td 10/2005  - Pneumovax 10/07 second shot  - CPX 08/20/2010  --colon 02/2005 -int/extern. hems (repeat q7y)  Complex med regimen  --Meds reviewed with pt education and computerized med calendar completed/adjusted. November 08, 2008 , August 21, 2009 updated 02/19/2011 , 10/23/2011 , 09/19/2013  Syncope...........................................................................................................[facility] KitchenHochrein  - Mobitz II AV block s/p Medtronic pacemaker placement 12/11/2008  [facility]at[facility].....................................................................................................[facility]  - Pruritic rash 04/2009 > tried lotrisone, referred to [facility] August 06, 2009  Memory impairment , MMSE 27/30 8/25>refer to neuro   Family History:   mother had diabetes   Social History:  Patient states former smoker.  quit smoking in 1970  No ETOH  Retired            Objective:   Physical Exam wt 244 January 16, 2008 > 249 April 24, 2009  >07/03/2011 246 >  12/18/2013 231 >227 02/20/2014 > 07/18/2014   242 >249 9/22    amb obese wm in no acute distress / vital signs  reviewed  HEENT: nl dentition and orophanx.  Nasal mucosa dry, Mucus membranes pink and moist. No tongue or throat exudate.  Neck: supple with full ROM. no JVD, node enlargement or TMJ   Lungs: clear and equal bilateral breath sounds.diminished in bases  Chest: RRR. No murmurs, rubs, or gallops.   Tr- peripheral edema.  Abd: obese but soft and no rebound or focal tenderness. Normal excursion in the supine position.  Ext warm and dry, no calf tenderness, cyanosis or clubbing. mild/mod chronic venous insufficiency changes. Varicose veins present. Neuro: nl sensorium .[name] Gets up from chair fine without hands, sym strength both legs, nml grips, CN 2-12 intact , no facial droop   Skin:   No rash     CXR PA and Lateral:   07/18/2014 :      No active disease.  Labs ordered/ reviewed:   Lab 07/18/14 1408  NA 136  K 3.8  CL 99  CO2 29  BUN 14  CREATININE 0.85  GLUCOSE 142*      Lab 07/20/14 0913  HGB 9.7*  HCT 27.9*  WBC 1.3*  PLT 66*                     Assessment:

## 2014-10-24 ENCOUNTER — Ambulatory Visit (HOSPITAL_BASED_OUTPATIENT_CLINIC_OR_DEPARTMENT_OTHER): Payer: Medicare Other

## 2014-10-24 ENCOUNTER — Other Ambulatory Visit: Payer: Self-pay | Admitting: *Deleted

## 2014-10-24 ENCOUNTER — Other Ambulatory Visit (HOSPITAL_BASED_OUTPATIENT_CLINIC_OR_DEPARTMENT_OTHER): Payer: Medicare Other

## 2014-10-24 VITALS — BP 117/60 | HR 77 | Temp 98.2°F | Resp 20

## 2014-10-24 DIAGNOSIS — D631 Anemia in chronic kidney disease: Secondary | ICD-10-CM

## 2014-10-24 DIAGNOSIS — N189 Chronic kidney disease, unspecified: Secondary | ICD-10-CM

## 2014-10-24 DIAGNOSIS — D469 Myelodysplastic syndrome, unspecified: Secondary | ICD-10-CM

## 2014-10-24 DIAGNOSIS — D508 Other iron deficiency anemias: Secondary | ICD-10-CM

## 2014-10-24 DIAGNOSIS — D649 Anemia, unspecified: Secondary | ICD-10-CM | POA: Diagnosis not present

## 2014-10-24 DIAGNOSIS — D61818 Other pancytopenia: Secondary | ICD-10-CM

## 2014-10-24 LAB — CBC WITH DIFFERENTIAL/PLATELET
BASO%: 0.3 % (ref 0.0–2.0)
Basophils Absolute: 0 10*3/uL (ref 0.0–0.1)
EOS%: 0.9 % (ref 0.0–7.0)
Eosinophils Absolute: 0 10*3/uL (ref 0.0–0.5)
HEMATOCRIT: 24.6 % — AB (ref 38.4–49.9)
HGB: 8.5 g/dL — ABNORMAL LOW (ref 13.0–17.1)
LYMPH#: 0.8 10*3/uL — AB (ref 0.9–3.3)
LYMPH%: 64.9 % — ABNORMAL HIGH (ref 14.0–49.0)
MCH: 36.6 pg — ABNORMAL HIGH (ref 27.2–33.4)
MCHC: 34.3 g/dL (ref 32.0–36.0)
MCV: 106.5 fL — ABNORMAL HIGH (ref 79.3–98.0)
MONO#: 0 10*3/uL — AB (ref 0.1–0.9)
MONO%: 0.7 % (ref 0.0–14.0)
NEUT%: 33.2 % — AB (ref 39.0–75.0)
NEUTROS ABS: 0.4 10*3/uL — AB (ref 1.5–6.5)
PLATELETS: 78 10*3/uL — AB (ref 140–400)
RBC: 2.31 10*6/uL — ABNORMAL LOW (ref 4.20–5.82)
RDW: 26.2 % — AB (ref 11.0–14.6)
WBC: 1.2 10*3/uL — AB (ref 4.0–10.3)

## 2014-10-24 LAB — PREPARE RBC (CROSSMATCH)

## 2014-10-24 LAB — HOLD TUBE, BLOOD BANK

## 2014-10-24 MED ORDER — DIPHENHYDRAMINE HCL 25 MG PO CAPS
25.0000 mg | ORAL_CAPSULE | Freq: Once | ORAL | Status: AC
  Administered 2014-10-24: 25 mg via ORAL

## 2014-10-24 MED ORDER — DARBEPOETIN ALFA 300 MCG/0.6ML IJ SOSY
300.0000 ug | PREFILLED_SYRINGE | Freq: Once | INTRAMUSCULAR | Status: DC
  Filled 2014-10-24: qty 0.6

## 2014-10-24 MED ORDER — HEPARIN SOD (PORK) LOCK FLUSH 100 UNIT/ML IV SOLN
500.0000 [IU] | Freq: Every day | INTRAVENOUS | Status: DC | PRN
  Filled 2014-10-24: qty 5

## 2014-10-24 MED ORDER — DIPHENHYDRAMINE HCL 25 MG PO CAPS
ORAL_CAPSULE | ORAL | Status: AC
  Filled 2014-10-24: qty 1

## 2014-10-24 MED ORDER — ACETAMINOPHEN 325 MG PO TABS
650.0000 mg | ORAL_TABLET | Freq: Once | ORAL | Status: AC
  Administered 2014-10-24: 650 mg via ORAL

## 2014-10-24 MED ORDER — DARBEPOETIN ALFA 300 MCG/0.6ML IJ SOSY
300.0000 ug | PREFILLED_SYRINGE | Freq: Once | INTRAMUSCULAR | Status: AC
  Administered 2014-10-24: 300 ug via SUBCUTANEOUS
  Filled 2014-10-24: qty 0.6

## 2014-10-24 MED ORDER — ACETAMINOPHEN 325 MG PO TABS
ORAL_TABLET | ORAL | Status: AC
  Filled 2014-10-24: qty 2

## 2014-10-24 MED ORDER — SODIUM CHLORIDE 0.9 % IV SOLN
250.0000 mL | Freq: Once | INTRAVENOUS | Status: AC
Start: 2014-10-24 — End: 2014-10-24
  Administered 2014-10-24: 250 mL via INTRAVENOUS

## 2014-10-24 NOTE — Patient Instructions (Signed)

## 2014-10-25 ENCOUNTER — Ambulatory Visit (INDEPENDENT_AMBULATORY_CARE_PROVIDER_SITE_OTHER)
Admission: RE | Admit: 2014-10-25 | Discharge: 2014-10-25 | Disposition: A | Discharge status: Home / Self Care | Payer: Medicare Other | Source: Ambulatory Visit | Attending: Adult Health | Admitting: Adult Health

## 2014-10-25 DIAGNOSIS — R51 Headache: Secondary | ICD-10-CM

## 2014-10-25 DIAGNOSIS — R519 Headache, unspecified: Secondary | ICD-10-CM

## 2014-10-25 LAB — TYPE AND SCREEN
ABO/RH(D): A POS
ANTIBODY SCREEN: NEGATIVE
UNIT DIVISION: 0

## 2014-10-26 ENCOUNTER — Other Ambulatory Visit: Payer: Self-pay | Admitting: Internal Medicine

## 2014-10-29 ENCOUNTER — Ambulatory Visit (HOSPITAL_COMMUNITY)
Admission: RE | Admit: 2014-10-29 | Discharge: 2014-10-29 | Disposition: A | Discharge status: Home / Self Care | Payer: Medicare Other | Source: Ambulatory Visit | Attending: Oncology | Admitting: Oncology

## 2014-10-29 DIAGNOSIS — D649 Anemia, unspecified: Secondary | ICD-10-CM

## 2014-10-29 DIAGNOSIS — D469 Myelodysplastic syndrome, unspecified: Secondary | ICD-10-CM | POA: Insufficient documentation

## 2014-10-29 DIAGNOSIS — D61818 Other pancytopenia: Secondary | ICD-10-CM

## 2014-11-01 ENCOUNTER — Telehealth: Payer: Self-pay | Admitting: Internal Medicine

## 2014-11-01 NOTE — Telephone Encounter (Signed)
Per other 10.6.16 phone note: Randa Spike, CMA at 11/01/2014 9:23 AM     Status: Signed       Expand All Collapse All    Result Notes     Notes Recorded by Melvenia Needles, NP on 10/29/2014 at 10:16 PM CT head neg for acute process  Chronic changes noted  Cont w/ ov recs  Please contact office for sooner follow up if symptoms do not improve or worsen or seek emergency ca   Pt's daughter is aware of results. Nothing further was needed       LMOM TCB x1 for pt's daughter [name]

## 2014-11-01 NOTE — Telephone Encounter (Signed)
Result Notes     Notes Recorded by Melvenia Needles, NP on 10/29/2014 at 10:16 PM CT head neg for acute process  Chronic changes noted  Cont w/ ov recs  Please contact office for sooner follow up if symptoms do not improve or worsen or seek emergency ca   Pt's daughter is aware of results. Nothing further was needed.

## 2014-11-01 NOTE — Telephone Encounter (Signed)
Pt's daughter [name] returned call [name] is questioning "chronic changes noted" She is requesting some additional insight on this from [place] please advise, thank you!

## 2014-11-02 ENCOUNTER — Encounter: Payer: Self-pay | Admitting: Adult Health

## 2014-11-02 NOTE — Telephone Encounter (Signed)
LMTCB , call both numbers with no answer.  Can let them know the chronic changes on CT are most likely age related with hardening of the arteries as people get older.  No sign of stroke or tumor Cont w/ ov recs  Please contact office for sooner follow up if symptoms do not improve or worsen or seek emergency care

## 2014-11-05 ENCOUNTER — Telehealth: Payer: Self-pay | Admitting: Adult Health

## 2014-11-05 DIAGNOSIS — G4734 Idiopathic sleep related nonobstructive alveolar hypoventilation: Secondary | ICD-10-CM

## 2014-11-05 NOTE — Telephone Encounter (Signed)
Received recs and results of ONO per TP  I will call and reivew results with pt and chart in a new telephone note Nothing further needed

## 2014-11-05 NOTE — Telephone Encounter (Signed)
Called and LMTCB for [employer]

## 2014-11-05 NOTE — Telephone Encounter (Signed)
Pt daughter is aware of below. She verbalized understanding. She is requesting the ONO results done last Wednesday.  Please advise Estill Bamberg if this was received thanks

## 2014-11-05 NOTE — Telephone Encounter (Signed)
Per TP: Positive ONO  Okay to begin O2 2L of O2 Qhs  Called and spoke with's pt's daughter per previous phone note on 10.10.16  Reviewed results and recs. Pt's daughter voiced understanding and had no further questions Order for 2L of O2 Qhs was placed  Nothing further need Will sign off on message

## 2014-11-07 ENCOUNTER — Ambulatory Visit: Payer: Medicare Other

## 2014-11-07 ENCOUNTER — Other Ambulatory Visit: Payer: Self-pay | Admitting: *Deleted

## 2014-11-07 ENCOUNTER — Ambulatory Visit (HOSPITAL_BASED_OUTPATIENT_CLINIC_OR_DEPARTMENT_OTHER): Payer: Medicare Other

## 2014-11-07 ENCOUNTER — Other Ambulatory Visit (HOSPITAL_BASED_OUTPATIENT_CLINIC_OR_DEPARTMENT_OTHER): Payer: Medicare Other

## 2014-11-07 VITALS — BP 120/63 | HR 71 | Temp 98.0°F | Resp 18

## 2014-11-07 DIAGNOSIS — D61818 Other pancytopenia: Secondary | ICD-10-CM

## 2014-11-07 DIAGNOSIS — D649 Anemia, unspecified: Secondary | ICD-10-CM

## 2014-11-07 DIAGNOSIS — D469 Myelodysplastic syndrome, unspecified: Secondary | ICD-10-CM

## 2014-11-07 DIAGNOSIS — D631 Anemia in chronic kidney disease: Secondary | ICD-10-CM

## 2014-11-07 DIAGNOSIS — N189 Chronic kidney disease, unspecified: Secondary | ICD-10-CM

## 2014-11-07 LAB — CBC WITH DIFFERENTIAL/PLATELET
BASO%: 0 % (ref 0.0–2.0)
Basophils Absolute: 0 10*3/uL (ref 0.0–0.1)
EOS%: 0.9 % (ref 0.0–7.0)
Eosinophils Absolute: 0 10*3/uL (ref 0.0–0.5)
HCT: 24.2 % — ABNORMAL LOW (ref 38.4–49.9)
HEMOGLOBIN: 8.3 g/dL — AB (ref 13.0–17.1)
LYMPH#: 0.7 10*3/uL — AB (ref 0.9–3.3)
LYMPH%: 65.8 % — ABNORMAL HIGH (ref 14.0–49.0)
MCH: 36.2 pg — ABNORMAL HIGH (ref 27.2–33.4)
MCHC: 34.3 g/dL (ref 32.0–36.0)
MCV: 105.7 fL — ABNORMAL HIGH (ref 79.3–98.0)
MONO#: 0 10*3/uL — ABNORMAL LOW (ref 0.1–0.9)
MONO%: 0.9 % (ref 0.0–14.0)
NEUT%: 32.4 % — ABNORMAL LOW (ref 39.0–75.0)
NEUTROS ABS: 0.4 10*3/uL — AB (ref 1.5–6.5)
Platelets: 67 10*3/uL — ABNORMAL LOW (ref 140–400)
RBC: 2.29 10*6/uL — ABNORMAL LOW (ref 4.20–5.82)
WBC: 1.1 10*3/uL — AB (ref 4.0–10.3)

## 2014-11-07 LAB — HOLD TUBE, BLOOD BANK

## 2014-11-07 LAB — PREPARE RBC (CROSSMATCH)

## 2014-11-07 MED ORDER — SODIUM CHLORIDE 0.9 % IV SOLN
250.0000 mL | Freq: Once | INTRAVENOUS | Status: AC
  Administered 2014-11-07: 250 mL via INTRAVENOUS

## 2014-11-07 MED ORDER — ACETAMINOPHEN 325 MG PO TABS
ORAL_TABLET | ORAL | Status: AC
  Filled 2014-11-07: qty 2

## 2014-11-07 MED ORDER — DIPHENHYDRAMINE HCL 25 MG PO CAPS
25.0000 mg | ORAL_CAPSULE | Freq: Once | ORAL | Status: AC
Start: 2014-11-07 — End: 2014-11-07
  Administered 2014-11-07: 25 mg via ORAL

## 2014-11-07 MED ORDER — DARBEPOETIN ALFA 300 MCG/0.6ML IJ SOSY
300.0000 ug | PREFILLED_SYRINGE | Freq: Once | INTRAMUSCULAR | Status: AC
  Administered 2014-11-07: 300 ug via SUBCUTANEOUS
  Filled 2014-11-07: qty 0.6

## 2014-11-07 MED ORDER — ACETAMINOPHEN 325 MG PO TABS
650.0000 mg | ORAL_TABLET | Freq: Once | ORAL | Status: AC
  Administered 2014-11-07: 650 mg via ORAL

## 2014-11-07 MED ORDER — DIPHENHYDRAMINE HCL 25 MG PO CAPS
ORAL_CAPSULE | ORAL | Status: AC
  Filled 2014-11-07: qty 1

## 2014-11-07 NOTE — Patient Instructions (Signed)

## 2014-11-07 NOTE — Progress Notes (Unsigned)
Aranesp to be given by Val, infusion nurse since pt will be receiving blood transfusion today.

## 2014-11-08 LAB — TYPE AND SCREEN
ABO/RH(D): A POS
Antibody Screen: NEGATIVE
UNIT DIVISION: 0
UNIT DIVISION: 0

## 2014-11-21 ENCOUNTER — Ambulatory Visit (HOSPITAL_BASED_OUTPATIENT_CLINIC_OR_DEPARTMENT_OTHER): Payer: Medicare Other

## 2014-11-21 ENCOUNTER — Ambulatory Visit: Payer: Medicare Other

## 2014-11-21 ENCOUNTER — Telehealth: Payer: Self-pay | Admitting: *Deleted

## 2014-11-21 ENCOUNTER — Other Ambulatory Visit (HOSPITAL_BASED_OUTPATIENT_CLINIC_OR_DEPARTMENT_OTHER): Payer: Medicare Other

## 2014-11-21 ENCOUNTER — Ambulatory Visit (HOSPITAL_BASED_OUTPATIENT_CLINIC_OR_DEPARTMENT_OTHER): Payer: Medicare Other | Admitting: Oncology

## 2014-11-21 ENCOUNTER — Telehealth: Payer: Self-pay | Admitting: Oncology

## 2014-11-21 VITALS — BP 122/54 | HR 72 | Temp 98.2°F | Resp 20

## 2014-11-21 VITALS — BP 121/54 | HR 115 | Temp 97.9°F | Resp 18 | Ht 71.0 in | Wt 248.6 lb

## 2014-11-21 DIAGNOSIS — D472 Monoclonal gammopathy: Secondary | ICD-10-CM

## 2014-11-21 DIAGNOSIS — D61818 Other pancytopenia: Secondary | ICD-10-CM | POA: Diagnosis not present

## 2014-11-21 DIAGNOSIS — D631 Anemia in chronic kidney disease: Secondary | ICD-10-CM

## 2014-11-21 DIAGNOSIS — N189 Chronic kidney disease, unspecified: Secondary | ICD-10-CM

## 2014-11-21 DIAGNOSIS — D469 Myelodysplastic syndrome, unspecified: Secondary | ICD-10-CM

## 2014-11-21 DIAGNOSIS — Z8572 Personal history of non-Hodgkin lymphomas: Secondary | ICD-10-CM | POA: Diagnosis not present

## 2014-11-21 LAB — CBC WITH DIFFERENTIAL/PLATELET
BASO%: 0 % (ref 0.0–2.0)
BASOS ABS: 0 10*3/uL (ref 0.0–0.1)
EOS ABS: 0 10*3/uL (ref 0.0–0.5)
EOS%: 0 % (ref 0.0–7.0)
HEMATOCRIT: 25 % — AB (ref 38.4–49.9)
HEMOGLOBIN: 8.5 g/dL — AB (ref 13.0–17.1)
LYMPH#: 0.7 10*3/uL — AB (ref 0.9–3.3)
LYMPH%: 60 % — ABNORMAL HIGH (ref 14.0–49.0)
MCH: 35.4 pg — AB (ref 27.2–33.4)
MCHC: 34 g/dL (ref 32.0–36.0)
MCV: 104.2 fL — AB (ref 79.3–98.0)
MONO#: 0 10*3/uL — ABNORMAL LOW (ref 0.1–0.9)
MONO%: 0.9 % (ref 0.0–14.0)
NEUT#: 0.5 10*3/uL — CL (ref 1.5–6.5)
NEUT%: 39.1 % (ref 39.0–75.0)
Platelets: 67 10*3/uL — ABNORMAL LOW (ref 140–400)
RBC: 2.4 10*6/uL — ABNORMAL LOW (ref 4.20–5.82)
RDW: 22.7 % — AB (ref 11.0–14.6)
WBC: 1.2 10*3/uL — ABNORMAL LOW (ref 4.0–10.3)

## 2014-11-21 LAB — PREPARE RBC (CROSSMATCH)

## 2014-11-21 LAB — HOLD TUBE, BLOOD BANK

## 2014-11-21 MED ORDER — ACETAMINOPHEN 325 MG PO TABS
650.0000 mg | ORAL_TABLET | Freq: Once | ORAL | Status: AC
  Administered 2014-11-21: 650 mg via ORAL

## 2014-11-21 MED ORDER — DIPHENHYDRAMINE HCL 25 MG PO CAPS
ORAL_CAPSULE | ORAL | Status: AC
  Filled 2014-11-21: qty 1

## 2014-11-21 MED ORDER — FUROSEMIDE 10 MG/ML IJ SOLN
INTRAMUSCULAR | Status: AC
  Filled 2014-11-21: qty 2

## 2014-11-21 MED ORDER — FUROSEMIDE 10 MG/ML IJ SOLN
20.0000 mg | Freq: Once | INTRAMUSCULAR | Status: AC
  Administered 2014-11-21: 20 mg via INTRAVENOUS

## 2014-11-21 MED ORDER — DIPHENHYDRAMINE HCL 25 MG PO CAPS
25.0000 mg | ORAL_CAPSULE | Freq: Once | ORAL | Status: AC
  Administered 2014-11-21: 25 mg via ORAL

## 2014-11-21 MED ORDER — ACETAMINOPHEN 325 MG PO TABS
ORAL_TABLET | ORAL | Status: AC
  Filled 2014-11-21: qty 2

## 2014-11-21 MED ORDER — DARBEPOETIN ALFA 300 MCG/0.6ML IJ SOSY
300.0000 ug | PREFILLED_SYRINGE | Freq: Once | INTRAMUSCULAR | Status: AC
Start: 2014-11-21 — End: 2014-11-21
  Administered 2014-11-21: 300 ug via SUBCUTANEOUS
  Filled 2014-11-21: qty 0.6

## 2014-11-21 MED ORDER — SODIUM CHLORIDE 0.9 % IV SOLN
250.0000 mL | Freq: Once | INTRAVENOUS | Status: AC
  Administered 2014-11-21: 250 mL via INTRAVENOUS

## 2014-11-21 NOTE — Patient Instructions (Signed)

## 2014-11-21 NOTE — Progress Notes (Signed)
Aranesp injection given by the infusion nurse after blood transfusion

## 2014-11-21 NOTE — Telephone Encounter (Signed)
Per staff message and POF I have scheduled appts. Advised scheduler of appts. JMW  

## 2014-11-21 NOTE — Progress Notes (Signed)
Hematology and Oncology Follow Up Visit  ZAKAREE MCCLENAHAN [id number] [birth year] 81 y.o. 11/21/2014 9:37 AM  CC: [name]. [name], MD, FCCP    Principle Diagnosis: A 81 year old gentleman with the following diagnoses:   1. Follicular lymphoma, grade 3, stage III, diagnosed 2007. Continued to be in remission. 2. Pancytopenia associated with mild neutropenia and macrocytosis possibly indicating early myelodysplasia. Bone marrow  was repeated on 06/22/2012 confirmed the evidence of myelodysplastic syndrome 3. Monoclonal gammopathy of undetermined significance.  Prior Therapy:  1. Status post CHOP and rituximab.  He had complete response to therapy concluded in 2007.  No further imaging has been indicated at this time. 2. Status post bone marrow biopsy in April 2011, did not really show any evidence of myelodysplasia or metastatic lymphoma at this time.  Current therapy: Aranesp 300 mcg every 2weeks to keep his hemoglobin close to 11. He also has been receiving intermittent PRBC transfusions to keep Hgb above 9.  Interim History: Mr. Bischof presents today for a followup visit with his daughter. Since his last visit, he continues to do about the same and relatively stable. He is currently using oxygen at home given his nighttime hypoxia. His mobility is limited but fairly stable. He does use a cane and drives short distances.   He is not reporting any chest pain or dyspnea on exertion today. He has not reported any bleeding complications such as epistaxis or hematochezia. Has not reported any melena. Appetite remains reasonable and his quality of life is unchanged.  He has not noticed any further decline in his energy her performance status.  He does not report any dysphagia.  He does not report any lymphadenopathy. Denies night sweats. He reports no syncope or falls. He did not report any headaches or change in his vision. Does not report any seizures or confusion. He does not report any palpitation  or orthopnea. Has not reported any edema. Does not report any wheezing but still has exertional dyspnea.  He does not report any lymphadenopathy or petechiae. Has not reported any skin rashes or genitourinary complaints. Remainder of review of systems unremarkable.   Medications: I have reviewed the patient's current medications Current Outpatient Prescriptions  Medication Sig Dispense Refill  . albuterol (PROVENTIL HFA;VENTOLIN HFA) 108 (90 BASE) MCG/ACT inhaler Inhale 1-2 puffs into the lungs every 4 (four) hours as needed for wheezing or shortness of breath. (Patient taking differently: Take 2 puffs by mouth every 4-6 hours as needed for wheezing and shortness of breath) 8 g 5  . amoxicillin-clavulanate (AUGMENTIN) 875-125 MG per tablet Take 1 tablet by mouth 2 (two) times daily. (Patient not taking: Reported on 10/18/2014) 20 tablet 0  . aspirin 81 MG tablet Take 81 mg by mouth every morning. (hold if bleeding)    . calcium-vitamin D (OS-CAL 500 + D) 500-200 MG-UNIT per tablet Take 1 tablet by mouth 2 (two) times daily.     . cetirizine (ZYRTEC) 10 MG tablet Take 10 mg by mouth at bedtime as needed (for drippy nose).     . Cholecalciferol (VITAMIN D) 1000 UNITS capsule Take 1,000 Units by mouth 2 (two) times daily.      [name] dextromethorphan-guaiFENesin (MUCINEX DM) 30-600 MG per 12 hr tablet Take 1 tablet by mouth every 12 (twelve) hours as needed for cough.     . docusate sodium (COLACE) 100 MG capsule Take 100 mg by mouth daily as needed for moderate constipation.     . DULERA 200-5 MCG/ACT AERO INHALE 2  PUFFS FIRST THING EVERY MORNING AND THEN ANOTHER 2 PUFFS ABOUT 12 HOURS LATER (Patient not taking: Reported on 10/18/2014) 1 Inhaler 5  . famotidine (PEPCID) 20 MG tablet Take 20 mg by mouth at bedtime.     . finasteride (PROSCAR) 5 MG tablet Take 5 mg by mouth every morning.     . furosemide (LASIX) 40 MG tablet TAKE 1 TABLET BY MOUTH DAILY. MAY TAKE 1 EXTRA TABLET DAILY IF NEEDED FOR SWELLING IN  LEGS 30 tablet 5  . hydrocortisone cream 1 % Apply 1 application topically 2 (two) times daily as needed (rash or itchy skin).     . hydrOXYzine (ATARAX/VISTARIL) 25 MG tablet Take 1-2 tablets by mouth every 4-6 hours as needed for itching    . KLOR-CON M20 20 MEQ tablet TAKE 1 TABLET BY MOUTH EVERY DAY (Patient taking differently: TAKE 1 TABLET BY MOUTH EVERY MORNING) 30 tablet 5  . levothyroxine (SYNTHROID, LEVOTHROID) 25 MCG tablet TAKE 1 TABLET BY MOUTH EVERY MORNING BEFORE BREAKFAST 30 tablet 3  . metFORMIN (GLUCOPHAGE) 500 MG tablet TAKE 1 TABLET BY MOUTH TWICE A DAY WITH FOOD (Patient taking differently: TAKE 1/2 TABLET BY MOUTH TWICE A DAY AFTER MEALS) 60 tablet 5  . methylcellulose (CITRUCEL) oral powder Take 1 packet by mouth daily.     . mometasone (NASONEX) 50 MCG/ACT nasal spray Use 0-2 puffs every morning and 1-2 puffs every evening    . mometasone-formoterol (DULERA) 200-5 MCG/ACT AERO Inhale 2 puffs into the lungs 2 (two) times daily.    . Multiple Vitamin (MULTIVITAMIN) tablet Take 1 tablet by mouth daily.      [name] NASONEX 50 MCG/ACT nasal spray USE 2 SPRAYS NASALLY TWICE DAILY 17 g 11  . nitroGLYCERIN (NITROSTAT) 0.4 MG SL tablet Place 1 tablet (0.4 mg total) under the tongue every 5 (five) minutes as needed for chest pain. 90 tablet 3  . Nutritional Supplements (ADVANCE MULTI-VITAMIN/MINERALS PO) Take 1 tablet by mouth every morning.    [name] omeprazole (PRILOSEC) 20 MG capsule Take 40 mg by mouth daily before breakfast.     . oxybutynin (DITROPAN-XL) 10 MG 24 hr tablet Take 10 mg by mouth every morning.     . polyethylene glycol powder (MIRALAX) powder Take 17 g by mouth daily as needed for mild constipation or moderate constipation.     . predniSONE (DELTASONE) 10 MG tablet Take  4 each am x 2 days,   2 each am x 2 days,  1 each am x 2 days and stop (Patient not taking: Reported on 10/18/2014) 14 tablet 0  . sulindac (CLINORIL) 200 MG tablet TAKE 1 TABLET BY MOUTH TWICE A DAY (Patient  not taking: Reported on 10/18/2014) 60 tablet 5  . sulindac (CLINORIL) 200 MG tablet Take 1 tablet (200 mg total) by mouth 2 (two) times daily as needed (pain). (Patient taking differently: Take 1 tablet by mouth twice a day with food as needed for arthritis pain, hip, and back pain) 60 tablet 5  . traMADol (ULTRAM) 50 MG tablet Take 1/2 to one  tablet by mouth every 4 hours as needed for severe pain or cough 40 tablet 0  . traZODone (DESYREL) 150 MG tablet TAKE 1/3 TABLET BY MOUTH AT BEDTIME 30 tablet 3   No current facility-administered medications for this visit.     Allergies: No Known Allergies  Past Medical History, Surgical history, Social history, and Family History were reviewed and updated.    Physical Exam: Blood pressure 121/54,  pulse 115, temperature 97.9 F (36.6 C), temperature source Oral, resp. rate 18, height 5' 11"  (1.803 m), weight 248 lb 9.6 oz (112.764 kg), SpO2 99 %. ECOG: 1 General appearance: alert, chronically ill-appearing elderly gentleman without distress. Head: Normocephalic, without obvious abnormality oral ulcers or lesions. Neck: no adenopathy. No palpable masses. Lymph nodes: Cervical, supraclavicular, and axillary nodes normal. Heart:regular rate and rhythm, S1, S2 normal, no murmur, click, rub or gallop Lung:chest clear, no wheezing, rales, normal symmetric air entry. Abdomen: soft, non-tender, without masses or organomegaly. No ascites or shifting dullness. No rebound or guarding. EXT:no erythema, induration, or nodules. Trace edema noted. Skin: No rashes or lesions noted.    Lab Results: Lab Results  Component Value Date   WBC 1.2* 11/21/2014   HGB 8.5* 11/21/2014   HCT 25.0* 11/21/2014   MCV 104.2* 11/21/2014   PLT 67* 11/21/2014     Chemistry      Component Value Date/Time   NA 136 07/18/2014 1408   NA 137 01/05/2013 1501   K 3.8 07/18/2014 1408   K 3.8 01/05/2013 1501   CL 99 07/18/2014 1408   CL 99 06/08/2012 1257   CO2 29  07/18/2014 1408   CO2 26 01/05/2013 1501   BUN 14 07/18/2014 1408   BUN 13.9 01/05/2013 1501   CREATININE 0.85 07/18/2014 1408   CREATININE 0.8 01/05/2013 1501      Component Value Date/Time   CALCIUM 9.7 07/18/2014 1408   CALCIUM 10.1 01/05/2013 1501   ALKPHOS 82 12/06/2012 1850   ALKPHOS 48 08/10/2012 1117   AST 17 12/06/2012 1850   AST 17 08/10/2012 1117   ALT <5 12/06/2012 1850   ALT 14 08/10/2012 1117   BILITOT 0.7 12/06/2012 1850   BILITOT 0.82 08/10/2012 1117      Impression and Plan:  This is a 81 year old gentleman with the following issues:  1. Follicular lymphoma diagnosed in 2007. He has no evidence to suggest recurrent disease. 2. Pancytopenia due to myelodysplastic syndrome. He is status post bone marrow biopsy was done on 06/22/2012.  He is to continue on Aranesp 300 mcg every 2 weeks as well as intermittent transfusions. His hemoglobin is 8.5 and he is symptomatic and will require 2 units of packed red cell transfusions. He will continue on Aranesp as well. 3. Neutropenia: Neutropenic precautions were given to the patient today. No need for any growth factor support since he has not had any clinical infections. Will continue to monitor closely. 4. Monoclonal gammopathy.  His M-spike had been less than 1 g/dL, his bone marrow biopsy continued to show a less than 8% plasma cells indicating most likely multiple myeloma.  This will be repeated in 4 months. 5. Thrombocytopenia: Likely related to his myelodysplasia without any signs or symptoms of bleeding. 6. IV access: Risks and benefits of Port-A-Cath insertion were reviewed. Complications such as bleeding, infection and pain were all reviewed. Also risk of thrombosis related to the catheter was also discussed. He had a Port-A-Cath in the past during his lymphoma treatment and agreeable to proceed with Port-A-Cath insertion the near future. Referral to interventional radiology has been made already. 7. Followup every 2  weeks for an injection and possible transfusion. He will have a clinical visit in 8 weeks.  [facility], MD 10/26/20169:37 AM

## 2014-11-21 NOTE — Telephone Encounter (Signed)
per pof to sch pt appt-gave pt copy of avs-sent MW email to sch blood-pt will get updated copy @ next appt

## 2014-11-22 LAB — TYPE AND SCREEN
ABO/RH(D): A POS
ANTIBODY SCREEN: NEGATIVE
UNIT DIVISION: 0
Unit division: 0

## 2014-11-27 ENCOUNTER — Encounter: Payer: Self-pay | Admitting: Adult Health

## 2014-11-27 ENCOUNTER — Other Ambulatory Visit: Payer: Self-pay | Admitting: Radiology

## 2014-11-27 ENCOUNTER — Ambulatory Visit (HOSPITAL_COMMUNITY)
Admission: RE | Admit: 2014-11-27 | Discharge: 2014-11-27 | Disposition: A | Discharge status: Home / Self Care | Payer: Medicare Other | Source: Ambulatory Visit | Attending: Oncology | Admitting: Oncology

## 2014-11-27 DIAGNOSIS — D469 Myelodysplastic syndrome, unspecified: Secondary | ICD-10-CM

## 2014-11-28 ENCOUNTER — Ambulatory Visit (HOSPITAL_COMMUNITY)
Admission: RE | Admit: 2014-11-28 | Discharge: 2014-11-28 | Disposition: A | Discharge status: Home / Self Care | Payer: Medicare Other | Source: Ambulatory Visit | Attending: Oncology | Admitting: Oncology

## 2014-11-28 ENCOUNTER — Encounter (HOSPITAL_COMMUNITY): Payer: Self-pay

## 2014-11-28 ENCOUNTER — Other Ambulatory Visit: Payer: Self-pay | Admitting: Oncology

## 2014-11-28 DIAGNOSIS — E785 Hyperlipidemia, unspecified: Secondary | ICD-10-CM | POA: Diagnosis not present

## 2014-11-28 DIAGNOSIS — I872 Venous insufficiency (chronic) (peripheral): Secondary | ICD-10-CM | POA: Diagnosis not present

## 2014-11-28 DIAGNOSIS — E119 Type 2 diabetes mellitus without complications: Secondary | ICD-10-CM | POA: Diagnosis not present

## 2014-11-28 DIAGNOSIS — Z7984 Long term (current) use of oral hypoglycemic drugs: Secondary | ICD-10-CM | POA: Diagnosis not present

## 2014-11-28 DIAGNOSIS — M1990 Unspecified osteoarthritis, unspecified site: Secondary | ICD-10-CM | POA: Insufficient documentation

## 2014-11-28 DIAGNOSIS — I509 Heart failure, unspecified: Secondary | ICD-10-CM | POA: Diagnosis not present

## 2014-11-28 DIAGNOSIS — Z87891 Personal history of nicotine dependence: Secondary | ICD-10-CM | POA: Insufficient documentation

## 2014-11-28 DIAGNOSIS — D469 Myelodysplastic syndrome, unspecified: Secondary | ICD-10-CM

## 2014-11-28 DIAGNOSIS — Z7982 Long term (current) use of aspirin: Secondary | ICD-10-CM | POA: Diagnosis not present

## 2014-11-28 DIAGNOSIS — D61818 Other pancytopenia: Secondary | ICD-10-CM | POA: Insufficient documentation

## 2014-11-28 DIAGNOSIS — I480 Paroxysmal atrial fibrillation: Secondary | ICD-10-CM | POA: Diagnosis not present

## 2014-11-28 LAB — BASIC METABOLIC PANEL
Anion gap: 9 (ref 5–15)
BUN: 15 mg/dL (ref 6–20)
CHLORIDE: 98 mmol/L — AB (ref 101–111)
CO2: 28 mmol/L (ref 22–32)
Calcium: 9.8 mg/dL (ref 8.9–10.3)
Creatinine, Ser: 0.73 mg/dL (ref 0.61–1.24)
GFR calc non Af Amer: >60 mL/min (ref >60)
Glucose, Bld: 130 mg/dL — ABNORMAL HIGH (ref 65–99)
POTASSIUM: 4 mmol/L (ref 3.5–5.1)
SODIUM: 135 mmol/L (ref 135–145)

## 2014-11-28 LAB — PROTIME-INR
INR: 1.05 (ref 0.00–1.49)
Prothrombin Time: 13.9 seconds (ref 11.6–15.2)

## 2014-11-28 LAB — CBC
HCT: 30.3 % — ABNORMAL LOW (ref 39.0–52.0)
Hemoglobin: 10.5 g/dL — ABNORMAL LOW (ref 13.0–17.0)
MCH: 34.7 pg — AB (ref 26.0–34.0)
MCHC: 34.7 g/dL (ref 30.0–36.0)
MCV: 100 fL (ref 78.0–100.0)
Platelets: 82 10*3/uL — ABNORMAL LOW (ref 150–400)
RBC: 3.03 MIL/uL — AB (ref 4.22–5.81)
RDW: 21 % — ABNORMAL HIGH (ref 11.5–15.5)
WBC: 1.4 10*3/uL — AB (ref 4.0–10.5)

## 2014-11-28 LAB — GLUCOSE, CAPILLARY: GLUCOSE-CAPILLARY: 126 mg/dL — AB (ref 65–99)

## 2014-11-28 LAB — APTT: aPTT: 25 seconds (ref 24–37)

## 2014-11-28 MED ORDER — HEPARIN SOD (PORK) LOCK FLUSH 100 UNIT/ML IV SOLN
INTRAVENOUS | Status: AC | PRN
  Administered 2014-11-28: 500 [IU]

## 2014-11-28 MED ORDER — MIDAZOLAM HCL 2 MG/2ML IJ SOLN
INTRAMUSCULAR | Status: DC
Start: 2014-11-28 — End: 2014-11-29
  Filled 2014-11-28: qty 4

## 2014-11-28 MED ORDER — HEPARIN SOD (PORK) LOCK FLUSH 100 UNIT/ML IV SOLN
INTRAVENOUS | Status: AC
  Filled 2014-11-28: qty 5

## 2014-11-28 MED ORDER — CEFAZOLIN SODIUM-DEXTROSE 2-3 GM-% IV SOLR
INTRAVENOUS | Status: AC
  Filled 2014-11-28: qty 50

## 2014-11-28 MED ORDER — MIDAZOLAM HCL 2 MG/2ML IJ SOLN
INTRAMUSCULAR | Status: AC | PRN
  Administered 2014-11-28 (×3): 0.5 mg via INTRAVENOUS

## 2014-11-28 MED ORDER — FENTANYL CITRATE (PF) 100 MCG/2ML IJ SOLN
INTRAMUSCULAR | Status: DC
Start: 2014-11-28 — End: 2014-11-29
  Filled 2014-11-28: qty 2

## 2014-11-28 MED ORDER — CEFAZOLIN SODIUM-DEXTROSE 2-3 GM-% IV SOLR
2.0000 g | INTRAVENOUS | Status: AC
  Administered 2014-11-28: 2 g via INTRAVENOUS

## 2014-11-28 MED ORDER — SODIUM CHLORIDE 0.9 % IV SOLN
Freq: Once | INTRAVENOUS | Status: AC
  Administered 2014-11-28: 500 mL via INTRAVENOUS

## 2014-11-28 MED ORDER — FENTANYL CITRATE (PF) 100 MCG/2ML IJ SOLN
INTRAMUSCULAR | Status: AC | PRN
  Administered 2014-11-28 (×2): 25 ug via INTRAVENOUS

## 2014-11-28 MED ORDER — LIDOCAINE HCL 1 % IJ SOLN
INTRAMUSCULAR | Status: DC
Start: 2014-11-28 — End: 2014-11-29
  Filled 2014-11-28: qty 20

## 2014-11-28 NOTE — Progress Notes (Signed)
CRITICAL VALUE ALERT  Critical value received:  WBC  Date of notification:  11 02 2016  Time of notification:  1242  Critical value read back:wbc 1.4  Nurse who received alert:  Antionette Fairy  MD notified (1st page):  Donella Stade PA  Time of first page:  1243  MD notified (2nd page):  Time of second page:  Responding MD:  Ascencion Dike PA on floor and viewed in computer  Time MD responded:  1243

## 2014-11-28 NOTE — H&P (Signed)
Chief Complaint: Patient was seen in consultation today for port placement at the request of Shadad,Firas N  Referring Physician(s): Shadad,Firas N  History of Present Illness: [name] is a 81 y.o. male with prior hx of lymphoma for which he was treated. He now has myelodysplastic syndrome with pancytopenia requiring intravenous therapies and frequent transfusions an he has poor venous access. He is referred for port placement. Had a port several years ago for lymphoma, subsequently removed. He feels fairly well today, had a 2 unit PRBC transfusion last week due to anemia No recent fevers, chills, illness. Has been NPO since before 0700 today.  Past Medical History  Diagnosis Date  . Allergic rhinitis   . Hyperlipidemia   . Exogenous obesity   . Edema     venous insufficiency  . Second degree Mobitz II AV block 12/11/08    with transient syncope, resolved s/p PPM  . Paroxysmal atrial fibrillation ([place]) 03/16/11    diagnosed by PPM interrogation  . Pancytopenia ([place])   . Postural dizziness   . CHF (congestive heart failure) ([place])   . Pacemaker   . Dyspnea     "all the time w/the temporary pacer" (12/29/2012)  . Type II diabetes mellitus ([place]) 2012  . History of blood transfusion     "3 recently" (12/29/2012)  . Daily headache     "recently w/the ATB" (12/29/2012)  . Arthritis     "hips; knees" (12/29/2012)  . 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   . Poor venous access     Past Surgical History  Procedure Laterality Date  . Lymph node biopsy  04/2005    groin  . Sternotomy  2000  . Pericardiectomy  2000  . Penile prosthesis implant      and removal  . Pacemaker insertion  12/11/08    MDT implanted by Dr Rayann Heman  . Tee without cardioversion N/A 10/31/2012    Procedure: TRANSESOPHAGEAL ECHOCARDIOGRAM (TEE);  Surgeon: Lelon Perla, MD;  Location: [facility] ENDOSCOPY;  Service: Cardiovascular;  Laterality:  N/A;  . Pacemaker lead removal Left 11/04/2012    Procedure: PACEMAKER LEAD REMOVAL;  Surgeon: Evans Lance, MD;  Location: [facility];  Service: Cardiovascular;  Laterality: Left;  . Temporary pacemaker insertion Left 11/04/2012    Procedure: TEMPORARY PACEMAKER INSERTION;  Surgeon: Evans Lance, MD;  Location: [facility];  Service: Cardiovascular;  Laterality: Left;  . Insert / replace / remove pacemaker  12/29/2012  . Bi-ventricular pacemaker upgrade N/A 12/29/2012    Procedure: BI-VENTRICULAR PACEMAKER UPGRADE;  Surgeon: Evans Lance, MD;  Location: [facility] CATH LAB;  Service: Cardiovascular;  Laterality: N/A;    Allergies: Review of patient's allergies indicates no known allergies.  Medications: Prior to Admission medications   Medication Sig Start Date End Date Taking? Authorizing Provider  albuterol (PROVENTIL HFA;VENTOLIN HFA) 108 (90 BASE) MCG/ACT inhaler Inhale 1-2 puffs into the lungs every 4 (four) hours as needed for wheezing or shortness of breath. Patient taking differently: Take 2 puffs by mouth every 4-6 hours as needed for wheezing and shortness of breath 02/20/14  Yes Tammy S Parrett, NP  aspirin 81 MG tablet Take 81 mg by mouth every morning. (hold if bleeding)   Yes Historical Provider, MD  calcium-vitamin D (OS-CAL 500 + D) 500-200 MG-UNIT per tablet Take 1 tablet by mouth 2 (two) times daily.    Yes Historical Provider, MD  cetirizine (ZYRTEC)  10 MG tablet Take 10 mg by mouth at bedtime as needed (for drippy nose).    Yes Historical Provider, MD  Cholecalciferol (VITAMIN D) 1000 UNITS capsule Take 1,000 Units by mouth 2 (two) times daily.     Yes Historical Provider, MD  DULERA 200-5 MCG/ACT AERO INHALE 2 PUFFS FIRST THING EVERY MORNING AND THEN ANOTHER 2 PUFFS ABOUT 12 HOURS LATER 09/28/14  Yes Tanda Rockers, MD  famotidine (PEPCID) 20 MG tablet Take 20 mg by mouth at bedtime.    Yes Historical Provider, MD  finasteride (PROSCAR) 5 MG tablet Take 5 mg by mouth every morning.    Yes  Historical Provider, MD  furosemide (LASIX) 40 MG tablet TAKE 1 TABLET BY MOUTH DAILY. MAY TAKE 1 EXTRA TABLET DAILY IF NEEDED FOR SWELLING IN LEGS 10/26/14  Yes Tanda Rockers, MD  KLOR-CON M20 20 MEQ tablet TAKE 1 TABLET BY MOUTH EVERY DAY Patient taking differently: TAKE 1 TABLET BY MOUTH EVERY MORNING 06/21/14  Yes Tanda Rockers, MD  levothyroxine (SYNTHROID, LEVOTHROID) 25 MCG tablet TAKE 1 TABLET BY MOUTH EVERY MORNING BEFORE BREAKFAST 09/13/14  Yes Tanda Rockers, MD  metFORMIN (GLUCOPHAGE) 500 MG tablet TAKE 1 TABLET BY MOUTH TWICE A DAY WITH FOOD Patient taking differently: TAKE 1/2 TABLET BY MOUTH TWICE A DAY AFTER MEALS 02/23/14  Yes Tanda Rockers, MD  mometasone (NASONEX) 50 MCG/ACT nasal spray Use 0-2 puffs every morning and 1-2 puffs every evening   Yes Tammy S Parrett, NP  omeprazole (PRILOSEC) 20 MG capsule Take 40 mg by mouth daily before breakfast.    Yes Historical Provider, MD  oxybutynin (DITROPAN-XL) 10 MG 24 hr tablet Take 10 mg by mouth every morning.    Yes Historical Provider, MD  sulindac (CLINORIL) 200 MG tablet TAKE 1 TABLET BY MOUTH TWICE A DAY 08/22/14  Yes Tanda Rockers, MD  traZODone (DESYREL) 150 MG tablet TAKE 1/3 TABLET BY MOUTH AT BEDTIME 10/26/14  Yes Tanda Rockers, MD  amoxicillin-clavulanate (AUGMENTIN) 875-125 MG per tablet Take 1 tablet by mouth 2 (two) times daily. Patient not taking: Reported on 10/18/2014 07/18/14   Tanda Rockers, MD  dextromethorphan-guaiFENesin [facility] DM) 30-600 MG per 12 hr tablet Take 1 tablet by mouth every 12 (twelve) hours as needed for cough.  06/26/10   Tammy S Parrett, NP  docusate sodium (COLACE) 100 MG capsule Take 100 mg by mouth daily as needed for moderate constipation.     Historical Provider, MD  hydrocortisone cream 1 % Apply 1 application topically 2 (two) times daily as needed (rash or itchy skin).     Historical Provider, MD  hydrOXYzine (ATARAX/VISTARIL) 25 MG tablet Take 1-2 tablets by mouth every 4-6 hours as needed  for itching    Historical Provider, MD  methylcellulose (CITRUCEL) oral powder Take 1 packet by mouth daily.     Historical Provider, MD  mometasone-formoterol (DULERA) 200-5 MCG/ACT AERO Inhale 2 puffs into the lungs 2 (two) times daily.    Historical Provider, MD  Multiple Vitamin (MULTIVITAMIN) tablet Take 1 tablet by mouth daily.      Historical Provider, MD  NASONEX 50 MCG/ACT nasal spray USE 2 SPRAYS NASALLY TWICE DAILY 10/18/14   Tanda Rockers, MD  nitroGLYCERIN (NITROSTAT) 0.4 MG SL tablet Place 1 tablet (0.4 mg total) under the tongue every 5 (five) minutes as needed for chest pain. 06/04/14   Thompson Grayer, MD  Nutritional Supplements (ADVANCE MULTI-VITAMIN/MINERALS PO) Take 1 tablet by mouth every  morning.    Historical Provider, MD  polyethylene glycol powder (MIRALAX) powder Take 17 g by mouth daily as needed for mild constipation or moderate constipation.     Historical Provider, MD  predniSONE (DELTASONE) 10 MG tablet Take  4 each am x 2 days,   2 each am x 2 days,  1 each am x 2 days and stop Patient not taking: Reported on 10/18/2014 07/18/14   Tanda Rockers, MD  sulindac (CLINORIL) 200 MG tablet Take 1 tablet (200 mg total) by mouth 2 (two) times daily as needed (pain). Patient taking differently: Take 1 tablet by mouth twice a day with food as needed for arthritis pain, hip, and back pain 08/21/14   Tanda Rockers, MD  traMADol Veatrice Bourbon) 50 MG tablet Take 1/2 to one  tablet by mouth every 4 hours as needed for severe pain or cough 07/18/14   Tanda Rockers, MD     Family History  Problem Relation Age of Onset  . Diabetes Mother   . Liver cancer Brother   . Cancer Sister     Social History   Social History  . Marital Status: Widowed    Spouse Name: N/A  . Number of Children: 2  . Years of Education: N/A   Occupational History  . retired    Social History Main Topics  . Smoking status: Former Smoker -- 1.00 packs/day for 30 years    Types: Cigarettes    Quit date:  01/27/1968  . Smokeless tobacco: Former Systems developer    Types: Chew  . Alcohol Use: No  . Drug Use: No  . Sexual Activity: No   Other Topics Concern  . None   Social History Narrative   Lives in [place]   No etoh          Review of Systems: A 12 point ROS discussed and pertinent positives are indicated in the HPI above.  All other systems are negative.  Review of Systems  Vital Signs: BP 135/74 mmHg  Pulse 86  Temp(Src) 98.8 F (37.1 C) (Oral)  Resp 18  SpO2 96%  Physical Exam  Constitutional: He is oriented to person, place, and time. He appears well-developed and well-nourished. No distress.  HENT:  Head: Normocephalic.  Mouth/Throat: Oropharynx is clear and moist.  Neck: Normal range of motion. No JVD present. No tracheal deviation present.  Cardiovascular: Normal rate, regular rhythm and normal heart sounds.   Palpable defibrillator on right chest wall.  Pulmonary/Chest: Effort normal and breath sounds normal. No respiratory distress. He has no wheezes.  Neurological: He is alert and oriented to person, place, and time.  Skin: Skin is warm and dry. He is not diaphoretic.  Psychiatric: He has a normal mood and affect. Judgment normal.    Mallampati Score:  MD Evaluation Airway: WNL Heart: WNL Abdomen: WNL Chest/ Lungs: WNL ASA  Classification: 3 Mallampati/Airway Score: Two  Imaging: No results found.  Labs:  CBC:  Recent Labs  10/24/14 0903 11/07/14 0905 11/21/14 0857 11/28/14 1150  WBC 1.2* 1.1* 1.2* 1.4*  HGB 8.5* 8.3* 8.5* 10.5*  HCT 24.6* 24.2* 25.0* 30.3*  PLT 78* 67* 67* 82*    COAGS:  Recent Labs  11/28/14 1150  INR 1.05  APTT 25    BMP:  Recent Labs  12/18/13 1013 07/18/14 1408 11/28/14 1150  NA 139 136 135  K 3.4* 3.8 4.0  CL 102 99 98*  CO2 26 29 28   GLUCOSE 194* 142* 130*  BUN 16 14 15   CALCIUM 9.9 9.7 9.8  CREATININE 0.9 0.85 0.73  GFRNONAA  --   --  >60  GFRAA  --   --  >60    Assessment and  Plan: Pancytopenia from myelodysplastic syndrome Neutropenia Plan for port placement today. Labs reviewed Risks and Benefits discussed with the patient including, but not limited to bleeding, infection, pneumothorax, or fibrin sheath development and need for additional procedures.  Increased risk of infection due to both leukopenia and DM. Will send pt home with 1 week po Keflex 500mg  tid. All of the patient's questions were answered, patient is agreeable to proceed. Consent signed and in chart.    SignedAscencion Dike 11/28/2014, 12:57 PM   I spent a total of  15 Minutes in face to face in clinical consultation, greater than 50% of which was counseling/coordinating care for port placement

## 2014-11-28 NOTE — Procedures (Signed)
Interventional Radiology Procedure Note  Procedure: Placement of a right IJ approach single lumen PowerPort.  Tip is positioned at the superior cavoatrial junction and catheter is ready for immediate use.  Complications: No immediate Recommendations:  - Ok to shower tomorrow - Do not submerge for 7 days - Routine line care   Benny Deutschman T. Aitan Rossbach, M.D Pager:  [phone]   

## 2014-11-28 NOTE — Discharge Instructions (Signed)
Implanted Port Insertion, Care After °Refer to this sheet in the next few weeks. These instructions provide you with information on caring for yourself after your procedure. Your health care provider may also give you more specific instructions. Your treatment has been planned according to current medical practices, but problems sometimes occur. Call your health care provider if you have any problems or questions after your procedure. °WHAT TO EXPECT AFTER THE PROCEDURE °After your procedure, it is typical to have the following:  °· Discomfort at the port insertion site. Ice packs to the area will help. °· Bruising on the skin over the port. This will subside in 3-4 days. °HOME CARE INSTRUCTIONS °· After your port is placed, you will get a manufacturer's information card. The card has information about your port. Keep this card with you at all times.   °· Know what kind of port you have. There are many types of ports available.   °· Wear a medical alert bracelet in case of an emergency. This can help alert health care workers that you have a port.   °· The port can stay in for as long as your health care provider believes it is necessary.   °· A home health care nurse may give medicines and take care of the port.   °· You or a family member can get special training and directions for giving medicine and taking care of the port at home.   °SEEK MEDICAL CARE IF:  °· Your port does not flush or you are unable to get a blood return.   °· You have a fever or chills. °SEEK IMMEDIATE MEDICAL CARE IF: °· You have new fluid or pus coming from your incision.   °· You notice a bad smell coming from your incision site.   °· You have swelling, pain, or more redness at the incision or port site.   °· You have chest pain or shortness of breath. °  °This information is not intended to replace advice given to you by your health care provider. Make sure you discuss any questions you have with your health care provider. °  °Document  Released: 11/02/2012 Document Revised: 01/17/2013 Document Reviewed: 11/02/2012 °Elsevier Interactive Patient Education ©2016 [employer] °Implanted Port Home Guide °An implanted port is a type of central line that is placed under the skin. Central lines are used to provide IV access when treatment or nutrition needs to be given through a person's veins. Implanted ports are used for long-term IV access. An implanted port may be placed because:  °· You need IV medicine that would be irritating to the small veins in your hands or arms.   °· You need long-term IV medicines, such as antibiotics.   °· You need IV nutrition for a long period.   °· You need frequent blood draws for lab tests.   °· You need dialysis.   °Implanted ports are usually placed in the chest area, but they can also be placed in the upper arm, the abdomen, or the leg. An implanted port has two main parts:  °· Reservoir. The reservoir is round and will appear as a small, raised area under your skin. The reservoir is the part where a needle is inserted to give medicines or draw blood.   °· Catheter. The catheter is a thin, flexible tube that extends from the reservoir. The catheter is placed into a large vein. Medicine that is inserted into the reservoir goes into the catheter and then into the vein.   °HOW WILL I CARE FOR MY INCISION SITE? °Do not get the   incision site wet. Bathe or shower as directed by your health care provider.  °HOW IS MY PORT ACCESSED? °Special steps must be taken to access the port:  °· Before the port is accessed, a numbing cream can be placed on the skin. This helps numb the skin over the port site.   °· Your health care provider uses a sterile technique to access the port. °· Your health care provider must put on a mask and sterile gloves. °· The skin over your port is cleaned carefully with an antiseptic and allowed to dry. °· The port is gently pinched between sterile gloves, and a needle is inserted into the  port. °· Only "non-coring" port needles should be used to access the port. Once the port is accessed, a blood return should be checked. This helps ensure that the port is in the vein and is not clogged.   °· If your port needs to remain accessed for a constant infusion, a clear (transparent) bandage will be placed over the needle site. The bandage and needle will need to be changed every week, or as directed by your health care provider.   °· Keep the bandage covering the needle clean and dry. Do not get it wet. Follow your health care provider's instructions on how to take a shower or bath while the port is accessed.   °· If your port does not need to stay accessed, no bandage is needed over the port.   °WHAT IS FLUSHING? °Flushing helps keep the port from getting clogged. Follow your health care provider's instructions on how and when to flush the port. Ports are usually flushed with saline solution or a medicine called heparin. The need for flushing will depend on how the port is used.  °· If the port is used for intermittent medicines or blood draws, the port will need to be flushed:   °· After medicines have been given.   °· After blood has been drawn.   °· As part of routine maintenance.   °· If a constant infusion is running, the port may not need to be flushed.   °HOW LONG WILL MY PORT STAY IMPLANTED? °The port can stay in for as long as your health care provider thinks it is needed. When it is time for the port to come out, surgery will be done to remove it. The procedure is similar to the one performed when the port was put in.  °WHEN SHOULD I SEEK IMMEDIATE MEDICAL CARE? °When you have an implanted port, you should seek immediate medical care if:  °· You notice a bad smell coming from the incision site.   °· You have swelling, redness, or drainage at the incision site.   °· You have more swelling or pain at the port site or the surrounding area.   °· You have a fever that is not controlled with  medicine. °  °This information is not intended to replace advice given to you by your health care provider. Make sure you discuss any questions you have with your health care provider. °  °Document Released: 01/12/2005 Document Revised: 11/02/2012 Document Reviewed: 09/19/2012 °Elsevier Interactive Patient Education ©2016 Else[employer] Inc[employer] °Moderate Conscious Sedation, Adult °Sedation is the use of medicines to promote relaxation and relieve discomfort and anxiety. Moderate conscious sedation is a type of sedation. Under moderate conscious sedation you are less alert than normal but are still able to respond to instructions or stimulation. Moderate conscious sedation is used during short medical and dental procedures. It is milder than deep sedation or general anesthesia and   allows you to return to your regular activities sooner. °LET YOUR HEALTH CARE PROVIDER KNOW ABOUT:  °· Any allergies you have. °· All medicines you are taking, including vitamins, herbs, eye drops, creams, and over-the-counter medicines. °· Use of steroids (by mouth or creams). °· Previous problems you or members of your family have had with the use of anesthetics. °· Any blood disorders you have. °· Previous surgeries you have had. °· Medical conditions you have. °· Possibility of pregnancy, if this applies. °· Use of cigarettes, alcohol, or illegal drugs. °RISKS AND COMPLICATIONS °Generally, this is a safe procedure. However, as with any procedure, problems can occur. Possible problems include: °· Oversedation. °· Trouble breathing on your own. You may need to have a breathing tube until you are awake and breathing on your own. °· Allergic reaction to any of the medicines used for the procedure. °BEFORE THE PROCEDURE °· You may have blood tests done. These tests can help show how well your kidneys and liver are working. They can also show how well your blood clots. °· A physical exam will be done.   °· Only take medicines as directed by your  health care provider. You may need to stop taking medicines (such as blood thinners, aspirin, or nonsteroidal anti-inflammatory drugs) before the procedure.   °· Do not eat or drink at least 6 hours before the procedure or as directed by your health care provider. °· Arrange for a responsible adult, family member, or friend to take you home after the procedure. He or she should stay with you for at least 24 hours after the procedure, until the medicine has worn off. °PROCEDURE  °· An intravenous (IV) catheter will be inserted into one of your veins. Medicine will be able to flow directly into your body through this catheter. You may be given medicine through this tube to help prevent pain and help you relax. °· The medical or dental procedure will be done. °AFTER THE PROCEDURE °· You will stay in a recovery area until the medicine has worn off. Your blood pressure and pulse will be checked.   °·  Depending on the procedure you had, you may be allowed to go home when you can tolerate liquids and your pain is under control. °  °This information is not intended to replace advice given to you by your health care provider. Make sure you discuss any questions you have with your health care provider. °  °Document Released: 10/07/2000 Document Revised: 02/02/2014 Document Reviewed: 09/19/2012 °Elsevier Interactive Patient Education ©2016 Else[employer] Inc[employer] °Moderate Conscious Sedation, Adult, Care After °Refer to this sheet in the next few weeks. These instructions provide you with information on caring for yourself after your procedure. Your health care provider may also give you more specific instructions. Your treatment has been planned according to current medical practices, but problems sometimes occur. Call your health care provider if you have any problems or questions after your procedure. °WHAT TO EXPECT AFTER THE PROCEDURE  °After your procedure: °· You may feel sleepy, clumsy, and have poor balance for several  hours. °· Vomiting may occur if you eat too soon after the procedure. °HOME CARE INSTRUCTIONS °· Do not participate in any activities where you could become injured for at least 24 hours. Do not: °¨ Drive. °¨ Swim. °¨ Ride a bicycle. °¨ Operate heavy machinery. °¨ Cook. °¨ Use power tools. °¨ Climb ladders. °¨ Work from a high place. °· Do not make important decisions or sign legal documents until you are improved. °·   If you vomit, drink water, juice, or soup when you can drink without vomiting. Make sure you have little or no nausea before eating solid foods. °· Only take over-the-counter or prescription medicines for pain, discomfort, or fever as directed by your health care provider. °· Make sure you and your family fully understand everything about the medicines given to you, including what side effects may occur. °· You should not drink alcohol, take sleeping pills, or take medicines that cause drowsiness for at least 24 hours. °· If you smoke, do not smoke without supervision. °· If you are feeling better, you may resume normal activities 24 hours after you were sedated. °· Keep all appointments with your health care provider. °SEEK MEDICAL CARE IF: °· Your skin is pale or bluish in color. °· You continue to feel nauseous or vomit. °· Your pain is getting worse and is not helped by medicine. °· You have bleeding or swelling. °· You are still sleepy or feeling clumsy after 24 hours. °SEEK IMMEDIATE MEDICAL CARE IF: °· You develop a rash. °· You have difficulty breathing. °· You develop any type of allergic problem. °· You have a fever. °MAKE SURE YOU: °· Understand these instructions. °· Will watch your condition. °· Will get help right away if you are not doing well or get worse. °  °This information is not intended to replace advice given to you by your health care provider. Make sure you discuss any questions you have with your health care provider. °  °Document Released: 11/02/2012 Document Revised:  02/02/2014 Document Reviewed: 11/02/2012 °Elsevier Interactive Patient Education ©2016 Elsevier Inc. ° °

## 2014-11-30 ENCOUNTER — Ambulatory Visit: Payer: Medicare Other | Admitting: Internal Medicine

## 2014-12-03 ENCOUNTER — Ambulatory Visit (INDEPENDENT_AMBULATORY_CARE_PROVIDER_SITE_OTHER): Payer: Medicare Other | Admitting: *Deleted

## 2014-12-03 ENCOUNTER — Ambulatory Visit (INDEPENDENT_AMBULATORY_CARE_PROVIDER_SITE_OTHER): Payer: Medicare Other | Admitting: Internal Medicine

## 2014-12-03 ENCOUNTER — Encounter: Payer: Self-pay | Admitting: Internal Medicine

## 2014-12-03 VITALS — BP 124/68 | HR 100 | Ht 71.0 in | Wt 241.8 lb

## 2014-12-03 DIAGNOSIS — J4530 Mild persistent asthma, uncomplicated: Secondary | ICD-10-CM | POA: Diagnosis not present

## 2014-12-03 DIAGNOSIS — J9611 Chronic respiratory failure with hypoxia: Secondary | ICD-10-CM | POA: Diagnosis not present

## 2014-12-03 DIAGNOSIS — Z23 Encounter for immunization: Secondary | ICD-10-CM

## 2014-12-03 DIAGNOSIS — I442 Atrioventricular block, complete: Secondary | ICD-10-CM

## 2014-12-03 DIAGNOSIS — E1365 Other specified diabetes mellitus with hyperglycemia: Secondary | ICD-10-CM | POA: Diagnosis not present

## 2014-12-03 DIAGNOSIS — IMO0002 Reserved for concepts with insufficient information to code with codable children: Secondary | ICD-10-CM

## 2014-12-03 NOTE — Patient Instructions (Signed)
No nap at all during the day if not sleeping well   Increase the trazadone to 150 mg take 2/3 at bedtime and if still not sleeping ok to take the whole pill   Do not turn only any bright lights if you do wake up and read something dull by a dim light   Please schedule a follow up visit in 3 months but call sooner if needed to See Tammy NP

## 2014-12-03 NOTE — Progress Notes (Signed)
Remote pacemaker transmission.   

## 2014-12-03 NOTE — Progress Notes (Signed)
Subjective:     Patient ID: [name], male   DOB: 1932-12-11    MRN: [id number]  Brief patient profile:  5 yowm quit smoking 1970 with morbid obesity complicated by aodm, Lymphoma s/p chemo ([facility]) and tendency to peripheral edema that is felt to be dependent and related to obesity/ venous insuff.   History of Present Illness   Admit date: 10/27/2012  Discharge date: 11/08/2012  Primary Care Physician: Christinia Gully, MD  Final Discharge Diagnoses:  Staph aureus bacteremia/sepsis of unclear etiology, likely with assumption that the pacemaker is contaminated  Secondary diagnosis . acute encephalopathy . Abdominal pain . Bacteremia due to Staphylococcus aureus . generalized debility  . Other pancytopenia . Hyponatremia . LYMPHOMA NEC, MLIG, INGUINAL/LOWER LIMB . Essential hypertension, benign . GERD . DM (diabetes mellitus), secondary uncontrolled       07/18/2014 f/u ov/Wert re: acute cough/ abrupt L cw pain/ positional/ f/u dm / obesity   Chief Complaint  Patient presents with  . Acute Visit    Pt c/o increased cough and chest congestion x 2 wks. Cough is non prod most of the time, but did produce min green sputum a few days ago. He is using rescue inhaler 2-3 x per day. He also c/o left side pain that started 4 days ago after bending over to pick something up. He feels like he may have pulled a muscle.    using lots of saba day > noct -  mdi not optimal (see a/p) Has not used any tramadol for cough though it is on the calendar  >>augmentin and pred taper      12/03/2014  f/u ov/Wert re:  Obesity/ dm/ ? AB  Chief Complaint  Patient presents with  . Follow-up    Pt c/o of continued insomnia (wakes at night and can't get back to sleep), instability with legs that makes him stumble and increased SOB and need for O2 during the day (currently only uses at night).   Weakness and breathing better p saba   No obvious day to day or daytime variability or assoc chronic  cough or cp or chest tightness, subjective wheeze or overt sinus or hb symptoms. No unusual exp hx or h/o childhood pna/ asthma or knowledge of premature birth.  Sleeping ok without nocturnal  or early am exacerbation  of respiratory  c/o's or need for noct saba. Also denies any obvious fluctuation of symptoms with weather or environmental changes or other aggravating or alleviating factors except as outlined above   Current Medications, Allergies, Complete Past Medical History, Past Surgical History, Family History, and Social History were reviewed in [employer] record.  ROS  The following are not active complaints unless bolded sore throat, dysphagia, dental problems, itching, sneezing,  nasal congestion or excess/ purulent secretions, ear ache,   fever, chills, sweats, unintended wt loss, classically pleuritic or exertional cp, hemoptysis,  orthopnea pnd or leg swelling, presyncope, palpitations, abdominal pain, anorexia, nausea, vomiting, diarrhea  or change in bowel or bladder habits, change in stools or urine, dysuria,hematuria,  rash, arthralgias, visual complaints, headache, numbness, weakness or ataxia or problems with walking or coordination,  change in mood/affect or memory.                    Past Medical History:   LYMPHOMA NEC, MLIG, INGUINAL/LOWER LIMB (ICD-202.85) dx 04/2005.......[facility]  - last chemo 10/2006 -repeat CT/ABD CT 11/18/07--no reccurence  - Pancytopenia August 06, 2009 > refer back to [facility] >  aranesp rx Jan 2012  RHINITIS, ALLERGIC NOS (ICD-477.9)  HYPERLIPIDEMIA NEC/NOS (ICD-272.4)  EXOGENOUS OBESITY (ICD-278.00)  - ideal body weight less than 186  AODM onset 2012 with freq pred for airways issues -HgA1C 5.5 12/12 >metformin decreased 500mg  1/2 Twice daily   Vitamin D Deficiency- level 29>>44 (4/9//10)  Left Hip pain onset around 6/09............................................................................   [facility]  - MRI  11/23/2007 c/w L1-2 bulging disc indents the thecal sac with foraminal stenosis  HEALTH MAINTENANCE..........................................................................................[facility]  - Td 10/2005  - Pneumovax 10/07 second shot  - CPX 08/20/2010  --colon 02/2005 -int/extern. hems (repeat q7y)  Complex med regimen  --Meds reviewed with pt education and computerized med calendar completed/adjusted. November 08, 2008 , August 21, 2009 updated 02/19/2011 , 10/23/2011 , 09/19/2013  Syncope...........................................................................................................[facility]  - Mobitz II AV block s/p Medtronic pacemaker placement 12/11/2008  [facility]at[facility].....................................................................................................[facility]  - Pruritic rash 04/2009 > tried lotrisone, referred to [facility] August 06, 2009  Memory impairment , MMSE 27/30 8/25>refer to neuro   Family History:   mother had diabetes   Social History:  Patient states former smoker.  quit smoking in 1970  No ETOH  Retired            Objective:   Physical Exam  wt 244 January 16, 2008 > 249 April 24, 2009  >07/03/2011 246 >  12/18/2013 231 >227 02/20/2014 > 07/18/2014   242 >249 10/18/14 > 12/03/2014   242    amb obese wm in no acute distress / vital signs reviewed  HEENT: nl dentition and orophanx.  Nasal mucosa dry, Mucus membranes pink and moist. No tongue or throat exudate.  Neck: supple with full ROM. no JVD, node enlargement or TMJ   Lungs: clear and equal bilateral breath sounds.diminished in bases  Chest: RRR. No murmurs, rubs, or gallops.   Tr- peripheral edema.  Abd: obese but soft and no rebound or focal tenderness. Normal excursion in the supine position.  Ext warm and dry, no calf tenderness, cyanosis or clubbing. mild/mod chronic venous insufficiency changes. Varicose veins present. Neuro: nl sensorium .[name] Gets up from chair fine without  hands, sym strength both legs, nml grips, CN 2-12 intact , no facial droop   Skin:   No rash                 Assessment:

## 2014-12-04 ENCOUNTER — Encounter: Payer: Self-pay | Admitting: Cardiology

## 2014-12-04 LAB — CUP PACEART REMOTE DEVICE CHECK
Battery Remaining Longevity: 126 mo
Brady Statistic AP VP Percent: 1 %
Brady Statistic AS VP Percent: 99 %
Brady Statistic AS VS Percent: 0 %
Implantable Lead Implant Date: 20141204
Implantable Lead Location: 753860
Lead Channel Impedance Value: 441 Ohm
Lead Channel Pacing Threshold Amplitude: 0.75 V
Lead Channel Pacing Threshold Pulse Width: 0.4 ms
Lead Channel Sensing Intrinsic Amplitude: 2.8 mV
Lead Channel Setting Pacing Pulse Width: 0.4 ms
Lead Channel Setting Sensing Sensitivity: 4 mV
MDC IDC LEAD IMPLANT DT: 20141204
MDC IDC LEAD LOCATION: 753859
MDC IDC MSMT BATTERY IMPEDANCE: 110 Ohm
MDC IDC MSMT BATTERY VOLTAGE: 2.78 V
MDC IDC MSMT LEADCHNL RA IMPEDANCE VALUE: 487 Ohm
MDC IDC MSMT LEADCHNL RA PACING THRESHOLD AMPLITUDE: 0.875 V
MDC IDC MSMT LEADCHNL RA PACING THRESHOLD PULSEWIDTH: 0.4 ms
MDC IDC SESS DTM: 20161107134630
MDC IDC SET LEADCHNL RA PACING AMPLITUDE: 2 V
MDC IDC SET LEADCHNL RV PACING AMPLITUDE: 2.5 V
MDC IDC STAT BRADY AP VS PERCENT: 0 %

## 2014-12-05 ENCOUNTER — Other Ambulatory Visit (HOSPITAL_BASED_OUTPATIENT_CLINIC_OR_DEPARTMENT_OTHER): Payer: Medicare Other

## 2014-12-05 ENCOUNTER — Ambulatory Visit: Payer: Medicare Other

## 2014-12-05 ENCOUNTER — Other Ambulatory Visit: Payer: Self-pay | Admitting: *Deleted

## 2014-12-05 ENCOUNTER — Ambulatory Visit (HOSPITAL_BASED_OUTPATIENT_CLINIC_OR_DEPARTMENT_OTHER): Payer: Medicare Other

## 2014-12-05 VITALS — BP 140/70 | HR 98 | Temp 98.5°F

## 2014-12-05 DIAGNOSIS — N189 Chronic kidney disease, unspecified: Secondary | ICD-10-CM

## 2014-12-05 DIAGNOSIS — D631 Anemia in chronic kidney disease: Secondary | ICD-10-CM

## 2014-12-05 DIAGNOSIS — D61818 Other pancytopenia: Secondary | ICD-10-CM | POA: Diagnosis not present

## 2014-12-05 LAB — CBC WITH DIFFERENTIAL/PLATELET
BASO%: 0.2 % (ref 0.0–2.0)
BASOS ABS: 0 10*3/uL (ref 0.0–0.1)
EOS%: 0.7 % (ref 0.0–7.0)
Eosinophils Absolute: 0 10*3/uL (ref 0.0–0.5)
HEMATOCRIT: 26.7 % — AB (ref 38.4–49.9)
HGB: 9.1 g/dL — ABNORMAL LOW (ref 13.0–17.1)
LYMPH#: 0.6 10*3/uL — AB (ref 0.9–3.3)
LYMPH%: 46.8 % (ref 14.0–49.0)
MCH: 35.1 pg — AB (ref 27.2–33.4)
MCHC: 34.3 g/dL (ref 32.0–36.0)
MCV: 102.5 fL — ABNORMAL HIGH (ref 79.3–98.0)
MONO#: 0 10*3/uL — ABNORMAL LOW (ref 0.1–0.9)
MONO%: 0.6 % (ref 0.0–14.0)
NEUT#: 0.7 10*3/uL — ABNORMAL LOW (ref 1.5–6.5)
NEUT%: 51.7 % (ref 39.0–75.0)
PLATELETS: 82 10*3/uL — AB (ref 140–400)
RBC: 2.6 10*6/uL — ABNORMAL LOW (ref 4.20–5.82)
RDW: 24.9 % — ABNORMAL HIGH (ref 11.0–14.6)
WBC: 1.3 10*3/uL — ABNORMAL LOW (ref 4.0–10.3)

## 2014-12-05 LAB — HOLD TUBE, BLOOD BANK

## 2014-12-05 MED ORDER — LIDOCAINE-PRILOCAINE 2.5-2.5 % EX CREA: 1.0000 "application " | TOPICAL_CREAM | CUTANEOUS | Status: DC | PRN

## 2014-12-05 MED ORDER — DARBEPOETIN ALFA 300 MCG/0.6ML IJ SOSY
300.0000 ug | PREFILLED_SYRINGE | Freq: Once | INTRAMUSCULAR | Status: AC
  Administered 2014-12-05: 300 ug via SUBCUTANEOUS
  Filled 2014-12-05: qty 0.6

## 2014-12-05 NOTE — Progress Notes (Unsigned)
Patient asymptomatic, HGB 9.1. No transfusion today. Information relayed to [facility]. Patient requesting a prescription for Emla Cream. Pt has no complaints.

## 2014-12-09 ENCOUNTER — Encounter: Payer: Self-pay | Admitting: Internal Medicine

## 2014-12-09 NOTE — Assessment & Plan Note (Signed)
ONO + 10/30/14 >begin O2 at 2l/m 11/02/2014   Adequate control on present rx, reviewed > no change in rx needed  But needs to work on sleep hygiene s using benzo's > try titrating up trazadone to max of 150 mg at hs

## 2014-12-09 NOTE — Assessment & Plan Note (Signed)
Body mass index is 33.74 kg/(m^2).  Lab Results  Component Value Date   TSH 2.79 12/18/2013     Contributing to gerd tendency/ doe/reviewed the need and the process to achieve and maintain neg calorie balance

## 2014-12-09 NOTE — Assessment & Plan Note (Signed)
-  hfa   Instrictucted/ mastered 08/19/2012 @  75% effective   - trial of dulera 200 08/19/2012  > improved 09/05/12  Still occ noct wheeze reported but this is prob gerd/ upper airway wheezing as lungs quite clear on exam   All goals of chronic asthma control met including optimal function and elimination of symptoms with minimal need for rescue therapy.  Contingencies discussed in full including contacting this office immediately if not controlling the symptoms using the rule of two's.

## 2014-12-09 NOTE — Assessment & Plan Note (Addendum)
-   New onset 2012 while on freq doses of pred for airways dz >>Nutritionist referral 06/20/2010  And started on metfomin 07/02/10  Lab Results  Component Value Date   HGBA1C 6.4 07/18/2014   HGBA1C 6.4 12/18/2013   HGBA1C 6.1 06/14/2013     Adequate control on present rx, reviewed > no change in rx needed     I had an extended discussion with the patient and daughter  reviewing all relevant studies completed to date and  lasting 15 to 20 minutes of a 25 minute visit    Each maintenance medication was reviewed in detail including most importantly the difference between maintenance and prns and under what circumstances the prns are to be triggered using an action plan format that is not reflected in the computer generated alphabetically organized AVS but trather by a customized med calendar that reflects the AVS meds with confirmed 100% correlation.   Please see instructions for details which were reviewed in writing and the patient given a copy highlighting the part that I personally wrote and discussed at today's ov.

## 2014-12-18 ENCOUNTER — Encounter: Payer: Self-pay | Admitting: Cardiology

## 2014-12-19 ENCOUNTER — Ambulatory Visit (HOSPITAL_BASED_OUTPATIENT_CLINIC_OR_DEPARTMENT_OTHER): Payer: Medicare Other

## 2014-12-19 ENCOUNTER — Ambulatory Visit (HOSPITAL_BASED_OUTPATIENT_CLINIC_OR_DEPARTMENT_OTHER): Payer: Medicare Other | Admitting: Oncology

## 2014-12-19 ENCOUNTER — Other Ambulatory Visit (HOSPITAL_BASED_OUTPATIENT_CLINIC_OR_DEPARTMENT_OTHER): Payer: Medicare Other

## 2014-12-19 ENCOUNTER — Telehealth: Payer: Self-pay | Admitting: Oncology

## 2014-12-19 VITALS — BP 122/70 | HR 85 | Temp 98.8°F | Resp 16

## 2014-12-19 VITALS — BP 112/63 | HR 99 | Temp 97.8°F | Resp 18 | Ht 71.0 in | Wt 241.9 lb

## 2014-12-19 DIAGNOSIS — D696 Thrombocytopenia, unspecified: Secondary | ICD-10-CM | POA: Diagnosis not present

## 2014-12-19 DIAGNOSIS — D469 Myelodysplastic syndrome, unspecified: Secondary | ICD-10-CM | POA: Diagnosis present

## 2014-12-19 DIAGNOSIS — D61818 Other pancytopenia: Secondary | ICD-10-CM

## 2014-12-19 DIAGNOSIS — D472 Monoclonal gammopathy: Secondary | ICD-10-CM | POA: Diagnosis not present

## 2014-12-19 DIAGNOSIS — D709 Neutropenia, unspecified: Secondary | ICD-10-CM

## 2014-12-19 DIAGNOSIS — N189 Chronic kidney disease, unspecified: Secondary | ICD-10-CM

## 2014-12-19 DIAGNOSIS — Z8579 Personal history of other malignant neoplasms of lymphoid, hematopoietic and related tissues: Secondary | ICD-10-CM | POA: Diagnosis not present

## 2014-12-19 DIAGNOSIS — D631 Anemia in chronic kidney disease: Secondary | ICD-10-CM

## 2014-12-19 LAB — CBC WITH DIFFERENTIAL/PLATELET
BASO%: 0 % (ref 0.0–2.0)
Basophils Absolute: 0 10*3/uL (ref 0.0–0.1)
EOS%: 0.8 % (ref 0.0–7.0)
Eosinophils Absolute: 0 10*3/uL (ref 0.0–0.5)
HCT: 25.7 % — ABNORMAL LOW (ref 38.4–49.9)
HEMOGLOBIN: 8.9 g/dL — AB (ref 13.0–17.1)
LYMPH#: 0.7 10*3/uL — AB (ref 0.9–3.3)
LYMPH%: 53.7 % — ABNORMAL HIGH (ref 14.0–49.0)
MCH: 34.5 pg — ABNORMAL HIGH (ref 27.2–33.4)
MCHC: 34.6 g/dL (ref 32.0–36.0)
MCV: 99.6 fL — ABNORMAL HIGH (ref 79.3–98.0)
MONO#: 0 10*3/uL — ABNORMAL LOW (ref 0.1–0.9)
MONO%: 0.8 % (ref 0.0–14.0)
NEUT%: 44.7 % (ref 39.0–75.0)
NEUTROS ABS: 0.6 10*3/uL — AB (ref 1.5–6.5)
NRBC: 0 % (ref 0–0)
Platelets: 86 10*3/uL — ABNORMAL LOW (ref 140–400)
RBC: 2.58 10*6/uL — ABNORMAL LOW (ref 4.20–5.82)
RDW: 21.7 % — AB (ref 11.0–14.6)
WBC: 1.2 10*3/uL — ABNORMAL LOW (ref 4.0–10.3)

## 2014-12-19 LAB — HOLD TUBE, BLOOD BANK

## 2014-12-19 LAB — PREPARE RBC (CROSSMATCH)

## 2014-12-19 MED ORDER — SODIUM CHLORIDE 0.9 % IJ SOLN
10.0000 mL | INTRAMUSCULAR | Status: AC | PRN
  Administered 2014-12-19: 10 mL
  Filled 2014-12-19: qty 10

## 2014-12-19 MED ORDER — HEPARIN SOD (PORK) LOCK FLUSH 100 UNIT/ML IV SOLN
500.0000 [IU] | Freq: Every day | INTRAVENOUS | Status: AC | PRN
  Administered 2014-12-19: 500 [IU]
  Filled 2014-12-19: qty 5

## 2014-12-19 MED ORDER — SODIUM CHLORIDE 0.9 % IV SOLN
250.0000 mL | Freq: Once | INTRAVENOUS | Status: AC
  Administered 2014-12-19: 250 mL via INTRAVENOUS

## 2014-12-19 MED ORDER — DIPHENHYDRAMINE HCL 25 MG PO CAPS
ORAL_CAPSULE | ORAL | Status: AC
  Filled 2014-12-19: qty 1

## 2014-12-19 MED ORDER — DARBEPOETIN ALFA 300 MCG/0.6ML IJ SOSY
300.0000 ug | PREFILLED_SYRINGE | Freq: Once | INTRAMUSCULAR | Status: AC
  Administered 2014-12-19: 300 ug via SUBCUTANEOUS
  Filled 2014-12-19: qty 0.6

## 2014-12-19 MED ORDER — ACETAMINOPHEN 325 MG PO TABS
ORAL_TABLET | ORAL | Status: AC
  Filled 2014-12-19: qty 2

## 2014-12-19 MED ORDER — FUROSEMIDE 10 MG/ML IJ SOLN
20.0000 mg | Freq: Once | INTRAMUSCULAR | Status: AC
  Administered 2014-12-19: 20 mg via INTRAVENOUS

## 2014-12-19 MED ORDER — DIPHENHYDRAMINE HCL 25 MG PO CAPS
25.0000 mg | ORAL_CAPSULE | Freq: Once | ORAL | Status: AC
  Administered 2014-12-19: 25 mg via ORAL

## 2014-12-19 MED ORDER — ACETAMINOPHEN 325 MG PO TABS
650.0000 mg | ORAL_TABLET | Freq: Once | ORAL | Status: AC
  Administered 2014-12-19: 650 mg via ORAL

## 2014-12-19 NOTE — Telephone Encounter (Signed)
Gave and printed aptp scehd and avs for pt for DEC thru June 2017

## 2014-12-19 NOTE — Progress Notes (Signed)
Hematology and Oncology Follow Up Visit  [name] [id number] 10/22/1932 81 y.o. 12/19/2014 9:22 AM  CC: [name]. [name], MD, FCCP    Principle Diagnosis: A 81 year old gentleman with the following diagnoses:   1. Follicular lymphoma, grade 3, stage III, diagnosed 2007. Continued to be in remission. 2. Pancytopenia associated with mild neutropenia and macrocytosis possibly indicating early myelodysplasia. Bone marrow  was repeated on 06/22/2012 confirmed the evidence of myelodysplastic syndrome 3. Monoclonal gammopathy of undetermined significance.  Prior Therapy:  1. Status post CHOP and rituximab.  He had complete response to therapy concluded in 2007.  No further imaging has been indicated at this time. 2. Status post bone marrow biopsy in April 2011, did not really show any evidence of myelodysplasia or metastatic lymphoma at this time.  Current therapy: Aranesp 300 mcg every 2weeks to keep his hemoglobin close to 11. He also has been receiving intermittent PRBC transfusions to keep Hgb above 9.  Interim History: [name] presents today for a followup visit with his daughter. Since his last visit, he reports slight decline in his energy and performance status. He also has reported some slight lightheadedness but no syncope or falls. His mobility is limited but fairly stable. He does use a cane and drives short distances. He tries to avoid walking outside because of instability and overall deconditioning. He did report some occasional sore throat and dryness in his mouth.  He is not reporting any chest pain or dyspnea on exertion today. He has not reported any bleeding complications such as epistaxis or hematochezia. He does not report any recent hospitalization or illnesses.   He does not report any lymphadenopathy. Denies night sweats. He did not report any headaches or change in his vision. Does not report any seizures or confusion. He does not report any palpitation or  orthopnea. Has not reported any edema. Does not report any wheezing but still has exertional dyspnea.  He does not report any lymphadenopathy or petechiae. Has not reported any skin rashes or genitourinary complaints. Remainder of review of systems unremarkable.   Medications: I have reviewed the patient's current medications Current Outpatient Prescriptions  Medication Sig Dispense Refill  . albuterol (PROVENTIL HFA;VENTOLIN HFA) 108 (90 BASE) MCG/ACT inhaler Inhale 1-2 puffs into the lungs every 4 (four) hours as needed for wheezing or shortness of breath. (Patient taking differently: Take 2 puffs by mouth every 4-6 hours as needed for wheezing and shortness of breath) 8 g 5  . aspirin 81 MG tablet Take 81 mg by mouth every morning. (hold if bleeding)    . calcium-vitamin D (OS-CAL 500 + D) 500-200 MG-UNIT per tablet Take 1 tablet by mouth 2 (two) times daily.     . cetirizine (ZYRTEC) 10 MG tablet Take 10 mg by mouth at bedtime as needed (for drippy nose).     . Cholecalciferol (VITAMIN D) 1000 UNITS capsule Take 1,000 Units by mouth 2 (two) times daily.      [name] dextromethorphan-guaiFENesin (MUCINEX DM) 30-600 MG per 12 hr tablet Take 1 tablet by mouth every 12 (twelve) hours as needed for cough.     . docusate sodium (COLACE) 100 MG capsule Take 100 mg by mouth daily as needed for moderate constipation.     . DULERA 200-5 MCG/ACT AERO INHALE 2 PUFFS FIRST THING EVERY MORNING AND THEN ANOTHER 2 PUFFS ABOUT 12 HOURS LATER 1 Inhaler 5  . famotidine (PEPCID) 20 MG tablet Take 20 mg by mouth at bedtime.     [name]  finasteride (PROSCAR) 5 MG tablet Take 5 mg by mouth every morning.     . furosemide (LASIX) 40 MG tablet TAKE 1 TABLET BY MOUTH DAILY. MAY TAKE 1 EXTRA TABLET DAILY IF NEEDED FOR SWELLING IN LEGS 30 tablet 5  . hydrocortisone cream 1 % Apply 1 application topically 2 (two) times daily as needed (rash or itchy skin).     . hydrOXYzine (ATARAX/VISTARIL) 25 MG tablet Take 1-2 tablets by mouth  every 4-6 hours as needed for itching    . KLOR-CON M20 20 MEQ tablet TAKE 1 TABLET BY MOUTH EVERY DAY (Patient taking differently: TAKE 1 TABLET BY MOUTH EVERY MORNING) 30 tablet 5  . levothyroxine (SYNTHROID, LEVOTHROID) 25 MCG tablet TAKE 1 TABLET BY MOUTH EVERY MORNING BEFORE BREAKFAST 30 tablet 3  . lidocaine-prilocaine (EMLA) cream Apply 1 application topically as needed. 30 g 2  . metFORMIN (GLUCOPHAGE) 500 MG tablet TAKE 1 TABLET BY MOUTH TWICE A DAY WITH FOOD (Patient taking differently: TAKE 1/2 TABLET BY MOUTH TWICE A DAY AFTER MEALS) 60 tablet 5  . methylcellulose (CITRUCEL) oral powder Take 1 packet by mouth daily.     . Multiple Vitamin (MULTIVITAMIN) tablet Take 1 tablet by mouth daily.      [name] NASONEX 50 MCG/ACT nasal spray USE 2 SPRAYS NASALLY TWICE DAILY 17 g 11  . nitroGLYCERIN (NITROSTAT) 0.4 MG SL tablet Place 1 tablet (0.4 mg total) under the tongue every 5 (five) minutes as needed for chest pain. 90 tablet 3  . Nutritional Supplements (ADVANCE MULTI-VITAMIN/MINERALS PO) Take 1 tablet by mouth every morning.    [name] omeprazole (PRILOSEC) 20 MG capsule Take 40 mg by mouth daily before breakfast.     . oxybutynin (DITROPAN-XL) 10 MG 24 hr tablet Take 10 mg by mouth every morning.     . polyethylene glycol powder (MIRALAX) powder Take 17 g by mouth daily as needed for mild constipation or moderate constipation.     . sulindac (CLINORIL) 200 MG tablet TAKE 1 TABLET BY MOUTH TWICE A DAY 60 tablet 5  . traMADol (ULTRAM) 50 MG tablet Take 1/2 to one  tablet by mouth every 4 hours as needed for severe pain or cough 40 tablet 0  . traZODone (DESYREL) 150 MG tablet TAKE 1/3 TABLET BY MOUTH AT BEDTIME 30 tablet 3   No current facility-administered medications for this visit.     Allergies: No Known Allergies  Past Medical History, Surgical history, Social history, and Family History were reviewed and updated.    Physical Exam: Blood pressure 112/63, pulse 99, temperature 97.8 F  (36.6 C), temperature source Oral, resp. rate 18, height $RemoveBe'5\' 11"'csWEBIcti$  (1.803 m), weight 241 lb 14.4 oz (109.725 kg), SpO2 96 %. ECOG: 1 General appearance: chronically ill-appearing elderly gentleman without distress. Slightly pale. Head: Normocephalic, without obvious abnormality oral ulcers or lesions. No thrush noted. Neck: no adenopathy. No palpable masses. Lymph nodes: Cervical, supraclavicular, and axillary nodes normal. Heart:regular rate and rhythm, S1, S2 normal, no murmur, click, rub or gallop Lung:chest clear, no wheezing, rales, normal symmetric air entry. Abdomen: soft, non-tender, without masses or organomegaly. No rebound or guarding. EXT:no erythema, induration, or nodules. No edema noted. Skin: No rashes or lesions noted.    Lab Results: Lab Results  Component Value Date   WBC 1.2* 12/19/2014   HGB 8.9* 12/19/2014   HCT 25.7* 12/19/2014   MCV 99.6* 12/19/2014   PLT 86* 12/19/2014     Chemistry      Component Value  Date/Time   NA 135 11/28/2014 1150   NA 137 01/05/2013 1501   K 4.0 11/28/2014 1150   K 3.8 01/05/2013 1501   CL 98* 11/28/2014 1150   CL 99 06/08/2012 1257   CO2 28 11/28/2014 1150   CO2 26 01/05/2013 1501   BUN 15 11/28/2014 1150   BUN 13.9 01/05/2013 1501   CREATININE 0.73 11/28/2014 1150   CREATININE 0.8 01/05/2013 1501      Component Value Date/Time   CALCIUM 9.8 11/28/2014 1150   CALCIUM 10.1 01/05/2013 1501   ALKPHOS 82 12/06/2012 1850   ALKPHOS 48 08/10/2012 1117   AST 17 12/06/2012 1850   AST 17 08/10/2012 1117   ALT <5 12/06/2012 1850   ALT 14 08/10/2012 1117   BILITOT 0.7 12/06/2012 1850   BILITOT 0.82 08/10/2012 1117      Impression and Plan:  This is a 81 year old gentleman with the following issues:  1. Follicular lymphoma diagnosed in 2007. He is status post systemic therapy outlined above and continues to be disease free and likely cured from his malignancy. Further imaging studies will be done as needed. 2. Pancytopenia  due to myelodysplastic syndrome. He is status post bone marrow biopsy was done on 06/22/2012.  He is to continue on Aranesp 300 mcg every 2 weeks as well as intermittent transfusions. His hemoglobin is 8.9 and slightly symptomatic with lightheadedness. The plan is to proceed with 1 unit of packed red cell transfusion for symptomatic anemia. 3. Neutropenia: Neutropenic precautions were given to the patient today. No recent infections at this time. No need for any growth factor support at this time. 4. Monoclonal gammopathy.  His M-spike had been less than 1 g/dL, his bone marrow biopsy continued to show a less than 8% plasma cells indicating most likely multiple myeloma.  Last SPEP was done in September 2016 and this will be repeated annually. 5. Thrombocytopenia: Likely related to his myelodysplasia without any signs or symptoms of bleeding. 6. IV access: Port-A-Cath inserted without any complication. EMLA cream available to the patient. 7. Followup every 2 weeks for an injection and possible transfusion. He will have a clinical visit in March 2016.  [facility], MD 11/23/20169:22 AM

## 2014-12-20 LAB — TYPE AND SCREEN
ABO/RH(D): A POS
Antibody Screen: NEGATIVE
UNIT DIVISION: 0

## 2014-12-27 ENCOUNTER — Ambulatory Visit (HOSPITAL_COMMUNITY)
Admission: RE | Admit: 2014-12-27 | Discharge: 2014-12-27 | Disposition: A | Discharge status: Home / Self Care | Payer: Medicare Other | Source: Ambulatory Visit | Attending: Oncology | Admitting: Oncology

## 2014-12-27 DIAGNOSIS — D649 Anemia, unspecified: Secondary | ICD-10-CM

## 2014-12-27 DIAGNOSIS — D469 Myelodysplastic syndrome, unspecified: Secondary | ICD-10-CM | POA: Insufficient documentation

## 2015-01-02 ENCOUNTER — Ambulatory Visit (HOSPITAL_BASED_OUTPATIENT_CLINIC_OR_DEPARTMENT_OTHER): Payer: Medicare Other

## 2015-01-02 ENCOUNTER — Other Ambulatory Visit: Payer: Self-pay | Admitting: *Deleted

## 2015-01-02 ENCOUNTER — Telehealth: Payer: Self-pay | Admitting: Internal Medicine

## 2015-01-02 ENCOUNTER — Ambulatory Visit: Payer: Medicare Other

## 2015-01-02 ENCOUNTER — Other Ambulatory Visit (HOSPITAL_BASED_OUTPATIENT_CLINIC_OR_DEPARTMENT_OTHER): Payer: Medicare Other

## 2015-01-02 VITALS — BP 119/82 | HR 101 | Temp 98.4°F

## 2015-01-02 VITALS — BP 124/69 | HR 76 | Temp 97.3°F | Resp 17

## 2015-01-02 DIAGNOSIS — D709 Neutropenia, unspecified: Secondary | ICD-10-CM | POA: Diagnosis not present

## 2015-01-02 DIAGNOSIS — D61818 Other pancytopenia: Secondary | ICD-10-CM | POA: Diagnosis not present

## 2015-01-02 DIAGNOSIS — D469 Myelodysplastic syndrome, unspecified: Secondary | ICD-10-CM | POA: Diagnosis present

## 2015-01-02 DIAGNOSIS — D649 Anemia, unspecified: Secondary | ICD-10-CM

## 2015-01-02 DIAGNOSIS — D631 Anemia in chronic kidney disease: Secondary | ICD-10-CM

## 2015-01-02 DIAGNOSIS — D696 Thrombocytopenia, unspecified: Secondary | ICD-10-CM

## 2015-01-02 DIAGNOSIS — Z95828 Presence of other vascular implants and grafts: Secondary | ICD-10-CM

## 2015-01-02 DIAGNOSIS — N189 Chronic kidney disease, unspecified: Secondary | ICD-10-CM

## 2015-01-02 DIAGNOSIS — D472 Monoclonal gammopathy: Secondary | ICD-10-CM | POA: Diagnosis not present

## 2015-01-02 LAB — CBC WITH DIFFERENTIAL/PLATELET
BASO%: 0.9 % (ref 0.0–2.0)
Basophils Absolute: 0 10*3/uL (ref 0.0–0.1)
EOS ABS: 0 10*3/uL (ref 0.0–0.5)
EOS%: 0.9 % (ref 0.0–7.0)
HEMATOCRIT: 25.4 % — AB (ref 38.4–49.9)
HGB: 8.7 g/dL — ABNORMAL LOW (ref 13.0–17.1)
LYMPH#: 0.7 10*3/uL — AB (ref 0.9–3.3)
LYMPH%: 64 % — ABNORMAL HIGH (ref 14.0–49.0)
MCH: 35.1 pg — ABNORMAL HIGH (ref 27.2–33.4)
MCHC: 34.3 g/dL (ref 32.0–36.0)
MCV: 102.4 fL — ABNORMAL HIGH (ref 79.3–98.0)
MONO#: 0 10*3/uL — AB (ref 0.1–0.9)
MONO%: 1.8 % (ref 0.0–14.0)
NEUT%: 32.4 % — ABNORMAL LOW (ref 39.0–75.0)
NEUTROS ABS: 0.4 10*3/uL — AB (ref 1.5–6.5)
NRBC: 0 % (ref 0–0)
PLATELETS: 64 10*3/uL — AB (ref 140–400)
RBC: 2.48 10*6/uL — ABNORMAL LOW (ref 4.20–5.82)
RDW: 22.4 % — AB (ref 11.0–14.6)
WBC: 1.1 10*3/uL — AB (ref 4.0–10.3)

## 2015-01-02 LAB — PREPARE RBC (CROSSMATCH)

## 2015-01-02 LAB — HOLD TUBE, BLOOD BANK

## 2015-01-02 MED ORDER — SODIUM CHLORIDE 0.9 % IV SOLN
250.0000 mL | Freq: Once | INTRAVENOUS | Status: AC
  Administered 2015-01-02: 250 mL via INTRAVENOUS

## 2015-01-02 MED ORDER — DIPHENHYDRAMINE HCL 25 MG PO CAPS
ORAL_CAPSULE | ORAL | Status: AC
  Filled 2015-01-02: qty 1

## 2015-01-02 MED ORDER — TRAZODONE HCL 150 MG PO TABS: ORAL_TABLET | ORAL | Status: DC

## 2015-01-02 MED ORDER — ACETAMINOPHEN 325 MG PO TABS
ORAL_TABLET | ORAL | Status: AC
  Filled 2015-01-02: qty 2

## 2015-01-02 MED ORDER — ACETAMINOPHEN 325 MG PO TABS
650.0000 mg | ORAL_TABLET | Freq: Once | ORAL | Status: AC
  Administered 2015-01-02: 650 mg via ORAL

## 2015-01-02 MED ORDER — SODIUM CHLORIDE 0.9 % IJ SOLN
10.0000 mL | INTRAMUSCULAR | Status: AC | PRN
  Administered 2015-01-02: 10 mL
  Filled 2015-01-02: qty 10

## 2015-01-02 MED ORDER — HEPARIN SOD (PORK) LOCK FLUSH 100 UNIT/ML IV SOLN
500.0000 [IU] | Freq: Every day | INTRAVENOUS | Status: AC | PRN
  Administered 2015-01-02: 500 [IU]
  Filled 2015-01-02: qty 5

## 2015-01-02 MED ORDER — SODIUM CHLORIDE 0.9 % IJ SOLN
10.0000 mL | INTRAMUSCULAR | Status: DC | PRN
  Administered 2015-01-02: 10 mL via INTRAVENOUS
  Filled 2015-01-02: qty 10

## 2015-01-02 MED ORDER — DARBEPOETIN ALFA 300 MCG/0.6ML IJ SOSY
300.0000 ug | PREFILLED_SYRINGE | Freq: Once | INTRAMUSCULAR | Status: AC
  Administered 2015-01-02: 300 ug via SUBCUTANEOUS
  Filled 2015-01-02: qty 0.6

## 2015-01-02 MED ORDER — DIPHENHYDRAMINE HCL 25 MG PO CAPS
25.0000 mg | ORAL_CAPSULE | Freq: Once | ORAL | Status: AC
  Administered 2015-01-02: 25 mg via ORAL

## 2015-01-02 NOTE — Patient Instructions (Signed)

## 2015-01-02 NOTE — Patient Instructions (Signed)

## 2015-01-02 NOTE — Telephone Encounter (Signed)
Spoke with the pt's daughter  Trazodone was refilled  Nothing further needed

## 2015-01-03 LAB — TYPE AND SCREEN
ABO/RH(D): A POS
ANTIBODY SCREEN: NEGATIVE
UNIT DIVISION: 0

## 2015-01-09 ENCOUNTER — Other Ambulatory Visit: Payer: Self-pay | Admitting: Internal Medicine

## 2015-01-11 ENCOUNTER — Encounter: Payer: Self-pay | Admitting: Oncology

## 2015-01-13 ENCOUNTER — Emergency Department (HOSPITAL_COMMUNITY): Payer: Medicare Other

## 2015-01-13 ENCOUNTER — Emergency Department (HOSPITAL_COMMUNITY)
Admission: EM | Admit: 2015-01-13 | Discharge: 2015-01-13 | Disposition: A | Discharge status: Home / Self Care | Payer: Medicare Other | Attending: Emergency Medicine | Admitting: Emergency Medicine

## 2015-01-13 DIAGNOSIS — M1990 Unspecified osteoarthritis, unspecified site: Secondary | ICD-10-CM | POA: Diagnosis not present

## 2015-01-13 DIAGNOSIS — Z79899 Other long term (current) drug therapy: Secondary | ICD-10-CM | POA: Insufficient documentation

## 2015-01-13 DIAGNOSIS — R05 Cough: Secondary | ICD-10-CM | POA: Diagnosis not present

## 2015-01-13 DIAGNOSIS — R51 Headache: Secondary | ICD-10-CM | POA: Diagnosis not present

## 2015-01-13 DIAGNOSIS — Z7982 Long term (current) use of aspirin: Secondary | ICD-10-CM | POA: Diagnosis not present

## 2015-01-13 DIAGNOSIS — R110 Nausea: Secondary | ICD-10-CM | POA: Insufficient documentation

## 2015-01-13 DIAGNOSIS — E669 Obesity, unspecified: Secondary | ICD-10-CM | POA: Diagnosis not present

## 2015-01-13 DIAGNOSIS — Y9301 Activity, walking, marching and hiking: Secondary | ICD-10-CM | POA: Insufficient documentation

## 2015-01-13 DIAGNOSIS — I509 Heart failure, unspecified: Secondary | ICD-10-CM | POA: Diagnosis not present

## 2015-01-13 DIAGNOSIS — W1839XA Other fall on same level, initial encounter: Secondary | ICD-10-CM | POA: Diagnosis not present

## 2015-01-13 DIAGNOSIS — Z8579 Personal history of other malignant neoplasms of lymphoid, hematopoietic and related tissues: Secondary | ICD-10-CM | POA: Diagnosis not present

## 2015-01-13 DIAGNOSIS — Y998 Other external cause status: Secondary | ICD-10-CM | POA: Diagnosis not present

## 2015-01-13 DIAGNOSIS — S20212A Contusion of left front wall of thorax, initial encounter: Secondary | ICD-10-CM | POA: Insufficient documentation

## 2015-01-13 DIAGNOSIS — Z791 Long term (current) use of non-steroidal anti-inflammatories (NSAID): Secondary | ICD-10-CM | POA: Diagnosis not present

## 2015-01-13 DIAGNOSIS — Y92002 Bathroom of unspecified non-institutional (private) residence single-family (private) house as the place of occurrence of the external cause: Secondary | ICD-10-CM | POA: Diagnosis not present

## 2015-01-13 DIAGNOSIS — Z87891 Personal history of nicotine dependence: Secondary | ICD-10-CM | POA: Diagnosis not present

## 2015-01-13 DIAGNOSIS — E119 Type 2 diabetes mellitus without complications: Secondary | ICD-10-CM | POA: Insufficient documentation

## 2015-01-13 DIAGNOSIS — R42 Dizziness and giddiness: Secondary | ICD-10-CM | POA: Diagnosis not present

## 2015-01-13 DIAGNOSIS — D61818 Other pancytopenia: Secondary | ICD-10-CM

## 2015-01-13 DIAGNOSIS — S299XXA Unspecified injury of thorax, initial encounter: Secondary | ICD-10-CM | POA: Diagnosis present

## 2015-01-13 DIAGNOSIS — W19XXXA Unspecified fall, initial encounter: Secondary | ICD-10-CM

## 2015-01-13 DIAGNOSIS — Z950 Presence of cardiac pacemaker: Secondary | ICD-10-CM | POA: Diagnosis not present

## 2015-01-13 LAB — URINALYSIS, ROUTINE W REFLEX MICROSCOPIC
Bilirubin Urine: NEGATIVE
GLUCOSE, UA: NEGATIVE mg/dL
Hgb urine dipstick: NEGATIVE
LEUKOCYTES UA: NEGATIVE
NITRITE: NEGATIVE
PH: 6 (ref 5.0–8.0)
PROTEIN: NEGATIVE mg/dL
Specific Gravity, Urine: 1.022 (ref 1.005–1.030)

## 2015-01-13 LAB — BASIC METABOLIC PANEL
Anion gap: 12 (ref 5–15)
BUN: 17 mg/dL (ref 6–20)
CHLORIDE: 100 mmol/L — AB (ref 101–111)
CO2: 26 mmol/L (ref 22–32)
CREATININE: 0.97 mg/dL (ref 0.61–1.24)
Calcium: 9.8 mg/dL (ref 8.9–10.3)
GFR calc Af Amer: >60 mL/min (ref >60)
GFR calc non Af Amer: >60 mL/min (ref >60)
Glucose, Bld: 183 mg/dL — ABNORMAL HIGH (ref 65–99)
POTASSIUM: 3.5 mmol/L (ref 3.5–5.1)
Sodium: 138 mmol/L (ref 135–145)

## 2015-01-13 LAB — I-STAT TROPONIN, ED: Troponin i, poc: 0.02 ng/mL (ref 0.00–0.08)

## 2015-01-13 LAB — CBG MONITORING, ED: Glucose-Capillary: 224 mg/dL — ABNORMAL HIGH (ref 65–99)

## 2015-01-13 LAB — SAMPLE TO BLOOD BANK

## 2015-01-13 MED ORDER — ACETAMINOPHEN 500 MG PO TABS
1000.0000 mg | ORAL_TABLET | Freq: Once | ORAL | Status: AC
  Administered 2015-01-13: 1000 mg via ORAL
  Filled 2015-01-13: qty 2

## 2015-01-13 MED ORDER — SODIUM CHLORIDE 0.9 % IV BOLUS (SEPSIS): 500.0000 mL | Freq: Once | INTRAVENOUS | Status: DC

## 2015-01-13 NOTE — Discharge Instructions (Signed)
Your urine showed that you're possibly dehydrated. I recommend you increase your fluid intake at home. Please follow-up with your primary care doctor as needed. Please follow-up with your hematologist as scheduled.   Chest Contusion A chest contusion is a deep bruise on your chest area. Contusions are the result of an injury that caused bleeding under the skin. A chest contusion may involve bruising of the skin, muscles, or ribs. The contusion may turn blue, purple, or yellow. Minor injuries will give you a painless contusion, but more severe contusions may stay painful and swollen for a few weeks. CAUSES  A contusion is usually caused by a blow, trauma, or direct force to an area of the body. SYMPTOMS   Swelling and redness of the injured area.  Discoloration of the injured area.  Tenderness and soreness of the injured area.  Pain. DIAGNOSIS  The diagnosis can be made by taking a history and performing a physical exam. An X-ray, CT scan, or MRI may be needed to determine if there were any associated injuries, such as broken bones (fractures) or internal injuries. TREATMENT  Often, the best treatment for a chest contusion is resting, icing, and applying cold compresses to the injured area. Deep breathing exercises may be recommended to reduce the risk of pneumonia. Over-the-counter medicines may also be recommended for pain control. HOME CARE INSTRUCTIONS   Put ice on the injured area.  Put ice in a plastic bag.  Place a towel between your skin and the bag.  Leave the ice on for 15-20 minutes, 03-04 times a day.  Only take over-the-counter or prescription medicines as directed by your caregiver. Your caregiver may recommend avoiding anti-inflammatory medicines (aspirin, ibuprofen, and naproxen) for 48 hours because these medicines may increase bruising.  Rest the injured area.  Perform deep-breathing exercises as directed by your caregiver.  Stop smoking if you smoke.  Do not  lift objects over 5 pounds (2.3 kg) for 3 days or longer if recommended by your caregiver. SEEK IMMEDIATE MEDICAL CARE IF:   You have increased bruising or swelling.  You have pain that is getting worse.  You have difficulty breathing.  You have dizziness, weakness, or fainting.  You have blood in your urine or stool.  You cough up or vomit blood.  Your swelling or pain is not relieved with medicines. MAKE SURE YOU:   Understand these instructions.  Will watch your condition.  Will get help right away if you are not doing well or get worse.   This information is not intended to replace advice given to you by your health care provider. Make sure you discuss any questions you have with your health care provider.   Document Released: 10/07/2000 Document Revised: 10/07/2011 Document Reviewed: 07/06/2011 Elsevier Interactive Patient Education [employer].
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
## 2015-01-13 NOTE — ED Provider Notes (Signed)
By signing my name below, I, Hansel Feinstein, attest that this documentation has been prepared under the direction and in the presence of [place], DO. Electronically Signed: Hansel Feinstein, ED Scribe. 01/13/2015. 4:35 AM.   TIME SEEN: 4:21 AM   CHIEF COMPLAINT: . Chief Complaint  Patient presents with  . Fall    HPI:  HPI Comments: [name] is a 81 y.o. male with h/o second degree Mobitz II AV block s/p pacemaker, PAF, pancytopenia secondary to MDS, CHF, type II DM, HLD who presents to the Emergency Department complaining of a fall in his bathroom that occurred this evening. Is complaining of right lateral rib pain after the fall. Pt states he was walking to the restroom when he slowly fell to the floor, landing on his buttock and left side. No LOC or head injury. Pt also reports some transient weakness and difficulty getting up secondary to weakness. He also complains of a dry cough for 3 weeks and a very mild frontal HA currently. Pt also notes daily dull aching pain at the previous site of a pacemaker in the left chest wall that is been present for many months. Pt ambulates with a cane at baseline. He takes qd 81 mg ASA. Pt receives weekly blood transfusions secondary to his pancytopenia and is due for his next transfusion in 3 days. He denies fevers, emesis, increased diarrhea from baseline, abdominal pain, numbness, paresthesia, focal weakness, additional injuries. No chest pain or shortness of breath. No palpitations. States he is feeling back to his baseline.  ROS: See HPI Constitutional: no fever  Eyes: no drainage  ENT: no runny nose   Cardiovascular:  no chest pain  Resp: no SOB. Positive for cough  GI: no vomiting, diarrhea. Positive for nausea GU: no dysuria Integumentary: no rash  Allergy: no hives  Musculoskeletal: no leg swelling. Positive for lower back pain, left costal pain  Neurological: no slurred speech, numbness, paresthesia. Positive for HA, lightheadedness,  generalized weakness.  ROS otherwise negative  PAST MEDICAL HISTORY/PAST SURGICAL HISTORY:  Past Medical History  Diagnosis Date  . Allergic rhinitis   . Hyperlipidemia   . Exogenous obesity   . Edema     venous insufficiency  . Second degree Mobitz II AV block 12/11/08    with transient syncope, resolved s/p PPM  . Paroxysmal atrial fibrillation ([place]) 03/16/11    diagnosed by PPM interrogation  . Pancytopenia ([place])   . Postural dizziness   . CHF (congestive heart failure) ([place])   . Pacemaker   . Dyspnea     "all the time w/the temporary pacer" (12/29/2012)  . Type II diabetes mellitus ([place]) 2012  . History of blood transfusion     "3 recently" (12/29/2012)  . Daily headache     "recently w/the ATB" (12/29/2012)  . Arthritis     "hips; knees" (12/29/2012)  . 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   . Poor venous access     MEDICATIONS:  Prior to Admission medications   Medication Sig Start Date End Date Taking? Authorizing Provider  albuterol (PROVENTIL HFA;VENTOLIN HFA) 108 (90 BASE) MCG/ACT inhaler Inhale 1-2 puffs into the lungs every 4 (four) hours as needed for wheezing or shortness of breath. Patient taking differently: Take 2 puffs by mouth every 4-6 hours as needed for wheezing and shortness of breath 02/20/14   Tammy S Parrett, NP  aspirin 81 MG tablet Take 81 mg  by mouth every morning. (hold if bleeding)    Historical Provider, MD  calcium-vitamin D (OS-CAL 500 + D) 500-200 MG-UNIT per tablet Take 1 tablet by mouth 2 (two) times daily.     Historical Provider, MD  cetirizine (ZYRTEC) 10 MG tablet Take 10 mg by mouth at bedtime as needed (for drippy nose).     Historical Provider, MD  Cholecalciferol (VITAMIN D) 1000 UNITS capsule Take 1,000 Units by mouth 2 (two) times daily.      Historical Provider, MD  dextromethorphan-guaiFENesin (MUCINEX DM) 30-600 MG per 12 hr tablet Take 1 tablet by mouth every 12 (twelve)  hours as needed for cough.  06/26/10   Tammy S Parrett, NP  docusate sodium (COLACE) 100 MG capsule Take 100 mg by mouth daily as needed for moderate constipation.     Historical Provider, MD  DULERA 200-5 MCG/ACT AERO INHALE 2 PUFFS FIRST THING EVERY MORNING AND THEN ANOTHER 2 PUFFS ABOUT 12 HOURS LATER 09/28/14   Tanda Rockers, MD  famotidine (PEPCID) 20 MG tablet Take 20 mg by mouth at bedtime.     Historical Provider, MD  finasteride (PROSCAR) 5 MG tablet Take 5 mg by mouth every morning.     Historical Provider, MD  furosemide (LASIX) 40 MG tablet TAKE 1 TABLET BY MOUTH DAILY. MAY TAKE 1 EXTRA TABLET DAILY IF NEEDED FOR SWELLING IN LEGS 10/26/14   Tanda Rockers, MD  hydrocortisone cream 1 % Apply 1 application topically 2 (two) times daily as needed (rash or itchy skin).     Historical Provider, MD  hydrOXYzine (ATARAX/VISTARIL) 25 MG tablet Take 1-2 tablets by mouth every 4-6 hours as needed for itching    Historical Provider, MD  KLOR-CON M20 20 MEQ tablet TAKE 1 TABLET BY MOUTH EVERY DAY 01/09/15   Tanda Rockers, MD  levothyroxine (SYNTHROID, LEVOTHROID) 25 MCG tablet TAKE 1 TABLET BY MOUTH EVERY MORNING BEFORE BREAKFAST 09/13/14   Tanda Rockers, MD  lidocaine-prilocaine (EMLA) cream Apply 1 application topically as needed. 12/05/14   Wyatt Portela, MD  metFORMIN (GLUCOPHAGE) 500 MG tablet TAKE 1 TABLET BY MOUTH TWICE A DAY WITH FOOD Patient taking differently: TAKE 1/2 TABLET BY MOUTH TWICE A DAY AFTER MEALS 02/23/14   Tanda Rockers, MD  methylcellulose (CITRUCEL) oral powder Take 1 packet by mouth daily.     Historical Provider, MD  Multiple Vitamin (MULTIVITAMIN) tablet Take 1 tablet by mouth daily.      Historical Provider, MD  NASONEX 50 MCG/ACT nasal spray USE 2 SPRAYS NASALLY TWICE DAILY 10/18/14   Tanda Rockers, MD  nitroGLYCERIN (NITROSTAT) 0.4 MG SL tablet Place 1 tablet (0.4 mg total) under the tongue every 5 (five) minutes as needed for chest pain. 06/04/14   Thompson Grayer, MD   Nutritional Supplements (ADVANCE MULTI-VITAMIN/MINERALS PO) Take 1 tablet by mouth every morning.    Historical Provider, MD  omeprazole (PRILOSEC) 20 MG capsule Take 40 mg by mouth daily before breakfast.     Historical Provider, MD  oxybutynin (DITROPAN-XL) 10 MG 24 hr tablet Take 10 mg by mouth every morning.     Historical Provider, MD  polyethylene glycol powder (MIRALAX) powder Take 17 g by mouth daily as needed for mild constipation or moderate constipation.     Historical Provider, MD  sulindac (CLINORIL) 200 MG tablet TAKE 1 TABLET BY MOUTH TWICE A DAY 08/22/14   Tanda Rockers, MD  traMADol (ULTRAM) 50 MG tablet Take 1/2 to one  tablet by mouth every 4 hours as needed for severe pain or cough 07/18/14   Tanda Rockers, MD  traZODone (DESYREL) 150 MG tablet 2/3 to 1 tablet at bedtime 01/02/15   Tanda Rockers, MD    ALLERGIES:  No Known Allergies  SOCIAL HISTORY:  Social History  Substance Use Topics  . Smoking status: Former Smoker -- 1.00 packs/day for 30 years    Types: Cigarettes    Quit date: 01/27/1968  . Smokeless tobacco: Former Systems developer    Types: Chew  . Alcohol Use: No    FAMILY HISTORY: Family History  Problem Relation Age of Onset  . Diabetes Mother   . Liver cancer Brother   . Cancer Sister     EXAM: BP 130/68 mmHg  Pulse 104  Temp(Src) 98.3 F (36.8 C) (Oral)  Resp 16  SpO2 97% CONSTITUTIONAL: Alert and oriented and responds appropriately to questions. In no distress, pleasant; GCS 15 Elderly; hard of hearing. Poor historian  HEAD: Normocephalic; atraumatic EYES: Conjunctivae clear, PERRL, EOMI ENT: normal nose; no rhinorrhea; moist mucous membranes; pharynx without lesions noted; no dental injury; no septal hematoma; pale lips and face NECK: Supple, no meningismus, no LAD; no midline spinal tenderness, step-off or deformity CARD: RRR; S1 and S2 appreciated; no murmurs, no clicks, no rubs, no gallops RESP: Normal chest excursion without splinting or  tachypnea; breath sounds clear and equal bilaterally; no wheezes, no rhonchi, no rales; no hypoxia or respiratory distress CHEST:  chest wall stable, no crepitus or ecchymosis or deformity, tender over the right lateral chest; he has a right sided pacemaker and port; both without erythema, warmth, fluctuance, induration or tenderness.  ABD/GI: Normal bowel sounds; non-distended; soft, non-tender, no rebound, no guarding PELVIS:  stable, nontender to palpation BACK:  The back appears normal and is non-tender to palpation, there is no CVA tenderness; no midline spinal tenderness, step-off or deformity EXT: Normal ROM in all joints; non-tender to palpation; no edema; normal capillary refill; no cyanosis, no bony tenderness or bony deformity of patient's extremities, no joint effusion, no ecchymosis or lacerations    SKIN: Normal color for age and race; warm NEURO: Moves all extremities equally, sensation to light touch intact diffusely, cranial nerves II through XII intact, normal gait PSYCH: The patient's mood and manner are appropriate. Grooming and personal hygiene are appropriate.  MEDICAL DECISION MAKING:  Patient here with a fall to the ground. He states that he felt like he was weak and slowly went down to the ground hitting his right side on the toilet. No head injury or loss of consciousness. No focal neurologic deficits. Denies any recent infectious symptoms. Has had intermittent diarrhea that is unchanged. No vomiting. No abdominal pain. No numbness continuing or focal weakness. Has history of pancytopenia and gets regular transfusions every 2 weeks. Is due for transfusion next week. Is requesting Tylenol for pain.  ED PROGRESS: Patient's labs show pancytopenia which is chronic. Troponin negative. Urine does show large ketones but he has been in the room drinking several glasses of water. Discussed with family that if he can increase his fluid intake orally after this is just as good as IV  fluids. Heart rate is in the low 100s but he is otherwise hemodynamically stable. Afebrile. Able to ambulate in the restroom. X-ray shows no fracture, edema, pneumothorax or pneumonia. Tylenol has improved his pain. I feel he is safe to be discharged home with close outpatient follow-up. Patient and family at bedside verbalize understanding and  are comfortable with this plan. Discussed at length return precautions.    I personally performed the services described in this documentation, which was scribed in my presence. The recorded information has been reviewed and is accurate.     [place], DO 01/13/15 [phone]

## 2015-01-14 ENCOUNTER — Telehealth (HOSPITAL_BASED_OUTPATIENT_CLINIC_OR_DEPARTMENT_OTHER): Payer: Self-pay | Admitting: Emergency Medicine

## 2015-01-14 ENCOUNTER — Telehealth: Payer: Self-pay | Admitting: Internal Medicine

## 2015-01-14 LAB — CBC WITH DIFFERENTIAL/PLATELET
BASOS PCT: 0 %
Basophils Absolute: 0 10*3/uL (ref 0.0–0.1)
EOS PCT: 0 %
Eosinophils Absolute: 0 10*3/uL (ref 0.0–0.7)
HCT: 25.2 % — ABNORMAL LOW (ref 39.0–52.0)
HEMOGLOBIN: 8.7 g/dL — AB (ref 13.0–17.0)
LYMPHS PCT: 29 %
Lymphs Abs: 0.3 10*3/uL — ABNORMAL LOW (ref 0.7–4.0)
MCH: 35.7 pg — AB (ref 26.0–34.0)
MCHC: 34.5 g/dL (ref 30.0–36.0)
MCV: 103.3 fL — AB (ref 78.0–100.0)
Monocytes Absolute: 0 10*3/uL — ABNORMAL LOW (ref 0.1–1.0)
Monocytes Relative: 2 %
NEUTROS PCT: 69 %
Neutro Abs: 0.6 10*3/uL — ABNORMAL LOW (ref 1.7–7.7)
PLATELETS: 65 10*3/uL — AB (ref 150–400)
RBC: 2.44 MIL/uL — ABNORMAL LOW (ref 4.22–5.81)
RDW: 23 % — AB (ref 11.5–15.5)
WBC: 0.9 10*3/uL — CL (ref 4.0–10.5)

## 2015-01-14 NOTE — Telephone Encounter (Signed)
Results have been sent to Dr Shadad-Hematology Called to advise results have been sent.  NA, rang several times then call d/c'd [facility]

## 2015-01-14 NOTE — Telephone Encounter (Signed)
Spoke with Larene Beach with [facility].  Pt was seen in [facility] ED yesterday.  CBC with Diff ordered.  WBC was critical at 0.9.  However, lab tech who ran this did not call RN in ED to advise of critical (states pt had a critical in Nov, but it wasn't during this admission.  Tech marked results as last critical was during this admission, so per their protocol if this was true, no call report needed).  Larene Beach spoke with their flo manager who advised to call our office because pt no longer in ED and MW is listed as PCP to provide critical results.  Dr. [name], will you please review ED notes and results from yesterday's ED visit and advise if anything further needs to be done to address critical WBC?  Thank you.

## 2015-01-14 NOTE — Telephone Encounter (Signed)
Need to send result to his hematologist and call that office to let them know it's in their "box"

## 2015-01-15 NOTE — Telephone Encounter (Signed)
Attempted to contact Dr. Hazeline Junker office. Line rang busy x2. Will need to try back.

## 2015-01-16 ENCOUNTER — Ambulatory Visit (HOSPITAL_BASED_OUTPATIENT_CLINIC_OR_DEPARTMENT_OTHER): Payer: Medicare Other

## 2015-01-16 ENCOUNTER — Other Ambulatory Visit: Payer: Self-pay | Admitting: Internal Medicine

## 2015-01-16 ENCOUNTER — Other Ambulatory Visit (HOSPITAL_BASED_OUTPATIENT_CLINIC_OR_DEPARTMENT_OTHER): Payer: Medicare Other

## 2015-01-16 ENCOUNTER — Ambulatory Visit: Payer: Medicare Other

## 2015-01-16 ENCOUNTER — Other Ambulatory Visit: Payer: Self-pay | Admitting: *Deleted

## 2015-01-16 VITALS — BP 123/65 | HR 86 | Temp 98.8°F | Resp 16

## 2015-01-16 DIAGNOSIS — D631 Anemia in chronic kidney disease: Secondary | ICD-10-CM

## 2015-01-16 DIAGNOSIS — D469 Myelodysplastic syndrome, unspecified: Secondary | ICD-10-CM

## 2015-01-16 DIAGNOSIS — D649 Anemia, unspecified: Secondary | ICD-10-CM

## 2015-01-16 DIAGNOSIS — D61818 Other pancytopenia: Secondary | ICD-10-CM

## 2015-01-16 DIAGNOSIS — Z452 Encounter for adjustment and management of vascular access device: Secondary | ICD-10-CM | POA: Diagnosis not present

## 2015-01-16 DIAGNOSIS — Z95828 Presence of other vascular implants and grafts: Secondary | ICD-10-CM

## 2015-01-16 DIAGNOSIS — N189 Chronic kidney disease, unspecified: Secondary | ICD-10-CM

## 2015-01-16 LAB — CBC WITH DIFFERENTIAL/PLATELET
BASO%: 0 % (ref 0.0–2.0)
BASOS ABS: 0 10*3/uL (ref 0.0–0.1)
EOS%: 1.1 % (ref 0.0–7.0)
Eosinophils Absolute: 0 10*3/uL (ref 0.0–0.5)
HEMATOCRIT: 20 % — AB (ref 38.4–49.9)
HEMOGLOBIN: 6.8 g/dL — AB (ref 13.0–17.1)
LYMPH%: 45.7 % (ref 14.0–49.0)
MCH: 34.9 pg — AB (ref 27.2–33.4)
MCHC: 34 g/dL (ref 32.0–36.0)
MCV: 102.6 fL — AB (ref 79.3–98.0)
MONO#: 0 10*3/uL — AB (ref 0.1–0.9)
MONO%: 1.1 % (ref 0.0–14.0)
NEUT#: 0.5 10*3/uL — CL (ref 1.5–6.5)
NEUT%: 52.1 % (ref 39.0–75.0)
PLATELETS: 65 10*3/uL — AB (ref 140–400)
RBC: 1.95 10*6/uL — ABNORMAL LOW (ref 4.20–5.82)
RDW: 23.3 % — ABNORMAL HIGH (ref 11.0–14.6)
WBC: 0.9 10*3/uL — CL (ref 4.0–10.3)
lymph#: 0.4 10*3/uL — ABNORMAL LOW (ref 0.9–3.3)
nRBC: 0 % (ref 0–0)

## 2015-01-16 LAB — HOLD TUBE, BLOOD BANK

## 2015-01-16 LAB — PREPARE RBC (CROSSMATCH)

## 2015-01-16 MED ORDER — HEPARIN SOD (PORK) LOCK FLUSH 100 UNIT/ML IV SOLN
500.0000 [IU] | Freq: Every day | INTRAVENOUS | Status: AC | PRN
  Administered 2015-01-16: 500 [IU]
  Filled 2015-01-16: qty 5

## 2015-01-16 MED ORDER — DARBEPOETIN ALFA 300 MCG/0.6ML IJ SOSY
300.0000 ug | PREFILLED_SYRINGE | Freq: Once | INTRAMUSCULAR | Status: AC
  Administered 2015-01-16: 300 ug via SUBCUTANEOUS
  Filled 2015-01-16: qty 0.6

## 2015-01-16 MED ORDER — ACETAMINOPHEN 325 MG PO TABS
ORAL_TABLET | ORAL | Status: AC
  Filled 2015-01-16: qty 2

## 2015-01-16 MED ORDER — DIPHENHYDRAMINE HCL 25 MG PO CAPS: 25.0000 mg | ORAL_CAPSULE | Freq: Once | ORAL | Status: DC

## 2015-01-16 MED ORDER — SODIUM CHLORIDE 0.9 % IJ SOLN
10.0000 mL | INTRAMUSCULAR | Status: DC | PRN
  Administered 2015-01-16: 10 mL via INTRAVENOUS
  Filled 2015-01-16: qty 10

## 2015-01-16 MED ORDER — SODIUM CHLORIDE 0.9 % IJ SOLN
10.0000 mL | INTRAMUSCULAR | Status: AC | PRN
  Administered 2015-01-16: 10 mL
  Filled 2015-01-16: qty 10

## 2015-01-16 MED ORDER — SODIUM CHLORIDE 0.9 % IV SOLN
250.0000 mL | Freq: Once | INTRAVENOUS | Status: AC
  Administered 2015-01-16: 250 mL via INTRAVENOUS

## 2015-01-16 MED ORDER — ACETAMINOPHEN 325 MG PO TABS: 650.0000 mg | ORAL_TABLET | Freq: Once | ORAL | Status: DC

## 2015-01-16 MED ORDER — DIPHENHYDRAMINE HCL 25 MG PO CAPS
ORAL_CAPSULE | ORAL | Status: AC
  Filled 2015-01-16: qty 1

## 2015-01-16 NOTE — Patient Instructions (Signed)

## 2015-01-16 NOTE — Telephone Encounter (Signed)
Called Dr [name] office [phone] to verify labs results received - LM for Aleatha Borer to return call regarding labs faxed.

## 2015-01-16 NOTE — Patient Instructions (Signed)
Blood Transfusion   A blood transfusion is a procedure that gives you donated blood through an IV tube. You may need blood because of illness, surgery, or injury. The blood may come from a donor. The blood may also be your own blood that you donated earlier.  The blood you get is made up of different types of cells. You may get:    Red blood cells. These carry oxygen and replace lost blood.    Platelets. These control bleeding.    Plasma. This helps blood to clot.  If you have a clotting disorder, you may also get other types of blood products.   BEFORE THE PROCEDURE   You may have a blood test. This finds out what type of blood you have. It also finds out what kind of blood your body will accept.    If you are going to have a planned surgery, you may donate your own blood. This is done in case you need to have a transfusion.    If you have had an allergic transfusion reaction before, you may be given medicine to help prevent a reaction. Take this medicine only as told by your doctor.   You will have your temperature, blood pressure, and pulse checked.  PROCEDURE    An IV will be started in your hand or arm.    The bag of donated blood will be attached to your IV and run into your vein.    A doctor will regularly check your temperature, blood pressure, and pulse during the procedure. This is done to find any early signs of a transfusion reaction.   If you have any signs or symptoms of a reaction, the procedure may be stopped and you may be given medicine.    When the transfusion is over, your IV will be removed.    Pressure may be applied to the IV site for a few minutes.    A bandage (dressing) will be applied.   The procedure may vary among doctors and hospitals.   AFTER THE PROCEDURE   Your blood pressure, temperature, and pulse will be checked regularly.     This information is not intended to replace advice given to you by your health care provider. Make sure you discuss any questions  you have with your health care provider.     Document Released: 04/10/2008 Document Revised: 02/02/2014 Document Reviewed: 11/22/2013  Elsevier Interactive Patient Education 2016 [employer]

## 2015-01-17 LAB — TYPE AND SCREEN
ABO/RH(D): A POS
ANTIBODY SCREEN: NEGATIVE
UNIT DIVISION: 0
UNIT DIVISION: 0

## 2015-01-17 NOTE — Telephone Encounter (Signed)
lmtcb X3 for Dr. Hazeline Junker office.

## 2015-01-17 NOTE — Telephone Encounter (Signed)
Nikki called- they did get his labs.  No need to call back

## 2015-01-18 ENCOUNTER — Other Ambulatory Visit: Payer: Self-pay

## 2015-01-18 ENCOUNTER — Inpatient Hospital Stay (HOSPITAL_COMMUNITY)
Admission: EM | Admit: 2015-01-18 | Discharge: 2015-01-22 | DRG: 871 | Disposition: A | Discharge status: Home Health | Payer: Medicare Other | Attending: Internal Medicine | Admitting: Internal Medicine

## 2015-01-18 ENCOUNTER — Encounter (HOSPITAL_COMMUNITY): Payer: Self-pay | Admitting: Emergency Medicine

## 2015-01-18 ENCOUNTER — Emergency Department (HOSPITAL_COMMUNITY): Payer: Medicare Other

## 2015-01-18 DIAGNOSIS — Z800 Family history of malignant neoplasm of digestive organs: Secondary | ICD-10-CM | POA: Diagnosis not present

## 2015-01-18 DIAGNOSIS — D61818 Other pancytopenia: Secondary | ICD-10-CM | POA: Diagnosis present

## 2015-01-18 DIAGNOSIS — A419 Sepsis, unspecified organism: Principal | ICD-10-CM

## 2015-01-18 DIAGNOSIS — J189 Pneumonia, unspecified organism: Secondary | ICD-10-CM

## 2015-01-18 DIAGNOSIS — E119 Type 2 diabetes mellitus without complications: Secondary | ICD-10-CM | POA: Diagnosis present

## 2015-01-18 DIAGNOSIS — E039 Hypothyroidism, unspecified: Secondary | ICD-10-CM | POA: Diagnosis present

## 2015-01-18 DIAGNOSIS — D469 Myelodysplastic syndrome, unspecified: Secondary | ICD-10-CM | POA: Diagnosis present

## 2015-01-18 DIAGNOSIS — J181 Lobar pneumonia, unspecified organism: Secondary | ICD-10-CM | POA: Diagnosis present

## 2015-01-18 DIAGNOSIS — I10 Essential (primary) hypertension: Secondary | ICD-10-CM | POA: Diagnosis present

## 2015-01-18 DIAGNOSIS — D696 Thrombocytopenia, unspecified: Secondary | ICD-10-CM

## 2015-01-18 DIAGNOSIS — K219 Gastro-esophageal reflux disease without esophagitis: Secondary | ICD-10-CM | POA: Diagnosis present

## 2015-01-18 DIAGNOSIS — Y95 Nosocomial condition: Secondary | ICD-10-CM | POA: Diagnosis present

## 2015-01-18 DIAGNOSIS — Z950 Presence of cardiac pacemaker: Secondary | ICD-10-CM | POA: Diagnosis not present

## 2015-01-18 DIAGNOSIS — E872 Acidosis, unspecified: Secondary | ICD-10-CM | POA: Diagnosis present

## 2015-01-18 DIAGNOSIS — D638 Anemia in other chronic diseases classified elsewhere: Secondary | ICD-10-CM

## 2015-01-18 DIAGNOSIS — I5032 Chronic diastolic (congestive) heart failure: Secondary | ICD-10-CM

## 2015-01-18 DIAGNOSIS — Z833 Family history of diabetes mellitus: Secondary | ICD-10-CM

## 2015-01-18 DIAGNOSIS — D709 Neutropenia, unspecified: Secondary | ICD-10-CM

## 2015-01-18 LAB — COMPREHENSIVE METABOLIC PANEL
ALK PHOS: 49 U/L (ref 38–126)
ALT: 32 U/L (ref 17–63)
ANION GAP: 12 (ref 5–15)
AST: 27 U/L (ref 15–41)
Albumin: 3.2 g/dL — ABNORMAL LOW (ref 3.5–5.0)
BILIRUBIN TOTAL: 1 mg/dL (ref 0.3–1.2)
BUN: 24 mg/dL — ABNORMAL HIGH (ref 6–20)
CALCIUM: 9.5 mg/dL (ref 8.9–10.3)
CO2: 25 mmol/L (ref 22–32)
CREATININE: 0.92 mg/dL (ref 0.61–1.24)
Chloride: 100 mmol/L — ABNORMAL LOW (ref 101–111)
GFR calc non Af Amer: >60 mL/min (ref >60)
GLUCOSE: 308 mg/dL — AB (ref 65–99)
Potassium: 4.4 mmol/L (ref 3.5–5.1)
SODIUM: 137 mmol/L (ref 135–145)
TOTAL PROTEIN: 6.6 g/dL (ref 6.5–8.1)

## 2015-01-18 LAB — CBC WITH DIFFERENTIAL/PLATELET
BASOS ABS: 0 10*3/uL (ref 0.0–0.1)
Basophils Relative: 0 %
EOS ABS: 0 10*3/uL (ref 0.0–0.7)
Eosinophils Relative: 1 %
HCT: 23.4 % — ABNORMAL LOW (ref 39.0–52.0)
Hemoglobin: 7.8 g/dL — ABNORMAL LOW (ref 13.0–17.0)
LYMPHS ABS: 0.3 10*3/uL — AB (ref 0.7–4.0)
Lymphocytes Relative: 35 %
MCH: 34.2 pg — AB (ref 26.0–34.0)
MCHC: 33.3 g/dL (ref 30.0–36.0)
MCV: 102.6 fL — AB (ref 78.0–100.0)
MONO ABS: 0 10*3/uL — AB (ref 0.1–1.0)
Monocytes Relative: 1 %
NEUTROS ABS: 0.6 10*3/uL — AB (ref 1.7–7.7)
NEUTROS PCT: 63 %
PLATELETS: 63 10*3/uL — AB (ref 150–400)
RBC: 2.28 MIL/uL — AB (ref 4.22–5.81)
RDW: 22.9 % — AB (ref 11.5–15.5)
WBC: 0.9 10*3/uL — CL (ref 4.0–10.5)

## 2015-01-18 LAB — URINALYSIS, ROUTINE W REFLEX MICROSCOPIC
BILIRUBIN URINE: NEGATIVE
Bilirubin Urine: NEGATIVE
GLUCOSE, UA: NEGATIVE mg/dL
Glucose, UA: NEGATIVE mg/dL
HGB URINE DIPSTICK: NEGATIVE
Hgb urine dipstick: NEGATIVE
Ketones, ur: NEGATIVE mg/dL
Ketones, ur: NEGATIVE mg/dL
LEUKOCYTES UA: NEGATIVE
Leukocytes, UA: NEGATIVE
NITRITE: NEGATIVE
Nitrite: NEGATIVE
PROTEIN: NEGATIVE mg/dL
PROTEIN: NEGATIVE mg/dL
Specific Gravity, Urine: 1.023 (ref 1.005–1.030)
Specific Gravity, Urine: 1.025 (ref 1.005–1.030)
pH: 5.5 (ref 5.0–8.0)
pH: 5.5 (ref 5.0–8.0)

## 2015-01-18 LAB — PROCALCITONIN: Procalcitonin: 0.13 ng/mL

## 2015-01-18 LAB — LACTIC ACID, PLASMA: LACTIC ACID, VENOUS: 2.6 mmol/L — AB (ref 0.5–2.0)

## 2015-01-18 LAB — I-STAT TROPONIN, ED: Troponin i, poc: 0.02 ng/mL (ref 0.00–0.08)

## 2015-01-18 LAB — I-STAT CG4 LACTIC ACID, ED: Lactic Acid, Venous: 3.27 mmol/L (ref 0.5–2.0)

## 2015-01-18 LAB — MRSA PCR SCREENING: MRSA BY PCR: POSITIVE — AB

## 2015-01-18 LAB — BRAIN NATRIURETIC PEPTIDE: B Natriuretic Peptide: 668.7 pg/mL — ABNORMAL HIGH (ref 0.0–100.0)

## 2015-01-18 MED ORDER — IPRATROPIUM-ALBUTEROL 0.5-2.5 (3) MG/3ML IN SOLN
3.0000 mL | Freq: Once | RESPIRATORY_TRACT | Status: AC
  Administered 2015-01-18: 3 mL via RESPIRATORY_TRACT
  Filled 2015-01-18: qty 3

## 2015-01-18 MED ORDER — FINASTERIDE 5 MG PO TABS
5.0000 mg | ORAL_TABLET | Freq: Every morning | ORAL | Status: DC
  Administered 2015-01-18 – 2015-01-22 (×5): 5 mg via ORAL
  Filled 2015-01-18 (×5): qty 1

## 2015-01-18 MED ORDER — TBO-FILGRASTIM 480 MCG/0.8ML ~~LOC~~ SOSY
480.0000 ug | PREFILLED_SYRINGE | Freq: Once | SUBCUTANEOUS | Status: AC
  Administered 2015-01-18: 480 ug via SUBCUTANEOUS
  Filled 2015-01-18 (×2): qty 0.8

## 2015-01-18 MED ORDER — PIPERACILLIN-TAZOBACTAM 3.375 G IVPB: 3.3750 g | Freq: Three times a day (TID) | INTRAVENOUS | Status: DC

## 2015-01-18 MED ORDER — SODIUM CHLORIDE 0.9 % IV BOLUS (SEPSIS): 1000.0000 mL | Freq: Once | INTRAVENOUS | Status: DC

## 2015-01-18 MED ORDER — FLUTICASONE PROPIONATE 50 MCG/ACT NA SUSP
1.0000 | Freq: Every day | NASAL | Status: DC
  Administered 2015-01-18 – 2015-01-20 (×3): 1 via NASAL
  Filled 2015-01-18 (×2): qty 16

## 2015-01-18 MED ORDER — SODIUM CHLORIDE 0.9 % IV BOLUS (SEPSIS): 500.0000 mL | Freq: Once | INTRAVENOUS | Status: DC

## 2015-01-18 MED ORDER — FAMOTIDINE 20 MG PO TABS
20.0000 mg | ORAL_TABLET | Freq: Every day | ORAL | Status: DC
  Administered 2015-01-18 – 2015-01-21 (×4): 20 mg via ORAL
  Filled 2015-01-18 (×4): qty 1

## 2015-01-18 MED ORDER — SODIUM CHLORIDE 0.9 % IV SOLN: Freq: Once | INTRAVENOUS | Status: DC

## 2015-01-18 MED ORDER — TRAMADOL HCL 50 MG PO TABS: 50.0000 mg | ORAL_TABLET | Freq: Four times a day (QID) | ORAL | Status: DC | PRN

## 2015-01-18 MED ORDER — SODIUM CHLORIDE 0.9 % IV SOLN: INTRAVENOUS | Status: AC

## 2015-01-18 MED ORDER — DOCUSATE SODIUM 100 MG PO CAPS
100.0000 mg | ORAL_CAPSULE | Freq: Every day | ORAL | Status: DC | PRN
  Administered 2015-01-19: 100 mg via ORAL
  Filled 2015-01-18: qty 1

## 2015-01-18 MED ORDER — LEVOTHYROXINE SODIUM 25 MCG PO TABS
25.0000 ug | ORAL_TABLET | Freq: Every day | ORAL | Status: DC
  Administered 2015-01-19 – 2015-01-22 (×4): 25 ug via ORAL
  Filled 2015-01-18 (×4): qty 1

## 2015-01-18 MED ORDER — CHLORHEXIDINE GLUCONATE CLOTH 2 % EX PADS
6.0000 | MEDICATED_PAD | Freq: Every day | CUTANEOUS | Status: DC
  Administered 2015-01-20 – 2015-01-22 (×3): 6 via TOPICAL

## 2015-01-18 MED ORDER — MUPIROCIN 2 % EX OINT
TOPICAL_OINTMENT | Freq: Two times a day (BID) | CUTANEOUS | Status: DC
Start: 2015-01-18 — End: 2015-01-22
  Administered 2015-01-18: 1 via NASAL
  Administered 2015-01-19 – 2015-01-22 (×7): via NASAL
  Filled 2015-01-18 (×2): qty 22

## 2015-01-18 MED ORDER — CEFEPIME HCL 1 G IJ SOLR
1.0000 g | Freq: Three times a day (TID) | INTRAMUSCULAR | Status: DC
  Filled 2015-01-18: qty 1

## 2015-01-18 MED ORDER — ACETAMINOPHEN 500 MG PO TABS
500.0000 mg | ORAL_TABLET | Freq: Four times a day (QID) | ORAL | Status: DC | PRN
  Administered 2015-01-22: 500 mg via ORAL
  Filled 2015-01-18: qty 1

## 2015-01-18 MED ORDER — VANCOMYCIN HCL 10 G IV SOLR
2000.0000 mg | Freq: Once | INTRAVENOUS | Status: DC
  Administered 2015-01-18: 2000 mg via INTRAVENOUS
  Filled 2015-01-18: qty 2000

## 2015-01-18 MED ORDER — VANCOMYCIN HCL IN DEXTROSE 750-5 MG/150ML-% IV SOLN
750.0000 mg | Freq: Two times a day (BID) | INTRAVENOUS | Status: DC
  Administered 2015-01-19 – 2015-01-20 (×3): 750 mg via INTRAVENOUS
  Filled 2015-01-18 (×6): qty 150

## 2015-01-18 MED ORDER — CALCIUM CARBONATE-VITAMIN D 500-200 MG-UNIT PO TABS
1.0000 | ORAL_TABLET | Freq: Two times a day (BID) | ORAL | Status: DC
  Administered 2015-01-18 – 2015-01-22 (×8): 1 via ORAL
  Filled 2015-01-18 (×8): qty 1

## 2015-01-18 MED ORDER — VITAMIN D3 25 MCG (1000 UNIT) PO TABS
1000.0000 [IU] | ORAL_TABLET | Freq: Two times a day (BID) | ORAL | Status: DC
  Administered 2015-01-18 – 2015-01-22 (×10): 1000 [IU] via ORAL
  Filled 2015-01-18 (×17): qty 1

## 2015-01-18 MED ORDER — SODIUM CHLORIDE 0.9 % IV BOLUS (SEPSIS)
1000.0000 mL | Freq: Once | INTRAVENOUS | Status: AC
  Administered 2015-01-18: 1000 mL via INTRAVENOUS

## 2015-01-18 MED ORDER — METFORMIN HCL 500 MG PO TABS
500.0000 mg | ORAL_TABLET | Freq: Two times a day (BID) | ORAL | Status: DC
  Administered 2015-01-18 – 2015-01-22 (×8): 500 mg via ORAL
  Filled 2015-01-18 (×8): qty 1

## 2015-01-18 MED ORDER — PIPERACILLIN-TAZOBACTAM 3.375 G IVPB 30 MIN
3.3750 g | Freq: Once | INTRAVENOUS | Status: AC
  Administered 2015-01-18: 3.375 g via INTRAVENOUS
  Filled 2015-01-18: qty 50

## 2015-01-18 MED ORDER — OXYBUTYNIN CHLORIDE ER 10 MG PO TB24
10.0000 mg | ORAL_TABLET | ORAL | Status: DC
  Administered 2015-01-19 – 2015-01-22 (×4): 10 mg via ORAL
  Filled 2015-01-18 (×6): qty 1

## 2015-01-18 MED ORDER — DEXTROSE 5 % IV SOLN
2.0000 g | Freq: Three times a day (TID) | INTRAVENOUS | Status: DC
  Administered 2015-01-18 – 2015-01-22 (×12): 2 g via INTRAVENOUS
  Filled 2015-01-18 (×13): qty 2

## 2015-01-18 MED ORDER — ONE-DAILY MULTI VITAMINS PO TABS
1.0000 | ORAL_TABLET | Freq: Every day | ORAL | Status: DC
  Administered 2015-01-19 – 2015-01-22 (×4): 1 via ORAL
  Filled 2015-01-18 (×4): qty 1

## 2015-01-18 MED ORDER — GUAIFENESIN ER 600 MG PO TB12
600.0000 mg | ORAL_TABLET | Freq: Two times a day (BID) | ORAL | Status: DC | PRN
Start: 2015-01-18 — End: 2015-01-19
  Administered 2015-01-19: 600 mg via ORAL
  Filled 2015-01-18: qty 1

## 2015-01-18 MED ORDER — PANTOPRAZOLE SODIUM 40 MG PO TBEC
40.0000 mg | DELAYED_RELEASE_TABLET | Freq: Every day | ORAL | Status: DC
  Administered 2015-01-18 – 2015-01-22 (×5): 40 mg via ORAL
  Filled 2015-01-18 (×5): qty 1

## 2015-01-18 MED ORDER — TRAZODONE HCL 50 MG PO TABS
100.0000 mg | ORAL_TABLET | Freq: Every day | ORAL | Status: DC
  Administered 2015-01-18 – 2015-01-21 (×4): 100 mg via ORAL
  Filled 2015-01-18 (×4): qty 2

## 2015-01-18 NOTE — H&P (Signed)
[facility] History and Physical  [name] RWE:[id number] DOB: 1932/04/05 DOA: 01/18/2015  Referring physician: ER physician: Dr. Lonia Skinner PCP: Christinia Gully, MD  Chief Complaint: "not feeling well"  HPI:  81 year old male with past medical history of follicular lymphoma,  s/p systemic therapy with CHOP and rituximab, had complete response to therapy concluded in 2007, status post bone marrow biopsy in April 2011, did not really show any evidence of myelodysplasia or metastatic lymphoma at that time, had last visit in 11/2014 with Dr. [name], getting biweekly aranesp and intermittent PRBC transfusion for low hemoglobin. He was just seen in ED 01/14/15 status post fall and x rays at that time showed no fractures so he went back home. He has not felt well since than and per his family was not really himself. He had some cough but no fevers. No chest pain. No abdominal pain, no nausea or vomiting. No diarrhea or constipation. No blood in stool or urine. In ED, BP was 88/34 but has trended up to 116/61 with IV fluids given in ED. Blood work showed WBC count of 0.9, hemoglobin was 7.8, platelets 63, normal Cr. Lactic acid was 3.27. CT head showed no acute intracranial findings. CXR showed right upper lung lobe opacity. Sepsis criteria met on admission so pt started on vanco and zosyn and admitted to SDU.   Assessment & Plan    Principal Problem:   Sepsis due to pneumonia ([place]) / Lobar pneumonia, unspecified organism ([place]) /  Lactic acidosis - Sepsis criteria met with hypotension, neutropenia, lactic acidosis - Source of infection is likely pneumonia. CXR on admission showed RUL opacity - Sepsis work up initiated - Pneumonia order set placed - Follow up blood cultures, procalcitonin level, strep pneumonia, legionella results - Empiric abx started, vanco and zosyn  - Admission to SDU for the first 24 hours   Active Problems:   MDS (myelodysplastic syndrome) ([place]) / Follicular  lymphoma  - Follows with Dr. [name] - Status post bone marrow biopsy on 06/22/2012.     Neutropenia ([place]) - Due to MDS - Start Neupogen - Check CBC daily     Thrombocytopenia ([facility]) - Likely due to history of MDS - Platelets 63      Anemia of chronic disease - Due to history of MDS - Hemoglobin 7.8 on admission - Continue to monitor CBC daily  - He gets aranesp inj every 2 weeks    Pacemaker - Stable   DVT prophylaxis:  - SCD's due to thrombocytopenia   Radiological Exams on Admission: Ct Head Wo Contrast 01/18/2015  No acute intracranial abnormality and stable noncontrast CT appearance of the brain. Chronic dystrophic calcification in the posterior right thalamus and midbrain. Electronically Signed   By: Genevie Ann M.D.   On: 01/18/2015 10:50   Dg Abd Acute W/chest 01/18/2015 1. New vague right upper lobe/apical pulmonary opacity since 01/13/2015 with suspected new small right pleural effusion. Query pneumonia. 2.  Normal bowel gas pattern, no free air. Electronically Signed   By: Genevie Ann M.D.   On: 01/18/2015 11:02    Code Status: Full Family Communication: Plan of care discussed with the patient  Disposition Plan: Admit for further evaluation, SDU admission because of sepsis   Leisa Lenz, MD  [facility] Pager [phone]  Time spent in minutes: 75 minutes  Review of Systems:  Constitutional: Negative for fever, chills and positive for malaise/fatigue. Negative for diaphoresis.  HENT: Negative for hearing loss, ear pain, nosebleeds, congestion,  sore throat, neck pain, tinnitus and ear discharge.   Eyes: Negative for blurred vision, double vision, photophobia, pain, discharge and redness.  Respiratory: Negative for cough, hemoptysis, sputum production, shortness of breath, wheezing and stridor.   Cardiovascular: Negative for chest pain, palpitations, orthopnea, claudication and leg swelling.  Gastrointestinal: Negative for nausea, vomiting and abdominal pain.  Negative for heartburn, constipation, blood in stool and melena.  Genitourinary: Negative for dysuria, urgency, frequency, hematuria and flank pain.  Musculoskeletal: Negative for myalgias, back pain, joint pain and falls.  Skin: Negative for itching and rash.  Neurological: Negative for dizziness and weakness. Negative for tingling, tremors, sensory change, speech change, focal weakness, loss of consciousness and headaches.  Endo/Heme/Allergies: Negative for environmental allergies and polydipsia. Does not bruise/bleed easily.  Psychiatric/Behavioral: Negative for suicidal ideas. The patient is not nervous/anxious.      Past Medical History  Diagnosis Date  . Second degree Mobitz II AV block 12/11/08    with transient syncope, resolved s/p PPM  . Paroxysmal atrial fibrillation ([place]) 03/16/11    diagnosed by PPM interrogation   Past Surgical History  Procedure Laterality Date  . Lymph node biopsy  04/2005    groin  . Sternotomy  2000  . Pericardiectomy  2000  . Penile prosthesis implant      and removal  . Pacemaker insertion  12/11/08    MDT implanted by Dr Rayann Heman  . Tee without cardioversion N/A 10/31/2012    Procedure: TRANSESOPHAGEAL ECHOCARDIOGRAM (TEE);  Surgeon: Lelon Perla, MD;  Location: [facility] ENDOSCOPY;  Service: Cardiovascular;  Laterality: N/A;  . Pacemaker lead removal Left 11/04/2012    Procedure: PACEMAKER LEAD REMOVAL;  Surgeon: Evans Lance, MD;  Location: [place];  Service: Cardiovascular;  Laterality: Left;  . Temporary pacemaker insertion Left 11/04/2012    Procedure: TEMPORARY PACEMAKER INSERTION;  Surgeon: Evans Lance, MD;  Location: [place];  Service: Cardiovascular;  Laterality: Left;  . Insert / replace / remove pacemaker  12/29/2012  . Bi-ventricular pacemaker upgrade N/A 12/29/2012    Procedure: BI-VENTRICULAR PACEMAKER UPGRADE;  Surgeon: Evans Lance, MD;  Location[facility] CATH LAB;  Service: Cardiovascular;  Laterality: N/A;   Social History:  reports that  he quit smoking about 47 years ago. His smoking use included Cigarettes. He has a 30 pack-year smoking history. He has quit using smokeless tobacco. His smokeless tobacco use included Chew. He reports that he does not drink alcohol or use illicit drugs.  No Known Allergies  Family History:  Family History  Problem Relation Age of Onset  . Diabetes Mother   . Liver cancer Brother   . Cancer Sister      Prior to Admission medications   Medication Sig Start Date End Date Taking? Authorizing Provider  acetaminophen (TYLENOL) 500 MG tablet Take 500 mg by mouth every 6 (six) hours as needed for mild pain or headache.   Yes Historical Provider, MD  albuterol (PROVENTIL HFA;VENTOLIN HFA) 108 (90 BASE) MCG/ACT inhaler Inhale 1-2 puffs into the lungs every 4 (four) hours as needed for wheezing or shortness of breath. Patient taking differently: Inhale 2 puffs into the lungs every 4 (four) hours as needed for wheezing or shortness of breath.  02/20/14  Yes Tammy S Parrett, NP  aspirin 81 MG tablet Take 81 mg by mouth every morning. (hold if bleeding)   Yes Historical Provider, MD  calcium-vitamin D (OS-CAL 500 + D) 500-200 MG-UNIT per tablet Take 1 tablet by mouth 2 (two) times daily.  Yes Historical Provider, MD  Cholecalciferol (VITAMIN D) 1000 UNITS capsule Take 1,000 Units by mouth 2 (two) times daily.     Yes Historical Provider, MD  docusate sodium (COLACE) 100 MG capsule Take 100 mg by mouth daily as needed for moderate constipation.    Yes Historical Provider, MD  DULERA 200-5 MCG/ACT AERO INHALE 2 PUFFS FIRST THING EVERY MORNING AND THEN ANOTHER 2 PUFFS ABOUT 12 HOURS LATER 09/28/14  Yes Tanda Rockers, MD  famotidine (PEPCID) 20 MG tablet Take 20 mg by mouth at bedtime.    Yes Historical Provider, MD  finasteride (PROSCAR) 5 MG tablet Take 5 mg by mouth every morning.    Yes Historical Provider, MD  furosemide (LASIX) 40 MG tablet TAKE 1 TABLET BY MOUTH DAILY. MAY TAKE 1 EXTRA TABLET DAILY IF  NEEDED FOR SWELLING IN LEGS 10/26/14  Yes Tanda Rockers, MD  KLOR-CON M20 20 MEQ tablet TAKE 1 TABLET BY MOUTH EVERY DAY 01/09/15  Yes Tanda Rockers, MD  levothyroxine (SYNTHROID, LEVOTHROID) 25 MCG tablet TAKE 1 TABLET BY MOUTH EVERY MORNING BEFORE BREAKFAST 09/13/14  Yes Tanda Rockers, MD  lidocaine-prilocaine (EMLA) cream Apply 1 application topically as needed. Patient taking differently: Apply 1 application topically as needed (port access).  12/05/14  Yes Wyatt Portela, MD  metFORMIN (GLUCOPHAGE) 500 MG tablet TAKE 1 TABLET BY MOUTH TWICE A DAY WITH FOOD Patient taking differently: TAKE 1/2 TABLET BY MOUTH TWICE A DAY AFTER MEALS 02/23/14  Yes Tanda Rockers, MD  Multiple Vitamin (MULTIVITAMIN) tablet Take 1 tablet by mouth daily.     Yes Historical Provider, MD  NASONEX 50 MCG/ACT nasal spray USE 2 SPRAYS NASALLY TWICE DAILY 10/18/14  Yes Tanda Rockers, MD  nitroGLYCERIN (NITROSTAT) 0.4 MG SL tablet Place 1 tablet (0.4 mg total) under the tongue every 5 (five) minutes as needed for chest pain. 06/04/14  Yes Thompson Grayer, MD  omeprazole (PRILOSEC) 20 MG capsule Take 40 mg by mouth daily before breakfast.    Yes Historical Provider, MD  oxybutynin (DITROPAN-XL) 10 MG 24 hr tablet Take 10 mg by mouth every morning.    Yes Historical Provider, MD  sulindac (CLINORIL) 200 MG tablet TAKE 1 TABLET BY MOUTH TWICE A DAY Patient taking differently: TAKE 1 TABLET BY MOUTH TWICE A DAY as needed for pain 08/22/14  Yes Tanda Rockers, MD  traMADol Veatrice Bourbon) 50 MG tablet Take 1/2 to one  tablet by mouth every 4 hours as needed for severe pain or cough 07/18/14  Yes Tanda Rockers, MD  traZODone (DESYREL) 150 MG tablet 2/3 to 1 tablet at bedtime Patient taking differently: Take 100 mg by mouth at bedtime.  01/02/15  Yes Tanda Rockers, MD  KLOR-CON M20 20 MEQ tablet TAKE 1 TABLET BY MOUTH EVERY DAY Patient not taking: Reported on 01/18/2015 01/16/15   Tanda Rockers, MD   Physical Exam: Filed Vitals:    01/18/15 0916 01/18/15 0921 01/18/15 1053  BP: 116/61    Pulse: 64    Temp:  98 F (36.7 C)   TempSrc: Oral Oral   Resp: 18    Height:  5' 11"  (1.803 m)   Weight:  108.863 kg (240 lb)   SpO2: 97%  95%    Physical Exam  Constitutional: Appears well-developed and well-nourished. No distress.  HENT: Normocephalic. No tonsillar erythema or exudates Eyes: Conjunctivae are normal. No scleral icterus.  Neck: Normal ROM. Neck supple. No JVD. No tracheal deviation. No  thyromegaly.  CVS: RRR, S1/S2 +, no murmurs, no gallops, no carotid bruit.  Pulmonary: Effort and breath sounds normal, no stridor, rhonchi, wheezes, rales.  Abdominal: Soft. BS +,  no distension, tenderness, rebound or guarding.  Musculoskeletal: Normal range of motion. (+) edema and no tenderness.  Lymphadenopathy: No lymphadenopathy noted, cervical, inguinal. Neuro: Alert. Normal reflexes, muscle tone coordination. No focal neurologic deficits. Skin: Skin is warm and dry. No rash noted.  No erythema. No pallor.  Psychiatric: Normal mood and affect. Behavior, judgment, thought content normal.   Labs on Admission:  Basic Metabolic Panel:  Recent Labs Lab 01/13/15 0515 01/18/15 0940  NA 138 137  K 3.5 4.4  CL 100* 100*  CO2 26 25  GLUCOSE 183* 308*  BUN 17 24*  CREATININE 0.97 0.92  CALCIUM 9.8 9.5   Liver Function Tests:  Recent Labs Lab 01/18/15 0940  AST 27  ALT 32  ALKPHOS 49  BILITOT 1.0  PROT 6.6  ALBUMIN 3.2*   No results for input(s): LIPASE, AMYLASE in the last 168 hours. No results for input(s): AMMONIA in the last 168 hours. CBC:  Recent Labs Lab 01/13/15 0515 01/16/15 0917 01/18/15 0940  WBC 0.9* 0.9* 0.9*  NEUTROABS 0.6* 0.5* 0.6*  HGB 8.7* 6.8* 7.8*  HCT 25.2* 20.0* 23.4*  MCV 103.3* 102.6* 102.6*  PLT 65* 65* 63*   Cardiac Enzymes: No results for input(s): CKTOTAL, CKMB, CKMBINDEX, TROPONINI in the last 168 hours. BNP: Invalid input(s): POCBNP CBG:  Recent Labs Lab  01/13/15 0332  GLUCAP 224*    If 7PM-7AM, please contact night-coverage [email or web address] Password [email or web address] 01/18/2015, 12:01 PM

## 2015-01-18 NOTE — Progress Notes (Signed)
Alert x4 TX from ICU belongings with Pt, no family present, no c/o pain, placed on contact precautions. Will continue to monitor. Jeanie Sewer, RN 11:09 PM 01/18/2015

## 2015-01-18 NOTE — ED Notes (Signed)
Patient is aware we need urine. Urinal at bedside. 

## 2015-01-18 NOTE — ED Notes (Signed)
Bed: WA03 Expected date:  Expected time:  Means of arrival:  Comments: EMS-headache

## 2015-01-18 NOTE — ED Notes (Signed)
Pt reports that he started to feel bad last pm with non-productive cough and body aches. Pt is A&O, follows commands and in NAD

## 2015-01-18 NOTE — ED Notes (Signed)
CHARGE SUSAN RN PRESENT AT [facility] ACCESSING PORT

## 2015-01-18 NOTE — ED Notes (Signed)
Pt comes to Ed with flu like symptoms, nausea, constipation and sore thoat.No reported fever or chills, but reports sweatiness and some SOB with walking. Pt alert x 4. Pt has a vast medical history ( see notes) Triage in room, family in room.

## 2015-01-18 NOTE — Progress Notes (Signed)
Pharmacy Brief Note:  Antibiotic Renal Dose Adjustment - Cefepime  D#1 vancomycin (see pharmacy note from earlier today) and cefepime for pneumonia and sepsis.  Current cefepime dosage 1g q8h. Pt weight 108.9K SCr 0.92, CrCl ~ 80 mL/min ANC 0.6 K  Assessment/Plan: Given presence of neutropenia, along with patient's weight and renal function, will increase cefepime to 2 g q8h.  Clayburn Pert, PharmD, BCPS Pager: [phone] 01/18/2015  1:47 PM

## 2015-01-18 NOTE — Progress Notes (Signed)
ANTIBIOTIC CONSULT NOTE - INITIAL  Pharmacy Consult for vancomycin/Zosyn Indication: PNA/sepsis  No Known Allergies  Patient Measurements: Height: 5\' 11"  (180.3 cm) Weight: 240 lb (108.863 kg) IBW/kg (Calculated) : 75.3 Adjusted Body Weight:   Vital Signs: Temp: 98 F (36.7 C) (12/23 0921) Temp Source: Oral (12/23 0921) BP: 105/62 mmHg (12/23 1230) Pulse Rate: 58 (12/23 1230) Intake/Output from previous day:   Intake/Output from this shift:    Labs:  Recent Labs  01/16/15 0917 01/18/15 0940  WBC 0.9* 0.9*  HGB 6.8* 7.8*  PLT 65* 63*  CREATININE  --  0.92   Estimated Creatinine Clearance: 80.3 mL/min (by C-G formula based on Cr of 0.92). No results for input(s): VANCOTROUGH, VANCOPEAK, VANCORANDOM, GENTTROUGH, GENTPEAK, GENTRANDOM, TOBRATROUGH, TOBRAPEAK, TOBRARND, AMIKACINPEAK, AMIKACINTROU, AMIKACIN in the last 72 hours.   Microbiology: No results found for this or any previous visit (from the past 720 hour(s)).  Medical History: Past Medical History  Diagnosis Date  . Second degree Mobitz II AV block 12/11/08    with transient syncope, resolved s/p PPM  . Paroxysmal atrial fibrillation ([place]) 03/16/11    diagnosed by PPM interrogation    Medications:  Scheduled:  . Tbo-Filgrastim  480 mcg Subcutaneous Once  . [START ON 01/19/2015] vancomycin  750 mg Intravenous Q12H   Assessment: Pt is a 81 yo male with suspected sepsis/PNA. PMH includes follicular lymphoma in remission, MDS with pancytopenia. Pt has been ordered filgastrim 480 mcg x 1. Code sepsis called.    12/23 >> vancomycin >> 12/23 >> Zosyn >>    12/23 blood x2: 12/23 urine: 12/23 Legionella antigen 12/23 Strep pneumo antigen     Goal of Therapy:  Vancomycin trough level 15-20 mcg/ml  Follow up culture results   Plan:  Follow up culture results  Zosyn 3.375 gr IV q8h EI Vancomycin 2000 mg IV x1, then vancomycin 750 mg IV q12h.   Royetta Asal, PharmD, BCPS Pager  [phone] 01/18/2015 1:09 PM

## 2015-01-18 NOTE — ED Notes (Signed)
Pt in xr will draw other labs when pt returns

## 2015-01-18 NOTE — ED Notes (Signed)
Note pt was here on  ED Sunday 01-13-15, from a fall in the bathroom.

## 2015-01-18 NOTE — ED Provider Notes (Signed)
CSN: [id number]     Arrival date & time 01/18/15  [id number] History   First MD Initiated Contact with Patient 01/18/15 0911     Chief Complaint  Patient presents with  . Nausea  . Headache  . Constipation     (Consider location/radiation/quality/duration/timing/severity/associated sxs/prior Treatment) HPI Comments: 81 year old male with history of MDS currently in remission and not on treatment, CHF, status post pacemaker placement, diabetes mellitus presents with his daughter for just not feeling well. Patient and daughter report that the patient was seen here approximately 5 days ago after a fall. Ever since that time they say that he is just not been himself. He seemed more confused and more tired than usual. The daughter has also noted that he seems to be congested and has had a mild cough. His urine has been dark and foul-smelling. She also noted that he's been wheezing. He denies chest pain. He doesn't or shortness of breath. He says he just can't explain it but he doesn't feel himself.  Patient is a 81 y.o. male presenting with headaches and constipation.  Headache Associated symptoms: congestion, cough, fatigue and myalgias   Associated symptoms: no abdominal pain, no back pain, no drainage, no fever, no nausea and no vomiting   Constipation Associated symptoms: no abdominal pain, no back pain, no dysuria, no fever, no nausea and no vomiting     Past Medical History  Diagnosis Date  . Second degree Mobitz II AV block 12/11/08    with transient syncope, resolved s/p PPM  . Paroxysmal atrial fibrillation ([place]) 03/16/11    diagnosed by PPM interrogation   Past Surgical History  Procedure Laterality Date  . Lymph node biopsy  04/2005    groin  . Sternotomy  2000  . Pericardiectomy  2000  . Penile prosthesis implant      and removal  . Pacemaker insertion  12/11/08    MDT implanted by Dr Rayann Heman  . Tee without cardioversion N/A 10/31/2012    Procedure: TRANSESOPHAGEAL  ECHOCARDIOGRAM (TEE);  Surgeon: Lelon Perla, MD;  Location: [facility] ENDOSCOPY;  Service: Cardiovascular;  Laterality: N/A;  . Pacemaker lead removal Left 11/04/2012    Procedure: PACEMAKER LEAD REMOVAL;  Surgeon: Evans Lance, MD;  Location: [place];  Service: Cardiovascular;  Laterality: Left;  . Temporary pacemaker insertion Left 11/04/2012    Procedure: TEMPORARY PACEMAKER INSERTION;  Surgeon: Evans Lance, MD;  Location: [place];  Service: Cardiovascular;  Laterality: Left;  . Insert / replace / remove pacemaker  12/29/2012  . Bi-ventricular pacemaker upgrade N/A 12/29/2012    Procedure: BI-VENTRICULAR PACEMAKER UPGRADE;  Surgeon: Evans Lance, MD;  Location: [facility] CATH LAB;  Service: Cardiovascular;  Laterality: N/A;   Family History  Problem Relation Age of Onset  . Diabetes Mother   . Liver cancer Brother   . Cancer Sister    Social History  Substance Use Topics  . Smoking status: Former Smoker -- 1.00 packs/day for 30 years    Types: Cigarettes    Quit date: 01/27/1968  . Smokeless tobacco: Former Systems developer    Types: Chew  . Alcohol Use: No    Review of Systems  Constitutional: Positive for activity change and fatigue. Negative for fever and chills.  HENT: Positive for congestion. Negative for postnasal drip.   Eyes: Negative for discharge.  Respiratory: Positive for cough, shortness of breath and wheezing. Negative for chest tightness.   Cardiovascular: Positive for leg swelling (chronic, maybe slightly increased). Negative for chest pain  and palpitations.  Gastrointestinal: Positive for constipation. Negative for nausea, vomiting, abdominal pain and blood in stool.  Genitourinary: Negative for dysuria and hematuria.       Dark, foul smelling urine  Musculoskeletal: Positive for myalgias. Negative for back pain.  Skin: Negative for rash.  Neurological: Positive for headaches.  Hematological: Does not bruise/bleed easily.  Psychiatric/Behavioral: Positive for confusion.       Allergies  Review of patient's allergies indicates no known allergies.  Home Medications   Prior to Admission medications   Medication Sig Start Date End Date Taking? Authorizing Provider  acetaminophen (TYLENOL) 500 MG tablet Take 500 mg by mouth every 6 (six) hours as needed for mild pain or headache.   Yes Historical Provider, MD  albuterol (PROVENTIL HFA;VENTOLIN HFA) 108 (90 BASE) MCG/ACT inhaler Inhale 1-2 puffs into the lungs every 4 (four) hours as needed for wheezing or shortness of breath. Patient taking differently: Inhale 2 puffs into the lungs every 4 (four) hours as needed for wheezing or shortness of breath.  02/20/14  Yes Tammy S Parrett, NP  aspirin 81 MG tablet Take 81 mg by mouth every morning. (hold if bleeding)   Yes Historical Provider, MD  calcium-vitamin D (OS-CAL 500 + D) 500-200 MG-UNIT per tablet Take 1 tablet by mouth 2 (two) times daily.    Yes Historical Provider, MD  Cholecalciferol (VITAMIN D) 1000 UNITS capsule Take 1,000 Units by mouth 2 (two) times daily.     Yes Historical Provider, MD  docusate sodium (COLACE) 100 MG capsule Take 100 mg by mouth daily as needed for moderate constipation.    Yes Historical Provider, MD  DULERA 200-5 MCG/ACT AERO INHALE 2 PUFFS FIRST THING EVERY MORNING AND THEN ANOTHER 2 PUFFS ABOUT 12 HOURS LATER 09/28/14  Yes Tanda Rockers, MD  famotidine (PEPCID) 20 MG tablet Take 20 mg by mouth at bedtime.    Yes Historical Provider, MD  finasteride (PROSCAR) 5 MG tablet Take 5 mg by mouth every morning.    Yes Historical Provider, MD  furosemide (LASIX) 40 MG tablet TAKE 1 TABLET BY MOUTH DAILY. MAY TAKE 1 EXTRA TABLET DAILY IF NEEDED FOR SWELLING IN LEGS 10/26/14  Yes Tanda Rockers, MD  KLOR-CON M20 20 MEQ tablet TAKE 1 TABLET BY MOUTH EVERY DAY 01/09/15  Yes Tanda Rockers, MD  levothyroxine (SYNTHROID, LEVOTHROID) 25 MCG tablet TAKE 1 TABLET BY MOUTH EVERY MORNING BEFORE BREAKFAST 09/13/14  Yes Tanda Rockers, MD   lidocaine-prilocaine (EMLA) cream Apply 1 application topically as needed. Patient taking differently: Apply 1 application topically as needed (port access).  12/05/14  Yes Wyatt Portela, MD  metFORMIN (GLUCOPHAGE) 500 MG tablet TAKE 1 TABLET BY MOUTH TWICE A DAY WITH FOOD Patient taking differently: TAKE 1/2 TABLET BY MOUTH TWICE A DAY AFTER MEALS 02/23/14  Yes Tanda Rockers, MD  Multiple Vitamin (MULTIVITAMIN) tablet Take 1 tablet by mouth daily.     Yes Historical Provider, MD  NASONEX 50 MCG/ACT nasal spray USE 2 SPRAYS NASALLY TWICE DAILY 10/18/14  Yes Tanda Rockers, MD  nitroGLYCERIN (NITROSTAT) 0.4 MG SL tablet Place 1 tablet (0.4 mg total) under the tongue every 5 (five) minutes as needed for chest pain. 06/04/14  Yes Thompson Grayer, MD  omeprazole (PRILOSEC) 20 MG capsule Take 40 mg by mouth daily before breakfast.    Yes Historical Provider, MD  oxybutynin (DITROPAN-XL) 10 MG 24 hr tablet Take 10 mg by mouth every morning.    Yes Historical  Provider, MD  sulindac (CLINORIL) 200 MG tablet TAKE 1 TABLET BY MOUTH TWICE A DAY Patient taking differently: TAKE 1 TABLET BY MOUTH TWICE A DAY as needed for pain 08/22/14  Yes Tanda Rockers, MD  traMADol Veatrice Bourbon) 50 MG tablet Take 1/2 to one  tablet by mouth every 4 hours as needed for severe pain or cough 07/18/14  Yes Tanda Rockers, MD  traZODone (DESYREL) 150 MG tablet 2/3 to 1 tablet at bedtime Patient taking differently: Take 100 mg by mouth at bedtime.  01/02/15  Yes Tanda Rockers, MD  KLOR-CON M20 20 MEQ tablet TAKE 1 TABLET BY MOUTH EVERY DAY Patient not taking: Reported on 01/18/2015 01/16/15   Tanda Rockers, MD   BP 151/76 mmHg  Pulse 62  Temp(Src) 97.6 F (36.4 C) (Oral)  Resp 20  Ht 5\' 11"  (1.803 m)  Wt 244 lb 14.9 oz (111.1 kg)  BMI 34.18 kg/m2  SpO2 100% Physical Exam  Constitutional: He is oriented to person, place, and time. No distress.  HENT:  Head: Normocephalic.  Right Ear: External ear normal.  Left Ear: External ear  normal.  Mouth/Throat: Oropharynx is clear and moist. No oropharyngeal exudate.  Eyes: Conjunctivae and EOM are normal. Pupils are equal, round, and reactive to light. Right eye exhibits no discharge. Left eye exhibits no discharge.  Neck: Normal range of motion. Neck supple.  Cardiovascular: Normal rate, regular rhythm and intact distal pulses.   Pulmonary/Chest: Effort normal. Tachypnea (mild) noted. He has wheezes (scattered, mild, bilateral). He has no rhonchi. He has no rales.  Abdominal: Soft. He exhibits no distension. There is no tenderness. There is no rebound and no guarding.  Musculoskeletal: He exhibits edema (bilateral, symmetric, 2+ pitting edema of the lower extremities).  Neurological: He is alert and oriented to person, place, and time. No cranial nerve deficit. He exhibits normal muscle tone.  Skin: Skin is warm and dry. No rash noted. He is not diaphoretic.  Vitals reviewed.   ED Course  Procedures (including critical care time) Labs Review Labs Reviewed  MRSA PCR SCREENING - Abnormal; Notable for the following:    MRSA by PCR POSITIVE (*)    All other components within normal limits  COMPREHENSIVE METABOLIC PANEL - Abnormal; Notable for the following:    Chloride 100 (*)    Glucose, Bld 308 (*)    BUN 24 (*)    Albumin 3.2 (*)    All other components within normal limits  CBC WITH DIFFERENTIAL/PLATELET - Abnormal; Notable for the following:    WBC 0.9 (*)    RBC 2.28 (*)    Hemoglobin 7.8 (*)    HCT 23.4 (*)    MCV 102.6 (*)    MCH 34.2 (*)    RDW 22.9 (*)    Platelets 63 (*)    Neutro Abs 0.6 (*)    Lymphs Abs 0.3 (*)    Monocytes Absolute 0.0 (*)    All other components within normal limits  BRAIN NATRIURETIC PEPTIDE - Abnormal; Notable for the following:    B Natriuretic Peptide 668.7 (*)    All other components within normal limits  LACTIC ACID, PLASMA - Abnormal; Notable for the following:    Lactic Acid, Venous 2.6 (*)    All other components  within normal limits  COMPREHENSIVE METABOLIC PANEL - Abnormal; Notable for the following:    Glucose, Bld 193 (*)    BUN 22 (*)    Total Protein 6.3 (*)  Albumin 3.0 (*)    AST 46 (*)    All other components within normal limits  CBC - Abnormal; Notable for the following:    WBC 2.2 (*)    RBC 2.27 (*)    Hemoglobin 7.9 (*)    HCT 23.1 (*)    MCV 101.8 (*)    MCH 34.8 (*)    RDW 22.6 (*)    Platelets 55 (*)    All other components within normal limits  I-STAT CG4 LACTIC ACID, ED - Abnormal; Notable for the following:    Lactic Acid, Venous 3.27 (*)    All other components within normal limits  CULTURE, BLOOD (ROUTINE X 2)  CULTURE, BLOOD (ROUTINE X 2)  URINE CULTURE  CULTURE, EXPECTORATED SPUTUM-ASSESSMENT  GRAM STAIN  URINALYSIS, ROUTINE W REFLEX MICROSCOPIC (NOT AT [facility])  PROCALCITONIN  MAGNESIUM  PHOSPHORUS  URINALYSIS, ROUTINE W REFLEX MICROSCOPIC (NOT AT [facility])  PATHOLOGIST SMEAR REVIEW  HIV ANTIBODY (ROUTINE TESTING)  STREP PNEUMONIAE URINARY ANTIGEN  LEGIONELLA ANTIGEN, URINE  I-STAT TROPOININ, ED    Imaging Review Ct Head Wo Contrast  01/18/2015  CLINICAL DATA:  81 year old male with generalized weakness, confusion, and fall 5 days ago. Initial encounter. EXAM: CT HEAD WITHOUT CONTRAST TECHNIQUE: Contiguous axial images were obtained from the base of the skull through the vertex without intravenous contrast. COMPARISON:  Head CT 10/25/2014 and earlier. FINDINGS: Chronic posterior right ethmoid sinus opacification is stable. Other Visualized paranasal sinuses and mastoids are clear. No acute scalp or orbits soft tissue finding. No acute osseous abnormality identified. Dystrophic calcification along the posterior right thalamus and midbrain re- demonstrated and unchanged since 2007. This might be the sequelae of remote hemorrhage or infection, uncertain. Stable cerebral volume. Stable ventricle size and configuration. No ventriculomegaly. Mild cerebral white matter  hypodensity is stable. No cortically based acute infarct identified. No suspicious intracranial vascular hyperdensity. No acute intracranial hemorrhage identified. IMPRESSION: No acute intracranial abnormality and stable noncontrast CT appearance of the brain. Chronic dystrophic calcification in the posterior right thalamus and midbrain. Electronically Signed   By: Genevie Ann M.D.   On: 01/18/2015 10:50   Dg Abd Acute W/chest  01/18/2015  CLINICAL DATA:  81 year old male with lower abdominal pain today and shortness of breath. Generalized weakness and confusion. Diabetes. Initial encounter. Current history of myelodysplastic syndrome. EXAM: DG ABDOMEN ACUTE W/ 1V CHEST COMPARISON:  01/13/2015 chest exam. CT Abdomen and Pelvis 10/28/2012. FINDINGS: Chronic right chest cardiac pacemaker. Right chest porta cath re- demonstrated. Stable cardiomegaly and mediastinal contours. Mildly increased opacity at the right lung base, favor small pleural effusion. Mildly increased pulmonary vascularity without overt edema. There is vague new increased right apical opacity (arrow). Left lung appear stable and clear. No pneumothorax or pneumoperitoneum. Non obstructed bowel gas pattern. Retained stool in the colon. Stable abdominal and pelvic visceral contours. Osteopenia. Chronic scoliosis. No acute osseous abnormality identified. Left inguinal surgical clips. IMPRESSION: 1. New vague right upper lobe/apical pulmonary opacity since 01/13/2015 with suspected new small right pleural effusion. Query pneumonia. 2.  Normal bowel gas pattern, no free air. Electronically Signed   By: Genevie Ann M.D.   On: 01/18/2015 11:02   I have personally reviewed and evaluated these images and lab results as part of my medical decision-making.   EKG Interpretation   Date/Time:  Friday January 18 2015 10:16:09 EST Ventricular Rate:  60 PR Interval:    QRS Duration: 159 QT Interval:  487 QTC Calculation: 487 R Axis:   -80  Text  Interpretation:  Ventricular-paced rhythm No significant change since  last tracing Confirmed by Chiron Campione ([id number]) on 01/18/2015 11:15:23 AM      MDM  Patient was seen and evaluated in stable condition with daughter at the bedside.  CBC consistent with patient history of pancytopenia.  Lactic acid elevated.  BNP elevated but not severely.  AAS showed vague right upper lobe/apical opacity in the right lung concerning for pneumonia and patient was started on Vanc/Zosyn.  Patient felt respiratory improvement with breathing treatments.  Cultures were sent.  Patient was given IV fluids for hydration.  Discussed all results and plan of care with patient and daughter at bedside who both expressed understanding and agreement.  Discussed with Dr. Charlies Silvers who agreed with admission and the patient was admitted to the stepdown unit under her care. Final diagnoses:  HCAP (healthcare-associated pneumonia)  Lactic acidosis    1. HCAP  2. Sepsis    Harvel Quale, MD 01/19/15 [phone]

## 2015-01-18 NOTE — Progress Notes (Signed)
CSW attempted to speak with patient. Nurse at beside.  Genice Rouge [id number] ED CSW 01/18/2015 10:25 AM

## 2015-01-19 DIAGNOSIS — D61818 Other pancytopenia: Secondary | ICD-10-CM

## 2015-01-19 LAB — CBC
HCT: 23.1 % — ABNORMAL LOW (ref 39.0–52.0)
HEMOGLOBIN: 7.9 g/dL — AB (ref 13.0–17.0)
MCH: 34.8 pg — ABNORMAL HIGH (ref 26.0–34.0)
MCHC: 34.2 g/dL (ref 30.0–36.0)
MCV: 101.8 fL — ABNORMAL HIGH (ref 78.0–100.0)
PLATELETS: 55 10*3/uL — AB (ref 150–400)
RBC: 2.27 MIL/uL — AB (ref 4.22–5.81)
RDW: 22.6 % — ABNORMAL HIGH (ref 11.5–15.5)
WBC: 2.2 10*3/uL — AB (ref 4.0–10.5)

## 2015-01-19 LAB — COMPREHENSIVE METABOLIC PANEL
ALK PHOS: 67 U/L (ref 38–126)
ALT: 49 U/L (ref 17–63)
ANION GAP: 11 (ref 5–15)
AST: 46 U/L — ABNORMAL HIGH (ref 15–41)
Albumin: 3 g/dL — ABNORMAL LOW (ref 3.5–5.0)
BUN: 22 mg/dL — ABNORMAL HIGH (ref 6–20)
CALCIUM: 9.2 mg/dL (ref 8.9–10.3)
CO2: 24 mmol/L (ref 22–32)
CREATININE: 0.83 mg/dL (ref 0.61–1.24)
Chloride: 101 mmol/L (ref 101–111)
Glucose, Bld: 193 mg/dL — ABNORMAL HIGH (ref 65–99)
Potassium: 5 mmol/L (ref 3.5–5.1)
SODIUM: 136 mmol/L (ref 135–145)
Total Bilirubin: 0.9 mg/dL (ref 0.3–1.2)
Total Protein: 6.3 g/dL — ABNORMAL LOW (ref 6.5–8.1)

## 2015-01-19 LAB — PREPARE RBC (CROSSMATCH)

## 2015-01-19 LAB — PHOSPHORUS: PHOSPHORUS: 3.6 mg/dL (ref 2.5–4.6)

## 2015-01-19 LAB — MAGNESIUM: Magnesium: 1.7 mg/dL (ref 1.7–2.4)

## 2015-01-19 MED ORDER — GUAIFENESIN ER 600 MG PO TB12
1200.0000 mg | ORAL_TABLET | Freq: Two times a day (BID) | ORAL | Status: DC
  Administered 2015-01-19 – 2015-01-22 (×7): 1200 mg via ORAL
  Filled 2015-01-19 (×7): qty 2

## 2015-01-19 MED ORDER — TBO-FILGRASTIM 480 MCG/0.8ML ~~LOC~~ SOSY
480.0000 ug | PREFILLED_SYRINGE | Freq: Once | SUBCUTANEOUS | Status: AC
  Administered 2015-01-19: 480 ug via SUBCUTANEOUS
  Filled 2015-01-19: qty 0.8

## 2015-01-19 MED ORDER — FUROSEMIDE 10 MG/ML IJ SOLN
40.0000 mg | Freq: Once | INTRAMUSCULAR | Status: AC
  Administered 2015-01-19: 40 mg via INTRAVENOUS
  Filled 2015-01-19: qty 4

## 2015-01-19 MED ORDER — MAGNESIUM HYDROXIDE 400 MG/5ML PO SUSP
30.0000 mL | Freq: Once | ORAL | Status: AC
  Administered 2015-01-19: 30 mL via ORAL
  Filled 2015-01-19: qty 30

## 2015-01-19 MED ORDER — MOMETASONE FURO-FORMOTEROL FUM 200-5 MCG/ACT IN AERO: 2.0000 | INHALATION_SPRAY | Freq: Two times a day (BID) | RESPIRATORY_TRACT | Status: DC

## 2015-01-19 MED ORDER — SODIUM CHLORIDE 0.9 % IJ SOLN
10.0000 mL | INTRAMUSCULAR | Status: DC | PRN
  Administered 2015-01-20 – 2015-01-22 (×3): 10 mL
  Filled 2015-01-19 (×3): qty 40

## 2015-01-19 MED ORDER — GUAIFENESIN-DM 100-10 MG/5ML PO SYRP: 5.0000 mL | ORAL_SOLUTION | ORAL | Status: DC | PRN

## 2015-01-19 MED ORDER — SODIUM CHLORIDE 0.9 % IJ SOLN
10.0000 mL | Freq: Two times a day (BID) | INTRAMUSCULAR | Status: DC
  Administered 2015-01-19: 10 mL

## 2015-01-19 MED ORDER — MOMETASONE FURO-FORMOTEROL FUM 200-5 MCG/ACT IN AERO
2.0000 | INHALATION_SPRAY | Freq: Two times a day (BID) | RESPIRATORY_TRACT | Status: DC
  Administered 2015-01-20 – 2015-01-22 (×5): 2 via RESPIRATORY_TRACT
  Filled 2015-01-19: qty 8.8

## 2015-01-19 MED ORDER — ALBUTEROL SULFATE (2.5 MG/3ML) 0.083% IN NEBU
2.5000 mg | INHALATION_SOLUTION | RESPIRATORY_TRACT | Status: DC | PRN
  Administered 2015-01-19: 2.5 mg via RESPIRATORY_TRACT
  Filled 2015-01-19: qty 3

## 2015-01-19 MED ORDER — POLYETHYLENE GLYCOL 3350 17 G PO PACK
17.0000 g | PACK | Freq: Every day | ORAL | Status: DC
  Administered 2015-01-19 – 2015-01-20 (×2): 17 g via ORAL
  Filled 2015-01-19 (×4): qty 1

## 2015-01-19 NOTE — Progress Notes (Signed)
[facility] PROGRESS NOTE   [name] IRC:[id number] DOB: October 17, 1932 DOA: 01/18/2015 PCP: Christinia Gully, MD  HPI/Subjective: Denies any new complaints, reported overall not feeling.  Assessment/Plan: Principal Problem:   Sepsis due to pneumonia [facility]) Active Problems:   Lactic acidosis   Pancytopenia (HCC)   MDS (myelodysplastic syndrome) (HCC)   Lobar pneumonia, unspecified organism (HCC)   Neutropenia (HCC)   Thrombocytopenia (HCC)   Anemia of chronic disease   Pacemaker   Chronic diastolic CHF (congestive heart failure) (HCC)    Sepsis due to pneumonia (HCC) / Lobar pneumonia, unspecified organism ([place]) / Lactic acidosis - Sepsis criteria met with hypotension, neutropenia, lactic acidosis - Source of infection is likely pneumonia. CXR on admission showed RUL opacity - Sepsis work up initiated - Pneumonia order set placed - Follow up blood cultures, procalcitonin level, strep pneumonia, legionella results - Empiric abx started, vanco and zosyn  - Added supportive management with bronchodilators, mucolytics, antitussives and oxygen as needed.   MDS (myelodysplastic syndrome) ([place]) / Follicular lymphoma  - Follows with Dr. [name] - Status post bone marrow biopsy on 06/22/2012.    Neutropenia ([place]) - Due to MDS - Start Neupogen - Check CBC daily    Thrombocytopenia (HCC) - Likely due to history of MDS - Platelets 63    Anemia of chronic disease - Due to history of MDS, recent blood transfusion on 11/03, 12/7 and last on 12/21.  - Hemoglobin 7.8 on admission, I will transfuse 1 unit of packed RBCs, keep hemoglobin above 8. - Continue to monitor CBC daily  - He gets aranesp inj every 2 weeks   Pacemaker - Stable    Code Status: Full Code Family Communication: Plan discussed with the patient. Disposition Plan: Remains inpatient Diet: Diet regular Room service appropriate?: Yes; Fluid consistency::  Thin  Consultants:  None  Procedures:  None  Antibiotics:  None   Objective: Filed Vitals:   01/18/15 2310 01/19/15 0600  BP: 137/47 151/76  Pulse: 64 62  Temp: 98.1 F (36.7 C) 97.6 F (36.4 C)  Resp: 20     Intake/Output Summary (Last 24 hours) at 01/19/15 1133 Last data filed at 01/19/15 0900  Gross per 24 hour  Intake 465.83 ml  Output    850 ml  Net -384.17 ml   Filed Weights   01/18/15 0921 01/18/15 1330 01/18/15 2310  Weight: 108.863 kg (240 lb) 108.7 kg (239 lb 10.2 oz) 111.1 kg (244 lb 14.9 oz)    Exam: General: Alert and awake, oriented x3, not in any acute distress. HEENT: anicteric sclera, pupils reactive to light and accommodation, EOMI CVS: S1-S2 clear, no murmur rubs or gallops Chest: clear to auscultation bilaterally, no wheezing, rales or rhonchi Abdomen: soft nontender, nondistended, normal bowel sounds, no organomegaly Extremities: no cyanosis, clubbing or edema noted bilaterally Neuro: Cranial nerves II-XII intact, no focal neurological deficits  Data Reviewed: Basic Metabolic Panel:  Recent Labs Lab 01/13/15 0515 01/18/15 0940 01/19/15 0532  NA 138 137 136  K 3.5 4.4 5.0  CL 100* 100* 101  CO2 _0 GLUCOSE 183* 308* 193*  BUN 17 24* 22*  CREATININE 0.97 0.92 0.83  CALCIUM 9.8 9.5 9.2  MG  --   --  1.7  PHOS  --   --  3.6   Liver Function Tests:  Recent Labs Lab 01/18/15 0940 01/19/15 0532  AST 27 46*  ALT 32 49  ALKPHOS 49 67  BILITOT 1.0 0.9  PROT 6.6 6.3*  ALBUMIN 3.2* 3.0*   No results for input(s): LIPASE, AMYLASE in the last 168 hours. No results for input(s): AMMONIA in the last 168 hours. CBC:  Recent Labs Lab 01/13/15 0515 01/16/15 0917 01/18/15 0940 01/19/15 0532  WBC 0.9* 0.9* 0.9* 2.2*  NEUTROABS 0.6* 0.5* 0.6*  --   HGB 8.7* 6.8* 7.8* 7.9*  HCT 25.2* 20.0* 23.4* 23.1*  MCV 103.3* 102.6* 102.6* 101.8*  PLT 65* 65* 63* 55*   Cardiac Enzymes: No results for input(s): CKTOTAL, CKMB,  CKMBINDEX, TROPONINI in the last 168 hours. BNP (last 3 results)  Recent Labs  01/18/15 0940  BNP 668.7*    ProBNP (last 3 results) No results for input(s): PROBNP in the last 8760 hours.  CBG:  Recent Labs Lab 01/13/15 0332  GLUCAP 224*    Micro Recent Results (from the past 240 hour(s))  MRSA PCR Screening     Status: Abnormal   Collection Time: 01/18/15  2:23 PM  Result Value Ref Range Status   MRSA by PCR POSITIVE (A) NEGATIVE Final    Comment:        The GeneXpert MRSA Assay (FDA approved for NASAL specimens only), is one component of a comprehensive MRSA colonization surveillance program. It is not intended to diagnose MRSA infection nor to guide or monitor treatment for MRSA infections. RESULT CALLED TO, READ BACK BY AND VERIFIED WITH: Wandra Scot RN 12.23.16 @ 1812 BY RICEJ      Studies: Ct Head Wo Contrast  01/18/2015  CLINICAL DATA:  81 year old male with generalized weakness, confusion, and fall 5 days ago. Initial encounter. EXAM: CT HEAD WITHOUT CONTRAST TECHNIQUE: Contiguous axial images were obtained from the base of the skull through the vertex without intravenous contrast. COMPARISON:  Head CT 10/25/2014 and earlier. FINDINGS: Chronic posterior right ethmoid sinus opacification is stable. Other Visualized paranasal sinuses and mastoids are clear. No acute scalp or orbits soft tissue finding. No acute osseous abnormality identified. Dystrophic calcification along the posterior right thalamus and midbrain re- demonstrated and unchanged since 2007. This might be the sequelae of remote hemorrhage or infection, uncertain. Stable cerebral volume. Stable ventricle size and configuration. No ventriculomegaly. Mild cerebral white matter hypodensity is stable. No cortically based acute infarct identified. No suspicious intracranial vascular hyperdensity. No acute intracranial hemorrhage identified. IMPRESSION: No acute intracranial abnormality and stable  noncontrast CT appearance of the brain. Chronic dystrophic calcification in the posterior right thalamus and midbrain. Electronically Signed   By: Genevie Ann M.D.   On: 01/18/2015 10:50   Dg Abd Acute W/chest  01/18/2015  CLINICAL DATA:  81 year old male with lower abdominal pain today and shortness of breath. Generalized weakness and confusion. Diabetes. Initial encounter. Current history of myelodysplastic syndrome. EXAM: DG ABDOMEN ACUTE W/ 1V CHEST COMPARISON:  01/13/2015 chest exam. CT Abdomen and Pelvis 10/28/2012. FINDINGS: Chronic right chest cardiac pacemaker. Right chest porta cath re- demonstrated. Stable cardiomegaly and mediastinal contours. Mildly increased opacity at the right lung base, favor small pleural effusion. Mildly increased pulmonary vascularity without overt edema. There is vague new increased right apical opacity (arrow). Left lung appear stable and clear. No pneumothorax or pneumoperitoneum. Non obstructed bowel gas pattern. Retained stool in the colon. Stable abdominal and pelvic visceral contours. Osteopenia. Chronic scoliosis. No acute osseous abnormality identified. Left inguinal surgical clips. IMPRESSION: 1. New vague right upper lobe/apical pulmonary opacity since 01/13/2015 with suspected new small right pleural effusion. Query pneumonia. 2.  Normal bowel gas pattern, no free air. Electronically Signed   By:  Genevie Ann M.D.   On: 01/18/2015 11:02    Scheduled Meds: . calcium-vitamin D  1 tablet Oral BID  . ceFEPime (MAXIPIME) IV  2 g Intravenous 3 times per day  . Chlorhexidine Gluconate Cloth  6 each Topical Q0600  . cholecalciferol  1,000 Units Oral BID  . famotidine  20 mg Oral QHS  . finasteride  5 mg Oral q morning - 10a  . fluticasone  1 spray Each Nare Daily  . levothyroxine  25 mcg Oral QAC breakfast  . metFORMIN  500 mg Oral BID WC  . multivitamin  1 tablet Oral Daily  . mupirocin ointment   Nasal BID  . oxybutynin  10 mg Oral BH-q7a  . pantoprazole  40 mg  Oral Daily  . traZODone  100 mg Oral QHS  . vancomycin  750 mg Intravenous Q12H   Continuous Infusions:      Time spent: 35 minutes    [facility] A  [facility] Pager [phone] If 7PM-7AM, please contact night-coverage at [email or web address], password [facility] 01/19/2015, 11:33 AM  LOS: 1 day

## 2015-01-19 NOTE — Evaluation (Signed)
Physical Therapy Evaluation Patient Details Name: [name] MRN: [id number] DOB: [birth year] Today's Date: 01/19/2015   History of Present Illness  81 y.o. male with h/o lymphoma and a recent fall admitted with sepsis due to PNA.   Clinical Impression  Pt admitted with above diagnosis. Pt currently with functional limitations due to the deficits listed below (see PT Problem List). Pt ambulated 30' with RW and 2L O2, distance limited by fatigue. ST-SNF recommended, pt wants to discuss this with his family prior to making a decision. He only has assist at home during the evening.  Pt will benefit from skilled PT to increase their independence and safety with mobility to allow discharge to the venue listed below.       Follow Up Recommendations SNF;Supervision/Assistance - 24 hour    Equipment Recommendations  None recommended by PT    Recommendations for Other Services       Precautions / Restrictions Precautions Precautions: Fall Precaution Comments: fall just prior to admission Restrictions Weight Bearing Restrictions: No      Mobility  Bed Mobility Overal bed mobility: Needs Assistance Bed Mobility: Supine to Sit     Supine to sit: Min assist     General bed mobility comments: min A to raise trunk  Transfers Overall transfer level: Needs assistance Equipment used: Rolling walker (2 wheeled) Transfers: Sit to/from Stand Sit to Stand: Min assist         General transfer comment: verbal cues for hand placement  Ambulation/Gait Ambulation/Gait assistance: Min guard Ambulation Distance (Feet): 30 Feet Assistive device: Rolling walker (2 wheeled) Gait Pattern/deviations: Step-through pattern;Decreased step length - right;Decreased step length - left;Trunk flexed   Gait velocity interpretation: Below normal speed for age/gender General Gait Details: Pt ambulated with 2L O2, SaO2 99%, distance limited by fatigue  Stairs            Wheelchair  Mobility    Modified Rankin (Stroke Patients Only)       Balance Overall balance assessment: Needs assistance   Sitting balance-Leahy Scale: Good     Standing balance support: Bilateral upper extremity supported Standing balance-Leahy Scale: Poor                               Pertinent Vitals/Pain Pain Assessment: No/denies pain    Home Living Family/patient expects to be discharged to:: Private residence Living Arrangements: Alone Available Help at Discharge: Family;Available PRN/intermittently (daughter checks in in the evening) Type of Home: House Home Access: Level entry     Home Layout: One level Home Equipment: Walker - 2 wheels;Cane - single point;Grab bars - toilet Additional Comments: walks with RW    Prior Function Level of Independence: Independent with assistive device(s)         Comments: daughter gets Actor Dominance        Extremity/Trunk Assessment   Upper Extremity Assessment: Overall WFL for tasks assessed           Lower Extremity Assessment: Overall WFL for tasks assessed      Cervical / Trunk Assessment: Kyphotic  Communication   Communication: HOH  Cognition Arousal/Alertness: Awake/alert Behavior During Therapy: WFL for tasks assessed/performed Overall Cognitive Status: Within Functional Limits for tasks assessed                      General Comments      Exercises  Assessment/Plan    PT Assessment Patient needs continued PT services  PT Diagnosis Generalized weakness;Difficulty walking   PT Problem List Decreased activity tolerance;Decreased mobility;Obesity;Decreased balance  PT Treatment Interventions Gait training;DME instruction;Functional mobility training;Therapeutic activities;Therapeutic exercise;Patient/family education   PT Goals (Current goals can be found in the Care Plan section) Acute Rehab PT Goals Patient Stated Goal: to walk farther PT Goal Formulation:  With patient Time For Goal Achievement: 02/02/15 Potential to Achieve Goals: Fair    Frequency Min 3X/week   Barriers to discharge Decreased caregiver support recommended SNF to pt, he stated he'd need to discuss this with his family, he'd be home alone most of the day    Co-evaluation               End of Session Equipment Utilized During Treatment: Gait belt;Oxygen Activity Tolerance: Patient limited by fatigue Patient left: in chair;with call bell/phone within reach;with chair alarm set           Time: 1212-1233 PT Time Calculation (min) (ACUTE ONLY): 21 min   Charges:   PT Evaluation $Initial PT Evaluation Tier I: 1 Procedure     PT G Codes:        Philomena Doheny 01/19/2015, 12:43 PM [phone]

## 2015-01-20 LAB — URINE CULTURE: Culture: 7000

## 2015-01-20 LAB — BASIC METABOLIC PANEL
ANION GAP: 10 (ref 5–15)
BUN: 16 mg/dL (ref 6–20)
CO2: 27 mmol/L (ref 22–32)
Calcium: 8.8 mg/dL — ABNORMAL LOW (ref 8.9–10.3)
Chloride: 99 mmol/L — ABNORMAL LOW (ref 101–111)
Creatinine, Ser: 0.75 mg/dL (ref 0.61–1.24)
GFR calc Af Amer: >60 mL/min (ref >60)
GLUCOSE: 164 mg/dL — AB (ref 65–99)
POTASSIUM: 3.9 mmol/L (ref 3.5–5.1)
Sodium: 136 mmol/L (ref 135–145)

## 2015-01-20 LAB — CBC WITH DIFFERENTIAL/PLATELET
BASOS ABS: 0 10*3/uL (ref 0.0–0.1)
BASOS PCT: 0 %
EOS ABS: 0 10*3/uL (ref 0.0–0.7)
Eosinophils Relative: 1 %
HCT: 22.8 % — ABNORMAL LOW (ref 39.0–52.0)
Hemoglobin: 8 g/dL — ABNORMAL LOW (ref 13.0–17.0)
LYMPHS ABS: 0.4 10*3/uL — AB (ref 0.7–4.0)
LYMPHS PCT: 19 %
MCH: 34.9 pg — AB (ref 26.0–34.0)
MCHC: 35.1 g/dL (ref 30.0–36.0)
MCV: 99.6 fL (ref 78.0–100.0)
MONO ABS: 0 10*3/uL — AB (ref 0.1–1.0)
Monocytes Relative: 2 %
NEUTROS ABS: 1.7 10*3/uL (ref 1.7–7.7)
Neutrophils Relative %: 78 %
PLATELETS: 52 10*3/uL — AB (ref 150–400)
RBC: 2.29 MIL/uL — ABNORMAL LOW (ref 4.22–5.81)
RDW: 22.1 % — AB (ref 11.5–15.5)
WBC: 2.1 10*3/uL — ABNORMAL LOW (ref 4.0–10.5)

## 2015-01-20 LAB — STREP PNEUMONIAE URINARY ANTIGEN: Strep Pneumo Urinary Antigen: NEGATIVE

## 2015-01-20 LAB — VANCOMYCIN, TROUGH: Vancomycin Tr: 10 ug/mL (ref 10.0–20.0)

## 2015-01-20 LAB — LACTIC ACID, PLASMA: Lactic Acid, Venous: 2.1 mmol/L (ref 0.5–2.0)

## 2015-01-20 MED ORDER — SODIUM CHLORIDE 0.9 % IV BOLUS (SEPSIS)
250.0000 mL | Freq: Once | INTRAVENOUS | Status: AC
  Administered 2015-01-20: 250 mL via INTRAVENOUS

## 2015-01-20 MED ORDER — FUROSEMIDE 10 MG/ML IJ SOLN
40.0000 mg | Freq: Once | INTRAMUSCULAR | Status: AC
  Administered 2015-01-20: 40 mg via INTRAVENOUS
  Filled 2015-01-20: qty 4

## 2015-01-20 MED ORDER — TBO-FILGRASTIM 480 MCG/0.8ML ~~LOC~~ SOSY
480.0000 ug | PREFILLED_SYRINGE | Freq: Once | SUBCUTANEOUS | Status: AC
  Administered 2015-01-20: 480 ug via SUBCUTANEOUS
  Filled 2015-01-20: qty 0.8

## 2015-01-20 MED ORDER — POTASSIUM CHLORIDE CRYS ER 20 MEQ PO TBCR
30.0000 meq | EXTENDED_RELEASE_TABLET | Freq: Once | ORAL | Status: AC
  Administered 2015-01-20: 30 meq via ORAL
  Filled 2015-01-20: qty 1

## 2015-01-20 MED ORDER — VANCOMYCIN HCL IN DEXTROSE 1-5 GM/200ML-% IV SOLN
1000.0000 mg | Freq: Two times a day (BID) | INTRAVENOUS | Status: DC
  Administered 2015-01-20 – 2015-01-22 (×4): 1000 mg via INTRAVENOUS
  Filled 2015-01-20 (×4): qty 200

## 2015-01-20 NOTE — Progress Notes (Signed)
[facility] PROGRESS NOTE   [name] [id number] DOB: [birth year] DOA: 01/18/2015 PCP: Christinia Gully, MD  HPI/Subjective: Seen while he was eating his breakfast, feels okay much better than yesterday. Given 1 unit of packed RBCs yesterday. And one dose of Lasix.  Assessment/Plan: Principal Problem:   Sepsis due to pneumonia [facility]) Active Problems:   Lactic acidosis   Pancytopenia (HCC)   MDS (myelodysplastic syndrome) (HCC)   Lobar pneumonia, unspecified organism (HCC)   Neutropenia (HCC)   Thrombocytopenia (HCC)   Anemia of chronic disease   Pacemaker   Chronic diastolic CHF (congestive heart failure) (HCC)    Sepsis due to pneumonia (HCC) / Lobar pneumonia, unspecified organism ([place]) / Lactic acidosis - Sepsis criteria met with hypotension, neutropenia, lactic acidosis - Source of infection is likely pneumonia. CXR on admission showed RUL opacity - Sepsis work up initiated - Pneumonia order set placed - Follow up blood cultures, procalcitonin level, strep pneumonia, legionella results - Empiric abx started, vanco and zosyn  - Added supportive management with bronchodilators, mucolytics, antitussives and oxygen as needed. - No change in the respiratory regimen.   MDS (myelodysplastic syndrome) ([place]) / Follicular lymphoma  - Follows with Dr. [name] - Status post bone marrow biopsy on 06/22/2012.    Neutropenia ([place]) - Due to MDS - Start Neupogen - Check CBC daily    Thrombocytopenia (HCC) - Likely due to history of MDS - Platelets 63    Anemia of chronic disease - Due to history of MDS, recent blood transfusion on 11/03, 12/7 and last on 12/21.  - Hemoglobin 7.8 on admission. - Status post transfusion of 1 packed RBC, hemoglobin is 8.0, also received Lasix posttransfusion. - Repeat Lasix.   Pacemaker - Stable    Code Status: Full Code Family Communication: Plan discussed with the patient. Disposition Plan: Remains inpatient Diet:  Diet regular Room service appropriate?: Yes; Fluid consistency:: Thin  Consultants:  None  Procedures:  None  Antibiotics:  None   Objective: Filed Vitals:   01/19/15 2113 01/20/15 0441  BP: 133/64 132/73  Pulse: 60 72  Temp: 98.2 F (36.8 C) 98.3 F (36.8 C)  Resp: 18 18    Intake/Output Summary (Last 24 hours) at 01/20/15 0918 Last data filed at 01/20/15 6269  Gross per 24 hour  Intake    940 ml  Output    350 ml  Net    590 ml   Filed Weights   01/18/15 0921 01/18/15 1330 01/18/15 2310  Weight: 108.863 kg (240 lb) 108.7 kg (239 lb 10.2 oz) 111.1 kg (244 lb 14.9 oz)    Exam: General: Alert and awake, oriented x3, not in any acute distress. HEENT: anicteric sclera, pupils reactive to light and accommodation, EOMI CVS: S1-S2 clear, no murmur rubs or gallops Chest: clear to auscultation bilaterally, no wheezing, rales or rhonchi Abdomen: soft nontender, nondistended, normal bowel sounds, no organomegaly Extremities: no cyanosis, clubbing or edema noted bilaterally Neuro: Cranial nerves II-XII intact, no focal neurological deficits  Data Reviewed: Basic Metabolic Panel:  Recent Labs Lab 01/18/15 0940 01/19/15 0532 01/20/15 0429  NA 137 136 136  K 4.4 5.0 3.9  CL 100* 101 99*  CO2 _0 GLUCOSE 308* 193* 164*  BUN 24* 22* 16  CREATININE 0.92 0.83 0.75  CALCIUM 9.5 9.2 8.8*  MG  --  1.7  --   PHOS  --  3.6  --    Liver Function Tests:  Recent Labs Lab 01/18/15 0940 01/19/15  0532  AST 27 46*  ALT 32 49  ALKPHOS 49 67  BILITOT 1.0 0.9  PROT 6.6 6.3*  ALBUMIN 3.2* 3.0*   No results for input(s): LIPASE, AMYLASE in the last 168 hours. No results for input(s): AMMONIA in the last 168 hours. CBC:  Recent Labs Lab 01/16/15 0917 01/18/15 0940 01/19/15 0532 01/20/15 0429  WBC 0.9* 0.9* 2.2* 2.1*  NEUTROABS 0.5* 0.6*  --  1.7  HGB 6.8* 7.8* 7.9* 8.0*  HCT 20.0* 23.4* 23.1* 22.8*  MCV 102.6* 102.6* 101.8* 99.6  PLT 65* 63* 55* 52*    Cardiac Enzymes: No results for input(s): CKTOTAL, CKMB, CKMBINDEX, TROPONINI in the last 168 hours. BNP (last 3 results)  Recent Labs  01/18/15 0940  BNP 668.7*    ProBNP (last 3 results) No results for input(s): PROBNP in the last 8760 hours.  CBG: No results for input(s): GLUCAP in the last 168 hours.  Micro Recent Results (from the past 240 hour(s))  Blood Culture (routine x 2)     Status: None (Preliminary result)   Collection Time: 01/18/15  9:40 AM  Result Value Ref Range Status   Specimen Description BLOOD RIGHT PORTA CATH  Final   Special Requests BOTTLES DRAWN AEROBIC AND ANAEROBIC 10CC  Final   Culture   Final    NO GROWTH < 24 HOURS Performed at [facility]    Report Status PENDING  Incomplete  Blood Culture (routine x 2)     Status: None (Preliminary result)   Collection Time: 01/18/15 10:58 AM  Result Value Ref Range Status   Specimen Description BLOOD LEFT ANTECUBITAL  Final   Special Requests BOTTLES DRAWN AEROBIC AND ANAEROBIC 10CC  Final   Culture   Final    NO GROWTH < 24 HOURS Performed at [facility]    Report Status PENDING  Incomplete  Urine culture     Status: None (Preliminary result)   Collection Time: 01/18/15 12:47 PM  Result Value Ref Range Status   Specimen Description URINE, CLEAN CATCH  Final   Special Requests NONE  Final   Culture   Final    TOO YOUNG TO READ Performed at [facility]    Report Status PENDING  Incomplete  MRSA PCR Screening     Status: Abnormal   Collection Time: 01/18/15  2:23 PM  Result Value Ref Range Status   MRSA by PCR POSITIVE (A) NEGATIVE Final    Comment:        The GeneXpert MRSA Assay (FDA approved for NASAL specimens only), is one component of a comprehensive MRSA colonization surveillance program. It is not intended to diagnose MRSA infection nor to guide or monitor treatment for MRSA infections. RESULT CALLED TO, READ BACK BY AND VERIFIED WITH: Wandra Scot RN  12.23.16 @ 1812 BY RICEJ      Studies: Ct Head Wo Contrast  01/18/2015  CLINICAL DATA:  81 year old male with generalized weakness, confusion, and fall 5 days ago. Initial encounter. EXAM: CT HEAD WITHOUT CONTRAST TECHNIQUE: Contiguous axial images were obtained from the base of the skull through the vertex without intravenous contrast. COMPARISON:  Head CT 10/25/2014 and earlier. FINDINGS: Chronic posterior right ethmoid sinus opacification is stable. Other Visualized paranasal sinuses and mastoids are clear. No acute scalp or orbits soft tissue finding. No acute osseous abnormality identified. Dystrophic calcification along the posterior right thalamus and midbrain re- demonstrated and unchanged since 2007. This might be the sequelae of remote hemorrhage or infection, uncertain.  Stable cerebral volume. Stable ventricle size and configuration. No ventriculomegaly. Mild cerebral white matter hypodensity is stable. No cortically based acute infarct identified. No suspicious intracranial vascular hyperdensity. No acute intracranial hemorrhage identified. IMPRESSION: No acute intracranial abnormality and stable noncontrast CT appearance of the brain. Chronic dystrophic calcification in the posterior right thalamus and midbrain. Electronically Signed   By: Genevie Ann M.D.   On: 01/18/2015 10:50   Dg Abd Acute W/chest  01/18/2015  CLINICAL DATA:  81 year old male with lower abdominal pain today and shortness of breath. Generalized weakness and confusion. Diabetes. Initial encounter. Current history of myelodysplastic syndrome. EXAM: DG ABDOMEN ACUTE W/ 1V CHEST COMPARISON:  01/13/2015 chest exam. CT Abdomen and Pelvis 10/28/2012. FINDINGS: Chronic right chest cardiac pacemaker. Right chest porta cath re- demonstrated. Stable cardiomegaly and mediastinal contours. Mildly increased opacity at the right lung base, favor small pleural effusion. Mildly increased pulmonary vascularity without overt edema. There is  vague new increased right apical opacity (arrow). Left lung appear stable and clear. No pneumothorax or pneumoperitoneum. Non obstructed bowel gas pattern. Retained stool in the colon. Stable abdominal and pelvic visceral contours. Osteopenia. Chronic scoliosis. No acute osseous abnormality identified. Left inguinal surgical clips. IMPRESSION: 1. New vague right upper lobe/apical pulmonary opacity since 01/13/2015 with suspected new small right pleural effusion. Query pneumonia. 2.  Normal bowel gas pattern, no free air. Electronically Signed   By: Genevie Ann M.D.   On: 01/18/2015 11:02    Scheduled Meds: . calcium-vitamin D  1 tablet Oral BID  . ceFEPime (MAXIPIME) IV  2 g Intravenous 3 times per day  . Chlorhexidine Gluconate Cloth  6 each Topical Q0600  . cholecalciferol  1,000 Units Oral BID  . famotidine  20 mg Oral QHS  . finasteride  5 mg Oral q morning - 10a  . fluticasone  1 spray Each Nare Daily  . furosemide  40 mg Intravenous Once  . guaiFENesin  1,200 mg Oral BID  . levothyroxine  25 mcg Oral QAC breakfast  . metFORMIN  500 mg Oral BID WC  . mometasone-formoterol  2 puff Inhalation BID  . multivitamin  1 tablet Oral Daily  . mupirocin ointment   Nasal BID  . oxybutynin  10 mg Oral BH-q7a  . pantoprazole  40 mg Oral Daily  . polyethylene glycol  17 g Oral Daily  . sodium chloride  10-40 mL Intracatheter Q12H  . traZODone  100 mg Oral QHS  . vancomycin  750 mg Intravenous Q12H   Continuous Infusions:      Time spent: 35 minutes    [facility] A  [facility] Pager [phone] If 7PM-7AM, please contact night-coverage at [email or web address], password [facility] 01/20/2015, 9:18 AM  LOS: 2 days

## 2015-01-20 NOTE — Progress Notes (Signed)
CRITICAL VALUE ALERT  Critical value received:  Lactic Acid 2.1  Date of notification:  01/20/2015  Time of notification:  0523  Critical value read back:yes  Nurse who received alert:  Harlene Ramus  MD notified (1st page):  Rogue Bussing  Time of first page:  0526  MD notified (2nd page):  Time of second page:  Responding MD:  Rogue Bussing  Time MD responded:  [id number]

## 2015-01-20 NOTE — Progress Notes (Signed)
ANTIBIOTIC CONSULT NOTE - follow up  Pharmacy Consult for vancomycin/Zosyn Indication: PNA/sepsis  No Known Allergies  Patient Measurements: Height: 5\' 11"  (180.3 cm) Weight: 244 lb 14.9 oz (111.1 kg) IBW/kg (Calculated) : 75.3 Adjusted Body Weight:   Vital Signs: Temp: 98.3 F (36.8 C) (12/25 0441) Temp Source: Oral (12/25 0441) BP: 132/73 mmHg (12/25 0441) Pulse Rate: 72 (12/25 0441) Intake/Output from previous day: 12/24 0701 - 12/25 0700 In: 730 [P.O.:300; Blood:380; IV Piggyback:50] Out: 725 [Urine:725] Intake/Output from this shift: Total I/O In: 450 [IV Piggyback:450] Out: -   Labs:  Recent Labs  01/18/15 0940 01/19/15 0532 01/20/15 0429  WBC 0.9* 2.2* 2.1*  HGB 7.8* 7.9* 8.0*  PLT 63* 55* 52*  CREATININE 0.92 0.83 0.75   Estimated Creatinine Clearance: 93.3 mL/min (by C-G formula based on Cr of 0.75).  Recent Labs  01/20/15 1100  [place] 10     Microbiology: Recent Results (from the past 720 hour(s))  Blood Culture (routine x 2)     Status: None (Preliminary result)   Collection Time: 01/18/15  9:40 AM  Result Value Ref Range Status   Specimen Description BLOOD RIGHT PORTA CATH  Final   Special Requests BOTTLES DRAWN AEROBIC AND ANAEROBIC 10CC  Final   Culture   Final    NO GROWTH < 24 HOURS Performed at [facility]    Report Status PENDING  Incomplete  Blood Culture (routine x 2)     Status: None (Preliminary result)   Collection Time: 01/18/15 10:58 AM  Result Value Ref Range Status   Specimen Description BLOOD LEFT ANTECUBITAL  Final   Special Requests BOTTLES DRAWN AEROBIC AND ANAEROBIC 10CC  Final   Culture   Final    NO GROWTH < 24 HOURS Performed at [facility]    Report Status PENDING  Incomplete  Urine culture     Status: None (Preliminary result)   Collection Time: 01/18/15 12:47 PM  Result Value Ref Range Status   Specimen Description URINE, CLEAN CATCH  Final   Special Requests NONE  Final   Culture    Final    TOO YOUNG TO READ Performed at [facility]    Report Status PENDING  Incomplete  MRSA PCR Screening     Status: Abnormal   Collection Time: 01/18/15  2:23 PM  Result Value Ref Range Status   MRSA by PCR POSITIVE (A) NEGATIVE Final    Comment:        The GeneXpert MRSA Assay (FDA approved for NASAL specimens only), is one component of a comprehensive MRSA colonization surveillance program. It is not intended to diagnose MRSA infection nor to guide or monitor treatment for MRSA infections. RESULT CALLED TO, READ BACK BY AND VERIFIED WITH: Wandra Scot RN 12.23.16 @ 1812 BY RICEJ     Medical History: Past Medical History  Diagnosis Date  . Second degree Mobitz II AV block 12/11/08    with transient syncope, resolved s/p PPM  . Paroxysmal atrial fibrillation ([place]) 03/16/11    diagnosed by PPM interrogation    Medications:  Scheduled:  . calcium-vitamin D  1 tablet Oral BID  . ceFEPime (MAXIPIME) IV  2 g Intravenous 3 times per day  . Chlorhexidine Gluconate Cloth  6 each Topical Q0600  . cholecalciferol  1,000 Units Oral BID  . famotidine  20 mg Oral QHS  . finasteride  5 mg Oral q morning - 10a  . fluticasone  1 spray Each Nare Daily  .  guaiFENesin  1,200 mg Oral BID  . levothyroxine  25 mcg Oral QAC breakfast  . metFORMIN  500 mg Oral BID WC  . mometasone-formoterol  2 puff Inhalation BID  . multivitamin  1 tablet Oral Daily  . mupirocin ointment   Nasal BID  . oxybutynin  10 mg Oral BH-q7a  . pantoprazole  40 mg Oral Daily  . polyethylene glycol  17 g Oral Daily  . sodium chloride  10-40 mL Intracatheter Q12H  . Tbo-Filgrastim  480 mcg Subcutaneous Once  . traZODone  100 mg Oral QHS  . vancomycin  750 mg Intravenous Q12H   Assessment: Pt is a 81 yo male with suspected sepsis/PNA. PMH includes follicular lymphoma in remission, MDS with pancytopenia. Pt has been ordered filgastrim 480 mcg x 1. Code sepsis called.   Today, 01/20/2015  Vanc  trough subtherapeutic on vanc 750mg  q12  Afebrile, WBC 2.1, Scr stable   12/23 >> vancomycin >> 12/23 >> Zosyn >>    12/23 blood x2: ngtd 12/23 urine: ngtd 12/23 Legionella antigen 12/23 Strep pneumo antigen: neg  Goal of Therapy:  Vancomycin trough level 15-20 mcg/ml  Follow up culture results   Plan:  Change vanc from 750mg  IV q12 to 1g IV q12   Adrian Saran, PharmD, BCPS Pager [phone] 01/20/2015 2:10 PM

## 2015-01-21 LAB — CBC WITH DIFFERENTIAL/PLATELET
BASOS ABS: 0 10*3/uL (ref 0.0–0.1)
Basophils Relative: 0 %
EOS ABS: 0 10*3/uL (ref 0.0–0.7)
Eosinophils Relative: 1 %
HCT: 21.9 % — ABNORMAL LOW (ref 39.0–52.0)
Hemoglobin: 7.7 g/dL — ABNORMAL LOW (ref 13.0–17.0)
Lymphocytes Relative: 17 %
Lymphs Abs: 0.3 10*3/uL — ABNORMAL LOW (ref 0.7–4.0)
MCH: 34.8 pg — ABNORMAL HIGH (ref 26.0–34.0)
MCHC: 35.2 g/dL (ref 30.0–36.0)
MCV: 99.1 fL (ref 78.0–100.0)
MONO ABS: 0 10*3/uL — AB (ref 0.1–1.0)
Monocytes Relative: 1 %
NEUTROS PCT: 81 %
Neutro Abs: 1.6 10*3/uL — ABNORMAL LOW (ref 1.7–7.7)
PLATELETS: 53 10*3/uL — AB (ref 150–400)
RBC: 2.21 MIL/uL — AB (ref 4.22–5.81)
RDW: 21.3 % — AB (ref 11.5–15.5)
WBC: 1.9 10*3/uL — AB (ref 4.0–10.5)

## 2015-01-21 LAB — BASIC METABOLIC PANEL
ANION GAP: 11 (ref 5–15)
BUN: 15 mg/dL (ref 6–20)
CALCIUM: 8.7 mg/dL — AB (ref 8.9–10.3)
CO2: 26 mmol/L (ref 22–32)
CREATININE: 0.73 mg/dL (ref 0.61–1.24)
Chloride: 98 mmol/L — ABNORMAL LOW (ref 101–111)
Glucose, Bld: 168 mg/dL — ABNORMAL HIGH (ref 65–99)
Potassium: 3.5 mmol/L (ref 3.5–5.1)
SODIUM: 135 mmol/L (ref 135–145)

## 2015-01-21 LAB — LEGIONELLA ANTIGEN, URINE

## 2015-01-21 LAB — PREPARE RBC (CROSSMATCH)

## 2015-01-21 MED ORDER — FUROSEMIDE 10 MG/ML IJ SOLN
40.0000 mg | Freq: Once | INTRAMUSCULAR | Status: AC
  Administered 2015-01-21: 40 mg via INTRAVENOUS
  Filled 2015-01-21: qty 4

## 2015-01-21 MED ORDER — POTASSIUM CHLORIDE CRYS ER 20 MEQ PO TBCR
20.0000 meq | EXTENDED_RELEASE_TABLET | Freq: Once | ORAL | Status: AC
  Administered 2015-01-21: 20 meq via ORAL
  Filled 2015-01-21: qty 1

## 2015-01-21 MED ORDER — POTASSIUM CHLORIDE CRYS ER 20 MEQ PO TBCR
40.0000 meq | EXTENDED_RELEASE_TABLET | Freq: Once | ORAL | Status: AC
  Administered 2015-01-21: 40 meq via ORAL
  Filled 2015-01-21: qty 2

## 2015-01-21 NOTE — NC FL2 (Signed)
[place] LEVEL OF CARE SCREENING TOOL     IDENTIFICATION  Patient Name: [name] Birthdate: Aug 11, 1932 Sex: male Admission Date (Current Location): 01/18/2015  [facility] and [place] Number:  Herbalist and Address:  [facility],  [place] [address] [place]      Provider Number: [id number]  Attending Physician Name and Address:  Verlee Monte, MD  Relative Name and Phone Number:       Current Level of Care: Hospital Recommended Level of Care: [place] Prior Approval Number:    Date Approved/Denied:   PASRR Number:  ([id number])  Discharge Plan: SNF    Current Diagnoses: Patient Active Problem List   Diagnosis Date Noted  . Sepsis due to pneumonia ([place]) 01/18/2015  . Lobar pneumonia, unspecified organism ([place]) 01/18/2015  . Neutropenia ([place]) 01/18/2015  . Thrombocytopenia ([place]) 01/18/2015  . Anemia of chronic disease 01/18/2015  . Pacemaker 01/18/2015  . Chronic diastolic CHF (congestive heart failure) ([place]) 01/18/2015  . MDS (myelodysplastic syndrome) ([place])   . Pancytopenia ([place]) 12/06/2012  . Lactic acidosis 10/28/2012    Orientation RESPIRATION BLADDER Height & Weight    Self, Time, Situation, Place  O2 (2L) Continent 5\' 11"  (180.3 cm) 244 lbs.  BEHAVIORAL SYMPTOMS/MOOD NEUROLOGICAL BOWEL NUTRITION STATUS      Continent Diet (regular diet)  AMBULATORY STATUS COMMUNICATION OF NEEDS Skin   Limited Assist Verbally Normal                       Personal Care Assistance Level of Assistance  Bathing, Feeding, Dressing Bathing Assistance: Limited assistance Feeding assistance: Limited assistance Dressing Assistance: Limited assistance     Functional Limitations Info             SPECIAL CARE FACTORS FREQUENCY                       Contractures      Additional Factors Info  Code Status, Allergies Code Status Info: Full code status Allergies Info: NKDA            Current Medications (01/21/2015):  This is the current hospital active medication list Current Facility-Administered Medications  Medication Dose Route Frequency Provider Last Rate Last Dose  . acetaminophen (TYLENOL) tablet 500 mg  500 mg Oral Q6H PRN Robbie Lis, MD      . albuterol (PROVENTIL) (2.5 MG/3ML) 0.083% nebulizer solution 2.5 mg  2.5 mg Nebulization Q4H PRN Dianne Dun, NP   2.5 mg at 01/19/15 2216  . calcium-vitamin D (OSCAL WITH D) 500-200 MG-UNIT per tablet 1 tablet  1 tablet Oral BID Robbie Lis, MD   1 tablet at 01/21/15 1033  . ceFEPIme (MAXIPIME) 2 g in dextrose 5 % 50 mL IVPB  2 g Intravenous 3 times per day Julieta Bellini Absher, RPH   2 g at 01/21/15 1321  . Chlorhexidine Gluconate Cloth 2 % PADS 6 each  6 each Topical Q0600 Robbie Lis, MD   6 each at 01/21/15 [phone]  . cholecalciferol (VITAMIN D) tablet 1,000 Units  1,000 Units Oral BID Robbie Lis, MD   1,000 Units at 01/21/15 1033  . docusate sodium (COLACE) capsule 100 mg  100 mg Oral Daily PRN Robbie Lis, MD   100 mg at 01/19/15 1020  . famotidine (PEPCID) tablet 20 mg  20 mg Oral QHS Robbie Lis, MD   20 mg at 01/20/15 2104  .  finasteride (PROSCAR) tablet 5 mg  5 mg Oral q morning - 10a Robbie Lis, MD   5 mg at 01/21/15 1033  . fluticasone (FLONASE) 50 MCG/ACT nasal spray 1 spray  1 spray Each Nare Daily Robbie Lis, MD   1 spray at 01/20/15 0932  . guaiFENesin (MUCINEX) 12 hr tablet 1,200 mg  1,200 mg Oral BID Verlee Monte, MD   1,200 mg at 01/21/15 1033  . guaiFENesin-dextromethorphan (ROBITUSSIN DM) 100-10 MG/5ML syrup 5 mL  5 mL Oral Q4H PRN Verlee Monte, MD      . levothyroxine (SYNTHROID, LEVOTHROID) tablet 25 mcg  25 mcg Oral QAC breakfast Robbie Lis, MD   25 mcg at 01/21/15 0755  . metFORMIN (GLUCOPHAGE) tablet 500 mg  500 mg Oral BID WC Robbie Lis, MD   500 mg at 01/21/15 0755  . mometasone-formoterol (DULERA) 200-5 MCG/ACT inhaler 2 puff  2 puff Inhalation BID Dianne Dun, NP   2 puff at 01/21/15 0859  . multivitamin tablet 1 tablet  1 tablet Oral Daily Robbie Lis, MD   1 tablet at 01/21/15 1055  . mupirocin ointment (BACTROBAN) 2 %   Nasal BID Robbie Lis, MD      . oxybutynin (DITROPAN-XL) 24 hr tablet 10 mg  10 mg Oral BH-q7a Alma M Devine, MD   10 mg at 01/21/15 0536  . pantoprazole (PROTONIX) EC tablet 40 mg  40 mg Oral Daily Robbie Lis, MD   40 mg at 01/21/15 1033  . polyethylene glycol (MIRALAX / GLYCOLAX) packet 17 g  17 g Oral Daily Verlee Monte, MD   17 g at 01/20/15 0931  . sodium chloride 0.9 % injection 10-40 mL  10-40 mL Intracatheter Q12H Robbie Lis, MD   10 mL at 01/19/15 1500  . sodium chloride 0.9 % injection 10-40 mL  10-40 mL Intracatheter PRN Robbie Lis, MD   10 mL at 01/21/15 0332  . traMADol (ULTRAM) tablet 50 mg  50 mg Oral Q6H PRN Robbie Lis, MD      . traZODone (DESYREL) tablet 100 mg  100 mg Oral QHS Robbie Lis, MD   100 mg at 01/20/15 2104  . vancomycin (VANCOCIN) IVPB 1000 mg/200 mL premix  1,000 mg Intravenous Q12H Adrian Saran, RPH   1,000 mg at 01/21/15 1539     Discharge Medications: Please see discharge summary for a list of discharge medications.  Relevant Imaging Results:  Relevant Lab Results:   Additional Information  (SSN: [id number]  Contact Precautions: MRSA (nasal swab))  Standley Brooking, LCSW

## 2015-01-21 NOTE — Care Management Note (Signed)
Case Management Note  Patient Details  Name: ORAZIO FRASCA MRN: [id number] Date of Birth: [birth year]  Subjective/Objective:    Admitted with Sepsis                Action/Plan:  Plan to dc to SNF   Expected Discharge Date:                  Expected Discharge Plan:  Skilled Nursing Facility  In-House Referral:  Clinical Social Work  Discharge planning Services     Post Acute Care Choice:    Choice offered to:     DME Arranged:    DME Agency:     HH Arranged:    [place] Agency:     Status of Service:  In process, will continue to follow  Medicare Important Message Given:    Date Medicare IM Given:    Medicare IM give by:    Date Additional Medicare IM Given:    Additional Medicare Important Message give by:     If discussed at [place] of Stay Meetings, dates discussed:    Additional CommentsPurcell Mouton, RN 01/21/2015, 3:58 PM

## 2015-01-21 NOTE — Progress Notes (Signed)
OT  Note  Patient Details Name: [name] MRN: [id number] DOB: 05-09-1932   Cancelled Treatment: Noted plan for SNF- will defer OT eval to SNF/ Kari Baars, [place] Payton Mccallum D 01/21/2015, 12:59 PM

## 2015-01-21 NOTE — Clinical Social Work Note (Signed)
Clinical Social Work Assessment  Patient Details  Name: [name] MRN: [id number] Date of Birth: [birth year]  Date of referral:  01/21/15               Reason for consult:  Facility Placement                Permission sought to share information with:  Chartered certified accountant granted to share information::  Yes, Verbal Permission Granted  Name::        Agency::     Relationship::     Contact Information:     Housing/Transportation Living arrangements for the past 2 months:  Single Family Home Source of Information:  Patient, Adult Children Patient Interpreter Needed:  None Criminal Activity/Legal Involvement Pertinent to Current Situation/Hospitalization:  No - Comment as needed Significant Relationships:  Adult Children Lives with:  Self Do you feel safe going back to the place where you live?  No Need for family participation in patient care:  Yes (Comment)  Care giving concerns:  CSW received consult for SNF placement, reviewed PT evaluation recommending SNF at discharge.    Social Worker assessment / plan:  CSW spoke with patient at bedside along with daughter, [name] & son, Koji. Patient is agreeable with plan for SNF.   Employment status:  Retired Forensic scientist:  Medicare PT Recommendations:  [place] / Referral to community resources:  [place]  Patient/Family's Response to care:  Patient is agreeable with plan for SNF, seems that daughter has hesitations towards placement.   Patient/Family's Understanding of and Emotional Response to Diagnosis, Current Treatment, and Prognosis:  Patient understands diagnosis & need for SNF placement.   Emotional Assessment Appearance:  Appears stated age Attitude/Demeanor/Rapport:    Affect (typically observed):  Pleasant, Accepting Orientation:  Oriented to Self, Oriented to Place, Oriented to  Time, Oriented to Situation Alcohol / Substance use:     Psych involvement (Current and /or in the community):     Discharge Needs  Concerns to be addressed:    Readmission within the last 30 days:    Current discharge risk:    Barriers to Discharge:      Standley Brooking, LCSW 01/21/2015, 3:57 PM

## 2015-01-21 NOTE — Progress Notes (Addendum)
Physical Therapy Treatment Patient Details Name: [name] MRN: [id number] DOB: December 06, 1932 Today's Date: 01/21/2015    History of Present Illness 81 y.o. male with h/o lymphoma and a recent fall admitted with sepsis due to PNA.     PT Comments    Pt ambulated 35' with RW and supervision for safety. SaO2 95% on RA with walking.   Follow Up Recommendations  SNF;Supervision/Assistance - 24 hour     Equipment Recommendations  None recommended by PT    Recommendations for Other Services       Precautions / Restrictions Precautions Precautions: Fall Precaution Comments: fall just prior to admission Restrictions Weight Bearing Restrictions: No    Mobility  Bed Mobility               General bed mobility comments: NT-up in recliner  Transfers Overall transfer level: Needs assistance Equipment used: Rolling walker (2 wheeled) Transfers: Sit to/from Stand Sit to Stand: Supervision         General transfer comment: verbal cues for hand placement  Ambulation/Gait Ambulation/Gait assistance: Supervision Ambulation Distance (Feet): 80 Feet Assistive device: Rolling walker (2 wheeled) Gait Pattern/deviations: Step-through pattern;Decreased step length - right;Decreased step length - left     General Gait Details: SaO2 95% on RA with walking, 2/4 dyspnea, no LOB, supervision for safety   Stairs            Wheelchair Mobility    Modified Rankin (Stroke Patients Only)       Balance     Sitting balance-Leahy Scale: Good       Standing balance-Leahy Scale: Poor                      Cognition Arousal/Alertness: Awake/alert Behavior During Therapy: Impulsive (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, feels he needs to "go to a home") Overall Cognitive Status: No family/caregiver present to determine baseline cognitive functioning                      Exercises      General Comments        Pertinent Vitals/Pain Pain Assessment: No/denies pain    Home Living                      Prior Function            PT Goals (current goals can now be found in the care plan section) Acute Rehab PT Goals Patient Stated Goal: to walk farther PT Goal Formulation: With patient Time For Goal Achievement: 02/02/15 Potential to Achieve Goals: Fair Progress towards PT goals: Progressing toward goals    Frequency  Min 3X/week    PT Plan      Co-evaluation             End of Session Equipment Utilized During Treatment: Gait belt;Oxygen Activity Tolerance: Patient limited by fatigue Patient left: in chair;with call bell/phone within reach;with chair alarm set     Time: 1355-1414 PT Time Calculation (min) (ACUTE ONLY): 19 min  Charges:  $Gait Training: 8-22 mins                    G Codes:      Philomena Doheny 01/21/2015, 2:37 PM [phone]

## 2015-01-21 NOTE — Clinical Social Work Placement (Signed)
CSW faxed information out to SNFs - awaiting bed offers. Will provide bed offers in the morning. Patient states he would still like to discuss with daughter & son. Daughter asking about resources at home, but patient states that he would prefer to just go to SNF.     Raynaldo Opitz, [facility] Clinical Social Worker cell #: [phone]    CLINICAL SOCIAL WORK PLACEMENT  NOTE  Date:  01/21/2015  Patient Details  Name: [name] MRN: [id number] Date of Birth: May 19, 1932  Clinical Social Work is seeking post-discharge placement for this patient at the [facility] level of care (*CSW will initial, date and re-position this form in  chart as items are completed):  Yes   Patient/family provided with [place] Work Department's list of facilities offering this level of care within the geographic area requested by the patient (or if unable, by the patient's family).  Yes   Patient/family informed of their freedom to choose among providers that offer the needed level of care, that participate in Medicare, Medicaid or managed care program needed by the patient, have an available bed and are willing to accept the patient.  Yes   Patient/family informed of [place]'s ownership interest in [facility] and [facility], as well as of the fact that they are under no obligation to receive care at these facilities.  PASRR submitted to EDS on 01/21/15     PASRR number received on 01/21/15     Existing PASRR number confirmed on       FL2 transmitted to all facilities in geographic area requested by pt/family on       FL2 transmitted to all facilities within larger geographic area on       Patient informed that his/her managed care company has contracts with or will negotiate with certain facilities, including the following:            Patient/family informed of bed offers received.  Patient chooses bed at       Physician  recommends and patient chooses bed at      Patient to be transferred to   on  .  Patient to be transferred to facility by       Patient family notified on   of transfer.  Name of family member notified:        PHYSICIAN       Additional Comment:    _______________________________________________ Standley Brooking, LCSW 01/21/2015, 3:59 PM

## 2015-01-21 NOTE — Progress Notes (Signed)
[facility] PROGRESS NOTE   [name] [id number] DOB: Jun 20, 1932 DOA: 01/18/2015 PCP: Christinia Gully, MD  HPI/Subjective: Feels much better, seen with nursing 7 bedside. I'll transfuse 1 more unit of packed RBCs. Clinical social worker consulted for SNF placement  Assessment/Plan: Principal Problem:   Sepsis due to pneumonia [facility]) Active Problems:   Lactic acidosis   Pancytopenia (HCC)   MDS (myelodysplastic syndrome) (HCC)   Lobar pneumonia, unspecified organism ([place])   Neutropenia (HCC)   Thrombocytopenia (HCC)   Anemia of chronic disease   Pacemaker   Chronic diastolic CHF (congestive heart failure) ([place])    Sepsis due to pneumonia ([place]) / Lobar pneumonia, unspecified organism ([place]) / Lactic acidosis - Sepsis criteria met with hypotension, neutropenia, lactic acidosis - Source of infection is likely pneumonia. CXR on admission showed RUL opacity - Sepsis work up initiated - Pneumonia order set placed - Follow up blood cultures, procalcitonin level, strep pneumonia, legionella results - Had 3 days off of Zosyn and vancomycin, I will switch to oral levofloxacin. - Added supportive management with bronchodilators, mucolytics, antitussives and oxygen as needed.   MDS (myelodysplastic syndrome) ([place]) / Follicular lymphoma  - Follows with Dr. [name] - Status post bone marrow biopsy on 06/22/2012.    Neutropenia ([place]) - Due to MDS - Start Neupogen - Check CBC daily    Thrombocytopenia (HCC) - Likely due to history of MDS - Platelets 63    Anemia of chronic disease - Due to history of MDS, recent blood transfusion on 11/03, 12/7 and last on 12/21.  - Hemoglobin 7.8 on admission. - Status post transfusion of 1 packed RBC, hemoglobin is 8.0, also received Lasix posttransfusion. -  hemoglobin less than 8, transfuse another unit today.   Pacemaker - Stable    Code Status: Full Code Family Communication: Plan discussed with the  patient. Disposition Plan: await SNF bed, likely to be discharged in a.m. to skilled nursing facility.  Diet: Diet regular Room service appropriate?: Yes; Fluid consistency:: Thin  Consultants:  None  Procedures:  None  Antibiotics:  None   Objective: Filed Vitals:   01/21/15 1000 01/21/15 1041  BP: 126/63 129/60  Pulse: 63 66  Temp: 97.7 F (36.5 C) 98.1 F (36.7 C)  Resp: 20 19    Intake/Output Summary (Last 24 hours) at 01/21/15 1229 Last data filed at 01/21/15 0441  Gross per 24 hour  Intake    610 ml  Output   1900 ml  Net  -1290 ml   Filed Weights   01/18/15 0921 01/18/15 1330 01/18/15 2310  Weight: 108.863 kg (240 lb) 108.7 kg (239 lb 10.2 oz) 111.1 kg (244 lb 14.9 oz)    Exam: General: Alert and awake, oriented x3, not in any acute distress. HEENT: anicteric sclera, pupils reactive to light and accommodation, EOMI CVS: S1-S2 clear, no murmur rubs or gallops Chest: clear to auscultation bilaterally, no wheezing, rales or rhonchi Abdomen: soft nontender, nondistended, normal bowel sounds, no organomegaly Extremities: no cyanosis, clubbing or edema noted bilaterally Neuro: Cranial nerves II-XII intact, no focal neurological deficits  Data Reviewed: Basic Metabolic Panel:  Recent Labs Lab 01/18/15 0940 01/19/15 0532 01/20/15 0429 01/21/15 0332  NA 137 136 136 135  K 4.4 5.0 3.9 3.5  CL 100* 101 99* 98*  CO2 _0 GLUCOSE 308* 193* 164* 168*  BUN 24* 22* 16 15  CREATININE 0.92 0.83 0.75 0.73  CALCIUM 9.5 9.2 8.8* 8.7*  MG  --  1.7  --   --  PHOS  --  3.6  --   --    Liver Function Tests:  Recent Labs Lab 01/18/15 0940 01/19/15 0532  AST 27 46*  ALT 32 49  ALKPHOS 49 67  BILITOT 1.0 0.9  PROT 6.6 6.3*  ALBUMIN 3.2* 3.0*   No results for input(s): LIPASE, AMYLASE in the last 168 hours. No results for input(s): AMMONIA in the last 168 hours. CBC:  Recent Labs Lab 01/16/15 0917 01/18/15 0940 01/19/15 0532 01/20/15 0429  01/21/15 0332  WBC 0.9* 0.9* 2.2* 2.1* 1.9*  NEUTROABS 0.5* 0.6*  --  1.7 1.6*  HGB 6.8* 7.8* 7.9* 8.0* 7.7*  HCT 20.0* 23.4* 23.1* 22.8* 21.9*  MCV 102.6* 102.6* 101.8* 99.6 99.1  PLT 65* 63* 55* 52* 53*   Cardiac Enzymes: No results for input(s): CKTOTAL, CKMB, CKMBINDEX, TROPONINI in the last 168 hours. BNP (last 3 results)  Recent Labs  01/18/15 0940  BNP 668.7*    ProBNP (last 3 results) No results for input(s): PROBNP in the last 8760 hours.  CBG: No results for input(s): GLUCAP in the last 168 hours.  Micro Recent Results (from the past 240 hour(s))  Blood Culture (routine x 2)     Status: None (Preliminary result)   Collection Time: 01/18/15  9:40 AM  Result Value Ref Range Status   Specimen Description BLOOD RIGHT PORTA CATH  Final   Special Requests BOTTLES DRAWN AEROBIC AND ANAEROBIC 10CC  Final   Culture   Final    NO GROWTH 3 DAYS Performed at [facility]    Report Status PENDING  Incomplete  Blood Culture (routine x 2)     Status: None (Preliminary result)   Collection Time: 01/18/15 10:58 AM  Result Value Ref Range Status   Specimen Description BLOOD LEFT ANTECUBITAL  Final   Special Requests BOTTLES DRAWN AEROBIC AND ANAEROBIC 10CC  Final   Culture   Final    NO GROWTH 3 DAYS Performed at [facility]    Report Status PENDING  Incomplete  Urine culture     Status: None   Collection Time: 01/18/15 12:47 PM  Result Value Ref Range Status   Specimen Description URINE, CLEAN CATCH  Final   Special Requests NONE  Final   Culture   Final    7,000 COLONIES/mL INSIGNIFICANT GROWTH Performed at [facility]    Report Status 01/20/2015 FINAL  Final  MRSA PCR Screening     Status: Abnormal   Collection Time: 01/18/15  2:23 PM  Result Value Ref Range Status   MRSA by PCR POSITIVE (A) NEGATIVE Final    Comment:        The GeneXpert MRSA Assay (FDA approved for NASAL specimens only), is one component of a comprehensive MRSA  colonization surveillance program. It is not intended to diagnose MRSA infection nor to guide or monitor treatment for MRSA infections. RESULT CALLED TO, READ BACK BY AND VERIFIED WITH: DENISE ALDRIDGE RN 12.23.16 @ 1812 BY RICEJ      Studies: No results found.  Scheduled Meds: . calcium-vitamin D  1 tablet Oral BID  . ceFEPime (MAXIPIME) IV  2 g Intravenous 3 times per day  . Chlorhexidine Gluconate Cloth  6 each Topical Q0600  . cholecalciferol  1,000 Units Oral BID  . famotidine  20 mg Oral QHS  . finasteride  5 mg Oral q morning - 10a  . fluticasone  1 spray Each Nare Daily  . furosemide  40 mg Intravenous Once  .  guaiFENesin  1,200 mg Oral BID  . levothyroxine  25 mcg Oral QAC breakfast  . metFORMIN  500 mg Oral BID WC  . mometasone-formoterol  2 puff Inhalation BID  . multivitamin  1 tablet Oral Daily  . mupirocin ointment   Nasal BID  . oxybutynin  10 mg Oral BH-q7a  . pantoprazole  40 mg Oral Daily  . polyethylene glycol  17 g Oral Daily  . sodium chloride  10-40 mL Intracatheter Q12H  . traZODone  100 mg Oral QHS  . vancomycin  1,000 mg Intravenous Q12H   Continuous Infusions:      Time spent: 35 minutes    [facility]  [facility] Pager [phone] If 7PM-7AM, please contact night-coverage at [email or web address], password [facility] 01/21/2015, 12:29 PM  LOS: 3 days

## 2015-01-22 LAB — PATHOLOGIST SMEAR REVIEW

## 2015-01-22 LAB — TYPE AND SCREEN
ABO/RH(D): A POS
ANTIBODY SCREEN: NEGATIVE
Unit division: 0
Unit division: 0

## 2015-01-22 LAB — BASIC METABOLIC PANEL
ANION GAP: 11 (ref 5–15)
BUN: 14 mg/dL (ref 6–20)
CHLORIDE: 98 mmol/L — AB (ref 101–111)
CO2: 24 mmol/L (ref 22–32)
Calcium: 8.9 mg/dL (ref 8.9–10.3)
Creatinine, Ser: 0.79 mg/dL (ref 0.61–1.24)
GFR calc Af Amer: >60 mL/min (ref >60)
Glucose, Bld: 149 mg/dL — ABNORMAL HIGH (ref 65–99)
POTASSIUM: 3.5 mmol/L (ref 3.5–5.1)
SODIUM: 133 mmol/L — AB (ref 135–145)

## 2015-01-22 LAB — CBC
HEMATOCRIT: 24.7 % — AB (ref 39.0–52.0)
HEMOGLOBIN: 8.7 g/dL — AB (ref 13.0–17.0)
MCH: 34.4 pg — ABNORMAL HIGH (ref 26.0–34.0)
MCHC: 35.2 g/dL (ref 30.0–36.0)
MCV: 97.6 fL (ref 78.0–100.0)
Platelets: 55 10*3/uL — ABNORMAL LOW (ref 150–400)
RBC: 2.53 MIL/uL — AB (ref 4.22–5.81)
RDW: 21.2 % — ABNORMAL HIGH (ref 11.5–15.5)
WBC: 1.6 10*3/uL — AB (ref 4.0–10.5)

## 2015-01-22 MED ORDER — HEPARIN SOD (PORK) LOCK FLUSH 100 UNIT/ML IV SOLN
500.0000 [IU] | INTRAVENOUS | Status: AC | PRN
  Administered 2015-01-22: 500 [IU]

## 2015-01-22 MED ORDER — LEVOFLOXACIN 750 MG PO TABS: 750.0000 mg | ORAL_TABLET | Freq: Every day | ORAL | Status: DC

## 2015-01-22 MED ORDER — GUAIFENESIN-DM 100-10 MG/5ML PO SYRP
5.0000 mL | ORAL_SOLUTION | ORAL | Status: DC | PRN
Start: 2015-01-22 — End: 2015-07-03

## 2015-01-22 NOTE — Progress Notes (Signed)
Date: January 22, 2015 Chart reviewed for concurrent status and case management needs. Will continue to follow patient for changes and needs:  TCF DR. Elmahi This is not his patient. Velva Harman, RN, BSN, [place]   [phone]

## 2015-01-22 NOTE — Progress Notes (Signed)
Date: January 22, 2015 Chart reviewed for concurrent status and case management needs. Will continue to follow patient for changes and needs:  Dr. Hartford Poli notified of patient wishes to go home with hhc and not to a snf. Velva Harman, RN, BSN, [place]   [phone]

## 2015-01-22 NOTE — Discharge Summary (Signed)
Physician Discharge Summary  [name] [id number] DOB: 06/09/1932 DOA: 01/18/2015  PCP: Christinia Gully, MD  Admit date: 01/18/2015 Discharge date: 01/22/2015  Time spent: 35 minutes  Recommendations for Outpatient Follow-up:  1. Discharge home with home health RN and PT. Follow-up with PCP in one week 2. Patient will complete 7 days of antibiotics on 01/25/2015   Discharge Diagnoses:  Principal Problem:   Sepsis due to pneumonia [facility])   Active Problems:   Lobar pneumonia, unspecified organism ([place])   Lactic acidosis   Pancytopenia (HCC)   MDS (myelodysplastic syndrome) (HCC)   Neutropenia (HCC)   Thrombocytopenia (HCC)   Anemia of chronic disease   Pacemaker   Chronic diastolic CHF (congestive heart failure) ([place])   Discharge Condition: fair  Diet recommendation: regular  Filed Weights   01/18/15 0921 01/18/15 1330 01/18/15 2310  Weight: 108.863 kg (240 lb) 108.7 kg (239 lb 10.2 oz) 111.1 kg (244 lb 14.9 oz)    History of present illness:  Please refer to admission H&P for details, in brief,  81 year old male with history of follicular lymphoma status post CHOP and rituximab with  complete response to therapy concluded in 2007, bone marrow biopsy in 05/2012 suggestive of early myelodysplasia,last visit in 11/2014 with Dr. [name], getting biweekly aranesp and intermittent PRBC transfusion for low hemoglobin. He was just seen in ED 01/14/15 status post fall and x rays done were unremarkable and thus was discharged home.[name] However since then he did not feel well.. He had some cough but no fevers. No chest pain, abdominal pain,  nausea or vomiting. No diarrhea or constipation. No blood in stool or urine. In the ED he had low blood pressure. Blood work showed WBC count of 0.9, hemoglobin of 7.8, platelets 63, normal Cr. Lactic acid was 3.27. CT head showed no acute intracranial findings. CXR showed right upper lung lobe opacity. Patient met criteria for sepsis. He was  started on vanco and zosyn and admitted to SDU.   Hospital Course:  Sepsis secondary to right lobar pneumonia Sepsis pathway initiated in the ED. Placed on empiric vancomycin and Zosyn. Sepsis resolved and patient transitioned to Levaquin. Supportive care given with bronchodilators, mucolytics and as needed oxygen. Blood cultures, strep pneumonia and legionella antigen negative. -Patient remains afebrile and WBC improved. I will discharge him on oral Levaquin to complete a total seven-day course of antibiotics.  Myelodysplastic syndrome/follicular lymphoma Follows with Dr. [name]. has pancytopenia. Received Neupogen while in the hospital. Also received 2 unit PRBC for hemoglobin <8 while in the hospital. His hemoglobin is 8.7 upon discharge. Receives Aranesp injection every 2 weeks. Follow-up with his oncologist.   Type 2 diabetes mellitus Stable. Continue metformin.  Essential hypertension Stable. Continue home medications.  GERD Continue ranitidine and omeprazole  Hypothyroidism Continue Synthroid  Generalized deconditioning Seen by PT and recommended skilled nursing facility however patient and his daughter wished to be discharged home. Patient lives alone and has home health. His daughter lives nearby and checks on him twice a day and for him. She is trying to get a caregiver to stay with him for longer hours during the day. Patient is safe to be discharged home. Order for home health RN and PT.          Procedures:  CT head  Consultations:  None  Discharge Exam: Filed Vitals:   01/21/15 2025 01/22/15 0421  BP: 110/70 115/75  Pulse: 60 65  Temp: 98 F (36.7 C) 98 F (36.7 C)  Resp: 20  20    General: Elderly male not in distress, fatigue HEENT: Pallor present, moist mucosa, supple neck Chest: Clear to auscultation bilaterally, right-sided Port-A-Cath CVS: Normal S1 and S2, no murmurs rubs or gallop GI: Soft, nondistended, nontender, bowel sounds  present Skeletal skeletal: Warm, no edema CNS: Alert and oriented   Discharge Instructions    Current Discharge Medication List    START taking these medications   Details  guaiFENesin-dextromethorphan (ROBITUSSIN DM) 100-10 MG/5ML syrup Take 5 mLs by mouth every 4 (four) hours as needed for cough. Qty: 118 mL, Refills: 0    levofloxacin (LEVAQUIN) 750 MG tablet Take 1 tablet (750 mg total) by mouth daily. Qty: 3 tablet, Refills: 0      CONTINUE these medications which have NOT CHANGED   Details  acetaminophen (TYLENOL) 500 MG tablet Take 500 mg by mouth every 6 (six) hours as needed for mild pain or headache.    albuterol (PROVENTIL HFA;VENTOLIN HFA) 108 (90 BASE) MCG/ACT inhaler Inhale 1-2 puffs into the lungs every 4 (four) hours as needed for wheezing or shortness of breath. Qty: 8 g, Refills: 5    aspirin 81 MG tablet Take 81 mg by mouth every morning. (hold if bleeding)    calcium-vitamin D (OS-CAL 500 + D) 500-200 MG-UNIT per tablet Take 1 tablet by mouth 2 (two) times daily.     Cholecalciferol (VITAMIN D) 1000 UNITS capsule Take 1,000 Units by mouth 2 (two) times daily.      docusate sodium (COLACE) 100 MG capsule Take 100 mg by mouth daily as needed for moderate constipation.     DULERA 200-5 MCG/ACT AERO INHALE 2 PUFFS FIRST THING EVERY MORNING AND THEN ANOTHER 2 PUFFS ABOUT 12 HOURS LATER Qty: 1 Inhaler, Refills: 5    famotidine (PEPCID) 20 MG tablet Take 20 mg by mouth at bedtime.     finasteride (PROSCAR) 5 MG tablet Take 5 mg by mouth every morning.     furosemide (LASIX) 40 MG tablet TAKE 1 TABLET BY MOUTH DAILY. MAY TAKE 1 EXTRA TABLET DAILY IF NEEDED FOR SWELLING IN LEGS Qty: 30 tablet, Refills: 5    KLOR-CON M20 20 MEQ tablet TAKE 1 TABLET BY MOUTH EVERY DAY Qty: 30 tablet, Refills: 5    levothyroxine (SYNTHROID, LEVOTHROID) 25 MCG tablet TAKE 1 TABLET BY MOUTH EVERY MORNING BEFORE BREAKFAST Qty: 30 tablet, Refills: 3    lidocaine-prilocaine  (EMLA) cream Apply 1 application topically as needed. Qty: 30 g, Refills: 2    metFORMIN (GLUCOPHAGE) 500 MG tablet TAKE 1 TABLET BY MOUTH TWICE A DAY WITH FOOD Qty: 60 tablet, Refills: 5    Multiple Vitamin (MULTIVITAMIN) tablet Take 1 tablet by mouth daily.      NASONEX 50 MCG/ACT nasal spray USE 2 SPRAYS NASALLY TWICE DAILY Qty: 17 g, Refills: 11    nitroGLYCERIN (NITROSTAT) 0.4 MG SL tablet Place 1 tablet (0.4 mg total) under the tongue every 5 (five) minutes as needed for chest pain. Qty: 90 tablet, Refills: 3    omeprazole (PRILOSEC) 20 MG capsule Take 40 mg by mouth daily before breakfast.     oxybutynin (DITROPAN-XL) 10 MG 24 hr tablet Take 10 mg by mouth every morning.     sulindac (CLINORIL) 200 MG tablet TAKE 1 TABLET BY MOUTH TWICE A DAY Qty: 60 tablet, Refills: 5    traMADol (ULTRAM) 50 MG tablet Take 1/2 to one  tablet by mouth every 4 hours as needed for severe pain or cough Qty: 40 tablet, Refills:  0    traZODone (DESYREL) 150 MG tablet 2/3 to 1 tablet at bedtime Qty: 30 tablet, Refills: 5       No Known Allergies Follow-up Information    Follow up with Christinia Gully, MD. Schedule an appointment as soon as possible for a visit in 1 week.   Specialty:  Pulmonary Disease   Contact information:   [address] [place] [address] [phone]        The results of significant diagnostics from this hospitalization (including imaging, microbiology, ancillary and laboratory) are listed below for reference.    Significant Diagnostic Studies: Dg Ribs Unilateral W/chest Left  01/13/2015  CLINICAL DATA:  Nausea, lightheadedness, low back pain and left costal pain after unwitnessed fall this evening. EXAM: LEFT RIBS AND CHEST - 3+ VIEW COMPARISON:  Chest 07/18/2014 FINDINGS: Postoperative changes in the mediastinum. Cardiac pacemaker. Power port type central venous catheter with tip over the low SVC region. No pneumothorax. Cardiac enlargement without vascular  congestion. No focal airspace disease or consolidation in the lungs. No blunting of costophrenic angles. No pneumothorax. Probable esophageal hiatal hernia behind the heart. Left ribs appear intact. No acute displaced fractures or focal bone lesions identified. IMPRESSION: Cardiac enlargement. No evidence of active pulmonary disease. Negative left ribs. Electronically Signed   By: Lucienne Capers M.D.   On: 01/13/2015 06:29   Ct Head Wo Contrast  01/18/2015  CLINICAL DATA:  81 year old male with generalized weakness, confusion, and fall 5 days ago. Initial encounter. EXAM: CT HEAD WITHOUT CONTRAST TECHNIQUE: Contiguous axial images were obtained from the base of the skull through the vertex without intravenous contrast. COMPARISON:  Head CT 10/25/2014 and earlier. FINDINGS: Chronic posterior right ethmoid sinus opacification is stable. Other Visualized paranasal sinuses and mastoids are clear. No acute scalp or orbits soft tissue finding. No acute osseous abnormality identified. Dystrophic calcification along the posterior right thalamus and midbrain re- demonstrated and unchanged since 2007. This might be the sequelae of remote hemorrhage or infection, uncertain. Stable cerebral volume. Stable ventricle size and configuration. No ventriculomegaly. Mild cerebral white matter hypodensity is stable. No cortically based acute infarct identified. No suspicious intracranial vascular hyperdensity. No acute intracranial hemorrhage identified. IMPRESSION: No acute intracranial abnormality and stable noncontrast CT appearance of the brain. Chronic dystrophic calcification in the posterior right thalamus and midbrain. Electronically Signed   By: Genevie Ann M.D.   On: 01/18/2015 10:50   Dg Abd Acute W/chest  01/18/2015  CLINICAL DATA:  81 year old male with lower abdominal pain today and shortness of breath. Generalized weakness and confusion. Diabetes. Initial encounter. Current history of myelodysplastic syndrome.  EXAM: DG ABDOMEN ACUTE W/ 1V CHEST COMPARISON:  01/13/2015 chest exam. CT Abdomen and Pelvis 10/28/2012. FINDINGS: Chronic right chest cardiac pacemaker. Right chest porta cath re- demonstrated. Stable cardiomegaly and mediastinal contours. Mildly increased opacity at the right lung base, favor small pleural effusion. Mildly increased pulmonary vascularity without overt edema. There is vague new increased right apical opacity (arrow). Left lung appear stable and clear. No pneumothorax or pneumoperitoneum. Non obstructed bowel gas pattern. Retained stool in the colon. Stable abdominal and pelvic visceral contours. Osteopenia. Chronic scoliosis. No acute osseous abnormality identified. Left inguinal surgical clips. IMPRESSION: 1. New vague right upper lobe/apical pulmonary opacity since 01/13/2015 with suspected new small right pleural effusion. Query pneumonia. 2.  Normal bowel gas pattern, no free air. Electronically Signed   By: Genevie Ann M.D.   On: 01/18/2015 11:02    Microbiology: Recent Results (from  the past 240 hour(s))  Blood Culture (routine x 2)     Status: None (Preliminary result)   Collection Time: 01/18/15  9:40 AM  Result Value Ref Range Status   Specimen Description BLOOD RIGHT PORTA CATH  Final   Special Requests BOTTLES DRAWN AEROBIC AND ANAEROBIC 10CC  Final   Culture   Final    NO GROWTH 4 DAYS Performed at [facility]    Report Status PENDING  Incomplete  Blood Culture (routine x 2)     Status: None (Preliminary result)   Collection Time: 01/18/15 10:58 AM  Result Value Ref Range Status   Specimen Description BLOOD LEFT ANTECUBITAL  Final   Special Requests BOTTLES DRAWN AEROBIC AND ANAEROBIC 10CC  Final   Culture   Final    NO GROWTH 4 DAYS Performed at [facility]    Report Status PENDING  Incomplete  Urine culture     Status: None   Collection Time: 01/18/15 12:47 PM  Result Value Ref Range Status   Specimen Description URINE, CLEAN CATCH  Final    Special Requests NONE  Final   Culture   Final    7,000 COLONIES/mL INSIGNIFICANT GROWTH Performed at [facility]    Report Status 01/20/2015 FINAL  Final  MRSA PCR Screening     Status: Abnormal   Collection Time: 01/18/15  2:23 PM  Result Value Ref Range Status   MRSA by PCR POSITIVE (A) NEGATIVE Final    Comment:        The GeneXpert MRSA Assay (FDA approved for NASAL specimens only), is one component of a comprehensive MRSA colonization surveillance program. It is not intended to diagnose MRSA infection nor to guide or monitor treatment for MRSA infections. RESULT CALLED TO, READ BACK BY AND VERIFIED WITH: DENISE ALDRIDGE RN 12.23.16 @ 1812 BY RICEJ      Labs: Basic Metabolic Panel:  Recent Labs Lab 01/18/15 0940 01/19/15 0532 01/20/15 0429 01/21/15 0332 01/22/15 0455  NA 137 136 136 135 133*  K 4.4 5.0 3.9 3.5 3.5  CL 100* 101 99* 98* 98*  CO2 25 24 27 26 24   GLUCOSE 308* 193* 164* 168* 149*  BUN 24* 22* 16 15 14   CREATININE 0.92 0.83 0.75 0.73 0.79  CALCIUM 9.5 9.2 8.8* 8.7* 8.9  MG  --  1.7  --   --   --   PHOS  --  3.6  --   --   --    Liver Function Tests:  Recent Labs Lab 01/18/15 0940 01/19/15 0532  AST 27 46*  ALT 32 49  ALKPHOS 49 67  BILITOT 1.0 0.9  PROT 6.6 6.3*  ALBUMIN 3.2* 3.0*   No results for input(s): LIPASE, AMYLASE in the last 168 hours. No results for input(s): AMMONIA in the last 168 hours. CBC:  Recent Labs Lab 01/16/15 0917 01/18/15 0940 01/19/15 0532 01/20/15 0429 01/21/15 0332 01/22/15 0455  WBC 0.9* 0.9* 2.2* 2.1* 1.9* 1.6*  NEUTROABS 0.5* 0.6*  --  1.7 1.6*  --   HGB 6.8* 7.8* 7.9* 8.0* 7.7* 8.7*  HCT 20.0* 23.4* 23.1* 22.8* 21.9* 24.7*  MCV 102.6* 102.6* 101.8* 99.6 99.1 97.6  PLT 65* 63* 55* 52* 53* 55*   Cardiac Enzymes: No results for input(s): CKTOTAL, CKMB, CKMBINDEX, TROPONINI in the last 168 hours. BNP: BNP (last 3 results)  Recent Labs  01/18/15 0940  BNP 668.7*    ProBNP (last 3  results) No results for input(s): PROBNP  in the last 8760 hours.  CBG: No results for input(s): GLUCAP in the last 168 hours.     Signed:  Louellen Molder MD    [facility] 01/22/2015, 10:14 AM

## 2015-01-22 NOTE — Progress Notes (Signed)
CSW received call from RN, Janett Billow that patient has now decided to return home with home health services at discharge. CSW sent message to [facility] (ph#: [phone]) to make aware.   No further CSW needs identified - CSW signing off.   Raynaldo Opitz, [facility] Clinical Social Worker cell #: [phone]

## 2015-01-22 NOTE — Discharge Instructions (Signed)

## 2015-01-22 NOTE — Progress Notes (Signed)
[id number]/ALERT/ [place] WILL DO RN AND PT FOR HOME CARE/Valerio Pinard,RN,BSN,CCM [id number]

## 2015-01-23 ENCOUNTER — Encounter: Payer: Self-pay | Admitting: *Deleted

## 2015-01-23 LAB — CULTURE, BLOOD (ROUTINE X 2)
CULTURE: NO GROWTH
Culture: NO GROWTH

## 2015-01-24 ENCOUNTER — Other Ambulatory Visit: Payer: Self-pay

## 2015-01-24 ENCOUNTER — Emergency Department (HOSPITAL_COMMUNITY): Payer: Medicare Other

## 2015-01-24 ENCOUNTER — Emergency Department (HOSPITAL_COMMUNITY)
Admission: EM | Admit: 2015-01-24 | Discharge: 2015-01-24 | Disposition: A | Discharge status: Home / Self Care | Payer: Medicare Other | Attending: Emergency Medicine | Admitting: Emergency Medicine

## 2015-01-24 ENCOUNTER — Telehealth: Payer: Self-pay | Admitting: *Deleted

## 2015-01-24 DIAGNOSIS — Z7982 Long term (current) use of aspirin: Secondary | ICD-10-CM | POA: Insufficient documentation

## 2015-01-24 DIAGNOSIS — Z9889 Other specified postprocedural states: Secondary | ICD-10-CM | POA: Diagnosis not present

## 2015-01-24 DIAGNOSIS — J181 Lobar pneumonia, unspecified organism: Secondary | ICD-10-CM | POA: Diagnosis not present

## 2015-01-24 DIAGNOSIS — R0602 Shortness of breath: Secondary | ICD-10-CM

## 2015-01-24 DIAGNOSIS — Z79899 Other long term (current) drug therapy: Secondary | ICD-10-CM | POA: Insufficient documentation

## 2015-01-24 DIAGNOSIS — Z8679 Personal history of other diseases of the circulatory system: Secondary | ICD-10-CM | POA: Diagnosis not present

## 2015-01-24 DIAGNOSIS — Z87891 Personal history of nicotine dependence: Secondary | ICD-10-CM | POA: Insufficient documentation

## 2015-01-24 LAB — COMPREHENSIVE METABOLIC PANEL
ALT: 50 U/L (ref 17–63)
AST: 31 U/L (ref 15–41)
Albumin: 3.4 g/dL — ABNORMAL LOW (ref 3.5–5.0)
Alkaline Phosphatase: 77 U/L (ref 38–126)
Anion gap: 12 (ref 5–15)
BILIRUBIN TOTAL: 0.6 mg/dL (ref 0.3–1.2)
BUN: 13 mg/dL (ref 6–20)
CHLORIDE: 95 mmol/L — AB (ref 101–111)
CO2: 28 mmol/L (ref 22–32)
CREATININE: 0.83 mg/dL (ref 0.61–1.24)
Calcium: 9.2 mg/dL (ref 8.9–10.3)
Glucose, Bld: 135 mg/dL — ABNORMAL HIGH (ref 65–99)
Potassium: 3.5 mmol/L (ref 3.5–5.1)
Sodium: 135 mmol/L (ref 135–145)
TOTAL PROTEIN: 6.9 g/dL (ref 6.5–8.1)

## 2015-01-24 LAB — URINALYSIS, ROUTINE W REFLEX MICROSCOPIC
BILIRUBIN URINE: NEGATIVE
Glucose, UA: NEGATIVE mg/dL
Hgb urine dipstick: NEGATIVE
KETONES UR: NEGATIVE mg/dL
LEUKOCYTES UA: NEGATIVE
NITRITE: NEGATIVE
PROTEIN: NEGATIVE mg/dL
Specific Gravity, Urine: 1.005 (ref 1.005–1.030)
pH: 6 (ref 5.0–8.0)

## 2015-01-24 LAB — CBC WITH DIFFERENTIAL/PLATELET
BASOS ABS: 0 10*3/uL (ref 0.0–0.1)
BASOS PCT: 0 %
Eosinophils Absolute: 0 10*3/uL (ref 0.0–0.7)
Eosinophils Relative: 1 %
HCT: 27.1 % — ABNORMAL LOW (ref 39.0–52.0)
Hemoglobin: 9.4 g/dL — ABNORMAL LOW (ref 13.0–17.0)
Lymphocytes Relative: 30 %
Lymphs Abs: 0.5 10*3/uL — ABNORMAL LOW (ref 0.7–4.0)
MCH: 34.1 pg — ABNORMAL HIGH (ref 26.0–34.0)
MCHC: 34.7 g/dL (ref 30.0–36.0)
MCV: 98.2 fL (ref 78.0–100.0)
MONOS PCT: 0 %
Monocytes Absolute: 0 10*3/uL — ABNORMAL LOW (ref 0.1–1.0)
NEUTROS PCT: 69 %
Neutro Abs: 1.3 10*3/uL — ABNORMAL LOW (ref 1.7–7.7)
PLATELETS: 71 10*3/uL — AB (ref 150–400)
RBC: 2.76 MIL/uL — AB (ref 4.22–5.81)
RDW: 21 % — AB (ref 11.5–15.5)
WBC: 1.8 10*3/uL — AB (ref 4.0–10.5)
nRBC: 1 /100 WBC — ABNORMAL HIGH

## 2015-01-24 LAB — I-STAT CG4 LACTIC ACID, ED: Lactic Acid, Venous: 3.1 mmol/L (ref 0.5–2.0)

## 2015-01-24 MED ORDER — HEPARIN SOD (PORK) LOCK FLUSH 100 UNIT/ML IV SOLN
500.0000 [IU] | Freq: Once | INTRAVENOUS | Status: AC
  Administered 2015-01-24: 500 [IU]
  Filled 2015-01-24: qty 5

## 2015-01-24 MED ORDER — LEVOFLOXACIN 750 MG PO TABS: 750.0000 mg | ORAL_TABLET | Freq: Every day | ORAL | Status: AC

## 2015-01-24 MED ORDER — ALPRAZOLAM 0.25 MG PO TABS: 0.2500 mg | ORAL_TABLET | Freq: Every evening | ORAL | Status: DC | PRN

## 2015-01-24 NOTE — ED Provider Notes (Signed)
CSN: [id number]     Arrival date & time 01/24/15  1103 History   First MD Initiated Contact with Patient 01/24/15 1139     Chief Complaint  Patient presents with  . Shortness of Breath     HPI Pt and daughter report pt got out of hospital on Tuesday, was treated for PNA. Has condition MDS and stays neutropenic, is treated at cancer center, gets blood transfusions at the cancer center. Pt has becoming fatigued and SOB in last 2 days. Daughter states these are signs pt needs blood transfusion. usually they transfuse when hemoglobin is 9. Daughter called cancer center today but they havd no openings and the sickle cell center has no openings. Pt denies pain.  Past Medical History  Diagnosis Date  . Second degree Mobitz II AV block 12/11/08    with transient syncope, resolved s/p PPM  . Paroxysmal atrial fibrillation ([place]) 03/16/11    diagnosed by PPM interrogation   Past Surgical History  Procedure Laterality Date  . Lymph node biopsy  04/2005    groin  . Sternotomy  2000  . Pericardiectomy  2000  . Penile prosthesis implant      and removal  . Pacemaker insertion  12/11/08    MDT implanted by Dr Rayann Heman  . Tee without cardioversion N/A 10/31/2012    Procedure: TRANSESOPHAGEAL ECHOCARDIOGRAM (TEE);  Surgeon: Lelon Perla, MD;  Location: [facility] ENDOSCOPY;  Service: Cardiovascular;  Laterality: N/A;  . Pacemaker lead removal Left 11/04/2012    Procedure: PACEMAKER LEAD REMOVAL;  Surgeon: Evans Lance, MD;  Location: [place];  Service: Cardiovascular;  Laterality: Left;  . Temporary pacemaker insertion Left 11/04/2012    Procedure: TEMPORARY PACEMAKER INSERTION;  Surgeon: Evans Lance, MD;  Location: [place];  Service: Cardiovascular;  Laterality: Left;  . Insert / replace / remove pacemaker  12/29/2012  . Bi-ventricular pacemaker upgrade N/A 12/29/2012    Procedure: BI-VENTRICULAR PACEMAKER UPGRADE;  Surgeon: Evans Lance, MD;  Location: [facility] CATH LAB;  Service: Cardiovascular;   Laterality: N/A;   Family History  Problem Relation Age of Onset  . Diabetes Mother   . Liver cancer Brother   . Cancer Sister    Social History  Substance Use Topics  . Smoking status: Former Smoker -- 1.00 packs/day for 30 years    Types: Cigarettes    Quit date: 01/27/1968  . Smokeless tobacco: Former Systems developer    Types: Chew  . Alcohol Use: No    Review of Systems  All other systems reviewed and are negative  Allergies  Review of patient's allergies indicates no known allergies.  Home Medications   Prior to Admission medications   Medication Sig Start Date End Date Taking? Authorizing Provider  acetaminophen (TYLENOL) 500 MG tablet Take 500 mg by mouth every 6 (six) hours as needed for mild pain or headache.    Historical Provider, MD  albuterol (PROVENTIL HFA;VENTOLIN HFA) 108 (90 BASE) MCG/ACT inhaler Inhale 1-2 puffs into the lungs every 4 (four) hours as needed for wheezing or shortness of breath. Patient taking differently: Inhale 2 puffs into the lungs every 4 (four) hours as needed for wheezing or shortness of breath.  02/20/14   Tammy S Parrett, NP  ALPRAZolam (XANAX) 0.25 MG tablet Take 1 tablet (0.25 mg total) by mouth at bedtime as needed for anxiety. 01/24/15   Leonard Schwartz, MD  aspirin 81 MG tablet Take 81 mg by mouth every morning. (hold if bleeding)    Historical Provider,  MD  calcium-vitamin D (OS-CAL 500 + D) 500-200 MG-UNIT per tablet Take 1 tablet by mouth 2 (two) times daily.     Historical Provider, MD  Cholecalciferol (VITAMIN D) 1000 UNITS capsule Take 1,000 Units by mouth 2 (two) times daily.      Historical Provider, MD  docusate sodium (COLACE) 100 MG capsule Take 100 mg by mouth daily as needed for moderate constipation.     Historical Provider, MD  DULERA 200-5 MCG/ACT AERO INHALE 2 PUFFS FIRST THING EVERY MORNING AND THEN ANOTHER 2 PUFFS ABOUT 12 HOURS LATER 09/28/14   Tanda Rockers, MD  famotidine (PEPCID) 20 MG tablet Take 20 mg by mouth at bedtime.      Historical Provider, MD  finasteride (PROSCAR) 5 MG tablet Take 5 mg by mouth every morning.     Historical Provider, MD  furosemide (LASIX) 40 MG tablet TAKE 1 TABLET BY MOUTH DAILY. MAY TAKE 1 EXTRA TABLET DAILY IF NEEDED FOR SWELLING IN LEGS 10/26/14   Tanda Rockers, MD  guaiFENesin-dextromethorphan [facility] DM) 100-10 MG/5ML syrup Take 5 mLs by mouth every 4 (four) hours as needed for cough. 01/22/15   Nishant Dhungel, MD  KLOR-CON M20 20 MEQ tablet TAKE 1 TABLET BY MOUTH EVERY DAY 01/09/15   Tanda Rockers, MD  levofloxacin (LEVAQUIN) 750 MG tablet Take 1 tablet (750 mg total) by mouth daily. 01/24/15 01/26/15  Leonard Schwartz, MD  levothyroxine (SYNTHROID, LEVOTHROID) 25 MCG tablet TAKE 1 TABLET BY MOUTH EVERY MORNING BEFORE BREAKFAST 09/13/14   Tanda Rockers, MD  lidocaine-prilocaine (EMLA) cream Apply 1 application topically as needed. Patient taking differently: Apply 1 application topically as needed (port access).  12/05/14   Wyatt Portela, MD  metFORMIN (GLUCOPHAGE) 500 MG tablet TAKE 1 TABLET BY MOUTH TWICE A DAY WITH FOOD Patient taking differently: TAKE 1/2 TABLET BY MOUTH TWICE A DAY AFTER MEALS 02/23/14   Tanda Rockers, MD  Multiple Vitamin (MULTIVITAMIN) tablet Take 1 tablet by mouth daily.      Historical Provider, MD  NASONEX 50 MCG/ACT nasal spray USE 2 SPRAYS NASALLY TWICE DAILY 10/18/14   Tanda Rockers, MD  nitroGLYCERIN (NITROSTAT) 0.4 MG SL tablet Place 1 tablet (0.4 mg total) under the tongue every 5 (five) minutes as needed for chest pain. 06/04/14   Thompson Grayer, MD  omeprazole (PRILOSEC) 20 MG capsule Take 40 mg by mouth daily before breakfast.     Historical Provider, MD  oxybutynin (DITROPAN-XL) 10 MG 24 hr tablet Take 10 mg by mouth every morning.     Historical Provider, MD  sulindac (CLINORIL) 200 MG tablet TAKE 1 TABLET BY MOUTH TWICE A DAY Patient taking differently: TAKE 1 TABLET BY MOUTH TWICE A DAY as needed for pain 08/22/14   Tanda Rockers, MD  traMADol  Veatrice Bourbon) 50 MG tablet Take 1/2 to one  tablet by mouth every 4 hours as needed for severe pain or cough 07/18/14   Tanda Rockers, MD  traZODone (DESYREL) 150 MG tablet 2/3 to 1 tablet at bedtime Patient taking differently: Take 100 mg by mouth at bedtime.  01/02/15   Tanda Rockers, MD   BP 129/62 mmHg  Pulse 64  Temp(Src) 98.1 F (36.7 C) (Oral)  Resp 21  SpO2 98% Physical Exam Physical Exam  Nursing note and vitals reviewed. Constitutional: He is oriented to person, place, and time. He appears well-developed and well-nourished. No distress.  HENT:  Head: Normocephalic and atraumatic.  Eyes: Pupils are  equal, round, and reactive to light.  Neck: Normal range of motion.  Cardiovascular: Normal rate and intact distal pulses.   Pulmonary/Chest: No respiratory distress.  Abdominal: Normal appearance. He exhibits no distension.  Musculoskeletal: Normal range of motion.  Neurological: He is alert and oriented to person, place, and time. No cranial nerve deficit.  Skin: Skin is warm and dry. No rash noted.  Psychiatric: He has a normal mood and affect. His behavior is normal.   ED Course  Procedures (including critical care time) Labs Review Labs Reviewed  COMPREHENSIVE METABOLIC PANEL - Abnormal; Notable for the following:    Chloride 95 (*)    Glucose, Bld 135 (*)    Albumin 3.4 (*)    All other components within normal limits  CBC WITH DIFFERENTIAL/PLATELET - Abnormal; Notable for the following:    WBC 1.8 (*)    RBC 2.76 (*)    Hemoglobin 9.4 (*)    HCT 27.1 (*)    MCH 34.1 (*)    RDW 21.0 (*)    Platelets 71 (*)    Neutro Abs 1.3 (*)    Lymphs Abs 0.5 (*)    Monocytes Absolute 0.0 (*)    nRBC 1 (*)    All other components within normal limits  I-STAT CG4 LACTIC ACID, ED - Abnormal; Notable for the following:    Lactic Acid, Venous 3.10 (*)    All other components within normal limits  URINALYSIS, ROUTINE W REFLEX MICROSCOPIC (NOT AT [facility])  I-STAT CG4 LACTIC ACID, ED     Imaging Review Dg Chest 2 View  01/24/2015  CLINICAL DATA:  Shortness of breath and fatigue for 2 days. History of paroxysmal atrial fibrillation EXAM: CHEST  2 VIEW COMPARISON:  January 18, 2015 and January 13, 2015 FINDINGS: The subtle increased opacity in the right apical region remains, concerning for focal infiltrate in this area. Lungs elsewhere are clear. Heart is enlarged with pulmonary vascularity within normal limits. Patient is status post median sternotomy. Pacemaker leads are attached to the right atrium and right ventricle. No adenopathy. No pneumothorax. No bone lesions. IMPRESSION: Persistent subtle increased opacity in the right apex region concerning for a rather subtle pneumonia in this area. Followup PA and lateral chest radiographs recommended in 3-4 weeks following trial of antibiotic therapy to ensure resolution and exclude underlying malignancy. No new opacity identified. Stable cardiomegaly. Electronically Signed   By: Lowella Grip III M.D.   On: 01/24/2015 12:45   I have personally reviewed and evaluated these images and lab results as part of my medical decision-making.  Patients O2 sat is running between 96 and 99% on room air.  Patient's alert and in no acute distress.  Breath sounds are equal with no difficulty breathing.  Heart rate is in the high 60s to low 70s.  Patient appears stable and able to go home.  Discussed with family and they're okay with this.  Will add 3 more days of antibiotics and low-dose Xanax for anxiety and sleep.  MDM   Final diagnoses:  Shortness of breath  Lobar pneumonia, unspecified organism [facility])        Leonard Schwartz, MD 01/24/15 1415

## 2015-01-24 NOTE — ED Notes (Addendum)
Pt and daughter report pt got out of hospital on Tuesday, was treated for PNA. Has condition MDS and stays neutropenic, is treated at cancer center, gets blood transfusions at the cancer center. Pt has becoming fatigued and SOB in last 2 days. Daughter states these are signs pt needs blood transfusion. usually they transfuse when hemoglobin is 9.  Daughter called cancer center today but they havd no openings and the sickle cell center has no openings. Pt denies pain.

## 2015-01-24 NOTE — Discharge Instructions (Signed)

## 2015-01-24 NOTE — Telephone Encounter (Signed)
VM message received @ 8:11 am from pt's daughter stating that her father is weak, very fatigued and short of breath. She is asking if he can have CBC done here and then be transfused. She feels his HGB has dropped again. Re viewed chart. Pt discharged from hospital on 01/22/15 s/p pneumonia and was transfused during hospital stay. Last HGB on 01/22/15 was 8.7.  Dr. Hazeline Junker schedule and [facility] schedule are both full. Infusion room and [facility] are also full with no openings for transfusions. Spoke with Erline Levine, RN with Dr. [name] and agreed that pt will need to be evaluated in the ED.  TC back to [place], pt's daughter. Explained situation to her, and though she really wanted labs done here @ [facility], this writer-re-iterated that we could not evaluate her father here d/t full schedules. Advised her that the best thing to do was take her father to the ED.  Reluctantly, she agreed and will let us know the outcome.

## 2015-01-25 ENCOUNTER — Telehealth: Payer: Self-pay | Admitting: Internal Medicine

## 2015-01-25 DIAGNOSIS — J449 Chronic obstructive pulmonary disease, unspecified: Secondary | ICD-10-CM

## 2015-01-25 NOTE — Telephone Encounter (Signed)
Called spoke with daughter [name]. She wants to know if MW would RX a nebulizer for at home through [facility]. She reports pt is beginning to have a little more prod cough and some increase SOB w/ exertion. Pt was recently admitted but reports she spoke with MW at that time. Please advise MW thanks

## 2015-01-29 ENCOUNTER — Other Ambulatory Visit: Payer: Self-pay | Admitting: *Deleted

## 2015-01-29 ENCOUNTER — Ambulatory Visit (HOSPITAL_COMMUNITY)
Admission: RE | Admit: 2015-01-29 | Discharge: 2015-01-29 | Disposition: A | Discharge status: Home / Self Care | Payer: Medicare Other | Source: Ambulatory Visit | Attending: Oncology | Admitting: Oncology

## 2015-01-29 DIAGNOSIS — D649 Anemia, unspecified: Secondary | ICD-10-CM

## 2015-01-29 MED ORDER — ALBUTEROL SULFATE (2.5 MG/3ML) 0.083% IN NEBU: 2.5000 mg | INHALATION_SOLUTION | RESPIRATORY_TRACT | Status: AC | PRN

## 2015-01-29 MED ORDER — ALBUTEROL SULFATE (2.5 MG/3ML) 0.083% IN NEBU: 2.5000 mg | INHALATION_SOLUTION | RESPIRATORY_TRACT | Status: DC | PRN

## 2015-01-29 NOTE — Telephone Encounter (Addendum)
Daughter aware of rec's per MW - Rx for albuterol called into [facility] [phone] and neb machine order sent to [facility].   Requesting something for yeast - pt has a very bad yeast infection and has tried using OTC Monistat cream but it is not working. Would like something called in for po.  Please advise Dr [name]. Thanks.

## 2015-01-29 NOTE — Telephone Encounter (Signed)
Dr. [name], please advise if okay to order Nebulizer for home use through [facility].

## 2015-01-29 NOTE — Telephone Encounter (Signed)
Diflucan 100 mg daily x3 days  °

## 2015-01-29 NOTE — Telephone Encounter (Signed)
Fine with me  Albuterol 2.5 mg q 4 h prn Dx chronic asthmatic bronchitis

## 2015-01-30 ENCOUNTER — Ambulatory Visit (HOSPITAL_BASED_OUTPATIENT_CLINIC_OR_DEPARTMENT_OTHER): Payer: Medicare Other

## 2015-01-30 ENCOUNTER — Other Ambulatory Visit (HOSPITAL_BASED_OUTPATIENT_CLINIC_OR_DEPARTMENT_OTHER): Payer: Medicare Other

## 2015-01-30 VITALS — BP 110/68 | HR 88 | Temp 98.3°F

## 2015-01-30 DIAGNOSIS — N189 Chronic kidney disease, unspecified: Secondary | ICD-10-CM

## 2015-01-30 DIAGNOSIS — D61818 Other pancytopenia: Secondary | ICD-10-CM

## 2015-01-30 DIAGNOSIS — Z452 Encounter for adjustment and management of vascular access device: Secondary | ICD-10-CM

## 2015-01-30 DIAGNOSIS — D631 Anemia in chronic kidney disease: Secondary | ICD-10-CM

## 2015-01-30 DIAGNOSIS — D469 Myelodysplastic syndrome, unspecified: Secondary | ICD-10-CM

## 2015-01-30 DIAGNOSIS — Z95828 Presence of other vascular implants and grafts: Secondary | ICD-10-CM

## 2015-01-30 LAB — CBC WITH DIFFERENTIAL/PLATELET
BASO%: 0.2 % (ref 0.0–2.0)
Basophils Absolute: 0 10*3/uL (ref 0.0–0.1)
EOS%: 0.8 % (ref 0.0–7.0)
Eosinophils Absolute: 0 10*3/uL (ref 0.0–0.5)
HEMATOCRIT: 29.2 % — AB (ref 38.4–49.9)
HGB: 9.8 g/dL — ABNORMAL LOW (ref 13.0–17.1)
LYMPH#: 0.4 10*3/uL — AB (ref 0.9–3.3)
LYMPH%: 39.4 % (ref 14.0–49.0)
MCH: 33.9 pg — ABNORMAL HIGH (ref 27.2–33.4)
MCHC: 33.5 g/dL (ref 32.0–36.0)
MCV: 101.2 fL — ABNORMAL HIGH (ref 79.3–98.0)
MONO#: 0 10*3/uL — ABNORMAL LOW (ref 0.1–0.9)
MONO%: 0.5 % (ref 0.0–14.0)
NEUT#: 0.7 10*3/uL — ABNORMAL LOW (ref 1.5–6.5)
NEUT%: 59.1 % (ref 39.0–75.0)
Platelets: 75 10*3/uL — ABNORMAL LOW (ref 140–400)
RBC: 2.89 10*6/uL — AB (ref 4.20–5.82)
RDW: 23.2 % — ABNORMAL HIGH (ref 11.0–14.6)
WBC: 1.1 10*3/uL — AB (ref 4.0–10.3)

## 2015-01-30 LAB — HOLD TUBE, BLOOD BANK

## 2015-01-30 MED ORDER — FLUCONAZOLE 100 MG PO TABS: 100.0000 mg | ORAL_TABLET | Freq: Every day | ORAL | Status: DC

## 2015-01-30 MED ORDER — DARBEPOETIN ALFA 300 MCG/0.6ML IJ SOSY
300.0000 ug | PREFILLED_SYRINGE | Freq: Once | INTRAMUSCULAR | Status: AC
  Administered 2015-01-30: 300 ug via SUBCUTANEOUS
  Filled 2015-01-30: qty 0.6

## 2015-01-30 MED ORDER — HEPARIN SOD (PORK) LOCK FLUSH 100 UNIT/ML IV SOLN
500.0000 [IU] | Freq: Once | INTRAVENOUS | Status: AC
  Administered 2015-01-30: 500 [IU] via INTRAVENOUS
  Filled 2015-01-30: qty 5

## 2015-01-30 MED ORDER — SODIUM CHLORIDE 0.9 % IJ SOLN
10.0000 mL | INTRAMUSCULAR | Status: DC | PRN
  Administered 2015-01-30: 10 mL via INTRAVENOUS
  Filled 2015-01-30: qty 10

## 2015-01-30 NOTE — Telephone Encounter (Signed)
rx sent to preferred pharmacy.  Nothing further needed.  

## 2015-01-30 NOTE — Telephone Encounter (Signed)
lmtcb X1 for pt's daughter to relay recs and verify pharmacy that med needs to be sent to.

## 2015-01-30 NOTE — Patient Instructions (Signed)

## 2015-01-30 NOTE — Telephone Encounter (Signed)
Spoke to the daughter told her they were going to call in the [facility]

## 2015-01-31 ENCOUNTER — Encounter: Payer: Self-pay | Admitting: Acute Care

## 2015-01-31 ENCOUNTER — Ambulatory Visit (INDEPENDENT_AMBULATORY_CARE_PROVIDER_SITE_OTHER): Payer: Medicare Other | Admitting: Acute Care

## 2015-01-31 VITALS — BP 138/78 | HR 91 | Temp 98.1°F | Ht 71.0 in | Wt 231.2 lb

## 2015-01-31 DIAGNOSIS — B49 Unspecified mycosis: Secondary | ICD-10-CM | POA: Insufficient documentation

## 2015-01-31 DIAGNOSIS — J189 Pneumonia, unspecified organism: Secondary | ICD-10-CM

## 2015-01-31 DIAGNOSIS — A419 Sepsis, unspecified organism: Secondary | ICD-10-CM | POA: Diagnosis not present

## 2015-01-31 NOTE — Progress Notes (Signed)
Subjective:    Patient ID: [name], male    DOB: [birth year], 82 y.o.   MRN: [id number]  HPI  82 year old white male, former smoker( Quit date 49), with mild obesity complicated by aodm, Lymphoma s/p chemo ([facility]) and tendency to peripheral edema that is felt to be dependent and related to obesity/ venous insuff.Here for Follow up hospital admission visit.  Admit date: 01/18/2015 Discharge date: 01/22/2015  Recommendations for Outpatient Follow-up:  1. Discharge home with home health RN and PT. Follow-up with PCP in one week 2. Patient will complete 7 days of antibiotics on 01/25/2015   Discharge Diagnoses:  Principal Problem:  Sepsis due to pneumonia [facility])   Active Problems:  Lobar pneumonia, unspecified organism ([place])  Lactic acidosis  Pancytopenia (HCC)  MDS (myelodysplastic syndrome) (HCC)  Neutropenia (HCC)  Thrombocytopenia (HCC)  Anemia of chronic disease  Pacemaker  Chronic diastolic CHF (congestive heart failure) ([place])  Significant Studies     01/31/2015 :Follow up OV/ Pneumonia:  Patient continues to improve clinically. Breathing is improving daily, denies fever, productive cough. Is getting stronger daily in what his daughter describes as " baby steps". Continues with Physical Therapy for deconditioning. Developed a fungal rash to head of penis which is improving with Diflucan x 3 days and anti-fungal OTC cream.   Current outpatient prescriptions:  .  acetaminophen (TYLENOL) 500 MG tablet, Take 500 mg by mouth every 6 (six) hours as needed for mild pain or headache., Disp: , Rfl:  .  albuterol (PROVENTIL HFA;VENTOLIN HFA) 108 (90 BASE) MCG/ACT inhaler, Inhale 1-2 puffs into the lungs every 4 (four) hours as needed for wheezing or shortness of breath. (Patient taking differently: Inhale 2 puffs into the lungs every 4 (four) hours as needed for wheezing or shortness of breath. ), Disp: 8 g, Rfl: 5 .  ALPRAZolam (XANAX) 0.25 MG  tablet, Take 1 tablet (0.25 mg total) by mouth at bedtime as needed for anxiety., Disp: 30 tablet, Rfl: 0 .  aspirin 81 MG tablet, Take 81 mg by mouth every morning. (hold if bleeding), Disp: , Rfl:  .  calcium-vitamin D (OS-CAL 500 + D) 500-200 MG-UNIT per tablet, Take 1 tablet by mouth 2 (two) times daily. , Disp: , Rfl:  .  Cholecalciferol (VITAMIN D) 1000 UNITS capsule, Take 1,000 Units by mouth 2 (two) times daily.  , Disp: , Rfl:  .  docusate sodium (COLACE) 100 MG capsule, Take 100 mg by mouth daily as needed for moderate constipation. , Disp: , Rfl:  .  DULERA 200-5 MCG/ACT AERO, INHALE 2 PUFFS FIRST THING EVERY MORNING AND THEN ANOTHER 2 PUFFS ABOUT 12 HOURS LATER, Disp: 1 Inhaler, Rfl: 5 .  famotidine (PEPCID) 20 MG tablet, Take 20 mg by mouth at bedtime. , Disp: , Rfl:  .  finasteride (PROSCAR) 5 MG tablet, Take 5 mg by mouth every morning. , Disp: , Rfl:  .  fluconazole (DIFLUCAN) 100 MG tablet, Take 1 tablet (100 mg total) by mouth daily., Disp: 3 tablet, Rfl: 0 .  furosemide (LASIX) 40 MG tablet, TAKE 1 TABLET BY MOUTH DAILY. MAY TAKE 1 EXTRA TABLET DAILY IF NEEDED FOR SWELLING IN LEGS, Disp: 30 tablet, Rfl: 5 .  guaiFENesin-dextromethorphan (ROBITUSSIN DM) 100-10 MG/5ML syrup, Take 5 mLs by mouth every 4 (four) hours as needed for cough., Disp: 118 mL, Rfl: 0 .  KLOR-CON M20 20 MEQ tablet, TAKE 1 TABLET BY MOUTH EVERY DAY, Disp: 30 tablet, Rfl: 5 .  levothyroxine (SYNTHROID,  LEVOTHROID) 25 MCG tablet, TAKE 1 TABLET BY MOUTH EVERY MORNING BEFORE BREAKFAST, Disp: 30 tablet, Rfl: 3 .  lidocaine-prilocaine (EMLA) cream, Apply 1 application topically as needed. (Patient taking differently: Apply 1 application topically as needed (port access). ), Disp: 30 g, Rfl: 2 .  metFORMIN (GLUCOPHAGE) 500 MG tablet, TAKE 1 TABLET BY MOUTH TWICE A DAY WITH FOOD (Patient taking differently: TAKE 1/2 TABLET BY MOUTH TWICE A DAY AFTER MEALS), Disp: 60 tablet, Rfl: 5 .  Multiple Vitamin (MULTIVITAMIN)  tablet, Take 1 tablet by mouth daily.  , Disp: , Rfl:  .  NASONEX 50 MCG/ACT nasal spray, USE 2 SPRAYS NASALLY TWICE DAILY, Disp: 17 g, Rfl: 11 .  nitroGLYCERIN (NITROSTAT) 0.4 MG SL tablet, Place 1 tablet (0.4 mg total) under the tongue every 5 (five) minutes as needed for chest pain., Disp: 90 tablet, Rfl: 3 .  omeprazole (PRILOSEC) 20 MG capsule, Take 40 mg by mouth daily before breakfast. , Disp: , Rfl:  .  oxybutynin (DITROPAN-XL) 10 MG 24 hr tablet, Take 10 mg by mouth every morning. , Disp: , Rfl:  .  sulindac (CLINORIL) 200 MG tablet, TAKE 1 TABLET BY MOUTH TWICE A DAY (Patient taking differently: TAKE 1 TABLET BY MOUTH TWICE A DAY as needed for pain), Disp: 60 tablet, Rfl: 5 .  traMADol (ULTRAM) 50 MG tablet, Take 1/2 to one  tablet by mouth every 4 hours as needed for severe pain or cough, Disp: 40 tablet, Rfl: 0 .  traZODone (DESYREL) 150 MG tablet, 2/3 to 1 tablet at bedtime (Patient taking differently: Take 100 mg by mouth at bedtime. ), Disp: 30 tablet, Rfl: 5 .  albuterol (PROVENTIL) (2.5 MG/3ML) 0.083% nebulizer solution, Take 3 mLs (2.5 mg total) by nebulization every 4 (four) hours as needed for wheezing or shortness of breath. (Patient not taking: Reported on 01/31/2015), Disp: 75 mL, Rfl: 12  Review of Systems Constitutional:   No  weight loss, night sweats,  Fevers, chills, fatigue, or  lassitude.  HEENT:   No headaches,  Difficulty swallowing,  Tooth/dental problems, or  Sore throat,                No sneezing, itching, ear ache, nasal congestion, post nasal drip,   CV:  No chest pain,  Orthopnea, PND, swelling in lower extremities, anasarca, dizziness, palpitations, syncope.   GI  No heartburn, indigestion, abdominal pain, nausea, vomiting, diarrhea, change in bowel habits, loss of appetite, bloody stools.   Resp: No shortness of breath with exertion or at rest.  No excess mucus, no productive cough,  No non-productive cough,  No coughing up of blood.  No change in color of  mucus.  No wheezing.  No chest wall deformity  Skin: no rash or lesions.  GU: no dysuria, change in color of urine, no urgency or frequency.  No flank pain, no hematuria , fungal rash to head of penis  MS:  No joint pain or swelling.  No decreased range of motion.  No back pain.  Psych:  No change in mood or affect. No depression or anxiety.  No memory loss.   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 [facility]  - MRI 11/23/2007 c/w L1-2 bulging disc indents the thecal sac with foraminal stenosis  HEALTH MAINTENANCE..........................................................................................Marland KitchenWert  - Td 10/2005  - Pneumovax 10/07 second shot  - CPX 08/20/2010  --colon 02/2005 -int/extern. hems (repeat q7y)  Complex med regimen  --Meds reviewed with pt education and computerized med calendar completed/adjusted. November 08, 2008 , August 21, 2009 updated 02/19/2011 , 10/23/2011 , 09/19/2013  Syncope...........................................................................................................Marland KitchenHochrein  - Mobitz II AV block s/p Medtronic pacemaker placement 12/11/2008  Dermatology.....................................................................................................[facility]  - Pruritic rash 04/2009 > tried lotrisone, referred  to [facility] August 06, 2009  Memory impairment , MMSE 27/30 8/25>refer to neuro   Family History:  mother had diabetes   Social History:  Patient states former smoker.  quit smoking in 1970  No ETOH  Retired       Objective:   Physical Exam   BP 138/78 mmHg  Pulse 91  Temp(Src) 98.1 F (36.7 C) (Oral)  Ht 5\' 11"  (1.803 m)  Wt 231 lb 3.2 oz (104.872 kg)  BMI 32.26 kg/m2  SpO2 96%  Physical Exam:  General- Elderly gentleman, in a wheelchair,No distress,  A&Ox3, pleasant ENT: No sinus tenderness, TM clear, pale nasal mucosa, no oral exudate,no post nasal drip, no LAN Cardiac: S1, S2, regular rate and rhythm, no murmur Chest: No wheeze/ positive rales/ dullness in left mid and lower lobe, and right lower lobe; no accessory muscle use, no nasal flaring, no sternal retractions Abd.: Soft Non-tender Ext: 1+ edema bilateral lower extremities Neuro:  Deconditioned 2/2 recent hospitalization, in wheel chair, using cane Skin: No rashes, warm and dry Psych: normal mood and behavior       Assessment & Plan:

## 2015-01-31 NOTE — Assessment & Plan Note (Signed)
Fungal rash to head of penis after antibiotic treatment for pneumonia. Diflucan called in by Dr. [name] 01/25/15.  Plan: Continue using the anti fungal cream as needed. Let us know if you have any burning when you urinate so we can follow up as is clinically indicated.

## 2015-01-31 NOTE — Progress Notes (Signed)
Chart and office note reviewed in detail along with available xrays/ labs > agree with a/p as outlined  

## 2015-01-31 NOTE — Patient Instructions (Addendum)
Continue to use your [facility] and [employer]. Start using your nebulize treatments as soon as the neb machine arrives. Let us know if you need any neb treatments. Continue PT for strengthening. Watch carefully for any change in sputum color, for any fever, or any change in general well being and call to be fit in so we can be proactive in addressing the change. Eat yogurt or take probiotic as you are able for your fungal infection. Let us know if there is any new burning when urinating. We will see you in 2 weeks for follow up CXR with Dr. [name]. Please contact office for sooner follow up if symptoms do not improve or worsen or seek emergency care

## 2015-01-31 NOTE — Assessment & Plan Note (Signed)
Pt. Continues to improve clinically. Denies fever, productive cough, shortness of breath. Levaquin completed 01/27/15  Plan: Continue to use your [facility] and [employer]. Start using your nebulize treatments as soon as the neb machine arrives.  Call if any fever, increase in SOB, productive colorful sputum so we can address proactively. Follow up in 2 weeks with Dr. [name] with CXR.

## 2015-02-11 LAB — HIV ANTIBODY (ROUTINE TESTING W REFLEX): HIV SCREEN 4TH GENERATION: NONREACTIVE

## 2015-02-12 ENCOUNTER — Encounter: Payer: Self-pay | Admitting: Internal Medicine

## 2015-02-12 ENCOUNTER — Other Ambulatory Visit (INDEPENDENT_AMBULATORY_CARE_PROVIDER_SITE_OTHER): Payer: Medicare Other

## 2015-02-12 ENCOUNTER — Ambulatory Visit (INDEPENDENT_AMBULATORY_CARE_PROVIDER_SITE_OTHER): Payer: Medicare Other | Admitting: Internal Medicine

## 2015-02-12 ENCOUNTER — Ambulatory Visit (INDEPENDENT_AMBULATORY_CARE_PROVIDER_SITE_OTHER)
Admission: RE | Admit: 2015-02-12 | Discharge: 2015-02-12 | Disposition: A | Discharge status: Home / Self Care | Payer: Medicare Other | Source: Ambulatory Visit | Attending: Internal Medicine | Admitting: Internal Medicine

## 2015-02-12 VITALS — BP 112/64 | HR 92 | Ht 71.0 in | Wt 238.0 lb

## 2015-02-12 DIAGNOSIS — R350 Frequency of micturition: Secondary | ICD-10-CM | POA: Insufficient documentation

## 2015-02-12 DIAGNOSIS — J189 Pneumonia, unspecified organism: Secondary | ICD-10-CM

## 2015-02-12 DIAGNOSIS — A419 Sepsis, unspecified organism: Secondary | ICD-10-CM

## 2015-02-12 LAB — URINALYSIS
BILIRUBIN URINE: NEGATIVE
HGB URINE DIPSTICK: NEGATIVE
Ketones, ur: NEGATIVE
Leukocytes, UA: NEGATIVE
Nitrite: NEGATIVE
Specific Gravity, Urine: 1.01 (ref 1.000–1.030)
TOTAL PROTEIN, URINE-UPE24: NEGATIVE
Urine Glucose: NEGATIVE
Urobilinogen, UA: 0.2 (ref 0.0–1.0)
pH: 7 (ref 5.0–8.0)

## 2015-02-12 NOTE — Assessment & Plan Note (Signed)
Completely resolved on cxr/ no further rx needed  I had an extended discussion with the patient/daughter reviewing all relevant studies completed to date and  lasting 15 to 20 minutes of a 25 minute visit    Each maintenance medication was reviewed in detail including most importantly the difference between maintenance and prns and under what circumstances the prns are to be triggered using an action plan format that is not reflected in the computer generated alphabetically organized AVS but trather by a customized med calendar that reflects the AVS meds with confirmed 100% correlation.   Please see instructions for details which were reviewed in writing and the patient given a copy highlighting the part that I personally wrote and discussed at today's ov.

## 2015-02-12 NOTE — Progress Notes (Signed)
Subjective:    Patient ID: [name], male    DOB: 1933/01/23, 82 y.o.   MRN: [id number]  History of Present Illness  82 year old white male, former smoker( Quit date 62), with mild obesity complicated by aodm, Lymphoma s/p chemo ([facility]) and tendency to peripheral edema that is felt to be dependent and related to obesity/ venous insuff.Here for Follow up hospital admission visit.   Admit date: 01/18/2015 Discharge date: 01/22/2015  Discharge Diagnoses:  Principal Problem:  Sepsis due to pneumonia [facility]) Active Problems:  Lobar pneumonia, unspecified organism ([place])  Lactic acidosis  Pancytopenia (HCC)  MDS (myelodysplastic syndrome) (HCC)  Neutropenia (HCC)  Thrombocytopenia (HCC)  Anemia of chronic disease  Pacemaker  Chronic diastolic CHF (congestive heart failure) ([place])    01/31/2015 :NP Follow up OV/ Pneumonia:  Patient continues to improve clinically. Breathing is improving daily, denies fever, productive cough. Is getting stronger daily in what his daughter describes as " baby steps". Continues with Physical Therapy for deconditioning. Developed a fungal rash to head of penis which is improving with Diflucan x 3 days and anti-fungal OTC cream. rec Continue to use your [facility] and [employer]. Start using your nebulize treatments as soon as the neb machine arrives. Let us know if you need any neb treatments. Continue PT for strengthening. Watch carefully for any change in sputum color, for any fever, or any change in general well being and call to be fit in so we can be proactive in addressing the change. Eat yogurt or take probiotic as you are able for your fungal infection. Let us know if there is any new burning when urinating.    02/12/2015  f/u ov/Castulo Scarpelli re:  S/p admit with ? pna presenting with low abd pain resolved  Chief Complaint  Patient presents with  . Follow-up    Pt stats that his breathing continues to improve. He is starting to gain  his strength back. Still having night sweats.   pos freq urination/ no cough or chest pain /wheeze or sob  Or abd pain and he has very little memory of what he complained of when he presented to the hospital but the notes all indicate it was a lower abdominal discomfort not in obvious respiratory complaint at all. Denies straining/ burning/ decrease fos  No obvious day to day or daytime variability or assoc chronic cough or cp or chest tightness, subjective wheeze or overt sinus or hb symptoms. No unusual exp hx or h/o childhood pna/ asthma or knowledge of premature birth.  Sleeping ok without nocturnal  or early am exacerbation  of respiratory  c/o's or need for noct saba. Also denies any obvious fluctuation of symptoms with weather or environmental changes or other aggravating or alleviating factors except as outlined above   Current Medications, Allergies, Complete Past Medical History, Past Surgical History, Family History, and Social History were reviewed in [employer] record.  ROS  The following are not active complaints unless bolded sore throat, dysphagia, dental problems, itching, sneezing,  nasal congestion or excess/ purulent secretions, ear ache,   fever, chills, sweats, unintended wt loss, classically pleuritic or exertional cp, hemoptysis,  orthopnea pnd or leg swelling, presyncope, palpitations, abdominal pain, anorexia, nausea, vomiting, diarrhea  or change in bowel or bladder habits, change in stools or urine, dysuria,hematuria,  rash, arthralgias, visual complaints, headache, numbness, weakness or ataxia or problems with walking or coordination,  change in mood/affect or memory.  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 [facility]  - MRI 11/23/2007 c/w L1-2 bulging disc indents the thecal sac with foraminal stenosis  HEALTH MAINTENANCE..........................................................................................Marland KitchenWert  - Td 10/2005  - Pneumovax 10/07 second shot  - CPX 08/20/2010  --colon 02/2005 -int/extern. hems (repeat q7y)  Complex med regimen  --Meds reviewed with pt education and computerized med calendar completed/adjusted. November 08, 2008 , August 21, 2009 updated 02/19/2011 , 10/23/2011 , 09/19/2013  Syncope...........................................................................................................Marland KitchenHochrein  - Mobitz II AV block s/p Medtronic pacemaker placement 12/11/2008  Dermatology.....................................................................................................[facility]  - Pruritic rash 04/2009 > tried lotrisone, referred to [facility] August 06, 2009  Memory impairment , MMSE 27/30 8/25>refer to neuro   Family History:  mother had diabetes   Social History:  Patient states former smoker.  quit smoking in 1970  No ETOH  Retired       Objective:   Physical Exam     amb obese wm nad/ arrived in wc but climbed up on exam table with min assist   Wt Readings from Last 3 Encounters:  02/12/15 238 lb (107.956 kg)  01/31/15 231 lb 3.2 oz (104.872 kg)  01/18/15 244 lb 14.9 oz (111.1 kg)    Vital signs reviewed      Physical Exam:  General- Elderly gentleman,  No distress,  A&Ox3, pleasant ENT: No sinus tenderness, TM clear, pale  nasal mucosa, no oral exudate,no post nasal drip, no LAN Cardiac: S1, S2, regular rate and rhythm, no murmur Chest: No wheeze/   no accessory muscle use, no nasal flaring, no sternal retractions Abd.: Soft Non-tender Ext: trace sym pitting edema bilateral lower extremities Neuro:  Alert/ nl sensorium, no motor def Skin: No rashes, warm and dry Psych: normal mood and behavior    CXR PA and Lateral:   02/12/2015 :    I personally reviewed images and agree with radiology impression as follows:   1. Minimal residual vague opacity at the right lung apex, decreased in the interval, suggesting resolving infectious/inflammatory process. Recommend follow-up PA and lateral chest radiographs in 2-3 months to confirm complete resolution. 2. Stable mild-to-moderate cardiomegaly without overt pulmonary edema. 3. Stable trace bilateral pleural effusions.    Labs ordered 02/12/2015 U/A neg with sg 1.010    Assessment & Plan:

## 2015-02-12 NOTE — Progress Notes (Signed)
Quick Note:  Spoke with pt and notified of results per Dr. Wert. Pt verbalized understanding and denied any questions.  ______ 

## 2015-02-12 NOTE — Patient Instructions (Addendum)
Please remember to go to the lab department downstairs for your tests - we will call you with the results when they are available.  See calendar for specific medication instructions and bring it back for each and every office visit for every healthcare provider you see.  Without it,  you may not receive the best quality medical care that we feel you deserve.  You will note that the calendar groups together  your maintenance  medications that are timed at particular times of the day.  Think of this as your checklist for what your doctor has instructed you to do until your next evaluation to see what benefit  there is  to staying on a consistent group of medications intended to keep you well.  The other group at the bottom is entirely up to you to use as you see fit  for specific symptoms that may arise between visits that require you to treat them on an as needed basis.  Think of this as your action plan or "what if" list.   Separating the top medications from the bottom group is fundamental to providing you adequate care going forward.    Please schedule a follow up visit in 3 months but call sooner if needed     

## 2015-02-13 ENCOUNTER — Ambulatory Visit (HOSPITAL_BASED_OUTPATIENT_CLINIC_OR_DEPARTMENT_OTHER): Payer: Medicare Other

## 2015-02-13 ENCOUNTER — Other Ambulatory Visit: Payer: Self-pay | Admitting: *Deleted

## 2015-02-13 ENCOUNTER — Other Ambulatory Visit (HOSPITAL_BASED_OUTPATIENT_CLINIC_OR_DEPARTMENT_OTHER): Payer: Medicare Other

## 2015-02-13 VITALS — BP 131/75 | HR 72 | Temp 98.7°F | Resp 20

## 2015-02-13 VITALS — BP 105/54 | HR 87 | Temp 97.9°F

## 2015-02-13 DIAGNOSIS — D61818 Other pancytopenia: Secondary | ICD-10-CM

## 2015-02-13 DIAGNOSIS — N189 Chronic kidney disease, unspecified: Secondary | ICD-10-CM

## 2015-02-13 DIAGNOSIS — D649 Anemia, unspecified: Secondary | ICD-10-CM | POA: Diagnosis not present

## 2015-02-13 DIAGNOSIS — D631 Anemia in chronic kidney disease: Secondary | ICD-10-CM

## 2015-02-13 DIAGNOSIS — D469 Myelodysplastic syndrome, unspecified: Secondary | ICD-10-CM | POA: Diagnosis present

## 2015-02-13 LAB — CBC WITH DIFFERENTIAL/PLATELET
BASO%: 0 % (ref 0.0–2.0)
BASOS ABS: 0 10*3/uL (ref 0.0–0.1)
EOS%: 0 % (ref 0.0–7.0)
Eosinophils Absolute: 0 10*3/uL (ref 0.0–0.5)
HEMATOCRIT: 22.3 % — AB (ref 38.4–49.9)
HEMOGLOBIN: 7.7 g/dL — AB (ref 13.0–17.1)
LYMPH#: 0.4 10*3/uL — AB (ref 0.9–3.3)
LYMPH%: 46.2 % (ref 14.0–49.0)
MCH: 34.5 pg — AB (ref 27.2–33.4)
MCHC: 34.5 g/dL (ref 32.0–36.0)
MCV: 100 fL — ABNORMAL HIGH (ref 79.3–98.0)
MONO#: 0 10*3/uL — ABNORMAL LOW (ref 0.1–0.9)
MONO%: 1.1 % (ref 0.0–14.0)
NEUT#: 0.5 10*3/uL — CL (ref 1.5–6.5)
NEUT%: 52.7 % (ref 39.0–75.0)
Platelets: 78 10*3/uL — ABNORMAL LOW (ref 140–400)
RBC: 2.23 10*6/uL — ABNORMAL LOW (ref 4.20–5.82)
RDW: 22.2 % — AB (ref 11.0–14.6)
WBC: 0.9 10*3/uL — CL (ref 4.0–10.3)

## 2015-02-13 LAB — PREPARE RBC (CROSSMATCH)

## 2015-02-13 MED ORDER — DIPHENHYDRAMINE HCL 25 MG PO CAPS: 25.0000 mg | ORAL_CAPSULE | Freq: Once | ORAL | Status: DC

## 2015-02-13 MED ORDER — ACETAMINOPHEN 325 MG PO TABS: 650.0000 mg | ORAL_TABLET | Freq: Once | ORAL | Status: DC

## 2015-02-13 MED ORDER — SODIUM CHLORIDE 0.9 % IV SOLN
Freq: Once | INTRAVENOUS | Status: AC
  Administered 2015-02-13: 11:00:00 via INTRAVENOUS

## 2015-02-13 MED ORDER — HEPARIN SOD (PORK) LOCK FLUSH 100 UNIT/ML IV SOLN
500.0000 [IU] | Freq: Every day | INTRAVENOUS | Status: AC | PRN
  Administered 2015-02-13: 500 [IU]
  Filled 2015-02-13: qty 5

## 2015-02-13 MED ORDER — SODIUM CHLORIDE 0.9 % IJ SOLN
10.0000 mL | INTRAMUSCULAR | Status: AC | PRN
  Administered 2015-02-13: 10 mL
  Filled 2015-02-13: qty 10

## 2015-02-13 MED ORDER — DARBEPOETIN ALFA 300 MCG/0.6ML IJ SOSY
300.0000 ug | PREFILLED_SYRINGE | Freq: Once | INTRAMUSCULAR | Status: AC
  Administered 2015-02-13: 300 ug via SUBCUTANEOUS
  Filled 2015-02-13: qty 0.6

## 2015-02-13 NOTE — Patient Instructions (Signed)

## 2015-02-13 NOTE — Assessment & Plan Note (Signed)
Urinalysis is unrevealing. Patient denies any difficulty with initiating urine /dribbling/ hesitation/ decreased force of stream or any abdominal discomfort at all so we will simply follow this conservatively for now.

## 2015-02-14 LAB — TYPE AND SCREEN
ABO/RH(D): A POS
ANTIBODY SCREEN: NEGATIVE
UNIT DIVISION: 0
UNIT DIVISION: 0

## 2015-02-16 ENCOUNTER — Other Ambulatory Visit: Payer: Self-pay | Admitting: Internal Medicine

## 2015-02-19 ENCOUNTER — Other Ambulatory Visit: Payer: Self-pay | Admitting: Internal Medicine

## 2015-02-27 ENCOUNTER — Other Ambulatory Visit (HOSPITAL_BASED_OUTPATIENT_CLINIC_OR_DEPARTMENT_OTHER): Payer: Medicare Other

## 2015-02-27 ENCOUNTER — Ambulatory Visit: Payer: Medicare Other

## 2015-02-27 ENCOUNTER — Ambulatory Visit (HOSPITAL_BASED_OUTPATIENT_CLINIC_OR_DEPARTMENT_OTHER): Payer: Medicare Other

## 2015-02-27 ENCOUNTER — Ambulatory Visit (HOSPITAL_COMMUNITY)
Admission: RE | Admit: 2015-02-27 | Discharge: 2015-02-27 | Disposition: A | Discharge status: Home / Self Care | Payer: Medicare Other | Source: Ambulatory Visit | Attending: Oncology | Admitting: Oncology

## 2015-02-27 ENCOUNTER — Encounter: Payer: Self-pay | Admitting: *Deleted

## 2015-02-27 VITALS — BP 150/84 | HR 80 | Temp 98.0°F | Resp 18

## 2015-02-27 DIAGNOSIS — Z95828 Presence of other vascular implants and grafts: Secondary | ICD-10-CM

## 2015-02-27 DIAGNOSIS — D61818 Other pancytopenia: Secondary | ICD-10-CM

## 2015-02-27 DIAGNOSIS — D631 Anemia in chronic kidney disease: Secondary | ICD-10-CM

## 2015-02-27 DIAGNOSIS — D649 Anemia, unspecified: Secondary | ICD-10-CM

## 2015-02-27 DIAGNOSIS — D469 Myelodysplastic syndrome, unspecified: Secondary | ICD-10-CM | POA: Insufficient documentation

## 2015-02-27 DIAGNOSIS — N189 Chronic kidney disease, unspecified: Secondary | ICD-10-CM

## 2015-02-27 LAB — CBC WITH DIFFERENTIAL/PLATELET
BASO%: 0 % (ref 0.0–2.0)
Basophils Absolute: 0 10*3/uL (ref 0.0–0.1)
EOS%: 1.1 % (ref 0.0–7.0)
Eosinophils Absolute: 0 10*3/uL (ref 0.0–0.5)
HCT: 23.7 % — ABNORMAL LOW (ref 38.4–49.9)
HGB: 8.2 g/dL — ABNORMAL LOW (ref 13.0–17.1)
LYMPH%: 64.4 % — AB (ref 14.0–49.0)
MCH: 34.5 pg — ABNORMAL HIGH (ref 27.2–33.4)
MCHC: 34.6 g/dL (ref 32.0–36.0)
MCV: 99.6 fL — AB (ref 79.3–98.0)
MONO#: 0 10*3/uL — ABNORMAL LOW (ref 0.1–0.9)
MONO%: 1.1 % (ref 0.0–14.0)
NEUT%: 33.4 % — ABNORMAL LOW (ref 39.0–75.0)
NEUTROS ABS: 0.3 10*3/uL — AB (ref 1.5–6.5)
Platelets: 58 10*3/uL — ABNORMAL LOW (ref 140–400)
RBC: 2.38 10*6/uL — AB (ref 4.20–5.82)
RDW: 22.7 % — ABNORMAL HIGH (ref 11.0–14.6)
WBC: 0.9 10*3/uL — AB (ref 4.0–10.3)
lymph#: 0.6 10*3/uL — ABNORMAL LOW (ref 0.9–3.3)

## 2015-02-27 LAB — PREPARE RBC (CROSSMATCH)

## 2015-02-27 LAB — HOLD TUBE, BLOOD BANK

## 2015-02-27 MED ORDER — DARBEPOETIN ALFA 300 MCG/0.6ML IJ SOSY
300.0000 ug | PREFILLED_SYRINGE | Freq: Once | INTRAMUSCULAR | Status: AC
  Administered 2015-02-27: 300 ug via SUBCUTANEOUS
  Filled 2015-02-27: qty 0.6

## 2015-02-27 MED ORDER — ACETAMINOPHEN 325 MG PO TABS
ORAL_TABLET | ORAL | Status: AC
Start: 2015-02-27 — End: 2015-02-27
  Filled 2015-02-27: qty 2

## 2015-02-27 MED ORDER — DIPHENHYDRAMINE HCL 25 MG PO CAPS
ORAL_CAPSULE | ORAL | Status: AC
  Filled 2015-02-27: qty 1

## 2015-02-27 MED ORDER — SODIUM CHLORIDE 0.9% FLUSH
10.0000 mL | INTRAVENOUS | Status: DC | PRN
  Administered 2015-02-27: 10 mL via INTRAVENOUS
  Filled 2015-02-27: qty 10

## 2015-02-27 MED ADMIN — Sodium Chloride Flush IV Soln 0.9%: 10 mL | NDC 8881579121

## 2015-02-27 MED ADMIN — Sodium Chloride IV Soln 0.9%: 250 mL | INTRAVENOUS | NDC 00338004902

## 2015-02-27 MED ADMIN — Heparin Sodium (Porcine) Lock Flush IV Soln 100 Unit/ML: 500 [IU] | NDC 64253033335

## 2015-02-27 MED FILL — Heparin Sodium (Porcine) Lock Flush IV Soln 100 Unit/ML: INTRAVENOUS | Qty: 5 | Status: AC

## 2015-02-27 MED FILL — Sodium Chloride Flush IV Soln 0.9%: INTRAVENOUS | Qty: 10 | Status: AC

## 2015-02-27 NOTE — Patient Instructions (Signed)

## 2015-02-27 NOTE — Progress Notes (Signed)
Aranesp injection given by infusion nurse while getting a blood transfusion

## 2015-02-28 LAB — TYPE AND SCREEN
ABO/RH(D): A POS
ANTIBODY SCREEN: NEGATIVE
UNIT DIVISION: 0
UNIT DIVISION: 0

## 2015-03-04 ENCOUNTER — Ambulatory Visit (INDEPENDENT_AMBULATORY_CARE_PROVIDER_SITE_OTHER): Payer: Medicare Other | Admitting: *Deleted

## 2015-03-04 ENCOUNTER — Telehealth: Payer: Self-pay | Admitting: Cardiology

## 2015-03-04 DIAGNOSIS — I442 Atrioventricular block, complete: Secondary | ICD-10-CM

## 2015-03-04 NOTE — Telephone Encounter (Signed)
LMOVM reminding pt to send remote transmission.   

## 2015-03-05 ENCOUNTER — Ambulatory Visit (INDEPENDENT_AMBULATORY_CARE_PROVIDER_SITE_OTHER): Payer: Medicare Other | Admitting: Adult Health

## 2015-03-05 ENCOUNTER — Encounter: Payer: Self-pay | Admitting: Adult Health

## 2015-03-05 VITALS — BP 130/78 | HR 94 | Temp 98.4°F | Ht 71.0 in | Wt 238.0 lb

## 2015-03-05 DIAGNOSIS — J45991 Cough variant asthma: Secondary | ICD-10-CM | POA: Diagnosis not present

## 2015-03-05 DIAGNOSIS — D469 Myelodysplastic syndrome, unspecified: Secondary | ICD-10-CM | POA: Diagnosis not present

## 2015-03-05 DIAGNOSIS — I5032 Chronic diastolic (congestive) heart failure: Secondary | ICD-10-CM

## 2015-03-05 MED ORDER — FINASTERIDE 5 MG PO TABS: 5.0000 mg | ORAL_TABLET | Freq: Every morning | ORAL | Status: DC

## 2015-03-05 MED ORDER — ALPRAZOLAM 0.25 MG PO TABS: 0.2500 mg | ORAL_TABLET | Freq: Every evening | ORAL | Status: DC | PRN

## 2015-03-05 NOTE — Assessment & Plan Note (Signed)
Appears compensated without evidence of flare  Cont on current regimen  Med calendar updated.

## 2015-03-05 NOTE — Assessment & Plan Note (Signed)
Controlled on rx   

## 2015-03-05 NOTE — Addendum Note (Signed)
Addended by: Osa Craver on: 03/05/2015 03:03 PM   Modules accepted: Medications

## 2015-03-05 NOTE — Assessment & Plan Note (Signed)
Cont follow up with hematology /oncology .

## 2015-03-05 NOTE — Patient Instructions (Addendum)
Follow med calendar closely and bring to each visit.  Follow up in 3 months with Dr. [name]  And As needed

## 2015-03-05 NOTE — Progress Notes (Signed)
Subjective:    Patient ID: [name], male    DOB: 1932/07/27, 82 y.o.   MRN: [id number]  History of Present Illness  82 year old white male, former smoker( Quit date 20), with mild obesity complicated by aodm, Lymphoma s/p chemo ([facility]) and tendency to peripheral edema that is felt to be dependent and related to obesity/ venous insuff.Here for Follow up hospital admission visit.   Admit date: 01/18/2015 Discharge date: 01/22/2015  Discharge Diagnoses:  Principal Problem:  Sepsis due to pneumonia [facility]) Active Problems:  Lobar pneumonia, unspecified organism ([place])  Lactic acidosis  Pancytopenia (HCC)  MDS (myelodysplastic syndrome) (HCC)  Neutropenia (HCC)  Thrombocytopenia (HCC)  Anemia of chronic disease  Pacemaker  Chronic diastolic CHF (congestive heart failure) ([place])    01/31/2015 :NP Follow up OV/ Pneumonia:  Patient continues to improve clinically. Breathing is improving daily, denies fever, productive cough. Is getting stronger daily in what his daughter describes as " baby steps". Continues with Physical Therapy for deconditioning. Developed a fungal rash to head of penis which is improving with Diflucan x 3 days and anti-fungal OTC cream. rec Continue to use your [facility] and [employer]. Start using your nebulize treatments as soon as the neb machine arrives. Let us know if you need any neb treatments. Continue PT for strengthening. Watch carefully for any change in sputum color, for any fever, or any change in general well being and call to be fit in so we can be proactive in addressing the change. Eat yogurt or take probiotic as you are able for your fungal infection. Let us know if there is any new burning when urinating.    02/12/2015  f/u ov/[facility] re:  S/p admit with ? pna presenting with low abd pain resolved  Chief Complaint  Patient presents with  . Follow-up    Pt stats that his breathing continues to improve. He is starting to gain  his strength back. Still having night sweats.   pos freq urination/ no cough or chest pain /wheeze or sob  Or abd pain and he has very little memory of what he complained of when he presented to the hospital but the notes all indicate it was a lower abdominal discomfort not in obvious respiratory complaint at all. Denies straining/ burning/ decrease fos >>>no changes   03/05/2015 Follow up : DCHF , Cough variant asthma , DM  Pt returns for a 3 week follow up .  Says overall he is doing better. Recovering from PNA.  Clinically is improving with improved energy.  Working with PT at home.  Now able to walk with cane.  Minimal cough and congestion . No fever or discolored mucus.  No hemoptysis .  No increased leg swelling or orthopnea  Needs updated med calendar list, reviewed with pt and daughter.  Would like a refill of xanax, uses it at night to help with sleep. On low dose .    Current Medications, Allergies, Complete Past Medical History, Past Surgical History, Family History, and Social History were reviewed in [employer] record.  ROS  The following are not active complaints unless bolded sore throat, dysphagia, dental problems, itching, sneezing,  nasal congestion or excess/ purulent secretions, ear ache,   fever, chills, sweats, unintended wt loss, classically pleuritic or exertional cp, hemoptysis,  orthopnea pnd or leg swelling, presyncope, palpitations, abdominal pain, anorexia, nausea, vomiting, diarrhea  or change in bowel or bladder habits, change in stools or urine, dysuria,hematuria,  rash, arthralgias,  visual complaints, headache, numbness, weakness or ataxia or problems with walking or coordination,  change in mood/affect or memory.              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. [facility]  - MRI 11/23/2007 c/w L1-2 bulging disc indents the thecal sac with foraminal stenosis  HEALTH MAINTENANCE..........................................................................................[facility]  - Td 10/2005  - Pneumovax 10/07 second shot  - CPX 08/20/2010  --colon 02/2005 -int/extern. hems (repeat q7y)  Complex med regimen  --Meds reviewed with pt education and computerized med calendar completed/adjusted. November 08, 2008 , August 21, 2009 updated 02/19/2011 , 10/23/2011 , 09/19/2013  Syncope...........................................................................................................[facility]  - Mobitz II AV block s/p Medtronic pacemaker placement 12/11/2008  D[facility]at[facility].....................................................................................................[facility]  - Pruritic rash 04/2009 > tried lotrisone, referred to [facility] August 06, 2009  Memory impairment , MMSE 27/30 8/25>refer to neuro   Family History:  mother had diabetes   Social History:  Patient states former smoker.  quit smoking in 1970  No ETOH  Retired       Objective:   Physical Exam     amb obese wm nad/ uses cane   Vital signs reviewed  Filed Vitals:   03/05/15 1406  BP: 130/78  Pulse: 94  Temp: 98.4 F (36.9 C)  TempSrc: Oral  Height: 5\' 11"  (1.803 m)  Weight: 238 lb (107.956 kg)  SpO2: 97%     Physical Exam:  General- Elderly gentleman,  No distress,  A&Ox3, pleasant ENT: No  sinus tenderness, TM clear, pale nasal mucosa, no oral exudate,no post nasal drip, no LAN Cardiac: S1, S2, regular rate and rhythm, no murmur Chest: No wheeze/   no accessory muscle use, diminished bs in bases  Abd.: Soft Non-tender Ext: trace sym pitting edema bilateral lower extremities Neuro:  Alert/ nl sensorium, no motor def Skin: No rashes, warm and dry Psych: normal mood and behavior    CXR PA and Lateral:   02/12/2015 :    1. Minimal residual vague opacity at the right lung apex, decreased in the interval, suggesting resolving infectious/inflammatory process. Recommend follow-up PA and lateral chest radiographs in 2-3 months to confirm complete resolution. 2. Stable mild-to-moderate cardiomegaly without overt pulmonary edema. 3. Stable trace bilateral pleural effusions.    Labs ordered 02/12/2015 U/A neg with sg 1.010    Assessment & Plan:

## 2015-03-05 NOTE — Progress Notes (Signed)
Remote pacemaker transmission.   

## 2015-03-05 NOTE — Addendum Note (Signed)
Addended by: Osa Craver on: 03/05/2015 03:31 PM   Modules accepted: Orders

## 2015-03-06 NOTE — Progress Notes (Signed)
Chart and office note reviewed in detail  > agree with a/p as outlined    

## 2015-03-12 ENCOUNTER — Telehealth: Payer: Self-pay | Admitting: Internal Medicine

## 2015-03-12 NOTE — Telephone Encounter (Signed)
Glad he's better on the 60 mg dose - would not push lasix harder for now -  will need ov by end of week to regroup re chronic diuretic rx's/ K supplementation

## 2015-03-12 NOTE — Telephone Encounter (Signed)
Spoke with Eoin from [employer], aware of MW's recs.  States he will have pt's daughter call office for ov.   atc pt's daughter to schedule ov, line rang several times, no answer, no vm. Wcb.

## 2015-03-12 NOTE — Telephone Encounter (Signed)
Spoke with [employer] PT, calling on behalf of the patient, reports possible CHF exacerabtion Reports fluid retention, SOB and pain/cramping in legs.  Eoin also notes pt has significant swelling in legs. Not sure of how much weight gain as patient did not check this. Increased Lasix to 60mg  today - normally on 4mg  QD States that the patient reports much improvement from yesterday.  Please advise Dr [name] of any further rec's.

## 2015-03-13 ENCOUNTER — Other Ambulatory Visit (HOSPITAL_BASED_OUTPATIENT_CLINIC_OR_DEPARTMENT_OTHER): Payer: Medicare Other

## 2015-03-13 ENCOUNTER — Ambulatory Visit: Payer: Medicare Other

## 2015-03-13 ENCOUNTER — Ambulatory Visit (HOSPITAL_BASED_OUTPATIENT_CLINIC_OR_DEPARTMENT_OTHER): Payer: Medicare Other

## 2015-03-13 ENCOUNTER — Other Ambulatory Visit: Payer: Self-pay | Admitting: Hematology and Oncology

## 2015-03-13 VITALS — BP 122/78 | HR 80 | Temp 98.8°F | Resp 18

## 2015-03-13 DIAGNOSIS — D61818 Other pancytopenia: Secondary | ICD-10-CM | POA: Diagnosis not present

## 2015-03-13 DIAGNOSIS — D469 Myelodysplastic syndrome, unspecified: Secondary | ICD-10-CM

## 2015-03-13 DIAGNOSIS — Z95828 Presence of other vascular implants and grafts: Secondary | ICD-10-CM

## 2015-03-13 DIAGNOSIS — N189 Chronic kidney disease, unspecified: Secondary | ICD-10-CM

## 2015-03-13 DIAGNOSIS — D631 Anemia in chronic kidney disease: Secondary | ICD-10-CM

## 2015-03-13 LAB — CBC WITH DIFFERENTIAL/PLATELET
BASO%: 0.3 % (ref 0.0–2.0)
BASOS ABS: 0 10*3/uL (ref 0.0–0.1)
EOS%: 0.9 % (ref 0.0–7.0)
Eosinophils Absolute: 0 10*3/uL (ref 0.0–0.5)
HEMATOCRIT: 26.7 % — AB (ref 38.4–49.9)
HGB: 8.9 g/dL — ABNORMAL LOW (ref 13.0–17.1)
LYMPH#: 0.6 10*3/uL — AB (ref 0.9–3.3)
LYMPH%: 61.7 % — AB (ref 14.0–49.0)
MCH: 33.6 pg — AB (ref 27.2–33.4)
MCHC: 33.4 g/dL (ref 32.0–36.0)
MCV: 100.7 fL — ABNORMAL HIGH (ref 79.3–98.0)
MONO#: 0 10*3/uL — ABNORMAL LOW (ref 0.1–0.9)
MONO%: 0.7 % (ref 0.0–14.0)
NEUT#: 0.3 10*3/uL — CL (ref 1.5–6.5)
NEUT%: 36.4 % — AB (ref 39.0–75.0)
Platelets: 69 10*3/uL — ABNORMAL LOW (ref 140–400)
RBC: 2.65 10*6/uL — AB (ref 4.20–5.82)
RDW: 24.9 % — ABNORMAL HIGH (ref 11.0–14.6)
WBC: 1 10*3/uL — ABNORMAL LOW (ref 4.0–10.3)

## 2015-03-13 LAB — PREPARE RBC (CROSSMATCH)

## 2015-03-13 MED ORDER — DIPHENHYDRAMINE HCL 25 MG PO CAPS: 25.0000 mg | ORAL_CAPSULE | Freq: Once | ORAL | Status: DC

## 2015-03-13 MED ORDER — SODIUM CHLORIDE 0.9% FLUSH
10.0000 mL | INTRAVENOUS | Status: DC | PRN
  Administered 2015-03-13: 10 mL via INTRAVENOUS
  Filled 2015-03-13: qty 10

## 2015-03-13 MED ORDER — ACETAMINOPHEN 325 MG PO TABS: 650.0000 mg | ORAL_TABLET | Freq: Once | ORAL | Status: DC

## 2015-03-13 MED ORDER — HEPARIN SOD (PORK) LOCK FLUSH 100 UNIT/ML IV SOLN
500.0000 [IU] | Freq: Once | INTRAVENOUS | Status: DC
  Filled 2015-03-13: qty 5

## 2015-03-13 MED ORDER — SODIUM CHLORIDE 0.9% FLUSH
10.0000 mL | INTRAVENOUS | Status: AC | PRN
  Administered 2015-03-13: 10 mL
  Filled 2015-03-13: qty 10

## 2015-03-13 MED ORDER — SODIUM CHLORIDE 0.9 % IV SOLN
250.0000 mL | Freq: Once | INTRAVENOUS | Status: AC
  Administered 2015-03-13: 250 mL via INTRAVENOUS

## 2015-03-13 MED ORDER — HEPARIN SOD (PORK) LOCK FLUSH 100 UNIT/ML IV SOLN
500.0000 [IU] | Freq: Every day | INTRAVENOUS | Status: AC | PRN
  Administered 2015-03-13: 500 [IU]
  Filled 2015-03-13: qty 5

## 2015-03-13 MED ORDER — DARBEPOETIN ALFA 300 MCG/0.6ML IJ SOSY
300.0000 ug | PREFILLED_SYRINGE | Freq: Once | INTRAMUSCULAR | Status: AC
  Administered 2015-03-13: 300 ug via SUBCUTANEOUS
  Filled 2015-03-13: qty 0.6

## 2015-03-13 NOTE — Patient Instructions (Signed)

## 2015-03-13 NOTE — Progress Notes (Signed)
Hgb: 8.9 and feeling dizzy, SOB, and has a headache. On call physician notified (MD Alvy Bimler). Patient will be receiving 1 unit of PRBCs.

## 2015-03-13 NOTE — Patient Instructions (Signed)
Darbepoetin Alfa injection What is this medicine? DARBEPOETIN ALFA (dar be POE e tin AL fa) helps your body make more red blood cells. It is used to treat anemia caused by chronic kidney failure and chemotherapy. This medicine may be used for other purposes; ask your health care provider or pharmacist if you have questions. What should I tell my health care provider before I take this medicine? They need to know if you have any of these conditions: -blood clotting disorders or history of blood clots -cancer patient not on chemotherapy -cystic fibrosis -heart disease, such as angina, heart failure, or a history of a heart attack -hemoglobin level of 12 g/dL or greater -high blood pressure -low levels of folate, iron, or vitamin B12 -seizures -an unusual or allergic reaction to darbepoetin, erythropoietin, albumin, hamster proteins, latex, other medicines, foods, dyes, or preservatives -pregnant or trying to get pregnant -breast-feeding How should I use this medicine? This medicine is for injection into a vein or under the skin. It is usually given by a health care professional in a hospital or clinic setting. If you get this medicine at home, you will be taught how to prepare and give this medicine. Do not shake the solution before you withdraw a dose. Use exactly as directed. Take your medicine at regular intervals. Do not take your medicine more often than directed. It is important that you put your used needles and syringes in a special sharps container. Do not put them in a trash can. If you do not have a sharps container, call your pharmacist or healthcare provider to get one. Talk to your pediatrician regarding the use of this medicine in children. While this medicine may be used in children as young as 1 year for selected conditions, precautions do apply. Overdosage: If you think you have taken too much of this medicine contact a poison control center or emergency room at once. NOTE:  This medicine is only for you. Do not share this medicine with others. What if I miss a dose? If you miss a dose, take it as soon as you can. If it is almost time for your next dose, take only that dose. Do not take double or extra doses. What may interact with this medicine? Do not take this medicine with any of the following medications: -epoetin alfa This list may not describe all possible interactions. Give your health care provider a list of all the medicines, herbs, non-prescription drugs, or dietary supplements you use. Also tell them if you smoke, drink alcohol, or use illegal drugs. Some items may interact with your medicine. What should I watch for while using this medicine? Visit your prescriber or health care professional for regular checks on your progress and for the needed blood tests and blood pressure measurements. It is especially important for the doctor to make sure your hemoglobin level is in the desired range, to limit the risk of potential side effects and to give you the best benefit. Keep all appointments for any recommended tests. Check your blood pressure as directed. Ask your doctor what your blood pressure should be and when you should contact him or her. As your body makes more red blood cells, you may need to take iron, folic acid, or vitamin B supplements. Ask your doctor or health care provider which products are right for you. If you have kidney disease continue dietary restrictions, even though this medication can make you feel better. Talk with your doctor or health care professional about the   foods you eat and the vitamins that you take. What side effects may I notice from receiving this medicine? Side effects that you should report to your doctor or health care professional as soon as possible: -allergic reactions like skin rash, itching or hives, swelling of the face, lips, or tongue -breathing problems -changes in vision -chest pain -confusion, trouble speaking  or understanding -feeling faint or lightheaded, falls -high blood pressure -muscle aches or pains -pain, swelling, warmth in the leg -rapid weight gain -severe headaches -sudden numbness or weakness of the face, arm or leg -trouble walking, dizziness, loss of balance or coordination -seizures (convulsions) -swelling of the ankles, feet, hands -unusually weak or tired Side effects that usually do not require medical attention (report to your doctor or health care professional if they continue or are bothersome): -diarrhea -fever, chills (flu-like symptoms) -headaches -nausea, vomiting -redness, stinging, or swelling at site where injected This list may not describe all possible side effects. Call your doctor for medical advice about side effects. You may report side effects to FDA at 1-800-FDA-1088. Where should I keep my medicine? Keep out of the reach of children. Store in a refrigerator between 2 and 8 degrees C (36 and 46 degrees F). Do not freeze. Do not shake. Throw away any unused portion if using a single-dose vial. Throw away any unused medicine after the expiration date. NOTE: This sheet is a summary. It may not cover all possible information. If you have questions about this medicine, talk to your doctor, pharmacist, or health care provider.    2016, Elsevier/Gold Standard. (2007-12-27 10:23:57) Blood Transfusion  A blood transfusion is a procedure in which you receive donated blood through an IV tube. You may need a blood transfusion because of illness, surgery, or injury. The blood may come from a donor, or it may be your own blood that you donated previously. The blood given in a transfusion is made up of different types of cells. You may receive:  Red blood cells. These carry oxygen and replace lost blood.  Platelets. These control bleeding.  Plasma. Thishelps blood to clot. If you have hemophilia or another clotting disorder, you may also receive other types of  blood products. LET YOUR HEALTH CARE PROVIDER KNOW ABOUT:  Any allergies you have.  All medicines you are taking, including vitamins, herbs, eye drops, creams, and over-the-counter medicines.  Previous problems you or members of your family have had with the use of anesthetics.  Any blood disorders you have.  Previous surgeries you have had.  Any medical conditions you may have.  Any previous reactions you have had during a blood transfusion.  RISKS AND COMPLICATIONS Generally, this is a safe procedure. However, problems may occur, including:  Having an allergic reaction to something in the donated blood.  Fever. This may be a reaction to the white blood cells in the transfused blood.  Iron overload. This can happen from having many transfusions.  Transfusion-related acute lung injury (TRALI). This is a rare reaction that causes lung damage. The cause is not known.TRALI can occur within hours of a transfusion or several days later.  Sudden (acute) or delayed hemolytic reactions. This happens if your blood does not match the cells in your transfusion. Your body's defense system (immune system) may try to attack the new cells. This complication is rare.  Infection. This is rare. BEFORE THE PROCEDURE  You may have a blood test to determine your blood type. This is necessary to know what kind of   blood your body will accept.  If you are going to have a planned surgery, you may donate your own blood. This may be done in case you need to have a transfusion.  If you have had an allergic reaction to a transfusion in the past, you may be given medicine to help prevent a reaction. Take this medicine only as directed by your health care provider.  You will have your temperature, blood pressure, and pulse monitored before the transfusion. PROCEDURE   An IV will be started in your hand or arm.  The bag of donated blood will be attached to your IV tube and given into your vein.  Your  temperature, blood pressure, and pulse will be monitored regularly during the transfusion. This monitoring is done to detect early signs of a transfusion reaction.  If you have any signs or symptoms of a reaction, your transfusion will be stopped and you may be given medicine.  When the transfusion is over, your IV will be removed.  Pressure may be applied to the IV site for a few minutes.  A bandage (dressing) will be applied. The procedure may vary among health care providers and hospitals. AFTER THE PROCEDURE  Your blood pressure, temperature, and pulse will be monitored regularly.   This information is not intended to replace advice given to you by your health care provider. Make sure you discuss any questions you have with your health care provider.   Document Released: 01/10/2000 Document Revised: 02/02/2014 Document Reviewed: 11/22/2013 Elsevier Interactive Patient Education 2016 Elsevier Inc.  

## 2015-03-13 NOTE — Progress Notes (Signed)
Aranesp injection given by infusion nurse after receiving blood transfusion. 

## 2015-03-13 NOTE — Telephone Encounter (Signed)
Attempted to contact pt's daughter. No answer, no option to leave a message. Will try back.  

## 2015-03-14 LAB — TYPE AND SCREEN
ABO/RH(D): A POS
Antibody Screen: NEGATIVE
UNIT DIVISION: 0

## 2015-03-14 NOTE — Telephone Encounter (Signed)
Called spoke with pt daughter Loma Sousa. She does not feel pt needs OV. Pt took extra fluid pill and swelling is fine. She feels the therapists overreacted. She did not schedule an appt. Will forward to MW as an Pharmacist, hospital

## 2015-03-27 ENCOUNTER — Ambulatory Visit: Payer: Medicare Other

## 2015-03-27 ENCOUNTER — Ambulatory Visit (HOSPITAL_COMMUNITY)
Admission: RE | Admit: 2015-03-27 | Discharge: 2015-03-27 | Disposition: A | Discharge status: Home / Self Care | Payer: Medicare Other | Source: Ambulatory Visit | Attending: Oncology | Admitting: Oncology

## 2015-03-27 ENCOUNTER — Other Ambulatory Visit (HOSPITAL_BASED_OUTPATIENT_CLINIC_OR_DEPARTMENT_OTHER): Payer: Medicare Other

## 2015-03-27 ENCOUNTER — Ambulatory Visit (HOSPITAL_BASED_OUTPATIENT_CLINIC_OR_DEPARTMENT_OTHER): Payer: Medicare Other | Admitting: Oncology

## 2015-03-27 ENCOUNTER — Ambulatory Visit (HOSPITAL_BASED_OUTPATIENT_CLINIC_OR_DEPARTMENT_OTHER): Payer: Medicare Other

## 2015-03-27 ENCOUNTER — Telehealth: Payer: Self-pay | Admitting: Oncology

## 2015-03-27 VITALS — BP 119/67 | HR 86 | Temp 98.4°F | Resp 18

## 2015-03-27 VITALS — BP 131/62 | HR 106 | Temp 98.0°F | Resp 17 | Ht 71.0 in | Wt 239.4 lb

## 2015-03-27 DIAGNOSIS — D709 Neutropenia, unspecified: Secondary | ICD-10-CM | POA: Diagnosis not present

## 2015-03-27 DIAGNOSIS — D696 Thrombocytopenia, unspecified: Secondary | ICD-10-CM | POA: Diagnosis not present

## 2015-03-27 DIAGNOSIS — D469 Myelodysplastic syndrome, unspecified: Secondary | ICD-10-CM | POA: Diagnosis present

## 2015-03-27 DIAGNOSIS — D61818 Other pancytopenia: Secondary | ICD-10-CM | POA: Diagnosis present

## 2015-03-27 DIAGNOSIS — Z95828 Presence of other vascular implants and grafts: Secondary | ICD-10-CM

## 2015-03-27 DIAGNOSIS — D649 Anemia, unspecified: Secondary | ICD-10-CM

## 2015-03-27 LAB — PREPARE RBC (CROSSMATCH)

## 2015-03-27 LAB — CBC WITH DIFFERENTIAL/PLATELET
BASO%: 0.5 % (ref 0.0–2.0)
Basophils Absolute: 0 10*3/uL (ref 0.0–0.1)
EOS ABS: 0 10*3/uL (ref 0.0–0.5)
EOS%: 1 % (ref 0.0–7.0)
HEMATOCRIT: 26.2 % — AB (ref 38.4–49.9)
HEMOGLOBIN: 8.9 g/dL — AB (ref 13.0–17.1)
LYMPH#: 0.6 10*3/uL — AB (ref 0.9–3.3)
LYMPH%: 60 % — ABNORMAL HIGH (ref 14.0–49.0)
MCH: 34.5 pg — ABNORMAL HIGH (ref 27.2–33.4)
MCHC: 34 g/dL (ref 32.0–36.0)
MCV: 101.4 fL — AB (ref 79.3–98.0)
MONO#: 0 10*3/uL — AB (ref 0.1–0.9)
MONO%: 0.7 % (ref 0.0–14.0)
NEUT%: 37.8 % — ABNORMAL LOW (ref 39.0–75.0)
NEUTROS ABS: 0.4 10*3/uL — AB (ref 1.5–6.5)
PLATELETS: 71 10*3/uL — AB (ref 140–400)
RBC: 2.58 10*6/uL — ABNORMAL LOW (ref 4.20–5.82)
RDW: 26.2 % — ABNORMAL HIGH (ref 11.0–14.6)
WBC: 0.9 10*3/uL — AB (ref 4.0–10.3)

## 2015-03-27 MED ORDER — SODIUM CHLORIDE 0.9% FLUSH
10.0000 mL | INTRAVENOUS | Status: AC | PRN
  Administered 2015-03-27: 10 mL
  Filled 2015-03-27: qty 10

## 2015-03-27 MED ORDER — HEPARIN SOD (PORK) LOCK FLUSH 100 UNIT/ML IV SOLN
500.0000 [IU] | Freq: Every day | INTRAVENOUS | Status: AC | PRN
  Administered 2015-03-27: 500 [IU]
  Filled 2015-03-27: qty 5

## 2015-03-27 MED ORDER — DIPHENHYDRAMINE HCL 25 MG PO CAPS: 25.0000 mg | ORAL_CAPSULE | Freq: Once | ORAL | Status: DC

## 2015-03-27 MED ORDER — SODIUM CHLORIDE 0.9% FLUSH
10.0000 mL | INTRAVENOUS | Status: DC | PRN
  Administered 2015-03-27: 10 mL via INTRAVENOUS
  Filled 2015-03-27: qty 10

## 2015-03-27 MED ORDER — ACETAMINOPHEN 325 MG PO TABS: 650.0000 mg | ORAL_TABLET | Freq: Once | ORAL | Status: DC

## 2015-03-27 MED ORDER — SODIUM CHLORIDE 0.9 % IV SOLN
250.0000 mL | Freq: Once | INTRAVENOUS | Status: AC
  Administered 2015-03-27: 250 mL via INTRAVENOUS

## 2015-03-27 MED ORDER — FUROSEMIDE 10 MG/ML IJ SOLN: 20.0000 mg | Freq: Once | INTRAMUSCULAR | Status: DC

## 2015-03-27 NOTE — Progress Notes (Signed)
Pt states he took 40mg  of Lasix at home prior to reporting to clinic. Dixie RN aware and per [employer] (Dr. Hazeline Junker nurse) monitor patient's BP to determine if IV lasix is needed while in clinic today.  1208: BP 119/67 No s/s of distress.

## 2015-03-27 NOTE — Telephone Encounter (Signed)
appt made and avs printed °

## 2015-03-27 NOTE — Patient Instructions (Signed)

## 2015-03-27 NOTE — Patient Instructions (Signed)

## 2015-03-27 NOTE — Progress Notes (Signed)
Hematology and Oncology Follow Up Visit  [name] [id number] [birth year] 82 y.o. 03/27/2015 8:59 AM  CC: [name]. [name], MD, FCCP    Principle Diagnosis: A 82 year old gentleman with the following diagnoses:   1. Follicular lymphoma, grade 3, stage III, diagnosed 2007. Continued to be in remission. 2. Pancytopenia associated with mild neutropenia and macrocytosis possibly indicating early myelodysplasia. Bone marrow  was repeated on 06/22/2012 confirmed the evidence of myelodysplastic syndrome 3. Monoclonal gammopathy of undetermined significance.  Prior Therapy:  1. Status post CHOP and rituximab.  He had complete response to therapy concluded in 2007.  No further imaging has been indicated at this time. 2. Status post bone marrow biopsy in April 2011, did not really show any evidence of myelodysplasia or metastatic lymphoma at this time.  Current therapy: Aranesp 300 mcg every 2weeks to keep his hemoglobin close to 10. He also has been receiving intermittent PRBC transfusions to keep Hgb above 9.  Interim History: [name] presents today for a followup visit with his daughter. Since his last visit, he reports no major changes in his health or performance status. He continues to ambulate short distances without any falls or syncope. He does use a cane and drives short distances. He tries to avoid walking outside because of instability and overall deconditioning. Despite all lap, he continues to live independently without any further decline.  He is not reporting any chest pain or dyspnea on exertion today. He has not reported any bleeding complications such as epistaxis or hematochezia. He denied any recent infections such as pneumonia, cellulitis or bronchitis.  He did not report any headaches or change in his vision. Does not report any seizures or confusion. He does not report any palpitation or orthopnea. Has not reported any edema. Does not report any wheezing but still has  exertional dyspnea.  He does not report any lymphadenopathy or petechiae. Has not reported any skin rashes or genitourinary complaints. Remainder of review of systems unremarkable.   Medications: I have reviewed the patient's current medications Current Outpatient Prescriptions  Medication Sig Dispense Refill  . acetaminophen (TYLENOL) 500 MG tablet Take 500 mg by mouth every 6 (six) hours as needed for mild pain or headache. Reported on 03/05/2015    . albuterol (PROVENTIL HFA;VENTOLIN HFA) 108 (90 BASE) MCG/ACT inhaler Inhale 1-2 puffs into the lungs every 4 (four) hours as needed for wheezing or shortness of breath. (Patient taking differently: Inhale 2 puffs into the lungs every 4 (four) hours as needed for wheezing or shortness of breath. ) 8 g 5  . albuterol (PROVENTIL) (2.5 MG/3ML) 0.083% nebulizer solution Take 3 mLs (2.5 mg total) by nebulization every 4 (four) hours as needed for wheezing or shortness of breath. 75 mL 12  . ALPRAZolam (XANAX) 0.25 MG tablet Take 1 tablet (0.25 mg total) by mouth at bedtime as needed for anxiety. (Patient taking differently: Take 0.25 mg by mouth at bedtime as needed for anxiety (and insomnia). ) 30 tablet 0  . aspirin 81 MG tablet Take 81 mg by mouth every morning. (hold if bleeding)    . calcium-vitamin D (OS-CAL 500 + D) 500-200 MG-UNIT per tablet Take 1 tablet by mouth 2 (two) times daily.     . cetirizine (ZYRTEC) 10 MG tablet Take 10 mg by mouth at bedtime.    . Cholecalciferol (VITAMIN D) 1000 UNITS capsule Take 1,000 Units by mouth 2 (two) times daily.      [name] dextromethorphan-guaiFENesin (MUCINEX DM) 30-600 MG 12hr  tablet Take 1 tablet by mouth 2 (two) times daily as needed for cough.     . docusate sodium (COLACE) 100 MG capsule Take 100 mg by mouth daily as needed for moderate constipation.     . DULERA 200-5 MCG/ACT AERO INHALE 2 PUFFS FIRST THING EVERY MORNING AND THEN ANOTHER 2 PUFFS ABOUT 12 HOURS LATER 1 Inhaler 5  . famotidine (PEPCID) 20 MG  tablet Take 20 mg by mouth at bedtime.     . finasteride (PROSCAR) 5 MG tablet Take 1 tablet (5 mg total) by mouth every morning. 30 tablet 1  . furosemide (LASIX) 40 MG tablet TAKE 1 TABLET BY MOUTH DAILY. MAY TAKE 1 EXTRA TABLET DAILY IF NEEDED FOR SWELLING IN LEGS 30 tablet 5  . guaiFENesin-dextromethorphan (ROBITUSSIN DM) 100-10 MG/5ML syrup Take 5 mLs by mouth every 4 (four) hours as needed for cough. 118 mL 0  . hydrOXYzine (ATARAX/VISTARIL) 25 MG tablet Take 1-2 tablets by mouth every 4-6 hours as needed for itching    . KLOR-CON M20 20 MEQ tablet TAKE 1 TABLET BY MOUTH EVERY DAY (Patient taking differently: TAKE 1 TABLET BY MOUTH EVERY MORNING) 30 tablet 5  . levothyroxine (SYNTHROID, LEVOTHROID) 25 MCG tablet TAKE 1 TABLET BY MOUTH EVERY MORNING BEFORE BREAKFAST 30 tablet 11  . lidocaine-prilocaine (EMLA) cream Apply 1 application topically as needed. 30 g 2  . metFORMIN (GLUCOPHAGE) 500 MG tablet TAKE 1 TABLET BY MOUTH TWICE A DAY WITH FOOD (Patient taking differently: TAKE 1/2 TABLET BY MOUTH TWICE A DAY AFTER MEALS) 60 tablet 5  . Multiple Vitamin (MULTIVITAMIN) tablet Take 1 tablet by mouth every morning.     [name] NASONEX 50 MCG/ACT nasal spray USE 2 SPRAYS NASALLY TWICE DAILY 17 g 11  . nitroGLYCERIN (NITROSTAT) 0.4 MG SL tablet Place 1 tablet (0.4 mg total) under the tongue every 5 (five) minutes as needed for chest pain. 90 tablet 3  . omeprazole (PRILOSEC) 20 MG capsule Take 40 mg by mouth daily before breakfast.     . oxybutynin (DITROPAN-XL) 10 MG 24 hr tablet Take 10 mg by mouth every morning.     . polyethylene glycol (MIRALAX / GLYCOLAX) packet Take 17 g by mouth daily as needed.    . sulindac (CLINORIL) 200 MG tablet TAKE 1 TABLET BY MOUTH TWICE A DAY (Patient taking differently: TAKE 1 TABLET BY MOUTH TWICE A DAY with food as needed for arthritis pain, hip, and back pain) 60 tablet 5  . traMADol (ULTRAM) 50 MG tablet Take 1/2 to one  tablet by mouth every 4 hours as needed for  severe pain or cough 40 tablet 0  . traZODone (DESYREL) 150 MG tablet 2/3 to 1 tablet at bedtime (Patient taking differently: Take 1/3 tablet by mouth at bedtime) 30 tablet 5   No current facility-administered medications for this visit.     Allergies: No Known Allergies  Past Medical History, Surgical history, Social history, and Family History were reviewed and updated.    Physical Exam: Blood pressure 131/62, pulse 106, temperature 98 F (36.7 C), temperature source Oral, resp. rate 17, height _0  (1.803 m), weight 239 lb 6.4 oz (108.591 kg), SpO2 97 %. ECOG: 1 General appearance: Alert, awake gentleman appeared to without distress today. Head: Normocephalic, without obvious abnormality. No thrush noted. Neck: no adenopathy. No palpable masses. Lymph nodes: Cervical, supraclavicular, and axillary nodes normal. Heart:regular rate and rhythm, S1, S2 normal, no murmur, click, rub or gallop Lung:chest clear, no wheezing, rales,  normal symmetric air entry. No dullness to percussion. Abdomen: soft, non-tender, without masses or organomegaly. No shifting dullness or ascites. EXT:no erythema, induration, or nodules. No edema noted. Skin: No rashes or lesions noted.    Lab Results: Lab Results  Component Value Date   WBC 0.9* 03/27/2015   HGB 8.9* 03/27/2015   HCT 26.2* 03/27/2015   MCV 101.4* 03/27/2015   PLT 71* 03/27/2015     Chemistry      Component Value Date/Time   NA 135 01/24/2015 1148   NA 137 01/05/2013 1501   K 3.5 01/24/2015 1148   K 3.8 01/05/2013 1501   CL 95* 01/24/2015 1148   CL 99 06/08/2012 1257   CO2 28 01/24/2015 1148   CO2 26 01/05/2013 1501   BUN 13 01/24/2015 1148   BUN 13.9 01/05/2013 1501   CREATININE 0.83 01/24/2015 1148   CREATININE 0.8 01/05/2013 1501      Component Value Date/Time   CALCIUM 9.2 01/24/2015 1148   CALCIUM 10.1 01/05/2013 1501   ALKPHOS 77 01/24/2015 1148   ALKPHOS 48 08/10/2012 1117   AST 31 01/24/2015 1148   AST 17  08/10/2012 1117   ALT 50 01/24/2015 1148   ALT 14 08/10/2012 1117   BILITOT 0.6 01/24/2015 1148   BILITOT 0.82 08/10/2012 1117      Impression and Plan:  This is a 83 year old gentleman with the following issues:  1. Follicular lymphoma diagnosed in 2007. He is status post systemic therapy and he has been in remission since that time. He has no signs or symptoms of relapse. 2. Pancytopenia due to myelodysplastic syndrome. He is status post bone marrow biopsy was done on 06/22/2012. He is currently on supportive care only and appears to be responding less to Aranesp. I plan to hold these injections for the time being and assess hemoglobin in the next 4-6 weeks. We can restarted if I noticed further drop in his hemoglobin. We will continue supportive transfusions to keep his hemoglobin above 9. He will receive 1 unit packed cell today. 3. Neutropenia: Neutropenic precautions were given to the patient today. No recent infections at this time. No need for any growth factor support at this time. We will consider adding growth factor support if he has developed recurrent infections. 4. Monoclonal gammopathy.  His M-spike had been less than 1 g/dL, his bone marrow biopsy continued to show a less than 8% plasma cells indicating most likely multiple myeloma.  Last SPEP was done in September 2016 and this will be repeated annually. 5. Thrombocytopenia: Likely related to his myelodysplasia without any bleeding. He does not require any intervention. 6. IV access: Port-A-Cath is in place without complications or bleeding. 7. Followup every 2 weeks for an injection and possible transfusion. He will have a clinical visit in 2 months.  [facility], MD 3/1/20178:59 AM

## 2015-03-28 LAB — TYPE AND SCREEN
ABO/RH(D): A POS
ANTIBODY SCREEN: NEGATIVE
Unit division: 0

## 2015-03-30 LAB — CUP PACEART REMOTE DEVICE CHECK
Battery Remaining Longevity: 119 mo
Brady Statistic AP VP Percent: 1 %
Brady Statistic AS VS Percent: 0 %
Date Time Interrogation Session: 20170206215733
Implantable Lead Implant Date: 20141204
Implantable Lead Location: 753859
Implantable Lead Model: 5076
Lead Channel Impedance Value: 447 Ohm
Lead Channel Setting Pacing Amplitude: 2.5 V
Lead Channel Setting Sensing Sensitivity: 4 mV
MDC IDC LEAD IMPLANT DT: 20141204
MDC IDC LEAD LOCATION: 753860
MDC IDC MSMT BATTERY IMPEDANCE: 133 Ohm
MDC IDC MSMT BATTERY VOLTAGE: 2.78 V
MDC IDC MSMT LEADCHNL RV IMPEDANCE VALUE: 431 Ohm
MDC IDC SET LEADCHNL RA PACING AMPLITUDE: 2 V
MDC IDC SET LEADCHNL RV PACING PULSEWIDTH: 0.4 ms
MDC IDC STAT BRADY AP VS PERCENT: 0 %
MDC IDC STAT BRADY AS VP PERCENT: 99 %

## 2015-03-30 NOTE — Progress Notes (Signed)
Normal remote reviewed. 4% AF burden, no OAC 2/2 pancytopenia  Next follow up 05/2015 with Greggory Brandy

## 2015-03-31 ENCOUNTER — Encounter: Payer: Self-pay | Admitting: Cardiology

## 2015-04-04 ENCOUNTER — Other Ambulatory Visit: Payer: Self-pay | Admitting: Internal Medicine

## 2015-04-04 ENCOUNTER — Other Ambulatory Visit: Payer: Self-pay | Admitting: Adult Health

## 2015-04-08 ENCOUNTER — Telehealth: Payer: Self-pay | Admitting: Internal Medicine

## 2015-04-08 NOTE — Telephone Encounter (Signed)
Ok x 3 refills

## 2015-04-08 NOTE — Telephone Encounter (Signed)
[facility] calling - Pt requesting refill of Xanax .25mg  - Sig: Take 1 tablet (0.25 mg total) by mouth at bedtime as needed for anxiety Upcoming OV 05/13/15 Last filled 03/05/15 #30 Please advise Dr [name]. Thanks.

## 2015-04-08 NOTE — Telephone Encounter (Signed)
Called [facility], spoke with [facility] Xanax .25mg  #30 x 3 refills.  Nothing further needed.

## 2015-04-10 ENCOUNTER — Other Ambulatory Visit (HOSPITAL_BASED_OUTPATIENT_CLINIC_OR_DEPARTMENT_OTHER): Payer: Medicare Other

## 2015-04-10 ENCOUNTER — Ambulatory Visit (HOSPITAL_BASED_OUTPATIENT_CLINIC_OR_DEPARTMENT_OTHER): Payer: Medicare Other

## 2015-04-10 ENCOUNTER — Ambulatory Visit: Payer: Medicare Other

## 2015-04-10 ENCOUNTER — Other Ambulatory Visit: Payer: Self-pay | Admitting: *Deleted

## 2015-04-10 ENCOUNTER — Encounter: Payer: Self-pay | Admitting: *Deleted

## 2015-04-10 VITALS — BP 127/68 | HR 88 | Temp 99.1°F | Resp 20

## 2015-04-10 DIAGNOSIS — Z95828 Presence of other vascular implants and grafts: Secondary | ICD-10-CM

## 2015-04-10 DIAGNOSIS — D469 Myelodysplastic syndrome, unspecified: Secondary | ICD-10-CM | POA: Diagnosis not present

## 2015-04-10 DIAGNOSIS — D61818 Other pancytopenia: Secondary | ICD-10-CM

## 2015-04-10 DIAGNOSIS — D649 Anemia, unspecified: Secondary | ICD-10-CM

## 2015-04-10 DIAGNOSIS — Z452 Encounter for adjustment and management of vascular access device: Secondary | ICD-10-CM | POA: Diagnosis present

## 2015-04-10 LAB — CBC WITH DIFFERENTIAL/PLATELET
BASO%: 0 % (ref 0.0–2.0)
BASOS ABS: 0 10*3/uL (ref 0.0–0.1)
EOS ABS: 0 10*3/uL (ref 0.0–0.5)
EOS%: 1.1 % (ref 0.0–7.0)
HCT: 23.9 % — ABNORMAL LOW (ref 38.4–49.9)
HGB: 8.3 g/dL — ABNORMAL LOW (ref 13.0–17.1)
LYMPH%: 47.9 % (ref 14.0–49.0)
MCH: 34.9 pg — AB (ref 27.2–33.4)
MCHC: 34.7 g/dL (ref 32.0–36.0)
MCV: 100.4 fL — AB (ref 79.3–98.0)
MONO#: 0 10*3/uL — ABNORMAL LOW (ref 0.1–0.9)
MONO%: 1.1 % (ref 0.0–14.0)
NEUT%: 49.9 % (ref 39.0–75.0)
NEUTROS ABS: 0.5 10*3/uL — AB (ref 1.5–6.5)
PLATELETS: 52 10*3/uL — AB (ref 140–400)
RBC: 2.38 10*6/uL — AB (ref 4.20–5.82)
RDW: 22.9 % — ABNORMAL HIGH (ref 11.0–14.6)
WBC: 0.9 10*3/uL — AB (ref 4.0–10.3)
lymph#: 0.5 10*3/uL — ABNORMAL LOW (ref 0.9–3.3)
nRBC: 0 % (ref 0–0)

## 2015-04-10 LAB — IRON AND TIBC
%SAT: 56 % — ABNORMAL HIGH (ref 20–55)
Iron: 119 ug/dL (ref 42–163)
TIBC: 214 ug/dL (ref 202–409)
UIBC: 95 ug/dL — ABNORMAL LOW (ref 117–376)

## 2015-04-10 LAB — FERRITIN: Ferritin: 2976 ng/ml — ABNORMAL HIGH (ref 22–316)

## 2015-04-10 LAB — PREPARE RBC (CROSSMATCH)

## 2015-04-10 MED ORDER — SODIUM CHLORIDE 0.9% FLUSH
10.0000 mL | INTRAVENOUS | Status: AC | PRN
  Administered 2015-04-10: 10 mL
  Filled 2015-04-10: qty 10

## 2015-04-10 MED ORDER — DIPHENHYDRAMINE HCL 25 MG PO CAPS
ORAL_CAPSULE | ORAL | Status: AC
  Filled 2015-04-10: qty 1

## 2015-04-10 MED ORDER — HEPARIN SOD (PORK) LOCK FLUSH 100 UNIT/ML IV SOLN
500.0000 [IU] | Freq: Once | INTRAVENOUS | Status: AC
  Administered 2015-04-10: 500 [IU] via INTRAVENOUS
  Filled 2015-04-10: qty 5

## 2015-04-10 MED ORDER — SODIUM CHLORIDE 0.9 % IV SOLN
250.0000 mL | Freq: Once | INTRAVENOUS | Status: AC
  Administered 2015-04-10: 250 mL via INTRAVENOUS

## 2015-04-10 MED ORDER — ACETAMINOPHEN 325 MG PO TABS
ORAL_TABLET | ORAL | Status: AC
  Filled 2015-04-10: qty 2

## 2015-04-10 MED ORDER — SODIUM CHLORIDE 0.9% FLUSH
10.0000 mL | INTRAVENOUS | Status: DC | PRN
  Administered 2015-04-10: 10 mL via INTRAVENOUS
  Filled 2015-04-10: qty 10

## 2015-04-10 NOTE — Patient Instructions (Signed)

## 2015-04-10 NOTE — Patient Instructions (Signed)

## 2015-04-10 NOTE — Progress Notes (Signed)
Pt took benadryl 25 mg and tylenol 625 mg that he brought from home.

## 2015-04-11 LAB — TYPE AND SCREEN
ABO/RH(D): A POS
Antibody Screen: NEGATIVE
Unit division: 0

## 2015-04-12 ENCOUNTER — Encounter: Payer: Self-pay | Admitting: Internal Medicine

## 2015-04-12 ENCOUNTER — Ambulatory Visit (INDEPENDENT_AMBULATORY_CARE_PROVIDER_SITE_OTHER): Payer: Medicare Other | Admitting: Internal Medicine

## 2015-04-12 VITALS — BP 136/74 | HR 101 | Temp 98.5°F | Ht 71.0 in | Wt 242.8 lb

## 2015-04-12 DIAGNOSIS — J45991 Cough variant asthma: Secondary | ICD-10-CM | POA: Diagnosis not present

## 2015-04-12 DIAGNOSIS — J440 Chronic obstructive pulmonary disease with acute lower respiratory infection: Secondary | ICD-10-CM | POA: Diagnosis not present

## 2015-04-12 DIAGNOSIS — J209 Acute bronchitis, unspecified: Secondary | ICD-10-CM | POA: Insufficient documentation

## 2015-04-12 MED ORDER — BUDESONIDE-FORMOTEROL FUMARATE 160-4.5 MCG/ACT IN AERO: INHALATION_SPRAY | RESPIRATORY_TRACT | Status: DC

## 2015-04-12 MED ORDER — BUDESONIDE-FORMOTEROL FUMARATE 160-4.5 MCG/ACT IN AERO: 2.0000 | INHALATION_SPRAY | Freq: Two times a day (BID) | RESPIRATORY_TRACT | Status: DC

## 2015-04-12 MED ORDER — FLUTICASONE PROPIONATE 50 MCG/ACT NA SUSP: NASAL | Status: AC

## 2015-04-12 MED ORDER — DOXYCYCLINE HYCLATE 100 MG PO TABS: 100.0000 mg | ORAL_TABLET | Freq: Two times a day (BID) | ORAL | Status: DC

## 2015-04-12 NOTE — Assessment & Plan Note (Signed)
Neutropenic patient with acute bronchitis pattern getting worse over the last 4 days. Previous history of hospitalization for pneumonia with sepsis. Plan-doxycycline, hydration, ER if worse

## 2015-04-12 NOTE — Patient Instructions (Addendum)
Script sent for doxycycline antibiotic  Ok to take a probiotic with the antibiotic  Script sent changing Dulera to Symbicort maintenance inhaler  Script sent changing nasonex to flonase nasal spray  Please call as needed  Keep April 17 appointment with Dr [name] unless you need help sooner

## 2015-04-12 NOTE — Assessment & Plan Note (Signed)
Changing from [facility] to Symbicort per insurance requirements Continue oxygen for sleep

## 2015-04-12 NOTE — Progress Notes (Signed)
History of Present Illness  82 year old white male, former smoker( Quit date 21), with mild obesity complicated by aodm, Lymphoma s/p chemo ([place]) and tendency to peripheral edema that is felt to be dependent and related to obesity/ venous insuff  04/12/2015-82 year old male former smoker followed by Dr. [name]- Medical problems include cough variant asthma, chronic diastolic CHF, pancytopenia, pneumonia with sepsis Seen here last by NP 03/05/2015 for post hospital visit in December for sepsis due to pneumonia ACUTE VISIT: MW patient; chest congestion and unable to cough anything up,sore throat, SOB and wheezing as well. Daughter here Sore throat for the last 4 days, cough nonproductive or scant yellow sputum after using nebulizer. Some sneezing. Got worse last night with mild fever. Neutropenic patient got 1 unit packed red cells 2 days ago from oncology. Insurance requiring replacements for Nasonex and Symbicort CXR 02/12/2015-resolution of right apical pneumonia, stable mild to moderate cardiomegaly without edema, stable trace pleural effusion  ROS-see HPI   Negative unless "+" Constitutional:    weight loss, night sweats, +fevers, chills, fatigue, lassitude. HEENT:    headaches, difficulty swallowing, tooth/dental problems, sore throat,       sneezing, itching, ear ache, nasal congestion, post nasal drip, snoring CV:    chest pain, orthopnea, PND, swelling in lower extremities, anasarca,                                               dizziness, palpitations Resp:   +shortness of breath with exertion or at rest.                +productive cough,   non-productive cough, coughing up of blood.              +change in color of mucus.  wheezing.   Skin:    rash or lesions. GI:  No-   heartburn, indigestion, abdominal pain, nausea, vomiting, diarrhea,                 change in bowel habits, loss of appetite GU: dysuria, change in color of urine, no urgency or frequency.   flank pain. MS:    joint pain, stiffness, decreased range of motion, back pain. Neuro-     nothing unusual Psych:  change in mood or affect.  depression or anxiety.   memory loss.  OBJ- Physical Exam General- Alert, Oriented, Affect-appropriate, Distress- none acute, overweight elderly male Skin- rash-none, lesions- none, excoriation- none Lymphadenopathy- none Head- atraumatic            Eyes- Gross vision intact, PERRLA, conjunctivae and secretions clear            Ears- Hearing, canals-normal            Nose- Clear, no-Septal dev, mucus, polyps, erosion, perforation             Throat- Mallampati II , mucosa clear , drainage- none, tonsils- atrophic Neck- flexible , trachea midline, no stridor , thyroid nl, carotid no bruit Chest - symmetrical excursion , unlabored           Heart/CV- RRR , no murmur , no gallop  , no rub, nl s1 s2                           - JVD- none , edema- none, stasis changes- none,  varices- none           Lung- + few crackles, unlabored at rest, wheeze- none, cough + raspy , dullness-none, rub- none           Chest wall- pacemaker Abd-  Br/ Gen/ Rectal- Not done, not indicated Extrem- cyanosis- none, clubbing, none, atrophy- none, strength- nl Neuro- grossly intact to observation

## 2015-04-17 ENCOUNTER — Encounter: Payer: Self-pay | Admitting: Cardiology

## 2015-04-24 ENCOUNTER — Ambulatory Visit: Payer: Medicare Other

## 2015-04-24 ENCOUNTER — Other Ambulatory Visit (HOSPITAL_BASED_OUTPATIENT_CLINIC_OR_DEPARTMENT_OTHER): Payer: Medicare Other

## 2015-04-24 ENCOUNTER — Ambulatory Visit (HOSPITAL_BASED_OUTPATIENT_CLINIC_OR_DEPARTMENT_OTHER): Payer: Medicare Other

## 2015-04-24 ENCOUNTER — Other Ambulatory Visit: Payer: Self-pay | Admitting: *Deleted

## 2015-04-24 ENCOUNTER — Other Ambulatory Visit: Payer: Self-pay | Admitting: Internal Medicine

## 2015-04-24 ENCOUNTER — Other Ambulatory Visit: Payer: Self-pay | Admitting: Oncology

## 2015-04-24 VITALS — BP 140/82 | HR 83 | Temp 98.5°F | Resp 20

## 2015-04-24 DIAGNOSIS — Z95828 Presence of other vascular implants and grafts: Secondary | ICD-10-CM

## 2015-04-24 DIAGNOSIS — D61818 Other pancytopenia: Secondary | ICD-10-CM | POA: Diagnosis not present

## 2015-04-24 DIAGNOSIS — D469 Myelodysplastic syndrome, unspecified: Secondary | ICD-10-CM | POA: Diagnosis present

## 2015-04-24 DIAGNOSIS — D649 Anemia, unspecified: Secondary | ICD-10-CM

## 2015-04-24 LAB — CBC WITH DIFFERENTIAL/PLATELET
BASO%: 0 % (ref 0.0–2.0)
Basophils Absolute: 0 10*3/uL (ref 0.0–0.1)
EOS%: 1 % (ref 0.0–7.0)
Eosinophils Absolute: 0 10*3/uL (ref 0.0–0.5)
HCT: 24.1 % — ABNORMAL LOW (ref 38.4–49.9)
HGB: 8.3 g/dL — ABNORMAL LOW (ref 13.0–17.1)
LYMPH%: 67.7 % — AB (ref 14.0–49.0)
MCH: 34.3 pg — ABNORMAL HIGH (ref 27.2–33.4)
MCHC: 34.4 g/dL (ref 32.0–36.0)
MCV: 99.6 fL — AB (ref 79.3–98.0)
MONO#: 0 10*3/uL — ABNORMAL LOW (ref 0.1–0.9)
MONO%: 1 % (ref 0.0–14.0)
NEUT%: 30.3 % — ABNORMAL LOW (ref 39.0–75.0)
NEUTROS ABS: 0.3 10*3/uL — AB (ref 1.5–6.5)
Platelets: 64 10*3/uL — ABNORMAL LOW (ref 140–400)
RBC: 2.42 10*6/uL — AB (ref 4.20–5.82)
RDW: 23.7 % — ABNORMAL HIGH (ref 11.0–14.6)
WBC: 1 10*3/uL — AB (ref 4.0–10.3)
lymph#: 0.7 10*3/uL — ABNORMAL LOW (ref 0.9–3.3)

## 2015-04-24 LAB — PREPARE RBC (CROSSMATCH)

## 2015-04-24 MED ORDER — HEPARIN SOD (PORK) LOCK FLUSH 100 UNIT/ML IV SOLN
250.0000 [IU] | INTRAVENOUS | Status: DC | PRN
  Filled 2015-04-24: qty 5

## 2015-04-24 MED ORDER — HEPARIN SOD (PORK) LOCK FLUSH 100 UNIT/ML IV SOLN
500.0000 [IU] | Freq: Once | INTRAVENOUS | Status: DC
  Filled 2015-04-24: qty 5

## 2015-04-24 MED ORDER — SODIUM CHLORIDE 0.9% FLUSH
10.0000 mL | INTRAVENOUS | Status: DC | PRN
  Administered 2015-04-24: 10 mL via INTRAVENOUS
  Filled 2015-04-24: qty 10

## 2015-04-24 MED ORDER — ACETAMINOPHEN 325 MG PO TABS: 650.0000 mg | ORAL_TABLET | Freq: Once | ORAL | Status: DC

## 2015-04-24 MED ORDER — SODIUM CHLORIDE 0.9% FLUSH
10.0000 mL | INTRAVENOUS | Status: AC | PRN
  Administered 2015-04-24: 10 mL
  Filled 2015-04-24: qty 10

## 2015-04-24 MED ORDER — HEPARIN SOD (PORK) LOCK FLUSH 100 UNIT/ML IV SOLN
500.0000 [IU] | Freq: Once | INTRAVENOUS | Status: AC
  Administered 2015-04-24: 500 [IU] via INTRAVENOUS
  Filled 2015-04-24: qty 5

## 2015-04-24 MED ORDER — SODIUM CHLORIDE 0.9 % IV SOLN
250.0000 mL | Freq: Once | INTRAVENOUS | Status: AC
  Administered 2015-04-24: 250 mL via INTRAVENOUS

## 2015-04-24 MED ORDER — DIPHENHYDRAMINE HCL 25 MG PO CAPS: 25.0000 mg | ORAL_CAPSULE | Freq: Once | ORAL | Status: DC

## 2015-04-24 NOTE — Patient Instructions (Signed)

## 2015-04-24 NOTE — Patient Instructions (Signed)

## 2015-04-24 NOTE — Progress Notes (Signed)
Pt took own premeds before blood-Tylenol 650 mg & benadryl 25 mg po

## 2015-04-25 LAB — TYPE AND SCREEN
ABO/RH(D): A POS
ANTIBODY SCREEN: NEGATIVE
Unit division: 0

## 2015-04-29 ENCOUNTER — Ambulatory Visit (HOSPITAL_COMMUNITY)
Admission: RE | Admit: 2015-04-29 | Discharge: 2015-04-29 | Disposition: A | Discharge status: Home / Self Care | Payer: Medicare Other | Source: Ambulatory Visit | Attending: Oncology | Admitting: Oncology

## 2015-04-29 DIAGNOSIS — D469 Myelodysplastic syndrome, unspecified: Secondary | ICD-10-CM | POA: Insufficient documentation

## 2015-04-29 DIAGNOSIS — D649 Anemia, unspecified: Secondary | ICD-10-CM | POA: Insufficient documentation

## 2015-05-06 ENCOUNTER — Other Ambulatory Visit: Payer: Self-pay | Admitting: Adult Health

## 2015-05-08 ENCOUNTER — Ambulatory Visit: Payer: Medicare Other

## 2015-05-08 ENCOUNTER — Other Ambulatory Visit (HOSPITAL_BASED_OUTPATIENT_CLINIC_OR_DEPARTMENT_OTHER): Payer: Medicare Other

## 2015-05-08 ENCOUNTER — Other Ambulatory Visit: Payer: Self-pay | Admitting: *Deleted

## 2015-05-08 ENCOUNTER — Encounter: Payer: Self-pay | Admitting: *Deleted

## 2015-05-08 ENCOUNTER — Ambulatory Visit (HOSPITAL_BASED_OUTPATIENT_CLINIC_OR_DEPARTMENT_OTHER): Payer: Medicare Other

## 2015-05-08 VITALS — BP 116/65 | HR 80 | Temp 97.8°F | Resp 18

## 2015-05-08 DIAGNOSIS — D469 Myelodysplastic syndrome, unspecified: Secondary | ICD-10-CM | POA: Diagnosis present

## 2015-05-08 DIAGNOSIS — D649 Anemia, unspecified: Secondary | ICD-10-CM

## 2015-05-08 DIAGNOSIS — D61818 Other pancytopenia: Secondary | ICD-10-CM

## 2015-05-08 DIAGNOSIS — Z95828 Presence of other vascular implants and grafts: Secondary | ICD-10-CM

## 2015-05-08 LAB — CBC WITH DIFFERENTIAL/PLATELET
BASO%: 0 % (ref 0.0–2.0)
Basophils Absolute: 0 10*3/uL (ref 0.0–0.1)
EOS ABS: 0 10*3/uL (ref 0.0–0.5)
EOS%: 0 % (ref 0.0–7.0)
HCT: 24.9 % — ABNORMAL LOW (ref 38.4–49.9)
HGB: 8.7 g/dL — ABNORMAL LOW (ref 13.0–17.1)
LYMPH%: 59.1 % — AB (ref 14.0–49.0)
MCH: 34.9 pg — ABNORMAL HIGH (ref 27.2–33.4)
MCHC: 34.9 g/dL (ref 32.0–36.0)
MCV: 100 fL — ABNORMAL HIGH (ref 79.3–98.0)
MONO#: 0 10*3/uL — ABNORMAL LOW (ref 0.1–0.9)
MONO%: 0 % (ref 0.0–14.0)
NEUT%: 40.9 % (ref 39.0–75.0)
NEUTROS ABS: 0.4 10*3/uL — AB (ref 1.5–6.5)
NRBC: 2 % — AB (ref 0–0)
Platelets: 60 10*3/uL — ABNORMAL LOW (ref 140–400)
RBC: 2.49 10*6/uL — AB (ref 4.20–5.82)
RDW: 23.7 % — AB (ref 11.0–14.6)
WBC: 0.9 10*3/uL — AB (ref 4.0–10.3)
lymph#: 0.6 10*3/uL — ABNORMAL LOW (ref 0.9–3.3)

## 2015-05-08 LAB — PREPARE RBC (CROSSMATCH)

## 2015-05-08 MED ORDER — DIPHENHYDRAMINE HCL 25 MG PO CAPS
ORAL_CAPSULE | ORAL | Status: AC
  Filled 2015-05-08: qty 1

## 2015-05-08 MED ORDER — SODIUM CHLORIDE 0.9% FLUSH
10.0000 mL | INTRAVENOUS | Status: DC | PRN
  Administered 2015-05-08: 10 mL via INTRAVENOUS
  Filled 2015-05-08: qty 10

## 2015-05-08 MED ORDER — ACETAMINOPHEN 325 MG PO TABS: 650.0000 mg | ORAL_TABLET | Freq: Once | ORAL | Status: DC

## 2015-05-08 MED ORDER — SODIUM CHLORIDE 0.9% FLUSH
10.0000 mL | INTRAVENOUS | Status: AC | PRN
  Administered 2015-05-08: 10 mL
  Filled 2015-05-08: qty 10

## 2015-05-08 MED ORDER — HEPARIN SOD (PORK) LOCK FLUSH 100 UNIT/ML IV SOLN
250.0000 [IU] | INTRAVENOUS | Status: DC | PRN
  Filled 2015-05-08: qty 5

## 2015-05-08 MED ORDER — DIPHENHYDRAMINE HCL 25 MG PO CAPS: 25.0000 mg | ORAL_CAPSULE | Freq: Once | ORAL | Status: DC

## 2015-05-08 MED ORDER — HEPARIN SOD (PORK) LOCK FLUSH 100 UNIT/ML IV SOLN
500.0000 [IU] | Freq: Once | INTRAVENOUS | Status: AC
  Administered 2015-05-08: 500 [IU] via INTRAVENOUS
  Filled 2015-05-08: qty 5

## 2015-05-08 MED ORDER — SODIUM CHLORIDE 0.9 % IV SOLN
250.0000 mL | Freq: Once | INTRAVENOUS | Status: AC
  Administered 2015-05-08: 250 mL via INTRAVENOUS

## 2015-05-08 MED ORDER — ACETAMINOPHEN 325 MG PO TABS
ORAL_TABLET | ORAL | Status: AC
  Filled 2015-05-08: qty 2

## 2015-05-08 NOTE — Patient Instructions (Signed)

## 2015-05-08 NOTE — Progress Notes (Signed)
Patient took 650 mg of Tylenol and 25mg  of Benadryl at 1025 from his own medication supply - he stated this is MD approved by MD [name] and he "always does it this way".

## 2015-05-08 NOTE — Patient Instructions (Signed)

## 2015-05-09 LAB — TYPE AND SCREEN
ABO/RH(D): A POS
Antibody Screen: NEGATIVE
Unit division: 0

## 2015-05-13 ENCOUNTER — Other Ambulatory Visit (INDEPENDENT_AMBULATORY_CARE_PROVIDER_SITE_OTHER): Payer: Medicare Other

## 2015-05-13 ENCOUNTER — Encounter: Payer: Self-pay | Admitting: Internal Medicine

## 2015-05-13 ENCOUNTER — Ambulatory Visit (INDEPENDENT_AMBULATORY_CARE_PROVIDER_SITE_OTHER)
Admission: RE | Admit: 2015-05-13 | Discharge: 2015-05-13 | Disposition: A | Discharge status: Home / Self Care | Payer: Medicare Other | Source: Ambulatory Visit | Attending: Internal Medicine | Admitting: Internal Medicine

## 2015-05-13 ENCOUNTER — Ambulatory Visit (INDEPENDENT_AMBULATORY_CARE_PROVIDER_SITE_OTHER): Payer: Medicare Other | Admitting: Internal Medicine

## 2015-05-13 VITALS — BP 118/74 | HR 106 | Ht 71.0 in | Wt 245.0 lb

## 2015-05-13 DIAGNOSIS — J45991 Cough variant asthma: Secondary | ICD-10-CM | POA: Diagnosis not present

## 2015-05-13 DIAGNOSIS — R0600 Dyspnea, unspecified: Secondary | ICD-10-CM | POA: Insufficient documentation

## 2015-05-13 DIAGNOSIS — J440 Chronic obstructive pulmonary disease with acute lower respiratory infection: Secondary | ICD-10-CM | POA: Diagnosis not present

## 2015-05-13 LAB — BASIC METABOLIC PANEL
BUN: 14 mg/dL (ref 6–23)
CO2: 27 mEq/L (ref 19–32)
Calcium: 9.9 mg/dL (ref 8.4–10.5)
Chloride: 99 mEq/L (ref 96–112)
Creatinine, Ser: 0.82 mg/dL (ref 0.40–1.50)
GFR: 95.91 mL/min (ref >60.00)
Glucose, Bld: 196 mg/dL — ABNORMAL HIGH (ref 70–99)
Potassium: 4 mEq/L (ref 3.5–5.1)
SODIUM: 138 meq/L (ref 135–145)

## 2015-05-13 LAB — CBC WITH DIFFERENTIAL/PLATELET
BASOS ABS: 0 10*3/uL (ref 0.0–0.1)
Basophils Relative: 0.4 % (ref 0.0–3.0)
EOS ABS: 0 10*3/uL (ref 0.0–0.7)
Eosinophils Relative: 0.9 % (ref 0.0–5.0)
HEMATOCRIT: 27.8 % — AB (ref 39.0–52.0)
Hemoglobin: 9.6 g/dL — ABNORMAL LOW (ref 13.0–17.0)
LYMPHS ABS: 1 10*3/uL (ref 0.7–4.0)
MCHC: 34.3 g/dL (ref 30.0–36.0)
MCV: 100.7 fl — AB (ref 78.0–100.0)
MONOS PCT: 0.6 % — AB (ref 3.0–12.0)
Monocytes Absolute: 0 10*3/uL — ABNORMAL LOW (ref 0.1–1.0)
NEUTROS PCT: 31.9 % — AB (ref 43.0–77.0)
Neutro Abs: 0.5 10*3/uL — ABNORMAL LOW (ref 1.4–7.7)
Platelets: 74 10*3/uL — ABNORMAL LOW (ref 150.0–400.0)
RBC: 2.76 Mil/uL — AB (ref 4.22–5.81)
RDW: 27.9 % — ABNORMAL HIGH (ref 11.5–15.5)
WBC: 1.5 10*3/uL — CL (ref 4.0–10.5)

## 2015-05-13 LAB — BRAIN NATRIURETIC PEPTIDE: PRO B NATRI PEPTIDE: 271 pg/mL — AB (ref 0.0–100.0)

## 2015-05-13 LAB — TSH: TSH: 2.7 u[IU]/mL (ref 0.35–4.50)

## 2015-05-13 MED ORDER — MOMETASONE FURO-FORMOTEROL FUM 200-5 MCG/ACT IN AERO: INHALATION_SPRAY | RESPIRATORY_TRACT | Status: DC

## 2015-05-13 MED ORDER — METHYLPREDNISOLONE ACETATE 80 MG/ML IJ SUSP
120.0000 mg | Freq: Once | INTRAMUSCULAR | Status: AC
  Administered 2015-05-13: 120 mg via INTRAMUSCULAR

## 2015-05-13 NOTE — Patient Instructions (Addendum)
Walk slower if getting short of breath with activity  Change trazadone 150 one whole at bedtime Restart dulera 200 2 every 12 hours  For drainage / throat tickle try take CHLORPHENIRAMINE  4 mg - take one every 4 hours as needed - available over the counter- may cause drowsiness so start with just a bedtime dose or two and see how you tolerate it before trying in daytime    Work on inhaler technique:  relax and gently blow all the way out then take a nice smooth deep breath back in, triggering the inhaler at same time you start breathing in.  Hold for up to 5 seconds if you can. Blow out thru nose. Rinse and gargle with water when done  Plan A = Automatic = dulera 200 Take 2 puffs first thing in am and then another 2 puffs about 12 hours later.      Plan B = Backup Only use your albuterol (proair) as a rescue medication to be used if you can't catch your breath by resting or doing a relaxed purse lip breathing pattern.  - The less you use it, the better it will work when you need it. - Ok to use the inhaler up to 2 puffs  every 4 hours if you must but call for appointment if use goes up over your usual need - Don't leave home without it !!  (think of it like the spare tire for your car)   Plan C = Crisis/ Contringency - only use your albuterol nebulizer if you first try Plan B and it fails to help > ok to use the nebulizer up to every 4 hours but if start needing it regularly call for immediate appointment  Depomedrol 120 today   Please remember to go to the lab and x-ray department downstairs for your tests - we will call you with the results when they are available.  Please schedule a follow up office visit in 4 weeks, sooner if needed  Add Pfts ordered

## 2015-05-13 NOTE — Assessment & Plan Note (Signed)
05/13/2015   Walked RA  2 laps @ 185 ft each stopped due to  Sob/ sore legs relatively fast pace, sats 92% at lowest  Walking twice the pace of normal chronically ill 82 year old obese male, rec better pacing / wt loss/ no further w/u needed for this complaint

## 2015-05-13 NOTE — Progress Notes (Signed)
Subjective:    Patient ID: [name], male    DOB: [birth year], 82 y.o.   MRN: [id number]  History of Present Illness  82 year old white male, former smoker( Quit date 31), with mild obesity complicated by aodm, Lymphoma s/p chemo ([facility]) and tendency to peripheral edema that is felt to be dependent and related to obesity/ venous insuff.Here for Follow up hospital admission visit.   Admit date: 01/18/2015 Discharge date: 01/22/2015  Discharge Diagnoses:  Principal Problem:  Sepsis due to pneumonia [facility]) Active Problems:  Lobar pneumonia, unspecified organism ([place])  Lactic acidosis  Pancytopenia ([facility])  MDS (myelodysplastic syndrome) (H[facility])  Neutropenia (HCC)  Thrombocytopenia (HCC)  Anemia of chronic disease  Pacemaker  Chronic diastolic CHF (congestive heart failure) ([place])     03/05/2015 Follow up : DCHF , Cough variant asthma , DM  Pt returns for a 3 week follow up .  Says overall he is doing better. Recovering from PNA.  Clinically is improving with improved energy.  Working with PT at home.  Now able to walk with cane.  Minimal cough and congestion . No fever or discolored mucus.  No hemoptysis .  No increased leg swelling or orthopnea  Needs updated med calendar list, reviewed with pt and daughter.  Would like a refill of xanax, uses it at night to help with sleep. On low dose  rec No changes  04/12/15 Dr Donalee Citrin Script sent for doxycycline antibiotic Ok to take a probiotic with the antibiotic Script sent changing Dulera to Symbicort maintenance inhaler Script sent changing nasonex to flonase nasal spray     05/13/2015 extended  f/u ov/Kenitra Leventhal re: sob /cough ? Asthma related  Chief Complaint  Patient presents with  . Follow-up    Breathing is progressively worse. He states he gets winded with very minimal exertion. Cough has not improved since the last visit. He still has occ wheeze.   cough is sporadic more often sitting in chair / and  w/in 10 min at hs x months with minimal mucoid sputum produced and no am excess  Breathing worse p change to symbicort from dulera but turns out at baseline using the neb bid, not prn as per med calendar  Doe = MMRC3 = can't walk 100 yards even at a slow pace at a flat grade s stopping due to sob  (not reproduced on walk test this ov) Much better after tx of prbc's vs prior. Using saba multiple forms hfa and neb   No obvious patterns in  day to day or daytime variability or assoc  p or chest tightness, subjective wheeze or overt sinus or hb symptoms. No unusual exp hx or h/o childhood pna/ asthma or knowledge of premature birth.   Also denies any obvious fluctuation of symptoms with weather or environmental changes or other aggravating or alleviating factors except as outlined above   Current Medications, Allergies, Complete Past Medical History, Past Surgical History, Family History, and Social History were reviewed in [employer] record.  ROS  The following are not active complaints unless bolded sore throat, dysphagia, dental problems, itching, sneezing,  nasal congestion or excess/ purulent secretions, ear ache,   fever, chills, sweats, unintended wt loss, classically pleuritic or exertional cp, hemoptysis,  orthopnea pnd or leg swelling, presyncope, palpitations, abdominal pain, anorexia, nausea, vomiting, diarrhea  or change in bowel or bladder habits, change in stools or urine, dysuria,hematuria,  rash, arthralgias, visual complaints, headache, numbness, weakness or ataxia or problems  with walking or coordination,  change in mood/affect or memory.                     Past Medical History:  LYMPHOMA NEC, MLIG, INGUINAL/LOWER LIMB (ICD-202.85) dx 04/2005....[facility]  - last chemo 10/2006 -repeat CT/ABD CT 11/18/07--no reccurence  - Pancytopenia August 06, 2009 > refer back to [facility] > aranesp rx Jan 2012  RHINITIS, ALLERGIC NOS (ICD-477.9)   HYPERLIPIDEMIA NEC/NOS (ICD-272.4)  EXOGENOUS OBESITY (ICD-278.00)  - ideal body weight less than 186  AODM onset 2012 with freq pred for airways issues -HgA1C 5.5 12/12 >metformin decreased 500mg  1/2 Twice daily  Vitamin D Deficiency- level 29>>44 (4/9//10)  Left Hip pain onset around 6/09............................................................................ [facility]  - MRI 11/23/2007 c/w L1-2 bulging disc indents the thecal sac with foraminal stenosis  HEALTH MAINTENANCE..........................................................................................[facility]Wert  - Td 10/2005  - Pneumovax 10/07 second shot  - CPX 08/20/2010  --colon 02/2005 -int/extern. hems (repeat q7y)  Complex med regimen  --Meds reviewed with pt education and computerized med calendar completed/adjusted. November 08, 2008 , August 21, 2009 updated 02/19/2011 , 10/23/2011 , 09/19/2013  Syncope...........................................................................................................Marland KitchenHochrein  - Mobitz II AV block s/p Medtronic pacemaker placement 12/11/2008  D[facility]at[facility].....................................................................................................[facility]  - Pruritic rash 04/2009 > tried lotrisone, referred to [facility] August 06, 2009  Memory impairment , MMSE 27/30 8/25>refer to neuro   Family History:  mother had diabetes   Social History:  Patient states former smoker.  quit smoking in 1970  No ETOH  Retired       Objective:   Physical Exam   amb obese wm nad/ uses cane / very easily confused with details of care   Wt Readings from Last 3 Encounters:  05/13/15 245 lb (111.131 kg)  04/12/15 242 lb 12.8 oz (110.133 kg)  03/27/15 239 lb 6.4 oz (108.591 kg)    Vital signs reviewed   Physical Exam:  General- Elderly gentleman,  No distress,  A&Ox3, pleasant ENT: No sinus tenderness, TM clear, pale nasal mucosa, no oral exudate,no post  nasal drip, no LAN Cardiac: S1, S2, regular rate and rhythm, no murmur Chest: No wheeze/   no accessory muscle use, diminished bs in bases - pseudowheeze only (w/in 4 h of last saba )  Abd.: Soft Non-tender Ext: trace sym pitting edema bilateral lower extremities Neuro:  Alert/ nl sensorium, no motor def Skin: No rashes, warm and dry Psych: normal mood and behavior    CXR PA and Lateral:   05/13/2015 :    I personally reviewed images and agree with radiology impression as follows:   Tip of new right IJ catheter projects the lower superior vena cava. Negative for pneumothorax. Cardiomegaly without edema.  Labs ordered/ reviewed:      Chemistry      Component Value Date/Time   NA 138 05/13/2015 1043   NA 137 01/05/2013 1501   K 4.0 05/13/2015 1043   K 3.8 01/05/2013 1501   CL 99 05/13/2015 1043   CL 99 06/08/2012 1257   CO2 27 05/13/2015 1043   CO2 26 01/05/2013 1501   BUN 14 05/13/2015 1043   BUN 13.9 01/05/2013 1501   CREATININE 0.82 05/13/2015 1043   CREATININE 0.8 01/05/2013 1501      Component Value Date/Time   CALCIUM 9.9 05/13/2015 1043   CALCIUM 10.1 01/05/2013 1501   ALKPHOS 77 01/24/2015 1148   ALKPHOS 48 08/10/2012 1117   AST 31 01/24/2015 1148   AST 17 08/10/2012 1117   ALT 50 01/24/2015 1148  ALT 14 08/10/2012 1117   BILITOT 0.6 01/24/2015 1148   BILITOT 0.82 08/10/2012 1117        Lab Results  Component Value Date   WBC 1.5 Repeated and verified X2.* 05/13/2015   HGB 9.6* 05/13/2015   HCT 27.8* 05/13/2015   MCV 100.7* 05/13/2015   PLT 74.0* 05/13/2015        Lab Results  Component Value Date   TSH 2.70 05/13/2015     Lab Results  Component Value Date   PROBNP 271.0* 05/13/2015                     Assessment & Plan:

## 2015-05-13 NOTE — Assessment & Plan Note (Addendum)
Not clear his sob/ cough have anything to do with asthma or why he's so much worse on symb 160 vs dulera 200; in fact, Symptoms are markedly disproportionate to objective findings and not clear this is a lung problem but pt does appear to have difficult airway management issues. DDX of  difficult airways management almost all start with A and  include Adherence, Ace Inhibitors, Acid Reflux, Active Sinus Disease, Alpha 1 Antitripsin deficiency, Anxiety masquerading as Airways dz,  ABPA,  Allergy(esp in young), Aspiration (esp in elderly), Adverse effects of meds,  Active smokers, A bunch of PE's (a small clot burden can't cause this syndrome unless there is already severe underlying pulm or vascular dz with poor reserve) plus two Bs  = Bronchiectasis and Beta blocker use..and one C= CHF  Adherence is always the initial "prime suspect" and is a multilayered concern that requires a "trust but verify" approach in every patient - starting with knowing how to use medications, especially inhalers, correctly, keeping up with refills and understanding the fundamental difference between maintenance and prns vs those medications only taken for a very short course and then stopped and not refilled.  - The proper method of use, as well as anticipated side effects, of a metered-dose inhaler are discussed and demonstrated to the patient. Improved effectiveness after extensive coaching during this visit to a level of approximately 75 % from a baseline of 50 %  - Formulary restrictions will be an ongoing challenge for the forseable future and I would be happy to pick an alternative if the pt will first  provide me a list of them but pt  will need to return here for training for any new device that is required eg dpi vs hfa vs respimat.    In meantime we can always provide samples so the patient never runs out of any needed respiratory medications (dulera 200 x 2 today)   ? Acid (or non-acid) GERD > always difficult to  exclude as up to 75% of pts in some series report no assoc GI/ Heartburn symptoms> rec continue max (24h)  acid suppression and diet restrictions/ reviewed     ? Allergies/ asthma > depomedrol 120 Im but no further systemic steroids/should be well covered with dulera 200 2bid and less dependent on hfa (samples x one month of dulera 200   ? Anxiety > usually at the bottom of this list of usual suspects but should be much higher on this pt's based on H and P and note already on psychotropics (xanax)   ? CHF /diastolic dysfunction exac by anemia or at least very tx dep more so that one would anticipate- keep up with HCT/ dosing furosemide reviewed.    I had an extended discussion with the patient/daughter  reviewing all relevant studies completed to date and  lasting 25 minutes of a 40 minute extended f/u visit  To address refractory symptoms   Each maintenance medication was reviewed in detail including most importantly the difference between maintenance and prns and under what circumstances the prns are to be triggered using an action plan format that is not reflected in the computer generated alphabetically organized AVS but trather by a customized med calendar that reflects the AVS meds with confirmed 100% correlation.   Please see instructions for details which were reviewed in writing and the patient given a copy highlighting the part that I personally wrote and discussed at today's ov.

## 2015-05-14 ENCOUNTER — Other Ambulatory Visit: Payer: Self-pay | Admitting: Internal Medicine

## 2015-05-14 DIAGNOSIS — R0600 Dyspnea, unspecified: Secondary | ICD-10-CM

## 2015-05-14 NOTE — Progress Notes (Signed)
Quick Note:  Spoke with pt and notified of results per Dr. Wert. Pt verbalized understanding and denied any questions.  ______ 

## 2015-05-21 ENCOUNTER — Other Ambulatory Visit: Payer: Self-pay | Admitting: Internal Medicine

## 2015-05-21 ENCOUNTER — Encounter: Payer: Self-pay | Admitting: *Deleted

## 2015-05-21 DIAGNOSIS — D61818 Other pancytopenia: Secondary | ICD-10-CM

## 2015-05-22 ENCOUNTER — Other Ambulatory Visit: Payer: Self-pay

## 2015-05-22 ENCOUNTER — Ambulatory Visit: Payer: Medicare Other

## 2015-05-22 ENCOUNTER — Ambulatory Visit (HOSPITAL_BASED_OUTPATIENT_CLINIC_OR_DEPARTMENT_OTHER): Payer: Medicare Other

## 2015-05-22 ENCOUNTER — Telehealth: Payer: Self-pay | Admitting: Oncology

## 2015-05-22 ENCOUNTER — Other Ambulatory Visit (HOSPITAL_BASED_OUTPATIENT_CLINIC_OR_DEPARTMENT_OTHER): Payer: Medicare Other

## 2015-05-22 ENCOUNTER — Ambulatory Visit (HOSPITAL_BASED_OUTPATIENT_CLINIC_OR_DEPARTMENT_OTHER): Payer: Medicare Other | Admitting: Oncology

## 2015-05-22 VITALS — BP 129/63 | HR 102 | Temp 97.6°F | Resp 18 | Ht 71.0 in | Wt 246.5 lb

## 2015-05-22 VITALS — BP 119/88 | HR 83 | Temp 97.8°F | Resp 18

## 2015-05-22 DIAGNOSIS — D709 Neutropenia, unspecified: Secondary | ICD-10-CM | POA: Diagnosis not present

## 2015-05-22 DIAGNOSIS — E538 Deficiency of other specified B group vitamins: Secondary | ICD-10-CM

## 2015-05-22 DIAGNOSIS — D469 Myelodysplastic syndrome, unspecified: Secondary | ICD-10-CM

## 2015-05-22 DIAGNOSIS — Z8579 Personal history of other malignant neoplasms of lymphoid, hematopoietic and related tissues: Secondary | ICD-10-CM | POA: Diagnosis not present

## 2015-05-22 DIAGNOSIS — D472 Monoclonal gammopathy: Secondary | ICD-10-CM

## 2015-05-22 DIAGNOSIS — D696 Thrombocytopenia, unspecified: Secondary | ICD-10-CM | POA: Diagnosis not present

## 2015-05-22 DIAGNOSIS — Z95828 Presence of other vascular implants and grafts: Secondary | ICD-10-CM | POA: Insufficient documentation

## 2015-05-22 DIAGNOSIS — D61818 Other pancytopenia: Secondary | ICD-10-CM

## 2015-05-22 LAB — CBC WITH DIFFERENTIAL/PLATELET
BASO%: 0.1 % (ref 0.0–2.0)
BASOS ABS: 0 10*3/uL (ref 0.0–0.1)
EOS%: 0.7 % (ref 0.0–7.0)
Eosinophils Absolute: 0 10*3/uL (ref 0.0–0.5)
HEMATOCRIT: 25.5 % — AB (ref 38.4–49.9)
HGB: 8.6 g/dL — ABNORMAL LOW (ref 13.0–17.1)
LYMPH%: 61.1 % — AB (ref 14.0–49.0)
MCH: 35.1 pg — AB (ref 27.2–33.4)
MCHC: 33.9 g/dL (ref 32.0–36.0)
MCV: 103.6 fL — ABNORMAL HIGH (ref 79.3–98.0)
MONO#: 0 10*3/uL — AB (ref 0.1–0.9)
MONO%: 0.6 % (ref 0.0–14.0)
NEUT#: 0.4 10*3/uL — CL (ref 1.5–6.5)
NEUT%: 37.5 % — AB (ref 39.0–75.0)
PLATELETS: 67 10*3/uL — AB (ref 140–400)
RBC: 2.46 10*6/uL — AB (ref 4.20–5.82)
RDW: 27.1 % — ABNORMAL HIGH (ref 11.0–14.6)
WBC: 1 10*3/uL — ABNORMAL LOW (ref 4.0–10.3)
lymph#: 0.6 10*3/uL — ABNORMAL LOW (ref 0.9–3.3)
nRBC: 0 % (ref 0–0)

## 2015-05-22 LAB — PREPARE RBC (CROSSMATCH)

## 2015-05-22 MED ORDER — SODIUM CHLORIDE 0.9% FLUSH
3.0000 mL | INTRAVENOUS | Status: AC | PRN
  Administered 2015-05-22: 3 mL
  Filled 2015-05-22: qty 10

## 2015-05-22 MED ORDER — DIPHENHYDRAMINE HCL 25 MG PO CAPS: 25.0000 mg | ORAL_CAPSULE | Freq: Once | ORAL | Status: DC

## 2015-05-22 MED ORDER — FUROSEMIDE 10 MG/ML IJ SOLN
INTRAMUSCULAR | Status: AC
  Filled 2015-05-22: qty 2

## 2015-05-22 MED ORDER — SODIUM CHLORIDE 0.9 % IJ SOLN
10.0000 mL | INTRAMUSCULAR | Status: DC | PRN
  Administered 2015-05-22: 10 mL via INTRAVENOUS
  Filled 2015-05-22: qty 10

## 2015-05-22 MED ORDER — FUROSEMIDE 10 MG/ML IJ SOLN
20.0000 mg | Freq: Once | INTRAMUSCULAR | Status: AC
  Administered 2015-05-22: 20 mg via INTRAVENOUS

## 2015-05-22 MED ORDER — HEPARIN SOD (PORK) LOCK FLUSH 100 UNIT/ML IV SOLN
250.0000 [IU] | INTRAVENOUS | Status: AC | PRN
  Administered 2015-05-22: 250 [IU]
  Filled 2015-05-22: qty 5

## 2015-05-22 MED ORDER — ACETAMINOPHEN 325 MG PO TABS: 650.0000 mg | ORAL_TABLET | Freq: Once | ORAL | Status: DC

## 2015-05-22 NOTE — Telephone Encounter (Signed)
Gave pt appt & avs °

## 2015-05-22 NOTE — Progress Notes (Signed)
Hematology and Oncology Follow Up Visit  [name] [id number] 06-Aug-1932 82 y.o. 05/22/2015 9:53 AM  CC: [name]. Victor Novas, MD, FCCP    Principle Diagnosis: A 82 year old gentleman with the following diagnoses:   1. Follicular lymphoma, grade 3, stage III, diagnosed 2007. Continued to be in remission. 2. Pancytopenia associated with mild neutropenia and macrocytosis possibly indicating early myelodysplasia. Bone marrow  was repeated on 06/22/2012 confirmed the evidence of myelodysplastic syndrome 3. Monoclonal gammopathy of undetermined significance.  Prior Therapy:  1. Status post CHOP and rituximab.  He had complete response to therapy concluded in 2007.  No further imaging has been indicated at this time. 2. Status post bone marrow biopsy in April 2011, did not really show any evidence of myelodysplasia or metastatic lymphoma at this time.  Current therapy:  He also has been receiving intermittent PRBC transfusions to keep Hgb above 9.  Interim History: [name] presents today for a followup visit with his daughter. Since his last visit, he reports feeling reasonably fair. He is limited in his mobility and his performance status. He is ambulating short distances without any falls or syncope. He does report exertional dyspnea and occasional cough. He denied any hemoptysis or wheezing. He does not report any bleeding episodes such as epistaxis, hematochezia or melena. He denied any recurrent infections or hospitalizations.   He did not report any headaches or change in his vision. Does not report any seizures or confusion. He does not report any palpitation or orthopnea. Has not reported any edema. Does not report any wheezing but still has exertional dyspnea.  He does not report any lymphadenopathy or petechiae. Has not reported any skin rashes or genitourinary complaints. Remainder of review of systems unremarkable.   Medications: I have reviewed the patient's current  medications Current Outpatient Prescriptions  Medication Sig Dispense Refill  . acetaminophen (TYLENOL) 500 MG tablet Take 500 mg by mouth every 6 (six) hours as needed for mild pain or headache. Reported on 03/05/2015    . albuterol (PROVENTIL HFA;VENTOLIN HFA) 108 (90 BASE) MCG/ACT inhaler Inhale 1-2 puffs into the lungs every 4 (four) hours as needed for wheezing or shortness of breath. (Patient taking differently: Inhale 2 puffs into the lungs every 4 (four) hours as needed for wheezing or shortness of breath. ) 8 g 5  . albuterol (PROVENTIL) (2.5 MG/3ML) 0.083% nebulizer solution Take 3 mLs (2.5 mg total) by nebulization every 4 (four) hours as needed for wheezing or shortness of breath. 75 mL 12  . ALPRAZolam (XANAX) 0.25 MG tablet Take 1 tablet (0.25 mg total) by mouth at bedtime as needed for anxiety. (Patient taking differently: Take 0.25 mg by mouth at bedtime as needed for anxiety (and insomnia). ) 30 tablet 0  . aspirin 81 MG tablet Take 81 mg by mouth every morning. (hold if bleeding)    . calcium-vitamin D (OS-CAL 500 + D) 500-200 MG-UNIT per tablet Take 1 tablet by mouth 2 (two) times daily.     . cetirizine (ZYRTEC) 10 MG tablet Take 10 mg by mouth at bedtime.    . Cholecalciferol (VITAMIN D) 1000 UNITS capsule Take 1,000 Units by mouth 2 (two) times daily.      [name] dextromethorphan-guaiFENesin (MUCINEX DM) 30-600 MG 12hr tablet Take 1 tablet by mouth 2 (two) times daily as needed for cough.     . docusate sodium (COLACE) 100 MG capsule Take 100 mg by mouth daily as needed for moderate constipation.     . famotidine (  PEPCID) 20 MG tablet Take 20 mg by mouth at bedtime.     . finasteride (PROSCAR) 5 MG tablet TAKE 1 TABLET BY MOUTH EVERY MORNING 30 tablet 0  . fluticasone (FLONASE) 50 MCG/ACT nasal spray 1-2 puffs each nostril once or twice daily for allergy 16 g 12  . furosemide (LASIX) 40 MG tablet TAKE 1 TABLET BY MOUTH EVERY DAY MAY TAKE 1 EXTRA TABLET DAILY IF NEEDED FOR SWELLING IN  LEGS 30 tablet 5  . guaiFENesin-dextromethorphan (ROBITUSSIN DM) 100-10 MG/5ML syrup Take 5 mLs by mouth every 4 (four) hours as needed for cough. 118 mL 0  . hydrOXYzine (ATARAX/VISTARIL) 25 MG tablet Take 1-2 tablets by mouth every 4-6 hours as needed for itching    . KLOR-CON M20 20 MEQ tablet TAKE 1 TABLET BY MOUTH EVERY DAY (Patient taking differently: TAKE 1 TABLET BY MOUTH EVERY MORNING) 30 tablet 5  . levothyroxine (SYNTHROID, LEVOTHROID) 25 MCG tablet TAKE 1 TABLET BY MOUTH EVERY MORNING BEFORE BREAKFAST 30 tablet 11  . lidocaine-prilocaine (EMLA) cream Apply 1 application topically as needed. 30 g 2  . metFORMIN (GLUCOPHAGE) 500 MG tablet TAKE 1 TABLET BY MOUTH TWICE A DAY WITH FOOD 60 tablet 5  . mometasone-formoterol (DULERA) 200-5 MCG/ACT AERO Take 2 puffs first thing in am and then another 2 puffs about 12 hours later.    . Multiple Vitamin (MULTIVITAMIN) tablet Take 1 tablet by mouth every morning.     . nitroGLYCERIN (NITROSTAT) 0.4 MG SL tablet Place 1 tablet (0.4 mg total) under the tongue every 5 (five) minutes as needed for chest pain. 90 tablet 3  . omeprazole (PRILOSEC) 20 MG capsule Take 40 mg by mouth daily before breakfast.     . oxybutynin (DITROPAN-XL) 10 MG 24 hr tablet Take 10 mg by mouth every morning. Reported on 05/13/2015    . OXYGEN 2 lpm with sleep    . polyethylene glycol (MIRALAX / GLYCOLAX) packet Take 17 g by mouth daily as needed.    . sulindac (CLINORIL) 200 MG tablet TAKE 1 TABLET BY MOUTH TWICE DAILY AS NEEDED FOR PAIN 60 tablet 5  . traMADol (ULTRAM) 50 MG tablet Take 1/2 to one  tablet by mouth every 4 hours as needed for severe pain or cough 40 tablet 0  . traZODone (DESYREL) 150 MG tablet 2/3 to 1 tablet at bedtime (Patient taking differently: Take 1/3 tablet by mouth at bedtime) 30 tablet 5   No current facility-administered medications for this visit.     Allergies: No Known Allergies  Past Medical History, Surgical history, Social history, and  Family History were reviewed and updated.    Physical Exam: Blood pressure 129/63, pulse 102, temperature 97.6 F (36.4 C), temperature source Oral, resp. rate 18, height 5' 11"  (1.803 m), weight 246 lb 8 oz (111.812 kg), SpO2 98 %. ECOG: 1 General appearance: Chronically ill-appearing gentleman without distress. Head: Normocephalic, without obvious abnormality. No thrush noted. Neck: no adenopathy. No palpable masses. Lymph nodes: Cervical, supraclavicular, and axillary nodes normal. Heart:regular rate and rhythm, S1, S2 normal, no murmur, click, rub or gallop Lung:chest clear, no wheezing, rales, normal symmetric air entry. No dullness to percussion. Abdomen: soft, non-tender, without masses or organomegaly. No rebound or guarding. EXT:no erythema, induration, or nodules. No edema noted. Skin: No rashes or lesions noted.    Lab Results: Lab Results  Component Value Date   WBC 1.0* 05/22/2015   HGB 8.6* 05/22/2015   HCT 25.5* 05/22/2015   MCV 103.6*  05/22/2015   PLT 67* 05/22/2015     Chemistry      Component Value Date/Time   NA 138 05/13/2015 1043   NA 137 01/05/2013 1501   K 4.0 05/13/2015 1043   K 3.8 01/05/2013 1501   CL 99 05/13/2015 1043   CL 99 06/08/2012 1257   CO2 27 05/13/2015 1043   CO2 26 01/05/2013 1501   BUN 14 05/13/2015 1043   BUN 13.9 01/05/2013 1501   CREATININE 0.82 05/13/2015 1043   CREATININE 0.8 01/05/2013 1501      Component Value Date/Time   CALCIUM 9.9 05/13/2015 1043   CALCIUM 10.1 01/05/2013 1501   ALKPHOS 77 01/24/2015 1148   ALKPHOS 48 08/10/2012 1117   AST 31 01/24/2015 1148   AST 17 08/10/2012 1117   ALT 50 01/24/2015 1148   ALT 14 08/10/2012 1117   BILITOT 0.6 01/24/2015 1148   BILITOT 0.82 08/10/2012 1117      Impression and Plan:  This is a 82 year old gentleman with the following issues:  1. Follicular lymphoma diagnosed in 2007. He continues to be in remission at this time. 2. Myelodysplastic syndrome. He is status  post bone marrow biopsy was done on 06/22/2012. He is currently on supportive transfusion only due to the lack of response from Aranesp. I discussed with him the role of 5-azacytidine as a potential therapies to alleviate his transfusion dependence. His transfusion needs are very limited at this time the competition associated with this therapy that includes worsening cytopenia, infection and bleeding. For the time being we'll continue with supportive transfusions only and he'll receive 2 units of packed red cells today. 3. Neutropenia: Neutropenic precautions were given to the patient today. No recent infections noted recently. We will consider adding growth factor support if he has developed recurrent infections. 4. Monoclonal gammopathy.  His M-spike had been less than 1 g/dL, his bone marrow biopsy continued to show a less than 8% plasma cells indicating most likely multiple myeloma.  Last SPEP was done in September 2016 and this will be repeated annually. 5. Thrombocytopenia: Likely related to his myelodysplasia without any bleeding. No pleural transfusion noted. 6. IV access: Port-A-Cath is in place without complications or bleeding. 7. Followup every 2 weeks for an injection and possible transfusion. He will have a clinical visit in 3 months.  [facility], MD 4/26/20179:53 AM

## 2015-05-22 NOTE — Patient Instructions (Signed)
 Blood Transfusion  A blood transfusion is a procedure in which you receive donated blood through an IV tube. You may need a blood transfusion because of illness, surgery, or injury. The blood may come from a donor, or it may be your own blood that you donated previously. The blood given in a transfusion is made up of different types of cells. You may receive:  Red blood cells. These carry oxygen and replace lost blood.  Platelets. These control bleeding.  Plasma. Thishelps blood to clot. If you have hemophilia or another clotting disorder, you may also receive other types of blood products. LET YOUR HEALTH CARE PROVIDER KNOW ABOUT:  Any allergies you have.  All medicines you are taking, including vitamins, herbs, eye drops, creams, and over-the-counter medicines.  Previous problems you or members of your family have had with the use of anesthetics.  Any blood disorders you have.  Previous surgeries you have had.  Any medical conditions you may have.  Any previous reactions you have had during a blood transfusion.  RISKS AND COMPLICATIONS Generally, this is a safe procedure. However, problems may occur, including:  Having an allergic reaction to something in the donated blood.  Fever. This may be a reaction to the white blood cells in the transfused blood.  Iron overload. This can happen from having many transfusions.  Transfusion-related acute lung injury (TRALI). This is a rare reaction that causes lung damage. The cause is not known.TRALI can occur within hours of a transfusion or several days later.  Sudden (acute) or delayed hemolytic reactions. This happens if your blood does not match the cells in your transfusion. Your body's defense system (immune system) may try to attack the new cells. This complication is rare.  Infection. This is rare. BEFORE THE PROCEDURE  You may have a blood test to determine your blood type. This is necessary to know what kind of blood  your body will accept.  If you are going to have a planned surgery, you may donate your own blood. This may be done in case you need to have a transfusion.  If you have had an allergic reaction to a transfusion in the past, you may be given medicine to help prevent a reaction. Take this medicine only as directed by your health care provider.  You will have your temperature, blood pressure, and pulse monitored before the transfusion. PROCEDURE   An IV will be started in your hand or arm.  The bag of donated blood will be attached to your IV tube and given into your vein.  Your temperature, blood pressure, and pulse will be monitored regularly during the transfusion. This monitoring is done to detect early signs of a transfusion reaction.  If you have any signs or symptoms of a reaction, your transfusion will be stopped and you may be given medicine.  When the transfusion is over, your IV will be removed.  Pressure may be applied to the IV site for a few minutes.  A bandage (dressing) will be applied. The procedure may vary among health care providers and hospitals. AFTER THE PROCEDURE  Your blood pressure, temperature, and pulse will be monitored regularly.   This information is not intended to replace advice given to you by your health care provider. Make sure you discuss any questions you have with your health care provider.   Document Released: 01/10/2000 Document Revised: 02/02/2014 Document Reviewed: 11/22/2013 Elsevier Interactive Patient Education 2016 Elsevier Inc.   Anemia, Nonspecific Anemia is a   in which the concentration of red blood cells or hemoglobin in the blood is below normal. Hemoglobin is a substance in red blood cells that carries oxygen to the tissues of the body. Anemia results in not enough oxygen reaching these tissues.  CAUSES  Common causes of anemia include:   Excessive bleeding. Bleeding may be internal or external. This includes excessive  bleeding from periods (in women) or from the intestine.   Poor nutrition.   Chronic kidney, thyroid, and liver disease.  Bone marrow disorders that decrease red blood cell production.  Cancer and treatments for cancer.  HIV, AIDS, and their treatments.  Spleen problems that increase red blood cell destruction.  Blood disorders.  Excess destruction of red blood cells due to infection, medicines, and autoimmune disorders. SIGNS AND SYMPTOMS   Minor weakness.   Dizziness.   Headache.  Palpitations.   Shortness of breath, especially with exercise.   Paleness.  Cold sensitivity.  Indigestion.  Nausea.  Difficulty sleeping.  Difficulty concentrating. Symptoms may occur suddenly or they may develop slowly.  DIAGNOSIS  Additional blood tests are often needed. These help your health care provider determine the best treatment. Your health care provider will check your stool for blood and look for other causes of blood loss.  TREATMENT  Treatment varies depending on the cause of the anemia. Treatment can include:   Supplements of iron, vitamin 123456, or folic acid.   Hormone medicines.   A blood transfusion. This may be needed if blood loss is severe.   Hospitalization. This may be needed if there is significant continual blood loss.   Dietary changes.  Spleen removal. HOME CARE INSTRUCTIONS Keep all follow-up appointments. It often takes many weeks to correct anemia, and having your health care provider check on your condition and your response to treatment is very important. SEEK IMMEDIATE MEDICAL CARE IF:   You develop extreme weakness, shortness of breath, or chest pain.   You become dizzy or have trouble concentrating.  You develop heavy vaginal bleeding.   You develop a rash.   You have bloody or black, tarry stools.   You faint.   You vomit up blood.   You vomit repeatedly.   You have abdominal pain.  You have a fever or  persistent symptoms for more than 2-3 days.   You have a fever and your symptoms suddenly get worse.   You are dehydrated.  MAKE SURE YOU:  Understand these instructions.  Will watch your condition.  Will get help right away if you are not doing well or get worse.   This information is not intended to replace advice given to you by your health care provider. Make sure you discuss any questions you have with your health care provider.   Document Released: 02/20/2004 Document Revised: 09/14/2012 Document Reviewed: 07/08/2012 Elsevier Interactive Patient Education 2016 [employer].   Neutropenia Neutropenia is a condition that occurs when the level of a certain type of white blood cell (neutrophil) in your body becomes lower than normal. Neutrophils are made in the bone marrow and fight infections. These cells protect against bacteria and viruses. The fewer neutrophils you have, and the longer your body remains without them, the greater your risk of getting a severe infection becomes. CAUSES  The cause of neutropenia may be hard to determine. However, it is usually due to 3 main problems:   Decreased production of neutrophils. This may be due to:  Certain medicines such as chemotherapy.  Genetic problems.  Cancer.  Radiation treatments.  Vitamin deficiency.  Some pesticides.  Increased destruction of neutrophils. This may be due to:  Overwhelming infections.  Hemolytic anemia. This is when the body destroys its own blood cells.  Chemotherapy.  Neutrophils moving to areas of the body where they cannot fight infections. This may be due to:  Dialysis procedures.  Conditions where the spleen becomes enlarged. Neutrophils are held in the spleen and are not available to the rest of the body.  Overwhelming infections. The neutrophils are held in the area of the infection and are not available to the rest of the body. SYMPTOMS  There are no specific symptoms of  neutropenia. The lack of neutrophils can result in an infection, and an infection can cause various problems. DIAGNOSIS  Diagnosis is made by a blood test. A complete blood count is performed. The normal level of neutrophils in human blood differs with age and race. Infants have lower counts than older children and adults. African Americans have lower counts than Caucasians or Asians. The average adult level is 1500 cells/mm3 of blood. Neutrophil counts are interpreted as follows:  Greater than 1000 cells/mm3 gives normal protection against infection.  500 to 1000 cells/mm3 gives an increased risk for infection.  200 to 500 cells/mm3 is a greater risk for severe infection.  Lower than 200 cells/mm3 is a marked risk of infection. This may require hospitalization and treatment with antibiotic medicines. TREATMENT  Treatment depends on the underlying cause, severity, and presence of infections or symptoms. It also depends on your health. Your caregiver will discuss the treatment plan with you. Mild cases are often easily treated and have a good outcome. Preventative measures may also be started to limit your risk of infections. Treatment can include:  Taking antibiotics.  Stopping medicines that are known to cause neutropenia.  Correcting nutritional deficiencies by eating green vegetables to supply folic acid and taking vitamin B supplements.  Stopping exposure to pesticides if your neutropenia is related to pesticide exposure.  Taking a blood growth factor called sargramostim, pegfilgrastim, or filgrastim if you are undergoing chemotherapy for cancer. This stimulates white blood cell production.  Removal of the spleen if you have Felty's syndrome and have repeated infections. HOME CARE INSTRUCTIONS   Follow your caregiver's instructions about when you need to have blood work done.  Wash your hands often. Make sure others who come in contact with you also wash their hands.  Wash raw  fruits and vegetables before eating them. They can carry bacteria and fungi.  Avoid people with colds or spreadable (contagious) diseases (chickenpox, herpes zoster, influenza).  Avoid large crowds.  Avoid construction areas. The dust can release fungus into the air.  Be cautious around children in daycare or school environments.  Take care of your respiratory system by coughing and deep breathing.  Bathe daily.  Protect your skin from cuts and burns.  Do not work in the garden or with flowers and plants.  Care for the mouth before and after meals by brushing with a soft toothbrush. If you have mucositis, do not use mouthwash. Mouthwash contains alcohol and can dry out the mouth even more.  Clean the area between the genitals and the anus (perineal area) after urination and bowel movements. Women need to wipe from front to back.  Use a water soluble lubricant during sexual intercourse and practice good hygiene after. Do not have intercourse if you are severely neutropenic. Check with your caregiver for guidelines.  Exercise daily as tolerated.  Avoid people who were vaccinated with a live vaccine in the past 30 days. You should not receive live vaccines (polio, typhoid).  Do not provide direct care for pets. Avoid animal droppings. Do not clean litter boxes and bird cages.  Do not share food utensils.  Do not use tampons, enemas, or rectal suppositories unless directed by your caregiver.  Use an electric razor to remove hair.  Wash your hands after handling magazines, letters, and newspapers. SEEK IMMEDIATE MEDICAL CARE IF:   You have a fever.  You have chills or start to shake.  You feel nauseous or vomit.  You develop mouth sores.  You develop aches and pains.  You have redness and swelling around open wounds.  Your skin is warm to the touch.  You have pus coming from your wounds.  You develop swollen lymph nodes.  You feel weak or fatigued.  You develop  red streaks on the skin. MAKE SURE YOU:  Understand these instructions.  Will watch your condition.  Will get help right away if you are not doing well or get worse.   This information is not intended to replace advice given to you by your health care provider. Make sure you discuss any questions you have with your health care provider.   Document Released: 07/04/2001 Document Revised: 04/06/2011 Document Reviewed: 07/25/2014 Elsevier Interactive Patient Education [employer].

## 2015-05-23 ENCOUNTER — Telehealth: Payer: Self-pay | Admitting: Internal Medicine

## 2015-05-23 LAB — TYPE AND SCREEN
ABO/RH(D): A POS
Antibody Screen: NEGATIVE
UNIT DIVISION: 0
UNIT DIVISION: 0

## 2015-05-23 NOTE — Telephone Encounter (Signed)
Spoke with Doris @ [facility] and gave verbal to add date to script for walker. Gave OV date of 05/13/15. Nothing further needed.

## 2015-05-27 ENCOUNTER — Ambulatory Visit (HOSPITAL_COMMUNITY)
Admission: RE | Admit: 2015-05-27 | Discharge: 2015-05-27 | Disposition: A | Discharge status: Home / Self Care | Payer: Medicare Other | Source: Ambulatory Visit | Attending: Oncology | Admitting: Oncology

## 2015-05-27 DIAGNOSIS — D469 Myelodysplastic syndrome, unspecified: Secondary | ICD-10-CM | POA: Insufficient documentation

## 2015-06-05 ENCOUNTER — Ambulatory Visit: Payer: Medicare Other

## 2015-06-05 ENCOUNTER — Other Ambulatory Visit (HOSPITAL_BASED_OUTPATIENT_CLINIC_OR_DEPARTMENT_OTHER): Payer: Medicare Other

## 2015-06-05 ENCOUNTER — Other Ambulatory Visit: Payer: Self-pay | Admitting: Adult Health

## 2015-06-05 ENCOUNTER — Ambulatory Visit (HOSPITAL_BASED_OUTPATIENT_CLINIC_OR_DEPARTMENT_OTHER): Payer: Medicare Other

## 2015-06-05 VITALS — BP 122/69 | HR 89 | Temp 98.2°F | Resp 18

## 2015-06-05 DIAGNOSIS — D61818 Other pancytopenia: Secondary | ICD-10-CM

## 2015-06-05 DIAGNOSIS — D469 Myelodysplastic syndrome, unspecified: Secondary | ICD-10-CM

## 2015-06-05 DIAGNOSIS — E538 Deficiency of other specified B group vitamins: Secondary | ICD-10-CM

## 2015-06-05 DIAGNOSIS — Z452 Encounter for adjustment and management of vascular access device: Secondary | ICD-10-CM

## 2015-06-05 DIAGNOSIS — Z95828 Presence of other vascular implants and grafts: Secondary | ICD-10-CM

## 2015-06-05 LAB — CBC WITH DIFFERENTIAL/PLATELET
BASO%: 0 % (ref 0.0–2.0)
Basophils Absolute: 0 10*3/uL (ref 0.0–0.1)
EOS ABS: 0 10*3/uL (ref 0.0–0.5)
EOS%: 0 % (ref 0.0–7.0)
HCT: 28 % — ABNORMAL LOW (ref 38.4–49.9)
HEMOGLOBIN: 9.7 g/dL — AB (ref 13.0–17.1)
LYMPH%: 70.9 % — ABNORMAL HIGH (ref 14.0–49.0)
MCH: 34.4 pg — ABNORMAL HIGH (ref 27.2–33.4)
MCHC: 34.6 g/dL (ref 32.0–36.0)
MCV: 99.3 fL — ABNORMAL HIGH (ref 79.3–98.0)
MONO#: 0 10*3/uL — ABNORMAL LOW (ref 0.1–0.9)
MONO%: 0.9 % (ref 0.0–14.0)
NEUT%: 28.2 % — ABNORMAL LOW (ref 39.0–75.0)
NEUTROS ABS: 0.3 10*3/uL — AB (ref 1.5–6.5)
Platelets: 62 10*3/uL — ABNORMAL LOW (ref 140–400)
RBC: 2.82 10*6/uL — AB (ref 4.20–5.82)
RDW: 22.9 % — AB (ref 11.0–14.6)
WBC: 1.1 10*3/uL — AB (ref 4.0–10.3)
lymph#: 0.8 10*3/uL — ABNORMAL LOW (ref 0.9–3.3)

## 2015-06-05 MED ORDER — SODIUM CHLORIDE 0.9% FLUSH
10.0000 mL | INTRAVENOUS | Status: AC | PRN
  Administered 2015-06-05: 10 mL
  Filled 2015-06-05: qty 10

## 2015-06-05 MED ORDER — SODIUM CHLORIDE 0.9 % IJ SOLN
10.0000 mL | INTRAMUSCULAR | Status: DC | PRN
  Administered 2015-06-05: 10 mL via INTRAVENOUS
  Filled 2015-06-05: qty 10

## 2015-06-05 MED ORDER — HEPARIN SOD (PORK) LOCK FLUSH 100 UNIT/ML IV SOLN
500.0000 [IU] | Freq: Every day | INTRAVENOUS | Status: AC | PRN
  Administered 2015-06-05: 500 [IU]
  Filled 2015-06-05: qty 5

## 2015-06-05 NOTE — Progress Notes (Signed)
Patient does not need blood transfusion today per Cherre Huger, RN. Patient brought back to infusion to d/c PAC.

## 2015-06-05 NOTE — Patient Instructions (Signed)

## 2015-06-06 LAB — VITAMIN B12: VITAMIN B 12: 378 pg/mL (ref 211–946)

## 2015-06-06 LAB — FOLATE

## 2015-06-07 ENCOUNTER — Ambulatory Visit (HOSPITAL_COMMUNITY)
Admission: RE | Admit: 2015-06-07 | Discharge: 2015-06-07 | Disposition: A | Discharge status: Home / Self Care | Payer: Medicare Other | Source: Ambulatory Visit | Attending: Internal Medicine | Admitting: Internal Medicine

## 2015-06-07 DIAGNOSIS — R0600 Dyspnea, unspecified: Secondary | ICD-10-CM | POA: Diagnosis not present

## 2015-06-07 LAB — PULMONARY FUNCTION TEST
DL/VA % pred: 79 %
DL/VA: 3.7 ml/min/mmHg/L
DLCO unc % pred: 43 %
DLCO unc: 14.64 ml/min/mmHg
FEF 25-75 Post: 2.18 L/sec
FEF 25-75 Pre: 2 L/sec
FEF2575-%CHANGE-POST: 8 %
FEF2575-%PRED-PRE: 98 %
FEF2575-%Pred-Post: 107 %
FEV1-%CHANGE-POST: 0 %
FEV1-%PRED-POST: 74 %
FEV1-%PRED-PRE: 74 %
FEV1-PRE: 2.21 L
FEV1-Post: 2.22 L
FEV1FVC-%CHANGE-POST: 1 %
FEV1FVC-%Pred-Pre: 110 %
FEV6-%Change-Post: -1 %
FEV6-%PRED-PRE: 72 %
FEV6-%Pred-Post: 71 %
FEV6-POST: 2.78 L
FEV6-PRE: 2.81 L
FEV6FVC-%Change-Post: 0 %
FEV6FVC-%PRED-POST: 107 %
FEV6FVC-%PRED-PRE: 107 %
FVC-%CHANGE-POST: -1 %
FVC-%PRED-PRE: 67 %
FVC-%Pred-Post: 66 %
FVC-POST: 2.78 L
FVC-PRE: 2.82 L
POST FEV6/FVC RATIO: 100 %
PRE FEV1/FVC RATIO: 78 %
PRE FEV6/FVC RATIO: 100 %
Post FEV1/FVC ratio: 80 %
RV % pred: 73 %
RV: 2.01 L
TLC % pred: 66 %
TLC: 4.79 L

## 2015-06-07 MED ORDER — ALBUTEROL SULFATE (2.5 MG/3ML) 0.083% IN NEBU
2.5000 mg | INHALATION_SOLUTION | Freq: Once | RESPIRATORY_TRACT | Status: AC
  Administered 2015-06-07: 2.5 mg via RESPIRATORY_TRACT

## 2015-06-10 ENCOUNTER — Ambulatory Visit (INDEPENDENT_AMBULATORY_CARE_PROVIDER_SITE_OTHER): Payer: Medicare Other | Admitting: Internal Medicine

## 2015-06-10 ENCOUNTER — Encounter: Payer: Self-pay | Admitting: Internal Medicine

## 2015-06-10 VITALS — BP 114/70 | HR 101 | Ht 71.0 in | Wt 248.0 lb

## 2015-06-10 DIAGNOSIS — Z23 Encounter for immunization: Secondary | ICD-10-CM

## 2015-06-10 DIAGNOSIS — R0600 Dyspnea, unspecified: Secondary | ICD-10-CM

## 2015-06-10 DIAGNOSIS — J45991 Cough variant asthma: Secondary | ICD-10-CM | POA: Diagnosis not present

## 2015-06-10 MED ORDER — METHYLPREDNISOLONE ACETATE 80 MG/ML IJ SUSP
120.0000 mg | Freq: Once | INTRAMUSCULAR | Status: AC
  Administered 2015-06-10: 120 mg via INTRAMUSCULAR

## 2015-06-10 NOTE — Patient Instructions (Addendum)
Tdap today   Depomedrol 120 mg IM today to see if breathing   Work on inhaler technique:  relax and gently blow all the way out then take a nice smooth deep breath back in, triggering the inhaler at same time you start breathing in.  Hold for up to 5 seconds if you can. Blow out thru nose. Rinse and gargle with water when done     Please schedule a follow up office visit in 4 weeks, sooner if needed to see Tammy NP with all medications

## 2015-06-10 NOTE — Progress Notes (Signed)
Subjective:    Patient ID: [name], male    DOB: [birth year]  MRN: [id number]  History of Present Illness  82 year old white male, former smoker( Quit date 32), with mild obesity complicated by aodm, Lymphoma s/p chemo ([facility]) and tendency to peripheral edema that is felt to be dependent and related to obesity/ venous insuff.Here for Follow up hospital admission visit.   Admit date: 01/18/2015 Discharge date: 01/22/2015  Discharge Diagnoses:  Principal Problem:  Sepsis due to pneumonia [facility]) Active Problems:  Lobar pneumonia, unspecified organism ([place])  Lactic acidosis  Pancytopenia ([facility])  MDS (myelodysplastic syndrome) ([facility])  Neutropenia ([facility])  Thrombocytopenia ([facility])  Anemia of chronic disease  Pacemaker  Chronic diastolic CHF (congestive heart failure) ([place])     03/05/2015 Follow up : DCHF , Cough variant asthma , DM  Pt returns for a 3 week follow up .  Says overall he is doing better. Recovering from PNA.  Clinically is improving with improved energy.  Working with PT at home.  Now able to walk with cane.  Minimal cough and congestion . No fever or discolored mucus.  No hemoptysis .  No increased leg swelling or orthopnea  Needs updated med calendar list, reviewed with pt and daughter.  Would like a refill of xanax, uses it at night to help with sleep. On low dose  rec No changes  04/12/15 Dr Donalee Citrin Script sent for doxycycline antibiotic Ok to take a probiotic with the antibiotic Script sent changing Dulera to Symbicort maintenance inhaler Script sent changing nasonex to flonase nasal spray     05/13/2015 extended  f/u ov/Wert re: sob /cough ? Asthma related  Chief Complaint  Patient presents with  . Follow-up    Breathing is progressively worse. He states he gets winded with very minimal exertion. Cough has not improved since the last visit. He still has occ wheeze.   cough is sporadic more often sitting in chair / and w/in 10 min  at hs x months with minimal mucoid sputum produced and no am excess  Breathing worse p change to symbicort from dulera but turns out at baseline using the neb bid, not prn as per med calendar  Doe = MMRC3 = can't walk 100 yards even at a slow pace at a flat grade s stopping due to sob  (not reproduced on walk test this ov) Much better after tx of prbc's vs prior. Using saba multiple forms hfa and neb rec Walk slower if getting short of breath with activity  Change trazadone 150 one whole at bedtime Restart dulera 200 2 every 12 hours  For drainage / throat tickle try take CHLORPHENIRAMINE  4 mg - take one every 4 hours as needed   Work on inhaler technique:   Plan A = Automatic = dulera 200 Take 2 puffs first thing in am and then another 2 puffs about 12 hours later  Plan B = Backup Only use your albuterol (proair) as a rescue medication. - Ok to use the inhaler up to 2 puffs  every 4 hours if you must but call for appointment if use goes up over your usual need - Don't leave home without it !!  (think of it like the spare tire for your car)  Plan C = Crisis/ Contringency - only use your albuterol nebulizer if you first try Plan B  Depomedrol 120 today     06/10/2015  Extended  f/u ov/Wert re: doe/ restriction on pfts (p  am dulera 200) Chief Complaint  Patient presents with  . Follow-up    Breathing is unchanged. No new co's today.   thinks maybe a little better sob p depomedrol 120 last ov but biggest change was with tx prbcs - using saba in hfa form only (never neb) around noon and 6 pm though supposed to be using dulera 200 2 when wakes and 12 h later per med calendar, very easily confused with details of care / can't seem to remember whether he's bothered by cough or not - could not find chlorpheniramine    No obvious patterns in  day to day or daytime variability or assoc excess/ purulent sputum or mucus plugs  or cp or chest tightness, subjective wheeze or overt sinus or hb  symptoms. No unusual exp hx or h/o childhood pna/ asthma or knowledge of premature birth.   Also denies any obvious fluctuation of symptoms with weather or environmental changes or other aggravating or alleviating factors except as outlined above   Current Medications, Allergies, Complete Past Medical History, Past Surgical History, Family History, and Social History were reviewed in [employer] record.  ROS  The following are not active complaints unless bolded sore throat, dysphagia, dental problems, itching, sneezing,  nasal congestion or excess/ purulent secretions, ear ache,   fever, chills, sweats, unintended wt loss, classically pleuritic or exertional cp, hemoptysis,  orthopnea pnd or leg swelling, presyncope, palpitations, abdominal pain, anorexia, nausea, vomiting, diarrhea  or change in bowel or bladder habits, change in stools or urine, dysuria,hematuria,  rash, arthralgias, visual complaints, headache, numbness, weakness or ataxia or problems with walking or coordination,  change in mood/affect or memory.                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 [facility]  - MRI 11/23/2007 c/w L1-2 bulging disc indents the thecal sac with foraminal stenosis  HEALTH  MAINTENANCE..........................................................................................Marland KitchenWert  - Td 10/2005 and 06/10/2015  - Pneumovax 10/07 and 10/2012 and Prevnar 06/14/13   -colon 02/2005 -int/extern. hems (repeat q7y)  Complex med regimen  --Meds reviewed with pt education and computerized med calendar completed/adjusted. November 08, 2008 , August 21, 2009 updated 02/19/2011 , 10/23/2011 , 09/19/2013  Syncope...........................................................................................................[facility] KitchenHochrein  - Mobitz II AV block s/p Medtronic pacemaker placement 12/11/2008  [facility].....................................................................................................[facility]  - Pruritic rash 04/2009 > tried lotrisone, referred to [facility] August 06, 2009  Memory impairment , MMSE 27/30 8/25>refer to neuro   Family History:  mother had diabetes   Social History:  Patient states former smoker.  quit smoking in 1970  No ETOH  Retired       Objective:   Physical Exam   amb obese wm nad/ walking with rolling walker with hand brakes   06/10/2015        248   05/13/15 245 lb (111.131 kg)  04/12/15 242 lb 12.8 oz (110.133 kg)  03/27/15 239 lb 6.4 oz (108.591 kg)    Vital signs reviewed   Physical Exam:  General- Elderly gentleman,  No distress,  A&Ox3, pleasant ENT: No sinus tenderness, TM clear, pale nasal mucosa, no oral exudate,no post nasal drip, no LAN Cardiac: S1, S2, regular rate and rhythm, no murmur Chest: No wheeze/   no accessory muscle use, diminished bs in bases - Abd.: Soft Non-tender Ext: trace sym pitting edema bilateral lower extremities Neuro:  Alert/ nl sensorium, no motor def Skin: No rashes, warm and dry Psych: normal mood and behavior      Labs  reviewed:      Chemistry      Component Value Date/Time   NA 138 05/13/2015 1043   NA 137 01/05/2013 1501   K 4.0 05/13/2015 1043   K 3.8  01/05/2013 1501   CL 99 05/13/2015 1043   CL 99 06/08/2012 1257   CO2 27 05/13/2015 1043   CO2 26 01/05/2013 1501   BUN 14 05/13/2015 1043   BUN 13.9 01/05/2013 1501   CREATININE 0.82 05/13/2015 1043   CREATININE 0.8 01/05/2013 1501      Component Value Date/Time   CALCIUM 9.9 05/13/2015 1043   CALCIUM 10.1 01/05/2013 1501   ALKPHOS 77 01/24/2015 1148   ALKPHOS 48 08/10/2012 1117   AST 31 01/24/2015 1148   AST 17 08/10/2012 1117   ALT 50 01/24/2015 1148   ALT 14 08/10/2012 1117   BILITOT 0.6 01/24/2015 1148   BILITOT 0.82 08/10/2012 1117        Lab Results  Component Value Date   WBC 1.5 Repeated and verified X2.* 05/13/2015   HGB 9.6* 05/13/2015   HCT 27.8* 05/13/2015   MCV 100.7* 05/13/2015   PLT 74.0* 05/13/2015        Lab Results  Component Value Date   TSH 2.70 05/13/2015     Lab Results  Component Value Date   PROBNP 271.0* 05/13/2015                     Assessment & Plan:

## 2015-06-10 NOTE — Assessment & Plan Note (Signed)
05/13/2015  After extensive coaching HFA effectiveness =    75% > rechallenge with dulera 200 2bid s spacer - PFT's  06/07/15 >   Restrictive with ERV 31% p dulera w/in 4 h and no obstruction - 06/10/2015  After extensive coaching HFA effectiveness =    75%   Really not clear at this point how much if any of his symptoms really represent asthma but reasonable to rechallenge with dulera 200 (used properly) and one Depomedrol 120 mg IM and see if symptoms / need for saba drop or not and if this answer is no then d/c dulera altogether (note previously said symbicort didn't work and now in retrospect may be due to fact this isn't asthma in the first place.

## 2015-06-10 NOTE — Assessment & Plan Note (Signed)
05/13/2015   Walked RA  2 laps @ 185 ft each stopped due to  Sob/ sore legs relatively fast pace, sats 92% at lowest  - PFT's 06/07/15 c/w low ERV only   Reviewed details of pft's, need to pace and keep wt down.   I had an extended discussion with the patient and daughter  reviewing all relevant studies completed to date and  lasting 25  minutes of a 40  minute visit    Each maintenance medication was reviewed in detail including most importantly the difference between maintenance and prns and under what circumstances the prns are to be triggered using an action plan format that is not reflected in the computer generated alphabetically organized AVS but trather by a customized med calendar that reflects the AVS meds with confirmed 100% correlation.   Please see instructions for details which were reviewed in writing and the patient given a copy highlighting the part that I personally wrote and discussed at today's ov.

## 2015-06-19 ENCOUNTER — Other Ambulatory Visit: Payer: Self-pay | Admitting: *Deleted

## 2015-06-19 ENCOUNTER — Ambulatory Visit (HOSPITAL_BASED_OUTPATIENT_CLINIC_OR_DEPARTMENT_OTHER): Payer: Medicare Other

## 2015-06-19 ENCOUNTER — Ambulatory Visit: Payer: Medicare Other

## 2015-06-19 ENCOUNTER — Other Ambulatory Visit (HOSPITAL_BASED_OUTPATIENT_CLINIC_OR_DEPARTMENT_OTHER): Payer: Medicare Other

## 2015-06-19 ENCOUNTER — Ambulatory Visit (HOSPITAL_COMMUNITY)
Admission: RE | Admit: 2015-06-19 | Discharge: 2015-06-19 | Disposition: A | Discharge status: Home / Self Care | Payer: Medicare Other | Source: Ambulatory Visit | Attending: Oncology | Admitting: Oncology

## 2015-06-19 VITALS — BP 118/50 | HR 71 | Temp 98.2°F | Resp 18

## 2015-06-19 DIAGNOSIS — D61818 Other pancytopenia: Secondary | ICD-10-CM

## 2015-06-19 DIAGNOSIS — D469 Myelodysplastic syndrome, unspecified: Secondary | ICD-10-CM

## 2015-06-19 DIAGNOSIS — E538 Deficiency of other specified B group vitamins: Secondary | ICD-10-CM

## 2015-06-19 DIAGNOSIS — D649 Anemia, unspecified: Secondary | ICD-10-CM | POA: Diagnosis present

## 2015-06-19 DIAGNOSIS — Z95828 Presence of other vascular implants and grafts: Secondary | ICD-10-CM

## 2015-06-19 LAB — CBC WITH DIFFERENTIAL/PLATELET
BASO%: 0 % (ref 0.0–2.0)
BASOS ABS: 0 10*3/uL (ref 0.0–0.1)
EOS%: 0.8 % (ref 0.0–7.0)
Eosinophils Absolute: 0 10*3/uL (ref 0.0–0.5)
HEMATOCRIT: 24.8 % — AB (ref 38.4–49.9)
HGB: 8.6 g/dL — ABNORMAL LOW (ref 13.0–17.1)
LYMPH#: 0.8 10*3/uL — AB (ref 0.9–3.3)
LYMPH%: 64.3 % — ABNORMAL HIGH (ref 14.0–49.0)
MCH: 34.8 pg — AB (ref 27.2–33.4)
MCHC: 34.7 g/dL (ref 32.0–36.0)
MCV: 100.4 fL — AB (ref 79.3–98.0)
MONO#: 0 10*3/uL — AB (ref 0.1–0.9)
MONO%: 0.8 % (ref 0.0–14.0)
NEUT#: 0.4 10*3/uL — CL (ref 1.5–6.5)
NEUT%: 34.1 % — ABNORMAL LOW (ref 39.0–75.0)
Platelets: 57 10*3/uL — ABNORMAL LOW (ref 140–400)
RBC: 2.47 10*6/uL — ABNORMAL LOW (ref 4.20–5.82)
RDW: 23.1 % — AB (ref 11.0–14.6)
WBC: 1.3 10*3/uL — ABNORMAL LOW (ref 4.0–10.3)

## 2015-06-19 LAB — PREPARE RBC (CROSSMATCH)

## 2015-06-19 MED ORDER — DIPHENHYDRAMINE HCL 25 MG PO CAPS: 25.0000 mg | ORAL_CAPSULE | Freq: Once | ORAL | Status: DC

## 2015-06-19 MED ORDER — SODIUM CHLORIDE 0.9 % IJ SOLN
10.0000 mL | INTRAMUSCULAR | Status: DC | PRN
  Administered 2015-06-19: 10 mL via INTRAVENOUS
  Filled 2015-06-19: qty 10

## 2015-06-19 MED ORDER — HEPARIN SOD (PORK) LOCK FLUSH 100 UNIT/ML IV SOLN
500.0000 [IU] | Freq: Once | INTRAVENOUS | Status: AC | PRN
  Administered 2015-06-19: 500 [IU] via INTRAVENOUS
  Filled 2015-06-19: qty 5

## 2015-06-19 MED ORDER — SODIUM CHLORIDE 0.9 % IV SOLN
250.0000 mL | Freq: Once | INTRAVENOUS | Status: AC
  Administered 2015-06-19: 250 mL via INTRAVENOUS

## 2015-06-19 MED ORDER — FUROSEMIDE 10 MG/ML IJ SOLN
INTRAMUSCULAR | Status: AC
  Filled 2015-06-19: qty 2

## 2015-06-19 MED ORDER — FUROSEMIDE 10 MG/ML IJ SOLN
20.0000 mg | Freq: Once | INTRAMUSCULAR | Status: AC
  Administered 2015-06-19: 20 mg via INTRAVENOUS

## 2015-06-19 MED ORDER — ACETAMINOPHEN 325 MG PO TABS
ORAL_TABLET | ORAL | Status: AC
  Filled 2015-06-19: qty 2

## 2015-06-19 MED ORDER — SODIUM CHLORIDE 0.9% FLUSH
3.0000 mL | INTRAVENOUS | Status: DC | PRN
  Filled 2015-06-19: qty 10

## 2015-06-19 MED ORDER — DIPHENHYDRAMINE HCL 25 MG PO CAPS
ORAL_CAPSULE | ORAL | Status: AC
  Filled 2015-06-19: qty 1

## 2015-06-19 MED ORDER — HEPARIN SOD (PORK) LOCK FLUSH 100 UNIT/ML IV SOLN
250.0000 [IU] | INTRAVENOUS | Status: DC | PRN
  Filled 2015-06-19: qty 5

## 2015-06-19 MED ORDER — ACETAMINOPHEN 325 MG PO TABS: 650.0000 mg | ORAL_TABLET | Freq: Once | ORAL | Status: DC

## 2015-06-19 NOTE — Patient Instructions (Signed)

## 2015-06-19 NOTE — Patient Instructions (Signed)
 Blood Transfusion  A blood transfusion is a procedure in which you receive donated blood through an IV tube. You may need a blood transfusion because of illness, surgery, or injury. The blood may come from a donor, or it may be your own blood that you donated previously. The blood given in a transfusion is made up of different types of cells. You may receive:  Red blood cells. These carry oxygen and replace lost blood.  Platelets. These control bleeding.  Plasma. Thishelps blood to clot. If you have hemophilia or another clotting disorder, you may also receive other types of blood products. LET YOUR HEALTH CARE PROVIDER KNOW ABOUT:  Any allergies you have.  All medicines you are taking, including vitamins, herbs, eye drops, creams, and over-the-counter medicines.  Previous problems you or members of your family have had with the use of anesthetics.  Any blood disorders you have.  Previous surgeries you have had.  Any medical conditions you may have.  Any previous reactions you have had during a blood transfusion.  RISKS AND COMPLICATIONS Generally, this is a safe procedure. However, problems may occur, including:  Having an allergic reaction to something in the donated blood.  Fever. This may be a reaction to the white blood cells in the transfused blood.  Iron overload. This can happen from having many transfusions.  Transfusion-related acute lung injury (TRALI). This is a rare reaction that causes lung damage. The cause is not known.TRALI can occur within hours of a transfusion or several days later.  Sudden (acute) or delayed hemolytic reactions. This happens if your blood does not match the cells in your transfusion. Your body's defense system (immune system) may try to attack the new cells. This complication is rare.  Infection. This is rare. BEFORE THE PROCEDURE  You may have a blood test to determine your blood type. This is necessary to know what kind of blood  your body will accept.  If you are going to have a planned surgery, you may donate your own blood. This may be done in case you need to have a transfusion.  If you have had an allergic reaction to a transfusion in the past, you may be given medicine to help prevent a reaction. Take this medicine only as directed by your health care provider.  You will have your temperature, blood pressure, and pulse monitored before the transfusion. PROCEDURE   An IV will be started in your hand or arm.  The bag of donated blood will be attached to your IV tube and given into your vein.  Your temperature, blood pressure, and pulse will be monitored regularly during the transfusion. This monitoring is done to detect early signs of a transfusion reaction.  If you have any signs or symptoms of a reaction, your transfusion will be stopped and you may be given medicine.  When the transfusion is over, your IV will be removed.  Pressure may be applied to the IV site for a few minutes.  A bandage (dressing) will be applied. The procedure may vary among health care providers and hospitals. AFTER THE PROCEDURE  Your blood pressure, temperature, and pulse will be monitored regularly.   This information is not intended to replace advice given to you by your health care provider. Make sure you discuss any questions you have with your health care provider.   Document Released: 01/10/2000 Document Revised: 02/02/2014 Document Reviewed: 11/22/2013 Elsevier Interactive Patient Education 2016 Elsevier Inc.   Anemia, Nonspecific Anemia is a   in which the concentration of red blood cells or hemoglobin in the blood is below normal. Hemoglobin is a substance in red blood cells that carries oxygen to the tissues of the body. Anemia results in not enough oxygen reaching these tissues.  CAUSES  Common causes of anemia include:   Excessive bleeding. Bleeding may be internal or external. This includes excessive  bleeding from periods (in women) or from the intestine.   Poor nutrition.   Chronic kidney, thyroid, and liver disease.  Bone marrow disorders that decrease red blood cell production.  Cancer and treatments for cancer.  HIV, AIDS, and their treatments.  Spleen problems that increase red blood cell destruction.  Blood disorders.  Excess destruction of red blood cells due to infection, medicines, and autoimmune disorders. SIGNS AND SYMPTOMS   Minor weakness.   Dizziness.   Headache.  Palpitations.   Shortness of breath, especially with exercise.   Paleness.  Cold sensitivity.  Indigestion.  Nausea.  Difficulty sleeping.  Difficulty concentrating. Symptoms may occur suddenly or they may develop slowly.  DIAGNOSIS  Additional blood tests are often needed. These help your health care provider determine the best treatment. Your health care provider will check your stool for blood and look for other causes of blood loss.  TREATMENT  Treatment varies depending on the cause of the anemia. Treatment can include:   Supplements of iron, vitamin 123456, or folic acid.   Hormone medicines.   A blood transfusion. This may be needed if blood loss is severe.   Hospitalization. This may be needed if there is significant continual blood loss.   Dietary changes.  Spleen removal. HOME CARE INSTRUCTIONS Keep all follow-up appointments. It often takes many weeks to correct anemia, and having your health care provider check on your condition and your response to treatment is very important. SEEK IMMEDIATE MEDICAL CARE IF:   You develop extreme weakness, shortness of breath, or chest pain.   You become dizzy or have trouble concentrating.  You develop heavy vaginal bleeding.   You develop a rash.   You have bloody or black, tarry stools.   You faint.   You vomit up blood.   You vomit repeatedly.   You have abdominal pain.  You have a fever or  persistent symptoms for more than 2-3 days.   You have a fever and your symptoms suddenly get worse.   You are dehydrated.  MAKE SURE YOU:  Understand these instructions.  Will watch your condition.  Will get help right away if you are not doing well or get worse.   This information is not intended to replace advice given to you by your health care provider. Make sure you discuss any questions you have with your health care provider.   Document Released: 02/20/2004 Document Revised: 09/14/2012 Document Reviewed: 07/08/2012 Elsevier Interactive Patient Education 2016 [employer].   Neutropenia Neutropenia is a condition that occurs when the level of a certain type of white blood cell (neutrophil) in your body becomes lower than normal. Neutrophils are made in the bone marrow and fight infections. These cells protect against bacteria and viruses. The fewer neutrophils you have, and the longer your body remains without them, the greater your risk of getting a severe infection becomes. CAUSES  The cause of neutropenia may be hard to determine. However, it is usually due to 3 main problems:   Decreased production of neutrophils. This may be due to:  Certain medicines such as chemotherapy.  Genetic problems.  Cancer.  Radiation treatments.  Vitamin deficiency.  Some pesticides.  Increased destruction of neutrophils. This may be due to:  Overwhelming infections.  Hemolytic anemia. This is when the body destroys its own blood cells.  Chemotherapy.  Neutrophils moving to areas of the body where they cannot fight infections. This may be due to:  Dialysis procedures.  Conditions where the spleen becomes enlarged. Neutrophils are held in the spleen and are not available to the rest of the body.  Overwhelming infections. The neutrophils are held in the area of the infection and are not available to the rest of the body. SYMPTOMS  There are no specific symptoms of  neutropenia. The lack of neutrophils can result in an infection, and an infection can cause various problems. DIAGNOSIS  Diagnosis is made by a blood test. A complete blood count is performed. The normal level of neutrophils in human blood differs with age and race. Infants have lower counts than older children and adults. African Americans have lower counts than Caucasians or Asians. The average adult level is 1500 cells/mm3 of blood. Neutrophil counts are interpreted as follows:  Greater than 1000 cells/mm3 gives normal protection against infection.  500 to 1000 cells/mm3 gives an increased risk for infection.  200 to 500 cells/mm3 is a greater risk for severe infection.  Lower than 200 cells/mm3 is a marked risk of infection. This may require hospitalization and treatment with antibiotic medicines. TREATMENT  Treatment depends on the underlying cause, severity, and presence of infections or symptoms. It also depends on your health. Your caregiver will discuss the treatment plan with you. Mild cases are often easily treated and have a good outcome. Preventative measures may also be started to limit your risk of infections. Treatment can include:  Taking antibiotics.  Stopping medicines that are known to cause neutropenia.  Correcting nutritional deficiencies by eating green vegetables to supply folic acid and taking vitamin B supplements.  Stopping exposure to pesticides if your neutropenia is related to pesticide exposure.  Taking a blood growth factor called sargramostim, pegfilgrastim, or filgrastim if you are undergoing chemotherapy for cancer. This stimulates white blood cell production.  Removal of the spleen if you have Felty's syndrome and have repeated infections. HOME CARE INSTRUCTIONS   Follow your caregiver's instructions about when you need to have blood work done.  Wash your hands often. Make sure others who come in contact with you also wash their hands.  Wash raw  fruits and vegetables before eating them. They can carry bacteria and fungi.  Avoid people with colds or spreadable (contagious) diseases (chickenpox, herpes zoster, influenza).  Avoid large crowds.  Avoid construction areas. The dust can release fungus into the air.  Be cautious around children in daycare or school environments.  Take care of your respiratory system by coughing and deep breathing.  Bathe daily.  Protect your skin from cuts and burns.  Do not work in the garden or with flowers and plants.  Care for the mouth before and after meals by brushing with a soft toothbrush. If you have mucositis, do not use mouthwash. Mouthwash contains alcohol and can dry out the mouth even more.  Clean the area between the genitals and the anus (perineal area) after urination and bowel movements. Women need to wipe from front to back.  Use a water soluble lubricant during sexual intercourse and practice good hygiene after. Do not have intercourse if you are severely neutropenic. Check with your caregiver for guidelines.  Exercise daily as tolerated.  Avoid people who were vaccinated with a live vaccine in the past 30 days. You should not receive live vaccines (polio, typhoid).  Do not provide direct care for pets. Avoid animal droppings. Do not clean litter boxes and bird cages.  Do not share food utensils.  Do not use tampons, enemas, or rectal suppositories unless directed by your caregiver.  Use an electric razor to remove hair.  Wash your hands after handling magazines, letters, and newspapers. SEEK IMMEDIATE MEDICAL CARE IF:   You have a fever.  You have chills or start to shake.  You feel nauseous or vomit.  You develop mouth sores.  You develop aches and pains.  You have redness and swelling around open wounds.  Your skin is warm to the touch.  You have pus coming from your wounds.  You develop swollen lymph nodes.  You feel weak or fatigued.  You develop  red streaks on the skin. MAKE SURE YOU:  Understand these instructions.  Will watch your condition.  Will get help right away if you are not doing well or get worse.   This information is not intended to replace advice given to you by your health care provider. Make sure you discuss any questions you have with your health care provider.   Document Released: 07/04/2001 Document Revised: 04/06/2011 Document Reviewed: 07/25/2014 Elsevier Interactive Patient Education [employer].

## 2015-06-20 LAB — TYPE AND SCREEN
ABO/RH(D): A POS
ANTIBODY SCREEN: NEGATIVE
UNIT DIVISION: 0
Unit division: 0

## 2015-06-27 ENCOUNTER — Ambulatory Visit (HOSPITAL_COMMUNITY)
Admission: RE | Admit: 2015-06-27 | Discharge: 2015-06-27 | Disposition: A | Discharge status: Home / Self Care | Payer: Medicare Other | Source: Ambulatory Visit | Attending: Oncology | Admitting: Oncology

## 2015-06-27 DIAGNOSIS — D469 Myelodysplastic syndrome, unspecified: Secondary | ICD-10-CM | POA: Insufficient documentation

## 2015-06-30 ENCOUNTER — Observation Stay (HOSPITAL_COMMUNITY)
Admission: EM | Admit: 2015-06-30 | Discharge: 2015-07-03 | Disposition: A | Discharge status: Home / Self Care | Payer: Medicare Other | Attending: Internal Medicine | Admitting: Internal Medicine

## 2015-06-30 ENCOUNTER — Emergency Department (HOSPITAL_COMMUNITY): Payer: Medicare Other

## 2015-06-30 ENCOUNTER — Encounter (HOSPITAL_COMMUNITY): Payer: Self-pay | Admitting: Oncology

## 2015-06-30 ENCOUNTER — Other Ambulatory Visit: Payer: Self-pay

## 2015-06-30 DIAGNOSIS — Z7984 Long term (current) use of oral hypoglycemic drugs: Secondary | ICD-10-CM | POA: Insufficient documentation

## 2015-06-30 DIAGNOSIS — Z87891 Personal history of nicotine dependence: Secondary | ICD-10-CM | POA: Insufficient documentation

## 2015-06-30 DIAGNOSIS — I110 Hypertensive heart disease with heart failure: Secondary | ICD-10-CM | POA: Insufficient documentation

## 2015-06-30 DIAGNOSIS — I5032 Chronic diastolic (congestive) heart failure: Secondary | ICD-10-CM

## 2015-06-30 DIAGNOSIS — Z7951 Long term (current) use of inhaled steroids: Secondary | ICD-10-CM | POA: Diagnosis not present

## 2015-06-30 DIAGNOSIS — I480 Paroxysmal atrial fibrillation: Secondary | ICD-10-CM | POA: Diagnosis not present

## 2015-06-30 DIAGNOSIS — R509 Fever, unspecified: Secondary | ICD-10-CM | POA: Diagnosis present

## 2015-06-30 DIAGNOSIS — J40 Bronchitis, not specified as acute or chronic: Secondary | ICD-10-CM | POA: Diagnosis present

## 2015-06-30 DIAGNOSIS — E872 Acidosis, unspecified: Secondary | ICD-10-CM | POA: Diagnosis present

## 2015-06-30 DIAGNOSIS — Z79899 Other long term (current) drug therapy: Secondary | ICD-10-CM | POA: Diagnosis not present

## 2015-06-30 DIAGNOSIS — Z950 Presence of cardiac pacemaker: Secondary | ICD-10-CM | POA: Insufficient documentation

## 2015-06-30 DIAGNOSIS — J449 Chronic obstructive pulmonary disease, unspecified: Secondary | ICD-10-CM | POA: Insufficient documentation

## 2015-06-30 DIAGNOSIS — Z8572 Personal history of non-Hodgkin lymphomas: Secondary | ICD-10-CM | POA: Diagnosis not present

## 2015-06-30 DIAGNOSIS — I441 Atrioventricular block, second degree: Secondary | ICD-10-CM | POA: Diagnosis not present

## 2015-06-30 DIAGNOSIS — D61818 Other pancytopenia: Secondary | ICD-10-CM | POA: Insufficient documentation

## 2015-06-30 DIAGNOSIS — R531 Weakness: Secondary | ICD-10-CM

## 2015-06-30 DIAGNOSIS — Z66 Do not resuscitate: Secondary | ICD-10-CM | POA: Insufficient documentation

## 2015-06-30 DIAGNOSIS — R079 Chest pain, unspecified: Secondary | ICD-10-CM

## 2015-06-30 DIAGNOSIS — Z7982 Long term (current) use of aspirin: Secondary | ICD-10-CM | POA: Diagnosis not present

## 2015-06-30 DIAGNOSIS — D638 Anemia in other chronic diseases classified elsewhere: Secondary | ICD-10-CM | POA: Diagnosis not present

## 2015-06-30 DIAGNOSIS — R739 Hyperglycemia, unspecified: Secondary | ICD-10-CM

## 2015-06-30 DIAGNOSIS — J440 Chronic obstructive pulmonary disease with acute lower respiratory infection: Secondary | ICD-10-CM

## 2015-06-30 DIAGNOSIS — D469 Myelodysplastic syndrome, unspecified: Secondary | ICD-10-CM | POA: Diagnosis not present

## 2015-06-30 DIAGNOSIS — J209 Acute bronchitis, unspecified: Secondary | ICD-10-CM | POA: Diagnosis not present

## 2015-06-30 LAB — CBC WITH DIFFERENTIAL/PLATELET
BASOS ABS: 0 10*3/uL (ref 0.0–0.1)
BASOS PCT: 0 %
EOS ABS: 0 10*3/uL (ref 0.0–0.7)
Eosinophils Relative: 0 %
HCT: 23.4 % — ABNORMAL LOW (ref 39.0–52.0)
Hemoglobin: 8.2 g/dL — ABNORMAL LOW (ref 13.0–17.0)
LYMPHS PCT: 44 %
Lymphs Abs: 0.7 10*3/uL (ref 0.7–4.0)
MCH: 34.5 pg — ABNORMAL HIGH (ref 26.0–34.0)
MCHC: 35 g/dL (ref 30.0–36.0)
MCV: 98.3 fL (ref 78.0–100.0)
MONOS PCT: 1 %
Monocytes Absolute: 0 10*3/uL — ABNORMAL LOW (ref 0.1–1.0)
NEUTROS PCT: 55 %
NRBC: 2 /100{WBCs} — AB
Neutro Abs: 1 10*3/uL — ABNORMAL LOW (ref 1.7–7.7)
PLATELETS: 64 10*3/uL — AB (ref 150–400)
RBC: 2.38 MIL/uL — ABNORMAL LOW (ref 4.22–5.81)
RDW: 22 % — AB (ref 11.5–15.5)
WBC: 1.7 10*3/uL — ABNORMAL LOW (ref 4.0–10.5)

## 2015-06-30 LAB — URINALYSIS, ROUTINE W REFLEX MICROSCOPIC
BILIRUBIN URINE: NEGATIVE
Glucose, UA: NEGATIVE mg/dL
HGB URINE DIPSTICK: NEGATIVE
Ketones, ur: NEGATIVE mg/dL
Leukocytes, UA: NEGATIVE
Nitrite: NEGATIVE
PH: 5.5 (ref 5.0–8.0)
Protein, ur: NEGATIVE mg/dL
SPECIFIC GRAVITY, URINE: 1.027 (ref 1.005–1.030)

## 2015-06-30 LAB — TROPONIN I

## 2015-06-30 LAB — COMPREHENSIVE METABOLIC PANEL
ALT: 24 U/L (ref 17–63)
AST: 19 U/L (ref 15–41)
Albumin: 3.9 g/dL (ref 3.5–5.0)
Alkaline Phosphatase: 29 U/L — ABNORMAL LOW (ref 38–126)
Anion gap: 9 (ref 5–15)
BUN: 20 mg/dL (ref 6–20)
CHLORIDE: 100 mmol/L — AB (ref 101–111)
CO2: 25 mmol/L (ref 22–32)
CREATININE: 0.95 mg/dL (ref 0.61–1.24)
Calcium: 9.2 mg/dL (ref 8.9–10.3)
GFR calc Af Amer: >60 mL/min (ref >60)
Glucose, Bld: 231 mg/dL — ABNORMAL HIGH (ref 65–99)
POTASSIUM: 3.8 mmol/L (ref 3.5–5.1)
SODIUM: 134 mmol/L — AB (ref 135–145)
Total Bilirubin: 0.5 mg/dL (ref 0.3–1.2)
Total Protein: 6.9 g/dL (ref 6.5–8.1)

## 2015-06-30 LAB — I-STAT CG4 LACTIC ACID, ED
LACTIC ACID, VENOUS: 1.45 mmol/L (ref 0.5–2.0)
Lactic Acid, Venous: 2.52 mmol/L (ref 0.5–2.0)

## 2015-06-30 MED ORDER — SODIUM CHLORIDE 0.9 % IV BOLUS (SEPSIS)
1000.0000 mL | Freq: Once | INTRAVENOUS | Status: AC
  Administered 2015-06-30: 1000 mL via INTRAVENOUS

## 2015-06-30 MED ORDER — SODIUM CHLORIDE 0.9 % IV SOLN
INTRAVENOUS | Status: DC
  Administered 2015-07-01: 01:00:00 via INTRAVENOUS

## 2015-06-30 NOTE — ED Provider Notes (Signed)
CSN: [id number]     Arrival date & time 06/30/15  1912 History   First MD Initiated Contact with Patient 06/30/15 2013     Chief Complaint  Patient presents with  . Fever     (Consider location/radiation/quality/duration/timing/severity/associated sxs/prior Treatment) HPI   Patient has a PMH of second degree Mobitz II AV block, paroxysmal a. fib, and has Myelodysplastic Syndrome for which he recieves intermittent (biweekly) blood transutions of PRBC to keep his hemoglobin above 9.   He presents to  the ER with complaints of fever since this past Friday. Per the pt it has been as high 100.3 orally (triage note says 103 but I verified and it was 100.3) on Saturday. He has been taking Tylenol at home for fever control. He has been having body aches, chest pressure- which he describes as indegestion. He has been short of breath and fatigued. + nausea  He denies having CP,  vomiting, diarrhea, rectal bleeding, recent fall, confusion, change in vision, focal weakness, new or worsening lower extremity edema, back pains, rashes or wounds.   Past Medical History  Diagnosis Date  . Second degree Mobitz II AV block 12/11/08    with transient syncope, resolved s/p PPM  . Paroxysmal atrial fibrillation ([place]) 03/16/11    diagnosed by PPM interrogation   Past Surgical History  Procedure Laterality Date  . Lymph node biopsy  04/2005    groin  . Sternotomy  2000  . Pericardiectomy  2000  . Penile prosthesis implant      and removal  . Pacemaker insertion  12/11/08    MDT implanted by Dr Rayann Heman  . Tee without cardioversion N/A 10/31/2012    Procedure: TRANSESOPHAGEAL ECHOCARDIOGRAM (TEE);  Surgeon: Lelon Perla, MD;  Location: [facility] ENDOSCOPY;  Service: Cardiovascular;  Laterality: N/A;  . Pacemaker lead removal Left 11/04/2012    Procedure: PACEMAKER LEAD REMOVAL;  Surgeon: Evans Lance, MD;  Location: [place];  Service: Cardiovascular;  Laterality: Left;  . Temporary pacemaker insertion Left  11/04/2012    Procedure: TEMPORARY PACEMAKER INSERTION;  Surgeon: Evans Lance, MD;  Location: [place];  Service: Cardiovascular;  Laterality: Left;  . Insert / replace / remove pacemaker  12/29/2012  . Bi-ventricular pacemaker upgrade N/A 12/29/2012    Procedure: BI-VENTRICULAR PACEMAKER UPGRADE;  Surgeon: Evans Lance, MD;  Location: [facility] CATH LAB;  Service: Cardiovascular;  Laterality: N/A;   Family History  Problem Relation Age of Onset  . Diabetes Mother   . Liver cancer Brother   . Cancer Sister    Social History  Substance Use Topics  . Smoking status: Former Smoker -- 1.00 packs/day for 30 years    Types: Cigarettes    Quit date: 01/27/1968  . Smokeless tobacco: Former Systems developer    Types: Chew  . Alcohol Use: No    Review of Systems  Review of Systems All other systems negative except as documented in the HPI. All pertinent positives and negatives as reviewed in the HPI.   Allergies  Review of patient's allergies indicates no known allergies.  Home Medications   Prior to Admission medications   Medication Sig Start Date End Date Taking? Authorizing Provider  acetaminophen (TYLENOL) 500 MG tablet Take 500-1,000 mg by mouth every 6 (six) hours as needed for mild pain or headache. Reported on 03/05/2015   Yes Historical Provider, MD  albuterol (PROVENTIL HFA;VENTOLIN HFA) 108 (90 BASE) MCG/ACT inhaler Inhale 1-2 puffs into the lungs every 4 (four) hours as needed for  wheezing or shortness of breath. Patient taking differently: Inhale 2 puffs into the lungs every 4 (four) hours as needed for wheezing or shortness of breath.  02/20/14  Yes Tammy S Parrett, NP  albuterol (PROVENTIL) (2.5 MG/3ML) 0.083% nebulizer solution Take 3 mLs (2.5 mg total) by nebulization every 4 (four) hours as needed for wheezing or shortness of breath. 01/29/15  Yes Tanda Rockers, MD  ALPRAZolam Duanne Moron) 0.25 MG tablet Take 1 tablet (0.25 mg total) by mouth at bedtime as needed for anxiety. Patient taking  differently: Take 0.25 mg by mouth at bedtime as needed for anxiety (and insomnia).  03/05/15  Yes Tammy S Parrett, NP  aspirin 81 MG tablet Take 81 mg by mouth every morning. (hold if bleeding)   Yes Historical Provider, MD  calcium-vitamin D (OS-CAL 500 + D) 500-200 MG-UNIT per tablet Take 1 tablet by mouth 2 (two) times daily.    Yes Historical Provider, MD  cetirizine (ZYRTEC) 10 MG tablet Take 10 mg by mouth daily as needed for allergies.    Yes Historical Provider, MD  chlorpheniramine (CHLOR-TRIMETON) 4 MG tablet Take 4 mg by mouth every 4 (four) hours as needed for allergies.   Yes Historical Provider, MD  Cholecalciferol (VITAMIN D) 1000 UNITS capsule Take 1,000 Units by mouth 2 (two) times daily.     Yes Historical Provider, MD  dextromethorphan-guaiFENesin (MUCINEX DM) 30-600 MG 12hr tablet Take 1 tablet by mouth 2 (two) times daily as needed for cough.    Yes Historical Provider, MD  docusate sodium (COLACE) 100 MG capsule Take 100 mg by mouth daily as needed for moderate constipation.    Yes Historical Provider, MD  famotidine (PEPCID) 20 MG tablet Take 20 mg by mouth at bedtime.    Yes Historical Provider, MD  finasteride (PROSCAR) 5 MG tablet TAKE 1 TABLET BY MOUTH EVERY MORNING 06/11/15  Yes Tammy S Parrett, NP  fluticasone (FLONASE) 50 MCG/ACT nasal spray 1-2 puffs each nostril once or twice daily for allergy 04/12/15  Yes Clinton D Young, MD  furosemide (LASIX) 40 MG tablet TAKE 1 TABLET BY MOUTH EVERY DAY MAY TAKE 1 EXTRA TABLET DAILY IF NEEDED FOR SWELLING IN LEGS Patient taking differently: TAKE 40-80mg   BY MOUTH once daily.  Take 40mg  daily, and if needed pt may take an extra 40mg  for swelling in legs 04/24/15  Yes Tanda Rockers, MD  guaiFENesin-dextromethorphan [facility] DM) 100-10 MG/5ML syrup Take 5 mLs by mouth every 4 (four) hours as needed for cough. 01/22/15  Yes Nishant Dhungel, MD  hydrOXYzine (ATARAX/VISTARIL) 25 MG tablet Take 1-2 tablets by mouth every 4-6 hours as  needed for itching   Yes Historical Provider, MD  KLOR-CON M20 20 MEQ tablet TAKE 1 TABLET BY MOUTH EVERY DAY Patient taking differently: TAKE 1 TABLET BY MOUTH EVERY MORNING 01/09/15  Yes Tanda Rockers, MD  levothyroxine (SYNTHROID, LEVOTHROID) 25 MCG tablet TAKE 1 TABLET BY MOUTH EVERY MORNING BEFORE BREAKFAST 02/18/15  Yes Tanda Rockers, MD  lidocaine-prilocaine (EMLA) cream Apply 1 application topically as needed. Patient taking differently: Apply 1 application topically daily as needed (port access).  12/05/14  Yes Wyatt Portela, MD  metFORMIN (GLUCOPHAGE) 500 MG tablet TAKE 1 TABLET BY MOUTH TWICE A DAY WITH FOOD 05/22/15  Yes Tanda Rockers, MD  mometasone-formoterol [facility]) 200-5 MCG/ACT AERO Take 2 puffs first thing in am and then another 2 puffs about 12 hours later. Patient taking differently: Inhale 2 puffs into the lungs every 12 (  twelve) hours.  05/13/15  Yes Tanda Rockers, MD  Multiple Vitamin (MULTIVITAMIN) tablet Take 1 tablet by mouth every morning.    Yes Historical Provider, MD  nitroGLYCERIN (NITROSTAT) 0.4 MG SL tablet Place 1 tablet (0.4 mg total) under the tongue every 5 (five) minutes as needed for chest pain. 06/04/14  Yes Thompson Grayer, MD  omeprazole (PRILOSEC) 20 MG capsule Take 40 mg by mouth daily before breakfast.    Yes Historical Provider, MD  oxybutynin (DITROPAN-XL) 10 MG 24 hr tablet Take 10 mg by mouth every morning. Reported on 05/13/2015   Yes Historical Provider, MD  OXYGEN 2 lpm with sleep   Yes Historical Provider, MD  polyethylene glycol (MIRALAX / GLYCOLAX) packet Take 17 g by mouth daily as needed for moderate constipation or severe constipation.    Yes Historical Provider, MD  sulindac (CLINORIL) 200 MG tablet TAKE 1 TABLET BY MOUTH TWICE DAILY AS NEEDED FOR PAIN 04/05/15  Yes Tanda Rockers, MD  traMADol (ULTRAM) 50 MG tablet Take 1/2 to one  tablet by mouth every 4 hours as needed for severe pain or cough Patient taking differently: Take 50-100 mg by  mouth every 4 (four) hours as needed for severe pain (or cough).  07/18/14  Yes Tanda Rockers, MD  traZODone (DESYREL) 150 MG tablet 2/3 to 1 tablet at bedtime Patient taking differently: Take 150 mg by mouth at bedtime.  01/02/15  Yes Tanda Rockers, MD   BP 132/81 mmHg  Pulse 92  Temp(Src) 99.8 F (37.7 C) (Rectal)  Resp 25  Ht 5\' 11"  (1.803 m)  Wt 111.131 kg  BMI 34.19 kg/m2  SpO2 97% Physical Exam  Constitutional: He is oriented to person, place, and time. He appears well-developed and well-nourished. He appears ill. No distress.  HENT:  Head: Normocephalic and atraumatic.  Right Ear: Tympanic membrane and ear canal normal.  Left Ear: Tympanic membrane and ear canal normal.  Nose: Nose normal.  Mouth/Throat: Uvula is midline, oropharynx is clear and moist and mucous membranes are normal.  Eyes: Conjunctivae and lids are normal. Pupils are equal, round, and reactive to light.  Neck: Normal range of motion. Neck supple. No spinous process tenderness and no muscular tenderness present.  Cardiovascular: Normal rate and regular rhythm.   Pulmonary/Chest: Effort normal. No accessory muscle usage. No tachypnea and no bradypnea. No respiratory distress. He has no decreased breath sounds. He has no wheezes. He has no rhonchi. He has no rales.  Abdominal: Soft. Bowel sounds are normal. He exhibits no distension. There is no tenderness. There is no rigidity, no rebound, no guarding and no CVA tenderness.  No signs of abdominal distention  Musculoskeletal:  No LE swelling  Neurological: He is alert and oriented to person, place, and time.  Acting at baseline. Globally mildly weak  Skin: Skin is warm and dry. No rash noted. There is pallor.  Chronic skin changes  Nursing note and vitals reviewed.   ED Course  Procedures (including critical care time) Labs Review Labs Reviewed  COMPREHENSIVE METABOLIC PANEL - Abnormal; Notable for the following:    Sodium 134 (*)    Chloride 100 (*)     Glucose, Bld 231 (*)    Alkaline Phosphatase 29 (*)    All other components within normal limits  CBC WITH DIFFERENTIAL/PLATELET - Abnormal; Notable for the following:    WBC 1.7 (*)    RBC 2.38 (*)    Hemoglobin 8.2 (*)    HCT 23.4 (*)  MCH 34.5 (*)    RDW 22.0 (*)    Platelets 64 (*)    nRBC 2 (*)    Neutro Abs 1.0 (*)    Monocytes Absolute 0.0 (*)    All other components within normal limits  I-STAT CG4 LACTIC ACID, ED - Abnormal; Notable for the following:    Lactic Acid, Venous 2.52 (*)    All other components within normal limits  CULTURE, BLOOD (ROUTINE X 2)  CULTURE, BLOOD (ROUTINE X 2)  URINE CULTURE  URINALYSIS, ROUTINE W REFLEX MICROSCOPIC (NOT AT [facility])  TROPONIN I  I-STAT CG4 LACTIC ACID, ED    Imaging Review Dg Chest 2 View  06/30/2015  CLINICAL DATA:  Recent dry cough with shortness of breath for a few days. History of diabetes and hypertension. EXAM: CHEST  2 VIEW COMPARISON:  05/13/2015 and 02/12/2015 radiographs. FINDINGS: The heart size and mediastinal contours are stable status post CABG. Right subclavian pacemaker leads are unchanged within the right atrium and right ventricle. The right IJ Port-A-Cath tip is unchanged near the SVC right atrial junction. There is stable mild atelectasis at both lung bases. No confluent airspace opacity, edema or significant pleural effusion. The bones appear unchanged. IMPRESSION: No acute cardiopulmonary process or significant changes. Stable cardiomegaly. Electronically Signed   By: Richardean Sale M.D.   On: 06/30/2015 19:57   I have personally reviewed and evaluated these images and lab results as part of my medical decision-making.   EKG Interpretation None     MDM   Final diagnoses:  Weakness  Chest pain, unspecified chest pain type   I spoke with Dr. Mariea Clonts, he recommends neutropenic fever treatment but no antibiotics at this time. He says that if the plan is to admit he recommends observation and to have Dr.  Osker Mason see patient in the hospital tomorrow.   Dr. Osker Mason will need to be paged in the morning. Troponin and EKG are unremarkable for acute change in the ED.  Dr. Sharene Butters has agreed to admit for obs, tele, [facility] admits, [facility].  Filed Vitals:   06/30/15 2220 06/30/15 2230  BP: 110/83 132/81  Pulse: 91 92  Temp:    Resp: 22 [address] PA-C 06/30/15 2317  Orlie Dakin, MD 07/01/15 [id number]

## 2015-06-30 NOTE — ED Notes (Signed)
Pt has been running a fever intermittently since Friday.  T max of 103 oral on Saturday.  Pt has been managing w/ tylenol at home.  Lung sounds diminished, fine crackles appreciated at bases b/l.  Generalized body aches.  Also reports dark urine, denies any other urinary sx.

## 2015-06-30 NOTE — H&P (Signed)
[name] [id number] DOB: 1932-07-10 DOA: 06/30/2015     PCP: Christinia Gully, MD   Outpatient Specialists: Oncology [facility]add[facility] Patient coming from: home Lives alone, family come over twice a day    Chief Complaint: fatigue low grade fever  HPI: [name] is a 82 y.o. male with medical history significant of lymphoma and myelodysplastic disorder  Chronic lactic acidosis, pancytopenia, anemia requiring transfusion support, chronic diastolic heart failure, status post pacemaker due to Mobitz II AV block syncope, diabetes mellitus hemoglobin A1c 5.5 2012  Presented with 3 day history of intermittent low-grade fevers up to 100.3 using Tylenol some dry cough. Having overall body aches and feeling very weak. His urine has been darker than usual. Lightheaded when he stands up.  He reports intermittent chest pressure similar to indigestion and increased shortness of breath and some nausea. He denies any melena or blood in stool no episodes of bleeding or hemoptysis. Reports "upper body pressure". No chest pain in particular. Denies any falls. No sick contacts.   No associated chest pain vomiting or diarrhea no urinary complaints no dysuria no neurological complaints   Regarding pertinent Chronic problems: Regarding myeloproliferative syndrome patient has been on biweekly blood transfusions to keep hemoglobin above 9. Lack of response to Aranesp. Vitals if patient develops recurrent infections to add growth factor support Patient was last seen in the office in 26th of April has been transfused less than 2 weeks ago per family due this Wednesday again    IN ER: Afebrile, heart rate up to 106 aspirations after 27 blood pressure 110/83 WBC 1.7 with ANC 1.0 hemoglobin 8.2 platelets 64 creatinine 0.95 glucose 231 lactic acid 2.5 which is close to his baseline. troponin is unremarkable He was given 1 L normal saline bolus repeat lactic acid down to 1.4 [place] was discussed with hematology  oncology by EER provider who recommended admission for observation holding off on IV antibiotics for now and have r. Shadad follow up in the morning Chest x-ray unremarkable  Hospitalist was called for admission for symptomatic anemia will dehydration low-grade fevers  Review of Systems:    Pertinent positives include: Fevers, chills, fatigue,  Constitutional:  No weight loss, night sweats,  weight loss  HEENT:  No headaches, Difficulty swallowing,Tooth/dental problems,Sore throat,  No sneezing, itching, ear ache, nasal congestion, post nasal drip,  Cardio-vascular:  No chest pain, Orthopnea, PND, anasarca, dizziness, palpitations.no Bilateral lower extremity swelling  GI:  No heartburn, indigestion, abdominal pain, nausea, vomiting, diarrhea, change in bowel habits, loss of appetite, melena, blood in stool, hematemesis Resp:  no shortness of breath at rest. No dyspnea on exertion, No excess mucus, no productive cough, No non-productive cough, No coughing up of blood.No change in color of mucus.No wheezing. Skin:  no rash or lesions. No jaundice GU:  no dysuria, change in color of urine, no urgency or frequency. No straining to urinate.  No flank pain.  Musculoskeletal:  No joint pain or no joint swelling. No decreased range of motion. No back pain.  Psych:  No change in mood or affect. No depression or anxiety. No memory loss.  Neuro: no localizing neurological complaints, no tingling, no weakness, no double vision, no gait abnormality, no slurred speech, no confusion  As per HPI otherwise 10 point review of systems negative.   Past Medical History: Past Medical History  Diagnosis Date  . Second degree Mobitz II AV block 12/11/08    with transient syncope, resolved s/p PPM  . Paroxysmal  atrial fibrillation ([place]) 03/16/11    diagnosed by PPM interrogation   Past Surgical History  Procedure Laterality Date  . Lymph node biopsy  04/2005    groin  . Sternotomy  2000  .  Pericardiectomy  2000  . Penile prosthesis implant      and removal  . Pacemaker insertion  12/11/08    MDT implanted by Dr Rayann Heman  . Tee without cardioversion N/A 10/31/2012    Procedure: TRANSESOPHAGEAL ECHOCARDIOGRAM (TEE);  Surgeon: Lelon Perla, MD;  Location: [facility] ENDOSCOPY;  Service: Cardiovascular;  Laterality: N/A;  . Pacemaker lead removal Left 11/04/2012    Procedure: PACEMAKER LEAD REMOVAL;  Surgeon: Evans Lance, MD;  Location: [place];  Service: Cardiovascular;  Laterality: Left;  . Temporary pacemaker insertion Left 11/04/2012    Procedure: TEMPORARY PACEMAKER INSERTION;  Surgeon: Evans Lance, MD;  Location: [place];  Service: Cardiovascular;  Laterality: Left;  . Insert / replace / remove pacemaker  12/29/2012  . Bi-ventricular pacemaker upgrade N/A 12/29/2012    Procedure: BI-VENTRICULAR PACEMAKER UPGRADE;  Surgeon: Evans Lance, MD;  Location: [facility] CATH LAB;  Service: Cardiovascular;  Laterality: N/A;     Social History:  Ambulatory   walker     reports that he quit smoking about 47 years ago. His smoking use included Cigarettes. He has a 30 pack-year smoking history. He has quit using smokeless tobacco. His smokeless tobacco use included Chew. He reports that he does not drink alcohol or use illicit drugs.  Allergies:  No Known Allergies     Family History:  Family History  Problem Relation Age of Onset  . Diabetes Mother   . Liver cancer Brother   . Cancer Sister     Medications: Prior to Admission medications   Medication Sig Start Date End Date Taking? Authorizing Provider  acetaminophen (TYLENOL) 500 MG tablet Take 500-1,000 mg by mouth every 6 (six) hours as needed for mild pain or headache. Reported on 03/05/2015   Yes Historical Provider, MD  albuterol (PROVENTIL HFA;VENTOLIN HFA) 108 (90 BASE) MCG/ACT inhaler Inhale 1-2 puffs into the lungs every 4 (four) hours as needed for wheezing or shortness of breath. Patient taking differently: Inhale 2 puffs  into the lungs every 4 (four) hours as needed for wheezing or shortness of breath.  02/20/14  Yes Tammy S Parrett, NP  albuterol (PROVENTIL) (2.5 MG/3ML) 0.083% nebulizer solution Take 3 mLs (2.5 mg total) by nebulization every 4 (four) hours as needed for wheezing or shortness of breath. 01/29/15  Yes Tanda Rockers, MD  ALPRAZolam Duanne Moron) 0.25 MG tablet Take 1 tablet (0.25 mg total) by mouth at bedtime as needed for anxiety. Patient taking differently: Take 0.25 mg by mouth at bedtime as needed for anxiety (and insomnia).  03/05/15  Yes Tammy S Parrett, NP  aspirin 81 MG tablet Take 81 mg by mouth every morning. (hold if bleeding)   Yes Historical Provider, MD  calcium-vitamin D (OS-CAL 500 + D) 500-200 MG-UNIT per tablet Take 1 tablet by mouth 2 (two) times daily.    Yes Historical Provider, MD  cetirizine (ZYRTEC) 10 MG tablet Take 10 mg by mouth daily as needed for allergies.    Yes Historical Provider, MD  chlorpheniramine (CHLOR-TRIMETON) 4 MG tablet Take 4 mg by mouth every 4 (four) hours as needed for allergies.   Yes Historical Provider, MD  Cholecalciferol (VITAMIN D) 1000 UNITS capsule Take 1,000 Units by mouth 2 (two) times daily.     Yes  Historical Provider, MD  dextromethorphan-guaiFENesin (MUCINEX DM) 30-600 MG 12hr tablet Take 1 tablet by mouth 2 (two) times daily as needed for cough.    Yes Historical Provider, MD  docusate sodium (COLACE) 100 MG capsule Take 100 mg by mouth daily as needed for moderate constipation.    Yes Historical Provider, MD  famotidine (PEPCID) 20 MG tablet Take 20 mg by mouth at bedtime.    Yes Historical Provider, MD  finasteride (PROSCAR) 5 MG tablet TAKE 1 TABLET BY MOUTH EVERY MORNING 06/11/15  Yes Tammy S Parrett, NP  fluticasone (FLONASE) 50 MCG/ACT nasal spray 1-2 puffs each nostril once or twice daily for allergy 04/12/15  Yes Clinton D Young, MD  furosemide (LASIX) 40 MG tablet TAKE 1 TABLET BY MOUTH EVERY DAY MAY TAKE 1 EXTRA TABLET DAILY IF NEEDED FOR  SWELLING IN LEGS Patient taking differently: TAKE 40-80mg   BY MOUTH once daily.  Take 40mg  daily, and if needed pt may take an extra 40mg  for swelling in legs 04/24/15  Yes Tanda Rockers, MD  guaiFENesin-dextromethorphan [facility] DM) 100-10 MG/5ML syrup Take 5 mLs by mouth every 4 (four) hours as needed for cough. 01/22/15  Yes Nishant Dhungel, MD  hydrOXYzine (ATARAX/VISTARIL) 25 MG tablet Take 1-2 tablets by mouth every 4-6 hours as needed for itching   Yes Historical Provider, MD  KLOR-CON M20 20 MEQ tablet TAKE 1 TABLET BY MOUTH EVERY DAY Patient taking differently: TAKE 1 TABLET BY MOUTH EVERY MORNING 01/09/15  Yes Tanda Rockers, MD  levothyroxine (SYNTHROID, LEVOTHROID) 25 MCG tablet TAKE 1 TABLET BY MOUTH EVERY MORNING BEFORE BREAKFAST 02/18/15  Yes Tanda Rockers, MD  lidocaine-prilocaine (EMLA) cream Apply 1 application topically as needed. Patient taking differently: Apply 1 application topically daily as needed (port access).  12/05/14  Yes Wyatt Portela, MD  metFORMIN (GLUCOPHAGE) 500 MG tablet TAKE 1 TABLET BY MOUTH TWICE A DAY WITH FOOD 05/22/15  Yes Tanda Rockers, MD  mometasone-formoterol [facility]) 200-5 MCG/ACT AERO Take 2 puffs first thing in am and then another 2 puffs about 12 hours later. Patient taking differently: Inhale 2 puffs into the lungs every 12 (twelve) hours.  05/13/15  Yes Tanda Rockers, MD  Multiple Vitamin (MULTIVITAMIN) tablet Take 1 tablet by mouth every morning.    Yes Historical Provider, MD  nitroGLYCERIN (NITROSTAT) 0.4 MG SL tablet Place 1 tablet (0.4 mg total) under the tongue every 5 (five) minutes as needed for chest pain. 06/04/14  Yes Thompson Grayer, MD  omeprazole (PRILOSEC) 20 MG capsule Take 40 mg by mouth daily before breakfast.    Yes Historical Provider, MD  oxybutynin (DITROPAN-XL) 10 MG 24 hr tablet Take 10 mg by mouth every morning. Reported on 05/13/2015   Yes Historical Provider, MD  OXYGEN 2 lpm with sleep   Yes Historical Provider, MD    polyethylene glycol (MIRALAX / GLYCOLAX) packet Take 17 g by mouth daily as needed for moderate constipation or severe constipation.    Yes Historical Provider, MD  sulindac (CLINORIL) 200 MG tablet TAKE 1 TABLET BY MOUTH TWICE DAILY AS NEEDED FOR PAIN 04/05/15  Yes Tanda Rockers, MD  traMADol (ULTRAM) 50 MG tablet Take 1/2 to one  tablet by mouth every 4 hours as needed for severe pain or cough Patient taking differently: Take 50-100 mg by mouth every 4 (four) hours as needed for severe pain (or cough).  07/18/14  Yes Tanda Rockers, MD  traZODone (DESYREL) 150 MG tablet 2/3 to 1  tablet at bedtime Patient taking differently: Take 150 mg by mouth at bedtime.  01/02/15  Yes Tanda Rockers, MD    Physical Exam: Patient Vitals for the past 24 hrs:  BP Temp Temp src Pulse Resp SpO2 Height Weight  06/30/15 2220 110/83 mmHg - - 91 22 96 % - -  06/30/15 2200 110/83 mmHg - - 100 21 99 % - -  06/30/15 2130 132/94 mmHg - - 96 22 98 % - -  06/30/15 2100 (!) 150/121 mmHg - - 101 (!) 27 98 % - -  06/30/15 2056 - 99.8 F (37.7 C) Rectal - - - - -  06/30/15 2040 138/82 mmHg - - 96 16 97 % - -  06/30/15 2030 138/82 mmHg - - 97 - 96 % - -  06/30/15 2001 128/72 mmHg - - - - - - -  06/30/15 1934 133/72 mmHg 98.3 F (36.8 C) Oral 106 22 96 % 5\' 11"  (1.803 m) 111.131 kg (245 lb)    1. General:  in No Acute distress 2. Psychological: Alert and  Oriented 3. Head/ENT:    Dry Mucous Membranes                          Head Non traumatic, neck supple                           Poor Dentition 4. SKIN: decreased Skin turgor,  Skin clean Dry and intact no rash 5. Heart: Regular rate and rhythm no   Murmur, Rub or gallop 6. Lungs: no wheezes ocasinal crackles   7. Abdomen: Soft, non-tender, Non distended 8. Lower extremities: no clubbing, cyanosis, trace edema reports much less than usual 9. Neurologically Grossly intact, moving all 4 extremities equally 10. MSK: Normal range of motion   body mass index is  34.19 kg/(m^2).  Labs on Admission:   Labs on Admission: I have personally reviewed following labs and imaging studies  CBC:  Recent Labs Lab 06/30/15 2003  WBC 1.7*  NEUTROABS 1.0*  HGB 8.2*  HCT 23.4*  MCV 98.3  PLT 64*   Basic Metabolic Panel:  Recent Labs Lab 06/30/15 1956  NA 134*  K 3.8  CL 100*  CO2 25  GLUCOSE 231*  BUN 20  CREATININE 0.95  CALCIUM 9.2   GFR: Estimated Creatinine Clearance: 78.6 mL/min (by C-G formula based on Cr of 0.95). Liver Function Tests:  Recent Labs Lab 06/30/15 1956  AST 19  ALT 24  ALKPHOS 29*  BILITOT 0.5  PROT 6.9  ALBUMIN 3.9   No results for input(s): LIPASE, AMYLASE in the last 168 hours. No results for input(s): AMMONIA in the last 168 hours. Coagulation Profile: No results for input(s): INR, PROTIME in the last 168 hours. Cardiac Enzymes:  Recent Labs Lab 06/30/15 1956  TROPONINI <0.03   BNP (last 3 results)  Recent Labs  05/13/15 1043  PROBNP 271.0*   HbA1C: No results for input(s): HGBA1C in the last 72 hours. CBG: No results for input(s): GLUCAP in the last 168 hours. Lipid Profile: No results for input(s): CHOL, HDL, LDLCALC, TRIG, CHOLHDL, LDLDIRECT in the last 72 hours. Thyroid Function Tests: No results for input(s): TSH, T4TOTAL, FREET4, T3FREE, THYROIDAB in the last 72 hours. Anemia Panel: No results for input(s): VITAMINB12, FOLATE, FERRITIN, TIBC, IRON, RETICCTPCT in the last 72 hours. Urine analysis:    Component Value Date/Time   COLORURINE  YELLOW 06/30/2015 2018   APPEARANCEUR CLEAR 06/30/2015 2018   LABSPEC 1.027 06/30/2015 2018   PHURINE 5.5 06/30/2015 2018   GLUCOSEU NEGATIVE 06/30/2015 2018   GLUCOSEU NEGATIVE 02/12/2015 1201   [place] 06/30/2015 2018   [place] NEGATIVE 06/30/2015 2018   KETONESUR NEGATIVE 06/30/2015 2018   PROTEINUR NEGATIVE 06/30/2015 2018   UROBILINOGEN 0.2 02/12/2015 1201   NITRITE NEGATIVE 06/30/2015 2018   LEUKOCYTESUR NEGATIVE  06/30/2015 2018   Sepsis Labs: @LABRCNTIP (procalcitonin:4,lacticidven:4) )No results found for this or any previous visit (from the past 240 hour(s)).     UA  no evidence of UTI  Lab Results  Component Value Date   HGBA1C 6.4 07/18/2014    Estimated Creatinine Clearance: 78.6 mL/min (by C-G formula based on Cr of 0.95).  BNP (last 3 results)  Recent Labs  05/13/15 1043  PROBNP 271.0*     ECG REPORT  Independently reviewed Rate:98  Rhythm: PACed ST&T Change: NA QTC 508  Filed Weights   06/30/15 1934  Weight: 111.131 kg (245 lb)     Cultures:    Component Value Date/Time   SDES URINE, CLEAN CATCH 01/18/2015 2144   [place] NONE 01/18/2015 2144   CULT  01/18/2015 1247    7,000 COLONIES/mL INSIGNIFICANT GROWTH Performed at [place] 01/21/2015 FINAL 01/18/2015 2144     Radiological Exams on Admission: Dg Chest 2 View  06/30/2015  CLINICAL DATA:  Recent dry cough with shortness of breath for a few days. History of diabetes and hypertension. EXAM: CHEST  2 VIEW COMPARISON:  05/13/2015 and 02/12/2015 radiographs. FINDINGS: The heart size and mediastinal contours are stable status post CABG. Right subclavian pacemaker leads are unchanged within the right atrium and right ventricle. The right IJ Port-A-Cath tip is unchanged near the SVC right atrial junction. There is stable mild atelectasis at both lung bases. No confluent airspace opacity, edema or significant pleural effusion. The bones appear unchanged. IMPRESSION: No acute cardiopulmonary process or significant changes. Stable cardiomegaly. Electronically Signed   By: Richardean Sale M.D.   On: 06/30/2015 19:57    Chart has been reviewed    Assessment/Plan  82 y.o. male with medical history significant of lymphoma and myelodysplastic disorder  Chronic lactic acidosis, pancytopenia, anemia requiring transfusion support, chronic diastolic heart failure, status post pacemaker due to  Mobitz II AV block syncope, diabetes mellitus hemoglobin A1c 5.5 (2012) admitted for dehydration and bronchitis in the setting of pancytopenia secondary to mild dysplastic syndrome  Present on Admission:  . Bronchitis - given mild cough and low-grade fevers will observe overnight and given immunosuppressive state. We'll treat with doxycycline albuterol as needed and Atrovent no wheezing will hold off on steroids  . Anemia of chronic disease secondary to mild dysplastic syndrome. Refer to oncology regarding when patient should be transfusedhim in the morning at this point no emergent indications for transfusion  . Chronic diastolic CHF (congestive heart failure) (HCC)  - hold off on Lasix for tonight this patient does appear to be orthostatic  . Lactic acidosis patient has ongoing lactic acidosis but currently improved with IV fluid resuscitation  . MDS (myelodysplastic syndrome) ([place]) as per oncology  . Pacemaker -  stable  . Pancytopenia ([place]) - running secondary to mild dysplastic syndrome  . Chest pain - morbid discomfort. Currently resolved patient states that he feels that she is chest is "full". We'll cycle cardiac enzymes given advanced age. Monitor on telemetry overnight suspect most likely pulmonary in etiology  history of diabetes mellitus  - by mouth medications,  order sliding scale    Other plan as per orders.  DVT prophylaxis:  SCD     Code Status:    DNR/DNI   as per patient   Family Communication:   Family not  at  Bedside  plan of care was discussed with Daughter [name] [phone]  Disposition Plan:   To home once workup is complete and patient is stable   Consults called: Oncology  Admission status:  obs    Level of care     tele         [place] 07/01/2015, 12:08 AM    [facility]  Pager [phone]   after 2 AM please page floor coverage PA If 7AM-7PM, please contact the day team taking care of the patient  [email or web address]  Password [email or web address]

## 2015-06-30 NOTE — ED Provider Notes (Signed)
Complains of generalized weakness, dyspnea on exertion. With maximum temperature of 100.3 yesterday. No other associated symptoms. On exam chronically ill-appearing HEENT exam no facial asymmetry neck supple no JVD lungs clear auscultation heart regular rate and rhythm abdomen obese nontender bilateral lower extremities with 1+ pretibial pitting edema. Neuro all 4 extremities neurovascularly intact. ED ECG REPORT   Date: 06/30/2015  Rate: 95  Rhythm: paced  QRS Axis: left  Intervals: normal  ST/T Wave abnormalities: nonspecific T wave changes  Conduction Disutrbances:none  Narrative Interpretation:   Old EKG Reviewed: unchanged No Significant change from 01/24/2015 I have personally reviewed the EKG tracing and agree with the computerized printout as noted.  Orlie Dakin, MD 07/01/15 [phone]

## 2015-07-01 ENCOUNTER — Other Ambulatory Visit: Payer: Self-pay

## 2015-07-01 DIAGNOSIS — D638 Anemia in other chronic diseases classified elsewhere: Secondary | ICD-10-CM | POA: Diagnosis not present

## 2015-07-01 DIAGNOSIS — J40 Bronchitis, not specified as acute or chronic: Secondary | ICD-10-CM | POA: Diagnosis not present

## 2015-07-01 DIAGNOSIS — E872 Acidosis: Secondary | ICD-10-CM | POA: Diagnosis not present

## 2015-07-01 DIAGNOSIS — D61818 Other pancytopenia: Secondary | ICD-10-CM | POA: Diagnosis not present

## 2015-07-01 DIAGNOSIS — D469 Myelodysplastic syndrome, unspecified: Secondary | ICD-10-CM | POA: Diagnosis not present

## 2015-07-01 DIAGNOSIS — I5032 Chronic diastolic (congestive) heart failure: Secondary | ICD-10-CM | POA: Diagnosis not present

## 2015-07-01 LAB — COMPREHENSIVE METABOLIC PANEL
ALBUMIN: 3.3 g/dL — AB (ref 3.5–5.0)
ALT: 20 U/L (ref 17–63)
ANION GAP: 6 (ref 5–15)
AST: 15 U/L (ref 15–41)
Alkaline Phosphatase: 24 U/L — ABNORMAL LOW (ref 38–126)
BUN: 15 mg/dL (ref 6–20)
CALCIUM: 8.5 mg/dL — AB (ref 8.9–10.3)
CO2: 28 mmol/L (ref 22–32)
Chloride: 103 mmol/L (ref 101–111)
Creatinine, Ser: 0.66 mg/dL (ref 0.61–1.24)
GFR calc Af Amer: >60 mL/min (ref >60)
Glucose, Bld: 134 mg/dL — ABNORMAL HIGH (ref 65–99)
POTASSIUM: 3.7 mmol/L (ref 3.5–5.1)
SODIUM: 137 mmol/L (ref 135–145)
TOTAL PROTEIN: 5.9 g/dL — AB (ref 6.5–8.1)
Total Bilirubin: 0.5 mg/dL (ref 0.3–1.2)

## 2015-07-01 LAB — TROPONIN I: Troponin I: <0.03 ng/mL (ref <0.031)

## 2015-07-01 LAB — GLUCOSE, CAPILLARY
GLUCOSE-CAPILLARY: 152 mg/dL — AB (ref 65–99)
GLUCOSE-CAPILLARY: 176 mg/dL — AB (ref 65–99)
GLUCOSE-CAPILLARY: 155 mg/dL — AB (ref 65–99)
GLUCOSE-CAPILLARY: 160 mg/dL — AB (ref 65–99)
Glucose-Capillary: 133 mg/dL — ABNORMAL HIGH (ref 65–99)

## 2015-07-01 LAB — CBC WITH DIFFERENTIAL/PLATELET
BASOS PCT: 0 %
Basophils Absolute: 0 10*3/uL (ref 0.0–0.1)
EOS ABS: 0 10*3/uL (ref 0.0–0.7)
Eosinophils Relative: 1 %
HEMATOCRIT: 20.3 % — AB (ref 39.0–52.0)
HEMOGLOBIN: 7.2 g/dL — AB (ref 13.0–17.0)
LYMPHS ABS: 0.5 10*3/uL — AB (ref 0.7–4.0)
Lymphocytes Relative: 52 %
MCH: 35 pg — AB (ref 26.0–34.0)
MCHC: 35.5 g/dL (ref 30.0–36.0)
MCV: 98.5 fL (ref 78.0–100.0)
MONO ABS: 0 10*3/uL — AB (ref 0.1–1.0)
Monocytes Relative: 1 %
NEUTROS PCT: 46 %
Neutro Abs: 0.5 10*3/uL — ABNORMAL LOW (ref 1.7–7.7)
PLATELETS: 47 10*3/uL — AB (ref 150–400)
RBC: 2.06 MIL/uL — AB (ref 4.22–5.81)
RDW: 21.9 % — ABNORMAL HIGH (ref 11.5–15.5)
WBC: 1 10*3/uL — CL (ref 4.0–10.5)

## 2015-07-01 LAB — MAGNESIUM: Magnesium: 1.5 mg/dL — ABNORMAL LOW (ref 1.7–2.4)

## 2015-07-01 LAB — PREPARE RBC (CROSSMATCH)

## 2015-07-01 LAB — PHOSPHORUS: PHOSPHORUS: 3.1 mg/dL (ref 2.5–4.6)

## 2015-07-01 LAB — TSH: TSH: 2.634 u[IU]/mL (ref 0.350–4.500)

## 2015-07-01 LAB — MRSA PCR SCREENING: MRSA BY PCR: NEGATIVE

## 2015-07-01 MED ORDER — ONDANSETRON HCL 4 MG PO TABS: 4.0000 mg | ORAL_TABLET | Freq: Four times a day (QID) | ORAL | Status: DC | PRN

## 2015-07-01 MED ORDER — LORATADINE 10 MG PO TABS
10.0000 mg | ORAL_TABLET | Freq: Every day | ORAL | Status: DC
  Administered 2015-07-01 – 2015-07-03 (×3): 10 mg via ORAL
  Filled 2015-07-01 (×3): qty 1

## 2015-07-01 MED ORDER — FUROSEMIDE 40 MG PO TABS
40.0000 mg | ORAL_TABLET | Freq: Every day | ORAL | Status: DC
  Administered 2015-07-01 – 2015-07-03 (×3): 40 mg via ORAL
  Filled 2015-07-01 (×3): qty 1

## 2015-07-01 MED ORDER — FLUTICASONE PROPIONATE 50 MCG/ACT NA SUSP
1.0000 | Freq: Every day | NASAL | Status: DC
  Administered 2015-07-01 – 2015-07-03 (×3): 1 via NASAL
  Filled 2015-07-01: qty 16

## 2015-07-01 MED ORDER — ALBUTEROL SULFATE (2.5 MG/3ML) 0.083% IN NEBU: 2.5000 mg | INHALATION_SOLUTION | RESPIRATORY_TRACT | Status: DC | PRN

## 2015-07-01 MED ORDER — INSULIN ASPART 100 UNIT/ML ~~LOC~~ SOLN: 0.0000 [IU] | Freq: Every day | SUBCUTANEOUS | Status: DC

## 2015-07-01 MED ORDER — MOMETASONE FURO-FORMOTEROL FUM 200-5 MCG/ACT IN AERO
2.0000 | INHALATION_SPRAY | Freq: Two times a day (BID) | RESPIRATORY_TRACT | Status: DC
  Administered 2015-07-01 – 2015-07-03 (×5): 2 via RESPIRATORY_TRACT
  Filled 2015-07-01: qty 8.8

## 2015-07-01 MED ORDER — HYDROCODONE-ACETAMINOPHEN 5-325 MG PO TABS: 1.0000 | ORAL_TABLET | ORAL | Status: DC | PRN

## 2015-07-01 MED ORDER — ALPRAZOLAM 0.25 MG PO TABS: 0.2500 mg | ORAL_TABLET | Freq: Every evening | ORAL | Status: DC | PRN

## 2015-07-01 MED ORDER — TRAMADOL HCL 50 MG PO TABS: 50.0000 mg | ORAL_TABLET | ORAL | Status: DC | PRN

## 2015-07-01 MED ORDER — POLYETHYLENE GLYCOL 3350 17 G PO PACK: 17.0000 g | PACK | Freq: Every day | ORAL | Status: DC | PRN

## 2015-07-01 MED ORDER — DOCUSATE SODIUM 100 MG PO CAPS: 100.0000 mg | ORAL_CAPSULE | Freq: Every day | ORAL | Status: DC | PRN

## 2015-07-01 MED ORDER — OXYBUTYNIN CHLORIDE ER 10 MG PO TB24
10.0000 mg | ORAL_TABLET | Freq: Every day | ORAL | Status: DC
  Administered 2015-07-01 – 2015-07-03 (×3): 10 mg via ORAL
  Filled 2015-07-01 (×3): qty 1

## 2015-07-01 MED ORDER — ASPIRIN 81 MG PO CHEW
81.0000 mg | CHEWABLE_TABLET | Freq: Every morning | ORAL | Status: DC
  Administered 2015-07-01 – 2015-07-03 (×3): 81 mg via ORAL
  Filled 2015-07-01 (×3): qty 1

## 2015-07-01 MED ORDER — IPRATROPIUM BROMIDE 0.02 % IN SOLN
0.5000 mg | Freq: Four times a day (QID) | RESPIRATORY_TRACT | Status: DC
  Administered 2015-07-01: 0.5 mg via RESPIRATORY_TRACT
  Filled 2015-07-01: qty 2.5

## 2015-07-01 MED ORDER — SODIUM CHLORIDE 0.9% FLUSH
3.0000 mL | Freq: Two times a day (BID) | INTRAVENOUS | Status: DC
  Administered 2015-07-01 – 2015-07-03 (×3): 3 mL via INTRAVENOUS

## 2015-07-01 MED ORDER — ACETAMINOPHEN 650 MG RE SUPP: 650.0000 mg | Freq: Four times a day (QID) | RECTAL | Status: DC | PRN

## 2015-07-01 MED ORDER — PANTOPRAZOLE SODIUM 40 MG PO TBEC
40.0000 mg | DELAYED_RELEASE_TABLET | Freq: Every day | ORAL | Status: DC
  Administered 2015-07-01 – 2015-07-03 (×3): 40 mg via ORAL
  Filled 2015-07-01 (×3): qty 1

## 2015-07-01 MED ORDER — DM-GUAIFENESIN ER 30-600 MG PO TB12: 1.0000 | ORAL_TABLET | Freq: Two times a day (BID) | ORAL | Status: DC | PRN

## 2015-07-01 MED ORDER — MAGNESIUM OXIDE 400 (241.3 MG) MG PO TABS
400.0000 mg | ORAL_TABLET | Freq: Two times a day (BID) | ORAL | Status: DC
  Administered 2015-07-01 – 2015-07-03 (×5): 400 mg via ORAL
  Filled 2015-07-01 (×5): qty 1

## 2015-07-01 MED ORDER — DOXYCYCLINE HYCLATE 100 MG PO TABS
100.0000 mg | ORAL_TABLET | Freq: Two times a day (BID) | ORAL | Status: DC
  Administered 2015-07-01 – 2015-07-03 (×6): 100 mg via ORAL
  Filled 2015-07-01 (×6): qty 1

## 2015-07-01 MED ORDER — POTASSIUM CHLORIDE CRYS ER 20 MEQ PO TBCR
20.0000 meq | EXTENDED_RELEASE_TABLET | Freq: Every day | ORAL | Status: DC
  Administered 2015-07-01 – 2015-07-03 (×3): 20 meq via ORAL
  Filled 2015-07-01 (×3): qty 1

## 2015-07-01 MED ORDER — ACETAMINOPHEN 325 MG PO TABS: 650.0000 mg | ORAL_TABLET | Freq: Four times a day (QID) | ORAL | Status: DC | PRN

## 2015-07-01 MED ORDER — SODIUM CHLORIDE 0.9% FLUSH
10.0000 mL | INTRAVENOUS | Status: DC | PRN
  Administered 2015-07-01 – 2015-07-03 (×3): 10 mL
  Filled 2015-07-01 (×3): qty 40

## 2015-07-01 MED ORDER — ONDANSETRON HCL 4 MG/2ML IJ SOLN: 4.0000 mg | Freq: Four times a day (QID) | INTRAMUSCULAR | Status: DC | PRN

## 2015-07-01 MED ORDER — INSULIN ASPART 100 UNIT/ML ~~LOC~~ SOLN
0.0000 [IU] | Freq: Three times a day (TID) | SUBCUTANEOUS | Status: DC
  Administered 2015-07-01: 2 [IU] via SUBCUTANEOUS
  Administered 2015-07-01: 1 [IU] via SUBCUTANEOUS
  Administered 2015-07-01: 2 [IU] via SUBCUTANEOUS
  Administered 2015-07-02: 1 [IU] via SUBCUTANEOUS
  Administered 2015-07-02 – 2015-07-03 (×4): 2 [IU] via SUBCUTANEOUS

## 2015-07-01 MED ORDER — SODIUM CHLORIDE 0.9 % IV SOLN
Freq: Once | INTRAVENOUS | Status: AC
  Administered 2015-07-01: 17:00:00 via INTRAVENOUS

## 2015-07-01 MED ORDER — FUROSEMIDE 10 MG/ML IJ SOLN
20.0000 mg | Freq: Once | INTRAMUSCULAR | Status: AC
  Administered 2015-07-01: 20 mg via INTRAVENOUS
  Filled 2015-07-01: qty 2

## 2015-07-01 MED ORDER — LEVOTHYROXINE SODIUM 25 MCG PO TABS
25.0000 ug | ORAL_TABLET | Freq: Every day | ORAL | Status: DC
  Administered 2015-07-01 – 2015-07-03 (×3): 25 ug via ORAL
  Filled 2015-07-01 (×3): qty 1

## 2015-07-01 MED ORDER — FAMOTIDINE 20 MG PO TABS
20.0000 mg | ORAL_TABLET | Freq: Every day | ORAL | Status: DC
  Administered 2015-07-01 – 2015-07-02 (×3): 20 mg via ORAL
  Filled 2015-07-01 (×3): qty 1

## 2015-07-01 MED ORDER — IPRATROPIUM BROMIDE 0.02 % IN SOLN
0.5000 mg | Freq: Two times a day (BID) | RESPIRATORY_TRACT | Status: DC
  Administered 2015-07-01 – 2015-07-02 (×3): 0.5 mg via RESPIRATORY_TRACT
  Filled 2015-07-01 (×3): qty 2.5

## 2015-07-01 MED ORDER — TRAZODONE HCL 50 MG PO TABS
150.0000 mg | ORAL_TABLET | Freq: Every day | ORAL | Status: DC
  Administered 2015-07-01 – 2015-07-02 (×3): 150 mg via ORAL
  Filled 2015-07-01 (×4): qty 3

## 2015-07-01 NOTE — Progress Notes (Signed)
CRITICAL VALUE ALERT  Critical value received:  WBC   Date of notification:  07/01/2015  Time of notification:  0715  Critical value read back:Yes.    Nurse who received alert:  Jefferey Pica- called me with results  MD notified (1st page):  buriev  Time of first page:  [phone]  MD notified (2nd page):  Time of second page:  Responding MD:    Time MD responded:

## 2015-07-01 NOTE — Progress Notes (Signed)
[facility] PROGRESS NOTE  [name] [id number] DOB: 07/02/1932 DOA: 06/30/2015 PCP: Christinia Gully, MD  Assessment/Plan: 82 y/o male with PMH of DM, Hypothyroidism, dCHF,  AV block s/p PPM, Lymphoma, MDS, (on chronic intermittent TFs), Cytopenia, presented with fatigue, generalized weakness, low grade fever  MDS. Chronic cytopenia. Anemia on chronic intermittent TFs. Hg is 7.2 today, also symptomatic anemia.  -d/w patient, will TF 2 units PRBCs (lasix in between). Recheck labs AM  Low grade fever at home. CXR: no clear infiltrates, UA: unremarkable. Currently afebrile. Started on doxy on admission for probable bronchitis. Pend cultures  DM. Monitor on ISS dCHF,  AV block s/p PPM. Few crackles lower lungs on exam. Resume lasix today. Lactic acidosis ->resolved. Replace electrolytes    Code Status: DNR Family Communication: d/w patient, his daughter  (indicate person spoken with, relationship, and if by phone, the number) Disposition Plan: home in 24-48 hrs, obtain PT   Consultants:  none  Procedures:  none  Antibiotics:  Doxycycline 6/4>>> (indicate start date, and stop date if known)  HPI/Subjective: Alert, no distress   Objective: Filed Vitals:   07/01/15 0047 07/01/15 0501  BP: 145/90 118/72  Pulse: 95 85  Temp: 98.1 F (36.7 C) 99.1 F (37.3 C)  Resp: 22 22    Intake/Output Summary (Last 24 hours) at 07/01/15 0914 Last data filed at 07/01/15 0700  Gross per 24 hour  Intake  697.5 ml  Output    375 ml  Net  322.5 ml   Filed Weights   06/30/15 1934 07/01/15 0047  Weight: 111.131 kg (245 lb) 111.403 kg (245 lb 9.6 oz)    Exam:   General:  Alert, comfortable   Cardiovascular: s1,s2 rrr  Respiratory: few crackles LL  Abdomen: soft, obese, nt   Musculoskeletal: mild pedal edema    Data Reviewed: Basic Metabolic Panel:  Recent Labs Lab 06/30/15 1956 07/01/15 0630  NA 134* 137  K 3.8 3.7  CL 100* 103  CO2 25 28  GLUCOSE 231*  134*  BUN 20 15  CREATININE 0.95 0.66  CALCIUM 9.2 8.5*  MG  --  1.5*  PHOS  --  3.1   Liver Function Tests:  Recent Labs Lab 06/30/15 1956 07/01/15 0630  AST 19 15  ALT 24 20  ALKPHOS 29* 24*  BILITOT 0.5 0.5  PROT 6.9 5.9*  ALBUMIN 3.9 3.3*   No results for input(s): LIPASE, AMYLASE in the last 168 hours. No results for input(s): AMMONIA in the last 168 hours. CBC:  Recent Labs Lab 06/30/15 2003 07/01/15 0630  WBC 1.7* 1.0*  NEUTROABS 1.0* 0.5*  HGB 8.2* 7.2*  HCT 23.4* 20.3*  MCV 98.3 98.5  PLT 64* 47*   Cardiac Enzymes:  Recent Labs Lab 06/30/15 1956 07/01/15 0109 07/01/15 0630  TROPONINI <0.03 <0.03 <0.03   BNP (last 3 results)  Recent Labs  01/18/15 0940  BNP 668.7*    ProBNP (last 3 results)  Recent Labs  05/13/15 1043  PROBNP 271.0*    CBG:  Recent Labs Lab 07/01/15 0135 07/01/15 0732  GLUCAP 152* 133*    Recent Results (from the past 240 hour(s))  Culture, blood (Routine X 2)     Status: None (Preliminary result)   Collection Time: 06/30/15  7:56 PM  Result Value Ref Range Status   Specimen Description BLOOD LEFT HAND  Final   Special Requests BOTTLES DRAWN AEROBIC AND ANAEROBIC 5CC  Final   Culture PENDING  Incomplete   Report Status  PENDING  Incomplete  Culture, blood (Routine X 2)     Status: None (Preliminary result)   Collection Time: 06/30/15  7:56 PM  Result Value Ref Range Status   Specimen Description BLOOD RIGHT CHEST  Final   Special Requests BOTTLES DRAWN AEROBIC AND ANAEROBIC 5CC  Final   Culture PENDING  Incomplete   Report Status PENDING  Incomplete  MRSA PCR Screening     Status: None   Collection Time: 07/01/15  1:18 AM  Result Value Ref Range Status   MRSA by PCR NEGATIVE NEGATIVE Final    Comment:        The GeneXpert MRSA Assay (FDA approved for NASAL specimens only), is one component of a comprehensive MRSA colonization surveillance program. It is not intended to diagnose MRSA infection nor to  guide or monitor treatment for MRSA infections.      Studies: Dg Chest 2 View  06/30/2015  CLINICAL DATA:  Recent dry cough with shortness of breath for a few days. History of diabetes and hypertension. EXAM: CHEST  2 VIEW COMPARISON:  05/13/2015 and 02/12/2015 radiographs. FINDINGS: The heart size and mediastinal contours are stable status post CABG. Right subclavian pacemaker leads are unchanged within the right atrium and right ventricle. The right IJ Port-A-Cath tip is unchanged near the SVC right atrial junction. There is stable mild atelectasis at both lung bases. No confluent airspace opacity, edema or significant pleural effusion. The bones appear unchanged. IMPRESSION: No acute cardiopulmonary process or significant changes. Stable cardiomegaly. Electronically Signed   By: Richardean Sale M.D.   On: 06/30/2015 19:57    Scheduled Meds: . aspirin  81 mg Oral q morning - 10a  . doxycycline  100 mg Oral Q12H  . famotidine  20 mg Oral QHS  . fluticasone  1 spray Each Nare Daily  . insulin aspart  0-5 Units Subcutaneous QHS  . insulin aspart  0-9 Units Subcutaneous TID WC  . ipratropium  0.5 mg Nebulization BID  . levothyroxine  25 mcg Oral QAC breakfast  . loratadine  10 mg Oral Daily  . mometasone-formoterol  2 puff Inhalation Q12H  . oxybutynin  10 mg Oral Daily  . pantoprazole  40 mg Oral Daily  . sodium chloride flush  3 mL Intravenous Q12H  . traZODone  150 mg Oral QHS   Continuous Infusions:   Active Problems:   Lactic acidosis   Pancytopenia (HCC)   MDS (myelodysplastic syndrome) (HCC)   Anemia of chronic disease   Pacemaker   Chronic diastolic CHF (congestive heart failure) (HCC)   Bronchitis, chronic obstructive w acute bronchitis (HCC)   Chest pain   Bronchitis    Time spent: >45 minutes     Kinnie Feil  [facility] Pager [phone]. If 7PM-7AM, please contact night-coverage at [email or web address], password [facility] 07/01/2015, 9:14 AM

## 2015-07-01 NOTE — Evaluation (Signed)
Physical Therapy Evaluation Patient Details Name: [name] MRN: [id number] DOB: 18-Aug-1932 Today's Date: 07/01/2015   History of Present Illness  82 year old male with PMHx of DM, Hypothyroidism, dCHF, AV block s/p PPM, Lymphoma, MDS, (on chronic intermittent TFs), Cytopenia and admitted for lactic acidosis and pancytopenia  Clinical Impression  Pt admitted with above diagnosis. Pt currently with functional limitations due to the deficits listed below (see PT Problem List). Pt will benefit from skilled PT to increase their independence and safety with mobility to allow discharge to the venue listed below.   Pt reports his daughter checks in and brings/makes meals for breakfast and dinner.  Pt states he has been ambulatory with SPC however not tolerating long distance well for a couple weeks.  Recommended pt use RW upon d/c until feeling less dizzy and weakness improves.     Follow Up Recommendations Home health PT (has had this in the past)    Equipment Recommendations  None recommended by PT    Recommendations for Other Services       Precautions / Restrictions Precautions Precautions: Fall      Mobility  Bed Mobility Overal bed mobility: Modified Independent                Transfers Overall transfer level: Needs assistance Equipment used: Rolling walker (2 wheeled) Transfers: Sit to/from Stand Sit to Stand: Min guard         General transfer comment: min/guard for safety  Ambulation/Gait Ambulation/Gait assistance: Min guard Ambulation Distance (Feet): 10 Feet Assistive device: Rolling walker (2 wheeled) Gait Pattern/deviations: Step-through pattern;Decreased stride length     General Gait Details: pt ambulated in room around his bed, reports slight dizziness improved with sitting in recliner, states he has only been tolerating short distances for past 1-2 weeks  Stairs            Wheelchair Mobility    Modified Rankin (Stroke Patients  Only)       Balance Overall balance assessment: Needs assistance         Standing balance support: Bilateral upper extremity supported Standing balance-Leahy Scale: Poor Standing balance comment: requiring UE support                             Pertinent Vitals/Pain Pain Assessment: No/denies pain  Pt receiving PRBCs during session Vitals end of session : 106/51 mmHg, 96% SpO2 room air, 87 bpm    Home Living Family/patient expects to be discharged to:: Private residence Living Arrangements: Alone Available Help at Discharge: Family;Available PRN/intermittently (daughter checks in for breakfast and dinner) Type of Home: House Home Access: Level entry     Home Layout: One level Home Equipment: Walker - 2 wheels;Cane - single point;Grab bars - toilet      Prior Function Level of Independence: Independent with assistive device(s)               Hand Dominance        Extremity/Trunk Assessment               Lower Extremity Assessment: Generalized weakness         Communication   Communication: HOH  Cognition Arousal/Alertness: Awake/alert Behavior During Therapy: WFL for tasks assessed/performed Overall Cognitive Status: Within Functional Limits for tasks assessed                      General Comments  Exercises        Assessment/Plan    PT Assessment Patient needs continued PT services  PT Diagnosis Difficulty walking;Generalized weakness   PT Problem List Decreased strength;Decreased mobility;Decreased activity tolerance  PT Treatment Interventions DME instruction;Gait training;Functional mobility training;Patient/family education;Therapeutic activities;Therapeutic exercise   PT Goals (Current goals can be found in the Care Plan section) Acute Rehab PT Goals PT Goal Formulation: With patient Time For Goal Achievement: 07/08/15 Potential to Achieve Goals: Good    Frequency Min 3X/week   Barriers to discharge         Co-evaluation               End of Session   Activity Tolerance: Patient limited by fatigue Patient left: in chair;with call bell/phone within reach;with chair alarm set      Functional Assessment Tool Used: clinical judgement Functional Limitation: Mobility: Walking and moving around Mobility: Walking and Moving Around Current Status [id number]): At least 1 percent but less than 20 percent impaired, limited or restricted Mobility: Walking and Moving Around Goal Status [phone]): At least 1 percent but less than 20 percent impaired, limited or restricted    Time: 1400-1417 PT Time Calculation (min) (ACUTE ONLY): 17 min   Charges:   PT Evaluation $PT Eval Low Complexity: 1 Procedure     PT G Codes:   PT G-Codes **NOT FOR INPATIENT CLASS** Functional Assessment Tool Used: clinical judgement Functional Limitation: Mobility: Walking and moving around Mobility: Walking and Moving Around Current Status [id number]): At least 1 percent but less than 20 percent impaired, limited or restricted Mobility: Walking and Moving Around Goal Status [phone]): At least 1 percent but less than 20 percent impaired, limited or restricted    Jerriyah Louis,KATHrine E 07/01/2015, 3:36 PM Carmelia Bake, PT, DPT 07/01/2015 Pager: [phone]

## 2015-07-02 DIAGNOSIS — D709 Neutropenia, unspecified: Secondary | ICD-10-CM | POA: Diagnosis not present

## 2015-07-02 DIAGNOSIS — J40 Bronchitis, not specified as acute or chronic: Secondary | ICD-10-CM

## 2015-07-02 DIAGNOSIS — R5081 Fever presenting with conditions classified elsewhere: Secondary | ICD-10-CM | POA: Diagnosis not present

## 2015-07-02 DIAGNOSIS — E872 Acidosis: Secondary | ICD-10-CM | POA: Diagnosis not present

## 2015-07-02 DIAGNOSIS — D638 Anemia in other chronic diseases classified elsewhere: Secondary | ICD-10-CM

## 2015-07-02 LAB — BASIC METABOLIC PANEL
ANION GAP: 7 (ref 5–15)
BUN: 20 mg/dL (ref 6–20)
CO2: 29 mmol/L (ref 22–32)
Calcium: 8.6 mg/dL — ABNORMAL LOW (ref 8.9–10.3)
Chloride: 99 mmol/L — ABNORMAL LOW (ref 101–111)
Creatinine, Ser: 0.74 mg/dL (ref 0.61–1.24)
GFR calc Af Amer: >60 mL/min (ref >60)
GFR calc non Af Amer: >60 mL/min (ref >60)
GLUCOSE: 143 mg/dL — AB (ref 65–99)
POTASSIUM: 3.6 mmol/L (ref 3.5–5.1)
Sodium: 135 mmol/L (ref 135–145)

## 2015-07-02 LAB — CBC
HCT: 25.3 % — ABNORMAL LOW (ref 39.0–52.0)
HEMOGLOBIN: 8.8 g/dL — AB (ref 13.0–17.0)
MCH: 33.8 pg (ref 26.0–34.0)
MCHC: 34.8 g/dL (ref 30.0–36.0)
MCV: 97.3 fL (ref 78.0–100.0)
Platelets: 47 10*3/uL — ABNORMAL LOW (ref 150–400)
RBC: 2.6 MIL/uL — ABNORMAL LOW (ref 4.22–5.81)
RDW: 22.7 % — ABNORMAL HIGH (ref 11.5–15.5)
WBC: 1.1 10*3/uL — CL (ref 4.0–10.5)

## 2015-07-02 LAB — TYPE AND SCREEN
ABO/RH(D): A POS
ANTIBODY SCREEN: NEGATIVE
Unit division: 0
Unit division: 0

## 2015-07-02 LAB — GLUCOSE, CAPILLARY
GLUCOSE-CAPILLARY: 140 mg/dL — AB (ref 65–99)
GLUCOSE-CAPILLARY: 165 mg/dL — AB (ref 65–99)
Glucose-Capillary: 159 mg/dL — ABNORMAL HIGH (ref 65–99)
Glucose-Capillary: 151 mg/dL — ABNORMAL HIGH (ref 65–99)

## 2015-07-02 LAB — HEMOGLOBIN A1C
HEMOGLOBIN A1C: 6.9 % — AB (ref 4.8–5.6)
MEAN PLASMA GLUCOSE: 151 mg/dL

## 2015-07-02 LAB — MAGNESIUM: MAGNESIUM: 1.5 mg/dL — AB (ref 1.7–2.4)

## 2015-07-02 MED ORDER — IPRATROPIUM BROMIDE 0.02 % IN SOLN: 0.5000 mg | RESPIRATORY_TRACT | Status: DC | PRN

## 2015-07-02 NOTE — Progress Notes (Signed)
Physical Therapy Treatment Patient Details Name: [name] MRN: [id number] DOB: Jul 25, 1932 Today's Date: 12-Jul-2015    History of Present Illness 81 year old male with PMHx of DM, Hypothyroidism, dCHF, AV block s/p PPM, Lymphoma, MDS, (on chronic intermittent TFs), Cytopenia and admitted for lactic acidosis and pancytopenia    PT Comments    Pt mobility improved today, and pt tolerated well.  Follow Up Recommendations  Home health PT     Equipment Recommendations  None recommended by PT    Recommendations for Other Services       Precautions / Restrictions Precautions Precautions: Fall    Mobility  Bed Mobility               General bed mobility comments: pt OOB in recliner  Transfers Overall transfer level: Needs assistance Equipment used: Rolling walker (2 wheeled) Transfers: Sit to/from Stand Sit to Stand: Supervision            Ambulation/Gait Ambulation/Gait assistance: Supervision;Min guard Ambulation Distance (Feet): 120 Feet (x2) Assistive device: Rolling walker (2 wheeled);Straight cane Gait Pattern/deviations: Step-through pattern     General Gait Details: pt ambulated with SPC 120 feet then had seated rest break, pt with good self awareness and requested RW to continue and performed another 120 feet, denies dizziness today   Stairs            Wheelchair Mobility    Modified Rankin (Stroke Patients Only)       Balance                                    Cognition                            Exercises      General Comments        Pertinent Vitals/Pain Pain Assessment: No/denies pain    Home Living                      Prior Function            PT Goals (current goals can now be found in the care plan section) Progress towards PT goals: Progressing toward goals    Frequency  Min 3X/week    PT Plan Current plan remains appropriate    Co-evaluation              End of Session   Activity Tolerance: Patient tolerated treatment well Patient left: in chair;with call bell/phone within reach;with chair alarm set     Time: [id number] PT Time Calculation (min) (ACUTE ONLY): 13 min  Charges:  $Gait Training: 8-22 mins                    G Codes:      [name],[name] July 12, 2015, 1:19 PM Carmelia Bake, PT, DPT 07/12/15 Pager: [phone]

## 2015-07-02 NOTE — Progress Notes (Signed)
[facility] PROGRESS NOTE  [name] [id number] DOB: 06/25/1932 DOA: 06/30/2015 PCP: Christinia Gully, MD  Assessment/Plan: 82 y/o male with PMH of DM, Hypothyroidism, dCHF,  AV block s/p PPM, Lymphoma, MDS, (on chronic intermittent TFs), Cytopenia, presented with fatigue, generalized weakness, low grade fever  MDS. Chronic cytopenia. Anemia on chronic intermittent TFs. Hg is 8.8g/dl today. Not keen on going home today. Patient is status post blood transfusion. Follow cultures. Likely DC in 1-2 days. Low threshold to change antibiotics to IV.   Low grade fever at home. CXR: no clear infiltrates, UA: unremarkable. Currently afebrile. Started on doxy on admission for probable bronchitis. Follow pending cultures.  DM. Optimize.  dCHF,  AV block s/p PPM.    Code Status: DNR Family Communication: d/w patient, his daughter  (indicate person spoken with, relationship, and if by phone, the number) Disposition Plan: home in 24-48 hrs, obtain PT   Consultants:  none  Procedures:  none  Antibiotics:  Doxycycline 6/4>>> (indicate start date, and stop date if known)  HPI/Subjective: Alert, no distress   Objective: Filed Vitals:   07/02/15 0205 07/02/15 0633  BP: 107/56 112/71  Pulse: 85 75  Temp: 99 F (37.2 C) 97.5 F (36.4 C)  Resp: 18 18    Intake/Output Summary (Last 24 hours) at 07/02/15 1131 Last data filed at 07/02/15 0634  Gross per 24 hour  Intake   1347 ml  Output   1600 ml  Net   -253 ml   Filed Weights   06/30/15 1934 07/01/15 0047 07/02/15 1010  Weight: 111.131 kg (245 lb) 111.403 kg (245 lb 9.6 oz) 112.855 kg (248 lb 12.8 oz)    Exam:   General:  Alert, comfortable   Cardiovascular: s1,s2 rrr  Respiratory: Clear to ausculation  Abdomen: soft, obese, nt   Musculoskeletal: Very minimal pedal edema    Data Reviewed: Basic Metabolic Panel:  Recent Labs Lab 06/30/15 1956 07/01/15 0630 07/02/15 0510  NA 134* 137 135  K 3.8 3.7 3.6   CL 100* 103 99*  CO2 25 28 29   GLUCOSE 231* 134* 143*  BUN 20 15 20   CREATININE 0.95 0.66 0.74  CALCIUM 9.2 8.5* 8.6*  MG  --  1.5* 1.5*  PHOS  --  3.1  --    Liver Function Tests:  Recent Labs Lab 06/30/15 1956 07/01/15 0630  AST 19 15  ALT 24 20  ALKPHOS 29* 24*  BILITOT 0.5 0.5  PROT 6.9 5.9*  ALBUMIN 3.9 3.3*   No results for input(s): LIPASE, AMYLASE in the last 168 hours. No results for input(s): AMMONIA in the last 168 hours. CBC:  Recent Labs Lab 06/30/15 2003 07/01/15 0630 07/02/15 0510  WBC 1.7* 1.0* 1.1*  NEUTROABS 1.0* 0.5*  --   HGB 8.2* 7.2* 8.8*  HCT 23.4* 20.3* 25.3*  MCV 98.3 98.5 97.3  PLT 64* 47* 47*   Cardiac Enzymes:  Recent Labs Lab 06/30/15 1956 07/01/15 0109 07/01/15 0630 07/01/15 1241  TROPONINI <0.03 <0.03 <0.03 <0.03   BNP (last 3 results)  Recent Labs  01/18/15 0940  BNP 668.7*    ProBNP (last 3 results)  Recent Labs  05/13/15 1043  PROBNP 271.0*    CBG:  Recent Labs Lab 07/01/15 0732 07/01/15 1047 07/01/15 1743 07/01/15 2142 07/02/15 0741  GLUCAP 133* 176* 155* 160* 159*    Recent Results (from the past 240 hour(s))  Culture, blood (Routine X 2)     Status: None (Preliminary result)   Collection  Time: 06/30/15  7:56 PM  Result Value Ref Range Status   Specimen Description BLOOD LEFT HAND  Final   Special Requests BOTTLES DRAWN AEROBIC AND ANAEROBIC 5CC  Final   Culture   Final    NO GROWTH < 12 HOURS Performed at [facility]    Report Status PENDING  Incomplete  Culture, blood (Routine X 2)     Status: None (Preliminary result)   Collection Time: 06/30/15  7:56 PM  Result Value Ref Range Status   Specimen Description BLOOD RIGHT CHEST  Final   Special Requests BOTTLES DRAWN AEROBIC AND ANAEROBIC 5CC  Final   Culture   Final    NO GROWTH < 12 HOURS Performed at [facility]    Report Status PENDING  Incomplete  Urine culture     Status: None (Preliminary result)   Collection  Time: 06/30/15  8:18 PM  Result Value Ref Range Status   Specimen Description URINE, CLEAN CATCH  Final   Special Requests NONE  Final   Culture   Final    CULTURE REINCUBATED FOR BETTER GROWTH Performed at [facility]    Report Status PENDING  Incomplete  MRSA PCR Screening     Status: None   Collection Time: 07/01/15  1:18 AM  Result Value Ref Range Status   MRSA by PCR NEGATIVE NEGATIVE Final    Comment:        The GeneXpert MRSA Assay (FDA approved for NASAL specimens only), is one component of a comprehensive MRSA colonization surveillance program. It is not intended to diagnose MRSA infection nor to guide or monitor treatment for MRSA infections.      Studies: Dg Chest 2 View  06/30/2015  CLINICAL DATA:  Recent dry cough with shortness of breath for a few days. History of diabetes and hypertension. EXAM: CHEST  2 VIEW COMPARISON:  05/13/2015 and 02/12/2015 radiographs. FINDINGS: The heart size and mediastinal contours are stable status post CABG. Right subclavian pacemaker leads are unchanged within the right atrium and right ventricle. The right IJ Port-A-Cath tip is unchanged near the SVC right atrial junction. There is stable mild atelectasis at both lung bases. No confluent airspace opacity, edema or significant pleural effusion. The bones appear unchanged. IMPRESSION: No acute cardiopulmonary process or significant changes. Stable cardiomegaly. Electronically Signed   By: Richardean Sale M.D.   On: 06/30/2015 19:57    Scheduled Meds: . aspirin  81 mg Oral q morning - 10a  . doxycycline  100 mg Oral Q12H  . famotidine  20 mg Oral QHS  . fluticasone  1 spray Each Nare Daily  . furosemide  40 mg Oral Daily  . insulin aspart  0-5 Units Subcutaneous QHS  . insulin aspart  0-9 Units Subcutaneous TID WC  . ipratropium  0.5 mg Nebulization BID  . levothyroxine  25 mcg Oral QAC breakfast  . loratadine  10 mg Oral Daily  . magnesium oxide  400 mg Oral BID  .  mometasone-formoterol  2 puff Inhalation Q12H  . oxybutynin  10 mg Oral Daily  . pantoprazole  40 mg Oral Daily  . potassium chloride  20 mEq Oral Daily  . sodium chloride flush  3 mL Intravenous Q12H  . traZODone  150 mg Oral QHS   Continuous Infusions:   Active Problems:   Lactic acidosis   Pancytopenia (HCC)   MDS (myelodysplastic syndrome) (HCC)   Anemia of chronic disease   Pacemaker   Chronic diastolic CHF (  congestive heart failure) (HCC)   Bronchitis, chronic obstructive w acute bronchitis (HCC)   Chest pain   Bronchitis    Time spent: >35 minutes     [place] [facility]  [facility] Pager [phone]. If 7PM-7AM, please contact night-coverage at [email or web address], password [facility] 07/02/2015, 11:31 AM

## 2015-07-03 ENCOUNTER — Other Ambulatory Visit: Payer: Medicare Other

## 2015-07-03 ENCOUNTER — Telehealth: Payer: Self-pay | Admitting: *Deleted

## 2015-07-03 ENCOUNTER — Ambulatory Visit: Payer: Medicare Other

## 2015-07-03 DIAGNOSIS — D469 Myelodysplastic syndrome, unspecified: Secondary | ICD-10-CM | POA: Diagnosis not present

## 2015-07-03 DIAGNOSIS — E872 Acidosis: Secondary | ICD-10-CM | POA: Diagnosis not present

## 2015-07-03 DIAGNOSIS — J40 Bronchitis, not specified as acute or chronic: Secondary | ICD-10-CM | POA: Diagnosis not present

## 2015-07-03 DIAGNOSIS — D638 Anemia in other chronic diseases classified elsewhere: Secondary | ICD-10-CM | POA: Diagnosis not present

## 2015-07-03 LAB — GLUCOSE, CAPILLARY
GLUCOSE-CAPILLARY: 163 mg/dL — AB (ref 65–99)
Glucose-Capillary: 200 mg/dL — ABNORMAL HIGH (ref 65–99)

## 2015-07-03 MED ORDER — TRAZODONE HCL 150 MG PO TABS: 150.0000 mg | ORAL_TABLET | Freq: Every day | ORAL | Status: AC

## 2015-07-03 MED ORDER — MAGNESIUM OXIDE 400 (241.3 MG) MG PO TABS: 400.0000 mg | ORAL_TABLET | Freq: Two times a day (BID) | ORAL | Status: DC

## 2015-07-03 MED ORDER — DOXYCYCLINE HYCLATE 100 MG PO TABS: 100.0000 mg | ORAL_TABLET | Freq: Two times a day (BID) | ORAL | Status: DC

## 2015-07-03 MED ORDER — HEPARIN SOD (PORK) LOCK FLUSH 100 UNIT/ML IV SOLN
500.0000 [IU] | Freq: Once | INTRAVENOUS | Status: DC
  Filled 2015-07-03 (×2): qty 5

## 2015-07-03 NOTE — Telephone Encounter (Signed)
Daughter stephanie calling. Should they keep regularly scheduled apt 07/17/15? Note to dr [name].

## 2015-07-03 NOTE — Care Management Obs Status (Signed)
[place] NOTIFICATION   Patient Details  Name: [name] MRN: [id number] Date of Birth: 1932-12-26   Medicare Observation Status Notification Given:  Yes, not comfortable signing.    Purcell Mouton, RN 07/03/2015, 9:14 AM

## 2015-07-03 NOTE — Progress Notes (Signed)
Discharge instructions given to daughter, verbalized understanding.

## 2015-07-03 NOTE — Telephone Encounter (Signed)
Per dr [name], okay to keep regularly scheduled appt 07/17/15. Daughter stephanie verbalized understanding.

## 2015-07-03 NOTE — Discharge Summary (Signed)
Physician Discharge Summary  Patient ID: [name] MRN: [id number] DOB/AGE: 80/18/1934 45 y.o.  Admit date: 06/30/2015 Discharge date: 07/03/2015  Admission Diagnoses:  Discharge Diagnoses:  Active Problems:   Lactic acidosis   Pancytopenia (HCC)   MDS (myelodysplastic syndrome) (HCC)   Anemia of chronic disease   Pacemaker   Chronic diastolic CHF (congestive heart failure) (HCC)   Bronchitis, chronic obstructive w acute bronchitis (HCC)   Chest pain   Bronchitis   Discharged Condition: stable  Hospital Course: 82 year old male with medical history significant of lymphoma and myelodysplastic disorder Chronic lactic acidosis, pancytopenia, anemia requiring transfusion support, chronic diastolic heart failure, status post pacemaker due to Mobitz II AV block syncope, diabetes mellitus hemoglobin A1c 5.5 2012. Patient gets transfused with PRBC every two weeks. Patient presented with 3 day history of intermittent low-grade fevers up to 100.3, dry cough, overall body aches and weakness. Patient seemed volume depleted on admission and orthostatic. Patient was also pale, with symptoms of significant anemia. No sick contacts. Patient was admitted for further assessment and management. Patient was treated with Doxycyline for possible bronchitis. Patient was transfused with 2 units of PRBC. Patient feels he is back to his baseline and is eager to be discharged back home today.  Consults: None  Significant Diagnostic Studies:  Discharge Medication - See Med Rec.  Discharge Exam: Blood pressure 126/81, pulse 85, temperature 98.8 F (37.1 C), temperature source Oral, resp. rate 20, height 5\' 11"  (1.803 m), weight 111.2 kg (245 lb 2.4 oz), SpO2 99 %.  Disposition: 01-Home or Self Care  Discharge Instructions    Diet - low sodium heart healthy    Complete by:  As directed      Diet Carb Modified    Complete by:  As directed      Discharge instructions    Complete by:  As directed   Follow up with Oncology. Call MD with worsening symptoms.     Increase activity slowly    Complete by:  As directed             Medication List    STOP taking these medications        chlorpheniramine 4 MG tablet  Commonly known as:  CHLOR-TRIMETON     dextromethorphan-guaiFENesin 30-600 MG 12hr tablet  Commonly known as:  MUCINEX DM     guaiFENesin-dextromethorphan 100-10 MG/5ML syrup  Commonly known as:  ROBITUSSIN DM     hydrOXYzine 25 MG tablet  Commonly known as:  ATARAX/VISTARIL     KLOR-CON M20 20 MEQ tablet  Generic drug:  potassium chloride SA     sulindac 200 MG tablet  Commonly known as:  CLINORIL     traMADol 50 MG tablet  Commonly known as:  ULTRAM      TAKE these medications        acetaminophen 500 MG tablet  Commonly known as:  TYLENOL  Take 500-1,000 mg by mouth every 6 (six) hours as needed for mild pain or headache. Reported on 03/05/2015     albuterol 108 (90 Base) MCG/ACT inhaler  Commonly known as:  PROVENTIL HFA;VENTOLIN HFA  Inhale 1-2 puffs into the lungs every 4 (four) hours as needed for wheezing or shortness of breath.     albuterol (2.5 MG/3ML) 0.083% nebulizer solution  Commonly known as:  PROVENTIL  Take 3 mLs (2.5 mg total) by nebulization every 4 (four) hours as needed for wheezing or shortness of breath.     ALPRAZolam 0.25  MG tablet  Commonly known as:  XANAX  Take 1 tablet (0.25 mg total) by mouth at bedtime as needed for anxiety.     aspirin 81 MG tablet  Take 81 mg by mouth every morning. (hold if bleeding)     cetirizine 10 MG tablet  Commonly known as:  ZYRTEC  Take 10 mg by mouth daily as needed for allergies.     docusate sodium 100 MG capsule  Commonly known as:  COLACE  Take 100 mg by mouth daily as needed for moderate constipation.     doxycycline 100 MG tablet  Commonly known as:  VIBRA-TABS  Take 1 tablet (100 mg total) by mouth every 12 (twelve) hours.     famotidine 20 MG tablet  Commonly known as:   PEPCID  Take 20 mg by mouth at bedtime.     finasteride 5 MG tablet  Commonly known as:  PROSCAR  TAKE 1 TABLET BY MOUTH EVERY MORNING     fluticasone 50 MCG/ACT nasal spray  Commonly known as:  FLONASE  1-2 puffs each nostril once or twice daily for allergy     furosemide 40 MG tablet  Commonly known as:  LASIX  TAKE 1 TABLET BY MOUTH EVERY DAY MAY TAKE 1 EXTRA TABLET DAILY IF NEEDED FOR SWELLING IN LEGS     levothyroxine 25 MCG tablet  Commonly known as:  SYNTHROID, LEVOTHROID  TAKE 1 TABLET BY MOUTH EVERY MORNING BEFORE BREAKFAST     lidocaine-prilocaine cream  Commonly known as:  EMLA  Apply 1 application topically as needed.     magnesium oxide 400 (241.3 Mg) MG tablet  Commonly known as:  MAG-OX  Take 1 tablet (400 mg total) by mouth 2 (two) times daily.     metFORMIN 500 MG tablet  Commonly known as:  GLUCOPHAGE  TAKE 1 TABLET BY MOUTH TWICE A DAY WITH FOOD     mometasone-formoterol 200-5 MCG/ACT Aero  Commonly known as:  DULERA  Take 2 puffs first thing in am and then another 2 puffs about 12 hours later.     multivitamin tablet  Take 1 tablet by mouth every morning.     nitroGLYCERIN 0.4 MG SL tablet  Commonly known as:  NITROSTAT  Place 1 tablet (0.4 mg total) under the tongue every 5 (five) minutes as needed for chest pain.     omeprazole 20 MG capsule  Commonly known as:  PRILOSEC  Take 40 mg by mouth daily before breakfast.     OS-CAL 500 + D 500-200 MG-UNIT tablet  Generic drug:  calcium-vitamin D  Take 1 tablet by mouth 2 (two) times daily.     oxybutynin 10 MG 24 hr tablet  Commonly known as:  DITROPAN-XL  Take 10 mg by mouth every morning. Reported on 05/13/2015     OXYGEN  2 lpm with sleep     polyethylene glycol packet  Commonly known as:  MIRALAX / GLYCOLAX  Take 17 g by mouth daily as needed for moderate constipation or severe constipation.     traZODone 150 MG tablet  Commonly known as:  DESYREL  Take 1 tablet (150 mg total) by mouth  at bedtime.     Vitamin D 1000 units capsule  Take 1,000 Units by mouth 2 (two) times daily.         SignedBonnell Public 07/03/2015, 12:12 PM

## 2015-07-03 NOTE — Care Management Obs Status (Signed)
Hand NOTIFICATION   Patient Details  Name: [name] MRN: [id number] Date of Birth: [birth year]   Medicare Observation Status Notification Given:  Yes and ABN was given.    Purcell Mouton, RN 07/03/2015, 9:14 AM

## 2015-07-04 LAB — URINE CULTURE: Culture: 40000 — AB

## 2015-07-06 LAB — CULTURE, BLOOD (ROUTINE X 2)
Culture: NO GROWTH
Culture: NO GROWTH

## 2015-07-08 ENCOUNTER — Encounter: Payer: Self-pay | Admitting: Adult Health

## 2015-07-08 ENCOUNTER — Ambulatory Visit (INDEPENDENT_AMBULATORY_CARE_PROVIDER_SITE_OTHER): Payer: Medicare Other | Admitting: Adult Health

## 2015-07-08 ENCOUNTER — Other Ambulatory Visit (INDEPENDENT_AMBULATORY_CARE_PROVIDER_SITE_OTHER): Payer: Medicare Other

## 2015-07-08 VITALS — BP 134/82 | HR 90 | Temp 98.1°F | Ht 71.0 in | Wt 248.0 lb

## 2015-07-08 DIAGNOSIS — J45991 Cough variant asthma: Secondary | ICD-10-CM | POA: Diagnosis not present

## 2015-07-08 DIAGNOSIS — I5032 Chronic diastolic (congestive) heart failure: Secondary | ICD-10-CM

## 2015-07-08 DIAGNOSIS — R0600 Dyspnea, unspecified: Secondary | ICD-10-CM | POA: Diagnosis not present

## 2015-07-08 DIAGNOSIS — D61818 Other pancytopenia: Secondary | ICD-10-CM | POA: Diagnosis not present

## 2015-07-08 LAB — CBC WITH DIFFERENTIAL/PLATELET
BASOS PCT: 0.2 % (ref 0.0–3.0)
Basophils Absolute: 0 10*3/uL (ref 0.0–0.1)
EOS ABS: 0 10*3/uL (ref 0.0–0.7)
EOS PCT: 0.5 % (ref 0.0–5.0)
HCT: 24.9 % — ABNORMAL LOW (ref 39.0–52.0)
LYMPHS ABS: 0.8 10*3/uL (ref 0.7–4.0)
Lymphocytes Relative: 59.4 % — ABNORMAL HIGH (ref 12.0–46.0)
MCHC: 34.6 g/dL (ref 30.0–36.0)
MCV: 98.9 fl (ref 78.0–100.0)
MONO ABS: 0 10*3/uL — AB (ref 0.1–1.0)
Monocytes Relative: 0.1 % — ABNORMAL LOW (ref 3.0–12.0)
NEUTROS ABS: 0.5 10*3/uL — AB (ref 1.4–7.7)
NEUTROS PCT: 39.8 % — AB (ref 43.0–77.0)
PLATELETS: 81 10*3/uL — AB (ref 150.0–400.0)
RBC: 2.52 Mil/uL — ABNORMAL LOW (ref 4.22–5.81)
RDW: 24.5 % — AB (ref 11.5–15.5)
WBC: 1.3 10*3/uL — CL (ref 4.0–10.5)

## 2015-07-08 LAB — BASIC METABOLIC PANEL
BUN: 19 mg/dL (ref 6–23)
CALCIUM: 10.1 mg/dL (ref 8.4–10.5)
CO2: 31 meq/L (ref 19–32)
CREATININE: 0.81 mg/dL (ref 0.40–1.50)
Chloride: 96 mEq/L (ref 96–112)
GFR: 97.24 mL/min (ref >60.00)
Glucose, Bld: 147 mg/dL — ABNORMAL HIGH (ref 70–99)
Potassium: 4.2 mEq/L (ref 3.5–5.1)
Sodium: 136 mEq/L (ref 135–145)

## 2015-07-08 LAB — BRAIN NATRIURETIC PEPTIDE: PRO B NATRI PEPTIDE: 447 pg/mL — AB (ref 0.0–100.0)

## 2015-07-08 NOTE — Assessment & Plan Note (Signed)
MDS with pancytopenia  Check cbc today , if hbg remains low will need to refer to hematology for probable transfusion.

## 2015-07-08 NOTE — Patient Instructions (Signed)
Stop Sulindac .  Labs today .  Follow with Dr. [name] as planned next week.  Follow med calendar closely and bring to each visit.  Follow up in 3 months with Dr. [name]  And As needed

## 2015-07-08 NOTE — Assessment & Plan Note (Signed)
Recent flare with bronciits  cxr w/ no acute process  No further abx at this time Hold on steorids .

## 2015-07-08 NOTE — Progress Notes (Signed)
Subjective:    Patient ID: [name], male    DOB: [birth year]  MRN: [id number]  History of Present Illness  82 year old white male, former smoker( Quit date 16), with mild obesity complicated by aodm, Lymphoma s/p chemo ([facility]) and tendency to peripheral edema that is felt to be dependent and related to obesity/ venous insuff.Here for Follow up hospital admission visit.   Admit date: 01/18/2015 Discharge date: 01/22/2015  Discharge Diagnoses:  Principal Problem:  Sepsis due to pneumonia [facility]) Active Problems:  Lobar pneumonia, unspecified organism ([place])  Lactic acidosis  Pancytopenia (HCC)  MDS (myelodysplastic syndrome) (HCC)  Neutropenia (HCC)  Thrombocytopenia (HCC)  Anemia of chronic disease  Pacemaker  Chronic diastolic CHF (congestive heart failure) ([place])     03/05/2015 Follow up : DCHF , Cough variant asthma , DM  Pt returns for a 3 week follow up .  Says overall he is doing better. Recovering from PNA.  Clinically is improving with improved energy.  Working with PT at home.  Now able to walk with cane.  Minimal cough and congestion . No fever or discolored mucus.  No hemoptysis .  No increased leg swelling or orthopnea  Needs updated med calendar list, reviewed with pt and daughter.  Would like a refill of xanax, uses it at night to help with sleep. On low dose  rec No changes  04/12/15 Dr Donalee Citrin Script sent for doxycycline antibiotic Ok to take a probiotic with the antibiotic Script sent changing Dulera to Symbicort maintenance inhaler Script sent changing nasonex to flonase nasal spray     05/13/2015 extended  f/u ov/Wert re: sob /cough ? Asthma related  Chief Complaint  Patient presents with  . Follow-up    Breathing is progressively worse. He states he gets winded with very minimal exertion. Cough has not improved since the last visit. He still has occ wheeze.   cough is sporadic more often sitting in chair / and w/in 10 min  at hs x months with minimal mucoid sputum produced and no am excess  Breathing worse p change to symbicort from dulera but turns out at baseline using the neb bid, not prn as per med calendar  Doe = MMRC3 = can't walk 100 yards even at a slow pace at a flat grade s stopping due to sob  (not reproduced on walk test this ov) Much better after tx of prbc's vs prior. Using saba multiple forms hfa and neb rec Walk slower if getting short of breath with activity  Change trazadone 150 one whole at bedtime Restart dulera 200 2 every 12 hours  For drainage / throat tickle try take CHLORPHENIRAMINE  4 mg - take one every 4 hours as needed   Work on inhaler technique:   Plan A = Automatic = dulera 200 Take 2 puffs first thing in am and then another 2 puffs about 12 hours later  Plan B = Backup Only use your albuterol (proair) as a rescue medication. - Ok to use the inhaler up to 2 puffs  every 4 hours if you must but call for appointment if use goes up over your usual need - Don't leave home without it !!  (think of it like the spare tire for your car)  Plan C = Crisis/ Contringency - only use your albuterol nebulizer if you first try Plan B  Depomedrol 120 today     06/10/2015  Extended  f/u ov/Wert re: doe/ restriction on pfts (p  am dulera 200) Chief Complaint  Patient presents with  . Follow-up    Breathing is unchanged. No new co's today.   thinks maybe a little better sob p depomedrol 120 last ov but biggest change was with tx prbcs - using saba in hfa form only (never neb) around noon and 6 pm though supposed to be using dulera 200 2 when wakes and 12 h later per med calendar, very easily confused with details of care / can't seem to remember whether he's bothered by cough or not - could not find chlorpheniramine  >>depo medrol shot   .07/08/2015 Follow up : Med calendar and Cough variant Asthma / MDS /D-CHF  Pt returns for 1 month follow up .  Recent admitted 06/30/15 for acute bronchitis, tx  w/ abx. He has MDS with pancytopenia requiring frequent blood  transfusions to keep Hbg >9.  He received 2 u PRBC during admission , .Hbg at discharge last week was 8.8.  Pt brought all his meds in today , we reviewed them and organized them into a med calendar with pt education.  His daughter helps him with his meds. Appears to be taking correctly.   He remains sob and weak since discharge. They are worried hbg is getting worse.  He has follow up with Hematology next week.  No known bleeding . He does take sulindac for joint pain, advised to stop this .   Noticing that chronic leg swelling is worse lately. Advised to use extra lasix as previously instructed.  Has dry cough and DOE -chronic but worse for last month.  Denies chest pain, fever or hemoptysis .      Current Medications, Allergies, Complete Past Medical History, Past Surgical History, Family History, and Social History were reviewed in [employer] record.  ROS  The following are not active complaints unless bolded sore throat, dysphagia, dental problems, itching, sneezing,  nasal congestion or excess/ purulent secretions, ear ache,   fever, chills, sweats, unintended wt loss, classically pleuritic or exertional cp, hemoptysis,  orthopnea pnd or leg swelling, presyncope, palpitations, abdominal pain, anorexia, nausea, vomiting, diarrhea  or change in bowel or bladder habits, change in stools or urine, dysuria,hematuria,  rash, arthralgias, visual complaints, headache, numbness, weakness or ataxia or problems with walking or coordination,  change in mood/affect or memory.                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 [facility]  - MRI 11/23/2007 c/w L1-2 bulging disc indents the thecal sac with foraminal stenosis  HEALTH MAINTENANCE..........................................................................................[facility]  - Td 10/2005 and 06/10/2015  - Pneumovax 10/07 and 10/2012 and Prevnar 06/14/13   -colon 02/2005 -int/extern. hems (repeat q7y)  Complex med regimen  --Meds reviewed with pt education and computerized med calendar completed/adjusted. November 08, 2008 ,, 07/08/2015  August 21, 2009 updated 02/19/2011 , 10/23/2011 , 09/19/2013  Syncope...........................................................................................................[facility]  - Mobitz II AV block s/p Medtronic pacemaker placement 12/11/2008  [facility].....................................................................................................[facility]  - Pruritic rash  04/2009 > tried lotrisone, referred to [facility] August 06, 2009  Memory impairment , MMSE 27/30 8/25>refer to neuro   Family History:  mother had diabetes   Social History:  Patient states former smoker.  quit smoking in 1970  No ETOH  Retired       Objective:   Physical Exam   amb obese wm nad/ walking with rolling walker with hand brakes  Filed Vitals:   07/08/15 1423  BP: 134/82  Pulse: 90  Temp: 98.1 F (36.7 C)  TempSrc: Oral  Height: 5\' 11"  (1.803 m)  Weight: 248 lb (112.492 kg)  SpO2: 94%      Vital signs reviewed   Physical Exam:  General- Elderly gentleman,  No distress,  A&Ox3, pleasant, pale , chronically ill appearing  ENT: No sinus tenderness, TM  clear, pale nasal mucosa, no oral exudate,no post nasal drip, no LAN Cardiac: S1, S2, regular rate and rhythm, no murmur Chest: No wheeze/   no accessory muscle use, diminished bs in bases -faint BB crackles  Abd.: Soft Non-tender Ext: 1+ pitting edema bilateral lower extremities Neuro:  Alert/ nl sensorium, no motor def Skin: No rashes, warm and dry Psych: normal mood and behavior      Labs  reviewed:      Chemistry      Component Value Date/Time   NA 138 05/13/2015 1043   NA 137 01/05/2013 1501   K 4.0 05/13/2015 1043   K 3.8 01/05/2013 1501   CL 99 05/13/2015 1043   CL 99 06/08/2012 1257   CO2 27 05/13/2015 1043   CO2 26 01/05/2013 1501   BUN 14 05/13/2015 1043   BUN 13.9 01/05/2013 1501   CREATININE 0.82 05/13/2015 1043   CREATININE 0.8 01/05/2013 1501      Component Value Date/Time   CALCIUM 9.9 05/13/2015 1043   CALCIUM 10.1 01/05/2013 1501   ALKPHOS 77 01/24/2015 1148   ALKPHOS 48 08/10/2012 1117   AST 31 01/24/2015 1148   AST 17 08/10/2012 1117   ALT 50 01/24/2015 1148   ALT 14 08/10/2012 1117   BILITOT 0.6 01/24/2015 1148   BILITOT 0.82 08/10/2012 1117        Lab Results  Component Value Date   WBC 1.5 Repeated and verified X2.* 05/13/2015   HGB 9.6* 05/13/2015   HCT 27.8* 05/13/2015   MCV 100.7* 05/13/2015   PLT 74.0* 05/13/2015        Lab Results  Component Value Date   TSH 2.70 05/13/2015     Lab Results  Component Value Date   PROBNP 271.0* 05/13/2015            Tammy Parrett NP-C  [facility]  07/08/2015

## 2015-07-08 NOTE — Assessment & Plan Note (Signed)
Cont on lasix , plus extra dose for leg edema.  Low salt diet  Check bmet and bnp .

## 2015-07-09 NOTE — Progress Notes (Signed)
Chart and office note reviewed in detail  > agree with a/p as outlined    

## 2015-07-09 NOTE — Progress Notes (Signed)
Quick Note:  Called and spoke with pt's daughter. I reviewed the results and recs. She voiced understanding and had no further questions. Results faxed to Dr. [name]. ______

## 2015-07-15 ENCOUNTER — Other Ambulatory Visit: Payer: Self-pay

## 2015-07-15 ENCOUNTER — Ambulatory Visit (INDEPENDENT_AMBULATORY_CARE_PROVIDER_SITE_OTHER): Payer: Medicare Other | Admitting: Internal Medicine

## 2015-07-15 ENCOUNTER — Encounter: Payer: Self-pay | Admitting: Internal Medicine

## 2015-07-15 ENCOUNTER — Other Ambulatory Visit: Payer: Self-pay | Admitting: Internal Medicine

## 2015-07-15 VITALS — BP 108/62 | HR 71 | Ht 71.0 in | Wt 244.4 lb

## 2015-07-15 DIAGNOSIS — D61818 Other pancytopenia: Secondary | ICD-10-CM

## 2015-07-15 DIAGNOSIS — I441 Atrioventricular block, second degree: Secondary | ICD-10-CM

## 2015-07-15 DIAGNOSIS — I5032 Chronic diastolic (congestive) heart failure: Secondary | ICD-10-CM | POA: Diagnosis not present

## 2015-07-15 NOTE — Patient Instructions (Signed)
Medication Instructions:  Your physician has recommended you make the following change in your medication:  1) Stop Aspirin   Labwork: None ordered   Testing/Procedures: None ordered   Follow-Up: Your physician wants you to follow-up in: 6 months with Truitt Merle, NP and 12 months with Dr Vallery Ridge will receive a reminder letter in the mail two months in advance. If you don't receive a letter, please call our office to schedule the follow-up appointment.  Remote monitoring is used to monitor your Pacemaker from home. This monitoring reduces the number of office visits required to check your device to one time per year. It allows [place] to keep an eye on the functioning of your device to ensure it is working properly. You are scheduled for a device check from home on 10/14/15. You may send your transmission at any time that day. If you have a wireless device, the transmission will be sent automatically. After your physician reviews your transmission, you will receive a postcard with your next transmission date.     Any Other Special Instructions Will Be Listed Below (If Applicable).     If you need a refill on your cardiac medications before your next appointment, please call your pharmacy.

## 2015-07-15 NOTE — Progress Notes (Signed)
Electrophysiology Office Note   Date:  07/15/2015   ID:  DANILA CURETON, DOB [birth year], MRN [id number]  PCP:  Christinia Gully, MD   Primary Electrophysiologist: Thompson Grayer, MD    Chief Complaint  Patient presents with  . Atrial Fibrillation     History of Present Illness: [name] is a 82 y.o. male who presents today for electrophysiology evaluation.   He continues to have significant clinical decline.  He is frequently weak and fatigued.  He struggles with pancytopenia and has required frequent transfusions recently.   He has SOB with moderate actvity as well as chest discomfort at times.  He describes his chest discomfort as  "burning/ warmth".  This lasts only several minutes and occurs with exertion.  Today, he denies symptoms of palpitations, orthopnea, PND, lower extremity edema, claudication, dizziness, presyncope, syncope, bleeding, or neurologic sequela. The patient is tolerating medications without difficulties and is otherwise without complaint today.    Past Medical History  Diagnosis Date  . Second degree Mobitz II AV block 12/11/08    with transient syncope, resolved s/p PPM  . Paroxysmal atrial fibrillation ([place]) 03/16/11    diagnosed by PPM interrogation   Past Surgical History  Procedure Laterality Date  . Lymph node biopsy  04/2005    groin  . Sternotomy  2000  . Pericardiectomy  2000  . Penile prosthesis implant      and removal  . Pacemaker insertion  12/11/08    MDT implanted by Dr Rayann Heman  . Tee without cardioversion N/A 10/31/2012    Procedure: TRANSESOPHAGEAL ECHOCARDIOGRAM (TEE);  Surgeon: Lelon Perla, MD;  Location: [facility] ENDOSCOPY;  Service: Cardiovascular;  Laterality: N/A;  . Pacemaker lead removal Left 11/04/2012    Procedure: PACEMAKER LEAD REMOVAL;  Surgeon: Evans Lance, MD;  Location: [place];  Service: Cardiovascular;  Laterality: Left;  . Temporary pacemaker insertion Left 11/04/2012    Procedure: TEMPORARY PACEMAKER INSERTION;   Surgeon: Evans Lance, MD;  Location: [place];  Service: Cardiovascular;  Laterality: Left;  . Insert / replace / remove pacemaker  12/29/2012  . Bi-ventricular pacemaker upgrade N/A 12/29/2012    Procedure: BI-VENTRICULAR PACEMAKER UPGRADE;  Surgeon: Evans Lance, MD;  Location: [facility] CATH LAB;  Service: Cardiovascular;  Laterality: N/A;     Current Outpatient Prescriptions  Medication Sig Dispense Refill  . acetaminophen (TYLENOL) 500 MG tablet Take 500-1,000 mg by mouth every 6 (six) hours as needed for mild pain or headache. Reported on 03/05/2015    . albuterol (PROVENTIL HFA;VENTOLIN HFA) 108 (90 Base) MCG/ACT inhaler Inhale 2 puffs into the lungs every 4 (four) hours as needed for wheezing or shortness of breath.    [name] albuterol (PROVENTIL) (2.5 MG/3ML) 0.083% nebulizer solution Take 3 mLs (2.5 mg total) by nebulization every 4 (four) hours as needed for wheezing or shortness of breath. 75 mL 12  . ALPRAZolam (XANAX) 0.25 MG tablet Take 0.25 mg by mouth at bedtime as needed for anxiety or sleep.    . calcium-vitamin D (OS-CAL 500 + D) 500-200 MG-UNIT per tablet Take 1 tablet by mouth 2 (two) times daily.     . cetirizine (ZYRTEC) 10 MG tablet Take 10 mg by mouth daily as needed for allergies.     . Cholecalciferol (VITAMIN D) 1000 UNITS capsule Take 1,000 Units by mouth 2 (two) times daily.      [name] docusate sodium (COLACE) 100 MG capsule Take 100 mg by mouth daily as needed for moderate  constipation.     . famotidine (PEPCID) 20 MG tablet Take 20 mg by mouth at bedtime.     . finasteride (PROSCAR) 5 MG tablet TAKE 1 TABLET BY MOUTH EVERY MORNING 30 tablet 0  . fluticasone (FLONASE) 50 MCG/ACT nasal spray 1-2 puffs each nostril once or twice daily for allergy 16 g 12  . furosemide (LASIX) 40 MG tablet TAKE 1 TABLET BY MOUTH EVERY DAY MAY TAKE 1 EXTRA TABLET DAILY IF NEEDED FOR SWELLING IN LEGS 30 tablet 5  . KLOR-CON M20 20 MEQ tablet Take 20 mEq by mouth daily.  5  . levothyroxine (SYNTHROID,  LEVOTHROID) 25 MCG tablet TAKE 1 TABLET BY MOUTH EVERY MORNING BEFORE BREAKFAST 30 tablet 11  . lidocaine-prilocaine (EMLA) cream Apply 1 application topically daily as needed. For port access    . metFORMIN (GLUCOPHAGE) 500 MG tablet TAKE 1 TABLET BY MOUTH TWICE A DAY WITH FOOD 60 tablet 5  . mometasone-formoterol (DULERA) 200-5 MCG/ACT AERO Inhale 2 puffs into the lungs every 12 (twelve) hours.    . Multiple Vitamin (MULTIVITAMIN) tablet Take 1 tablet by mouth every morning.     . nitroGLYCERIN (NITROSTAT) 0.4 MG SL tablet Place 1 tablet (0.4 mg total) under the tongue every 5 (five) minutes as needed for chest pain. 90 tablet 3  . omeprazole (PRILOSEC) 20 MG capsule Take 40 mg by mouth daily before breakfast.     . oxybutynin (DITROPAN-XL) 10 MG 24 hr tablet Take 10 mg by mouth every morning. Reported on 05/13/2015    . OXYGEN 2 lpm with sleep    . polyethylene glycol (MIRALAX / GLYCOLAX) packet Take 17 g by mouth daily as needed for moderate constipation or severe constipation.     . traZODone (DESYREL) 150 MG tablet Take 1 tablet (150 mg total) by mouth at bedtime. 30 tablet 0  . KLOR-CON M20 20 MEQ tablet TAKE 1 TABLET DAILY 30 tablet 5   No current facility-administered medications for this visit.    Allergies:   Review of patient's allergies indicates no known allergies.   Social History:  The patient  reports that he quit smoking about 47 years ago. His smoking use included Cigarettes. He has a 30 pack-year smoking history. He has quit using smokeless tobacco. His smokeless tobacco use included Chew. He reports that he does not drink alcohol or use illicit drugs.   Family History:  The patient's family history includes Cancer in his sister; Diabetes in his mother; Liver cancer in his brother.    ROS:  Please see the history of present illness.   All other systems are reviewed and negative.    PHYSICAL EXAM: VS:  BP 108/62 mmHg  Pulse 71  Ht 5\' 11"  (1.803 m)  Wt 244 lb 6.4 oz  (110.859 kg)  BMI 34.10 kg/m2 , BMI Body mass index is 34.1 kg/(m^2). GEN: elderly and frail, in no acute distress HEENT: normal, pale conjunctiva (mild) Neck: no JVD, carotid bruits, or masses Cardiac: RRR; no murmurs, rubs, or gallops, trace edema  Respiratory:  clear to auscultation bilaterally, normal work of breathing GI: soft, nontender, nondistended, + BS MS: no deformity or atrophy Skin: warm and dry, device pocket is well healed Neuro:  Strength and sensation are intact Psych: euthymic mood, full affect  Device interrogation is reviewed today in detail.  See PaceArt for details.   Recent Labs: 01/18/2015: B Natriuretic Peptide 668.7* 07/01/2015: ALT 20; TSH 2.634 07/02/2015: Magnesium 1.5* 07/08/2015: BUN 19; Creatinine, Ser 0.81;  Hemoglobin 8.6 Repeated and verified X2.*; Platelets 81.0*; Potassium 4.2; Pro B Natriuretic peptide (BNP) 447.0*; Sodium 136    Lipid Panel     Component Value Date/Time   CHOL 142 11/08/2007 0000   TRIG 184* 11/08/2007 0000   HDL 28.6* 11/08/2007 0000   CHOLHDL 5.0 CALC 11/08/2007 0000   VLDL 37 11/08/2007 0000   LDLCALC 77 11/08/2007 0000   LDLDIRECT 66.9 11/08/2006 0944     Wt Readings from Last 3 Encounters:  07/15/15 244 lb 6.4 oz (110.859 kg)  07/08/15 248 lb (112.492 kg)  07/03/15 245 lb 2.4 oz (111.2 kg)     ASSESSMENT AND PLAN:  1.  Complete heart block Normal pacemaker function See Pace Art report No changes today  2. afib Not a candidate for oral anticoagulation due to pancytopenia OK to stop aspirin from my standpoint.  He will discuss with Dr [name]  3. htn Stable No change required today  End of life wishes were discussed in detail with patient and daughter today. He is clear that he wishes to be DNI/DNR at this time.  Order placed already in epic.  carelink Return in 6 months to see Truitt Merle   Current medicines are reviewed at length with the patient today.   The patient does not have concerns  regarding his medicines.  The following changes were made today:  none   Signed, Thompson Grayer, MD  07/15/2015 5:09 PM     [facility] [address] [place] [address] [phone] (office) [phone] (fax)

## 2015-07-16 ENCOUNTER — Other Ambulatory Visit: Payer: Self-pay | Admitting: Internal Medicine

## 2015-07-16 ENCOUNTER — Other Ambulatory Visit: Payer: Self-pay | Admitting: Adult Health

## 2015-07-16 NOTE — Addendum Note (Signed)
Addended by: Osa Craver on: 07/16/2015 12:15 PM   Modules accepted: Orders, Medications

## 2015-07-17 ENCOUNTER — Telehealth: Payer: Self-pay | Admitting: Oncology

## 2015-07-17 ENCOUNTER — Ambulatory Visit (HOSPITAL_BASED_OUTPATIENT_CLINIC_OR_DEPARTMENT_OTHER): Payer: Medicare Other | Admitting: Oncology

## 2015-07-17 ENCOUNTER — Other Ambulatory Visit (HOSPITAL_BASED_OUTPATIENT_CLINIC_OR_DEPARTMENT_OTHER): Payer: Medicare Other

## 2015-07-17 ENCOUNTER — Ambulatory Visit (HOSPITAL_BASED_OUTPATIENT_CLINIC_OR_DEPARTMENT_OTHER): Payer: Medicare Other

## 2015-07-17 ENCOUNTER — Ambulatory Visit: Payer: Medicare Other

## 2015-07-17 VITALS — BP 129/77 | HR 79 | Temp 99.0°F | Resp 20

## 2015-07-17 VITALS — BP 147/59 | HR 104 | Temp 97.7°F | Resp 19 | Ht 71.0 in | Wt 242.2 lb

## 2015-07-17 DIAGNOSIS — D469 Myelodysplastic syndrome, unspecified: Secondary | ICD-10-CM | POA: Diagnosis not present

## 2015-07-17 DIAGNOSIS — Z95828 Presence of other vascular implants and grafts: Secondary | ICD-10-CM

## 2015-07-17 DIAGNOSIS — D472 Monoclonal gammopathy: Secondary | ICD-10-CM

## 2015-07-17 DIAGNOSIS — Z8572 Personal history of non-Hodgkin lymphomas: Secondary | ICD-10-CM | POA: Diagnosis not present

## 2015-07-17 DIAGNOSIS — D649 Anemia, unspecified: Secondary | ICD-10-CM

## 2015-07-17 DIAGNOSIS — D709 Neutropenia, unspecified: Secondary | ICD-10-CM

## 2015-07-17 DIAGNOSIS — D61818 Other pancytopenia: Secondary | ICD-10-CM

## 2015-07-17 DIAGNOSIS — D696 Thrombocytopenia, unspecified: Secondary | ICD-10-CM | POA: Diagnosis not present

## 2015-07-17 DIAGNOSIS — E538 Deficiency of other specified B group vitamins: Secondary | ICD-10-CM

## 2015-07-17 DIAGNOSIS — Z452 Encounter for adjustment and management of vascular access device: Secondary | ICD-10-CM | POA: Diagnosis present

## 2015-07-17 LAB — CBC WITH DIFFERENTIAL/PLATELET
BASO%: 0.9 % (ref 0.0–2.0)
Basophils Absolute: 0 10*3/uL (ref 0.0–0.1)
EOS%: 0.9 % (ref 0.0–7.0)
Eosinophils Absolute: 0 10*3/uL (ref 0.0–0.5)
HEMATOCRIT: 24.2 % — AB (ref 38.4–49.9)
HGB: 8.3 g/dL — ABNORMAL LOW (ref 13.0–17.1)
LYMPH%: 56 % — AB (ref 14.0–49.0)
MCH: 33.6 pg — AB (ref 27.2–33.4)
MCHC: 34.3 g/dL (ref 32.0–36.0)
MCV: 98 fL (ref 79.3–98.0)
MONO#: 0 10*3/uL — AB (ref 0.1–0.9)
MONO%: 0.9 % (ref 0.0–14.0)
NEUT#: 0.5 10*3/uL — CL (ref 1.5–6.5)
NEUT%: 41.3 % (ref 39.0–75.0)
PLATELETS: 65 10*3/uL — AB (ref 140–400)
RBC: 2.47 10*6/uL — AB (ref 4.20–5.82)
RDW: 21.2 % — ABNORMAL HIGH (ref 11.0–14.6)
WBC: 1.2 10*3/uL — ABNORMAL LOW (ref 4.0–10.3)
lymph#: 0.7 10*3/uL — ABNORMAL LOW (ref 0.9–3.3)
nRBC: 2 % — ABNORMAL HIGH (ref 0–0)

## 2015-07-17 LAB — PREPARE RBC (CROSSMATCH)

## 2015-07-17 MED ORDER — FUROSEMIDE 10 MG/ML IJ SOLN
20.0000 mg | Freq: Once | INTRAMUSCULAR | Status: AC
  Administered 2015-07-17: 20 mg via INTRAVENOUS

## 2015-07-17 MED ORDER — SODIUM CHLORIDE 0.9 % IV SOLN
Freq: Once | INTRAVENOUS | Status: AC
  Administered 2015-07-17: 11:00:00 via INTRAVENOUS

## 2015-07-17 MED ORDER — HEPARIN SOD (PORK) LOCK FLUSH 100 UNIT/ML IV SOLN
250.0000 [IU] | INTRAVENOUS | Status: DC | PRN
  Filled 2015-07-17: qty 5

## 2015-07-17 MED ORDER — HEPARIN SOD (PORK) LOCK FLUSH 100 UNIT/ML IV SOLN
500.0000 [IU] | Freq: Every day | INTRAVENOUS | Status: AC | PRN
  Administered 2015-07-17: 500 [IU]
  Filled 2015-07-17: qty 5

## 2015-07-17 MED ORDER — FUROSEMIDE 10 MG/ML IJ SOLN
INTRAMUSCULAR | Status: AC
  Filled 2015-07-17: qty 2

## 2015-07-17 MED ORDER — SODIUM CHLORIDE 0.9 % IJ SOLN
10.0000 mL | INTRAMUSCULAR | Status: DC | PRN
  Administered 2015-07-17: 10 mL via INTRAVENOUS
  Filled 2015-07-17: qty 10

## 2015-07-17 MED ORDER — SODIUM CHLORIDE 0.9% FLUSH
3.0000 mL | INTRAVENOUS | Status: DC | PRN
  Filled 2015-07-17: qty 10

## 2015-07-17 NOTE — Addendum Note (Signed)
Addended by: Wyatt Portela on: 07/17/2015 10:06 AM   Modules accepted: Orders, SmartSet

## 2015-07-17 NOTE — Progress Notes (Signed)
Hematology and Oncology Follow Up Visit  [name] [id number] 1932/09/07 82 y.o. 07/17/2015 9:59 AM  CC: Victor Deem. [name], MD, FCCP    Principle Diagnosis: A 82 year old gentleman with the following diagnoses:   1. Follicular lymphoma, grade 3, stage III, diagnosed 2007. Continued to be in remission. 2. Pancytopenia associated with mild neutropenia and macrocytosis possibly indicating early myelodysplasia. Bone marrow  was repeated on 06/22/2012 confirmed the evidence of myelodysplastic syndrome 3. Monoclonal gammopathy of undetermined significance.  Prior Therapy:  1. Status post CHOP and rituximab.  He had complete response to therapy concluded in 2007.  No further imaging has been indicated at this time. 2. Status post bone marrow biopsy in April 2011, did not really show any evidence of myelodysplasia or metastatic lymphoma at this time.  Current therapy:  He also has been receiving intermittent PRBC transfusions to keep Hgb above 9.  Interim History: [name] presents today for a followup visit with his daughter. Since his last visit, he was hospitalized briefly between 06/30/2015 and 07/03/2015. He presented with low-grade fever and chills and symptoms have recovered at this time. No evidence of sepsis noted at that time. Since his discharge, he reports no major changes in his health. He is limited in his mobility and his performance status. He is ambulating short distances without any falls or syncope. He does report exertional dyspnea and occasional cough. He denied any hemoptysis or wheezing. He does not report any bleeding episodes such as epistaxis, hematochezia or melena. His appetite is reasonable and his quality of life remains limited.   He did not report any headaches or change in his vision. Does not report any seizures or confusion. He does not report any palpitation or orthopnea. Has not reported any edema. Does not report any wheezing but still has exertional dyspnea.   He does not report any lymphadenopathy or petechiae. Has not reported any skin rashes or genitourinary complaints. Remainder of review of systems unremarkable.   Medications: I have reviewed the patient's current medications Current Outpatient Prescriptions  Medication Sig Dispense Refill  . albuterol (PROVENTIL HFA;VENTOLIN HFA) 108 (90 Base) MCG/ACT inhaler Inhale 2 puffs into the lungs every 4 (four) hours as needed for wheezing or shortness of breath.    [name] albuterol (PROVENTIL) (2.5 MG/3ML) 0.083% nebulizer solution Take 3 mLs (2.5 mg total) by nebulization every 4 (four) hours as needed for wheezing or shortness of breath. 75 mL 12  . ALPRAZolam (XANAX) 0.25 MG tablet Take 0.25 mg by mouth at bedtime as needed for anxiety or sleep.    . calcium-vitamin D (OS-CAL 500 + D) 500-200 MG-UNIT per tablet Take 1 tablet by mouth 2 (two) times daily.     . chlorpheniramine (CHLOR-TRIMETON) 4 MG tablet Take 4 mg by mouth every 4 (four) hours as needed (drippy nose).    [name] dextromethorphan-guaiFENesin (MUCINEX DM) 30-600 MG 12hr tablet Take 1-2 tablets by mouth 2 (two) times daily as needed for cough.    . docusate sodium (COLACE) 100 MG capsule Take 100 mg by mouth daily.     . famotidine (PEPCID) 20 MG tablet Take 20 mg by mouth at bedtime.     . finasteride (PROSCAR) 5 MG tablet TAKE 1 TABLET BY MOUTH EVERY MORNING 30 tablet 0  . fluticasone (FLONASE) 50 MCG/ACT nasal spray 1-2 puffs each nostril once or twice daily for allergy 16 g 12  . furosemide (LASIX) 40 MG tablet TAKE 1 TABLET BY MOUTH EVERY DAY MAY TAKE 1  EXTRA TABLET DAILY IF NEEDED FOR SWELLING IN LEGS 30 tablet 5  . hydrOXYzine (ATARAX/VISTARIL) 25 MG tablet Take 1-2 tabs by mouth every 4-6 hours as needed for itching    . KLOR-CON M20 20 MEQ tablet Take 20 mEq by mouth daily. Reported on 07/16/2015  5  . levothyroxine (SYNTHROID, LEVOTHROID) 25 MCG tablet TAKE 1 TABLET BY MOUTH EVERY MORNING BEFORE BREAKFAST 30 tablet 11  . metFORMIN  (GLUCOPHAGE) 500 MG tablet TAKE 1 TABLET BY MOUTH TWICE A DAY WITH FOOD (Patient taking differently: TAKE 1/2 TABLET BY MOUTH TWICE A DAY WITH FOOD) 60 tablet 5  . mometasone (NASONEX) 50 MCG/ACT nasal spray Place 2 sprays into the nose 2 (two) times daily.    . mometasone-formoterol (DULERA) 200-5 MCG/ACT AERO Inhale 2 puffs into the lungs every 12 (twelve) hours.    . Multiple Vitamin (MULTIVITAMIN) tablet Take 1 tablet by mouth every morning.     . nitroGLYCERIN (NITROSTAT) 0.4 MG SL tablet Place 1 tablet (0.4 mg total) under the tongue every 5 (five) minutes as needed for chest pain. 90 tablet 3  . omeprazole (PRILOSEC) 20 MG capsule Take 40 mg by mouth daily before breakfast.     . oxybutynin (DITROPAN-XL) 10 MG 24 hr tablet Take 10 mg by mouth every morning. Reported on 05/13/2015    . OXYGEN 2 lpm with sleep    . polyethylene glycol (MIRALAX / GLYCOLAX) packet Take 17 g by mouth daily as needed for moderate constipation or severe constipation.     . traMADol (ULTRAM) 50 MG tablet Take 1-1/2 tablet every 4 hours as needed for severe cough and pain    . traZODone (DESYREL) 150 MG tablet Take 1 tablet (150 mg total) by mouth at bedtime. 30 tablet 0   No current facility-administered medications for this visit.     Allergies: No Known Allergies  Past Medical History, Surgical history, Social history, and Family History were reviewed and updated.    Physical Exam: Blood pressure 147/59, pulse 104, temperature 97.7 F (36.5 C), resp. rate 19, height _0  (1.803 m), weight 242 lb 3.2 oz (109.861 kg), SpO2 98 %. ECOG: 1 General appearance: Elderly gentleman without distress appeared pale. Head: Normocephalic, without obvious abnormality. No oral ulcers or lesions. Neck: no adenopathy. No palpable masses. Lymph nodes: Cervical, supraclavicular, and axillary nodes normal. Heart:regular rate and rhythm, S1, S2 normal, no murmur, click, rub or gallop Lung:chest clear, no wheezing, rales,  normal symmetric air entry. No dullness to percussion. Abdomen: soft, non-tender, without masses or organomegaly. No shifting dullness or ascites. EXT:no erythema, induration, or nodules. No edema noted. Skin: No rashes or lesions noted.    Lab Results: Lab Results  Component Value Date   WBC 1.2* 07/17/2015   HGB 8.3* 07/17/2015   HCT 24.2* 07/17/2015   MCV 98.0 07/17/2015   PLT 65* 07/17/2015     Chemistry      Component Value Date/Time   NA 136 07/08/2015 1520   NA 137 01/05/2013 1501   K 4.2 07/08/2015 1520   K 3.8 01/05/2013 1501   CL 96 07/08/2015 1520   CL 99 06/08/2012 1257   CO2 31 07/08/2015 1520   CO2 26 01/05/2013 1501   BUN 19 07/08/2015 1520   BUN 13.9 01/05/2013 1501   CREATININE 0.81 07/08/2015 1520   CREATININE 0.8 01/05/2013 1501      Component Value Date/Time   CALCIUM 10.1 07/08/2015 1520   CALCIUM 10.1 01/05/2013 1501   ALKPHOS  24* 07/01/2015 0630   ALKPHOS 48 08/10/2012 1117   AST 15 07/01/2015 0630   AST 17 08/10/2012 1117   ALT 20 07/01/2015 0630   ALT 14 08/10/2012 1117   BILITOT 0.5 07/01/2015 0630   BILITOT 0.82 08/10/2012 1117      Impression and Plan:  This is a 82 year old gentleman with the following issues:  1. Follicular lymphoma diagnosed in 2007. He continues to be in remission at this time. 2. Myelodysplastic syndrome. He is status post bone marrow biopsy was done on 06/22/2012. He is currently on supportive transfusion only due to the lack of response from Aranesp. He declined 5-azacytidine chemotherapy given the potential complications and his transfusion needs. For the time being we'll continue with supportive transfusions only and he'll receive 2 units of packed red cells today. 3. Neutropenia: Neutropenic precautions were given to the patient today. We will repeated today and discuss with his daughter. 4. Monoclonal gammopathy.  His M-spike had been less than 1 g/dL, his bone marrow biopsy continued to show a less than 8%  plasma cells indicating most likely multiple myeloma.  Last SPEP was done in September 2016 and this will be repeated annually. 5. Thrombocytopenia: Likely related to his myelodysplasia without any bleeding. No transfusion is noted on his aspirin has been discontinued. 6. IV access: Port-A-Cath is in place without complications or bleeding. 7. Followup every 2 weeks for an injection and possible transfusion. He will have a clinical visit in 2 months.  [facility], MD 6/21/20179:59 AM

## 2015-07-17 NOTE — Telephone Encounter (Signed)
Gave and printed appt sched and avs for pt for July and Aug °

## 2015-07-17 NOTE — Patient Instructions (Signed)

## 2015-07-17 NOTE — Progress Notes (Signed)
Patient took his own Benadryl and Tylenol

## 2015-07-17 NOTE — Patient Instructions (Signed)
 Blood Transfusion  A blood transfusion is a procedure in which you receive donated blood through an IV tube. You may need a blood transfusion because of illness, surgery, or injury. The blood may come from a donor, or it may be your own blood that you donated previously. The blood given in a transfusion is made up of different types of cells. You may receive:  Red blood cells. These carry oxygen and replace lost blood.  Platelets. These control bleeding.  Plasma. Thishelps blood to clot. If you have hemophilia or another clotting disorder, you may also receive other types of blood products. LET YOUR HEALTH CARE PROVIDER KNOW ABOUT:  Any allergies you have.  All medicines you are taking, including vitamins, herbs, eye drops, creams, and over-the-counter medicines.  Previous problems you or members of your family have had with the use of anesthetics.  Any blood disorders you have.  Previous surgeries you have had.  Any medical conditions you may have.  Any previous reactions you have had during a blood transfusion.  RISKS AND COMPLICATIONS Generally, this is a safe procedure. However, problems may occur, including:  Having an allergic reaction to something in the donated blood.  Fever. This may be a reaction to the white blood cells in the transfused blood.  Iron overload. This can happen from having many transfusions.  Transfusion-related acute lung injury (TRALI). This is a rare reaction that causes lung damage. The cause is not known.TRALI can occur within hours of a transfusion or several days later.  Sudden (acute) or delayed hemolytic reactions. This happens if your blood does not match the cells in your transfusion. Your body's defense system (immune system) may try to attack the new cells. This complication is rare.  Infection. This is rare. BEFORE THE PROCEDURE  You may have a blood test to determine your blood type. This is necessary to know what kind of blood  your body will accept.  If you are going to have a planned surgery, you may donate your own blood. This may be done in case you need to have a transfusion.  If you have had an allergic reaction to a transfusion in the past, you may be given medicine to help prevent a reaction. Take this medicine only as directed by your health care provider.  You will have your temperature, blood pressure, and pulse monitored before the transfusion. PROCEDURE   An IV will be started in your hand or arm.  The bag of donated blood will be attached to your IV tube and given into your vein.  Your temperature, blood pressure, and pulse will be monitored regularly during the transfusion. This monitoring is done to detect early signs of a transfusion reaction.  If you have any signs or symptoms of a reaction, your transfusion will be stopped and you may be given medicine.  When the transfusion is over, your IV will be removed.  Pressure may be applied to the IV site for a few minutes.  A bandage (dressing) will be applied. The procedure may vary among health care providers and hospitals. AFTER THE PROCEDURE  Your blood pressure, temperature, and pulse will be monitored regularly.   This information is not intended to replace advice given to you by your health care provider. Make sure you discuss any questions you have with your health care provider.   Document Released: 01/10/2000 Document Revised: 02/02/2014 Document Reviewed: 11/22/2013 Elsevier Interactive Patient Education 2016 Elsevier Inc.   Anemia, Nonspecific Anemia is a   in which the concentration of red blood cells or hemoglobin in the blood is below normal. Hemoglobin is a substance in red blood cells that carries oxygen to the tissues of the body. Anemia results in not enough oxygen reaching these tissues.  CAUSES  Common causes of anemia include:   Excessive bleeding. Bleeding may be internal or external. This includes excessive  bleeding from periods (in women) or from the intestine.   Poor nutrition.   Chronic kidney, thyroid, and liver disease.  Bone marrow disorders that decrease red blood cell production.  Cancer and treatments for cancer.  HIV, AIDS, and their treatments.  Spleen problems that increase red blood cell destruction.  Blood disorders.  Excess destruction of red blood cells due to infection, medicines, and autoimmune disorders. SIGNS AND SYMPTOMS   Minor weakness.   Dizziness.   Headache.  Palpitations.   Shortness of breath, especially with exercise.   Paleness.  Cold sensitivity.  Indigestion.  Nausea.  Difficulty sleeping.  Difficulty concentrating. Symptoms may occur suddenly or they may develop slowly.  DIAGNOSIS  Additional blood tests are often needed. These help your health care provider determine the best treatment. Your health care provider will check your stool for blood and look for other causes of blood loss.  TREATMENT  Treatment varies depending on the cause of the anemia. Treatment can include:   Supplements of iron, vitamin 123456, or folic acid.   Hormone medicines.   A blood transfusion. This may be needed if blood loss is severe.   Hospitalization. This may be needed if there is significant continual blood loss.   Dietary changes.  Spleen removal. HOME CARE INSTRUCTIONS Keep all follow-up appointments. It often takes many weeks to correct anemia, and having your health care provider check on your condition and your response to treatment is very important. SEEK IMMEDIATE MEDICAL CARE IF:   You develop extreme weakness, shortness of breath, or chest pain.   You become dizzy or have trouble concentrating.  You develop heavy vaginal bleeding.   You develop a rash.   You have bloody or black, tarry stools.   You faint.   You vomit up blood.   You vomit repeatedly.   You have abdominal pain.  You have a fever or  persistent symptoms for more than 2-3 days.   You have a fever and your symptoms suddenly get worse.   You are dehydrated.  MAKE SURE YOU:  Understand these instructions.  Will watch your condition.  Will get help right away if you are not doing well or get worse.   This information is not intended to replace advice given to you by your health care provider. Make sure you discuss any questions you have with your health care provider.   Document Released: 02/20/2004 Document Revised: 09/14/2012 Document Reviewed: 07/08/2012 Elsevier Interactive Patient Education 2016 [employer].   Neutropenia Neutropenia is a condition that occurs when the level of a certain type of white blood cell (neutrophil) in your body becomes lower than normal. Neutrophils are made in the bone marrow and fight infections. These cells protect against bacteria and viruses. The fewer neutrophils you have, and the longer your body remains without them, the greater your risk of getting a severe infection becomes. CAUSES  The cause of neutropenia may be hard to determine. However, it is usually due to 3 main problems:   Decreased production of neutrophils. This may be due to:  Certain medicines such as chemotherapy.  Genetic problems.  Cancer.  Radiation treatments.  Vitamin deficiency.  Some pesticides.  Increased destruction of neutrophils. This may be due to:  Overwhelming infections.  Hemolytic anemia. This is when the body destroys its own blood cells.  Chemotherapy.  Neutrophils moving to areas of the body where they cannot fight infections. This may be due to:  Dialysis procedures.  Conditions where the spleen becomes enlarged. Neutrophils are held in the spleen and are not available to the rest of the body.  Overwhelming infections. The neutrophils are held in the area of the infection and are not available to the rest of the body. SYMPTOMS  There are no specific symptoms of  neutropenia. The lack of neutrophils can result in an infection, and an infection can cause various problems. DIAGNOSIS  Diagnosis is made by a blood test. A complete blood count is performed. The normal level of neutrophils in human blood differs with age and race. Infants have lower counts than older children and adults. African Americans have lower counts than Caucasians or Asians. The average adult level is 1500 cells/mm3 of blood. Neutrophil counts are interpreted as follows:  Greater than 1000 cells/mm3 gives normal protection against infection.  500 to 1000 cells/mm3 gives an increased risk for infection.  200 to 500 cells/mm3 is a greater risk for severe infection.  Lower than 200 cells/mm3 is a marked risk of infection. This may require hospitalization and treatment with antibiotic medicines. TREATMENT  Treatment depends on the underlying cause, severity, and presence of infections or symptoms. It also depends on your health. Your caregiver will discuss the treatment plan with you. Mild cases are often easily treated and have a good outcome. Preventative measures may also be started to limit your risk of infections. Treatment can include:  Taking antibiotics.  Stopping medicines that are known to cause neutropenia.  Correcting nutritional deficiencies by eating green vegetables to supply folic acid and taking vitamin B supplements.  Stopping exposure to pesticides if your neutropenia is related to pesticide exposure.  Taking a blood growth factor called sargramostim, pegfilgrastim, or filgrastim if you are undergoing chemotherapy for cancer. This stimulates white blood cell production.  Removal of the spleen if you have Felty's syndrome and have repeated infections. HOME CARE INSTRUCTIONS   Follow your caregiver's instructions about when you need to have blood work done.  Wash your hands often. Make sure others who come in contact with you also wash their hands.  Wash raw  fruits and vegetables before eating them. They can carry bacteria and fungi.  Avoid people with colds or spreadable (contagious) diseases (chickenpox, herpes zoster, influenza).  Avoid large crowds.  Avoid construction areas. The dust can release fungus into the air.  Be cautious around children in daycare or school environments.  Take care of your respiratory system by coughing and deep breathing.  Bathe daily.  Protect your skin from cuts and burns.  Do not work in the garden or with flowers and plants.  Care for the mouth before and after meals by brushing with a soft toothbrush. If you have mucositis, do not use mouthwash. Mouthwash contains alcohol and can dry out the mouth even more.  Clean the area between the genitals and the anus (perineal area) after urination and bowel movements. Women need to wipe from front to back.  Use a water soluble lubricant during sexual intercourse and practice good hygiene after. Do not have intercourse if you are severely neutropenic. Check with your caregiver for guidelines.  Exercise daily as tolerated.  Avoid people who were vaccinated with a live vaccine in the past 30 days. You should not receive live vaccines (polio, typhoid).  Do not provide direct care for pets. Avoid animal droppings. Do not clean litter boxes and bird cages.  Do not share food utensils.  Do not use tampons, enemas, or rectal suppositories unless directed by your caregiver.  Use an electric razor to remove hair.  Wash your hands after handling magazines, letters, and newspapers. SEEK IMMEDIATE MEDICAL CARE IF:   You have a fever.  You have chills or start to shake.  You feel nauseous or vomit.  You develop mouth sores.  You develop aches and pains.  You have redness and swelling around open wounds.  Your skin is warm to the touch.  You have pus coming from your wounds.  You develop swollen lymph nodes.  You feel weak or fatigued.  You develop  red streaks on the skin. MAKE SURE YOU:  Understand these instructions.  Will watch your condition.  Will get help right away if you are not doing well or get worse.   This information is not intended to replace advice given to you by your health care provider. Make sure you discuss any questions you have with your health care provider.   Document Released: 07/04/2001 Document Revised: 04/06/2011 Document Reviewed: 07/25/2014 Elsevier Interactive Patient Education [employer].

## 2015-07-18 LAB — TYPE AND SCREEN
ABO/RH(D): A POS
Antibody Screen: NEGATIVE
UNIT DIVISION: 0
Unit division: 0

## 2015-07-29 ENCOUNTER — Ambulatory Visit (HOSPITAL_COMMUNITY)
Admission: RE | Admit: 2015-07-29 | Discharge: 2015-07-29 | Disposition: A | Discharge status: Home / Self Care | Payer: Medicare Other | Source: Ambulatory Visit | Attending: Oncology | Admitting: Oncology

## 2015-07-29 DIAGNOSIS — D649 Anemia, unspecified: Secondary | ICD-10-CM

## 2015-07-29 DIAGNOSIS — D469 Myelodysplastic syndrome, unspecified: Secondary | ICD-10-CM

## 2015-07-31 ENCOUNTER — Ambulatory Visit (HOSPITAL_BASED_OUTPATIENT_CLINIC_OR_DEPARTMENT_OTHER): Payer: Medicare Other

## 2015-07-31 ENCOUNTER — Other Ambulatory Visit: Payer: Self-pay | Admitting: *Deleted

## 2015-07-31 ENCOUNTER — Encounter: Payer: Self-pay | Admitting: *Deleted

## 2015-07-31 ENCOUNTER — Ambulatory Visit: Payer: Medicare Other

## 2015-07-31 ENCOUNTER — Other Ambulatory Visit (HOSPITAL_BASED_OUTPATIENT_CLINIC_OR_DEPARTMENT_OTHER): Payer: Medicare Other

## 2015-07-31 VITALS — BP 125/82 | HR 71 | Temp 98.0°F | Resp 16

## 2015-07-31 DIAGNOSIS — D469 Myelodysplastic syndrome, unspecified: Secondary | ICD-10-CM

## 2015-07-31 DIAGNOSIS — D649 Anemia, unspecified: Secondary | ICD-10-CM

## 2015-07-31 DIAGNOSIS — Z95828 Presence of other vascular implants and grafts: Secondary | ICD-10-CM

## 2015-07-31 DIAGNOSIS — D61818 Other pancytopenia: Secondary | ICD-10-CM

## 2015-07-31 LAB — CBC WITH DIFFERENTIAL/PLATELET
BASO%: 0.2 % (ref 0.0–2.0)
BASOS ABS: 0 10*3/uL (ref 0.0–0.1)
EOS ABS: 0 10*3/uL (ref 0.0–0.5)
EOS%: 1 % (ref 0.0–7.0)
HCT: 26.1 % — ABNORMAL LOW (ref 38.4–49.9)
HGB: 8.8 g/dL — ABNORMAL LOW (ref 13.0–17.1)
LYMPH%: 55.9 % — AB (ref 14.0–49.0)
MCH: 32.9 pg (ref 27.2–33.4)
MCHC: 33.6 g/dL (ref 32.0–36.0)
MCV: 97.9 fL (ref 79.3–98.0)
MONO#: 0 10*3/uL — AB (ref 0.1–0.9)
MONO%: 0.5 % (ref 0.0–14.0)
NEUT%: 42.4 % (ref 39.0–75.0)
NEUTROS ABS: 0.5 10*3/uL — AB (ref 1.5–6.5)
PLATELETS: 62 10*3/uL — AB (ref 140–400)
RBC: 2.67 10*6/uL — AB (ref 4.20–5.82)
RDW: 26 % — ABNORMAL HIGH (ref 11.0–14.6)
WBC: 1.2 10*3/uL — AB (ref 4.0–10.3)
lymph#: 0.7 10*3/uL — ABNORMAL LOW (ref 0.9–3.3)

## 2015-07-31 LAB — PREPARE RBC (CROSSMATCH)

## 2015-07-31 MED ORDER — ACETAMINOPHEN 325 MG PO TABS: 650.0000 mg | ORAL_TABLET | Freq: Once | ORAL | Status: DC

## 2015-07-31 MED ORDER — HEPARIN SOD (PORK) LOCK FLUSH 100 UNIT/ML IV SOLN
500.0000 [IU] | Freq: Once | INTRAVENOUS | Status: AC | PRN
  Administered 2015-07-31: 500 [IU] via INTRAVENOUS
  Filled 2015-07-31: qty 5

## 2015-07-31 MED ORDER — SODIUM CHLORIDE 0.9 % IJ SOLN
10.0000 mL | INTRAMUSCULAR | Status: DC | PRN
  Administered 2015-07-31: 10 mL via INTRAVENOUS
  Filled 2015-07-31: qty 10

## 2015-07-31 MED ORDER — DIPHENHYDRAMINE HCL 25 MG PO CAPS: 25.0000 mg | ORAL_CAPSULE | Freq: Once | ORAL | Status: DC

## 2015-07-31 MED ORDER — SODIUM CHLORIDE 0.9% FLUSH
10.0000 mL | INTRAVENOUS | Status: AC | PRN
  Administered 2015-07-31: 10 mL
  Filled 2015-07-31: qty 10

## 2015-07-31 MED ORDER — HEPARIN SOD (PORK) LOCK FLUSH 100 UNIT/ML IV SOLN
250.0000 [IU] | INTRAVENOUS | Status: DC | PRN
  Filled 2015-07-31: qty 5

## 2015-07-31 MED ORDER — SODIUM CHLORIDE 0.9 % IV SOLN
250.0000 mL | Freq: Once | INTRAVENOUS | Status: AC
  Administered 2015-07-31: 250 mL via INTRAVENOUS

## 2015-07-31 NOTE — Patient Instructions (Signed)
Blood Transfusion, Care After  These instructions give you information about caring for yourself after your procedure. Your doctor may also give you more specific instructions. Call your doctor if you have any problems or questions after your procedure.   HOME CARE   Take medicines only as told by your doctor. Ask your doctor if you can take an over-the-counter pain reliever if you have a fever or headache a day or two after your procedure.   Return to your normal activities as told by your doctor.  GET HELP IF:    You develop redness or irritation at your IV site.   You have a fever, chills, or a headache that does not go away.   Your pee (urine) is darker than normal.   Your urine turns:    Pink.    Red.    Brown.   The white part of your eye turns yellow (jaundice).   You feel weak after doing your normal activities.  GET HELP RIGHT AWAY IF:    You have trouble breathing.   You have fever and chills and you also have:    Anxiety.    Chest or back pain.    Flushed or pink skin.    Clammy or sweaty skin.    A fast heartbeat.    A sick feeling in your stomach (nausea).     This information is not intended to replace advice given to you by your health care provider. Make sure you discuss any questions you have with your health care provider.     Document Released: 02/02/2014 Document Reviewed: 02/02/2014  [employer]vier Interactive Patient Education 2016 [employer]

## 2015-07-31 NOTE — Patient Instructions (Signed)

## 2015-08-01 LAB — TYPE AND SCREEN
ABO/RH(D): A POS
Antibody Screen: NEGATIVE
UNIT DIVISION: 0

## 2015-08-13 ENCOUNTER — Ambulatory Visit (HOSPITAL_BASED_OUTPATIENT_CLINIC_OR_DEPARTMENT_OTHER): Payer: Medicare Other | Admitting: Oncology

## 2015-08-13 ENCOUNTER — Other Ambulatory Visit: Payer: Self-pay | Admitting: Oncology

## 2015-08-13 ENCOUNTER — Ambulatory Visit (HOSPITAL_BASED_OUTPATIENT_CLINIC_OR_DEPARTMENT_OTHER): Payer: Medicare Other

## 2015-08-13 ENCOUNTER — Other Ambulatory Visit (HOSPITAL_BASED_OUTPATIENT_CLINIC_OR_DEPARTMENT_OTHER): Payer: Medicare Other

## 2015-08-13 ENCOUNTER — Telehealth: Payer: Self-pay | Admitting: Oncology

## 2015-08-13 ENCOUNTER — Other Ambulatory Visit: Payer: Self-pay | Admitting: *Deleted

## 2015-08-13 ENCOUNTER — Ambulatory Visit: Payer: Medicare Other

## 2015-08-13 VITALS — BP 124/62 | HR 108 | Temp 98.3°F | Resp 18 | Ht 71.0 in | Wt 248.8 lb

## 2015-08-13 VITALS — BP 123/65 | HR 81 | Temp 97.6°F | Resp 16

## 2015-08-13 DIAGNOSIS — D469 Myelodysplastic syndrome, unspecified: Secondary | ICD-10-CM

## 2015-08-13 DIAGNOSIS — D472 Monoclonal gammopathy: Secondary | ICD-10-CM | POA: Diagnosis not present

## 2015-08-13 DIAGNOSIS — Z8572 Personal history of non-Hodgkin lymphomas: Secondary | ICD-10-CM

## 2015-08-13 DIAGNOSIS — D649 Anemia, unspecified: Secondary | ICD-10-CM

## 2015-08-13 DIAGNOSIS — D61818 Other pancytopenia: Secondary | ICD-10-CM

## 2015-08-13 DIAGNOSIS — D709 Neutropenia, unspecified: Secondary | ICD-10-CM

## 2015-08-13 DIAGNOSIS — Z95828 Presence of other vascular implants and grafts: Secondary | ICD-10-CM

## 2015-08-13 LAB — CBC WITH DIFFERENTIAL/PLATELET
BASO%: 0 % (ref 0.0–2.0)
BASOS ABS: 0 10*3/uL (ref 0.0–0.1)
EOS ABS: 0 10*3/uL (ref 0.0–0.5)
EOS%: 1 % (ref 0.0–7.0)
HCT: 23.3 % — ABNORMAL LOW (ref 38.4–49.9)
HGB: 7.9 g/dL — ABNORMAL LOW (ref 13.0–17.1)
LYMPH#: 0.6 10*3/uL — AB (ref 0.9–3.3)
LYMPH%: 61 % — ABNORMAL HIGH (ref 14.0–49.0)
MCH: 32.6 pg (ref 27.2–33.4)
MCHC: 33.9 g/dL (ref 32.0–36.0)
MCV: 96.3 fL (ref 79.3–98.0)
MONO#: 0 10*3/uL — AB (ref 0.1–0.9)
MONO%: 0 % (ref 0.0–14.0)
NEUT%: 38 % — ABNORMAL LOW (ref 39.0–75.0)
NEUTROS ABS: 0.4 10*3/uL — AB (ref 1.5–6.5)
Platelets: 41 10*3/uL — ABNORMAL LOW (ref 140–400)
RBC: 2.42 10*6/uL — AB (ref 4.20–5.82)
RDW: 23.4 % — AB (ref 11.0–14.6)
WBC: 1.1 10*3/uL — AB (ref 4.0–10.3)

## 2015-08-13 LAB — PREPARE RBC (CROSSMATCH)

## 2015-08-13 MED ORDER — DIPHENHYDRAMINE HCL 25 MG PO CAPS: 25.0000 mg | ORAL_CAPSULE | Freq: Once | ORAL | Status: DC

## 2015-08-13 MED ORDER — SODIUM CHLORIDE 0.9% FLUSH
10.0000 mL | INTRAVENOUS | Status: AC | PRN
  Administered 2015-08-13: 10 mL
  Filled 2015-08-13: qty 10

## 2015-08-13 MED ORDER — SODIUM CHLORIDE 0.9 % IJ SOLN
10.0000 mL | INTRAMUSCULAR | Status: DC | PRN
  Administered 2015-08-13: 10 mL via INTRAVENOUS
  Filled 2015-08-13: qty 10

## 2015-08-13 MED ORDER — ACETAMINOPHEN 325 MG PO TABS: 650.0000 mg | ORAL_TABLET | Freq: Once | ORAL | Status: DC

## 2015-08-13 MED ORDER — HEPARIN SOD (PORK) LOCK FLUSH 100 UNIT/ML IV SOLN
500.0000 [IU] | Freq: Every day | INTRAVENOUS | Status: AC | PRN
  Administered 2015-08-13: 500 [IU]
  Filled 2015-08-13: qty 5

## 2015-08-13 MED ORDER — FUROSEMIDE 10 MG/ML IJ SOLN
INTRAMUSCULAR | Status: AC
  Filled 2015-08-13: qty 2

## 2015-08-13 MED ORDER — FUROSEMIDE 10 MG/ML IJ SOLN
20.0000 mg | Freq: Once | INTRAMUSCULAR | Status: AC
Start: 2015-08-13 — End: 2015-08-13
  Administered 2015-08-13: 20 mg via INTRAVENOUS

## 2015-08-13 NOTE — Patient Instructions (Signed)

## 2015-08-13 NOTE — Progress Notes (Signed)
Hematology and Oncology Follow Up Visit  [name] [id number] 08-16-1932 82 y.o. 08/13/2015 9:30 AM  CC: [name]. [name], MD, FCCP    Principle Diagnosis: 82 year old gentleman with the following diagnoses:   1. Follicular lymphoma, grade 3, stage III, diagnosed 2007. Continued to be in remission. 2. Pancytopenia associated with mild neutropenia and macrocytosis possibly indicating early myelodysplasia. Bone marrow  was repeated on 06/22/2012 confirmed the evidence of myelodysplastic syndrome 3. Monoclonal gammopathy of undetermined significance.  Prior Therapy:  1. Status post CHOP and rituximab.  He had complete response to therapy concluded in 2007.  No further imaging has been indicated at this time. 2. Status post bone marrow biopsy in April 2011, did not really show any evidence of myelodysplasia or metastatic lymphoma at this time.  Current therapy: PRBC transfusions to keep Hgb above 9.  Interim History: [name] presents today for a followup visit with his daughter. Since his last visit, he reports no major changes in his health. He received 1 unit of packed red cell transfusion 2 weeks ago and tolerated it well. He is limited in his mobility and uses a walker inside his house. He denied any falls or syncope. He does report exertional dyspnea and occasional cough which is unchanged. He denied any hemoptysis or wheezing. He does not report any bleeding episodes such as epistaxis, hematochezia or melena. His appetite is reasonable and his weight have not changed. He denied any recurrent sinopulmonary infections or cellulitis. No recent hospitalizations.   He did not report any headaches, blurry vision syncope or seizures. He does not report any palpitation or orthopnea. Has not reported any edema. Does not report any wheezing but still has exertional dyspnea.  He does not report any lymphadenopathy or petechiae. Has not reported any skin rashes or genitourinary complaints. He  denied any nausea, vomiting, abdominal pain. He denied any early satiety or distention. Remainder of review of systems unremarkable.   Medications: I have reviewed the patient's current medications Current Outpatient Prescriptions  Medication Sig Dispense Refill  . albuterol (PROVENTIL HFA;VENTOLIN HFA) 108 (90 Base) MCG/ACT inhaler Inhale 2 puffs into the lungs every 4 (four) hours as needed for wheezing or shortness of breath.    [name] albuterol (PROVENTIL) (2.5 MG/3ML) 0.083% nebulizer solution Take 3 mLs (2.5 mg total) by nebulization every 4 (four) hours as needed for wheezing or shortness of breath. 75 mL 12  . ALPRAZolam (XANAX) 0.25 MG tablet Take 0.25 mg by mouth at bedtime as needed for anxiety or sleep.    . calcium-vitamin D (OS-CAL 500 + D) 500-200 MG-UNIT per tablet Take 1 tablet by mouth 2 (two) times daily.     . chlorpheniramine (CHLOR-TRIMETON) 4 MG tablet Take 4 mg by mouth every 4 (four) hours as needed (drippy nose).    [name] dextromethorphan-guaiFENesin (MUCINEX DM) 30-600 MG 12hr tablet Take 1-2 tablets by mouth 2 (two) times daily as needed for cough.    . docusate sodium (COLACE) 100 MG capsule Take 100 mg by mouth daily.     . famotidine (PEPCID) 20 MG tablet Take 20 mg by mouth at bedtime.     . finasteride (PROSCAR) 5 MG tablet TAKE 1 TABLET BY MOUTH EVERY MORNING 30 tablet 5  . fluticasone (FLONASE) 50 MCG/ACT nasal spray 1-2 puffs each nostril once or twice daily for allergy 16 g 12  . furosemide (LASIX) 40 MG tablet TAKE 1 TABLET BY MOUTH EVERY DAY MAY TAKE 1 EXTRA TABLET DAILY IF NEEDED FOR  SWELLING IN LEGS 30 tablet 5  . hydrOXYzine (ATARAX/VISTARIL) 25 MG tablet Take 1-2 tabs by mouth every 4-6 hours as needed for itching    . KLOR-CON M20 20 MEQ tablet Take 20 mEq by mouth daily. Reported on 07/16/2015  5  . KLOR-CON M20 20 MEQ tablet TAKE 1 TABLET DAILY 30 tablet 5  . levothyroxine (SYNTHROID, LEVOTHROID) 25 MCG tablet TAKE 1 TABLET BY MOUTH EVERY MORNING BEFORE  BREAKFAST 30 tablet 11  . metFORMIN (GLUCOPHAGE) 500 MG tablet TAKE 1 TABLET BY MOUTH TWICE A DAY WITH FOOD (Patient taking differently: TAKE 1/2 TABLET BY MOUTH TWICE A DAY WITH FOOD) 60 tablet 5  . mometasone (NASONEX) 50 MCG/ACT nasal spray Place 2 sprays into the nose 2 (two) times daily.    . mometasone-formoterol (DULERA) 200-5 MCG/ACT AERO Inhale 2 puffs into the lungs every 12 (twelve) hours.    . Multiple Vitamin (MULTIVITAMIN) tablet Take 1 tablet by mouth every morning.     . nitroGLYCERIN (NITROSTAT) 0.4 MG SL tablet Place 1 tablet (0.4 mg total) under the tongue every 5 (five) minutes as needed for chest pain. 90 tablet 3  . omeprazole (PRILOSEC) 20 MG capsule Take 40 mg by mouth daily before breakfast.     . oxybutynin (DITROPAN-XL) 10 MG 24 hr tablet Take 10 mg by mouth every morning. Reported on 05/13/2015    . OXYGEN 2 lpm with sleep    . polyethylene glycol (MIRALAX / GLYCOLAX) packet Take 17 g by mouth daily as needed for moderate constipation or severe constipation.     . traMADol (ULTRAM) 50 MG tablet Take 1-1/2 tablet every 4 hours as needed for severe cough and pain    . traZODone (DESYREL) 150 MG tablet Take 1 tablet (150 mg total) by mouth at bedtime. 30 tablet 0   No current facility-administered medications for this visit.     Allergies: No Known Allergies  Past Medical History, Surgical history, Social history, and Family History were reviewed and updated.    Physical Exam: Blood pressure 124/62, pulse 108, temperature 98.3 F (36.8 C), temperature source Oral, resp. rate 18, height 5' 11"  (1.803 m), weight 248 lb 12.8 oz (112.855 kg), SpO2 99 %. ECOG: 1 General appearance: Chronically ill-appearing gentleman without distress. Head: Normocephalic, without obvious abnormality. No oral ulcers or lesions. Neck: no adenopathy. No palpable masses. Lymph nodes: Cervical, supraclavicular, and axillary nodes normal. Heart:regular rate and rhythm, S1, S2 normal, no  murmur, click, rub or gallop Lung:chest clear, no wheezing, rales, normal symmetric air entry. Abdomen: soft, non-tender, without masses or organomegaly. No rebound or guarding. EXT:no erythema, induration, or nodules. Trace edema noted.  Skin: No rashes or lesions noted.    Lab Results: Lab Results  Component Value Date   WBC 1.1* 08/13/2015   HGB 7.9* 08/13/2015   HCT 23.3* 08/13/2015   MCV 96.3 08/13/2015   PLT 41* 08/13/2015     Chemistry      Component Value Date/Time   NA 136 07/08/2015 1520   NA 137 01/05/2013 1501   K 4.2 07/08/2015 1520   K 3.8 01/05/2013 1501   CL 96 07/08/2015 1520   CL 99 06/08/2012 1257   CO2 31 07/08/2015 1520   CO2 26 01/05/2013 1501   BUN 19 07/08/2015 1520   BUN 13.9 01/05/2013 1501   CREATININE 0.81 07/08/2015 1520   CREATININE 0.8 01/05/2013 1501      Component Value Date/Time   CALCIUM 10.1 07/08/2015 1520   CALCIUM  10.1 01/05/2013 1501   ALKPHOS 24* 07/01/2015 0630   ALKPHOS 48 08/10/2012 1117   AST 15 07/01/2015 0630   AST 17 08/10/2012 1117   ALT 20 07/01/2015 0630   ALT 14 08/10/2012 1117   BILITOT 0.5 07/01/2015 0630   BILITOT 0.82 08/10/2012 1117      Impression and Plan:  This is a 82 year old gentleman with the following issues:  1. Follicular lymphoma diagnosed in 2007. He continues to be in remission at this time. 2. Myelodysplastic syndrome. He is status post bone marrow biopsy was done on 06/22/2012. He is currently on supportive transfusion only due to the lack of response from Aranesp. He declined 5-azacytidine chemotherapy given the potential complications and his transfusion needs. The plan is to continue supportive transfusion and care. The goal is to keep his hemoglobin above 9. 3. Neutropenia: Neutropenic precautions were given to the patient today. No recent infections noted. 4. Monoclonal gammopathy.  His M-spike had been less than 1 g/dL, his bone marrow biopsy continued to show a less than 8% plasma cells  indicating most likely multiple myeloma.  Last SPEP was done in September 2016 and this will be repeated annually. 5. Thrombocytopenia: Likely related to his myelodysplasia without any bleeding. No need for transfusion of platelets at this time. 6. IV access: Port-A-Cath is in place without complications or bleeding. 7. Followup every 2 weeks for an injection and possible transfusion. He will have a clinical visit in one month.  [facility], MD 7/18/20179:30 AM

## 2015-08-13 NOTE — Patient Instructions (Signed)

## 2015-08-13 NOTE — Telephone Encounter (Signed)
Gave pt cal & avs °

## 2015-08-13 NOTE — Addendum Note (Signed)
Addended by: Randolm Idol on: 08/13/2015 09:46 AM   Modules accepted: Medications

## 2015-08-14 ENCOUNTER — Other Ambulatory Visit: Payer: Medicare Other

## 2015-08-14 LAB — TYPE AND SCREEN
ABO/RH(D): A POS
ANTIBODY SCREEN: NEGATIVE
UNIT DIVISION: 0
UNIT DIVISION: 0

## 2015-08-20 ENCOUNTER — Ambulatory Visit: Payer: Medicare Other | Admitting: Oncology

## 2015-08-20 ENCOUNTER — Other Ambulatory Visit: Payer: Medicare Other

## 2015-08-27 ENCOUNTER — Ambulatory Visit (HOSPITAL_COMMUNITY)
Admission: RE | Admit: 2015-08-27 | Discharge: 2015-08-27 | Disposition: A | Discharge status: Home / Self Care | Payer: Medicare Other | Source: Ambulatory Visit | Attending: Oncology | Admitting: Oncology

## 2015-08-27 ENCOUNTER — Other Ambulatory Visit: Payer: Self-pay | Admitting: Internal Medicine

## 2015-08-27 DIAGNOSIS — D649 Anemia, unspecified: Secondary | ICD-10-CM

## 2015-08-27 DIAGNOSIS — D469 Myelodysplastic syndrome, unspecified: Secondary | ICD-10-CM | POA: Insufficient documentation

## 2015-08-28 ENCOUNTER — Other Ambulatory Visit: Payer: Self-pay | Admitting: *Deleted

## 2015-08-28 ENCOUNTER — Ambulatory Visit (HOSPITAL_COMMUNITY)
Admission: RE | Admit: 2015-08-28 | Discharge: 2015-08-28 | Disposition: A | Discharge status: Home / Self Care | Payer: Medicare Other | Source: Ambulatory Visit | Attending: Oncology | Admitting: Oncology

## 2015-08-28 ENCOUNTER — Other Ambulatory Visit (HOSPITAL_BASED_OUTPATIENT_CLINIC_OR_DEPARTMENT_OTHER): Payer: Medicare Other

## 2015-08-28 ENCOUNTER — Ambulatory Visit: Payer: Medicare Other

## 2015-08-28 ENCOUNTER — Ambulatory Visit (HOSPITAL_BASED_OUTPATIENT_CLINIC_OR_DEPARTMENT_OTHER): Payer: Medicare Other

## 2015-08-28 DIAGNOSIS — D469 Myelodysplastic syndrome, unspecified: Secondary | ICD-10-CM

## 2015-08-28 DIAGNOSIS — D649 Anemia, unspecified: Secondary | ICD-10-CM

## 2015-08-28 DIAGNOSIS — Z95828 Presence of other vascular implants and grafts: Secondary | ICD-10-CM

## 2015-08-28 DIAGNOSIS — D61818 Other pancytopenia: Secondary | ICD-10-CM

## 2015-08-28 LAB — CBC WITH DIFFERENTIAL/PLATELET
BASO%: 0.2 % (ref 0.0–2.0)
BASOS ABS: 0 10*3/uL (ref 0.0–0.1)
EOS%: 0.6 % (ref 0.0–7.0)
Eosinophils Absolute: 0 10*3/uL (ref 0.0–0.5)
HEMATOCRIT: 22 % — AB (ref 38.4–49.9)
HGB: 7.3 g/dL — ABNORMAL LOW (ref 13.0–17.1)
LYMPH%: 40.3 % (ref 14.0–49.0)
MCH: 32.3 pg (ref 27.2–33.4)
MCHC: 33.4 g/dL (ref 32.0–36.0)
MCV: 96.8 fL (ref 79.3–98.0)
MONO#: 0 10*3/uL — ABNORMAL LOW (ref 0.1–0.9)
MONO%: 0.4 % (ref 0.0–14.0)
NEUT#: 0.6 10*3/uL — ABNORMAL LOW (ref 1.5–6.5)
NEUT%: 58.5 % (ref 39.0–75.0)
Platelets: 61 10*3/uL — ABNORMAL LOW (ref 140–400)
RBC: 2.27 10*6/uL — ABNORMAL LOW (ref 4.20–5.82)
RDW: 26.4 % — ABNORMAL HIGH (ref 11.0–14.6)
WBC: 1 10*3/uL — ABNORMAL LOW (ref 4.0–10.3)
lymph#: 0.4 10*3/uL — ABNORMAL LOW (ref 0.9–3.3)

## 2015-08-28 LAB — PREPARE RBC (CROSSMATCH)

## 2015-08-28 MED ORDER — SODIUM CHLORIDE 0.9% FLUSH
10.0000 mL | INTRAVENOUS | Status: AC | PRN
  Administered 2015-08-28: 10 mL
  Filled 2015-08-28: qty 10

## 2015-08-28 MED ORDER — SODIUM CHLORIDE 0.9 % IV SOLN
250.0000 mL | Freq: Once | INTRAVENOUS | Status: AC
  Administered 2015-08-28: 250 mL via INTRAVENOUS

## 2015-08-28 MED ORDER — HEPARIN SOD (PORK) LOCK FLUSH 100 UNIT/ML IV SOLN
500.0000 [IU] | Freq: Every day | INTRAVENOUS | Status: AC | PRN
  Administered 2015-08-28: 500 [IU]
  Filled 2015-08-28: qty 5

## 2015-08-28 MED ORDER — FUROSEMIDE 10 MG/ML IJ SOLN
INTRAMUSCULAR | Status: AC
Start: 2015-08-28 — End: 2015-08-28
  Filled 2015-08-28: qty 2

## 2015-08-28 MED ORDER — ACETAMINOPHEN 325 MG PO TABS: 650.0000 mg | ORAL_TABLET | Freq: Once | ORAL | Status: DC

## 2015-08-28 MED ORDER — SODIUM CHLORIDE 0.9 % IJ SOLN
10.0000 mL | INTRAMUSCULAR | Status: DC | PRN
  Administered 2015-08-28: 10 mL via INTRAVENOUS
  Filled 2015-08-28: qty 10

## 2015-08-28 MED ORDER — FUROSEMIDE 10 MG/ML IJ SOLN
20.0000 mg | Freq: Once | INTRAMUSCULAR | Status: AC
  Administered 2015-08-28: 20 mg via INTRAVENOUS

## 2015-08-28 NOTE — Patient Instructions (Addendum)
Blood Transfusion, Care After These instructions give you information about caring for yourself after your procedure. Your doctor may also give you more specific instructions. Call your doctor if you have any problems or questions after your procedure.  HOME CARE  Take medicines only as told by your doctor. Ask your doctor if you can take an over-the-counter pain reliever if you have a fever or headache a day or two after your procedure.  Return to your normal activities as told by your doctor. GET HELP IF:   You develop redness or irritation at your IV site.  You have a fever, chills, or a headache that does not go away.  Your pee (urine) is darker than normal.  Your urine turns:  Pink.  Red.  [place]ark.  The white part of your eye turns yellow (jaundice).  You feel weak after doing your normal activities. GET HELP RIGHT AWAY IF:   You have trouble breathing.  You have fever and chills and you also have:  Anxiety.  Chest or back pain.  Flushed or pink skin.  Clammy or sweaty skin.  A fast heartbeat.  A sick feeling in your stomach (nausea).  IF YOU HAVE ANY QUESTIONS CALL THE CLINIC AT [phone]   This information is not intended to replace advice given to you by your health care provider. Make sure you discuss any questions you have with your health care provider.   Document Released: 02/02/2014 Document Reviewed: 02/02/2014 Elsevier Interactive Patient Education [employer].

## 2015-08-29 LAB — TYPE AND SCREEN
ABO/RH(D): A POS
ANTIBODY SCREEN: NEGATIVE
UNIT DIVISION: 0
UNIT DIVISION: 0

## 2015-09-03 ENCOUNTER — Other Ambulatory Visit (INDEPENDENT_AMBULATORY_CARE_PROVIDER_SITE_OTHER): Payer: Medicare Other

## 2015-09-03 ENCOUNTER — Ambulatory Visit (INDEPENDENT_AMBULATORY_CARE_PROVIDER_SITE_OTHER)
Admission: RE | Admit: 2015-09-03 | Discharge: 2015-09-03 | Disposition: A | Discharge status: Home / Self Care | Payer: Medicare Other | Source: Ambulatory Visit | Attending: Internal Medicine | Admitting: Internal Medicine

## 2015-09-03 ENCOUNTER — Encounter: Payer: Self-pay | Admitting: Internal Medicine

## 2015-09-03 ENCOUNTER — Ambulatory Visit (INDEPENDENT_AMBULATORY_CARE_PROVIDER_SITE_OTHER): Payer: Medicare Other | Admitting: Internal Medicine

## 2015-09-03 ENCOUNTER — Ambulatory Visit: Payer: Medicare Other | Admitting: Internal Medicine

## 2015-09-03 VITALS — BP 124/72 | HR 110 | Temp 98.6°F | Ht 71.0 in | Wt 247.0 lb

## 2015-09-03 DIAGNOSIS — R0600 Dyspnea, unspecified: Secondary | ICD-10-CM

## 2015-09-03 DIAGNOSIS — D61818 Other pancytopenia: Secondary | ICD-10-CM | POA: Diagnosis not present

## 2015-09-03 DIAGNOSIS — R350 Frequency of micturition: Secondary | ICD-10-CM

## 2015-09-03 LAB — BRAIN NATRIURETIC PEPTIDE: PRO B NATRI PEPTIDE: 535 pg/mL — AB (ref 0.0–100.0)

## 2015-09-03 LAB — CBC WITH DIFFERENTIAL/PLATELET
BASOS ABS: 0 10*3/uL (ref 0.0–0.1)
Basophils Relative: 0.1 % (ref 0.0–3.0)
EOS ABS: 0 10*3/uL (ref 0.0–0.7)
EOS PCT: 0.7 % (ref 0.0–5.0)
HCT: 22.3 % — CL (ref 39.0–52.0)
Hemoglobin: 7.6 g/dL — CL (ref 13.0–17.0)
LYMPHS ABS: 0.2 10*3/uL — AB (ref 0.7–4.0)
Lymphocytes Relative: 25.4 % (ref 12.0–46.0)
MCHC: 34.2 g/dL (ref 30.0–36.0)
MCV: 95.2 fl (ref 78.0–100.0)
MONO ABS: 0 10*3/uL — AB (ref 0.1–1.0)
Monocytes Relative: 0.3 % — ABNORMAL LOW (ref 3.0–12.0)
NEUTROS PCT: 73.5 % (ref 43.0–77.0)
Neutro Abs: 0.7 10*3/uL — ABNORMAL LOW (ref 1.4–7.7)
Platelets: 58 10*3/uL — ABNORMAL LOW (ref 150.0–400.0)
RBC: 2.34 Mil/uL — AB (ref 4.22–5.81)
RDW: 24.1 % — ABNORMAL HIGH (ref 11.5–15.5)

## 2015-09-03 LAB — URINALYSIS
Bilirubin Urine: NEGATIVE
HGB URINE DIPSTICK: NEGATIVE
Ketones, ur: NEGATIVE
LEUKOCYTES UA: NEGATIVE
NITRITE: NEGATIVE
PH: 6 (ref 5.0–8.0)
SPECIFIC GRAVITY, URINE: 1.015 (ref 1.000–1.030)
Urine Glucose: NEGATIVE
Urobilinogen, UA: 0.2 (ref 0.0–1.0)

## 2015-09-03 LAB — TSH: TSH: 2.01 u[IU]/mL (ref 0.35–4.50)

## 2015-09-03 MED ORDER — CIPROFLOXACIN HCL 500 MG PO TABS: 500.0000 mg | ORAL_TABLET | Freq: Two times a day (BID) | ORAL | 0 refills | Status: DC

## 2015-09-03 NOTE — Patient Instructions (Addendum)
Please remember to go to the lab and x-ray department downstairs for your tests - we will call you with the results when they are available.  Cipro 500mg  twice daily x 7 days and be sure you have follow up with your urologist   Keep your scheduled appt with me in September - call sooner if needed Add:  Encouraged to use one extra  furosemide until feet swelling resolved  As per med cal instructions

## 2015-09-03 NOTE — Progress Notes (Signed)
Subjective:    Patient ID: [name], male    DOB: 11-30-1932  MRN: [id number]  History of Present Illness  82 year old white male, former smoker( Quit date 67), with mild obesity complicated by aodm, Lymphoma s/p chemo ([facility]) and tendency to peripheral edema that is felt to be dependent and related to obesity/ venous insuff.Here for Follow up hospital admission visit.   Admit date: 01/18/2015 Discharge date: 01/22/2015  Discharge Diagnoses:  Principal Problem:  Sepsis due to pneumonia [facility]) Active Problems:  Lobar pneumonia, unspecified organism ([place])  Lactic acidosis  Pancytopenia ([facility])  MDS (myelodysplastic syndrome) ([facility])  Neutropenia ([facility])  Thrombocytopenia ([facility])  Anemia of chronic disease  Pacemaker  Chronic diastolic CHF (congestive heart failure) ([place])     03/05/2015 Follow up : DCHF , Cough variant asthma , DM  Pt returns for a 3 week follow up .  Says overall he is doing better. Recovering from PNA.  Clinically is improving with improved energy.  Working with PT at home.  Now able to walk with cane.  Minimal cough and congestion . No fever or discolored mucus.  No hemoptysis .  No increased leg swelling or orthopnea  Needs updated med calendar list, reviewed with pt and daughter.  Would like a refill of xanax, uses it at night to help with sleep. On low dose  rec No changes  04/12/15 Dr [name] Script sent for doxycycline antibiotic Ok to take a probiotic with the antibiotic Script sent changing Dulera to Symbicort maintenance inhaler Script sent changing nasonex to flonase nasal spray     05/13/2015 extended  f/u ov/[name] re: sob /cough ? Asthma related  Chief Complaint  Patient presents with  . Follow-up    Breathing is progressively worse. He states he gets winded with very minimal exertion. Cough has not improved since the last visit. He still has occ wheeze.   cough is sporadic more often sitting in chair / and w/in 10 min  at hs x months with minimal mucoid sputum produced and no am excess  Breathing worse p change to symbicort from dulera but turns out at baseline using the neb bid, not prn as per med calendar  Doe = MMRC3 = can't walk 100 yards even at a slow pace at a flat grade s stopping due to sob  (not reproduced on walk test this ov) Much better after tx of prbc's vs prior. Using saba multiple forms hfa and neb rec Walk slower if getting short of breath with activity  Change trazadone 150 one whole at bedtime Restart dulera 200 2 every 12 hours  For drainage / throat tickle try take CHLORPHENIRAMINE  4 mg - take one every 4 hours as needed   Work on inhaler technique:   Plan A = Automatic = dulera 200 Take 2 puffs first thing in am and then another 2 puffs about 12 hours later  Plan B = Backup Only use your albuterol (proair) as a rescue medication. - Ok to use the inhaler up to 2 puffs  every 4 hours if you must but call for appointment if use goes up over your usual need - Don't leave home without it !!  (think of it like the spare tire for your car)  Plan C = Crisis/ Contringency - only use your albuterol nebulizer if you first try Plan B  Depomedrol 120 today     06/10/2015  Extended  f/u ov/[name] re: doe/ restriction on pfts (p  am dulera 200) Chief Complaint  Patient presents with  . Follow-up    Breathing is unchanged. No new co's today.   thinks maybe a little better sob p depomedrol 120 last ov but biggest change was with tx prbcs - using saba in hfa form only (never neb) around noon and 6 pm though supposed to be using dulera 200 2 when wakes and 12 h later per med calendar, very easily confused with details of care / can't seem to remember whether he's bothered by cough or not - could not find chlorpheniramine  >>depo medrol shot   07/08/2015 NP Follow up : Med calendar and Cough variant Asthma / MDS /D-CHF   admitted 06/30/15 for acute bronchitis, tx w/ abx. He has MDS with pancytopenia  requiring frequent blood  transfusions to keep Hbg >9.  He received 2 u PRBC during admission ,  Hbg at discharge last week was 8.8.  Pt brought all his meds in today , we reviewed them and organized them into a med calendar with pt education.  His daughter helps him with his meds. Appears to be taking correctly.  He remains sob and weak since discharge. They are worried hbg is getting worse.  He has follow up with Hematology next week.  No known bleeding . He does take sulindac for joint pain, advised to stop this .  Noticing that chronic leg swelling is worse lately. Advised to use extra lasix as previously instructed.  Has dry cough and DOE -chronic but worse for last month.  rec Stop Sulindac .  Labs today .  Follow with Dr. Alen [name] as planned next week.  Follow med calendar closely and bring to each visit.    09/03/2015 acute extended ov/[name] re: low grade fever/ multiple complaints new urinary incont on urology meds but no recent f/u  - 02 hs / dulear 200 2bid  Chief Complaint  Patient presents with  . Acute Visit    pt c/o non prod cough, right ear discomfort, low grade temp of 99.7, weakness, fatigue, wheezing, increase sob & dizziness X2wk  transfusion one week prior to [place] [facility] / sob/ saba did not   Overall worse x two weeks indolent onset/ min progressive sob not at rest  Not clear using action plan appropriately Noted urinary incontinence over last x sev weeks prior to OV  But no dysuria/ back pain   No obvious day to day or daytime variability or assoc excess/ purulent sputum or mucus plugs or hemoptysis or cp or chest tightness, subjective wheeze or overt sinus or hb symptoms. No unusual exp hx or h/o childhood pna/ asthma or knowledge of premature birth.  Sleeping ok without nocturnal  or early am exacerbation  of respiratory  c/o's or need for noct saba. Also denies any obvious fluctuation of symptoms with weather or environmental changes or other aggravating or  alleviating factors except as outlined above   Current Medications, Allergies, Complete Past Medical History, Past Surgical History, Family History, and Social History were reviewed in [employer] record.  ROS  The following are not active complaints unless bolded sore throat, dysphagia, dental problems, itching, sneezing,  nasal congestion or excess/ purulent secretions, ear ache,   fever, chills, sweats, unintended wt loss, classically pleuritic or exertional cp,  orthopnea pnd or leg swelling, presyncope, palpitations, abdominal pain, anorexia, nausea, vomiting, diarrhea  or change in bowel or bladder habits, change in stools or urine, dysuria,hematuria,  rash, arthralgias, visual complaints,  headache, numbness, weakness or ataxia or problems with walking or coordination,  change in mood/affect or memory.                      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 [place]  - MRI 11/23/2007 c/w L1-2 bulging disc indents the thecal sac with foraminal stenosis  HEALTH MAINTENANCE..........................................................................................Marland KitchenWert  - Td 10/2005 and 06/10/2015  - Pneumovax 10/07 and 10/2012 and Prevnar 06/14/13   -colon 02/2005 -int/extern. hems (repeat q7y)  Complex med regimen  --Meds reviewed with  pt education and computerized med calendar completed/adjusted. November 08, 2008 ,, 07/08/2015  August 21, 2009 updated 02/19/2011 , 10/23/2011 , 09/19/2013  Syncope...........................................................................................................Marland KitchenHochrein  - Mobitz II AV block s/p Medtronic pacemaker placement 12/11/2008  [facility].....................................................................................................[facility]  - Pruritic rash 04/2009 > tried lotrisone, [facility] August 06, 2009  Memory impairment , MMSE 27/30 8/25>refer to neuro   Family History:  mother had diabetes   Social History:  Patient states former smoker.  quit smoking in 1970  No ETOH  Retired       Objective:   Physical Exam    obese wm nad wc/ bound - very chronically but not acutely ill appearing   Wt Readings from Last 3 Encounters:  09/03/15 247 lb (112 kg)  08/13/15 248 lb 12.8 oz (112.9 kg)  07/17/15 242 lb 3.2 oz (109.9 kg)    Vital signs reviewed / note sats 94% RA   Physical Exam:  General- Elderly gentleman,  No distress,  A&Ox3,, pale  ENT: No sinus tenderness, TM clear, pale nasal mucosa, no oral exudate,no post nasal drip, no LAN/ ear canals  nl bilaterally  Cardiac: S1, S2, regular rate and rhythm, no murmur Chest: No wheeze/   no accessory muscle use, diminished bs in bases -faint BB crackles  Abd.: Soft Non-tender Ext: trace to 1+ pitting edema bilateral lower extremities Neuro:  Alert/ nl sensorium, no motor def Skin: No rashes, warm and dry Psych:appears to have  somewhat hopeless/helpless  affect   CXR PA and Lateral:   09/03/2015 :    I personally reviewed images and agree with radiology impression as follows:   The heart size and mediastinal contours are stable. Heart size is enlarged. Cardiac pacemaker is unchanged. A right central venous line is noted without change. There is interstitial pulmonary edema. There is  no pleural effusion or focal pneumonia. The visualized skeletal structures are stable.    Labs ordered/ reviewed:     Chemistry      Component Value Date/Time   NA 136 07/08/2015 1520   NA 137 01/05/2013 1501   K 4.2 07/08/2015 1520   K 3.8 01/05/2013 1501   CL 96 07/08/2015 1520   CL 99 06/08/2012 1257   CO2 31 07/08/2015 1520   CO2 26 01/05/2013 1501   BUN 19 07/08/2015 1520   BUN 13.9 01/05/2013 1501   CREATININE 0.81 07/08/2015 1520   CREATININE 0.8 01/05/2013 1501  Component Value Date/Time   CALCIUM 10.1 07/08/2015 1520   CALCIUM 10.1 01/05/2013 1501   ALKPHOS 24 (L) 07/01/2015 0630   ALKPHOS 48 08/10/2012 1117   AST 15 07/01/2015 0630   AST 17 08/10/2012 1117   ALT 20 07/01/2015 0630   ALT 14 08/10/2012 1117   BILITOT 0.5 07/01/2015 0630   BILITOT 0.82 08/10/2012 1117        Lab Results  Component Value Date   WBC 0.9 Repeated and verified X2. (LL) 09/03/2015   HGB 7.6 Repeated and verified X2. (LL) 09/03/2015   HCT 22.3 Repeated and verified X2. (LL) 09/03/2015   MCV 95.2 09/03/2015   PLT 58.0 Repeated and verified X2. (L) 09/03/2015      Lab Results  Component Value Date   TSH 2.01 09/03/2015     Lab Results  Component Value Date   PROBNP 535.0 (H) 09/03/2015

## 2015-09-04 ENCOUNTER — Encounter: Payer: Self-pay | Admitting: Internal Medicine

## 2015-09-04 NOTE — Assessment & Plan Note (Signed)
No evidence of uti but could have prostatitis assoc with urinary retention and low grade fever as don't have another explanation tying these two new complaints together :  rec cipro 500 mg bid x 7days and urology f/u

## 2015-09-04 NOTE — Assessment & Plan Note (Addendum)
05/13/2015   Walked RA  2 laps @ 185 ft each stopped due to  Sob/ sore legs relatively fast pace, sats 92% at lowest  - PFT's 06/07/15 c/w low ERV only   Studies c/w chf and anemia >> asthma as the cause of his sob, not using furosemide as per med cal:  One daily, one extra daily prn leg swelling   I had an extended discussion with the patient reviewing all relevant studies completed to date and  lasting 25/40 min extended acute evaluation of multiple chronic and new acute symtpoms  Each maintenance medication was reviewed in detail including most importantly the difference between maintenance and prns and under what circumstances the prns are to be triggered using an action plan format that is not reflected in the computer generated alphabetically organized AVS but trather by a customized med calendar that reflects the AVS meds with confirmed 100% correlation.   Please see instructions for details which were reviewed in writing and the patient given a copy highlighting the part that I personally wrote and discussed at today's ov.

## 2015-09-04 NOTE — Assessment & Plan Note (Signed)
?   Approaching endstage/ ? Need to consider hospice here > defer to heme/onc

## 2015-09-05 ENCOUNTER — Telehealth: Payer: Self-pay | Admitting: Internal Medicine

## 2015-09-05 NOTE — Telephone Encounter (Signed)
Spoke with pt's daughter and reviewed recommendations by MW for pt to increase Lasix by one tablet daily until edema is resolved and breathing improved. Advised daughter that if this takes more than 5 days to call and let us know. She voiced understanding. Nothing further needed.

## 2015-09-05 NOTE — Telephone Encounter (Signed)
Notes Recorded by Glean Hess, CMA on 09/04/2015 at 11:53 AM EDT Attempted to contact patient, left message for patient to return call.  ------ Notes Recorded by Tanda Rockers, MD on 09/03/2015 at 5:24 PM EDT Call patient : Studies are unremarkable, no change in recs   Notes Recorded by Glean Hess, CMA on 09/04/2015 at 11:55 AM EDT Attempted to contact patient, left message for patient to return call.  ------ Notes Recorded by Tanda Rockers, MD on 09/03/2015 at 5:24 PM EDT Call pt: Reviewed cxr and c/w too much lung water which correlates with legs swelling so take extra lasix daily until leg swelling gone and should help breathing too   Spoke with pt. He is aware of results. Nothing further was needed.

## 2015-09-09 ENCOUNTER — Inpatient Hospital Stay (HOSPITAL_COMMUNITY)
Admission: EM | Admit: 2015-09-09 | Discharge: 2015-09-13 | DRG: 194 | Disposition: A | Discharge status: Home Health | Payer: Medicare Other | Attending: Internal Medicine | Admitting: Internal Medicine

## 2015-09-09 ENCOUNTER — Encounter (HOSPITAL_COMMUNITY): Payer: Self-pay

## 2015-09-09 ENCOUNTER — Other Ambulatory Visit: Payer: Self-pay

## 2015-09-09 ENCOUNTER — Other Ambulatory Visit (HOSPITAL_COMMUNITY): Payer: Self-pay

## 2015-09-09 DIAGNOSIS — Z950 Presence of cardiac pacemaker: Secondary | ICD-10-CM

## 2015-09-09 DIAGNOSIS — Z7984 Long term (current) use of oral hypoglycemic drugs: Secondary | ICD-10-CM

## 2015-09-09 DIAGNOSIS — J189 Pneumonia, unspecified organism: Secondary | ICD-10-CM | POA: Diagnosis not present

## 2015-09-09 DIAGNOSIS — Z951 Presence of aortocoronary bypass graft: Secondary | ICD-10-CM

## 2015-09-09 DIAGNOSIS — D61818 Other pancytopenia: Secondary | ICD-10-CM | POA: Diagnosis present

## 2015-09-09 DIAGNOSIS — I5032 Chronic diastolic (congestive) heart failure: Secondary | ICD-10-CM | POA: Diagnosis present

## 2015-09-09 DIAGNOSIS — D469 Myelodysplastic syndrome, unspecified: Secondary | ICD-10-CM | POA: Diagnosis present

## 2015-09-09 DIAGNOSIS — E119 Type 2 diabetes mellitus without complications: Secondary | ICD-10-CM | POA: Diagnosis present

## 2015-09-09 DIAGNOSIS — Z66 Do not resuscitate: Secondary | ICD-10-CM | POA: Diagnosis present

## 2015-09-09 DIAGNOSIS — Z6834 Body mass index (BMI) 34.0-34.9, adult: Secondary | ICD-10-CM

## 2015-09-09 DIAGNOSIS — E039 Hypothyroidism, unspecified: Secondary | ICD-10-CM | POA: Diagnosis present

## 2015-09-09 DIAGNOSIS — D709 Neutropenia, unspecified: Secondary | ICD-10-CM

## 2015-09-09 DIAGNOSIS — C8590 Non-Hodgkin lymphoma, unspecified, unspecified site: Secondary | ICD-10-CM | POA: Diagnosis present

## 2015-09-09 DIAGNOSIS — Z9221 Personal history of antineoplastic chemotherapy: Secondary | ICD-10-CM

## 2015-09-09 DIAGNOSIS — Y95 Nosocomial condition: Secondary | ICD-10-CM | POA: Diagnosis present

## 2015-09-09 DIAGNOSIS — Z87891 Personal history of nicotine dependence: Secondary | ICD-10-CM

## 2015-09-09 DIAGNOSIS — I482 Chronic atrial fibrillation: Secondary | ICD-10-CM | POA: Diagnosis present

## 2015-09-09 DIAGNOSIS — W19XXXA Unspecified fall, initial encounter: Secondary | ICD-10-CM

## 2015-09-09 DIAGNOSIS — D649 Anemia, unspecified: Secondary | ICD-10-CM

## 2015-09-09 DIAGNOSIS — Z7951 Long term (current) use of inhaled steroids: Secondary | ICD-10-CM

## 2015-09-09 DIAGNOSIS — W06XXXA Fall from bed, initial encounter: Secondary | ICD-10-CM | POA: Diagnosis not present

## 2015-09-09 DIAGNOSIS — Y92230 Patient room in hospital as the place of occurrence of the external cause: Secondary | ICD-10-CM | POA: Diagnosis not present

## 2015-09-09 DIAGNOSIS — M25512 Pain in left shoulder: Secondary | ICD-10-CM | POA: Diagnosis not present

## 2015-09-09 DIAGNOSIS — E669 Obesity, unspecified: Secondary | ICD-10-CM | POA: Diagnosis present

## 2015-09-09 DIAGNOSIS — R531 Weakness: Secondary | ICD-10-CM | POA: Diagnosis not present

## 2015-09-09 DIAGNOSIS — I110 Hypertensive heart disease with heart failure: Secondary | ICD-10-CM | POA: Diagnosis present

## 2015-09-09 MED ORDER — SODIUM CHLORIDE 0.9 % IV BOLUS (SEPSIS)
1000.0000 mL | Freq: Once | INTRAVENOUS | Status: AC
  Administered 2015-09-10: 1000 mL via INTRAVENOUS

## 2015-09-09 NOTE — ED Notes (Signed)
Pt states that his daughter was helping him to the bathroom and he because weak and lowered himself to the floor, no fall, no injury, he states that he's been feeling weak for several days

## 2015-09-09 NOTE — ED Provider Notes (Signed)
[facility] DEPT Provider Note   CSN: [id number] Arrival date & time: 09/09/15  2327  By signing my name below, I, Reola Mosher, attest that this documentation has been prepared under the direction and in the presence of Everlene Balls, MD. Electronically Signed: Reola Mosher, ED Scribe. 09/09/15. 12:17 AM.  History   Chief Complaint Chief Complaint  Patient presents with  . Weakness   The history is provided by the patient and a relative. No language interpreter was used.   HPI Comments: [name] is a 82 y.o. male with a PMHx of lymphoma (s/p chemotherapy: [place], in remission ~10 years), anemia, thrombocytopenia, and pacemaker placement, who presents to the Emergency Department complaining of sudden onset, unchanged, constant weakness described as heaviness onset this afternoon today PTA. His daughter reports associated fevers at home over the past few days. He reports that he went to use the bathroom, and "couldn't even take anoteher step" on the way there. He states that while trying to walk he lost his footing, and fell to the ground. Family member at bedside witnessed the fall and denies LOC or head injury. His daughter additionally reports that he has been taking a burst of 500mg  Ciprofloxacin for the past 10 days, and today is his last day. Pt was placed on the burst from a recent visit to his PCP for urinary frequency. Prior chart review shows that a UA was performed at that time which was unremarkable. He denies pain anywhere, or any other associated symptoms.    Past Medical History:  Diagnosis Date  . Paroxysmal atrial fibrillation ([place]) 03/16/11   diagnosed by PPM interrogation  . Second degree Mobitz II AV block 12/11/08   with transient syncope, resolved s/p PPM   Patient Active Problem List   Diagnosis Date Noted  . Bronchitis 07/01/2015  . Chest pain 06/30/2015  . Port catheter in place 05/22/2015  . Dyspnea 05/13/2015  . Bronchitis,  chronic obstructive w acute bronchitis ([place]) 04/12/2015  . Cough variant asthma 03/05/2015  . Urinary frequency 02/12/2015  . Fungal infection 01/31/2015  . Sepsis due to pneumonia ([place]) 01/18/2015  . Lobar pneumonia, unspecified organism ([place]) 01/18/2015  . Neutropenia ([place]) 01/18/2015  . Thrombocytopenia ([place]) 01/18/2015  . Anemia of chronic disease 01/18/2015  . Pacemaker 01/18/2015  . Chronic diastolic CHF (congestive heart failure) ([place]) 01/18/2015  . MDS (myelodysplastic syndrome) ([place])   . Pancytopenia ([place]) 12/06/2012  . Lactic acidosis 10/28/2012   Past Surgical History:  Procedure Laterality Date  . BI-VENTRICULAR PACEMAKER UPGRADE N/A 12/29/2012   Procedure: BI-VENTRICULAR PACEMAKER UPGRADE;  Surgeon: Evans Lance, MD;  Location[facility] C[facility] LAB;  Service: Cardiovascular;  Laterality: N/A;  . INSERT / REPLACE / REMOVE PACEMAKER  12/29/2012  . LYMPH NODE BIOPSY  04/2005   groin  . PACEMAKER INSERTION  12/11/08   MDT implanted by Dr Rayann Heman  . PACEMAKER LEAD REMOVAL Left 11/04/2012   Procedure: PACEMAKER LEAD REMOVAL;  Surgeon: Evans Lance, MD;  Location: [facility];  Service: Cardiovascular;  Laterality: Left;  . PENILE PROSTHESIS IMPLANT     and removal  . PERICARDIECTOMY  2000  . STERNOTOMY  2000  . TEE WITHOUT CARDIOVERSION N/A 10/31/2012   Procedure: TRANSESOPHAGEAL ECHOCARDIOGRAM (TEE);  Surgeon: Lelon Perla, MD;  Location: [facility] ENDOSCOPY;  Service: Cardiovascular;  Laterality: N/A;  . TEMPORARY PACEMAKER INSERTION Left 11/04/2012   Procedure: TEMPORARY PACEMAKER INSERTION;  Surgeon: Evans Lance, MD;  Location: [facility];  Service: Cardiovascular;  Laterality:  Left;    Home Medications    Prior to Admission medications   Medication Sig Start Date End Date Taking? Authorizing Provider  albuterol (PROVENTIL HFA;VENTOLIN HFA) 108 (90 Base) MCG/ACT inhaler Inhale 2 puffs into the lungs every 4 (four) hours as needed for wheezing or shortness of breath.    Historical  Provider, MD  albuterol (PROVENTIL) (2.5 MG/3ML) 0.083% nebulizer solution Take 3 mLs (2.5 mg total) by nebulization every 4 (four) hours as needed for wheezing or shortness of breath. 01/29/15   Tanda Rockers, MD  ALPRAZolam Duanne Moron) 0.25 MG tablet Take 0.25 mg by mouth at bedtime as needed for anxiety or sleep.    Historical Provider, MD  calcium-vitamin D (OS-CAL 500 + D) 500-200 MG-UNIT per tablet Take 1 tablet by mouth 2 (two) times daily.     Historical Provider, MD  chlorpheniramine (CHLOR-TRIMETON) 4 MG tablet Take 4 mg by mouth every 4 (four) hours as needed (drippy nose).    Historical Provider, MD  ciprofloxacin (CIPRO) 500 MG tablet Take 1 tablet (500 mg total) by mouth 2 (two) times daily. 09/03/15   Tanda Rockers, MD  dextromethorphan-guaiFENesin [facility] DM) 30-600 MG 12hr tablet Take 1-2 tablets by mouth 2 (two) times daily as needed for cough.    Historical Provider, MD  docusate sodium (COLACE) 100 MG capsule Take 100 mg by mouth daily.     Historical Provider, MD  famotidine (PEPCID) 20 MG tablet Take 20 mg by mouth at bedtime.     Historical Provider, MD  finasteride (PROSCAR) 5 MG tablet TAKE 1 TABLET BY MOUTH EVERY MORNING 07/18/15   Tanda Rockers, MD  fluticasone [facility]) 50 MCG/ACT nasal spray 1-2 puffs each nostril once or twice daily for allergy 04/12/15   Deneise Lever, MD  furosemide (LASIX) 40 MG tablet TAKE 1 TABLET BY MOUTH EVERY DAY MAY TAKE 1 EXTRA TABLET DAILY IF NEEDED FOR SWELLING IN LEGS 04/24/15   Tanda Rockers, MD  hydrOXYzine (ATARAX/VISTARIL) 25 MG tablet Take 1-2 tabs by mouth every 4-6 hours as needed for itching    Historical Provider, MD  KLOR-CON M20 20 MEQ tablet Take 20 mEq by mouth daily. Reported on 07/16/2015 06/17/15   Historical Provider, MD  KLOR-CON M20 20 MEQ tablet TAKE 1 TABLET DAILY 07/18/15   Tanda Rockers, MD  levothyroxine (SYNTHROID, LEVOTHROID) 25 MCG tablet TAKE 1 TABLET BY MOUTH EVERY MORNING BEFORE BREAKFAST 02/18/15   Tanda Rockers, MD    metFORMIN (GLUCOPHAGE) 500 MG tablet TAKE 1 TABLET BY MOUTH TWICE A DAY WITH FOOD Patient taking differently: TAKE 1/2 TABLET BY MOUTH TWICE A DAY WITH FOOD 05/22/15   Tanda Rockers, MD  mometasone (NASONEX) 50 MCG/ACT nasal spray Place 2 sprays into the nose 2 (two) times daily.    Historical Provider, MD  mometasone-formoterol (DULERA) 200-5 MCG/ACT AERO Inhale 2 puffs into the lungs every 12 (twelve) hours.    Historical Provider, MD  Multiple Vitamin (MULTIVITAMIN) tablet Take 1 tablet by mouth every morning.     Historical Provider, MD  nitroGLYCERIN (NITROSTAT) 0.4 MG SL tablet Place 1 tablet (0.4 mg total) under the tongue every 5 (five) minutes as needed for chest pain. 06/04/14   Thompson Grayer, MD  omeprazole (PRILOSEC) 20 MG capsule Take 40 mg by mouth daily before breakfast.     Historical Provider, MD  oxybutynin (DITROPAN-XL) 10 MG 24 hr tablet Take 10 mg by mouth every morning. Reported on 05/13/2015    Historical  Provider, MD  OXYGEN 2 lpm with sleep    Historical Provider, MD  polyethylene glycol (MIRALAX / GLYCOLAX) packet Take 17 g by mouth daily as needed for moderate constipation or severe constipation.     Historical Provider, MD  traMADol (ULTRAM) 50 MG tablet Take 1-1/2 tablet every 4 hours as needed for severe cough and pain    Historical Provider, MD  traZODone (DESYREL) 150 MG tablet Take 1 tablet (150 mg total) by mouth at bedtime. 07/03/15   Bonnell Public, MD   Family History Family History  Problem Relation Age of Onset  . Diabetes Mother   . Liver cancer Brother   . Cancer Sister    Social History Social History  Substance Use Topics  . Smoking status: Former Smoker    Packs/day: 1.00    Years: 30.00    Types: Cigarettes    Quit date: 01/27/1968  . Smokeless tobacco: Former Systems developer    Types: Chew  . Alcohol use No   Allergies   Review of patient's allergies indicates no known allergies.  Review of Systems Review of Systems 10 Systems reviewed and all are  negative for acute change except as noted in the HPI.  Physical Exam Updated Vital Signs BP (!) 101/49   Pulse 82   Temp 98.6 F (37 C) (Rectal)   Resp 20   SpO2 98%   Physical Exam  Constitutional: He is oriented to person, place, and time. Vital signs are normal. He appears well-developed and well-nourished.  Non-toxic appearance. He does not appear ill. No distress.  HENT:  Head: Normocephalic and atraumatic.  Nose: Nose normal.  Mouth/Throat: Oropharynx is clear and moist. No oropharyngeal exudate.  O2 Nasal canual in place.  Eyes: Conjunctivae and EOM are normal. Pupils are equal, round, and reactive to light. No scleral icterus.  Neck: Normal range of motion. Neck supple. No tracheal deviation, no edema, no erythema and normal range of motion present. No thyroid mass and no thyromegaly present.  Cardiovascular: Normal rate, regular rhythm, S1 normal, S2 normal, normal heart sounds, intact distal pulses and normal pulses.  Exam reveals no gallop and no friction rub.   No murmur heard. Pulmonary/Chest: Tachypnea noted. No respiratory distress. He has wheezes in the left lower field. He has no rhonchi. He has no rales.  Tachypnea noted, w/ increased work of breathing on exam.  Abdominal: Soft. Normal appearance and bowel sounds are normal. He exhibits no distension, no ascites and no mass. There is no hepatosplenomegaly. There is no tenderness. There is no rebound, no guarding and no CVA tenderness.  Musculoskeletal: Normal range of motion. He exhibits edema. He exhibits no tenderness.  Bilateral lower extremity edema noted on exam.  Lymphadenopathy:    He has no cervical adenopathy.  Neurological: He is alert and oriented to person, place, and time. He has normal strength. No cranial nerve deficit or sensory deficit.  Skin: Skin is warm, dry and intact. No petechiae and no rash noted. He is not diaphoretic. No erythema. No pallor.  Tactile fever noted on exam.  Nursing note and  vitals reviewed.  ED Treatments / Results  DIAGNOSTIC STUDIES: Oxygen Saturation is 100% on O2 NL, normal by my interpretation.   COORDINATION OF CARE: 12:11 AM-Discussed next steps with pt. Pt verbalized understanding and is agreeable with the plan.   Labs (all labs ordered are listed, but only abnormal results are displayed) Labs Reviewed  CBC WITH DIFFERENTIAL/PLATELET - Abnormal; Notable for the following:  Result Value   WBC 0.8 (*)    RBC 1.92 (*)    Hemoglobin 6.3 (*)    HCT 18.6 (*)    RDW 22.1 (*)    Platelets 49 (*)    nRBC 1 (*)    Neutro Abs 0.5 (*)    Lymphs Abs 0.3 (*)    Monocytes Absolute 0.0 (*)    All other components within normal limits  COMPREHENSIVE METABOLIC PANEL - Abnormal; Notable for the following:    Sodium 132 (*)    Chloride 97 (*)    Glucose, Bld 145 (*)    Total Protein 6.2 (*)    Albumin 2.9 (*)    All other components within normal limits  MAGNESIUM - Abnormal; Notable for the following:    Magnesium 1.5 (*)    All other components within normal limits  BRAIN NATRIURETIC PEPTIDE - Abnormal; Notable for the following:    B Natriuretic Peptide 253.7 (*)    All other components within normal limits  I-STAT CG4 LACTIC ACID, ED - Abnormal; Notable for the following:    Lactic Acid, Venous 1.95 (*)    All other components within normal limits  I-STAT CHEM 8, ED - Abnormal; Notable for the following:    Sodium 133 (*)    Chloride 93 (*)    Glucose, Bld 144 (*)    Hemoglobin 6.5 (*)    HCT 19.0 (*)    All other components within normal limits  URINE CULTURE  CULTURE, BLOOD (ROUTINE X 2)  CULTURE, BLOOD (ROUTINE X 2)  LIPASE, BLOOD  URINALYSIS, ROUTINE W REFLEX MICROSCOPIC (NOT AT [facility])  I-STAT TROPOININ, ED  TYPE AND SCREEN    EKG  EKG Interpretation None      Radiology Dg Chest 2 View  Result Date: 09/10/2015 CLINICAL DATA:  82 y/o M; fever for 48 hours, weakness tonight, shortness of breath, and chest heaviness.  EXAM: CHEST  2 VIEW COMPARISON:  Chest radiograph dated 09/03/2015. FINDINGS: Patchy opacities throughout the lungs bilaterally. Two lead pacemaker. Sternotomy wires are aligned and intact. Right central venous catheter with tip at the cavoatrial junction unchanged. No pneumothorax. No definite pleural effusion. Stable cardiomegaly given differences in technique. IMPRESSION: Diffuse patchy pulmonary opacities probably represents moderate pulmonary edema. Underlying pneumonia is not excluded. Stable cardiomegaly. Electronically Signed   By: Kristine Garbe M.D.   On: 09/10/2015 00:37   Ct Chest Wo Contrast  Result Date: 09/10/2015 CLINICAL DATA:  82 y/o M; weakness since this afternoon. Reported fevers for the last few days. History of lymphoma status post chemotherapy. EXAM: CT CHEST WITHOUT CONTRAST TECHNIQUE: Multidetector CT imaging of the chest was performed following the standard protocol without IV contrast. COMPARISON:  CT angiogram of the chest dated 10/28/2012. FINDINGS: Cardiovascular: Postsurgical changes related to coronary artery bypass graft surgery. Mild cardiomegaly. The ascending and wires noted. Moderate coronary artery calcifications. Aortic valvular and mitral annular calcifications. Calcific atherosclerosis of the thoracic aorta. No aortic aneurysm. Mediastinum/Nodes: Prominent hilar and AP window lymph nodes are likely reactive. Lungs/Pleura: There are consolidations in the left lower and bilateral upper lobes. Additionally there is a background of diffuse interlobular septal thickening. Trace left-sided pleural effusion. Upper Abdomen: Mild calcific aortic atherosclerosis. Lucency in the liver caudate lobe measuring 13 mm (2:127) and subcentimeter lucency in segment II are likely cysts. Moderate-sized hiatal hernia. Otherwise the upper abdomen is unremarkable. Musculoskeletal: Healed median sternotomy. No acute osseous abnormality is identified. IMPRESSION: 1. Consolidations in  the left lower  lobe and bilateral upper lobes probably represents multifocal pneumonia. 2. Background of septal thickening and diffuse hazy opacification is likely to represent an additional component of mild pulmonary edema. These results were called by telephone at the time of interpretation on 09/10/2015 at 2:18 am to Dr. Everlene Balls , who verbally acknowledged these results. Electronically Signed   By: Kristine Garbe M.D.   On: 09/10/2015 02:20    Procedures Procedures (including critical care time)  Medications Ordered in ED Medications  vancomycin (VANCOCIN) 1,500 mg in sodium chloride 0.9 % 500 mL IVPB (1,500 mg Intravenous New Bag/Given 09/10/15 0229)  magnesium sulfate IVPB 2 g 50 mL (not administered)  sodium chloride 0.9 % bolus 1,000 mL (1,000 mLs Intravenous New Bag/Given 09/10/15 0040)  ceFEPIme (MAXIPIME) 2 g in dextrose 5 % 50 mL IVPB (0 g Intravenous Stopped 09/10/15 0236)    Initial Impression / Assessment and Plan / ED Course  I have reviewed the triage vital signs and the nursing notes.  Pertinent labs & imaging results that were available during my care of the patient were reviewed by me and considered in my medical decision making (see chart for details).  Clinical Course    Patient presents to the ED for weakness.  He feels warm on exam, I have concern for infection.  In addition, he has been on cipro for "possible UTI".  Will obtain labs, UA, and imaging for further evaluation.  2:36 AM CXR was equivocal and shows fluid overload with possible underlying PNA.  I obtained CT scan because I was highly suspicious for pneumonia, this reveals multifocal pneumonia with fluid overload.  Blood cultures drawn, patient given vanc and cefepime.  Magnesium replaced.  I spoke with Dr. Hal Hope who will admit to tele for further care.  Final Clinical Impressions(s) / ED Diagnoses   Final diagnoses:  None    New Prescriptions New Prescriptions   No medications on  file      I personally performed the services described in this documentation, which was scribed in my presence. The recorded information has been reviewed and is accurate.       Everlene Balls, MD 09/10/15 [phone]

## 2015-09-09 NOTE — ED Notes (Signed)
Bed: [id number] Expected date:  Expected time:  Means of arrival:  Comments: 82 yr old, fall cancer pt

## 2015-09-10 ENCOUNTER — Encounter (HOSPITAL_COMMUNITY): Payer: Self-pay | Admitting: Internal Medicine

## 2015-09-10 ENCOUNTER — Emergency Department (HOSPITAL_COMMUNITY): Payer: Medicare Other

## 2015-09-10 DIAGNOSIS — Z950 Presence of cardiac pacemaker: Secondary | ICD-10-CM | POA: Diagnosis not present

## 2015-09-10 DIAGNOSIS — E119 Type 2 diabetes mellitus without complications: Secondary | ICD-10-CM | POA: Diagnosis present

## 2015-09-10 DIAGNOSIS — Y92230 Patient room in hospital as the place of occurrence of the external cause: Secondary | ICD-10-CM | POA: Diagnosis not present

## 2015-09-10 DIAGNOSIS — D469 Myelodysplastic syndrome, unspecified: Secondary | ICD-10-CM | POA: Diagnosis present

## 2015-09-10 DIAGNOSIS — Z951 Presence of aortocoronary bypass graft: Secondary | ICD-10-CM | POA: Diagnosis not present

## 2015-09-10 DIAGNOSIS — E669 Obesity, unspecified: Secondary | ICD-10-CM | POA: Diagnosis present

## 2015-09-10 DIAGNOSIS — J189 Pneumonia, unspecified organism: Secondary | ICD-10-CM | POA: Diagnosis present

## 2015-09-10 DIAGNOSIS — Y95 Nosocomial condition: Secondary | ICD-10-CM | POA: Diagnosis present

## 2015-09-10 DIAGNOSIS — Z87891 Personal history of nicotine dependence: Secondary | ICD-10-CM | POA: Diagnosis not present

## 2015-09-10 DIAGNOSIS — Z66 Do not resuscitate: Secondary | ICD-10-CM | POA: Diagnosis present

## 2015-09-10 DIAGNOSIS — D61818 Other pancytopenia: Secondary | ICD-10-CM | POA: Diagnosis present

## 2015-09-10 DIAGNOSIS — I482 Chronic atrial fibrillation: Secondary | ICD-10-CM | POA: Diagnosis present

## 2015-09-10 DIAGNOSIS — D649 Anemia, unspecified: Secondary | ICD-10-CM | POA: Diagnosis not present

## 2015-09-10 DIAGNOSIS — I5032 Chronic diastolic (congestive) heart failure: Secondary | ICD-10-CM | POA: Diagnosis present

## 2015-09-10 DIAGNOSIS — C8590 Non-Hodgkin lymphoma, unspecified, unspecified site: Secondary | ICD-10-CM | POA: Diagnosis present

## 2015-09-10 DIAGNOSIS — R531 Weakness: Secondary | ICD-10-CM | POA: Diagnosis present

## 2015-09-10 DIAGNOSIS — I110 Hypertensive heart disease with heart failure: Secondary | ICD-10-CM | POA: Diagnosis present

## 2015-09-10 DIAGNOSIS — M25512 Pain in left shoulder: Secondary | ICD-10-CM | POA: Diagnosis not present

## 2015-09-10 DIAGNOSIS — Z9221 Personal history of antineoplastic chemotherapy: Secondary | ICD-10-CM | POA: Diagnosis not present

## 2015-09-10 DIAGNOSIS — Z6834 Body mass index (BMI) 34.0-34.9, adult: Secondary | ICD-10-CM | POA: Diagnosis not present

## 2015-09-10 DIAGNOSIS — W06XXXA Fall from bed, initial encounter: Secondary | ICD-10-CM | POA: Diagnosis not present

## 2015-09-10 DIAGNOSIS — Z7984 Long term (current) use of oral hypoglycemic drugs: Secondary | ICD-10-CM | POA: Diagnosis not present

## 2015-09-10 DIAGNOSIS — E039 Hypothyroidism, unspecified: Secondary | ICD-10-CM | POA: Diagnosis present

## 2015-09-10 DIAGNOSIS — Z7951 Long term (current) use of inhaled steroids: Secondary | ICD-10-CM | POA: Diagnosis not present

## 2015-09-10 LAB — I-STAT CHEM 8, ED
BUN: 14 mg/dL (ref 6–20)
CALCIUM ION: 1.23 mmol/L (ref 1.12–1.23)
CREATININE: 0.7 mg/dL (ref 0.61–1.24)
Chloride: 93 mmol/L — ABNORMAL LOW (ref 101–111)
Glucose, Bld: 144 mg/dL — ABNORMAL HIGH (ref 65–99)
HCT: 19 % — ABNORMAL LOW (ref 39.0–52.0)
Hemoglobin: 6.5 g/dL — CL (ref 13.0–17.0)
Potassium: 4 mmol/L (ref 3.5–5.1)
Sodium: 133 mmol/L — ABNORMAL LOW (ref 135–145)
TCO2: 25 mmol/L (ref 0–100)

## 2015-09-10 LAB — I-STAT TROPONIN, ED: TROPONIN I, POC: 0.02 ng/mL (ref 0.00–0.08)

## 2015-09-10 LAB — CBC WITH DIFFERENTIAL/PLATELET
BASOS PCT: 0 %
Basophils Absolute: 0 10*3/uL (ref 0.0–0.1)
Eosinophils Absolute: 0 10*3/uL (ref 0.0–0.7)
Eosinophils Relative: 0 %
HEMATOCRIT: 18.6 % — AB (ref 39.0–52.0)
HEMOGLOBIN: 6.3 g/dL — AB (ref 13.0–17.0)
LYMPHS ABS: 0.3 10*3/uL — AB (ref 0.7–4.0)
Lymphocytes Relative: 39 %
MCH: 32.8 pg (ref 26.0–34.0)
MCHC: 33.9 g/dL (ref 30.0–36.0)
MCV: 96.9 fL (ref 78.0–100.0)
MONO ABS: 0 10*3/uL — AB (ref 0.1–1.0)
Monocytes Relative: 0 %
NRBC: 1 /100{WBCs} — AB
Neutro Abs: 0.5 10*3/uL — ABNORMAL LOW (ref 1.7–7.7)
Neutrophils Relative %: 61 %
Platelets: 49 10*3/uL — ABNORMAL LOW (ref 150–400)
RBC: 1.92 MIL/uL — ABNORMAL LOW (ref 4.22–5.81)
RDW: 22.1 % — AB (ref 11.5–15.5)
WBC: 0.8 10*3/uL — CL (ref 4.0–10.5)

## 2015-09-10 LAB — MRSA PCR SCREENING: MRSA by PCR: NEGATIVE

## 2015-09-10 LAB — URINALYSIS, ROUTINE W REFLEX MICROSCOPIC
BILIRUBIN URINE: NEGATIVE
GLUCOSE, UA: NEGATIVE mg/dL
HGB URINE DIPSTICK: NEGATIVE
KETONES UR: NEGATIVE mg/dL
LEUKOCYTES UA: NEGATIVE
Nitrite: NEGATIVE
PH: 7 (ref 5.0–8.0)
PROTEIN: NEGATIVE mg/dL
Specific Gravity, Urine: 1.011 (ref 1.005–1.030)

## 2015-09-10 LAB — LACTIC ACID, PLASMA: LACTIC ACID, VENOUS: 2 mmol/L — AB (ref 0.5–1.9)

## 2015-09-10 LAB — COMPREHENSIVE METABOLIC PANEL
ALK PHOS: 81 U/L (ref 38–126)
ALT: 46 U/L (ref 17–63)
AST: 31 U/L (ref 15–41)
Albumin: 2.9 g/dL — ABNORMAL LOW (ref 3.5–5.0)
Anion gap: 8 (ref 5–15)
BILIRUBIN TOTAL: 0.7 mg/dL (ref 0.3–1.2)
BUN: 17 mg/dL (ref 6–20)
CALCIUM: 9.2 mg/dL (ref 8.9–10.3)
CO2: 27 mmol/L (ref 22–32)
CREATININE: 0.69 mg/dL (ref 0.61–1.24)
Chloride: 97 mmol/L — ABNORMAL LOW (ref 101–111)
GFR calc Af Amer: >60 mL/min (ref >60)
Glucose, Bld: 145 mg/dL — ABNORMAL HIGH (ref 65–99)
POTASSIUM: 3.9 mmol/L (ref 3.5–5.1)
Sodium: 132 mmol/L — ABNORMAL LOW (ref 135–145)
TOTAL PROTEIN: 6.2 g/dL — AB (ref 6.5–8.1)

## 2015-09-10 LAB — BRAIN NATRIURETIC PEPTIDE: B Natriuretic Peptide: 253.7 pg/mL — ABNORMAL HIGH (ref 0.0–100.0)

## 2015-09-10 LAB — LIPASE, BLOOD: LIPASE: 17 U/L (ref 11–51)

## 2015-09-10 LAB — HIV ANTIBODY (ROUTINE TESTING W REFLEX): HIV SCREEN 4TH GENERATION: NONREACTIVE

## 2015-09-10 LAB — STREP PNEUMONIAE URINARY ANTIGEN: Strep Pneumo Urinary Antigen: NEGATIVE

## 2015-09-10 LAB — MAGNESIUM
Magnesium: 1.5 mg/dL — ABNORMAL LOW (ref 1.7–2.4)
Magnesium: 1.9 mg/dL (ref 1.7–2.4)

## 2015-09-10 LAB — PREPARE RBC (CROSSMATCH)

## 2015-09-10 LAB — I-STAT CG4 LACTIC ACID, ED: LACTIC ACID, VENOUS: 1.95 mmol/L — AB (ref 0.5–1.9)

## 2015-09-10 MED ORDER — ACETAMINOPHEN 650 MG RE SUPP
650.0000 mg | Freq: Four times a day (QID) | RECTAL | Status: DC | PRN
Start: 2015-09-10 — End: 2015-09-13

## 2015-09-10 MED ORDER — CEFEPIME HCL 2 G IJ SOLR
2.0000 g | Freq: Once | INTRAMUSCULAR | Status: AC
  Administered 2015-09-10: 2 g via INTRAVENOUS
  Filled 2015-09-10: qty 2

## 2015-09-10 MED ORDER — DEXTROSE 5 % IV SOLN
2.0000 g | Freq: Three times a day (TID) | INTRAVENOUS | Status: DC
  Administered 2015-09-10 – 2015-09-12 (×7): 2 g via INTRAVENOUS
  Filled 2015-09-10 (×7): qty 2

## 2015-09-10 MED ORDER — CHLORHEXIDINE GLUCONATE 0.12 % MT SOLN
15.0000 mL | Freq: Two times a day (BID) | OROMUCOSAL | Status: DC
  Administered 2015-09-10 – 2015-09-13 (×7): 15 mL via OROMUCOSAL
  Filled 2015-09-10 (×7): qty 15

## 2015-09-10 MED ORDER — NITROGLYCERIN 0.4 MG SL SUBL: 0.4000 mg | SUBLINGUAL_TABLET | SUBLINGUAL | Status: DC | PRN

## 2015-09-10 MED ORDER — FLUTICASONE PROPIONATE 50 MCG/ACT NA SUSP
2.0000 | Freq: Every day | NASAL | Status: DC
  Administered 2015-09-10 – 2015-09-13 (×4): 2 via NASAL
  Filled 2015-09-10: qty 16

## 2015-09-10 MED ORDER — DM-GUAIFENESIN ER 30-600 MG PO TB12: 1.0000 | ORAL_TABLET | Freq: Two times a day (BID) | ORAL | Status: DC | PRN

## 2015-09-10 MED ORDER — DOCUSATE SODIUM 100 MG PO CAPS: 100.0000 mg | ORAL_CAPSULE | Freq: Every day | ORAL | Status: DC | PRN

## 2015-09-10 MED ORDER — ONDANSETRON HCL 4 MG PO TABS: 4.0000 mg | ORAL_TABLET | Freq: Four times a day (QID) | ORAL | Status: DC | PRN

## 2015-09-10 MED ORDER — PANTOPRAZOLE SODIUM 40 MG PO TBEC
40.0000 mg | DELAYED_RELEASE_TABLET | Freq: Every day | ORAL | Status: DC
  Administered 2015-09-10 – 2015-09-13 (×4): 40 mg via ORAL
  Filled 2015-09-10 (×4): qty 1

## 2015-09-10 MED ORDER — ALBUTEROL SULFATE (2.5 MG/3ML) 0.083% IN NEBU: 2.5000 mg | INHALATION_SOLUTION | RESPIRATORY_TRACT | Status: DC | PRN

## 2015-09-10 MED ORDER — DEXTROSE 5 % IV SOLN
1.0000 g | Freq: Three times a day (TID) | INTRAVENOUS | Status: DC
  Filled 2015-09-10: qty 1

## 2015-09-10 MED ORDER — MOMETASONE FURO-FORMOTEROL FUM 200-5 MCG/ACT IN AERO
2.0000 | INHALATION_SPRAY | Freq: Two times a day (BID) | RESPIRATORY_TRACT | Status: DC
  Administered 2015-09-10 – 2015-09-13 (×7): 2 via RESPIRATORY_TRACT
  Filled 2015-09-10: qty 8.8

## 2015-09-10 MED ORDER — ACETAMINOPHEN 325 MG PO TABS
650.0000 mg | ORAL_TABLET | Freq: Four times a day (QID) | ORAL | Status: DC | PRN
  Administered 2015-09-12: 650 mg via ORAL
  Filled 2015-09-10: qty 2

## 2015-09-10 MED ORDER — FAMOTIDINE 20 MG PO TABS
20.0000 mg | ORAL_TABLET | Freq: Every day | ORAL | Status: DC
  Administered 2015-09-10 – 2015-09-12 (×3): 20 mg via ORAL
  Filled 2015-09-10 (×3): qty 1

## 2015-09-10 MED ORDER — DIPHENHYDRAMINE HCL 25 MG PO CAPS
25.0000 mg | ORAL_CAPSULE | Freq: Four times a day (QID) | ORAL | Status: DC
  Administered 2015-09-10 (×2): 25 mg via ORAL
  Filled 2015-09-10 (×3): qty 1

## 2015-09-10 MED ORDER — OXYBUTYNIN CHLORIDE ER 10 MG PO TB24
10.0000 mg | ORAL_TABLET | Freq: Every day | ORAL | Status: DC
  Administered 2015-09-10 – 2015-09-13 (×4): 10 mg via ORAL
  Filled 2015-09-10 (×4): qty 1

## 2015-09-10 MED ORDER — TRAMADOL HCL 50 MG PO TABS: 50.0000 mg | ORAL_TABLET | Freq: Four times a day (QID) | ORAL | Status: DC | PRN

## 2015-09-10 MED ORDER — TRAZODONE HCL 50 MG PO TABS
150.0000 mg | ORAL_TABLET | Freq: Every day | ORAL | Status: DC
  Administered 2015-09-10 – 2015-09-12 (×3): 150 mg via ORAL
  Filled 2015-09-10 (×3): qty 3

## 2015-09-10 MED ORDER — POLYETHYLENE GLYCOL 3350 17 G PO PACK: 17.0000 g | PACK | Freq: Every day | ORAL | Status: DC | PRN

## 2015-09-10 MED ORDER — FUROSEMIDE 10 MG/ML IJ SOLN
60.0000 mg | Freq: Once | INTRAMUSCULAR | Status: AC
  Administered 2015-09-10: 60 mg via INTRAVENOUS
  Filled 2015-09-10: qty 6

## 2015-09-10 MED ORDER — MAGNESIUM SULFATE 2 GM/50ML IV SOLN
2.0000 g | Freq: Once | INTRAVENOUS | Status: AC
  Administered 2015-09-10: 2 g via INTRAVENOUS
  Filled 2015-09-10: qty 50

## 2015-09-10 MED ORDER — ADULT MULTIVITAMIN W/MINERALS CH
1.0000 | ORAL_TABLET | Freq: Every day | ORAL | Status: DC
  Administered 2015-09-10 – 2015-09-13 (×4): 1 via ORAL
  Filled 2015-09-10 (×4): qty 1

## 2015-09-10 MED ORDER — ONDANSETRON HCL 4 MG/2ML IJ SOLN: 4.0000 mg | Freq: Four times a day (QID) | INTRAMUSCULAR | Status: DC | PRN

## 2015-09-10 MED ORDER — FINASTERIDE 5 MG PO TABS
5.0000 mg | ORAL_TABLET | Freq: Every morning | ORAL | Status: DC
  Administered 2015-09-10 – 2015-09-13 (×4): 5 mg via ORAL
  Filled 2015-09-10 (×4): qty 1

## 2015-09-10 MED ORDER — POTASSIUM CHLORIDE CRYS ER 20 MEQ PO TBCR
20.0000 meq | EXTENDED_RELEASE_TABLET | Freq: Every day | ORAL | Status: DC
  Administered 2015-09-10 – 2015-09-13 (×4): 20 meq via ORAL
  Filled 2015-09-10 (×4): qty 1

## 2015-09-10 MED ORDER — VANCOMYCIN HCL IN DEXTROSE 750-5 MG/150ML-% IV SOLN
750.0000 mg | Freq: Two times a day (BID) | INTRAVENOUS | Status: DC
  Administered 2015-09-10 – 2015-09-12 (×4): 750 mg via INTRAVENOUS
  Filled 2015-09-10 (×5): qty 150

## 2015-09-10 MED ORDER — FUROSEMIDE 40 MG PO TABS
40.0000 mg | ORAL_TABLET | Freq: Every day | ORAL | Status: DC
  Administered 2015-09-11 – 2015-09-13 (×3): 40 mg via ORAL
  Filled 2015-09-10 (×3): qty 1

## 2015-09-10 MED ORDER — CETYLPYRIDINIUM CHLORIDE 0.05 % MT LIQD
7.0000 mL | Freq: Two times a day (BID) | OROMUCOSAL | Status: DC
  Administered 2015-09-10 – 2015-09-13 (×6): 7 mL via OROMUCOSAL

## 2015-09-10 MED ORDER — HYDROXYZINE HCL 25 MG PO TABS: 25.0000 mg | ORAL_TABLET | Freq: Three times a day (TID) | ORAL | Status: DC | PRN

## 2015-09-10 MED ORDER — CALCIUM CARBONATE-VITAMIN D 500-200 MG-UNIT PO TABS
1.0000 | ORAL_TABLET | Freq: Two times a day (BID) | ORAL | Status: DC
  Administered 2015-09-10 – 2015-09-13 (×7): 1 via ORAL
  Filled 2015-09-10 (×7): qty 1

## 2015-09-10 MED ORDER — ALBUTEROL SULFATE HFA 108 (90 BASE) MCG/ACT IN AERS: 2.0000 | INHALATION_SPRAY | RESPIRATORY_TRACT | Status: DC | PRN

## 2015-09-10 MED ORDER — SODIUM CHLORIDE 0.9 % IV SOLN: Freq: Once | INTRAVENOUS | Status: DC

## 2015-09-10 MED ORDER — LEVOTHYROXINE SODIUM 25 MCG PO TABS
25.0000 ug | ORAL_TABLET | Freq: Every day | ORAL | Status: DC
  Administered 2015-09-10 – 2015-09-13 (×4): 25 ug via ORAL
  Filled 2015-09-10 (×4): qty 1

## 2015-09-10 MED ORDER — VANCOMYCIN HCL 10 G IV SOLR
1500.0000 mg | Freq: Once | INTRAVENOUS | Status: AC
  Administered 2015-09-10: 1500 mg via INTRAVENOUS
  Filled 2015-09-10: qty 1500

## 2015-09-10 MED ORDER — ALPRAZOLAM 0.25 MG PO TABS
0.2500 mg | ORAL_TABLET | Freq: Every evening | ORAL | Status: DC | PRN
Start: 2015-09-10 — End: 2015-09-13

## 2015-09-10 NOTE — Progress Notes (Signed)
PROGRESS NOTE                                                                                                                                                                                                             Patient Demographics:    [name], is a 82 y.o. male, DOB - 12-25-1932, KT:[id number]  Admit date - 09/09/2015   Admitting Physician Rise Patience, MD  Outpatient Primary MD for the patient is Christinia Gully, MD  LOS - 0  Outpatient Specialists:  Chief Complaint  Patient presents with  . 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 fib not an adequate ideation recently treated with ciprofloxacin for UTI was brought to the ED as he was feeling very weak for past 2 weeks and had a near-syncopal episode. In the ED his hemoglobin was 6. Chest x-ray showed edema versus infiltrate. CT chest done showed multifocal pneumonia.   Subjective:   Patient reports nonproductive cough and weakness.   Assessment  & Plan :    Principal Problem:   HCAP (healthcare-associated pneumonia) Empiric Vanco and cefepime. Follow cultures. Supportive care with Tylenol and antitussives.  Active Problems:   Generalized weakness with Symptomatic anemia Secondary to myelodysplasia. Receives transfusion as outpatient. Order 2 units PRBC transfusion. IV Lasix in between transfusion. Monitor H&H. Seen by PT and recommend follow-up.     Chronic diastolic CHF (congestive heart failure) (HCC) Has mild leg swellings. Ordered IV Lasix in between transfusion and placed on home dose daily Lasix.  Hypothyroidism   continue Synthroid  Chronic A. fib Not a candidate for endocrine admission due to severe anemia  Diabetes mellitus type 2 Holding metformin. Monitor ascites  localized.  Lymphoma On admission post chemotherapy.  Complete heart block status post pacemaker     Code Status : DO NOT RESUSCITATE  Family Communication  : Daughter at bedside  Disposition Plan  : Home possibly in 48 hours  Barriers For Discharge : Active symptoms  Consults  :  None  Procedures  : None  DVT Prophylaxis  :  SCDs  Lab Results  Component Value Date   PLT 49 (L) 09/10/2015    Antibiotics  :  Anti-infectives    Start     Dose/Rate Route Frequency Ordered Stop   09/10/15 1500  vancomycin (VANCOCIN) IVPB 750 mg/150 ml premix     750 mg 150 mL/hr over 60 Minutes Intravenous Every 12 hours 09/10/15 0555     09/10/15 1000  ceFEPIme (MAXIPIME) 1 g in dextrose 5 % 50 mL IVPB  Status:  Discontinued     1 g 100 mL/hr over 30 Minutes Intravenous Every 8 hours 09/10/15 0542 09/10/15 0839   09/10/15 1000  ceFEPIme (MAXIPIME) 2 g in dextrose 5 % 50 mL IVPB     2 g 100 mL/hr over 30 Minutes Intravenous Every 8 hours 09/10/15 0839 09/18/15 0959   09/10/15 0130  vancomycin (VANCOCIN) 1,500 mg in sodium chloride 0.9 % 500 mL IVPB     1,500 mg 250 mL/hr over 120 Minutes Intravenous  Once 09/10/15 0119 09/10/15 0429   09/10/15 0130  ceFEPIme (MAXIPIME) 2 g in dextrose 5 % 50 mL IVPB     2 g 100 mL/hr over 30 Minutes Intravenous  Once 09/10/15 0119 09/10/15 0236        Objective:   Vitals:   09/10/15 0350 09/10/15 0942 09/10/15 1100 09/10/15 1125  BP: 118/71  120/80 132/72  Pulse: 81  90 91  Resp: 16  18 20   Temp: 98 F (36.7 C)  98.2 F (36.8 C) 98.2 F (36.8 C)  TempSrc: Oral  Oral Oral  SpO2: 100% 98% 100% 99%  Weight: 110.6 kg (243 lb 14.4 oz)     Height: 5\' 11"  (1.803 m)       Wt Readings from Last 3 Encounters:  09/10/15 110.6 kg (243 lb 14.4 oz)  09/03/15 112 kg (247 lb)  08/13/15 112.9 kg (248 lb 12.8 oz)     Intake/Output Summary (Last 24 hours) at 09/10/15 1224 Last data filed at 09/10/15 1209  Gross per 24 hour  Intake               760 ml  Output              350 ml  Net              410 ml     Physical Exam  Gen: not in distress HEENT: Pallor present, moist mucosa, supple neck Chest: Fine basilar crackles, no rhonchi or wheeze CVS: N S1&S2, no murmurs, rubs or gallop GI: soft, NT, ND, BS+ Musculoskeletal: warm, no edema CNS: Alert and oriented, nonfocal    Data Review:    CBC  Recent Labs Lab 09/03/15 1445 09/10/15 0100 09/10/15 0107  WBC 0.9 Repeated and verified X2.* 0.8*  --   HGB 7.6 Repeated and verified X2.* 6.3* 6.5*  HCT 22.3 Repeated and verified X2.* 18.6* 19.0*  PLT 58.0 Repeated and verified X2.* 49*  --   MCV 95.2 96.9  --   MCH  --  32.8  --   MCHC 34.2 33.9  --   RDW 24.1* 22.1*  --   LYMPHSABS 0.2* 0.3*  --   MONOABS 0.0* 0.0*  --   EOSABS 0.0 0.0  --   BASOSABS 0.0 0.0  --     Chemistries   Recent Labs Lab 09/10/15 0100 09/10/15 0107 09/10/15 0627  NA 132* 133*  --   K 3.9 4.0  --   CL 97* 93*  --   CO2 27  --   --   GLUCOSE 145* 144*  --   BUN 17  14  --   CREATININE 0.69 0.70  --   CALCIUM 9.2  --   --   MG 1.5*  --  1.9  AST 31  --   --   ALT 46  --   --   ALKPHOS 81  --   --   BILITOT 0.7  --   --    ------------------------------------------------------------------------------------------------------------------ No results for input(s): CHOL, HDL, LDLCALC, TRIG, CHOLHDL, LDLDIRECT in the last 72 hours.  Lab Results  Component Value Date   HGBA1C 6.9 (H) 07/01/2015   ------------------------------------------------------------------------------------------------------------------ No results for input(s): TSH, T4TOTAL, T3FREE, THYROIDAB in the last 72 hours.  Invalid input(s): FREET3 ------------------------------------------------------------------------------------------------------------------ No results for input(s): VITAMINB12, FOLATE, FERRITIN, TIBC, IRON, RETICCTPCT in the last 72 hours.  Coagulation profile No results for input(s): INR,  PROTIME in the last 168 hours.  No results for input(s): DDIMER in the last 72 hours.  Cardiac Enzymes No results for input(s): CKMB, TROPONINI, MYOGLOBIN in the last 168 hours.  Invalid input(s): CK ------------------------------------------------------------------------------------------------------------------    Component Value Date/Time   BNP 253.7 (H) 09/10/2015 0100    Inpatient Medications  Scheduled Meds: . sodium chloride   Intravenous Once  . antiseptic oral rinse  7 mL Mouth Rinse q12n4p  . calcium-vitamin D  1 tablet Oral BID WC  . ceFEPime (MAXIPIME) IV  2 g Intravenous Q8H  . chlorhexidine  15 mL Mouth Rinse BID  . diphenhydrAMINE  25 mg Oral Q6H  . famotidine  20 mg Oral QHS  . finasteride  5 mg Oral q morning - 10a  . fluticasone  2 spray Each Nare Daily  . furosemide  60 mg Intravenous Once  . [START ON 09/11/2015] furosemide  40 mg Oral Daily  . levothyroxine  25 mcg Oral QAC breakfast  . mometasone-formoterol  2 puff Inhalation BID  . multivitamin with minerals  1 tablet Oral Daily  . oxybutynin  10 mg Oral Daily  . pantoprazole  40 mg Oral Daily  . potassium chloride SA  20 mEq Oral Daily  . traZODone  150 mg Oral QHS  . vancomycin  750 mg Intravenous Q12H   Continuous Infusions:  PRN Meds:.acetaminophen **OR** acetaminophen, albuterol, ALPRAZolam, dextromethorphan-guaiFENesin, docusate sodium, hydrOXYzine, nitroGLYCERIN, ondansetron **OR** ondansetron (ZOFRAN) IV, polyethylene glycol, traMADol  Micro Results Recent Results (from the past 240 hour(s))  MRSA PCR Screening     Status: None   Collection Time: 09/10/15  4:30 AM  Result Value Ref Range Status   MRSA by PCR NEGATIVE NEGATIVE Final    Comment:        The GeneXpert MRSA Assay (FDA approved for NASAL specimens only), is one component of a comprehensive MRSA colonization surveillance program. It is not intended to diagnose MRSA infection nor to guide or monitor treatment for MRSA  infections.     Radiology Reports Dg Chest 2 View  Result Date: 09/10/2015 CLINICAL DATA:  82 y/o M; fever for 48 hours, weakness tonight, shortness of breath, and chest heaviness. EXAM: CHEST  2 VIEW COMPARISON:  Chest radiograph dated 09/03/2015. FINDINGS: Patchy opacities throughout the lungs bilaterally. Two lead pacemaker. Sternotomy wires are aligned and intact. Right central venous catheter with tip at the cavoatrial junction unchanged. No pneumothorax. No definite pleural effusion. Stable cardiomegaly given differences in technique. IMPRESSION: Diffuse patchy pulmonary opacities probably represents moderate pulmonary edema. Underlying pneumonia is not excluded. Stable cardiomegaly. Electronically Signed   By: Kristine Garbe M.D.   On: 09/10/2015 00:37   Dg Chest  2 View  Result Date: 09/03/2015 CLINICAL DATA:  Dyspnea and productive cough for 10 days EXAM: CHEST  2 VIEW COMPARISON:  June 30, 2015 FINDINGS: The heart size and mediastinal contours are stable. Heart size is enlarged. Cardiac pacemaker is unchanged. A right central venous line is noted without change. There is interstitial pulmonary edema. There is no pleural effusion or focal pneumonia. The visualized skeletal structures are stable. IMPRESSION: Congestive heart failure. Electronically Signed   By: Abelardo Diesel M.D.   On: 09/03/2015 15:43   Ct Chest Wo Contrast  Result Date: 09/10/2015 CLINICAL DATA:  82 y/o M; weakness since this afternoon. Reported fevers for the last few days. History of lymphoma status post chemotherapy. EXAM: CT CHEST WITHOUT CONTRAST TECHNIQUE: Multidetector CT imaging of the chest was performed following the standard protocol without IV contrast. COMPARISON:  CT angiogram of the chest dated 10/28/2012. FINDINGS: Cardiovascular: Postsurgical changes related to coronary artery bypass graft surgery. Mild cardiomegaly. The ascending and wires noted. Moderate coronary artery calcifications. Aortic  valvular and mitral annular calcifications. Calcific atherosclerosis of the thoracic aorta. No aortic aneurysm. Mediastinum/Nodes: Prominent hilar and AP window lymph nodes are likely reactive. Lungs/Pleura: There are consolidations in the left lower and bilateral upper lobes. Additionally there is a background of diffuse interlobular septal thickening. Trace left-sided pleural effusion. Upper Abdomen: Mild calcific aortic atherosclerosis. Lucency in the liver caudate lobe measuring 13 mm (2:127) and subcentimeter lucency in segment II are likely cysts. Moderate-sized hiatal hernia. Otherwise the upper abdomen is unremarkable. Musculoskeletal: Healed median sternotomy. No acute osseous abnormality is identified. IMPRESSION: 1. Consolidations in the left lower lobe and bilateral upper lobes probably represents multifocal pneumonia. 2. Background of septal thickening and diffuse hazy opacification is likely to represent an additional component of mild pulmonary edema. These results were called by telephone at the time of interpretation on 09/10/2015 at 2:18 am to Dr. Everlene Balls , who verbally acknowledged these results. Electronically Signed   By: Kristine Garbe M.D.   On: 09/10/2015 02:20    Time Spent in minutes  20   Louellen Molder M.D on 09/10/2015 at 12:24 PM  Between 7am to 7pm - Pager - [phone]  After 7pm go to [email or web address] - password [facility]  [facility] -  Office  [phone]

## 2015-09-10 NOTE — ED Notes (Signed)
Patient transported to X-ray, delay in lab draw

## 2015-09-10 NOTE — ED Notes (Signed)
hospitalist at bedside

## 2015-09-10 NOTE — Progress Notes (Signed)
Pharmacy Antibiotic Note  [name] is a 82 y.o. male admitted on 09/09/2015 with pneumonia.  Patient received Cefepime 2gm and Vancomycin 1500mg  IV x 1 dose each in ED.  Upon admission, Pharmacy has been consulted for Vancomycin dosing and to adjust other antibiotics as needed based on renal function.  09/10/15 CrCl (n) = 85 ml/min  Plan:  Vancomycin 750mg  IV q12h  Vancomycin Trough goal: 15-20 mcg/ml  Continue Cefepime 1gm IV q8h as ordered by MD  F/u cultures/sensitivities, renal function  Height: 5\' 11"  (180.3 cm) Weight: 243 lb 14.4 oz (110.6 kg) IBW/kg (Calculated) : 75.3  Temp (24hrs), Avg:98.3 F (36.8 C), Min:98 F (36.7 C), Max:98.6 F (37 C)   Recent Labs Lab 09/03/15 1445 09/10/15 0100 09/10/15 0107 09/10/15 0109  WBC 0.9 Repeated and verified X2.* 0.8*  --   --   CREATININE  --  0.69 0.70  --   LATICACIDVEN  --   --   --  1.95*    Estimated Creatinine Clearance: 91.6 mL/min (by C-G formula based on SCr of 0.8 mg/dL).    No Known Allergies  Antimicrobials this admission: 8/15 Vanc >>   8/15 Cefepime >>    Dose adjustments this admission:    Microbiology results: 8/15 BCx: sent 8/15 UCx: sent  8/15 MRSA PCR: sent  Thank you for allowing pharmacy to be a part of this patient's care.  Everette Rank, PharmD 09/10/2015 5:50 AM

## 2015-09-10 NOTE — H&P (Signed)
History and Physical    [name] [id number] DOB: 1933-01-13 DOA: 09/09/2015  PCP: Christinia Gully, MD  Patient coming from: Home.  Chief Complaint: Weakness.  HPI: [name] is a 82 y.o. 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 Last evening while walking to the bathroom patient felt weak and almost fell but patient slowly went down onto the floor himself. Did not lose consciousness or hit his head. Denies any chest pain or shortness of breath. Patient has been having some chronic cough for last few weeks. Patient gets transfused every 2 weeks for severe anemia. In the ER chest x-ray was showing edema versus infiltrates. CT chest shows consolidation with multifocal pneumonia. Patient is not in distress. Blood work show worsening anemia with hemoglobin around 6. Patient is being admitted for anemia and pneumonia.   ED Course: Was started on empiric antibiotics for pneumonia.  Review of Systems: As per HPI, rest all negative.   Past Medical History:  Diagnosis Date  . Paroxysmal atrial fibrillation ([place]) 03/16/11   diagnosed by PPM interrogation  . Second degree Mobitz II AV block 12/11/08   with transient syncope, resolved s/p PPM    Past Surgical History:  Procedure Laterality Date  . BI-VENTRICULAR PACEMAKER UPGRADE N/A 12/29/2012   Procedure: BI-VENTRICULAR PACEMAKER UPGRADE;  Surgeon: Evans Lance, MD;  Location: [facility] CATH LAB;  Service: Cardiovascular;  Laterality: N/A;  . INSERT / REPLACE / REMOVE PACEMAKER  12/29/2012  . LYMPH NODE BIOPSY  04/2005   groin  . PACEMAKER INSERTION  12/11/08   MDT implanted by Dr Rayann Heman  . PACEMAKER LEAD REMOVAL Left  11/04/2012   Procedure: PACEMAKER LEAD REMOVAL;  Surgeon: Evans Lance, MD;  Location: [place];  Service: Cardiovascular;  Laterality: Left;  . PENILE PROSTHESIS IMPLANT     and removal  . PERICARDIECTOMY  2000  . STERNOTOMY  2000  . TEE WITHOUT CARDIOVERSION N/A 10/31/2012   Procedure: TRANSESOPHAGEAL ECHOCARDIOGRAM (TEE);  Surgeon: Lelon Perla, MD;  Location: [facility] ENDOSCOPY;  Service: Cardiovascular;  Laterality: N/A;  . TEMPORARY PACEMAKER INSERTION Left 11/04/2012   Procedure: TEMPORARY PACEMAKER INSERTION;  Surgeon: Evans Lance, MD;  Location: [place];  Service: Cardiovascular;  Laterality: Left;     reports that he quit smoking about 47 years ago. His smoking use included Cigarettes. He has a 30.00 pack-year smoking history. He has quit using smokeless tobacco. His smokeless tobacco use included Chew. He reports that he does not drink alcohol or use drugs.  No Known Allergies  Family History  Problem Relation Age of Onset  . Diabetes Mother   . Liver cancer Brother   . Cancer Sister     Prior to Admission medications   Medication Sig Start Date End Date Taking? Authorizing Provider  albuterol (PROVENTIL HFA;VENTOLIN HFA) 108 (90 Base) MCG/ACT inhaler Inhale 2 puffs into the lungs every 4 (four) hours as needed for wheezing or shortness of breath.   Yes Historical Provider, MD  albuterol (PROVENTIL) (2.5 MG/3ML) 0.083% nebulizer solution Take 3 mLs (2.5 mg total) by nebulization every 4 (four) hours as needed for wheezing or shortness of breath. 01/29/15  Yes Tanda Rockers, MD  ALPRAZolam Duanne Moron) 0.25 MG tablet Take 0.25  mg by mouth at bedtime as needed for anxiety or sleep.   Yes Historical Provider, MD  calcium-vitamin D (OS-CAL 500 + D) 500-200 MG-UNIT per tablet Take 1 tablet by mouth 2 (two) times daily.    Yes Historical Provider, MD  chlorpheniramine (CHLOR-TRIMETON) 4 MG tablet Take 4 mg by mouth every 4 (four) hours as needed (drippy nose).   Yes Historical Provider, MD    dextromethorphan-guaiFENesin (MUCINEX DM) 30-600 MG 12hr tablet Take 1-2 tablets by mouth 2 (two) times daily as needed for cough.   Yes Historical Provider, MD  docusate sodium (COLACE) 100 MG capsule Take 100 mg by mouth daily as needed for mild constipation or moderate constipation.    Yes Historical Provider, MD  famotidine (PEPCID) 20 MG tablet Take 20 mg by mouth at bedtime.    Yes Historical Provider, MD  finasteride (PROSCAR) 5 MG tablet TAKE 1 TABLET BY MOUTH EVERY MORNING Patient taking differently: TAKE 5 MG BY MOUTH EVERY MORNING 07/18/15  Yes Tanda Rockers, MD  fluticasone (FLONASE) 50 MCG/ACT nasal spray 1-2 puffs each nostril once or twice daily for allergy Patient taking differently: Place 2 sprays into both nostrils daily.  04/12/15  Yes Deneise Lever, MD  furosemide (LASIX) 40 MG tablet TAKE 1 TABLET BY MOUTH EVERY DAY MAY TAKE 1 EXTRA TABLET DAILY IF NEEDED FOR SWELLING IN LEGS Patient taking differently: TAKE 20 MG BY MOUTH EVERY DAY MAY TAKE 20 MG EXTRA DAILY IF NEEDED FOR SWELLING IN LEGS 04/24/15  Yes Tanda Rockers, MD  hydrOXYzine (ATARAX/VISTARIL) 25 MG tablet Take 1-2 tabs by mouth every 4-6 hours as needed for itching   Yes Historical Provider, MD  KLOR-CON M20 20 MEQ tablet TAKE 1 TABLET DAILY Patient taking differently: TAKE 20 MEQ BY MOUTH DAILY 07/18/15  Yes Tanda Rockers, MD  levothyroxine (SYNTHROID, LEVOTHROID) 25 MCG tablet TAKE 1 TABLET BY MOUTH EVERY MORNING BEFORE BREAKFAST Patient taking differently: TAKE 25 MCG BY MOUTH EVERY MORNING BEFORE BREAKFAST 02/18/15  Yes Tanda Rockers, MD  metFORMIN (GLUCOPHAGE) 500 MG tablet TAKE 1 TABLET BY MOUTH TWICE A DAY WITH FOOD Patient taking differently: TAKE 250 MG BY MOUTH TWICE A DAY WITH FOOD 05/22/15  Yes Tanda Rockers, MD  mometasone-formoterol (DULERA) 200-5 MCG/ACT AERO Inhale 2 puffs into the lungs every 12 (twelve) hours.   Yes Historical Provider, MD  Multiple Vitamin (MULTIVITAMIN) tablet Take 1 tablet by  mouth every morning.    Yes Historical Provider, MD  nitroGLYCERIN (NITROSTAT) 0.4 MG SL tablet Place 1 tablet (0.4 mg total) under the tongue every 5 (five) minutes as needed for chest pain. 06/04/14  Yes Thompson Grayer, MD  omeprazole (PRILOSEC) 20 MG capsule Take 40 mg by mouth daily before breakfast.    Yes Historical Provider, MD  oxybutynin (DITROPAN-XL) 10 MG 24 hr tablet Take 10 mg by mouth every morning. Reported on 05/13/2015   Yes Historical Provider, MD  OXYGEN 2 lpm with sleep   Yes Historical Provider, MD  polyethylene glycol (MIRALAX / GLYCOLAX) packet Take 17 g by mouth daily as needed for moderate constipation or severe constipation.    Yes Historical Provider, MD  traMADol (ULTRAM) 50 MG tablet Take 1-1/2 tablet every 4 hours as needed for severe cough and pain   Yes Historical Provider, MD  traZODone (DESYREL) 150 MG tablet Take 1 tablet (150 mg total) by mouth at bedtime. 07/03/15  Yes Bonnell Public, MD  ciprofloxacin (CIPRO) 500 MG tablet Take 1  tablet (500 mg total) by mouth 2 (two) times daily. Patient not taking: Reported on 09/10/2015 09/03/15   Tanda Rockers, MD    Physical Exam: Vitals:   09/10/15 0219 09/10/15 0233 09/10/15 0313 09/10/15 0350  BP:  (!) 101/49 127/73 118/71  Pulse:  82 86 81  Resp:  20 18 16   Temp: 98.6 F (37 C)   98 F (36.7 C)  TempSrc: Rectal   Oral  SpO2:  98% 99% 100%  Weight:    243 lb 14.4 oz (110.6 kg)  Height:    5\' 11"  (1.803 m)      Constitutional: Not in distress. Vitals:   09/10/15 0219 09/10/15 0233 09/10/15 0313 09/10/15 0350  BP:  (!) 101/49 127/73 118/71  Pulse:  82 86 81  Resp:  20 18 16   Temp: 98.6 F (37 C)   98 F (36.7 C)  TempSrc: Rectal   Oral  SpO2:  98% 99% 100%  Weight:    243 lb 14.4 oz (110.6 kg)  Height:    5\' 11"  (1.803 m)   Eyes: Anicteric mild pallor. ENMT: No discharge from the ears eyes nose and mouth. Neck: No mass felt. No JVD appreciated. Respiratory: No rhonchi or  crepitations. Cardiovascular: S1 and S2 heard. Abdomen: Soft nontender bowel sounds present. No guarding or rigidity. Musculoskeletal: Mild lower extremity edema. Skin: Chronic skin changes. Neurologic: Alert awake oriented to time place and person. Moves all extremities. Psychiatric: Appears normal.   Labs on Admission: I have personally reviewed following labs and imaging studies  CBC:  Recent Labs Lab 09/03/15 1445 09/10/15 0100 09/10/15 0107  WBC 0.9 Repeated and verified X2.* 0.8*  --   NEUTROABS 0.7* 0.5*  --   HGB 7.6 Repeated and verified X2.* 6.3* 6.5*  HCT 22.3 Repeated and verified X2.* 18.6* 19.0*  MCV 95.2 96.9  --   PLT 58.0 Repeated and verified X2.* 49*  --    Basic Metabolic Panel:  Recent Labs Lab 09/10/15 0100 09/10/15 0107  NA 132* 133*  K 3.9 4.0  CL 97* 93*  CO2 27  --   GLUCOSE 145* 144*  BUN 17 14  CREATININE 0.69 0.70  CALCIUM 9.2  --   MG 1.5*  --    GFR: Estimated Creatinine Clearance: 91.6 mL/min (by C-G formula based on SCr of 0.8 mg/dL). Liver Function Tests:  Recent Labs Lab 09/10/15 0100  AST 31  ALT 46  ALKPHOS 81  BILITOT 0.7  PROT 6.2*  ALBUMIN 2.9*    Recent Labs Lab 09/10/15 0100  LIPASE 17   No results for input(s): AMMONIA in the last 168 hours. Coagulation Profile: No results for input(s): INR, PROTIME in the last 168 hours. Cardiac Enzymes: No results for input(s): CKTOTAL, CKMB, CKMBINDEX, TROPONINI in the last 168 hours. BNP (last 3 results)  Recent Labs  05/13/15 1043 07/08/15 1520 09/03/15 1445  PROBNP 271.0* 447.0* 535.0*   HbA1C: No results for input(s): HGBA1C in the last 72 hours. CBG: No results for input(s): GLUCAP in the last 168 hours. Lipid Profile: No results for input(s): CHOL, HDL, LDLCALC, TRIG, CHOLHDL, LDLDIRECT in the last 72 hours. Thyroid Function Tests: No results for input(s): TSH, T4TOTAL, FREET4, T3FREE, THYROIDAB in the last 72 hours. Anemia Panel: No results for  input(s): VITAMINB12, FOLATE, FERRITIN, TIBC, IRON, RETICCTPCT in the last 72 hours. Urine analysis:    Component Value Date/Time   COLORURINE YELLOW 09/10/2015 0203   APPEARANCEUR CLEAR 09/10/2015 0203  LABSPEC 1.011 09/10/2015 0203   PHURINE 7.0 09/10/2015 0203   GLUCOSEU NEGATIVE 09/10/2015 0203   GLUCOSEU NEGATIVE 09/03/2015 1445   HGBUR NEGATIVE 09/10/2015 0203   BILIRUBINUR NEGATIVE 09/10/2015 0203   KETONESUR NEGATIVE 09/10/2015 0203   PROTEINUR NEGATIVE 09/10/2015 0203   UROBILINOGEN 0.2 09/03/2015 1445   NITRITE NEGATIVE 09/10/2015 0203   LEUKOCYTESUR NEGATIVE 09/10/2015 0203   Sepsis Labs: @LABRCNTIP (procalcitonin:4,lacticidven:4) )No results found for this or any previous visit (from the past 240 hour(s)).   Radiological Exams on Admission: Dg Chest 2 View  Result Date: 09/10/2015 CLINICAL DATA:  82 y/o M; fever for 48 hours, weakness tonight, shortness of breath, and chest heaviness. EXAM: CHEST  2 VIEW COMPARISON:  Chest radiograph dated 09/03/2015. FINDINGS: Patchy opacities throughout the lungs bilaterally. Two lead pacemaker. Sternotomy wires are aligned and intact. Right central venous catheter with tip at the cavoatrial junction unchanged. No pneumothorax. No definite pleural effusion. Stable cardiomegaly given differences in technique. IMPRESSION: Diffuse patchy pulmonary opacities probably represents moderate pulmonary edema. Underlying pneumonia is not excluded. Stable cardiomegaly. Electronically Signed   By: Kristine Garbe M.D.   On: 09/10/2015 00:37   Ct Chest Wo Contrast  Result Date: 09/10/2015 CLINICAL DATA:  82 y/o M; weakness since this afternoon. Reported fevers for the last few days. History of lymphoma status post chemotherapy. EXAM: CT CHEST WITHOUT CONTRAST TECHNIQUE: Multidetector CT imaging of the chest was performed following the standard protocol without IV contrast. COMPARISON:  CT angiogram of the chest dated 10/28/2012. FINDINGS:  Cardiovascular: Postsurgical changes related to coronary artery bypass graft surgery. Mild cardiomegaly. The ascending and wires noted. Moderate coronary artery calcifications. Aortic valvular and mitral annular calcifications. Calcific atherosclerosis of the thoracic aorta. No aortic aneurysm. Mediastinum/Nodes: Prominent hilar and AP window lymph nodes are likely reactive. Lungs/Pleura: There are consolidations in the left lower and bilateral upper lobes. Additionally there is a background of diffuse interlobular septal thickening. Trace left-sided pleural effusion. Upper Abdomen: Mild calcific aortic atherosclerosis. Lucency in the liver caudate lobe measuring 13 mm (2:127) and subcentimeter lucency in segment II are likely cysts. Moderate-sized hiatal hernia. Otherwise the upper abdomen is unremarkable. Musculoskeletal: Healed median sternotomy. No acute osseous abnormality is identified. IMPRESSION: 1. Consolidations in the left lower lobe and bilateral upper lobes probably represents multifocal pneumonia. 2. Background of septal thickening and diffuse hazy opacification is likely to represent an additional component of mild pulmonary edema. These results were called by telephone at the time of interpretation on 09/10/2015 at 2:18 am to Dr. Everlene Balls , who verbally acknowledged these results. Electronically Signed   By: Kristine Garbe M.D.   On: 09/10/2015 02:20    EKG: Independently reviewed. Paced rhythm.  Assessment/Plan Principal Problem:   HCAP (healthcare-associated pneumonia) Active Problems:   Symptomatic anemia   Pacemaker   Chronic diastolic CHF (congestive heart failure) ([place])    1. Healthcare associated pneumonia - patient has been placed on vancomycin and cefepime. Follow cultures. 2. 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 Ordered 1 dose of Lasix 20 mg IV following the  first unit. 3. Hypertension and possible diastolic CHF last EF measured was 55-60% in October 2014 - if lactate levels normalize may continue oral Lasix 40 mg. Follow intake output and daily weights. 4. Chronic A. fib not a candidate for Coumadin secondary to severe pancytopenia. 5. Diabetes mellitus type 2 - oral metformin 1 inpatient. Patient will be  on sliding scale coverage. 6. Complete heart block status post pacemaker placement. 7. Lymphoma status post CHOP regimen in remission. 8. Hypothyroidism on Synthroid. Check TSH.   DVT prophylaxis: SCDs. Code Status: DO NOT RESUSCITATE.  Family Communication: Patient's daughter.  Disposition Plan: Home.  Consults called: Physical therapy.  Admission status: Observation. Telemetry.    Rise Patience MD [facility] Pager [phone].  If 7PM-7AM, please contact night-coverage [email or web address] Password [facility]  09/10/2015, 5:43 AM

## 2015-09-10 NOTE — Evaluation (Signed)
Physical Therapy Evaluation Patient Details Name: [name] MRN: [id number] DOB: [birth year] Today's Date: 09/10/2015   History of Present Illness  82 year old male with PMHx of DM, Hypothyroidism, dCHF, AV block s/p PPM, Lymphoma; adm with weakness and near syncopal episode  Clinical Impression  Pt admitted with above diagnosis. Pt currently with functional limitations due to the deficits listed below (see PT Problem List). * Pt will benefit from skilled PT to increase their independence and safety with mobility to allow discharge to the venue listed below.  Will continue to follow, likely will be able to D/C home without PT f/u depending on progress; limited this date d/t low Hgb/DOE  and about to start transfusion; did well with bed mobility and tranfsers requiring only min/guard     Follow Up Recommendations No PT follow up    Equipment Recommendations  None recommended by PT    Recommendations for Other Services       Precautions / Restrictions Precautions Precautions: Fall Precaution Comments: Hgb 6.5 on eval      Mobility  Bed Mobility Overal bed mobility: Needs Assistance Bed Mobility: Supine to Sit     Supine to sit: Supervision     General bed mobility comments: for safety  Transfers Overall transfer level: Needs assistance   Transfers: Sit to/from Stand Sit to Stand: Min guard         General transfer comment: for safety d/t dizziness  Ambulation/Gait     Assistive device: 2 person hand held assist       General Gait Details: NT d/t pt dizzy, with low HGB/incr WOB; lateral steps along EOB only with min/guard  Stairs            Wheelchair Mobility    Modified Rankin (Stroke Patients Only)       Balance     Sitting balance-Leahy Scale: Fair       Standing balance-Leahy Scale: Fair                               Pertinent Vitals/Pain Pain Assessment: No/denies pain    Home Living Family/patient expects to  be discharged to:: Private residence Living Arrangements: Alone Available Help at Discharge: Family;Available PRN/intermittently (dtr brings breakfast daily stays about 1 hr) Type of Home: House Home Access: Level entry     Home Layout: One level Home Equipment: Walker - 2 wheels;Cane - single point;Grab bars - toilet Additional Comments: walks with RW    Prior Function Level of Independence: Independent with assistive device(s)         Comments: daughter gets Actor Dominance        Extremity/Trunk Assessment   Upper Extremity Assessment: Overall WFL for tasks assessed           Lower Extremity Assessment: Overall WFL for tasks assessed         Communication   Communication: HOH  Cognition Arousal/Alertness: Awake/alert Behavior During Therapy: WFL for tasks assessed/performed Overall Cognitive Status: Within Functional Limits for tasks assessed                      General Comments      Exercises        Assessment/Plan    PT Assessment Patient needs continued PT services  PT Diagnosis Generalized weakness   PT Problem List Decreased activity tolerance;Decreased mobility  PT Treatment Interventions DME instruction;Gait  training;Functional mobility training;Therapeutic exercise;Therapeutic activities   PT Goals (Current goals can be found in the Care Plan section) Acute Rehab PT Goals Patient Stated Goal: home  PT Goal Formulation: With patient Time For Goal Achievement: 09/17/15 Potential to Achieve Goals: Good    Frequency Min 3X/week   Barriers to discharge        Co-evaluation               End of Session   Activity Tolerance: Patient tolerated treatment well Patient left: with call bell/phone within reach;Other (comment);with nursing/sitter in room (EOB )      Functional Assessment Tool Used: clinical judgement Functional Limitation: Changing and maintaining body position Changing and Maintaining Body  Position Current Status [id number]): At least 1 percent but less than 20 percent impaired, limited or restricted Changing and Maintaining Body Position Goal Status [id number]): At least 1 percent but less than 20 percent impaired, limited or restricted    Time: 1100-1111 PT Time Calculation (min) (ACUTE ONLY): 11 min   Charges:   PT Evaluation $PT Eval Low Complexity: 1 Procedure     PT G Codes:   PT G-Codes **NOT FOR INPATIENT CLASS** Functional Assessment Tool Used: clinical judgement Functional Limitation: Changing and maintaining body position Changing and Maintaining Body Position Current Status [id number]): At least 1 percent but less than 20 percent impaired, limited or restricted Changing and Maintaining Body Position Goal Status [id number]): At least 1 percent but less than 20 percent impaired, limited or restricted    [facility] 09/10/2015, 11:59 AM

## 2015-09-10 NOTE — ED Notes (Signed)
Pt transported to CT ?

## 2015-09-11 ENCOUNTER — Other Ambulatory Visit: Payer: Medicare Other

## 2015-09-11 ENCOUNTER — Ambulatory Visit: Payer: Medicare Other | Admitting: Oncology

## 2015-09-11 DIAGNOSIS — J189 Pneumonia, unspecified organism: Principal | ICD-10-CM

## 2015-09-11 LAB — CBC
HCT: 21.5 % — ABNORMAL LOW (ref 39.0–52.0)
Hemoglobin: 7.4 g/dL — ABNORMAL LOW (ref 13.0–17.0)
MCH: 31.5 pg (ref 26.0–34.0)
MCHC: 34.4 g/dL (ref 30.0–36.0)
MCV: 91.5 fL (ref 78.0–100.0)
Platelets: 40 10*3/uL — ABNORMAL LOW (ref 150–400)
RBC: 2.35 MIL/uL — ABNORMAL LOW (ref 4.22–5.81)
RDW: 21.4 % — ABNORMAL HIGH (ref 11.5–15.5)
WBC: 0.8 10*3/uL — CL (ref 4.0–10.5)

## 2015-09-11 LAB — BASIC METABOLIC PANEL
Anion gap: 8 (ref 5–15)
BUN: 18 mg/dL (ref 6–20)
CO2: 29 mmol/L (ref 22–32)
Calcium: 8.7 mg/dL — ABNORMAL LOW (ref 8.9–10.3)
Chloride: 96 mmol/L — ABNORMAL LOW (ref 101–111)
Creatinine, Ser: 0.73 mg/dL (ref 0.61–1.24)
Glucose, Bld: 142 mg/dL — ABNORMAL HIGH (ref 65–99)
POTASSIUM: 3.6 mmol/L (ref 3.5–5.1)
SODIUM: 133 mmol/L — AB (ref 135–145)

## 2015-09-11 LAB — PREPARE RBC (CROSSMATCH)

## 2015-09-11 LAB — URINE CULTURE

## 2015-09-11 LAB — EXPECTORATED SPUTUM ASSESSMENT W GRAM STAIN, RFLX TO RESP C

## 2015-09-11 MED ORDER — DIPHENHYDRAMINE HCL 25 MG PO CAPS: 25.0000 mg | ORAL_CAPSULE | Freq: Every day | ORAL | Status: DC | PRN

## 2015-09-11 MED ORDER — SODIUM CHLORIDE 0.9 % IV SOLN
Freq: Once | INTRAVENOUS | Status: AC
  Administered 2015-09-11: 18:00:00 via INTRAVENOUS

## 2015-09-11 MED ORDER — SODIUM CHLORIDE 0.9% FLUSH
10.0000 mL | INTRAVENOUS | Status: DC | PRN
  Administered 2015-09-13: 10 mL
  Filled 2015-09-11: qty 40

## 2015-09-11 NOTE — Progress Notes (Signed)
PROGRESS NOTE                                                                                                                                                                                                             Patient Demographics:    [name], is a 82 y.o. 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 fib not an adequate ideation recently treated with ciprofloxacin for UTI was brought to the ED as he was feeling very weak for past 2 weeks and had a near-syncopal episode. In the ED his hemoglobin was 6. Chest x-ray showed edema versus infiltrate. CT chest done showed multifocal pneumonia.   Subjective:   Patient reports nonproductive cough and weakness.   Assessment  & Plan :   Principal Problem: HCAP (healthcare-associated pneumonia) - Empiric Vanco and cefepime. Follow cultures. Supportive care with Tylenol and antitussives. - plan to transition to oral ABX in AM  Active Problems: Generalized weakness with Symptomatic pancytopenia  - Secondary to myelodysplasia. Receives transfusion as outpatient.  - has already received 2 units PRBC transfusion. IV Lasix in between transfusion.  - needs additional unit of blood prior to transfusion  - CBC In AM  Chronic diastolic CHF (congestive heart failure) (HCC) - on IV Lasix in between transfusion and placed on home dose daily Lasix  Hypothyroidism  - continue Synthroid  Chronic A. fib - Not a candidate for endocrine admission due to severe anemia  Diabetes mellitus type 2 - Holding metformin. Monitor ascites  localized.  Lymphoma - outpatient follow up   Complete heart block status post pacemaker - keep on tele for now   Obesity  - Body mass index is 34.02 kg/m.  Code Status : DO NOT RESUSCITATE  Family Communication  : Daughter at bedside  Disposition Plan  : Home possibly in 48 hours  Barriers For Discharge : Active symptoms  Consults  :  None  Procedures  : None  DVT Prophylaxis  :  SCDs  Lab Results  Component Value Date  PLT 40 (L) 09/11/2015    Antibiotics  :    Anti-infectives    Start     Dose/Rate Route Frequency Ordered Stop   09/10/15 1500  vancomycin (VANCOCIN) IVPB 750 mg/150 ml premix     750 mg 150 mL/hr over 60 Minutes Intravenous Every 12 hours 09/10/15 0555     09/10/15 1000  ceFEPIme (MAXIPIME) 1 g in dextrose 5 % 50 mL IVPB  Status:  Discontinued     1 g 100 mL/hr over 30 Minutes Intravenous Every 8 hours 09/10/15 0542 09/10/15 0839   09/10/15 1000  ceFEPIme (MAXIPIME) 2 g in dextrose 5 % 50 mL IVPB     2 g 100 mL/hr over 30 Minutes Intravenous Every 8 hours 09/10/15 0839 09/18/15 0959   09/10/15 0130  vancomycin (VANCOCIN) 1,500 mg in sodium chloride 0.9 % 500 mL IVPB     1,500 mg 250 mL/hr over 120 Minutes Intravenous  Once 09/10/15 0119 09/10/15 0429   09/10/15 0130  ceFEPIme (MAXIPIME) 2 g in dextrose 5 % 50 mL IVPB     2 g 100 mL/hr over 30 Minutes Intravenous  Once 09/10/15 0119 09/10/15 0236        Objective:   Vitals:   09/10/15 2008 09/10/15 2057 09/11/15 0300 09/11/15 0811  BP:  (!) 106/93 (!) 94/44   Pulse:  87 76 81  Resp:    18  Temp:  99.1 F (37.3 C) 98.8 F (37.1 C)   TempSrc:  Oral Oral   SpO2: 97% 99% 99% 97%  Weight:      Height:        Wt Readings from Last 3 Encounters:  09/10/15 110.6 kg (243 lb 14.4 oz)  09/03/15 112 kg (247 lb)  08/13/15 112.9 kg (248 lb 12.8 oz)    Intake/Output Summary (Last 24 hours) at 09/11/15 1307 Last data filed at 09/11/15 1241  Gross per 24 hour  Intake             1306 ml   Output             1150 ml  Net              156 ml   Physical Exam Gen: not in distress HEENT: Pallor present, moist mucosa, supple neck Chest: Fine basilar crackles, no rhonchi or wheeze CVS: N S1&S2, no murmurs, rubs or gallop GI: soft, NT, ND, BS+ Musculoskeletal: warm, no LE edema CNS: Alert and oriented, nonfocal   Data Review:    CBC  Recent Labs Lab 09/10/15 0100 09/10/15 0107 09/11/15 0534  WBC 0.8*  --  0.8*  HGB 6.3* 6.5* 7.4*  HCT 18.6* 19.0* 21.5*  PLT 49*  --  40*  MCV 96.9  --  91.5  MCH 32.8  --  31.5  MCHC 33.9  --  34.4  RDW 22.1*  --  21.4*  LYMPHSABS 0.3*  --   --   MONOABS 0.0*  --   --   EOSABS 0.0  --   --   BASOSABS 0.0  --   --     Chemistries   Recent Labs Lab 09/10/15 0100 09/10/15 0107 09/10/15 0627 09/11/15 0534  NA 132* 133*  --  133*  K 3.9 4.0  --  3.6  CL 97* 93*  --  96*  CO2 27  --   --  29  GLUCOSE 145* 144*  --  142*  BUN 17 14  --  18  CREATININE 0.69 0.70  --  0.73  CALCIUM 9.2  --   --  8.7*  MG 1.5*  --  1.9  --   AST 31  --   --   --   ALT 46  --   --   --   ALKPHOS 81  --   --   --   BILITOT 0.7  --   --   --     Lab Results  Component Value Date   HGBA1C 6.9 (H) 07/01/2015      Component Value Date/Time   BNP 253.7 (H) 09/10/2015 0100    Inpatient Medications  Scheduled Meds: . sodium chloride   Intravenous Once  . antiseptic oral rinse  7 mL Mouth Rinse q12n4p  . calcium-vitamin D  1 tablet Oral BID WC  . ceFEPime (MAXIPIME) IV  2 g Intravenous Q8H  . chlorhexidine  15 mL Mouth Rinse BID  . famotidine  20 mg Oral QHS  . finasteride  5 mg Oral q morning - 10a  . fluticasone  2 spray Each Nare Daily  . furosemide  40 mg Oral Daily  . levothyroxine  25 mcg Oral QAC breakfast  . mometasone-formoterol  2 puff Inhalation BID  . multivitamin with minerals  1 tablet Oral Daily  . oxybutynin  10 mg Oral Daily  . pantoprazole  40 mg Oral Daily  . potassium chloride SA  20 mEq Oral Daily  .  traZODone  150 mg Oral QHS  . vancomycin  750 mg Intravenous Q12H   Continuous Infusions:  PRN Meds:.acetaminophen **OR** acetaminophen, albuterol, ALPRAZolam, dextromethorphan-guaiFENesin, diphenhydrAMINE, docusate sodium, hydrOXYzine, nitroGLYCERIN, ondansetron **OR** ondansetron (ZOFRAN) IV, polyethylene glycol, traMADol  Micro Results Recent Results (from the past 240 hour(s))  Urine culture     Status: Abnormal   Collection Time: 09/10/15  2:03 AM  Result Value Ref Range Status   Specimen Description URINE, RANDOM  Final   Special Requests NONE  Final   Culture (A)  Final    <10,000 COLONIES/mL INSIGNIFICANT GROWTH Performed at [facility]    Report Status 09/11/2015 FINAL  Final  MRSA PCR Screening     Status: None   Collection Time: 09/10/15  4:30 AM  Result Value Ref Range Status   MRSA by PCR NEGATIVE NEGATIVE Final    Comment:        The GeneXpert MRSA Assay (FDA approved for NASAL specimens only), is one component of a comprehensive MRSA colonization surveillance program. It is not intended to diagnose MRSA infection nor to guide or monitor treatment for MRSA infections.   Culture, sputum-assessment     Status: None   Collection Time: 09/11/15  4:26 AM  Result Value Ref Range Status   Specimen Description SPUTUM  Final   Special Requests NONE  Final   Sputum evaluation   Final    MICROSCOPIC FINDINGS SUGGEST THAT THIS SPECIMEN IS NOT REPRESENTATIVE OF LOWER RESPIRATORY SECRETIONS. PLEASE RECOLLECT. NOTIFIED K. BOOKER RN AT 0515 ON 08.16.17 BY SHUEA    Report Status 09/11/2015 FINAL  Final    Radiology Reports Dg Chest 2 View  Result Date: 09/10/2015 CLINICAL DATA:  82 y/o M; fever for 48 hours, weakness tonight, shortness of breath, and chest heaviness. EXAM: CHEST  2 VIEW COMPARISON:  Chest radiograph dated 09/03/2015. FINDINGS: Patchy opacities throughout the lungs bilaterally. Two lead pacemaker. Sternotomy wires are aligned and intact. Right  central venous catheter with tip at the cavoatrial junction unchanged. No pneumothorax.  No definite pleural effusion. Stable cardiomegaly given differences in technique. IMPRESSION: Diffuse patchy pulmonary opacities probably represents moderate pulmonary edema. Underlying pneumonia is not excluded. Stable cardiomegaly. Electronically Signed   By: Kristine Garbe M.D.   On: 09/10/2015 00:37   Dg Chest 2 View  Result Date: 09/03/2015 CLINICAL DATA:  Dyspnea and productive cough for 10 days EXAM: CHEST  2 VIEW COMPARISON:  June 30, 2015 FINDINGS: The heart size and mediastinal contours are stable. Heart size is enlarged. Cardiac pacemaker is unchanged. A right central venous line is noted without change. There is interstitial pulmonary edema. There is no pleural effusion or focal pneumonia. The visualized skeletal structures are stable. IMPRESSION: Congestive heart failure. Electronically Signed   By: Abelardo Diesel M.D.   On: 09/03/2015 15:43   Ct Chest Wo Contrast  Result Date: 09/10/2015 CLINICAL DATA:  82 y/o M; weakness since this afternoon. Reported fevers for the last few days. History of lymphoma status post chemotherapy. EXAM: CT CHEST WITHOUT CONTRAST TECHNIQUE: Multidetector CT imaging of the chest was performed following the standard protocol without IV contrast. COMPARISON:  CT angiogram of the chest dated 10/28/2012. FINDINGS: Cardiovascular: Postsurgical changes related to coronary artery bypass graft surgery. Mild cardiomegaly. The ascending and wires noted. Moderate coronary artery calcifications. Aortic valvular and mitral annular calcifications. Calcific atherosclerosis of the thoracic aorta. No aortic aneurysm. Mediastinum/Nodes: Prominent hilar and AP window lymph nodes are likely reactive. Lungs/Pleura: There are consolidations in the left lower and bilateral upper lobes. Additionally there is a background of diffuse interlobular septal thickening. Trace left-sided pleural effusion.  Upper Abdomen: Mild calcific aortic atherosclerosis. Lucency in the liver caudate lobe measuring 13 mm (2:127) and subcentimeter lucency in segment II are likely cysts. Moderate-sized hiatal hernia. Otherwise the upper abdomen is unremarkable. Musculoskeletal: Healed median sternotomy. No acute osseous abnormality is identified. IMPRESSION: 1. Consolidations in the left lower lobe and bilateral upper lobes probably represents multifocal pneumonia. 2. Background of septal thickening and diffuse hazy opacification is likely to represent an additional component of mild pulmonary edema. These results were called by telephone at the time of interpretation on 09/10/2015 at 2:18 am to Dr. Everlene Balls , who verbally acknowledged these results. Electronically Signed   By: Kristine Garbe M.D.   On: 09/10/2015 02:20    Time Spent in minutes  33   Faye Ramsay M.D on 09/11/2015 at 1:07 PM  Between 7am to 7pm - Pager - [phone]  After 7pm go to [email or web address] - password [facility]  [facility] -  Office  [phone]

## 2015-09-12 ENCOUNTER — Inpatient Hospital Stay (HOSPITAL_COMMUNITY): Payer: Medicare Other

## 2015-09-12 LAB — CBC
HEMATOCRIT: 22.7 % — AB (ref 39.0–52.0)
Hemoglobin: 7.9 g/dL — ABNORMAL LOW (ref 13.0–17.0)
MCH: 31.7 pg (ref 26.0–34.0)
MCHC: 34.8 g/dL (ref 30.0–36.0)
MCV: 91.2 fL (ref 78.0–100.0)
Platelets: 41 10*3/uL — ABNORMAL LOW (ref 150–400)
RBC: 2.49 MIL/uL — ABNORMAL LOW (ref 4.22–5.81)
RDW: 19.8 % — AB (ref 11.5–15.5)
WBC: 0.7 10*3/uL — CL (ref 4.0–10.5)

## 2015-09-12 LAB — TYPE AND SCREEN
ABO/RH(D): A POS
ANTIBODY SCREEN: NEGATIVE
UNIT DIVISION: 0
Unit division: 0
Unit division: 0

## 2015-09-12 LAB — BASIC METABOLIC PANEL
Anion gap: 7 (ref 5–15)
BUN: 14 mg/dL (ref 6–20)
CALCIUM: 8.5 mg/dL — AB (ref 8.9–10.3)
CO2: 30 mmol/L (ref 22–32)
CREATININE: 0.64 mg/dL (ref 0.61–1.24)
Chloride: 96 mmol/L — ABNORMAL LOW (ref 101–111)
GFR calc Af Amer: >60 mL/min (ref >60)
GFR calc non Af Amer: >60 mL/min (ref >60)
GLUCOSE: 154 mg/dL — AB (ref 65–99)
Potassium: 3.5 mmol/L (ref 3.5–5.1)
Sodium: 133 mmol/L — ABNORMAL LOW (ref 135–145)

## 2015-09-12 LAB — EXPECTORATED SPUTUM ASSESSMENT W GRAM STAIN, RFLX TO RESP C

## 2015-09-12 LAB — EXPECTORATED SPUTUM ASSESSMENT W REFEX TO RESP CULTURE

## 2015-09-12 MED ORDER — ACYCLOVIR 5 % EX OINT
TOPICAL_OINTMENT | Freq: Four times a day (QID) | CUTANEOUS | Status: DC
  Administered 2015-09-12 (×3): via TOPICAL
  Administered 2015-09-12: 1 via TOPICAL
  Administered 2015-09-13 (×2): via TOPICAL
  Filled 2015-09-12: qty 5

## 2015-09-12 MED ORDER — LEVOFLOXACIN 500 MG PO TABS
500.0000 mg | ORAL_TABLET | Freq: Every day | ORAL | Status: DC
  Administered 2015-09-12: 500 mg via ORAL
  Filled 2015-09-12: qty 1

## 2015-09-12 NOTE — Progress Notes (Signed)
Physical Therapy Treatment Patient Details Name: [name] MRN: [id number] DOB: 04-01-1932 Today's Date: 09/12/2015    History of Present Illness 82 year old male with PMHx of DM, Hypothyroidism, dCHF, AV block s/p PPM, Lymphoma; adm with weakness and near syncopal episode    PT Comments    Assisted pt OOB to amb to bathroom at [place] with RW then amb in hallway. RA lowest 88% but avg 89/90. Pt lives alone but has a very supportive daughter who assist with house/personal and transportation needs.  Pt also has a life alert watch.  Pt plans to return home, he typically uses a walker and oxygen at night.  Follow Up Recommendations  Home health PT     Equipment Recommendations  None recommended by PT    Recommendations for Other Services       Precautions / Restrictions Precautions Precautions: Fall Precaution Comments: fluid restrictions Restrictions Weight Bearing Restrictions: No    Mobility  Bed Mobility Overal bed mobility: Needs Assistance Bed Mobility: Supine to Sit     Supine to sit: Supervision     General bed mobility comments: able to self rise  Transfers Overall transfer level: Needs assistance Equipment used: Rolling walker (2 wheeled) Transfers: Sit to/from Stand Sit to Stand: Supervision;Min guard         General transfer comment: good safety tech and use of hands to steady self  Ambulation/Gait Ambulation/Gait assistance: Supervision;Min guard Ambulation Distance (Feet): 75 Feet Assistive device: Rolling walker (2 wheeled) Gait Pattern/deviations: Step-through pattern Gait velocity: decreased but functional   General Gait Details: tolerated increased distance.  No LOB.  RA ranged from 92 - 88% with mild dyspnea.   Stairs            Wheelchair Mobility    Modified Rankin (Stroke Patients Only)       Balance                                    Cognition Arousal/Alertness:  Awake/alert Behavior During Therapy: WFL for tasks assessed/performed Overall Cognitive Status: Within Functional Limits for tasks assessed                      Exercises      General Comments        Pertinent Vitals/Pain Pain Assessment: No/denies pain    Home Living                      Prior Function            PT Goals (current goals can now be found in the care plan section) Progress towards PT goals: Progressing toward goals    Frequency  Min 3X/week    PT Plan Current plan remains appropriate    Co-evaluation             End of Session Equipment Utilized During Treatment: Gait belt Activity Tolerance: Patient tolerated treatment well Patient left: in chair;with chair alarm set;with family/visitor present     Time: [id number] PT Time Calculation (min) (ACUTE ONLY): 25 min  Charges:  $Gait Training: 8-22 mins $Therapeutic Activity: 8-22 mins                    G Codes:      [name]  PTA [facility]  Acute  Rehab Pager      [phone]

## 2015-09-12 NOTE — Consult Note (Signed)
   [facility] CM Inpatient Consult   09/12/2015  [name] 1932-11-16 [id number]    Patient screened for [place] Management services. Went to bedside to offer and explain [facility] Care Management program with patient. [name] declined [place] Management follow up. Accepted [facility] Care Management brochure with contact information to call in future if changes mind. Will make inpatient RNCM aware that patient declined [place] Management program services.  Marthenia Rolling, MSN-Ed, RN,BSN [facility] Liaison [phone]

## 2015-09-12 NOTE — Progress Notes (Addendum)
   09/12/15 1800  What Happened  Was fall witnessed? No  Was patient injured? Unsure (pt c/o L knee and L shoulder pain- x-rays pending)  Patient found on floor  Found by Staff-comment (Sherrie B)  Stated prior activity ambulating-unassisted  Follow Up  MD notified Doyle Askew  Time MD notified 1800  Family notified Yes-comment (called daughter- stephanie)  Time family notified 1800  Additional tests Yes-comment (x-ray of knee and shoulder pending)  Simple treatment Other (comment)  Progress note created (see row info) Yes  Adult Fall Risk Assessment  Risk Factor Category (scoring not indicated) High fall risk per protocol (document High fall risk)  Patient's Fall Risk High Fall Risk (>13 points)  Adult Fall Risk Interventions  Required Bundle Interventions *See Row Information* High fall risk - low, moderate, and high requirements implemented  Additional Interventions Use of appropriate toileting equipment (bedpan, BSC, etc.);Individualized elimination schedule  Screening for Fall Injury Risk  Risk For Fall Injury- See Row Information  Nurse judgement;F  Injury Prevention Interventions Family supervision  Vitals  Temp 98 F (36.7 C)  Temp Source Oral  BP 130/90  BP Location Right Arm  BP Method Automatic  Pulse Rate 98  Resp 18  Oxygen Therapy  SpO2 100 %  O2 Device Nasal Cannula  O2 Flow Rate (L/min) 2 L/min  Pain Assessment  Pain Assessment 0-10 (c/o soreness in shoulder)  Pain Score 2  Pain Type Acute pain  Pain Location Shoulder  Pain Orientation Left  Pain Descriptors / Indicators Sore  Pain Frequency Constant  Pain Onset On-going  Patients Stated Pain Goal 1  Pain Intervention(s) Medication (See eMAR)  PCA/Epidural/Spinal Assessment  Respiratory Pattern Regular  Neurological  Neuro (WDL) WDL  Musculoskeletal  Musculoskeletal (WDL) X  Assistive Device None  Generalized Weakness Yes  Weight Bearing Restrictions No  Integumentary  Integumentary (WDL) X   Skin Color Appropriate for ethnicity  Skin Integrity Abrasion  Abrasion Location Lip;Leg  Abrasion Location Orientation Right;Left (left knee)  Abrasion Intervention Cleansed  Pain Assessment  Date Pain First Started 09/12/15  Result of Injury Yes  Pain Screening  Clinical Progression Gradually improving   Patient found sitting on the floor when nurse tech responded to the bed alarm.  Pt stated he was trying to sit on the side of the bed and slipped on the sheets.  He stated he landed on his knee. C/o knee and shoulder soreness. He reported that he did not hit his head.  Vitals and neuro assessment stable.  Educated patient on importance of calling staff when trying to get out of bed.  Preformed safety huddle with CN and NT that responded to fall.  MD and family made aware, order placed for shoulder and knee x-rays.

## 2015-09-12 NOTE — Progress Notes (Signed)
PROGRESS NOTE                                                                                                                                                                                                             Patient Demographics:    [name], is a 82 y.o. 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 fib not an adequate ideation recently treated with ciprofloxacin for UTI was brought to the ED as he was feeling very weak for past 2 weeks and had a near-syncopal episode. In the ED his hemoglobin was 6. Chest x-ray showed edema versus infiltrate. CT chest done showed multifocal pneumonia.   Subjective:   Patient reports nonproductive cough and weakness. Overall better.    Assessment  & Plan :   Principal Problem: HCAP (healthcare-associated pneumonia) - pt started on Empiric Vanco and cefepime. Follow cultures. Supportive care with Tylenol and antitussives. - plan to transition to oral Levaquin this AM - if pt doing Ok, can be discharged in 24 hours   Active Problems: Generalized weakness with Symptomatic pancytopenia  - Secondary to myelodysplasia. Receives transfusion as outpatient.  - has already received  units PRBC transfusion since admission with  IV Lasix in between transfusion.  - discussed with Dr. [name], reviewed blood counts, he recommended no Plt transfusion unless pt actively bleeding and no Neupogen unless pt septic - Hg stable in the past 24 hours - CBC In AM and if blood counts stable, can plan on d/c home in AM - pt also interested in palliative care, consult placed   Chronic  diastolic CHF (congestive heart failure) ([place]) - on IV Lasix in between transfusion and placed on home dose daily Lasix  Hypothyroidism  - continue Synthroid  Chronic A. fib - Not a candidate for [facility] due to high risk bleed   Diabetes mellitus type 2 - Holding metformin. Monitor CBG's  Lymphoma - outpatient follow up with Dr. [name] - PCT consulted as well for further goals of care discussions per pt's request   Complete  heart block status post pacemaker - d/c tele   Obesity  - Body mass index is 34.02 kg/m.  Code Status : DO NOT RESUSCITATE  Family Communication  : Daughter at bedside  Disposition Plan  : Home possibly in 64 - 48 hours  Barriers For Discharge : Active symptoms  Consults  :  Dr. [name] over the phone notified   Procedures  : None  DVT Prophylaxis  :  SCDs  Lab Results  Component Value Date   PLT 41 (L) 09/12/2015    Antibiotics  :    Anti-infectives    Start     Dose/Rate Route Frequency Ordered Stop   09/10/15 1500  vancomycin (VANCOCIN) IVPB 750 mg/150 ml premix     750 mg 150 mL/hr over 60 Minutes Intravenous Every 12 hours 09/10/15 0555     09/10/15 1000  ceFEPIme (MAXIPIME) 1 g in dextrose 5 % 50 mL IVPB  Status:  Discontinued     1 g 100 mL/hr over 30 Minutes Intravenous Every 8 hours 09/10/15 0542 09/10/15 0839   09/10/15 1000  ceFEPIme (MAXIPIME) 2 g in dextrose 5 % 50 mL IVPB     2 g 100 mL/hr over 30 Minutes Intravenous Every 8 hours 09/10/15 0839 09/18/15 0959   09/10/15 0130  vancomycin (VANCOCIN) 1,500 mg in sodium chloride 0.9 % 500 mL IVPB     1,500 mg 250 mL/hr over 120 Minutes Intravenous  Once 09/10/15 0119 09/10/15 0429   09/10/15 0130  ceFEPIme (MAXIPIME) 2 g in dextrose 5 % 50 mL IVPB     2 g 100 mL/hr over 30 Minutes Intravenous  Once 09/10/15 0119 09/10/15 0236        Objective:   Vitals:   09/11/15 1825 09/11/15 2038 09/11/15 2046 09/12/15 0538  BP: 115/71  115/65 121/72  Pulse: 89  88 87  Resp: (!) 22  20 20    Temp: 98.7 F (37.1 C)  98.1 F (36.7 C) 98.7 F (37.1 C)  TempSrc: Oral  Oral Oral  SpO2:  95% 100% 98%  Weight:    108.1 kg (238 lb 6.4 oz)  Height:        Wt Readings from Last 3 Encounters:  09/12/15 108.1 kg (238 lb 6.4 oz)  09/03/15 112 kg (247 lb)  08/13/15 112.9 kg (248 lb 12.8 oz)    Intake/Output Summary (Last 24 hours) at 09/12/15 0727 Last data filed at 09/12/15 [id number]  Gross per 24 hour  Intake             1732 ml  Output             1150 ml  Net              582 ml   Physical Exam Gen: not in distress HEENT: Pallor present, moist mucosa, supple neck Chest: Fine basilar crackles, no rhonchi or wheeze CVS: N S1&S2, no murmurs, rubs or gallop GI: soft, NT, ND, BS+ Musculoskeletal: warm, no LE edema CNS: Alert and oriented, nonfocal   Data Review:    CBC  Recent Labs Lab 09/10/15 0100 09/10/15 0107 09/11/15 0534 09/12/15 0440  WBC 0.8*  --  0.8* 0.7*  HGB 6.3* 6.5* 7.4* 7.9*  HCT 18.6* 19.0* 21.5* 22.7*  PLT 49*  --  40* 41*  MCV 96.9  --  91.5 91.2  MCH 32.8  --  31.5 31.7  MCHC 33.9  --  34.4 34.8  RDW 22.1*  --  21.4* 19.8*  LYMPHSABS 0.3*  --   --   --   MONOABS 0.0*  --   --   --   EOSABS 0.0  --   --   --   BASOSABS 0.0  --   --   --     Chemistries   Recent Labs Lab 09/10/15 0100 09/10/15 0107 09/10/15 0627 09/11/15 0534 09/12/15 0440  NA 132* 133*  --  133* 133*  K 3.9 4.0  --  3.6 3.5  CL 97* 93*  --  96* 96*  CO2 27  --   --  29 30  GLUCOSE 145* 144*  --  142* 154*  BUN 17 14  --  18 14  CREATININE 0.69 0.70  --  0.73 0.64  CALCIUM 9.2  --   --  8.7* 8.5*  MG 1.5*  --  1.9  --   --   AST 31  --   --   --   --   ALT 46  --   --   --   --   ALKPHOS 81  --   --   --   --   BILITOT 0.7  --   --   --   --     Lab Results  Component Value Date   HGBA1C 6.9 (H) 07/01/2015      Component Value Date/Time   BNP 253.7 (H) 09/10/2015 0100    Inpatient Medications  Scheduled Meds: . sodium chloride   Intravenous Once    . antiseptic oral rinse  7 mL Mouth Rinse q12n4p  . calcium-vitamin D  1 tablet Oral BID WC  . ceFEPime (MAXIPIME) IV  2 g Intravenous Q8H  . chlorhexidine  15 mL Mouth Rinse BID  . famotidine  20 mg Oral QHS  . finasteride  5 mg Oral q morning - 10a  . fluticasone  2 spray Each Nare Daily  . furosemide  40 mg Oral Daily  . levothyroxine  25 mcg Oral QAC breakfast  . mometasone-formoterol  2 puff Inhalation BID  . multivitamin with minerals  1 tablet Oral Daily  . oxybutynin  10 mg Oral Daily  . pantoprazole  40 mg Oral Daily  . potassium chloride SA  20 mEq Oral Daily  . traZODone  150 mg Oral QHS  . vancomycin  750 mg Intravenous Q12H   Continuous Infusions:  PRN Meds:.acetaminophen **OR** acetaminophen, albuterol, ALPRAZolam, dextromethorphan-guaiFENesin, diphenhydrAMINE, docusate sodium, hydrOXYzine, nitroGLYCERIN, ondansetron **OR** ondansetron (ZOFRAN) IV, polyethylene glycol, sodium chloride flush, traMADol  Micro Results Recent Results (from the past 240 hour(s))  Culture, blood (Routine X 2) w Reflex to ID Panel     Status: None (Preliminary result)   Collection Time: 09/10/15  1:50 AM  Result Value Ref Range Status   Specimen Description BLOOD LEFT ANTECUBITAL  Final   Special Requests BOTTLES DRAWN AEROBIC AND ANAEROBIC 5ML  Final   Culture   Final    NO GROWTH 1 DAY Performed at [facility]    Report Status PENDING  Incomplete  Urine culture     Status: Abnormal   Collection Time: 09/10/15  2:03 AM  Result Value Ref Range Status   Specimen Description URINE, RANDOM  Final   Special Requests NONE  Final   Culture (A)  Final    <10,000 COLONIES/mL INSIGNIFICANT GROWTH Performed at [facility]    Report Status 09/11/2015 FINAL  Final  MRSA PCR Screening     Status: None  Collection Time: 09/10/15  4:30 AM  Result Value Ref Range Status   MRSA by PCR NEGATIVE NEGATIVE Final    Comment:        The GeneXpert MRSA Assay (FDA approved for NASAL  specimens only), is one component of a comprehensive MRSA colonization surveillance program. It is not intended to diagnose MRSA infection nor to guide or monitor treatment for MRSA infections.   Culture, blood (Routine X 2) w Reflex to ID Panel     Status: None (Preliminary result)   Collection Time: 09/10/15  5:19 AM  Result Value Ref Range Status   Specimen Description BLOOD LEFT ARM  Final   Special Requests BOTTLES DRAWN AEROBIC AND ANAEROBIC 5CC  Final   Culture   Final    NO GROWTH 1 DAY Performed at [facility]    Report Status PENDING  Incomplete  Culture, expectorated sputum-assessment     Status: None   Collection Time: 09/11/15  3:06 AM  Result Value Ref Range Status   Specimen Description SPUTUM  Final   Special Requests Immunocompromised  Final   Sputum evaluation   Final    MICROSCOPIC FINDINGS SUGGEST THAT THIS SPECIMEN IS NOT REPRESENTATIVE OF LOWER RESPIRATORY SECRETIONS. PLEASE RECOLLECT. RESULT CALLED TO S LAMB @ 0411 ON 09/12/15 BY C DAVIS   Report Status 09/12/2015 FINAL  Final  Culture, sputum-assessment     Status: None   Collection Time: 09/11/15  4:26 AM  Result Value Ref Range Status   Specimen Description SPUTUM  Final   Special Requests NONE  Final   Sputum evaluation   Final    MICROSCOPIC FINDINGS SUGGEST THAT THIS SPECIMEN IS NOT REPRESENTATIVE OF LOWER RESPIRATORY SECRETIONS. PLEASE RECOLLECT. NOTIFIED K. BOOKER RN AT 0515 ON 08.16.17 BY SHUEA    Report Status 09/11/2015 FINAL  Final    Radiology Reports Dg Chest 2 View  Result Date: 09/10/2015 CLINICAL DATA:  82 y/o M; fever for 48 hours, weakness tonight, shortness of breath, and chest heaviness. EXAM: CHEST  2 VIEW COMPARISON:  Chest radiograph dated 09/03/2015. FINDINGS: Patchy opacities throughout the lungs bilaterally. Two lead pacemaker. Sternotomy wires are aligned and intact. Right central venous catheter with tip at the cavoatrial junction unchanged. No pneumothorax. No  definite pleural effusion. Stable cardiomegaly given differences in technique. IMPRESSION: Diffuse patchy pulmonary opacities probably represents moderate pulmonary edema. Underlying pneumonia is not excluded. Stable cardiomegaly. Electronically Signed   By: Kristine Garbe M.D.   On: 09/10/2015 00:37   Dg Chest 2 View  Result Date: 09/03/2015 CLINICAL DATA:  Dyspnea and productive cough for 10 days EXAM: CHEST  2 VIEW COMPARISON:  June 30, 2015 FINDINGS: The heart size and mediastinal contours are stable. Heart size is enlarged. Cardiac pacemaker is unchanged. A right central venous line is noted without change. There is interstitial pulmonary edema. There is no pleural effusion or focal pneumonia. The visualized skeletal structures are stable. IMPRESSION: Congestive heart failure. Electronically Signed   By: Abelardo Diesel M.D.   On: 09/03/2015 15:43   Ct Chest Wo Contrast  Result Date: 09/10/2015 CLINICAL DATA:  82 y/o M; weakness since this afternoon. Reported fevers for the last few days. History of lymphoma status post chemotherapy. EXAM: CT CHEST WITHOUT CONTRAST TECHNIQUE: Multidetector CT imaging of the chest was performed following the standard protocol without IV contrast. COMPARISON:  CT angiogram of the chest dated 10/28/2012. FINDINGS: Cardiovascular: Postsurgical changes related to coronary artery bypass graft surgery. Mild cardiomegaly. The ascending and wires  noted. Moderate coronary artery calcifications. Aortic valvular and mitral annular calcifications. Calcific atherosclerosis of the thoracic aorta. No aortic aneurysm. Mediastinum/Nodes: Prominent hilar and AP window lymph nodes are likely reactive. Lungs/Pleura: There are consolidations in the left lower and bilateral upper lobes. Additionally there is a background of diffuse interlobular septal thickening. Trace left-sided pleural effusion. Upper Abdomen: Mild calcific aortic atherosclerosis. Lucency in the liver caudate lobe  measuring 13 mm (2:127) and subcentimeter lucency in segment II are likely cysts. Moderate-sized hiatal hernia. Otherwise the upper abdomen is unremarkable. Musculoskeletal: Healed median sternotomy. No acute osseous abnormality is identified. IMPRESSION: 1. Consolidations in the left lower lobe and bilateral upper lobes probably represents multifocal pneumonia. 2. Background of septal thickening and diffuse hazy opacification is likely to represent an additional component of mild pulmonary edema. These results were called by telephone at the time of interpretation on 09/10/2015 at 2:18 am to Dr. Everlene Balls , who verbally acknowledged these results. Electronically Signed   By: Kristine Garbe M.D.   On: 09/10/2015 02:20    Time Spent in minutes  20   Faye Ramsay M.D on 09/12/2015 at 7:27 AM  Between 7am to 7pm - Pager - [phone]  After 7pm go to [email or web address] - password [facility]  [facility] -  Office  [phone]

## 2015-09-12 NOTE — Progress Notes (Signed)
I started caring for this patient at 1500.  I agree with the previous nurses assessment.

## 2015-09-13 LAB — CBC
HEMATOCRIT: 24.8 % — AB (ref 39.0–52.0)
HEMOGLOBIN: 8.6 g/dL — AB (ref 13.0–17.0)
MCH: 32 pg (ref 26.0–34.0)
MCHC: 34.7 g/dL (ref 30.0–36.0)
MCV: 92.2 fL (ref 78.0–100.0)
Platelets: 58 10*3/uL — ABNORMAL LOW (ref 150–400)
RBC: 2.69 MIL/uL — AB (ref 4.22–5.81)
RDW: 19.6 % — ABNORMAL HIGH (ref 11.5–15.5)
WBC: 0.9 10*3/uL — CL (ref 4.0–10.5)

## 2015-09-13 LAB — BASIC METABOLIC PANEL
Anion gap: 7 (ref 5–15)
BUN: 11 mg/dL (ref 6–20)
CHLORIDE: 97 mmol/L — AB (ref 101–111)
CO2: 32 mmol/L (ref 22–32)
CREATININE: 0.62 mg/dL (ref 0.61–1.24)
Calcium: 8.9 mg/dL (ref 8.9–10.3)
GFR calc Af Amer: >60 mL/min (ref >60)
GFR calc non Af Amer: >60 mL/min (ref >60)
Glucose, Bld: 131 mg/dL — ABNORMAL HIGH (ref 65–99)
POTASSIUM: 4 mmol/L (ref 3.5–5.1)
SODIUM: 136 mmol/L (ref 135–145)

## 2015-09-13 MED ORDER — LEVOFLOXACIN 750 MG PO TABS: 750.0000 mg | ORAL_TABLET | Freq: Every day | ORAL | Status: DC

## 2015-09-13 MED ORDER — HEPARIN SOD (PORK) LOCK FLUSH 100 UNIT/ML IV SOLN
500.0000 [IU] | INTRAVENOUS | Status: AC | PRN
  Administered 2015-09-13: 500 [IU]

## 2015-09-13 MED ORDER — LEVOFLOXACIN 750 MG PO TABS: 750.0000 mg | ORAL_TABLET | Freq: Every day | ORAL | 0 refills | Status: DC

## 2015-09-13 MED ORDER — ACYCLOVIR 5 % EX OINT: TOPICAL_OINTMENT | Freq: Four times a day (QID) | CUTANEOUS | 0 refills | Status: DC

## 2015-09-13 MED ORDER — LEVOFLOXACIN 750 MG PO TABS
750.0000 mg | ORAL_TABLET | Freq: Every day | ORAL | Status: DC
  Administered 2015-09-13: 750 mg via ORAL
  Filled 2015-09-13: qty 1

## 2015-09-13 NOTE — Discharge Summary (Signed)
Physician Discharge Summary  [name] [id number] DOB: 14-Feb-1932 DOA: 09/09/2015  PCP: Christinia Gully, MD  Admit date: 09/09/2015 Discharge date: 09/13/2015  Recommendations for Outpatient Follow-up:  1. Pt will need to follow up with PCP in 2-3 weeks post discharge 2. Please obtain BMP to evaluate electrolytes and kidney function 3. Please also check CBC to evaluate Hg and Hct levels 4. 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 fib not an adequate ideation recently treated with ciprofloxacin for UTI was brought to the ED as he was feeling very weak for past 2 weeks and had a near-syncopal episode. In the ED his hemoglobin was 6. Chest x-ray showed edema versus infiltrate. CT chest done showed multifocal pneumonia.   Subjective:   Patient reports nonproductive cough and weakness. Overall better.    Assessment  & Plan :   Principal Problem: HCAP (healthcare-associated pneumonia) - pt started on Empiric Vanco and cefepime. Follow cultures. Supportive care with Tylenol and antitussives. - plan to transition to oral Levaquin to complete therapy for PNA   Active Problems: Generalized weakness with Symptomatic pancytopenia  - Secondary to myelodysplasia. Receives transfusion as outpatient.  - has already received  units PRBC transfusion since admission with  IV Lasix in between transfusion.  - discussed with Dr. [name], reviewed blood counts, he recommended no Plt transfusion unless pt actively bleeding and no Neupogen unless  pt septic - Hg stable in the past 24 hours - family wants to take pt home, SNF recommended but pt and family declined offer - pt did have an episode of fall in the hospital and concern was for discharge home, pt and family made aware   Chronic diastolic CHF (congestive heart failure) ([place]) - on IV Lasix in between transfusion and placed on home dose daily Lasix  Hypothyroidism  - continue Synthroid  Chronic A. fib - Not a candidate for [facility] due to high risk bleed   Diabetes mellitus type 2 - resume home medical regimen   Lymphoma - outpatient follow up with Dr. [name] - PCT consulted as well for further goals of care discussions per pt's request   Complete heart block status post pacemaker - d/c tele   Obesity  - Body mass index is 34.02 kg/m.  Code Status : DO NOT RESUSCITATE  Family Communication  : Daughter at bedside  Barriers For Discharge : Active symptoms  Consults  :  Dr. [name] over the phone notified    Procedures/Studies: Dg Chest 2 View  Result Date: 09/10/2015 CLINICAL DATA:  82 y/o M; fever for 48 hours, weakness tonight, shortness of breath, and chest heaviness. EXAM: CHEST  2 VIEW COMPARISON:  Chest radiograph dated 09/03/2015. FINDINGS: Patchy opacities throughout the lungs bilaterally. Two lead pacemaker. Sternotomy wires are aligned and intact. Right central venous catheter with tip at the cavoatrial junction unchanged. No pneumothorax. No definite pleural effusion. Stable cardiomegaly given differences in technique. IMPRESSION: Diffuse patchy pulmonary opacities probably represents moderate pulmonary edema. Underlying pneumonia is not excluded. Stable cardiomegaly. Electronically Signed   By: Kristine Garbe M.D.   On: 09/10/2015 00:37  Dg Chest 2 View  Result Date: 09/03/2015 CLINICAL DATA:  Dyspnea and productive cough for 10 days EXAM: CHEST  2 VIEW COMPARISON:  June 30, 2015 FINDINGS: The heart size and mediastinal contours are  stable. Heart size is enlarged. Cardiac pacemaker is unchanged. A right central venous line is noted without change. There is interstitial pulmonary edema. There is no pleural effusion or focal pneumonia. The visualized skeletal structures are stable. IMPRESSION: Congestive heart failure. Electronically Signed   By: Abelardo Diesel M.D.   On: 09/03/2015 15:43   Dg Shoulder 1v Left  Result Date: 09/12/2015 CLINICAL DATA:  Slid from recliner in hospital room earlier today, LEFT knee and LEFT shoulder discomfort EXAM: LEFT SHOULDER - 1 VIEW COMPARISON:  Single portable [place]hey view at 1854 hours without priors for comparison FINDINGS: Osseous demineralization. AC joint alignment normal. No gross evidence of fracture, dislocation or bone destruction on single AP view. Visualized LEFT ribs intact. IMPRESSION: No acute osseous abnormalities. Electronically Signed   By: Lavonia Dana M.D.   On: 09/12/2015 20:19   Dg Knee 1-2 Views Left  Result Date: 09/12/2015 CLINICAL DATA:  Slid from recliner in hospital room earlier today, discomfort LEFT knee and LEFT shoulder EXAM: LEFT KNEE - 1-2 VIEW COMPARISON:  None FINDINGS: Osseous demineralization. Knee joint space narrowing and spur formation. Chondrocalcinosis question CPPD. No acute fracture, dislocation or bone destruction. Periosteal new bone at proximal fibula favor reflecting an old healed fracture. Minimal joint effusion. IMPRESSION: Degenerative changes and question CPPD LEFT knee. No acute bony abnormalities. Question old healed proximal fibular fracture. Electronically Signed   By: Lavonia Dana M.D.   On: 09/12/2015 20:17   Ct Chest Wo Contrast  Result Date: 09/10/2015 CLINICAL DATA:  82 y/o M; weakness since this afternoon. Reported fevers for the last few days. History of lymphoma status post chemotherapy. EXAM: CT CHEST WITHOUT CONTRAST TECHNIQUE: Multidetector CT imaging of the chest was performed following the standard protocol without IV contrast.  COMPARISON:  CT angiogram of the chest dated 10/28/2012. FINDINGS: Cardiovascular: Postsurgical changes related to coronary artery bypass graft surgery. Mild cardiomegaly. The ascending and wires noted. Moderate coronary artery calcifications. Aortic valvular and mitral annular calcifications. Calcific atherosclerosis of the thoracic aorta. No aortic aneurysm. Mediastinum/Nodes: Prominent hilar and AP window lymph nodes are likely reactive. Lungs/Pleura: There are consolidations in the left lower and bilateral upper lobes. Additionally there is a background of diffuse interlobular septal thickening. Trace left-sided pleural effusion. Upper Abdomen: Mild calcific aortic atherosclerosis. Lucency in the liver caudate lobe measuring 13 mm (2:127) and subcentimeter lucency in segment II are likely cysts. Moderate-sized hiatal hernia. Otherwise the upper abdomen is unremarkable. Musculoskeletal: Healed median sternotomy. No acute osseous abnormality is identified. IMPRESSION: 1. Consolidations in the left lower lobe and bilateral upper lobes probably represents multifocal pneumonia. 2. Background of septal thickening and diffuse hazy opacification is likely to represent an additional component of mild pulmonary edema. These results were called by telephone at the time of interpretation on 09/10/2015 at 2:18 am to Dr. Everlene Balls , who verbally acknowledged these results. Electronically Signed   By: Kristine Garbe M.D.   On: 09/10/2015 02:20     Discharge Exam: Vitals:   09/13/15 0157 09/13/15 0701  BP: 116/79 133/70  Pulse: 84 92  Resp: 18 20  Temp: 99.2 F (37.3 C) 97.9 F (36.6 C)   Vitals:   09/12/15 2150 09/13/15 0157 09/13/15 0701 09/13/15 0857  BP: 133/80 116/79 133/70   Pulse: 85  84 92   Resp: 18 18 20    Temp: 99 F (37.2 C) 99.2 F (37.3 C) 97.9 F (36.6 C)   TempSrc: Oral Oral Oral   SpO2: 97% 100% 100% 95%  Weight:   103.2 kg (227 lb 8 oz)   Height:        General: Pt is  alert, follows commands appropriately, not in acute distress Cardiovascular: Regular rate and rhythm, no rubs, no gallops Respiratory: Rhonchi at bases  Abdominal: Soft, non tender, non distended, bowel sounds +, no guarding  Discharge Instructions  Discharge Instructions    Diet - low sodium heart healthy    Complete by:  As directed   Increase activity slowly    Complete by:  As directed       Medication List    TAKE these medications   acyclovir ointment 5 % Commonly known as:  ZOVIRAX Apply topically 4 (four) times daily.   albuterol 108 (90 Base) MCG/ACT inhaler Commonly known as:  PROVENTIL HFA;VENTOLIN HFA Inhale 2 puffs into the lungs every 4 (four) hours as needed for wheezing or shortness of breath.   albuterol (2.5 MG/3ML) 0.083% nebulizer solution Commonly known as:  PROVENTIL Take 3 mLs (2.5 mg total) by nebulization every 4 (four) hours as needed for wheezing or shortness of breath.   ALPRAZolam 0.25 MG tablet Commonly known as:  XANAX Take 0.25 mg by mouth at bedtime as needed for anxiety or sleep.   chlorpheniramine 4 MG tablet Commonly known as:  CHLOR-TRIMETON Take 4 mg by mouth every 4 (four) hours as needed (drippy nose).   ciprofloxacin 500 MG tablet Commonly known as:  CIPRO Take 1 tablet (500 mg total) by mouth 2 (two) times daily.   dextromethorphan-guaiFENesin 30-600 MG 12hr tablet Commonly known as:  MUCINEX DM Take 1-2 tablets by mouth 2 (two) times daily as needed for cough.   docusate sodium 100 MG capsule Commonly known as:  COLACE Take 100 mg by mouth daily as needed for mild constipation or moderate constipation.   DULERA 200-5 MCG/ACT Aero Generic drug:  mometasone-formoterol Inhale 2 puffs into the lungs every 12 (twelve) hours.   famotidine 20 MG tablet Commonly known as:  PEPCID Take 20 mg by mouth at bedtime.   finasteride 5 MG tablet Commonly known as:  PROSCAR TAKE 1 TABLET BY MOUTH EVERY MORNING What changed:  See the  new instructions.   fluticasone 50 MCG/ACT nasal spray Commonly known as:  FLONASE 1-2 puffs each nostril once or twice daily for allergy What changed:  how much to take  how to take this  when to take this  additional instructions   furosemide 40 MG tablet Commonly known as:  LASIX TAKE 1 TABLET BY MOUTH EVERY DAY MAY TAKE 1 EXTRA TABLET DAILY IF NEEDED FOR SWELLING IN LEGS What changed:  See the new instructions.   hydrOXYzine 25 MG tablet Commonly known as:  ATARAX/VISTARIL Take 1-2 tabs by mouth every 4-6 hours as needed for itching   KLOR-CON M20 20 MEQ tablet Generic drug:  potassium chloride SA TAKE 1 TABLET DAILY What changed:  See the new instructions.   levofloxacin 750 MG tablet Commonly known as:  LEVAQUIN Take 1 tablet (750 mg total) by mouth daily. Start taking on:  09/14/2015   levothyroxine 25 MCG tablet Commonly known as:  SYNTHROID, LEVOTHROID TAKE 1 TABLET BY MOUTH EVERY MORNING BEFORE BREAKFAST What changed:  See the new instructions.   metFORMIN 500 MG tablet Commonly known  as:  GLUCOPHAGE TAKE 1 TABLET BY MOUTH TWICE A DAY WITH FOOD What changed:  See the new instructions.   multivitamin tablet Take 1 tablet by mouth every morning.   nitroGLYCERIN 0.4 MG SL tablet Commonly known as:  NITROSTAT Place 1 tablet (0.4 mg total) under the tongue every 5 (five) minutes as needed for chest pain.   omeprazole 20 MG capsule Commonly known as:  PRILOSEC Take 40 mg by mouth daily before breakfast.   OS-CAL 500 + D 500-200 MG-UNIT tablet Generic drug:  calcium-vitamin D Take 1 tablet by mouth 2 (two) times daily.   oxybutynin 10 MG 24 hr tablet Commonly known as:  DITROPAN-XL Take 10 mg by mouth every morning. Reported on 05/13/2015   OXYGEN 2 lpm with sleep   polyethylene glycol packet Commonly known as:  MIRALAX / GLYCOLAX Take 17 g by mouth daily as needed for moderate constipation or severe constipation.   traMADol 50 MG  tablet Commonly known as:  ULTRAM Take 1-1/2 tablet every 4 hours as needed for severe cough and pain   traZODone 150 MG tablet Commonly known as:  DESYREL Take 1 tablet (150 mg total) by mouth at bedtime.      Follow-up Information    Christinia Gully, MD .   Specialty:  Pulmonary Disease Contact information: [address] [place] [address] [phone]        [facility], MD .   Specialty:  Oncology Contact information: [place]. [place] [address] [phone]            The results of significant diagnostics from this hospitalization (including imaging, microbiology, ancillary and laboratory) are listed below for reference.     Microbiology: Recent Results (from the past 240 hour(s))  Culture, blood (Routine X 2) w Reflex to ID Panel     Status: None (Preliminary result)   Collection Time: 09/10/15  1:50 AM  Result Value Ref Range Status   Specimen Description BLOOD LEFT ANTECUBITAL  Final   Special Requests BOTTLES DRAWN AEROBIC AND ANAEROBIC 5ML  Final   Culture   Final    NO GROWTH 2 DAYS Performed at [facility]    Report Status PENDING  Incomplete  Urine culture     Status: Abnormal   Collection Time: 09/10/15  2:03 AM  Result Value Ref Range Status   Specimen Description URINE, RANDOM  Final   Special Requests NONE  Final   Culture (A)  Final    <10,000 COLONIES/mL INSIGNIFICANT GROWTH Performed at [facility]    Report Status 09/11/2015 FINAL  Final  MRSA PCR Screening     Status: None   Collection Time: 09/10/15  4:30 AM  Result Value Ref Range Status   MRSA by PCR NEGATIVE NEGATIVE Final    Comment:        The GeneXpert MRSA Assay (FDA approved for NASAL specimens only), is one component of a comprehensive MRSA colonization surveillance program. It is not intended to diagnose MRSA infection nor to guide or monitor treatment for MRSA infections.   Culture, blood (Routine X 2) w Reflex to ID Panel      Status: None (Preliminary result)   Collection Time: 09/10/15  5:19 AM  Result Value Ref Range Status   Specimen Description BLOOD LEFT ARM  Final   Special Requests BOTTLES DRAWN AEROBIC AND ANAEROBIC 5CC  Final   Culture   Final    NO GROWTH 2 DAYS Performed at [facility]    Report Status PENDING  Incomplete  Culture, expectorated sputum-assessment     Status: None   Collection Time: 09/11/15  3:06 AM  Result Value Ref Range Status   Specimen Description SPUTUM  Final   Special Requests Immunocompromised  Final   Sputum evaluation   Final    MICROSCOPIC FINDINGS SUGGEST THAT THIS SPECIMEN IS NOT REPRESENTATIVE OF LOWER RESPIRATORY SECRETIONS. PLEASE RECOLLECT. RESULT CALLED TO S [facility] @ 0411 ON 09/12/15 BY C DAVIS   Report Status 09/12/2015 FINAL  Final  Culture, sputum-assessment     Status: None   Collection Time: 09/11/15  4:26 AM  Result Value Ref Range Status   Specimen Description SPUTUM  Final   Special Requests NONE  Final   Sputum evaluation   Final    MICROSCOPIC FINDINGS SUGGEST THAT THIS SPECIMEN IS NOT REPRESENTATIVE OF LOWER RESPIRATORY SECRETIONS. PLEASE RECOLLECT. NOTIFIED K. BOOKER RN AT 0515 ON 08.16.17 BY SHUEA    Report Status 09/11/2015 FINAL  Final     Labs: Basic Metabolic Panel:  Recent Labs Lab 09/10/15 0100 09/10/15 0107 09/10/15 0627 09/11/15 0534 09/12/15 0440 09/13/15 0415  NA 132* 133*  --  133* 133* 136  K 3.9 4.0  --  3.6 3.5 4.0  CL 97* 93*  --  96* 96* 97*  CO2 27  --   --  29 30 32  GLUCOSE 145* 144*  --  142* 154* 131*  BUN 17 14  --  18 14 11   CREATININE 0.69 0.70  --  0.73 0.64 0.62  CALCIUM 9.2  --   --  8.7* 8.5* 8.9  MG 1.5*  --  1.9  --   --   --    Liver Function Tests:  Recent Labs Lab 09/10/15 0100  AST 31  ALT 46  ALKPHOS 81  BILITOT 0.7  PROT 6.2*  ALBUMIN 2.9*    Recent Labs Lab 09/10/15 0100  LIPASE 17   CBC:  Recent Labs Lab 09/10/15 0100 09/10/15 0107 09/11/15 0534 09/12/15 0440  09/13/15 0415  WBC 0.8*  --  0.8* 0.7* 0.9*  NEUTROABS 0.5*  --   --   --   --   HGB 6.3* 6.5* 7.4* 7.9* 8.6*  HCT 18.6* 19.0* 21.5* 22.7* 24.8*  MCV 96.9  --  91.5 91.2 92.2  PLT 49*  --  40* 41* 58*    BNP (last 3 results)  Recent Labs  01/18/15 0940 09/10/15 0100  BNP 668.7* 253.7*    ProBNP (last 3 results)  Recent Labs  05/13/15 1043 07/08/15 1520 09/03/15 1445  PROBNP 271.0* 447.0* 535.0*     SIGNED: Time coordinating discharge: 30 minutes  MAGICK-Derald Lorge, MD  [facility] 09/13/2015, 12:08 PM Pager [phone]  If 7PM-7AM, please contact night-coverage [email or web address] Password [email or web address]

## 2015-09-13 NOTE — Discharge Instructions (Signed)

## 2015-09-13 NOTE — Progress Notes (Signed)
Pt's daughter selected [facility] and states forget the Palliative Care part. Pt want to go home. Referral given to [facility].

## 2015-09-13 NOTE — Progress Notes (Signed)
Physical Therapy Treatment Patient Details Name: [name] MRN: [id number] DOB: 1933-01-04 Today's Date: 09/13/2015    History of Present Illness 82 year old male with PMHx of DM, Hypothyroidism, dCHF, AV block s/p PPM, Lymphoma; adm with weakness and near syncopal episode    PT Comments    Pt c/o L shoulder pain stating he was sitting on EOB last evening when he slid to floor because of a slick bed sheet under his foot.  He attempted to catch himself but doing so "stained" his L shoulder.  X rays are normal.  Assisted with amb a greater distance however noted increased DOE and required X 3 sitting rest breaks.  Pt stated he feels, "tiered'.  Discussed D/C plans and daughter stated she can provide even more assist and even stay over night initially.    Follow Up Recommendations  Home health PT;SNF (pending medical and family support level)   Equipment Recommendations  None recommended by PT    Recommendations for Other Services       Precautions / Restrictions Precautions Precautions: Fall Precaution Comments: fluid restrictions Restrictions Weight Bearing Restrictions: No    Mobility  Bed Mobility Overal bed mobility: Needs Assistance           Sit to supine: Supervision;Min guard   General bed mobility comments: assist with covers and slight positioning  Transfers Overall transfer level: Needs assistance Equipment used: None Transfers: Sit to/from Stand Sit to Stand: Supervision;Min guard         General transfer comment: good safety tech and use of hands to steady self  Ambulation/Gait Ambulation/Gait assistance: Supervision;Min guard Ambulation Distance (Feet): 84 Feet (X 3 sitting breaks needed) Assistive device: Rolling walker (2 wheeled) Gait Pattern/deviations: Step-to pattern;Step-through pattern Gait velocity: decreased but functional   General Gait Details: Increased amb distance however required X 3 sitting rest breaks due to MAX c/o  fatigu and noted dyspnea.  RA 91% and HR 112.     Stairs            Wheelchair Mobility    Modified Rankin (Stroke Patients Only)       Balance                                    Cognition Arousal/Alertness: Awake/alert Behavior During Therapy: WFL for tasks assessed/performed Overall Cognitive Status: Within Functional Limits for tasks assessed                      Exercises      General Comments        Pertinent Vitals/Pain Pain Assessment: Faces Faces Pain Scale: Hurts little more Pain Location: L shouler Pain Descriptors / Indicators: Sore Pain Intervention(s): Monitored during session;Repositioned    Home Living                      Prior Function            PT Goals (current goals can now be found in the care plan section) Progress towards PT goals: Progressing toward goals    Frequency  Min 3X/week    PT Plan Current plan remains appropriate    Co-evaluation             End of Session Equipment Utilized During Treatment: Gait belt Activity Tolerance: Patient tolerated treatment well Patient left: in bed;with call bell/phone within reach;with bed alarm set;with  family/visitor present     Time: [id number] PT Time Calculation (min) (ACUTE ONLY): 24 min  Charges:  $Gait Training: 8-22 mins $Therapeutic Activity: 8-22 mins                    G Codes:      Rica Koyanagi  PTA [facility]  Acute  Rehab Pager      [phone]

## 2015-09-13 NOTE — Progress Notes (Signed)
Palliative Medicine consult noted. Due to high referral volume, there may be a delay seeing this patient. Please call the Palliative Medicine Team office at [phone] if recommendations are needed in the interim.  Thank you for inviting [place] to see this patient.  Marjie Skiff Regis Wiland, RN, BSN, [facility] 09/13/2015 10:22 AM Cell [phone] 8:00-4:00 Monday-Friday Office [phone]

## 2015-09-15 LAB — CULTURE, BLOOD (ROUTINE X 2)
Culture: NO GROWTH
Culture: NO GROWTH

## 2015-09-20 ENCOUNTER — Telehealth: Payer: Self-pay | Admitting: Internal Medicine

## 2015-09-20 MED ORDER — FLUCONAZOLE 100 MG PO TABS: 100.0000 mg | ORAL_TABLET | Freq: Every day | ORAL | 0 refills | Status: DC

## 2015-09-20 NOTE — Telephone Encounter (Signed)
Diflucan 100mg daily x 7 days 

## 2015-09-20 NOTE — Telephone Encounter (Signed)
Last ov with MW 09/03/15 Next ov with MW 10/08/15  Please remember to go to the lab and x-ray department downstairs for your tests - we will call you with the results when they are available.  Cipro 500mg  twice daily x 7 days and be sure you have follow up with your urologist  Keep your scheduled appt with me in September - call sooner if needed Add:  Encouraged to use one extra  furosemide until feet swelling resolved  As per med cal instructions  Called spoke with pt's daughter. She is requesting a medication to help with pt's yeast. He is currently using OTC creams and it has not seemed to help the yeast issue. He completed Levaquin on 09/17/15. Verified pharmacy as [facility] on [address]. I explained to her that I would send a message to MW and return her call with his recs. She voiced understanding and had no further questions.  MW please advise

## 2015-09-20 NOTE — Telephone Encounter (Signed)
Called spoke with patient's daughter [name] and advised of MW's recommendations as stated below Rx sent to verified pharmacy Nothing further needed; will sign off

## 2015-09-23 ENCOUNTER — Encounter: Payer: Self-pay | Admitting: Nurse Practitioner

## 2015-09-23 ENCOUNTER — Other Ambulatory Visit: Payer: Self-pay | Admitting: Nurse Practitioner

## 2015-09-23 ENCOUNTER — Telehealth: Payer: Self-pay | Admitting: *Deleted

## 2015-09-23 ENCOUNTER — Ambulatory Visit (HOSPITAL_BASED_OUTPATIENT_CLINIC_OR_DEPARTMENT_OTHER): Payer: Medicare Other | Admitting: Nurse Practitioner

## 2015-09-23 ENCOUNTER — Ambulatory Visit (HOSPITAL_BASED_OUTPATIENT_CLINIC_OR_DEPARTMENT_OTHER): Payer: Medicare Other

## 2015-09-23 ENCOUNTER — Telehealth: Payer: Self-pay

## 2015-09-23 VITALS — BP 112/62 | HR 99 | Temp 98.9°F | Resp 16 | Ht 71.0 in

## 2015-09-23 DIAGNOSIS — D469 Myelodysplastic syndrome, unspecified: Secondary | ICD-10-CM

## 2015-09-23 DIAGNOSIS — D61818 Other pancytopenia: Secondary | ICD-10-CM

## 2015-09-23 DIAGNOSIS — Z452 Encounter for adjustment and management of vascular access device: Secondary | ICD-10-CM | POA: Diagnosis present

## 2015-09-23 DIAGNOSIS — D649 Anemia, unspecified: Secondary | ICD-10-CM

## 2015-09-23 DIAGNOSIS — Z95828 Presence of other vascular implants and grafts: Secondary | ICD-10-CM

## 2015-09-23 LAB — COMPREHENSIVE METABOLIC PANEL
ALT: 45 U/L (ref 0–55)
ANION GAP: 12 meq/L — AB (ref 3–11)
AST: 35 U/L — ABNORMAL HIGH (ref 5–34)
Albumin: 2.6 g/dL — ABNORMAL LOW (ref 3.5–5.0)
Alkaline Phosphatase: 93 U/L (ref 40–150)
BILIRUBIN TOTAL: 0.66 mg/dL (ref 0.20–1.20)
BUN: 14.6 mg/dL (ref 7.0–26.0)
CO2: 26 meq/L (ref 22–29)
Calcium: 9.2 mg/dL (ref 8.4–10.4)
Chloride: 99 mEq/L (ref 98–109)
Creatinine: 0.8 mg/dL (ref 0.7–1.3)
EGFR: 86 mL/min/{1.73_m2} — AB (ref >90)
GLUCOSE: 149 mg/dL — AB (ref 70–140)
POTASSIUM: 4 meq/L (ref 3.5–5.1)
SODIUM: 136 meq/L (ref 136–145)
Total Protein: 6.7 g/dL (ref 6.4–8.3)

## 2015-09-23 LAB — CBC WITH DIFFERENTIAL/PLATELET
BASO%: 0 % (ref 0.0–2.0)
BASOS ABS: 0 10*3/uL (ref 0.0–0.1)
EOS ABS: 0 10*3/uL (ref 0.0–0.5)
EOS%: 0.9 % (ref 0.0–7.0)
HCT: 23 % — ABNORMAL LOW (ref 38.4–49.9)
HGB: 7.8 g/dL — ABNORMAL LOW (ref 13.0–17.1)
LYMPH%: 45 % (ref 14.0–49.0)
MCH: 32.1 pg (ref 27.2–33.4)
MCHC: 33.9 g/dL (ref 32.0–36.0)
MCV: 94.7 fL (ref 79.3–98.0)
MONO#: 0 10*3/uL — ABNORMAL LOW (ref 0.1–0.9)
MONO%: 1.8 % (ref 0.0–14.0)
NEUT#: 0.6 10*3/uL — ABNORMAL LOW (ref 1.5–6.5)
NEUT%: 52.3 % (ref 39.0–75.0)
PLATELETS: 52 10*3/uL — AB (ref 140–400)
RBC: 2.43 10*6/uL — AB (ref 4.20–5.82)
RDW: 21 % — AB (ref 11.0–14.6)
WBC: 1.1 10*3/uL — ABNORMAL LOW (ref 4.0–10.3)
lymph#: 0.5 10*3/uL — ABNORMAL LOW (ref 0.9–3.3)
nRBC: 2 % — ABNORMAL HIGH (ref 0–0)

## 2015-09-23 MED ORDER — SODIUM CHLORIDE 0.9 % IJ SOLN
10.0000 mL | INTRAMUSCULAR | Status: DC | PRN
  Administered 2015-09-23: 10 mL via INTRAVENOUS
  Filled 2015-09-23: qty 10

## 2015-09-23 NOTE — Assessment & Plan Note (Signed)
Patient is currently undergoing observation only.  Patient presented to the [facility] today with complaint of increased fatigue and shortness of breath with exertion.  He was found to be increasingly anemic today; will return for sleep morning to receive 2 units packed red blood cell transfusion.  Agent was scheduled to return for labs, flush, and possible blood products on 09/25/2015; but will cancel this appointment.  Since patient was seen today.  Patient is next scheduled for labs, flush, visit, and blood products on 10/06/2015.   He knows to call in the interim with any new worries or concerns whatsoever.

## 2015-09-23 NOTE — Progress Notes (Signed)
SYMPTOM MANAGEMENT CLINIC    Chief Complaint: Anemia  HPI:  [name] 82 y.o. male diagnosed with MDS.  Currently undergoing observation only.  Patient received transfusional blood product support on an as-needed basis.    No history exists.    Review of Systems  Constitutional: Positive for malaise/fatigue.  Respiratory: Positive for shortness of breath.   Neurological: Positive for weakness.  All other systems reviewed and are negative.   Past Medical History:  Diagnosis Date  . Paroxysmal atrial fibrillation ([place]) 03/16/11   diagnosed by PPM interrogation  . Second degree Mobitz II AV block 12/11/08   with transient syncope, resolved s/p PPM    Past Surgical History:  Procedure Laterality Date  . BI-VENTRICULAR PACEMAKER UPGRADE N/A 12/29/2012   Procedure: BI-VENTRICULAR PACEMAKER UPGRADE;  Surgeon: Evans Lance, MD;  Location: [facility] CATH LAB;  Service: Cardiovascular;  Laterality: N/A;  . INSERT / REPLACE / REMOVE PACEMAKER  12/29/2012  . LYMPH NODE BIOPSY  04/2005   groin  . PACEMAKER INSERTION  12/11/08   MDT implanted by Dr Rayann Heman  . PACEMAKER LEAD REMOVAL Left 11/04/2012   Procedure: PACEMAKER LEAD REMOVAL;  Surgeon: Evans Lance, MD;  Location: [place];  Service: Cardiovascular;  Laterality: Left;  . PENILE PROSTHESIS IMPLANT     and removal  . PERICARDIECTOMY  2000  . STERNOTOMY  2000  . TEE WITHOUT CARDIOVERSION N/A 10/31/2012   Procedure: TRANSESOPHAGEAL ECHOCARDIOGRAM (TEE);  Surgeon: Lelon Perla, MD;  Location: [facility] ENDOSCOPY;  Service: Cardiovascular;  Laterality: N/A;  . TEMPORARY PACEMAKER INSERTION Left 11/04/2012   Procedure: TEMPORARY PACEMAKER INSERTION;  Surgeon: Evans Lance, MD;  Location: [place];  Service: Cardiovascular;  Laterality: Left;    has Lactic acidosis; Pancytopenia ([place]); MDS (myelodysplastic syndrome) ([place]); Sepsis due to pneumonia ([place]); Lobar pneumonia, unspecified organism ([place]); Neutropenia ([place]); Thrombocytopenia  ([place]); Anemia of chronic disease; Pacemaker; Chronic diastolic CHF (congestive heart failure) ([place]); Fungal infection; Urinary frequency; Cough variant asthma; Bronchitis, chronic obstructive w acute bronchitis ([place]); Dyspnea; Port catheter in place; Chest pain; Bronchitis; and HCAP (healthcare-associated pneumonia) on his problem list.    has No Known Allergies.    Medication List       Accurate as of 09/23/15  1:55 PM. Always use your most recent med list.          acyclovir ointment 5 % Commonly known as:  ZOVIRAX Apply topically 4 (four) times daily.   albuterol 108 (90 Base) MCG/ACT inhaler Commonly known as:  PROVENTIL HFA;VENTOLIN HFA Inhale 2 puffs into the lungs every 4 (four) hours as needed for wheezing or shortness of breath.   albuterol (2.5 MG/3ML) 0.083% nebulizer solution Commonly known as:  PROVENTIL Take 3 mLs (2.5 mg total) by nebulization every 4 (four) hours as needed for wheezing or shortness of breath.   ALPRAZolam 0.25 MG tablet Commonly known as:  XANAX Take 0.25 mg by mouth at bedtime as needed for anxiety or sleep.   chlorpheniramine 4 MG tablet Commonly known as:  CHLOR-TRIMETON Take 4 mg by mouth every 4 (four) hours as needed (drippy nose).   ciprofloxacin 500 MG tablet Commonly known as:  CIPRO Take 1 tablet (500 mg total) by mouth 2 (two) times daily.   dextromethorphan-guaiFENesin 30-600 MG 12hr tablet Commonly known as:  MUCINEX DM Take 1-2 tablets by mouth 2 (two) times daily as needed for cough.   docusate sodium 100 MG capsule Commonly known as:  COLACE Take 100 mg by mouth  daily as needed for mild constipation or moderate constipation.   DULERA 200-5 MCG/ACT Aero Generic drug:  mometasone-formoterol Inhale 2 puffs into the lungs every 12 (twelve) hours.   famotidine 20 MG tablet Commonly known as:  PEPCID Take 20 mg by mouth at bedtime.   finasteride 5 MG tablet Commonly known as:  PROSCAR TAKE 1 TABLET BY MOUTH EVERY  MORNING   fluconazole 100 MG tablet Commonly known as:  DIFLUCAN Take 1 tablet (100 mg total) by mouth daily.   fluticasone 50 MCG/ACT nasal spray Commonly known as:  FLONASE 1-2 puffs each nostril once or twice daily for allergy   furosemide 40 MG tablet Commonly known as:  LASIX TAKE 1 TABLET BY MOUTH EVERY DAY MAY TAKE 1 EXTRA TABLET DAILY IF NEEDED FOR SWELLING IN LEGS   hydrOXYzine 25 MG tablet Commonly known as:  ATARAX/VISTARIL Take 1-2 tabs by mouth every 4-6 hours as needed for itching   KLOR-CON M20 20 MEQ tablet Generic drug:  potassium chloride SA TAKE 1 TABLET DAILY   levofloxacin 750 MG tablet Commonly known as:  LEVAQUIN Take 1 tablet (750 mg total) by mouth daily.   levothyroxine 25 MCG tablet Commonly known as:  SYNTHROID, LEVOTHROID TAKE 1 TABLET BY MOUTH EVERY MORNING BEFORE BREAKFAST   metFORMIN 500 MG tablet Commonly known as:  GLUCOPHAGE TAKE 1 TABLET BY MOUTH TWICE A DAY WITH FOOD   multivitamin tablet Take 1 tablet by mouth every morning.   nitroGLYCERIN 0.4 MG SL tablet Commonly known as:  NITROSTAT Place 1 tablet (0.4 mg total) under the tongue every 5 (five) minutes as needed for chest pain.   omeprazole 20 MG capsule Commonly known as:  PRILOSEC Take 40 mg by mouth daily before breakfast.   OS-CAL 500 + D 500-200 MG-UNIT tablet Generic drug:  calcium-vitamin D Take 1 tablet by mouth 2 (two) times daily.   oxybutynin 10 MG 24 hr tablet Commonly known as:  DITROPAN-XL Take 10 mg by mouth every morning. Reported on 05/13/2015   OXYGEN 2 lpm with sleep   polyethylene glycol packet Commonly known as:  MIRALAX / GLYCOLAX Take 17 g by mouth daily as needed for moderate constipation or severe constipation.   traMADol 50 MG tablet Commonly known as:  ULTRAM Take 1-1/2 tablet every 4 hours as needed for severe cough and pain   traZODone 150 MG tablet Commonly known as:  DESYREL Take 1 tablet (150 mg total) by mouth at bedtime.         PHYSICAL EXAMINATION  Oncology Vitals 09/23/2015 09/13/2015  Height 180 cm -  Weight (No Data) -  Weight (lbs) (No Data) -  BMI (kg/m2) - -  Temp 98.9 -  Pulse 99 -  Resp 16 -  Resp (Historical as of 08/27/11) - -  SpO2 94 95  BSA (m2) - -   BP Readings from Last 2 Encounters:  09/23/15 112/62  09/13/15 133/70    Physical Exam  Constitutional: He is oriented to person, place, and time. Vital signs are normal. He appears dehydrated. He appears unhealthy.  HENT:  Head: Normocephalic and atraumatic.  Patient appears mildly dehydrated with his lips dry.  He was encouraged to push fluids at home is much as possible.  Eyes: Conjunctivae and EOM are normal. Pupils are equal, round, and reactive to light.  Neck: Normal range of motion.  Pulmonary/Chest: Effort normal. No respiratory distress.  Musculoskeletal: Normal range of motion.  Neurological: He is alert and oriented to person,  place, and time. Gait normal.  Skin: Skin is warm and dry. There is pallor.  Psychiatric: Affect normal.  Nursing note and vitals reviewed.   LABORATORY DATA:. Appointment on 09/23/2015  Component Date Value Ref Range Status  . WBC 09/23/2015 1.1* 4.0 - 10.3 10e3/uL Final  . NEUT# 09/23/2015 0.6* 1.5 - 6.5 10e3/uL Final  . HGB 09/23/2015 7.8* 13.0 - 17.1 g/dL Final  . HCT 09/23/2015 23.0* 38.4 - 49.9 % Final  . Platelets 09/23/2015 52* 140 - 400 10e3/uL Final  . MCV 09/23/2015 94.7  79.3 - 98.0 fL Final  . MCH 09/23/2015 32.1  27.2 - 33.4 pg Final  . MCHC 09/23/2015 33.9  32.0 - 36.0 g/dL Final  . RBC 09/23/2015 2.43* 4.20 - 5.82 10e6/uL Final  . RDW 09/23/2015 21.0* 11.0 - 14.6 % Final  . lymph# 09/23/2015 0.5* 0.9 - 3.3 10e3/uL Final  . MONO# 09/23/2015 0.0* 0.1 - 0.9 10e3/uL Final  . Eosinophils Absolute 09/23/2015 0.0  0.0 - 0.5 10e3/uL Final  . Basophils Absolute 09/23/2015 0.0  0.0 - 0.1 10e3/uL Final  . NEUT% 09/23/2015 52.3  39.0 - 75.0 % Final  . LYMPH% 09/23/2015 45.0  14.0 - 49.0 %  Final  . MONO% 09/23/2015 1.8  0.0 - 14.0 % Final  . EOS% 09/23/2015 0.9  0.0 - 7.0 % Final  . BASO% 09/23/2015 0.0  0.0 - 2.0 % Final  . nRBC 09/23/2015 2* 0 - 0 % Final  . Sodium 09/23/2015 136  136 - 145 mEq/L Final  . Potassium 09/23/2015 4.0  3.5 - 5.1 mEq/L Final  . Chloride 09/23/2015 99  98 - 109 mEq/L Final  . CO2 09/23/2015 26  22 - 29 mEq/L Final  . Glucose 09/23/2015 149* 70 - 140 mg/dl Final  . BUN 09/23/2015 14.6  7.0 - 26.0 mg/dL Final  . Creatinine 09/23/2015 0.8  0.7 - 1.3 mg/dL Final  . Total Bilirubin 09/23/2015 0.66  0.20 - 1.20 mg/dL Final  . Alkaline Phosphatase 09/23/2015 93  40 - 150 U/L Final  . AST 09/23/2015 35* 5 - 34 U/L Final  . ALT 09/23/2015 45  0 - 55 U/L Final  . Total Protein 09/23/2015 6.7  6.4 - 8.3 g/dL Final  . Albumin 09/23/2015 2.6* 3.5 - 5.0 g/dL Final  . Calcium 09/23/2015 9.2  8.4 - 10.4 mg/dL Final  . Anion Gap 09/23/2015 12* 3 - 11 mEq/L Final  . EGFR 09/23/2015 86* >90 ml/min/1.73 m2 Final    RADIOGRAPHIC STUDIES: No results found.  ASSESSMENT/PLAN:    Pancytopenia [facility]) Patient presents to the [place] today with complaint of increased fatigue and increased shortness of breath with any exertion.  Denies any active bleeding.  Denies any recent fevers or chills.  Exam today reveals patient appears fatigued,/slightly weak; and pale.  No obvious shortness of breath on exam.  Vital signs were stable and O2 sat was 94% on room air.  Patient was afebrile.  Blood counts obtained today revealed WBC 1.1, ANC 0.6, hemoglobin has decreased, and 52.  Once again reviewed all neutropenia guidelines with the patient and patient's daughter.    Dr. [name] most recent notes state that patient should receive blood if hemoglobin is less than 9.  Patient will return at 8:00 in the morning to the sickle cell unit to receive 2 units packed red blood cells.  Patient will receive Lasix in between his 2 units of blood tomorrow-Since patient has a known  history of CHF.  Patient was advised to go directly to the emergency department for any worsening symptoms whatsoever.  Overnight.  Also,-patient will cancel his appointments that were previously scheduled for Wednesday, 09/25/2015-since patient was seen today.  MDS (myelodysplastic syndrome) ([place]) Patient is currently undergoing observation only.  Patient presented to the [place] today with complaint of increased fatigue and shortness of breath with exertion.  He was found to be increasingly anemic today; will return for sleep morning to receive 2 units packed red blood cell transfusion.  Agent was scheduled to return for labs, flush, and possible blood products on 09/25/2015; but will cancel this appointment.  Since patient was seen today.  Patient is next scheduled for labs, flush, visit, and blood products on 10/06/2015.   He knows to call in the interim with any new worries or concerns whatsoever.   Patient stated understanding of all instructions; and was in agreement with this plan of care. The patient knows to call the clinic with any problems, questions or concerns.   Total time spent with patient was 25 minutes;  with greater than 75 percent of that time spent in face to face counseling regarding patient's symptoms,  and coordination of care and follow up.  Disclaimer:This dictation was prepared with Dragon/digital dictation along with [employer]. Any transcriptional errors that result from this process are unintentional.  Drue Second, NP 09/23/2015

## 2015-09-23 NOTE — Telephone Encounter (Signed)
Called patient's daughter after voicemail received requesting "lab appointment today instead of Wednesday.  He's symptomatic as when he needs blood.  He is pale, extremely weak, short of breath." Will schedule lab and [facility] Visit.

## 2015-09-23 NOTE — Assessment & Plan Note (Addendum)
Patient presents to the [facility] today with complaint of increased fatigue and increased shortness of breath with any exertion.  Denies any active bleeding.  Denies any recent fevers or chills.  Exam today reveals patient appears fatigued,/slightly weak; and pale.  No obvious shortness of breath on exam.  Vital signs were stable and O2 sat was 94% on room air.  Patient was afebrile.  Blood counts obtained today revealed WBC 1.1, ANC 0.6, hemoglobin has decreased, and 52.  Once again reviewed all neutropenia guidelines with the patient and patient's daughter.    Dr. [name] most recent notes state that patient should receive blood if hemoglobin is less than 9.  Patient will return at 8:00 in the morning to the sickle cell unit to receive 2 units packed red blood cells.  Patient will receive Lasix in between his 2 units of blood tomorrow-Since patient has a known history of CHF.    Patient was advised to go directly to the emergency department for any worsening symptoms whatsoever.  Overnight.  Also,-patient will cancel his appointments that were previously scheduled for Wednesday, 09/25/2015-since patient was seen today.

## 2015-09-23 NOTE — Telephone Encounter (Signed)
Called pt's daughter to inform her that pt's appts for Wednesday have been cancelled and rescheduled fro 9/13.  Daughter verbalized understanding

## 2015-09-24 ENCOUNTER — Ambulatory Visit (HOSPITAL_COMMUNITY)
Admission: RE | Admit: 2015-09-24 | Discharge: 2015-09-24 | Disposition: A | Discharge status: Home / Self Care | Payer: Medicare Other | Source: Ambulatory Visit | Attending: Internal Medicine | Admitting: Internal Medicine

## 2015-09-24 DIAGNOSIS — D469 Myelodysplastic syndrome, unspecified: Secondary | ICD-10-CM

## 2015-09-24 DIAGNOSIS — D649 Anemia, unspecified: Secondary | ICD-10-CM

## 2015-09-24 LAB — PREPARE RBC (CROSSMATCH)

## 2015-09-24 MED ORDER — FUROSEMIDE 10 MG/ML IJ SOLN
20.0000 mg | Freq: Once | INTRAMUSCULAR | Status: AC
  Administered 2015-09-24: 20 mg via INTRAVENOUS
  Filled 2015-09-24: qty 2

## 2015-09-24 MED ORDER — DIPHENHYDRAMINE HCL 25 MG PO CAPS: 25.0000 mg | ORAL_CAPSULE | Freq: Once | ORAL | Status: DC

## 2015-09-24 MED ORDER — SODIUM CHLORIDE 0.9% FLUSH
10.0000 mL | INTRAVENOUS | Status: AC | PRN
  Administered 2015-09-24: 10 mL

## 2015-09-24 MED ORDER — SODIUM CHLORIDE 0.9 % IV SOLN
250.0000 mL | Freq: Once | INTRAVENOUS | Status: AC
  Administered 2015-09-24: 250 mL via INTRAVENOUS

## 2015-09-24 MED ORDER — HEPARIN SOD (PORK) LOCK FLUSH 100 UNIT/ML IV SOLN: 250.0000 [IU] | INTRAVENOUS | Status: DC | PRN

## 2015-09-24 MED ORDER — HEPARIN SOD (PORK) LOCK FLUSH 100 UNIT/ML IV SOLN
500.0000 [IU] | Freq: Every day | INTRAVENOUS | Status: AC | PRN
  Administered 2015-09-24: 500 [IU]
  Filled 2015-09-24: qty 5

## 2015-09-24 MED ORDER — SODIUM CHLORIDE 0.9% FLUSH: 3.0000 mL | INTRAVENOUS | Status: DC | PRN

## 2015-09-24 MED ORDER — ACETAMINOPHEN 325 MG PO TABS: 650.0000 mg | ORAL_TABLET | Freq: Once | ORAL | Status: DC

## 2015-09-24 NOTE — Discharge Instructions (Signed)
Blood Transfusion, Care After  These instructions give you information about caring for yourself after your procedure. Your doctor may also give you more specific instructions. Call your doctor if you have any problems or questions after your procedure.   HOME CARE   Take medicines only as told by your doctor. Ask your doctor if you can take an over-the-counter pain reliever if you have a fever or headache a day or two after your procedure.   Return to your normal activities as told by your doctor.  GET HELP IF:    You develop redness or irritation at your IV site.   You have a fever, chills, or a headache that does not go away.   Your pee (urine) is darker than normal.   Your urine turns:    Pink.    Red.    Brown.   The white part of your eye turns yellow (jaundice).   You feel weak after doing your normal activities.  GET HELP RIGHT AWAY IF:    You have trouble breathing.   You have fever and chills and you also have:    Anxiety.    Chest or back pain.    Flushed or pink skin.    Clammy or sweaty skin.    A fast heartbeat.    A sick feeling in your stomach (nausea).     This information is not intended to replace advice given to you by your health care provider. Make sure you discuss any questions you have with your health care provider.     Document Released: 02/02/2014 Document Reviewed: 02/02/2014  Elsevier Interactive Patient Education 2016 Elsevier Inc.

## 2015-09-24 NOTE — Progress Notes (Signed)
Patient ID: [name], male   DOB: [birth year], 82 y.o.   MRN: [id number] Provider: Drue Second NP  Associated Diagnosis:MDS (myelodysplastic syndrome) ([place]) (D46.9); Symptomatic anemia (D64.9  Procedure: Transfusion of 2 units PRBC's as ordered via port a cath  Patient tolerated transfusion without reaction. Lasix given between the units and diuresis noted. Went over discharge instructions and copy given to patient . Alert, oriented and ambulatory to wheelchair at time of discharge. Discharged to home with daughter.

## 2015-09-25 ENCOUNTER — Other Ambulatory Visit: Payer: Medicare Other

## 2015-09-25 LAB — TYPE AND SCREEN
ABO/RH(D): A POS
Antibody Screen: NEGATIVE
UNIT DIVISION: 0
Unit division: 0

## 2015-09-27 ENCOUNTER — Other Ambulatory Visit: Payer: Self-pay

## 2015-09-27 ENCOUNTER — Ambulatory Visit (HOSPITAL_COMMUNITY)
Admission: RE | Admit: 2015-09-27 | Discharge: 2015-09-27 | Disposition: A | Discharge status: Home / Self Care | Payer: Medicare Other | Source: Ambulatory Visit | Attending: Oncology | Admitting: Oncology

## 2015-09-27 DIAGNOSIS — D469 Myelodysplastic syndrome, unspecified: Secondary | ICD-10-CM

## 2015-09-27 NOTE — Patient Outreach (Signed)
[facility]) Care Management  09/27/2015  [name] [birth year] [id number]  Referral Date:  09/27/2015 Source:  [facility] Issue:  Filled new prescriptions? No Know exactly which meds to take? No Patient is eligible for [facility] services.   Outreach call #1 to patient.  Contact not reached.  RN CM left HIPAA compliant voice message with name and number.  RN CM scheduled for next outreach call within one week.   [name], BSN, RN, [place] Management Care Management Coordinator [phone] Direct [phone] Cell [phone] Office [phone] Fax [name]

## 2015-10-01 ENCOUNTER — Other Ambulatory Visit: Payer: Self-pay

## 2015-10-01 NOTE — Patient Outreach (Signed)
[facility]) Care Management  10/01/2015  [name] May 13, 1932 [id number]  Telephonic Care Coordination Services  Issue: HH: [facility]  RN visits 1 x/week.  Daughter thought that patient was going to also receive PT but no visits yet.   (This Probation officer reviewed 09/10/15 PT evaluation:  No PT follow-up recommended.)  HH  Provider:  [facility]. Please assess on home visit for possible home PT needs).  Outreach call to patient and daughter to provide update.   Contacts not reached.  RN CM left name and number for call back via voice message.    [name], BSN, RN, [place] Management Care Management Coordinator [phone] Direct [phone] Cell [phone] Office [phone] Fax [name]

## 2015-10-01 NOTE — Patient Outreach (Signed)
[facility]) Care Management  10/01/2015  [name] November 22, 1932 [id number]  Referral Date: 09/27/2015 Source: [facility] Failure Program Issue: Filled new prescriptions? No Know exactly which meds to take? No Patient is eligible for [facility] services.   Subjective:  Inbound call returned from patient.  Patient gave verbal consent to discuss all PHI with daughter, [name].  Patient requested that [name] complete the call.  [name] confirms no medication issues.  States they notified cone this morning they no longer wish to participate in the [place] HF program due to patient is [facility] and prefers daughter to manage all his care.   Providers: Primary MD:  Dr. Christinia Gully -  last appt:  09/03/15 / next appt: 10/08/15 Oncologist:  Dr. Zola Button  HH: [facility]  RN visits 1 x/week.  Daughter thought that patient was going to also receive PT but no visits yet.  States RN left HF nurse but also advised patient to drink 6 bottles of water a day.  States patient is on fluid restrictions and has not followed this advice.   Social: Patient is a 82 yo widower and lives in his home alone.  Daughter provides caregiver services with visits twice a day.  Daughter prepares medication box and leaves med's in a dosing cup each day.   Mobility:  Ambulates with cane and Rolator as needed.  Falls: (2)  Fall prior to last admission 08/2015 and 2nd fall while in-patient; slipped due to foot slipped on sheet against the floor while getting up; no injury / only soreness.   Pain: none  Depression: non noted by daughter but states patient does have some sad days.  Transportation:  Daughter Caregiver: Daughter  Emergency Contact: Daughter or Son Advance Directive: yes and DNR - on file at [facility] (per daughter) Consent:  Yes - Patient prefers daughter to manage all care and take all calls.  Daughter, [name] # [phone]. Resources: none DME: cane, Rolator, shower chair and bars, W/C,  scales, eyeglasses, (BP Cuff is broken).  Co-morbidities:  Chronic diastolic CHF, Chronic A.Fib, Complete heart block status post pacemaker,  Obesity, Anemia, Hypothyroidism, Lymphoma,   Admissions:  (2) -09/09/2015 - 09/13/2015 - HCAP -06/30/2015 -  07/03/2015 -  dehydration and bronchitis in the setting of pancytopenia secondary to mild dysplastic syndrome BP 119/75 09/24/2015 Weight 227 lb (111 kg) 09/13/2015 Height 71 in (180 cm) 09/23/2015 BMI 34.10 (Obese Class I) 09/10/2015 A1C 6.900 07/01/2015  Medications:  Patient taking less than 15 medications  Co-pay cost issues: none Prevnar (PCV13) 06/14/2013 Pneumovax (PPS 10/29/2012 Flu Vaccine 12/03/2014 tDAP Vaccine 06/10/2015  Objective:   Encounter Medications:  Outpatient Encounter Prescriptions as of 10/01/2015  Medication Sig Note  . acyclovir ointment (ZOVIRAX) 5 % Apply topically 4 (four) times daily.   [name] albuterol (PROVENTIL HFA;VENTOLIN HFA) 108 (90 Base) MCG/ACT inhaler Inhale 2 puffs into the lungs every 4 (four) hours as needed for wheezing or shortness of breath.   [name] albuterol (PROVENTIL) (2.5 MG/3ML) 0.083% nebulizer solution Take 3 mLs (2.5 mg total) by nebulization every 4 (four) hours as needed for wheezing or shortness of breath.   . ALPRAZolam (XANAX) 0.25 MG tablet Take 0.25 mg by mouth at bedtime as needed for anxiety or sleep.   . calcium-vitamin D (OS-CAL 500 + D) 500-200 MG-UNIT per tablet Take 1 tablet by mouth 2 (two) times daily.    . chlorpheniramine (CHLOR-TRIMETON) 4 MG tablet Take 4 mg by mouth every 4 (four) hours as needed (  drippy nose).   . ciprofloxacin (CIPRO) 500 MG tablet Take 1 tablet (500 mg total) by mouth 2 (two) times daily. 09/10/2015: COURSE COMPLETED 08/14  . dextromethorphan-guaiFENesin (MUCINEX DM) 30-600 MG 12hr tablet Take 1-2 tablets by mouth 2 (two) times daily as needed for cough.   . docusate sodium (COLACE) 100 MG capsule Take 100 mg by mouth daily as needed for mild constipation or moderate  constipation.    . famotidine (PEPCID) 20 MG tablet Take 20 mg by mouth at bedtime.    . finasteride (PROSCAR) 5 MG tablet TAKE 1 TABLET BY MOUTH EVERY MORNING (Patient taking differently: TAKE 5 MG BY MOUTH EVERY MORNING)   . fluconazole (DIFLUCAN) 100 MG tablet Take 1 tablet (100 mg total) by mouth daily.   . fluticasone (FLONASE) 50 MCG/ACT nasal spray 1-2 puffs each nostril once or twice daily for allergy (Patient taking differently: Place 2 sprays into both nostrils daily. )   . furosemide (LASIX) 40 MG tablet TAKE 1 TABLET BY MOUTH EVERY DAY MAY TAKE 1 EXTRA TABLET DAILY IF NEEDED FOR SWELLING IN LEGS (Patient taking differently: TAKE 20 MG BY MOUTH EVERY DAY MAY TAKE 20 MG EXTRA DAILY IF NEEDED FOR SWELLING IN LEGS)   . hydrOXYzine (ATARAX/VISTARIL) 25 MG tablet Take 1-2 tabs by mouth every 4-6 hours as needed for itching   . KLOR-CON M20 20 MEQ tablet TAKE 1 TABLET DAILY (Patient taking differently: TAKE 20 MEQ BY MOUTH DAILY)   . levofloxacin (LEVAQUIN) 750 MG tablet Take 1 tablet (750 mg total) by mouth daily.   [name] levothyroxine (SYNTHROID, LEVOTHROID) 25 MCG tablet TAKE 1 TABLET BY MOUTH EVERY MORNING BEFORE BREAKFAST (Patient taking differently: TAKE 25 MCG BY MOUTH EVERY MORNING BEFORE BREAKFAST)   . metFORMIN (GLUCOPHAGE) 500 MG tablet TAKE 1 TABLET BY MOUTH TWICE A DAY WITH FOOD (Patient taking differently: TAKE 250 MG BY MOUTH TWICE A DAY WITH FOOD)   . mometasone-formoterol (DULERA) 200-5 MCG/ACT AERO Inhale 2 puffs into the lungs every 12 (twelve) hours.   . Multiple Vitamin (MULTIVITAMIN) tablet Take 1 tablet by mouth every morning.    . nitroGLYCERIN (NITROSTAT) 0.4 MG SL tablet Place 1 tablet (0.4 mg total) under the tongue every 5 (five) minutes as needed for chest pain. 09/10/2015: Not used recently   . omeprazole (PRILOSEC) 20 MG capsule Take 40 mg by mouth daily before breakfast.    . oxybutynin (DITROPAN-XL) 10 MG 24 hr tablet Take 10 mg by mouth every morning. Reported on  05/13/2015   . OXYGEN 2 lpm with sleep   . polyethylene glycol (MIRALAX / GLYCOLAX) packet Take 17 g by mouth daily as needed for moderate constipation or severe constipation.    . traMADol (ULTRAM) 50 MG tablet Take 1-1/2 tablet every 4 hours as needed for severe cough and pain   . traZODone (DESYREL) 150 MG tablet Take 1 tablet (150 mg total) by mouth at bedtime.    No facility-administered encounter medications on file as of 10/01/2015.     Functional Status:  In your present state of health, do you have any difficulty performing the following activities: 10/01/2015 09/10/2015  Hearing? Y N  Vision? N N  Difficulty concentrating or making decisions? N N  Walking or climbing stairs? Y Y  Dressing or bathing? N N  Doing errands, shopping? Tempie Donning  Preparing Food and eating ? Y -  Using the Toilet? N -  In the past six months, have you accidently leaked urine? N -  Do you have problems with loss of bowel control? N -  Managing your Medications? N -  Managing your Finances? N -  Housekeeping or managing your Housekeeping? Y -  Some recent data might be hidden    Fall/Depression Screening: PHQ 2/9 Scores 10/01/2015 09/15/2013 12/19/2012 11/22/2012  PHQ - 2 Score 1 0 0 0    Fall Risk  10/01/2015 02/27/2015 12/08/2013 09/15/2013 12/19/2012  Falls in the past year? Yes No No No No  Number falls in past yr: 2 or more - - - -  Injury with Fall? No - - - -  Risk Factor Category  High Fall Risk - - - -  Risk for fall due to : History of fall(s);Impaired balance/gait;Impaired mobility;Other (Comment) - Impaired balance/gait;Impaired mobility Impaired balance/gait -  Risk for fall due to (comments): Anemia - - - -  Follow up Falls evaluation completed;Education provided;Falls prevention discussed - - - -     Preventives: Hearing: HOH - has bilateral hearing aids but does not always wear.  Eyes: last appt 2015 Dentist: none  Podiatrist: none Colonoscopy:  03/03/2005  Assessment / Plan:  [place] RN CM Referral   Caregiver and patient verbal consent obtained to speak with Daughter, [name] # [phone]. -Multiple co-morbidities -Hospital admission within the past 30 days.  -High risk for falls and history of 2 falls within the past 30 days.  -HH PT services have not been initiated per daughter.  (This Probation officer reviewed 09/10/15 PT evaluation:  No PT follow-up recommended.)  HH  Provider:  [facility]. Please assess on home visit for possible home PT needs).     -BP cuff in the home - broken  RN CM advised patient in next [facility] scheduled contact call within the next 10 business days. RN CM advised to please notify MD of any changes in condition prior to scheduled appt's.   RN CM provided contact name and # [phone] or main office # [phone] and 24-hour nurse line # [phone].  RN CM confirmed patient is aware of 911 services for urgent emergency needs.  [name], BSN, RN, [place] Management Care Management Coordinator [phone] Direct [phone] Cell [phone] Office [phone] Fax [email or web address]

## 2015-10-01 NOTE — Patient Outreach (Signed)
[place] [facility]) Care Management  10/01/2015  [name] Apr 13, 1932 [id number]  Telephonic Screening   Referral Date:  09/27/2015 Source:  [facility] Heart Failure Program Issue:  Filled new prescriptions? No Know exactly which meds to take? No Patient is eligible for [facility] services.   Outreach call #2 to patient. Contact identifies self as daughter and states patient is not available for call at this time.  Daughter attempted to complete call.   RN CM left HIPAA compliant message with name and number and advised would call back today and caller needs to speak with contact first.   RN CM scheduled for next outreach call today or within one week if not reached.   MR Review:  H/o Daughter, [name] RN CM will discuss HIPAA consent with patient on initial contact.   [name], BSN, RN, [place] Care Management Care Management Coordinator [phone] Direct [phone] Cell [phone] Office [phone] Fax [name]

## 2015-10-02 ENCOUNTER — Other Ambulatory Visit: Payer: Self-pay

## 2015-10-02 ENCOUNTER — Encounter: Payer: Self-pay | Admitting: *Deleted

## 2015-10-02 ENCOUNTER — Other Ambulatory Visit: Payer: Self-pay | Admitting: *Deleted

## 2015-10-02 NOTE — Patient Outreach (Signed)
[facility]) Care Management  10/02/2015  [name] [birth year] [id number]  Telephonic Care Coordination Services   Contact call to [facility] Provider:  [facility] [phone] Contact confirms order for [facility] PT services.   Contact states [facility] may be able to get services started this week but will have manager, Daleen Snook call RN CM back.  RN CM requested clarification on Referral source and date of referral.     Mariann Laster, MSHL, BSN, RN, [place] Management Care Management Coordinator [phone] Direct [phone] Cell [phone] Office [phone] Fax [email or web address]

## 2015-10-02 NOTE — Patient Outreach (Signed)
Initial outreach for community care management. I spoke to daughter, [name]. I informed her of my role with [place] Management and asked if I could schedule a home visit and she agreed to an appt on Friday at 10:00 am.  She reports the Home Health nurse has only come out one time last week and no PT has come yet. She has called the agency and my associate Victor Hutchinson, RN, has a lot reached out to find out their plan of care. Mrs. [name] has already completed much of the intial evaluation over the phone.  I have advised [name] that if the agency RN wants to come at the same time that I have scheduled my appt she can call and advise me and we can reschedule my appt.  [name] [facility] [phone]

## 2015-10-02 NOTE — Patient Outreach (Signed)
[facility]) Care Management  10/02/2015  [name] [birth year] [id number]  Telephonic Care Coordination Services [place]:  [name] PT update from [name] call to patient and caregiver, daughter, [name].  Contacts not reached.  RN CM left HIPAA compliant voice message with name and number.   [name], BSN, RN, [place] Management Care Management Coordinator [phone] Direct [phone] Cell [phone] Office [phone] Fax [name]

## 2015-10-03 ENCOUNTER — Ambulatory Visit: Payer: Self-pay | Admitting: *Deleted

## 2015-10-04 ENCOUNTER — Ambulatory Visit: Payer: Medicare Other | Admitting: *Deleted

## 2015-10-04 ENCOUNTER — Other Ambulatory Visit: Payer: Self-pay | Admitting: *Deleted

## 2015-10-04 NOTE — Patient Outreach (Signed)
Daughter called and reported that the home health nurse has come so I don't need to come. She did agree that I could call weekly during the next month for transition of care calls. I will call them again next week.  Deloria Lair [facility] [phone]

## 2015-10-05 ENCOUNTER — Other Ambulatory Visit: Payer: Self-pay | Admitting: Internal Medicine

## 2015-10-07 ENCOUNTER — Other Ambulatory Visit: Payer: Self-pay | Admitting: *Deleted

## 2015-10-07 NOTE — Patient Outreach (Signed)
Telephone assessment. Pt states he is doing well. The home health nurse has been out several times now. She is recording all his vital signs. Pt's daughter is very involved with pt and monitoring his status. He referred me to her to get any more detail today.  I called and talked with [name], pt's daughter who verifies pt is starting to do his daily required monitoring to manage his heart failure. She verified that he has an appt with Dr. [name] tomorrow. He will also see his oncologist this week.  I told her I would check in again next week to ensure he is stabilized and continuing his self management.  I asked if she had any concerns or any questions that I could answer and she said, "No."  Deloria Lair [facility] [phone]

## 2015-10-08 ENCOUNTER — Encounter: Payer: Self-pay | Admitting: Internal Medicine

## 2015-10-08 ENCOUNTER — Ambulatory Visit (INDEPENDENT_AMBULATORY_CARE_PROVIDER_SITE_OTHER): Payer: Medicare Other | Admitting: Internal Medicine

## 2015-10-08 ENCOUNTER — Ambulatory Visit (INDEPENDENT_AMBULATORY_CARE_PROVIDER_SITE_OTHER)
Admission: RE | Admit: 2015-10-08 | Discharge: 2015-10-08 | Disposition: A | Discharge status: Home / Self Care | Payer: Medicare Other | Source: Ambulatory Visit | Attending: Internal Medicine | Admitting: Internal Medicine

## 2015-10-08 VITALS — BP 110/66 | HR 108 | Temp 98.0°F | Ht 71.0 in | Wt 220.0 lb

## 2015-10-08 DIAGNOSIS — J9611 Chronic respiratory failure with hypoxia: Secondary | ICD-10-CM | POA: Diagnosis not present

## 2015-10-08 DIAGNOSIS — R0600 Dyspnea, unspecified: Secondary | ICD-10-CM

## 2015-10-08 DIAGNOSIS — J45991 Cough variant asthma: Secondary | ICD-10-CM

## 2015-10-08 DIAGNOSIS — J189 Pneumonia, unspecified organism: Secondary | ICD-10-CM | POA: Diagnosis not present

## 2015-10-08 NOTE — Patient Instructions (Addendum)
Please remember to go to the  x-ray department downstairs for your tests - we will call you with the results when they are available.  See calendar for specific medication instructions and bring it back for each and every office visit for every healthcare provider you see.  Without it,  you may not receive the best quality medical care that we feel you deserve.  You will note that the calendar groups together  your maintenance  medications that are timed at particular times of the day.  Think of this as your checklist for what your doctor has instructed you to do until your next evaluation to see what benefit  there is  to staying on a consistent group of medications intended to keep you well.  The other group at the bottom is entirely up to you to use as you see fit  for specific symptoms that may arise between visits that require you to treat them on an as needed basis.  Think of this as your action plan or "what if" list.   Separating the top medications from the bottom group is fundamental to providing you adequate care going forward.    Please schedule a follow up office visit in 6 weeks, call sooner if needed to see Tammy Np with all meds in hand

## 2015-10-08 NOTE — Assessment & Plan Note (Addendum)
Pt is continuing to use home 02 at hs He is benefiting form the use of home 02 He does not have a portable system at present but does not need one based on today's walking sats

## 2015-10-08 NOTE — Patient Outreach (Signed)
Spoke to pt who says he is doing fine. Home health is involved with his care and seeing him routinely. Pt refers me to his daughter [name] for details of his condition and care.  I spoke with [name] and she verified that her father is doing well. He is weighing daily, has not edema or SOB. He is going to his oncologist this week.  I reminded her that I would like to check in weekly while pt is involved with home health and that if they need to speak to a nurse they are welcome to contact me. I have scheduled a call to her for the next couple of Mondays.  Deloria Lair [facility] [phone]

## 2015-10-08 NOTE — Progress Notes (Signed)
Subjective:    Patient ID: [name], male    DOB: 10/19/1932  MRN: [id number]  History of Present Illness  82 year old white male, former smoker( Quit date 25), with mild obesity complicated by aodm, Lymphoma s/p chemo ([facility]) and tendency to peripheral edema that is felt to be dependent and related to obesity/ venous insuff.Here for Follow up hospital admission visit.   Admit date: 01/18/2015 Discharge date: 01/22/2015  Discharge Diagnoses:  Principal Problem:  Sepsis due to pneumonia [facility]) Active Problems:  Lobar pneumonia, unspecified organism ([place])  Lactic acidosis  Pancytopenia (HCC)  MDS (myelodysplastic syndrome) (HCC)  Neutropenia (HCC)  Thrombocytopenia (HCC)  Anemia of chronic disease  Pacemaker  Chronic diastolic CHF (congestive heart failure) ([place])     03/05/2015 Follow up : DCHF , Cough variant asthma , DM  Pt returns for a 3 week follow up .  Says overall he is doing better. Recovering from PNA.  Clinically is improving with improved energy.  Working with PT at home.  Now able to walk with cane.  Minimal cough and congestion . No fever or discolored mucus.  No hemoptysis .  No increased leg swelling or orthopnea  Needs updated med calendar list, reviewed with pt and daughter.  Would like a refill of xanax, uses it at night to help with sleep. On low dose  rec No changes  04/12/15 Dr Donalee Citrin Script sent for doxycycline antibiotic Ok to take a probiotic with the antibiotic Script sent changing Dulera to Symbicort maintenance inhaler Script sent changing nasonex to flonase nasal spray     05/13/2015 extended  f/u ov/[name] re: sob /cough ? Asthma related  Chief Complaint  Patient presents with  . Follow-up    Breathing is progressively worse. He states he gets winded with very minimal exertion. Cough has not improved since the last visit. He still has occ wheeze.   cough is sporadic more often sitting in chair / and w/in 10 min  at hs x months with minimal mucoid sputum produced and no am excess  Breathing worse p change to symbicort from dulera but turns out at baseline using the neb bid, not prn as per med calendar  Doe = MMRC3 = can't walk 100 yards even at a slow pace at a flat grade s stopping due to sob  (not reproduced on walk test this ov) Much better after tx of prbc's vs prior. Using saba multiple forms hfa and neb rec Walk slower if getting short of breath with activity  Change trazadone 150 one whole at bedtime Restart dulera 200 2 every 12 hours  For drainage / throat tickle try take CHLORPHENIRAMINE  4 mg - take one every 4 hours as needed   Work on inhaler technique:   Plan A = Automatic = dulera 200 Take 2 puffs first thing in am and then another 2 puffs about 12 hours later  Plan B = Backup Only use your albuterol (proair) as a rescue medication. - Ok to use the inhaler up to 2 puffs  every 4 hours if you must but call for appointment if use goes up over your usual need - Don't leave home without it !!  (think of it like the spare tire for your car)  Plan C = Crisis/ Contringency - only use your albuterol nebulizer if you first try Plan B  Depomedrol 120 today     06/10/2015  Extended  f/u ov/[name] re: doe/ restriction on pfts (p  am dulera 200) Chief Complaint  Patient presents with  . Follow-up    Breathing is unchanged. No new co's today.   thinks maybe a little better sob p depomedrol 120 last ov but biggest change was with tx prbcs - using saba in hfa form only (never neb) around noon and 6 pm though supposed to be using dulera 200 2 when wakes and 12 h later per med calendar, very easily confused with details of care / can't seem to remember whether he's bothered by cough or not - could not find chlorpheniramine  >>depo medrol shot   07/08/2015 NP Follow up : Med calendar and Cough variant Asthma / MDS /D-CHF   admitted 06/30/15 for acute bronchitis, tx w/ abx. He has MDS with pancytopenia  requiring frequent blood  transfusions to keep Hbg >9.  He received 2 u PRBC during admission ,  Hbg at discharge last week was 8.8.  Pt brought all his meds in today , we reviewed them and organized them into a med calendar with pt education.  His daughter helps him with his meds. Appears to be taking correctly.  He remains sob and weak since discharge. They are worried hbg is getting worse.  He has follow up with Hematology next week.  No known bleeding . He does take sulindac for joint pain, advised to stop this .  Noticing that chronic leg swelling is worse lately. Advised to use extra lasix as previously instructed.  Has dry cough and DOE -chronic but worse for last month.  rec Stop Sulindac .  Labs today .  Follow with Dr. [name] as planned next week.  Follow med calendar closely and bring to each visit.    09/03/2015 acute extended ov/[name] re: low grade fever/ multiple complaints new urinary incont on urology meds but no recent f/u  - 02 hs / dulear 200 2bid  Chief Complaint  Patient presents with  . Acute Visit    pt c/o non prod cough, right ear discomfort, low grade temp of 99.7, weakness, fatigue, wheezing, increase sob & dizziness X2wk  transfusion one week prior to [employer] [facility] / sob/ saba did not   Overall worse x two weeks indolent onset/ min progressive sob not at rest  Not clear using action plan appropriately Noted urinary incontinence over last x sev weeks prior to OV  But no dysuria/ back pain  rec Please remember to go to the lab and x-ray department downstairs for your tests - we will call you with the results when they are available. Cipro 500mg  twice daily x 7 days and be sure you have follow up with your urologist  Keep your scheduled appt with me in September - call sooner if needed Add:  Encouraged to use one extra  furosemide until feet swelling resolved  As per med cal instructions   Admit date: 09/09/2015 Discharge date: 09/13/2015   Discharge  Diagnoses:  Principal Problem:   HCAP (healthcare-associated pneumonia) Active Problems:   Symptomatic anemia   Pacemaker   Chronic diastolic CHF (congestive heart failure) ([place])  Discharge Condition: Stable  Diet recommendation: Heart healthy diet discussed in details   Brief Narrative  82 year old male with history of pancytopenia secondary to early myelodysplasia, history of lymphoma on admission, complete heart block status post pacemaker, A. fib not an adequate ideation recently treated with ciprofloxacin for UTI was brought to the ED as he was feeling very weak for past 2 weeks and had a near-syncopal episode.  In the ED his hemoglobin was 6. Chest x-ray showed edema versus infiltrate. CT chest done showed multifocal pneumonia.  Subjective:   Patient reports nonproductive cough and weakness. Overall better.   Assessment &Plan :   Principal Problem: HCAP (healthcare-associated pneumonia) - pt started onEmpiric Vanco and cefepime. Follow cultures. Supportive care with Tylenol and antitussives. - plan to transition to oral Levaquin to complete therapy for PNA   Active Problems: Generalized weakness with Symptomatic pancytopenia  - Secondary to myelodysplasia. Receives transfusion as outpatient.  - has already received units PRBC transfusion since admission with IV Lasix in between transfusion.  - discussed with Dr. [name], reviewed blood counts, he recommended no Plt transfusion unless pt actively bleeding and no Neupogen unless pt septic - Hg stable in the past 24 hours - family wants to take pt home, SNF recommended but pt and family declined offer - pt did have an episode of fall in the hospital and concern was for discharge home, pt and family made aware   Chronic diastolic CHF (congestive heart failure) ([place]) - on IV Lasix in between transfusion and placed on home dose daily Lasix  Hypothyroidism  - continue Synthroid  Chronic A. fib - Not a  candidate for [facility] due to high risk bleed   Diabetes mellitus type 2 - resume home medical regimen   Lymphoma - outpatient follow up with Dr. [name] - PCT consulted as well for further goals of care discussions per pt's request   Complete heart block status post pacemaker - d/c tele   Obesity  - Body mass index is 34.02 kg/m.   10/08/2015  Post hosp f/u ov/[name] re:  AB/ obesity/ chf  ? Qualified for 02  Chief Complaint  Patient presents with  . Follow-up    Breathing is overall doing well, but he relates this to not moving around much. He has had some cough with minimal clear sputum.  more limited by weakness than sob at this point     No obvious day to day or daytime variability or assoc excess/ purulent sputum or mucus plugs or hemoptysis or cp or chest tightness, subjective wheeze or overt sinus or hb symptoms. No unusual exp hx or h/o childhood pna/ asthma or knowledge of premature birth.  Sleeping ok without nocturnal  or early am exacerbation  of respiratory  c/o's or need for noct saba. Also denies any obvious fluctuation of symptoms with weather or environmental changes or other aggravating or alleviating factors except as outlined above   Current Medications, Allergies, Complete Past Medical History, Past Surgical History, Family History, and Social History were reviewed in [employer] record.  ROS  The following are not active complaints unless bolded sore throat, dysphagia, dental problems, itching, sneezing,  nasal congestion or excess/ purulent secretions, ear ache,   fever, chills, sweats, unintended wt loss, classically pleuritic or exertional cp,  orthopnea pnd or leg swelling, presyncope, palpitations, abdominal pain, anorexia, nausea, vomiting, diarrhea  or change in bowel or bladder habits, change in stools or urine, dysuria,hematuria,  rash, arthralgias, visual complaints, headache, numbness, weakness or ataxia or problems with walking or  coordination,  change in mood/affect or memory.                      Past Medical History:  LYMPHOMA NEC, MLIG, INGUINAL/LOWER LIMB (ICD-202.85) dx [facility]....[facility]  - last chemo 10/2006 -repeat CT/ABD CT 11/18/07--no reccurence  - Pancytopenia August 06, 2009 > refer back to [facility] >  aranesp rx Jan 2012  RHINITIS, ALLERGIC NOS (ICD-477.9)  HYPERLIPIDEMIA NEC/NOS (ICD-272.4)  EXOGENOUS OBESITY (ICD-278.00)  - ideal body weight less than 186  AODM onset 2012 with freq pred for airways issues -HgA1C 5.5 12/12 >metformin decreased 500mg  1/2 Twice daily  Vitamin D Deficiency- level 29>>44 (4/9//10)  Left Hip pain onset around 6/09............................................................................ [facility]  - MRI 11/23/2007 c/w L1-2 bulging disc indents the thecal sac with foraminal stenosis  HEALTH MAINTENANCE..........................................................................................[facility]land KitchenWert  - Td 10/2005 and 06/10/2015  - Pneumovax 10/07 and 10/2012 and Prevnar 06/14/13   -colon 02/2005 -int/extern. hems (repeat q7y)  Complex med regimen  --Meds reviewed with pt education and computerized med calendar completed/adjusted. November 08, 2008 ,, 07/08/2015  August 21, 2009 updated 02/19/2011 , 10/23/2011 , 09/19/2013  Syncope...........................................................................................................[facility] KitchenHochrein  - Mobitz II AV block s/p Medtronic pacemaker placement 12/11/2008  [facility]at[facility].....................................................................................................[facility]  - Pruritic rash 04/2009 > tried lotrisone, referred to [facility] August 06, 2009  Memory impairment , MMSE 27/30 8/25>refer to neuro   Family History:  mother had diabetes   Social History:  Patient states former smoker.  quit smoking in 1970  No ETOH  Retired       Objective:   Physical Exam    obese wm  nad wc/ bound - very chronically   ill appearing   10/08/2015       220  09/03/15 247 lb (112 kg)  08/13/15 248 lb 12.8 oz (112.9 kg)  07/17/15 242 lb 3.2 oz (109.9 kg)    Vital signs reviewed  - sats 93% RA at rest in W/c   Physical Exam:    ENT: No sinus tenderness, TM clear, pale nasal mucosa, no oral exudate,no post nasal drip, no LAN/ ear canals  nl bilaterally  Cardiac: S1, S2, regular rate and rhythm, no murmur Chest: No wheeze/   no accessory muscle use, diminished bs in bases -faint BB crackles  Abd.: Soft Non-tender Ext: trace bilateral pitting edema both ankles Neuro:  Alert/ nl sensorium, no motor def Skin: No rashes, warm and dry Psych:appears to have  somewhat hopeless/helpless  Affect and easily confused with details of care         CXR PA and Lateral:   10/08/2015 :    I personally reviewed images and agree with radiology impression as follows:   Cardiomegaly. Persistent streaky atelectasis or infiltrate left base retrocardiac.

## 2015-10-09 ENCOUNTER — Ambulatory Visit (HOSPITAL_BASED_OUTPATIENT_CLINIC_OR_DEPARTMENT_OTHER): Payer: Medicare Other

## 2015-10-09 ENCOUNTER — Ambulatory Visit: Payer: Medicare Other

## 2015-10-09 ENCOUNTER — Ambulatory Visit (HOSPITAL_BASED_OUTPATIENT_CLINIC_OR_DEPARTMENT_OTHER): Payer: Medicare Other | Admitting: Oncology

## 2015-10-09 ENCOUNTER — Telehealth: Payer: Self-pay | Admitting: Oncology

## 2015-10-09 ENCOUNTER — Other Ambulatory Visit (HOSPITAL_BASED_OUTPATIENT_CLINIC_OR_DEPARTMENT_OTHER): Payer: Medicare Other

## 2015-10-09 VITALS — BP 105/68 | HR 94 | Temp 98.1°F | Resp 19 | Wt 226.4 lb

## 2015-10-09 DIAGNOSIS — D472 Monoclonal gammopathy: Secondary | ICD-10-CM

## 2015-10-09 DIAGNOSIS — Z8572 Personal history of non-Hodgkin lymphomas: Secondary | ICD-10-CM

## 2015-10-09 DIAGNOSIS — Z95828 Presence of other vascular implants and grafts: Secondary | ICD-10-CM

## 2015-10-09 DIAGNOSIS — D61818 Other pancytopenia: Secondary | ICD-10-CM

## 2015-10-09 DIAGNOSIS — D469 Myelodysplastic syndrome, unspecified: Secondary | ICD-10-CM

## 2015-10-09 LAB — CBC WITH DIFFERENTIAL/PLATELET
BASO%: 0 % (ref 0.0–2.0)
Basophils Absolute: 0 10*3/uL (ref 0.0–0.1)
EOS ABS: 0 10*3/uL (ref 0.0–0.5)
EOS%: 1 % (ref 0.0–7.0)
HEMATOCRIT: 25.2 % — AB (ref 38.4–49.9)
HGB: 8.5 g/dL — ABNORMAL LOW (ref 13.0–17.1)
LYMPH%: 54.8 % — AB (ref 14.0–49.0)
MCH: 31.7 pg (ref 27.2–33.4)
MCHC: 33.7 g/dL (ref 32.0–36.0)
MCV: 94 fL (ref 79.3–98.0)
MONO#: 0 10*3/uL — AB (ref 0.1–0.9)
MONO%: 1 % (ref 0.0–14.0)
NEUT%: 43.2 % (ref 39.0–75.0)
NEUTROS ABS: 0.5 10*3/uL — AB (ref 1.5–6.5)
PLATELETS: 47 10*3/uL — AB (ref 140–400)
RBC: 2.68 10*6/uL — AB (ref 4.20–5.82)
RDW: 20 % — ABNORMAL HIGH (ref 11.0–14.6)
WBC: 1 10*3/uL — AB (ref 4.0–10.3)
lymph#: 0.6 10*3/uL — ABNORMAL LOW (ref 0.9–3.3)
nRBC: 0 % (ref 0–0)

## 2015-10-09 LAB — PREPARE RBC (CROSSMATCH)

## 2015-10-09 MED ORDER — FUROSEMIDE 10 MG/ML IJ SOLN
INTRAMUSCULAR | Status: AC
Start: 2015-10-09 — End: 2015-10-09
  Filled 2015-10-09: qty 2

## 2015-10-09 MED ORDER — ACETAMINOPHEN 325 MG PO TABS
650.0000 mg | ORAL_TABLET | Freq: Once | ORAL | Status: AC
  Administered 2015-10-09: 650 mg via ORAL

## 2015-10-09 MED ORDER — SODIUM CHLORIDE 0.9% FLUSH
10.0000 mL | INTRAVENOUS | Status: AC | PRN
  Administered 2015-10-09: 10 mL
  Filled 2015-10-09: qty 10

## 2015-10-09 MED ORDER — SODIUM CHLORIDE 0.9 % IV SOLN
250.0000 mL | Freq: Once | INTRAVENOUS | Status: AC
  Administered 2015-10-09: 250 mL via INTRAVENOUS

## 2015-10-09 MED ORDER — DIPHENHYDRAMINE HCL 25 MG PO CAPS
25.0000 mg | ORAL_CAPSULE | Freq: Once | ORAL | Status: AC
  Administered 2015-10-09: 25 mg via ORAL

## 2015-10-09 MED ORDER — ACETAMINOPHEN 325 MG PO TABS
ORAL_TABLET | ORAL | Status: AC
  Filled 2015-10-09: qty 2

## 2015-10-09 MED ORDER — DIPHENHYDRAMINE HCL 25 MG PO CAPS
ORAL_CAPSULE | ORAL | Status: AC
  Filled 2015-10-09: qty 1

## 2015-10-09 MED ORDER — SODIUM CHLORIDE 0.9 % IJ SOLN
10.0000 mL | INTRAMUSCULAR | Status: DC | PRN
  Administered 2015-10-09: 10 mL via INTRAVENOUS
  Filled 2015-10-09: qty 10

## 2015-10-09 MED ORDER — FUROSEMIDE 10 MG/ML IJ SOLN
20.0000 mg | Freq: Once | INTRAMUSCULAR | Status: AC
  Administered 2015-10-09: 20 mg via INTRAVENOUS

## 2015-10-09 MED ORDER — HEPARIN SOD (PORK) LOCK FLUSH 100 UNIT/ML IV SOLN
500.0000 [IU] | Freq: Every day | INTRAVENOUS | Status: AC | PRN
  Administered 2015-10-09: 500 [IU]
  Filled 2015-10-09: qty 5

## 2015-10-09 NOTE — Patient Instructions (Signed)
Blood Transfusion, Care After  These instructions give you information about caring for yourself after your procedure. Your doctor may also give you more specific instructions. Call your doctor if you have any problems or questions after your procedure.   HOME CARE   Take medicines only as told by your doctor. Ask your doctor if you can take an over-the-counter pain reliever if you have a fever or headache a day or two after your procedure.   Return to your normal activities as told by your doctor.  GET HELP IF:    You develop redness or irritation at your IV site.   You have a fever, chills, or a headache that does not go away.   Your pee (urine) is darker than normal.   Your urine turns:    Pink.    Red.    Brown.   The white part of your eye turns yellow (jaundice).   You feel weak after doing your normal activities.  GET HELP RIGHT AWAY IF:    You have trouble breathing.   You have fever and chills and you also have:    Anxiety.    Chest or back pain.    Flushed or pink skin.    Clammy or sweaty skin.    A fast heartbeat.    A sick feeling in your stomach (nausea).     This information is not intended to replace advice given to you by your health care provider. Make sure you discuss any questions you have with your health care provider.     Document Released: 02/02/2014 Document Reviewed: 02/02/2014  [employer]vier Interactive Patient Education 2016 [employer]

## 2015-10-09 NOTE — Progress Notes (Signed)
Hematology and Oncology Follow Up Visit  [name] [id number] 04-Sep-1932 82 y.o. 10/09/2015 9:48 AM  CC: [name]. Victor Novas, MD, FCCP    Principle Diagnosis: 82 year old gentleman with the following diagnoses:   1. Follicular lymphoma, grade 3, stage III, diagnosed 2007. Continued to be in remission. 2. Pancytopenia associated with mild neutropenia and macrocytosis possibly indicating early myelodysplasia. Bone marrow  was repeated on 06/22/2012 confirmed the evidence of myelodysplastic syndrome 3. Monoclonal gammopathy of undetermined significance.  Prior Therapy:  1. Status post CHOP and rituximab.  He had complete response to therapy concluded in 2007.  No further imaging has been indicated at this time. 2. Status post bone marrow biopsy in April 2011, did not really show any evidence of myelodysplasia or metastatic lymphoma at this time.  Current therapy: PRBC transfusions to keep Hgb above 9.  Interim History: [name] presents today for a followup visit with his daughter. Since his last visit, he was hospitalized for pneumonia in August 2017. Since his discharge, he is recovering slowly at this time. He is back at home although he is requiring more and more assistance from his family.    He denied any falls or syncope. He does report exertional dyspnea and occasional cough which is unchanged. He denied any hemoptysis or wheezing. He does not report any bleeding episodes such as epistaxis, hematochezia or melena. His appetite is reasonable and started to gain weight after a period of weight loss. His performance status is declining slowly.   He did not report any headaches, blurry vision syncope or seizures. He does not report any palpitation or orthopnea. Has not reported any edema. Does not report any wheezing but still has exertional dyspnea.  He does not report any lymphadenopathy or petechiae. Has not reported any skin rashes or genitourinary complaints. He denied any nausea,  vomiting, abdominal pain. He denied any early satiety or distention. Remainder of review of systems unremarkable.   Medications: I have reviewed the patient's current medications Current Outpatient Prescriptions  Medication Sig Dispense Refill  . acyclovir ointment (ZOVIRAX) 5 % Apply topically 4 (four) times daily. 15 g 0  . albuterol (PROVENTIL HFA;VENTOLIN HFA) 108 (90 Base) MCG/ACT inhaler Inhale 2 puffs into the lungs every 4 (four) hours as needed for wheezing or shortness of breath.    [name] albuterol (PROVENTIL) (2.5 MG/3ML) 0.083% nebulizer solution Take 3 mLs (2.5 mg total) by nebulization every 4 (four) hours as needed for wheezing or shortness of breath. 75 mL 12  . ALPRAZolam (XANAX) 0.25 MG tablet Take 0.25 mg by mouth at bedtime as needed for anxiety or sleep.    . calcium-vitamin D (OS-CAL 500 + D) 500-200 MG-UNIT per tablet Take 1 tablet by mouth 2 (two) times daily.     . chlorpheniramine (CHLOR-TRIMETON) 4 MG tablet Take 4 mg by mouth every 4 (four) hours as needed (drippy nose).    [name] dextromethorphan-guaiFENesin (MUCINEX DM) 30-600 MG 12hr tablet Take 1-2 tablets by mouth 2 (two) times daily as needed for cough.    . docusate sodium (COLACE) 100 MG capsule Take 100 mg by mouth daily as needed for mild constipation or moderate constipation.     . famotidine (PEPCID) 20 MG tablet Take 20 mg by mouth at bedtime.     . finasteride (PROSCAR) 5 MG tablet TAKE 1 TABLET BY MOUTH EVERY MORNING (Patient taking differently: TAKE 5 MG BY MOUTH EVERY MORNING) 30 tablet 5  . fluticasone (FLONASE) 50 MCG/ACT nasal spray 1-2 puffs  each nostril once or twice daily for allergy (Patient taking differently: Place 2 sprays into both nostrils daily. ) 16 g 12  . furosemide (LASIX) 40 MG tablet TAKE 1 TABLET BY MOUTH EVERY DAY MAY TAKE 1 EXTRA TABLET DAILY IF NEEDED FOR SWELLING IN LEGS 30 tablet 5  . hydrOXYzine (ATARAX/VISTARIL) 25 MG tablet Take 1-2 tabs by mouth every 4-6 hours as needed for itching     . KLOR-CON M20 20 MEQ tablet TAKE 1 TABLET DAILY (Patient taking differently: TAKE 20 MEQ BY MOUTH DAILY) 30 tablet 5  . levothyroxine (SYNTHROID, LEVOTHROID) 25 MCG tablet TAKE 1 TABLET BY MOUTH EVERY MORNING BEFORE BREAKFAST (Patient taking differently: TAKE 25 MCG BY MOUTH EVERY MORNING BEFORE BREAKFAST) 30 tablet 11  . metFORMIN (GLUCOPHAGE) 500 MG tablet TAKE 1 TABLET BY MOUTH TWICE A DAY WITH FOOD (Patient taking differently: TAKE 250 MG BY MOUTH TWICE A DAY WITH FOOD) 60 tablet 5  . mometasone-formoterol (DULERA) 200-5 MCG/ACT AERO Inhale 2 puffs into the lungs every 12 (twelve) hours.    . Multiple Vitamin (MULTIVITAMIN) tablet Take 1 tablet by mouth every morning.     [name] omeprazole (PRILOSEC) 20 MG capsule Take 40 mg by mouth daily before breakfast.     . oxybutynin (DITROPAN-XL) 10 MG 24 hr tablet Take 10 mg by mouth every morning. Reported on 05/13/2015    . OXYGEN 2 lpm with sleep    . polyethylene glycol (MIRALAX / GLYCOLAX) packet Take 17 g by mouth daily as needed for moderate constipation or severe constipation.     . traMADol (ULTRAM) 50 MG tablet Take 1-1/2 tablet every 4 hours as needed for severe cough and pain    . traZODone (DESYREL) 150 MG tablet Take 1 tablet (150 mg total) by mouth at bedtime. 30 tablet 0  . nitroGLYCERIN (NITROSTAT) 0.4 MG SL tablet Place 1 tablet (0.4 mg total) under the tongue every 5 (five) minutes as needed for chest pain. (Patient not taking: Reported on 10/09/2015) 90 tablet 3   No current facility-administered medications for this visit.      Allergies: No Known Allergies  Past Medical History, Surgical history, Social history, and Family History were reviewed and updated.    Physical Exam: Blood pressure 105/68, pulse 94, temperature 98.1 F (36.7 C), temperature source Oral, resp. rate 19, weight 226 lb 6.4 oz (102.7 kg), SpO2 94 %. ECOG: 1 General appearance: Chronically ill appearing gentleman without distress. Head: Normocephalic,  without obvious abnormality. No oral thrush. Neck: no adenopathy. No palpable masses. Lymph nodes: Cervical, supraclavicular, and axillary nodes normal. Heart:regular rate and rhythm, S1, S2 normal, no murmur, click, rub or gallop Lung:chest clear, no wheezing, rales, normal symmetric air entry. Abdomen: soft, non-tender, without masses or organomegaly. No rebound or guarding. EXT:no erythema, induration, or nodules. No edema. Skin: No rashes or lesions noted.    Lab Results: Lab Results  Component Value Date   WBC 1.0 (L) 10/09/2015   HGB 8.5 (L) 10/09/2015   HCT 25.2 (L) 10/09/2015   MCV 94.0 10/09/2015   PLT 47 (L) 10/09/2015     Chemistry      Component Value Date/Time   NA 136 09/23/2015 1103   K 4.0 09/23/2015 1103   CL 97 (L) 09/13/2015 0415   CL 99 06/08/2012 1257   CO2 26 09/23/2015 1103   BUN 14.6 09/23/2015 1103   CREATININE 0.8 09/23/2015 1103      Component Value Date/Time   CALCIUM 9.2 09/23/2015 1103  ALKPHOS 93 09/23/2015 1103   AST 35 (H) 09/23/2015 1103   ALT 45 09/23/2015 1103   BILITOT 0.66 09/23/2015 1103      Impression and Plan:  This is a 82 year old gentleman with the following issues:  1. Follicular lymphoma diagnosed in 2007. He continues to be in remission at this time. 2. Myelodysplastic syndrome. He is status post bone marrow biopsy was done on 06/22/2012. He is currently on supportive transfusion only due to the lack of response from Aranesp. He declined 5-azacytidine chemotherapy given the potential complications and his transfusion needs. The plan is to continue supportive transfusion and care. He is currently transfusion dependent and will continue to do so rest of his life. 3. Neutropenia: Neutropenic precautions were given to the patient today. He had a recent hospitalization for pneumonia which resolved at this time. 4. Monoclonal gammopathy.  His M-spike had been less than 1 g/dL, his bone marrow biopsy continued to show a less than  8% plasma cells indicating most likely multiple myeloma.  Last SPEP was done in September 2016 and this will be repeated annually. 5. Thrombocytopenia: Related to his marrow failure and no bleeding noted at this time. 6. IV access: Port-A-Cath is in place without complications or bleeding. 7. Followup every 2 weeks for an injection and possible transfusion. He will have a clinical visit in 2 months.  [facility], MD 9/13/20179:48 AM

## 2015-10-09 NOTE — Telephone Encounter (Signed)
APPOINTMENTS COMPLETE PER 9/13 LOS. ADDED FLUSH APPOINTMENTS PER DTR. DTR WILL GET NEW PRINTOUT ON INF.   PER DTR SPOKE DESK NURSE TO CHECK AND CONFIRM IF 11/15 F/U IS REALLY WHAT PROVIDER INTENDED BEING THAT IS DOES NOT FALL IN LINE WITH THE Q2W LAB/BLOOD APPOINTMENTS. DESK NURSE WILL GET BACK TO ME - DTR AWARE.

## 2015-10-09 NOTE — Progress Notes (Signed)
Patient's daughter wanted to give patient pills from home today prior to blood adminstration. She had 2 red oblong tablets on his table that only had an imprint code on one side and it read 350. She stated they were "[employer] lion Tylenol extra strength".  I told her I was concerned because 350 did not represent a Tylenol strength and I could not identify the imprint on drugs. Com.  She seemed upset so I talked with Lupe Carney and pharmacy and they could not identify either and then I also informed patient that bringing in home medications is not our policy and it is not the safest route for the patient. Pt agreed.

## 2015-10-09 NOTE — Telephone Encounter (Signed)
Per desk nurse f/u should be 11/8. Schedule corrected with f/u being added to 11/18 lab/infusion. Lab/infusion for 11/15 - cxd - lab/inf appointments are q2w.   Dtr aware and will get new print out in inf area.

## 2015-10-09 NOTE — Progress Notes (Signed)
Spoke with pt and notified of results per Dr. Wert. Pt verbalized understanding and denied any questions. 

## 2015-10-09 NOTE — Progress Notes (Signed)
Lasix to be given in between unit one and unit 2 - order clarified with MD Shadad.

## 2015-10-10 LAB — TYPE AND SCREEN
ABO/RH(D): A POS
ANTIBODY SCREEN: NEGATIVE
Unit division: 0
Unit division: 0

## 2015-10-13 NOTE — Assessment & Plan Note (Signed)
05/13/2015   Walked RA  2 laps @ 185 ft each stopped due to  Sob/ sore legs relatively fast pace, sats 92% at lowest  - PFT's 06/07/15 c/w low ERV only  - 10/08/2015   Walked RA x 50 ft stopped due to  Weakness, sats still 94%  No evidence of 02 need at this point at rest or with activity - problem is deconditioning/ morbid obesity plus ? diastolic dysfunction

## 2015-10-13 NOTE — Assessment & Plan Note (Signed)
05/13/2015  After extensive coaching HFA effectiveness =    75% > rechallenge with dulera 200 2bid s spacer - PFT's  06/07/15 >   Restrictive with ERV 31% p dulera w/in 4 h and no obstruction - 06/10/2015  After extensive coaching HFA effectiveness =    75%   All goals of chronic asthma control met including optimal function and elimination of symptoms with minimal need for rescue therapy.  Contingencies discussed in full including contacting this office immediately if not controlling the symptoms using the rule of two's.

## 2015-10-13 NOTE — Assessment & Plan Note (Signed)
He is at very high risk of recurrent pna given pancytopenia but at this point don't believe chasing infiltrates on cxr with more and more abx is in his interest and need to wait for more convincing signs of infection as creating environment for more resistant orgs/ pmc here  Discussed in detail all the  indications, usual  risks and alternatives  relative to the benefits with patient who agrees to proceed with conservative f/u as outlined   I had an extended discussion with the patient reviewing all relevant studies completed to date and  lasting 15 to 20 minutes of a 25 minute visit    Each maintenance medication was reviewed in detail including most importantly the difference between maintenance and prns and under what circumstances the prns are to be triggered using an action plan format that is not reflected in the computer generated alphabetically organized AVS but trather by a customized med calendar that reflects the AVS meds with confirmed 100% correlation.   Please see instructions for details which were reviewed in writing and the patient given a copy highlighting the part that I personally wrote and discussed at today's ov.

## 2015-10-14 ENCOUNTER — Telehealth: Payer: Self-pay | Admitting: Cardiology

## 2015-10-14 ENCOUNTER — Other Ambulatory Visit: Payer: Self-pay | Admitting: *Deleted

## 2015-10-14 ENCOUNTER — Encounter: Payer: Medicare Other | Admitting: *Deleted

## 2015-10-14 NOTE — Telephone Encounter (Signed)
LMOVM reminding pt to send remote transmission.   

## 2015-10-14 NOTE — Patient Outreach (Signed)
Telephone assessment: Tilda Franco Cox, pt's daughter, as directed per pt. I was not able to speak with her but I left a message and requsted a return call.  Deloria Lair [facility] [place] [phone]

## 2015-10-16 ENCOUNTER — Ambulatory Visit (INDEPENDENT_AMBULATORY_CARE_PROVIDER_SITE_OTHER): Payer: Medicare Other | Admitting: *Deleted

## 2015-10-16 DIAGNOSIS — I441 Atrioventricular block, second degree: Secondary | ICD-10-CM | POA: Diagnosis not present

## 2015-10-21 ENCOUNTER — Other Ambulatory Visit: Payer: Self-pay | Admitting: *Deleted

## 2015-10-22 ENCOUNTER — Encounter: Payer: Self-pay | Admitting: Cardiology

## 2015-10-22 NOTE — Patient Outreach (Signed)
Transition of care call. Intended case closure. Unable to reach pt's daughter, [name]. I left a message and requested a return call.  Deloria Lair [facility] [place] [phone]

## 2015-10-22 NOTE — Progress Notes (Signed)
Remote pacemaker transmission.   

## 2015-10-23 ENCOUNTER — Other Ambulatory Visit (HOSPITAL_BASED_OUTPATIENT_CLINIC_OR_DEPARTMENT_OTHER): Payer: Medicare Other

## 2015-10-23 DIAGNOSIS — D61818 Other pancytopenia: Secondary | ICD-10-CM

## 2015-10-23 DIAGNOSIS — D469 Myelodysplastic syndrome, unspecified: Secondary | ICD-10-CM

## 2015-10-23 LAB — CBC WITH DIFFERENTIAL/PLATELET
BASO%: 0.4 % (ref 0.0–2.0)
BASOS ABS: 0 10*3/uL (ref 0.0–0.1)
EOS ABS: 0 10*3/uL (ref 0.0–0.5)
EOS%: 1.2 % (ref 0.0–7.0)
HCT: 29.3 % — ABNORMAL LOW (ref 38.4–49.9)
HGB: 9.8 g/dL — ABNORMAL LOW (ref 13.0–17.1)
LYMPH#: 0.7 10*3/uL — AB (ref 0.9–3.3)
LYMPH%: 59.9 % — AB (ref 14.0–49.0)
MCH: 32 pg (ref 27.2–33.4)
MCHC: 33.4 g/dL (ref 32.0–36.0)
MCV: 95.8 fL (ref 79.3–98.0)
MONO#: 0 10*3/uL — AB (ref 0.1–0.9)
MONO%: 0.5 % (ref 0.0–14.0)
NEUT%: 38 % — ABNORMAL LOW (ref 39.0–75.0)
NEUTROS ABS: 0.4 10*3/uL — AB (ref 1.5–6.5)
Platelets: 60 10*3/uL — ABNORMAL LOW (ref 140–400)
RBC: 3.06 10*6/uL — AB (ref 4.20–5.82)
RDW: 20.7 % — AB (ref 11.0–14.6)
WBC: 1.1 10*3/uL — ABNORMAL LOW (ref 4.0–10.3)

## 2015-10-27 ENCOUNTER — Inpatient Hospital Stay (HOSPITAL_COMMUNITY)
Admission: EM | Admit: 2015-10-27 | Discharge: 2015-11-02 | DRG: 871 | Disposition: A | Discharge status: Skilled Nursing Facility | Payer: Medicare Other | Attending: Internal Medicine | Admitting: Internal Medicine

## 2015-10-27 ENCOUNTER — Other Ambulatory Visit: Payer: Self-pay

## 2015-10-27 ENCOUNTER — Emergency Department (HOSPITAL_COMMUNITY): Payer: Medicare Other

## 2015-10-27 ENCOUNTER — Encounter (HOSPITAL_COMMUNITY): Payer: Self-pay

## 2015-10-27 DIAGNOSIS — D61818 Other pancytopenia: Secondary | ICD-10-CM | POA: Diagnosis present

## 2015-10-27 DIAGNOSIS — J9621 Acute and chronic respiratory failure with hypoxia: Secondary | ICD-10-CM | POA: Diagnosis present

## 2015-10-27 DIAGNOSIS — I50810 Right heart failure, unspecified: Secondary | ICD-10-CM | POA: Diagnosis present

## 2015-10-27 DIAGNOSIS — R0602 Shortness of breath: Secondary | ICD-10-CM

## 2015-10-27 DIAGNOSIS — D469 Myelodysplastic syndrome, unspecified: Secondary | ICD-10-CM | POA: Diagnosis not present

## 2015-10-27 DIAGNOSIS — R531 Weakness: Secondary | ICD-10-CM | POA: Diagnosis not present

## 2015-10-27 DIAGNOSIS — Z66 Do not resuscitate: Secondary | ICD-10-CM | POA: Diagnosis present

## 2015-10-27 DIAGNOSIS — I480 Paroxysmal atrial fibrillation: Secondary | ICD-10-CM | POA: Diagnosis present

## 2015-10-27 DIAGNOSIS — Z87891 Personal history of nicotine dependence: Secondary | ICD-10-CM

## 2015-10-27 DIAGNOSIS — R0600 Dyspnea, unspecified: Secondary | ICD-10-CM

## 2015-10-27 DIAGNOSIS — R262 Difficulty in walking, not elsewhere classified: Secondary | ICD-10-CM

## 2015-10-27 DIAGNOSIS — E11649 Type 2 diabetes mellitus with hypoglycemia without coma: Secondary | ICD-10-CM | POA: Diagnosis present

## 2015-10-27 DIAGNOSIS — D649 Anemia, unspecified: Secondary | ICD-10-CM

## 2015-10-27 DIAGNOSIS — Z79899 Other long term (current) drug therapy: Secondary | ICD-10-CM

## 2015-10-27 DIAGNOSIS — G9341 Metabolic encephalopathy: Secondary | ICD-10-CM | POA: Diagnosis present

## 2015-10-27 DIAGNOSIS — D638 Anemia in other chronic diseases classified elsewhere: Secondary | ICD-10-CM | POA: Diagnosis present

## 2015-10-27 DIAGNOSIS — I5033 Acute on chronic diastolic (congestive) heart failure: Secondary | ICD-10-CM | POA: Diagnosis present

## 2015-10-27 DIAGNOSIS — J9611 Chronic respiratory failure with hypoxia: Secondary | ICD-10-CM | POA: Diagnosis not present

## 2015-10-27 DIAGNOSIS — J470 Bronchiectasis with acute lower respiratory infection: Secondary | ICD-10-CM | POA: Diagnosis present

## 2015-10-27 DIAGNOSIS — Z833 Family history of diabetes mellitus: Secondary | ICD-10-CM

## 2015-10-27 DIAGNOSIS — J189 Pneumonia, unspecified organism: Secondary | ICD-10-CM | POA: Diagnosis present

## 2015-10-27 DIAGNOSIS — I509 Heart failure, unspecified: Secondary | ICD-10-CM

## 2015-10-27 DIAGNOSIS — E039 Hypothyroidism, unspecified: Secondary | ICD-10-CM | POA: Diagnosis present

## 2015-10-27 DIAGNOSIS — Z7984 Long term (current) use of oral hypoglycemic drugs: Secondary | ICD-10-CM

## 2015-10-27 DIAGNOSIS — Z23 Encounter for immunization: Secondary | ICD-10-CM

## 2015-10-27 DIAGNOSIS — A419 Sepsis, unspecified organism: Principal | ICD-10-CM | POA: Diagnosis present

## 2015-10-27 DIAGNOSIS — R5081 Fever presenting with conditions classified elsewhere: Secondary | ICD-10-CM | POA: Diagnosis present

## 2015-10-27 DIAGNOSIS — Z9981 Dependence on supplemental oxygen: Secondary | ICD-10-CM

## 2015-10-27 DIAGNOSIS — Z950 Presence of cardiac pacemaker: Secondary | ICD-10-CM

## 2015-10-27 DIAGNOSIS — Y95 Nosocomial condition: Secondary | ICD-10-CM | POA: Diagnosis present

## 2015-10-27 LAB — DIFFERENTIAL
BASOS ABS: 0 10*3/uL (ref 0.0–0.1)
BASOS PCT: 0 %
EOS ABS: 0 10*3/uL (ref 0.0–0.7)
Eosinophils Relative: 0 %
LYMPHS PCT: 27 %
Lymphs Abs: 0.2 10*3/uL — ABNORMAL LOW (ref 0.7–4.0)
Monocytes Absolute: 0 10*3/uL — ABNORMAL LOW (ref 0.1–1.0)
Monocytes Relative: 1 %
Neutro Abs: 0.6 10*3/uL — ABNORMAL LOW (ref 1.7–7.7)
Neutrophils Relative %: 72 %

## 2015-10-27 LAB — GLUCOSE, CAPILLARY
GLUCOSE-CAPILLARY: 226 mg/dL — AB (ref 65–99)
GLUCOSE-CAPILLARY: 205 mg/dL — AB (ref 65–99)
GLUCOSE-CAPILLARY: 160 mg/dL — AB (ref 65–99)
GLUCOSE-CAPILLARY: 223 mg/dL — AB (ref 65–99)

## 2015-10-27 LAB — URINALYSIS, ROUTINE W REFLEX MICROSCOPIC
Bilirubin Urine: NEGATIVE
Glucose, UA: NEGATIVE mg/dL
Hgb urine dipstick: NEGATIVE
KETONES UR: 40 mg/dL — AB
LEUKOCYTES UA: NEGATIVE
NITRITE: NEGATIVE
PROTEIN: NEGATIVE mg/dL
Specific Gravity, Urine: 1.026 (ref 1.005–1.030)
pH: 6 (ref 5.0–8.0)

## 2015-10-27 LAB — TROPONIN I: Troponin I: <0.03 ng/mL (ref <0.03)

## 2015-10-27 LAB — CBC
HCT: 23 % — ABNORMAL LOW (ref 39.0–52.0)
Hemoglobin: 8 g/dL — ABNORMAL LOW (ref 13.0–17.0)
MCH: 33.1 pg (ref 26.0–34.0)
MCHC: 34.8 g/dL (ref 30.0–36.0)
MCV: 95 fL (ref 78.0–100.0)
PLATELETS: 44 10*3/uL — AB (ref 150–400)
RBC: 2.42 MIL/uL — ABNORMAL LOW (ref 4.22–5.81)
RDW: 20 % — AB (ref 11.5–15.5)
WBC: 0.8 10*3/uL — AB (ref 4.0–10.5)

## 2015-10-27 LAB — COMPREHENSIVE METABOLIC PANEL
ALBUMIN: 3.6 g/dL (ref 3.5–5.0)
ALT: 36 U/L (ref 17–63)
ANION GAP: 9 (ref 5–15)
AST: 29 U/L (ref 15–41)
Alkaline Phosphatase: 48 U/L (ref 38–126)
BILIRUBIN TOTAL: 1.2 mg/dL (ref 0.3–1.2)
BUN: 15 mg/dL (ref 6–20)
CO2: 24 mmol/L (ref 22–32)
Calcium: 9.2 mg/dL (ref 8.9–10.3)
Chloride: 100 mmol/L — ABNORMAL LOW (ref 101–111)
Creatinine, Ser: 0.76 mg/dL (ref 0.61–1.24)
GFR calc Af Amer: >60 mL/min (ref >60)
GFR calc non Af Amer: >60 mL/min (ref >60)
GLUCOSE: 230 mg/dL — AB (ref 65–99)
POTASSIUM: 3.3 mmol/L — AB (ref 3.5–5.1)
SODIUM: 133 mmol/L — AB (ref 135–145)
TOTAL PROTEIN: 6.7 g/dL (ref 6.5–8.1)

## 2015-10-27 LAB — CBG MONITORING, ED: Glucose-Capillary: 254 mg/dL — ABNORMAL HIGH (ref 65–99)

## 2015-10-27 LAB — BRAIN NATRIURETIC PEPTIDE: B NATRIURETIC PEPTIDE 5: 184.5 pg/mL — AB (ref 0.0–100.0)

## 2015-10-27 LAB — PREPARE RBC (CROSSMATCH)

## 2015-10-27 MED ORDER — SODIUM CHLORIDE 0.9% FLUSH: 3.0000 mL | INTRAVENOUS | Status: DC | PRN

## 2015-10-27 MED ORDER — ALBUTEROL SULFATE (2.5 MG/3ML) 0.083% IN NEBU
2.5000 mg | INHALATION_SOLUTION | RESPIRATORY_TRACT | Status: DC | PRN
  Administered 2015-10-29: 2.5 mg via RESPIRATORY_TRACT
  Filled 2015-10-27: qty 3

## 2015-10-27 MED ORDER — DM-GUAIFENESIN ER 30-600 MG PO TB12
1.0000 | ORAL_TABLET | Freq: Two times a day (BID) | ORAL | Status: DC | PRN
Start: 2015-10-27 — End: 2015-11-02
  Administered 2015-10-31: 2 via ORAL
  Filled 2015-10-27: qty 2

## 2015-10-27 MED ORDER — IOPAMIDOL (ISOVUE-370) INJECTION 76%
100.0000 mL | Freq: Once | INTRAVENOUS | Status: AC | PRN
  Administered 2015-10-27: 100 mL via INTRAVENOUS

## 2015-10-27 MED ORDER — ACETAMINOPHEN 325 MG PO TABS
650.0000 mg | ORAL_TABLET | Freq: Four times a day (QID) | ORAL | Status: DC | PRN
  Administered 2015-10-27 – 2015-10-30 (×3): 650 mg via ORAL
  Filled 2015-10-27 (×3): qty 2

## 2015-10-27 MED ORDER — ADULT MULTIVITAMIN W/MINERALS CH
1.0000 | ORAL_TABLET | Freq: Every day | ORAL | Status: DC
  Administered 2015-10-27 – 2015-11-02 (×7): 1 via ORAL
  Filled 2015-10-27 (×7): qty 1

## 2015-10-27 MED ORDER — FINASTERIDE 5 MG PO TABS
5.0000 mg | ORAL_TABLET | Freq: Every morning | ORAL | Status: DC
  Administered 2015-10-27 – 2015-11-02 (×7): 5 mg via ORAL
  Filled 2015-10-27 (×8): qty 1

## 2015-10-27 MED ORDER — OXYBUTYNIN CHLORIDE ER 5 MG PO TB24
10.0000 mg | ORAL_TABLET | Freq: Every day | ORAL | Status: DC
  Administered 2015-10-27 – 2015-11-02 (×7): 10 mg via ORAL
  Filled 2015-10-27 (×7): qty 2

## 2015-10-27 MED ORDER — INSULIN ASPART 100 UNIT/ML ~~LOC~~ SOLN
0.0000 [IU] | Freq: Three times a day (TID) | SUBCUTANEOUS | Status: DC
  Administered 2015-10-27: 2 [IU] via SUBCUTANEOUS
  Administered 2015-10-27: 3 [IU] via SUBCUTANEOUS
  Administered 2015-10-28 (×2): 2 [IU] via SUBCUTANEOUS
  Administered 2015-10-28 – 2015-10-29 (×3): 3 [IU] via SUBCUTANEOUS
  Administered 2015-10-29 – 2015-10-30 (×3): 2 [IU] via SUBCUTANEOUS
  Administered 2015-10-30: 3 [IU] via SUBCUTANEOUS
  Administered 2015-10-31 (×2): 2 [IU] via SUBCUTANEOUS
  Administered 2015-10-31 – 2015-11-01 (×2): 5 [IU] via SUBCUTANEOUS
  Administered 2015-11-01: 2 [IU] via SUBCUTANEOUS
  Administered 2015-11-01 – 2015-11-02 (×2): 3 [IU] via SUBCUTANEOUS
  Administered 2015-11-02: 2 [IU] via SUBCUTANEOUS

## 2015-10-27 MED ORDER — TRAZODONE HCL 50 MG PO TABS
150.0000 mg | ORAL_TABLET | Freq: Every day | ORAL | Status: DC
  Administered 2015-10-27 – 2015-11-01 (×6): 150 mg via ORAL
  Filled 2015-10-27 (×6): qty 3

## 2015-10-27 MED ORDER — SODIUM CHLORIDE 0.9% FLUSH
3.0000 mL | Freq: Two times a day (BID) | INTRAVENOUS | Status: DC
  Administered 2015-10-27 – 2015-11-01 (×7): 3 mL via INTRAVENOUS

## 2015-10-27 MED ORDER — LEVOTHYROXINE SODIUM 25 MCG PO TABS
25.0000 ug | ORAL_TABLET | Freq: Every day | ORAL | Status: DC
  Administered 2015-10-27 – 2015-11-02 (×7): 25 ug via ORAL
  Filled 2015-10-27 (×7): qty 1

## 2015-10-27 MED ORDER — SODIUM CHLORIDE 0.9% FLUSH
3.0000 mL | Freq: Two times a day (BID) | INTRAVENOUS | Status: DC
  Administered 2015-10-28 – 2015-11-01 (×7): 3 mL via INTRAVENOUS

## 2015-10-27 MED ORDER — POTASSIUM CHLORIDE CRYS ER 20 MEQ PO TBCR
40.0000 meq | EXTENDED_RELEASE_TABLET | Freq: Two times a day (BID) | ORAL | Status: DC
  Administered 2015-10-27 – 2015-11-02 (×13): 40 meq via ORAL
  Filled 2015-10-27 (×13): qty 2

## 2015-10-27 MED ORDER — POLYETHYLENE GLYCOL 3350 17 G PO PACK
17.0000 g | PACK | Freq: Every day | ORAL | Status: DC | PRN
  Administered 2015-10-31: 17 g via ORAL
  Filled 2015-10-27: qty 1

## 2015-10-27 MED ORDER — INFLUENZA VAC SPLIT QUAD 0.5 ML IM SUSY
0.5000 mL | PREFILLED_SYRINGE | INTRAMUSCULAR | Status: AC
  Administered 2015-10-28: 0.5 mL via INTRAMUSCULAR
  Filled 2015-10-27: qty 0.5

## 2015-10-27 MED ORDER — MOMETASONE FURO-FORMOTEROL FUM 200-5 MCG/ACT IN AERO
2.0000 | INHALATION_SPRAY | Freq: Two times a day (BID) | RESPIRATORY_TRACT | Status: DC
  Administered 2015-10-27 – 2015-11-02 (×13): 2 via RESPIRATORY_TRACT
  Filled 2015-10-27: qty 8.8

## 2015-10-27 MED ORDER — FUROSEMIDE 10 MG/ML IJ SOLN
40.0000 mg | Freq: Once | INTRAMUSCULAR | Status: AC
  Administered 2015-10-27: 40 mg via INTRAVENOUS
  Filled 2015-10-27: qty 4

## 2015-10-27 MED ORDER — ALPRAZOLAM 0.25 MG PO TABS
0.2500 mg | ORAL_TABLET | Freq: Every evening | ORAL | Status: DC | PRN
  Administered 2015-10-28 – 2015-11-01 (×4): 0.25 mg via ORAL
  Filled 2015-10-27 (×4): qty 1

## 2015-10-27 MED ORDER — FAMOTIDINE 20 MG PO TABS
20.0000 mg | ORAL_TABLET | Freq: Every day | ORAL | Status: DC
  Administered 2015-10-27 – 2015-11-01 (×6): 20 mg via ORAL
  Filled 2015-10-27 (×6): qty 1

## 2015-10-27 MED ORDER — FUROSEMIDE 40 MG PO TABS
40.0000 mg | ORAL_TABLET | Freq: Every day | ORAL | Status: DC
  Administered 2015-10-28: 40 mg via ORAL
  Filled 2015-10-27: qty 1

## 2015-10-27 MED ORDER — FLUTICASONE PROPIONATE 50 MCG/ACT NA SUSP
2.0000 | Freq: Two times a day (BID) | NASAL | Status: DC
  Administered 2015-10-27 – 2015-11-01 (×12): 2 via NASAL
  Filled 2015-10-27: qty 16

## 2015-10-27 MED ORDER — DOCUSATE SODIUM 100 MG PO CAPS: 100.0000 mg | ORAL_CAPSULE | Freq: Every day | ORAL | Status: DC | PRN

## 2015-10-27 MED ORDER — PANTOPRAZOLE SODIUM 40 MG PO TBEC
40.0000 mg | DELAYED_RELEASE_TABLET | Freq: Every day | ORAL | Status: DC
  Administered 2015-10-27 – 2015-11-02 (×7): 40 mg via ORAL
  Filled 2015-10-27 (×7): qty 1

## 2015-10-27 MED ORDER — ONDANSETRON HCL 4 MG/2ML IJ SOLN
4.0000 mg | Freq: Four times a day (QID) | INTRAMUSCULAR | Status: DC | PRN
  Administered 2015-10-31: 4 mg via INTRAVENOUS
  Filled 2015-10-27: qty 2

## 2015-10-27 MED ORDER — ACETAMINOPHEN 650 MG RE SUPP: 650.0000 mg | Freq: Four times a day (QID) | RECTAL | Status: DC | PRN

## 2015-10-27 MED ORDER — SODIUM CHLORIDE 0.9 % IV SOLN
10.0000 mL/h | Freq: Once | INTRAVENOUS | Status: AC
  Administered 2015-10-27: 10 mL/h via INTRAVENOUS

## 2015-10-27 MED ORDER — ALBUTEROL SULFATE HFA 108 (90 BASE) MCG/ACT IN AERS: 2.0000 | INHALATION_SPRAY | RESPIRATORY_TRACT | Status: DC | PRN

## 2015-10-27 MED ORDER — SODIUM CHLORIDE 0.9 % IV SOLN: 250.0000 mL | INTRAVENOUS | Status: DC | PRN

## 2015-10-27 MED ORDER — ENSURE ENLIVE PO LIQD
237.0000 mL | Freq: Two times a day (BID) | ORAL | Status: DC
  Administered 2015-10-27 – 2015-11-01 (×10): 237 mL via ORAL

## 2015-10-27 NOTE — ED Notes (Signed)
Patient transported to XR. 

## 2015-10-27 NOTE — ED Notes (Signed)
Requested patient to urinate. 

## 2015-10-27 NOTE — ED Notes (Signed)
Bed: WA04 Expected date:  Expected time:  Means of arrival:  Comments: EMS cancer patient

## 2015-10-27 NOTE — ED Notes (Signed)
Amy from lab called, WBC 0.8 informed RN and MD.

## 2015-10-27 NOTE — ED Notes (Signed)
Patient assisted to stand up to urinate by tech.

## 2015-10-27 NOTE — ED Triage Notes (Signed)
Trying to get off commode and felt weak and went to floor no complaints of pain voiced alert and talking increased blood sugar and decreased urinary output.

## 2015-10-27 NOTE — ED Provider Notes (Signed)
[facility] DEPT Provider Note   CSN: [id number] Arrival date & time: 10/27/15  0111  By signing my name below, I, [name], attest that this documentation has been prepared under the direction and in the presence of Orpah Greek, MD . Electronically Signed: [name], [place]. 10/27/2015. 1:29 AM.  History   Chief Complaint Chief Complaint  Patient presents with  . Weakness  . Fall   The history is provided by the patient and medical records. No language interpreter was used.     HPI Comments:  [name] is a 82 y.o. male who presents to the Emergency Department complaining of weakness since yesterday morning.  Pt adds that he's had a cough for a little while and has had decreased urine output since yesterday.  Pt states he was on the commode and had a large bowel movement; when he went to stand up he felt weak and had to sit back down.  He denies fall and head injury as well as vomiting, diarrhea, blood in his stool and abnormal BLE swelling.  Pt uses O2 at night and occasionally during the day.    Past Medical History:  Diagnosis Date  . Anemia   . CHF (congestive heart failure) ([place])   . Diabetes mellitus without complication ([place])   . Paroxysmal atrial fibrillation ([place]) 03/16/11   diagnosed by PPM interrogation  . Second degree Mobitz II AV block 12/11/08   with transient syncope, resolved s/p PPM    Patient Active Problem List   Diagnosis Date Noted  . Chronic respiratory failure with hypoxia ([place]) 10/08/2015  . HCAP (healthcare-associated pneumonia) 09/10/2015  . Bronchitis 07/01/2015  . Chest pain 06/30/2015  . Port catheter in place 05/22/2015  . Dyspnea 05/13/2015  . Bronchitis, chronic obstructive w acute bronchitis ([place]) 04/12/2015  . Cough variant asthma 03/05/2015  . Urinary frequency 02/12/2015  . Fungal infection 01/31/2015  . Sepsis due to pneumonia ([place]) 01/18/2015  . Lobar pneumonia, unspecified organism ([place])  01/18/2015  . Neutropenia ([place]) 01/18/2015  . Thrombocytopenia ([place]) 01/18/2015  . Anemia of chronic disease 01/18/2015  . Pacemaker 01/18/2015  . Chronic diastolic CHF (congestive heart failure) ([place]) 01/18/2015  . MDS (myelodysplastic syndrome) ([place])   . Pancytopenia ([place]) 12/06/2012  . Lactic acidosis 10/28/2012    Past Surgical History:  Procedure Laterality Date  . BI-VENTRICULAR PACEMAKER UPGRADE N/A 12/29/2012   Procedure: BI-VENTRICULAR PACEMAKER UPGRADE;  Surgeon: Evans Lance, MD;  Location: [facility] CATH LAB;  Service: Cardiovascular;  Laterality: N/A;  . INSERT / REPLACE / REMOVE PACEMAKER  12/29/2012  . LYMPH NODE BIOPSY  04/2005   groin  . PACEMAKER INSERTION  12/11/08   MDT implanted by Dr Rayann Heman  . PACEMAKER LEAD REMOVAL Left 11/04/2012   Procedure: PACEMAKER LEAD REMOVAL;  Surgeon: Evans Lance, MD;  Location: [facility];  Service: Cardiovascular;  Laterality: Left;  . PENILE PROSTHESIS IMPLANT     and removal  . PERICARDIECTOMY  2000  . STERNOTOMY  2000  . TEE WITHOUT CARDIOVERSION N/A 10/31/2012   Procedure: TRANSESOPHAGEAL ECHOCARDIOGRAM (TEE);  Surgeon: Lelon Perla, MD;  Location: [facility] ENDOSCOPY;  Service: Cardiovascular;  Laterality: N/A;  . TEMPORARY PACEMAKER INSERTION Left 11/04/2012   Procedure: TEMPORARY PACEMAKER INSERTION;  Surgeon: Evans Lance, MD;  Location: [facility];  Service: Cardiovascular;  Laterality: Left;       Home Medications    Prior to Admission medications   Medication Sig Start Date End Date Taking? Authorizing Provider  albuterol (  PROVENTIL HFA;VENTOLIN HFA) 108 (90 Base) MCG/ACT inhaler Inhale 2 puffs into the lungs every 4 (four) hours as needed for wheezing or shortness of breath.   Yes Historical Provider, MD  albuterol (PROVENTIL) (2.5 MG/3ML) 0.083% nebulizer solution Take 3 mLs (2.5 mg total) by nebulization every 4 (four) hours as needed for wheezing or shortness of breath. 01/29/15  Yes Tanda Rockers, MD  ALPRAZolam Duanne Moron) 0.25 MG  tablet Take 0.25 mg by mouth at bedtime as needed for anxiety or sleep.   Yes Historical Provider, MD  calcium-vitamin D (OS-CAL 500 + D) 500-200 MG-UNIT per tablet Take 1 tablet by mouth 2 (two) times daily.    Yes Historical Provider, MD  chlorpheniramine (CHLOR-TRIMETON) 4 MG tablet Take 4 mg by mouth every 4 (four) hours as needed (drippy nose).   Yes Historical Provider, MD  dextromethorphan-guaiFENesin (MUCINEX DM) 30-600 MG 12hr tablet Take 1-2 tablets by mouth 2 (two) times daily as needed for cough.   Yes Historical Provider, MD  docusate sodium (COLACE) 100 MG capsule Take 100 mg by mouth daily as needed for mild constipation or moderate constipation.    Yes Historical Provider, MD  famotidine (PEPCID) 20 MG tablet Take 20 mg by mouth at bedtime.    Yes Historical Provider, MD  finasteride (PROSCAR) 5 MG tablet TAKE 1 TABLET BY MOUTH EVERY MORNING Patient taking differently: TAKE 5 MG BY MOUTH EVERY MORNING 07/18/15  Yes Tanda Rockers, MD  fluticasone (FLONASE) 50 MCG/ACT nasal spray 1-2 puffs each nostril once or twice daily for allergy Patient taking differently: Place 2 sprays into both nostrils 2 (two) times daily.  04/12/15  Yes Deneise Lever, MD  furosemide (LASIX) 40 MG tablet TAKE 1 TABLET BY MOUTH EVERY DAY MAY TAKE 1 EXTRA TABLET DAILY IF NEEDED FOR SWELLING IN LEGS 10/07/15  Yes Tanda Rockers, MD  hydrOXYzine (ATARAX/VISTARIL) 25 MG tablet Take 1-2 tabs by mouth every 4-6 hours as needed for itching   Yes Historical Provider, MD  KLOR-CON M20 20 MEQ tablet TAKE 1 TABLET DAILY Patient taking differently: TAKE 20 MEQ BY MOUTH DAILY 07/18/15  Yes Tanda Rockers, MD  levothyroxine (SYNTHROID, LEVOTHROID) 25 MCG tablet TAKE 1 TABLET BY MOUTH EVERY MORNING BEFORE BREAKFAST Patient taking differently: TAKE 25 MCG BY MOUTH EVERY MORNING BEFORE BREAKFAST 02/18/15  Yes Tanda Rockers, MD  metFORMIN (GLUCOPHAGE) 500 MG tablet TAKE 1 TABLET BY MOUTH TWICE A DAY WITH FOOD Patient taking  differently: TAKE 250 MG BY MOUTH TWICE A DAY WITH FOOD 05/22/15  Yes Tanda Rockers, MD  mometasone-formoterol (DULERA) 200-5 MCG/ACT AERO Inhale 2 puffs into the lungs every 12 (twelve) hours.   Yes Historical Provider, MD  Multiple Vitamin (MULTIVITAMIN) tablet Take 1 tablet by mouth every morning.    Yes Historical Provider, MD  omeprazole (PRILOSEC) 20 MG capsule Take 40 mg by mouth daily before breakfast.    Yes Historical Provider, MD  oxybutynin (DITROPAN-XL) 10 MG 24 hr tablet Take 10 mg by mouth every morning. Reported on 05/13/2015   Yes Historical Provider, MD  OXYGEN 2 lpm with sleep   Yes Historical Provider, MD  polyethylene glycol (MIRALAX / GLYCOLAX) packet Take 17 g by mouth daily as needed for moderate constipation or severe constipation.    Yes Historical Provider, MD  traMADol (ULTRAM) 50 MG tablet Take 1-1/2 tablet every 4 hours as needed for severe cough and pain   Yes Historical Provider, MD  traZODone (DESYREL) 150 MG tablet Take  1 tablet (150 mg total) by mouth at bedtime. 07/03/15  Yes Bonnell Public, MD  nitroGLYCERIN (NITROSTAT) 0.4 MG SL tablet Place 1 tablet (0.4 mg total) under the tongue every 5 (five) minutes as needed for chest pain. Patient not taking: Reported on 10/09/2015 06/04/14   Thompson Grayer, MD    Family History Family History  Problem Relation Age of Onset  . Diabetes Mother   . Liver cancer Brother   . Cancer Sister     Social History Social History  Substance Use Topics  . Smoking status: Former Smoker    Packs/day: 1.00    Years: 30.00    Types: Cigarettes    Quit date: 01/27/1968  . Smokeless tobacco: Former Systems developer    Types: Chew  . Alcohol use No     Allergies   Review of patient's allergies indicates no known allergies.   Review of Systems Review of Systems  Neurological: Positive for weakness.     Physical Exam Updated Vital Signs BP 107/55 (BP Location: Right Arm)   Pulse 112   Temp 98.8 F (37.1 C) (Oral)   Resp 22    Ht 6\' 1"  (1.854 m)   Wt 226 lb (102.5 kg)   SpO2 98%   BMI 29.82 kg/m   Physical Exam  Constitutional: He is oriented to person, place, and time. He appears well-developed and well-nourished. No distress.  HENT:  Head: Normocephalic and atraumatic.  Right Ear: Hearing normal.  Left Ear: Hearing normal.  Nose: Nose normal.  Mouth/Throat: Oropharynx is clear and moist and mucous membranes are normal.  Eyes: Conjunctivae and EOM are normal. Pupils are equal, round, and reactive to light.  Neck: Normal range of motion. Neck supple.  Cardiovascular: Regular rhythm, S1 normal and S2 normal.  Tachycardia present.  Exam reveals no gallop and no friction rub.   No murmur heard. Pulmonary/Chest: Effort normal. Tachypnea noted. No respiratory distress. He has wheezes. He exhibits no tenderness.  Slight wheeze bilaterally.    Abdominal: Soft. Normal appearance and bowel sounds are normal. There is no hepatosplenomegaly. There is no tenderness. There is no rebound, no guarding, no tenderness at McBurney's point and negative Murphy's sign. No hernia.  Musculoskeletal: Normal range of motion. He exhibits edema (1+ pitting bilaterally).  Neurological: He is alert and oriented to person, place, and time. He has normal strength. No cranial nerve deficit or sensory deficit. Coordination normal. GCS eye subscore is 4. GCS verbal subscore is 5. GCS motor subscore is 6.  Skin: Skin is warm, dry and intact. No rash noted. No cyanosis. There is pallor.  Psychiatric: He has a normal mood and affect. His speech is normal and behavior is normal. Thought content normal.  Nursing note and vitals reviewed.    ED Treatments / Results   DIAGNOSTIC STUDIES:  Oxygen Saturation is 98% on 2 L/min, NML by my interpretation.    COORDINATION OF CARE:  1:29 AM Discussed treatment plan with pt at bedside and pt agreed to plan.  Labs (all labs ordered are listed, but only abnormal results are displayed) Labs Reviewed   CBC - Abnormal; Notable for the following:       Result Value   WBC 0.8 (*)    RBC 2.42 (*)    Hemoglobin 8.0 (*)    HCT 23.0 (*)    RDW 20.0 (*)    Platelets 44 (*)    All other components within normal limits  URINALYSIS, ROUTINE W REFLEX MICROSCOPIC (NOT AT  [facility]) - Abnormal; Notable for the following:    Color, Urine AMBER (*)    APPearance CLOUDY (*)    Ketones, ur 40 (*)    All other components within normal limits  COMPREHENSIVE METABOLIC PANEL - Abnormal; Notable for the following:    Sodium 133 (*)    Potassium 3.3 (*)    Chloride 100 (*)    Glucose, Bld 230 (*)    All other components within normal limits  BRAIN NATRIURETIC PEPTIDE - Abnormal; Notable for the following:    B Natriuretic Peptide 184.5 (*)    All other components within normal limits  DIFFERENTIAL - Abnormal; Notable for the following:    Neutro Abs 0.6 (*)    Lymphs Abs 0.2 (*)    Monocytes Absolute 0.0 (*)    All other components within normal limits  CBG MONITORING, ED - Abnormal; Notable for the following:    Glucose-Capillary 254 (*)    All other components within normal limits  TROPONIN I  TYPE AND SCREEN    EKG  EKG Interpretation  Date/Time:  Sunday October 27 2015 01:18:17 EDT Ventricular Rate:  110 PR Interval:    QRS Duration: 148 QT Interval:  400 QTC Calculation: 542 R Axis:   -92 Text Interpretation:  Ventricular-paced complexes No further analysis attempted due to paced rhythm Confirmed by POLLINA  MD, CHRISTOPHER [phone]) on 10/27/2015 1:59:49 AM       Radiology Dg Chest 2 View  Result Date: 10/27/2015 CLINICAL DATA:  Cough, shortness of breath, weakness, and decreased urine output since yesterday. EXAM: CHEST  2 VIEW COMPARISON:  10/08/2015 FINDINGS: Cardiac pacemaker. Postoperative changes in the mediastinum. Shallow inspiration with atelectasis in the lung bases. Cardiac enlargement with diffuse pulmonary vascular congestion. Slight interstitial pattern to the lung bases  suggesting early edema. Vascular congestion is increasing since previous study. No blunting of costophrenic angles. No pneumothorax. Emphysematous changes in the lungs. Calcified and tortuous aorta IMPRESSION: Cardiac enlargement with increasing pulmonary vascular congestion and early interstitial edema developing since prior study. Electronically Signed   By: Lucienne Capers M.D.   On: 10/27/2015 02:03   Ct Angio Chest Pe W Or Wo Contrast  Result Date: 10/27/2015 CLINICAL DATA:  Shortness of breath. Episode of weakness following a large bowel movement last night. EXAM: CT ANGIOGRAPHY CHEST WITH CONTRAST TECHNIQUE: Multidetector CT imaging of the chest was performed using the standard protocol during bolus administration of intravenous contrast. Multiplanar CT image reconstructions and MIPs were obtained to evaluate the vascular anatomy. CONTRAST:  100 mL Isovue 370 COMPARISON:  09/10/2015 FINDINGS: Cardiovascular: Technically adequate study with good opacification of the central and segmental pulmonary arteries. No focal filling defects demonstrated. No evidence of significant pulmonary embolus. Cardiac enlargement. Reflux of contrast material into the hepatic veins suggests right heart failure. Coronary artery bypass. Calcification of the aorta without aneurysm. Mediastinum/Nodes: Postoperative changes in the mediastinum. Scattered mediastinal lymph nodes are not pathologically enlarged. Esophagus is decompressed. Moderate-sized esophageal hiatal hernia. Lungs/Pleura: Minimal left pleural effusion. Emphysematous changes in the lungs. Diffuse interstitial fibrosis. Patchy airspace infiltrates in the left mid and lower lung zones demonstrating improvement since previous study. No pneumothorax. Airways are patent. Upper Abdomen: No acute abnormality. Musculoskeletal: Postoperative median sternotomy. Degenerative changes in the spine. No destructive bone lesions. Review of the MIP images confirms the above  findings. IMPRESSION: No evidence of significant pulmonary embolus. Cardiac enlargement with signs of right heart failure. Emphysematous changes and fibrosis in the lungs with improving airspace infiltrates  since previous study. Minimal left pleural effusion. Electronically Signed   By: Lucienne Capers M.D.   On: 10/27/2015 04:28    Procedures Procedures (including critical care time)  Medications Ordered in ED Medications  iopamidol (ISOVUE-370) 76 % injection 100 mL (100 mLs Intravenous Contrast Given 10/27/15 0407)     Initial Impression / Assessment and Plan / ED Course  I have reviewed the triage vital signs and the nursing notes.  Pertinent labs & imaging results that were available during my care of the patient were reviewed by me and considered in my medical decision making (see chart for details).  Clinical Course   Presents to the emergency department for evaluation of weakness and fall. Patient reports that he went to the bathroom and could not get up off of the commode. When he tried to get up he was so weak he fell to the ground. He denied any injury. Patient brought to the emergency department and noted to be dyspneic and hypoxic. Room air oxygen saturation was 88%. He does report using nighttime oxygen. Examination revealed bilateral pitting edema with mild tachycardia, bilateral slight wheezes and rales. Patient has history of myelodysplasia. Hemoglobin is at 8. This is around the threshold for his hematologist to transfuse him. Patient's weakness and dyspnea is likely multifactorial. I suspect the patient is experiencing some right heart failure. This is confirmed on CT angiography performed to rule out PE. No PE was seen. Patient still extremely weak and dyspneic. Will require hospitalization for blood transfusion, diaphoresis.  Final Clinical Impressions(s) / ED Diagnoses   Final diagnoses:  Generalized weakness  Acute on chronic congestive heart failure, unspecified  congestive heart failure type (HCC)  Other pancytopenia (HCC)    New Prescriptions New Prescriptions   No medications on file   I personally performed the services described in this documentation, which was scribed in my presence. The recorded information has been reviewed and is accurate.    Orpah Greek, MD 10/27/15 [phone]

## 2015-10-27 NOTE — H&P (Addendum)
History and Physical  [name] [id number] DOB: July 14, 1932 DOA: 10/27/2015  Referring physician: Sallyanne Havers PCP: Christinia Gully, MD  Outpatient Specialists:  1. [facility]  Chief Complaint: weakness  HPI: [name] is a 82 y.o. male who presents to the Emergency Department complaining of weakness since yesterday morning.  Pt adds that he's had a cough for a little while and has had decreased urine output since yesterday.  Pt states he was on the commode and had a large bowel movement; when he went to stand up he felt weak and had to sit back down.  He denies fall and head injury as well as vomiting, diarrhea, blood in his stool and abnormal BLE swelling.  Pt uses O2 at night and occasionally during the day.    Review of Systems: All systems reviewed and apart from history of presenting illness, are negative.  Past Medical History:  Diagnosis Date  . Anemia   . CHF (congestive heart failure) ([place])   . Diabetes mellitus without complication ([place])   . Paroxysmal atrial fibrillation ([place]) 03/16/11   diagnosed by PPM interrogation  . Second degree Mobitz II AV block 12/11/08   with transient syncope, resolved s/p PPM   Past Surgical History:  Procedure Laterality Date  . BI-VENTRICULAR PACEMAKER UPGRADE N/A 12/29/2012   Procedure: BI-VENTRICULAR PACEMAKER UPGRADE;  Surgeon: Evans Lance, MD;  Location: [facility] CATH LAB;  Service: Cardiovascular;  Laterality: N/A;  . INSERT / REPLACE / REMOVE PACEMAKER  12/29/2012  . LYMPH NODE BIOPSY  04/2005   groin  . PACEMAKER INSERTION  12/11/08   MDT implanted by Dr Rayann Heman  . PACEMAKER LEAD REMOVAL Left 11/04/2012   Procedure: PACEMAKER LEAD REMOVAL;  Surgeon: Evans Lance, MD;  Location: [place];  Service: Cardiovascular;  Laterality: Left;  . PENILE PROSTHESIS IMPLANT     and removal  . PERICARDIECTOMY  2000  . STERNOTOMY  2000  . TEE WITHOUT CARDIOVERSION N/A 10/31/2012   Procedure: TRANSESOPHAGEAL ECHOCARDIOGRAM (TEE);   Surgeon: Lelon Perla, MD;  Location: [facility] ENDOSCOPY;  Service: Cardiovascular;  Laterality: N/A;  . TEMPORARY PACEMAKER INSERTION Left 11/04/2012   Procedure: TEMPORARY PACEMAKER INSERTION;  Surgeon: Evans Lance, MD;  Location: [place];  Service: Cardiovascular;  Laterality: Left;   Social History:  reports that he quit smoking about 47 years ago. His smoking use included Cigarettes. He has a 30.00 pack-year smoking history. He has quit using smokeless tobacco. His smokeless tobacco use included Chew. He reports that he does not drink alcohol or use drugs.   No Known Allergies  Family History  Problem Relation Age of Onset  . Diabetes Mother   . Liver cancer Brother   . Cancer Sister    Prior to Admission medications   Medication Sig Start Date End Date Taking? Authorizing Provider  albuterol (PROVENTIL HFA;VENTOLIN HFA) 108 (90 Base) MCG/ACT inhaler Inhale 2 puffs into the lungs every 4 (four) hours as needed for wheezing or shortness of breath.   Yes Historical Provider, MD  albuterol (PROVENTIL) (2.5 MG/3ML) 0.083% nebulizer solution Take 3 mLs (2.5 mg total) by nebulization every 4 (four) hours as needed for wheezing or shortness of breath. 01/29/15  Yes Tanda Rockers, MD  ALPRAZolam Duanne Moron) 0.25 MG tablet Take 0.25 mg by mouth at bedtime as needed for anxiety or sleep.   Yes Historical Provider, MD  calcium-vitamin D (OS-CAL 500 + D) 500-200 MG-UNIT per tablet Take 1 tablet by mouth 2 (two)  times daily.    Yes Historical Provider, MD  chlorpheniramine (CHLOR-TRIMETON) 4 MG tablet Take 4 mg by mouth every 4 (four) hours as needed (drippy nose).   Yes Historical Provider, MD  dextromethorphan-guaiFENesin (MUCINEX DM) 30-600 MG 12hr tablet Take 1-2 tablets by mouth 2 (two) times daily as needed for cough.   Yes Historical Provider, MD  docusate sodium (COLACE) 100 MG capsule Take 100 mg by mouth daily as needed for mild constipation or moderate constipation.    Yes Historical Provider, MD   famotidine (PEPCID) 20 MG tablet Take 20 mg by mouth at bedtime.    Yes Historical Provider, MD  finasteride (PROSCAR) 5 MG tablet TAKE 1 TABLET BY MOUTH EVERY MORNING Patient taking differently: TAKE 5 MG BY MOUTH EVERY MORNING 07/18/15  Yes Tanda Rockers, MD  fluticasone (FLONASE) 50 MCG/ACT nasal spray 1-2 puffs each nostril once or twice daily for allergy Patient taking differently: Place 2 sprays into both nostrils 2 (two) times daily.  04/12/15  Yes Deneise Lever, MD  furosemide (LASIX) 40 MG tablet TAKE 1 TABLET BY MOUTH EVERY DAY MAY TAKE 1 EXTRA TABLET DAILY IF NEEDED FOR SWELLING IN LEGS 10/07/15  Yes Tanda Rockers, MD  hydrOXYzine (ATARAX/VISTARIL) 25 MG tablet Take 1-2 tabs by mouth every 4-6 hours as needed for itching   Yes Historical Provider, MD  KLOR-CON M20 20 MEQ tablet TAKE 1 TABLET DAILY Patient taking differently: TAKE 20 MEQ BY MOUTH DAILY 07/18/15  Yes Tanda Rockers, MD  levothyroxine (SYNTHROID, LEVOTHROID) 25 MCG tablet TAKE 1 TABLET BY MOUTH EVERY MORNING BEFORE BREAKFAST Patient taking differently: TAKE 25 MCG BY MOUTH EVERY MORNING BEFORE BREAKFAST 02/18/15  Yes Tanda Rockers, MD  metFORMIN (GLUCOPHAGE) 500 MG tablet TAKE 1 TABLET BY MOUTH TWICE A DAY WITH FOOD Patient taking differently: TAKE 250 MG BY MOUTH TWICE A DAY WITH FOOD 05/22/15  Yes Tanda Rockers, MD  mometasone-formoterol (DULERA) 200-5 MCG/ACT AERO Inhale 2 puffs into the lungs every 12 (twelve) hours.   Yes Historical Provider, MD  Multiple Vitamin (MULTIVITAMIN) tablet Take 1 tablet by mouth every morning.    Yes Historical Provider, MD  omeprazole (PRILOSEC) 20 MG capsule Take 40 mg by mouth daily before breakfast.    Yes Historical Provider, MD  oxybutynin (DITROPAN-XL) 10 MG 24 hr tablet Take 10 mg by mouth every morning. Reported on 05/13/2015   Yes Historical Provider, MD  OXYGEN 2 lpm with sleep   Yes Historical Provider, MD  polyethylene glycol (MIRALAX / GLYCOLAX) packet Take 17 g by mouth  daily as needed for moderate constipation or severe constipation.    Yes Historical Provider, MD  traMADol (ULTRAM) 50 MG tablet Take 1-1/2 tablet every 4 hours as needed for severe cough and pain   Yes Historical Provider, MD  traZODone (DESYREL) 150 MG tablet Take 1 tablet (150 mg total) by mouth at bedtime. 07/03/15  Yes Bonnell Public, MD  nitroGLYCERIN (NITROSTAT) 0.4 MG SL tablet Place 1 tablet (0.4 mg total) under the tongue every 5 (five) minutes as needed for chest pain. Patient not taking: Reported on 10/09/2015 06/04/14   Thompson Grayer, MD   Physical Exam: Vitals:   10/27/15 0345 10/27/15 0400 10/27/15 0525 10/27/15 0638  BP: 119/60 107/71 90/73 (!) 132/103  Pulse: 93 91 94 (!) 107  Resp: 21 22 19 18   Temp:   98.9 F (37.2 C) 98.4 F (36.9 C)  TempSrc:   Oral Oral  SpO2: 100% 100%  100% 93%  Weight:    100.6 kg (221 lb 12.5 oz)  Height:        Constitutional: He is oriented to person, place, and time. He appears well-developed and well-nourished. No distress.  HENT: Head: Normocephalic and atraumatic.  Nose: Nose normal.  Mouth/Throat: Oropharynx is clear and moist and mucous membranes are normal.  Eyes: Conjunctivae and EOM are normal. Pupils are equal, round, and reactive to light.  Neck: Normal range of motion. Neck supple.  Cardiovascular: Regular rhythm, S1 normal and S2 normal.  Tachycardia present.  Exam reveals no gallop and no friction rub.  No murmur heard. Pulmonary/Chest: Effort normal. Tachypnea noted. No respiratory distress. He has wheezes. He exhibits no tenderness. Slight wheeze bilaterally.    Abdominal: Soft. Normal appearance and bowel sounds are normal. There is no hepatosplenomegaly. There is no tenderness. There is no rebound, no guarding, no tenderness at McBurney's point and negative Murphy's sign. No hernia.  Musculoskeletal: Normal range of motion. He exhibits edema (1+ pitting bilaterally).  Neurological: He is alert and oriented to person, place,  and time. He has normal strength. No cranial nerve deficit or sensory deficit. Coordination normal. Skin: Skin is warm, dry and intact. No rash noted. No cyanosis. There is pallor.  Psychiatric: He has a normal mood and affect. His speech is normal and behavior is normal. Thought content normal.    Labs on Admission:  Basic Metabolic Panel:  Recent Labs Lab 10/27/15 0130  NA 133*  K 3.3*  CL 100*  CO2 24  GLUCOSE 230*  BUN 15  CREATININE 0.76  CALCIUM 9.2   Liver Function Tests:  Recent Labs Lab 10/27/15 0130  AST 29  ALT 36  ALKPHOS 48  BILITOT 1.2  PROT 6.7  ALBUMIN 3.6   No results for input(s): LIPASE, AMYLASE in the last 168 hours. No results for input(s): AMMONIA in the last 168 hours. CBC:  Recent Labs Lab 10/23/15 0942 10/27/15 0124  WBC 1.1* 0.8*  NEUTROABS 0.4* 0.6*  HGB 9.8* 8.0*  HCT 29.3* 23.0*  MCV 95.8 95.0  PLT 60* 44*   Cardiac Enzymes:  Recent Labs Lab 10/27/15 0130  TROPONINI <0.03    BNP (last 3 results)  Recent Labs  05/13/15 1043 07/08/15 1520 09/03/15 1445  PROBNP 271.0* 447.0* 535.0*   CBG:  Recent Labs Lab 10/27/15 0130  GLUCAP 254*    Radiological Exams on Admission: Dg Chest 2 View  Result Date: 10/27/2015 CLINICAL DATA:  Cough, shortness of breath, weakness, and decreased urine output since yesterday. EXAM: CHEST  2 VIEW COMPARISON:  10/08/2015 FINDINGS: Cardiac pacemaker. Postoperative changes in the mediastinum. Shallow inspiration with atelectasis in the lung bases. Cardiac enlargement with diffuse pulmonary vascular congestion. Slight interstitial pattern to the lung bases suggesting early edema. Vascular congestion is increasing since previous study. No blunting of costophrenic angles. No pneumothorax. Emphysematous changes in the lungs. Calcified and tortuous aorta IMPRESSION: Cardiac enlargement with increasing pulmonary vascular congestion and early interstitial edema developing since prior study.  Electronically Signed   By: Lucienne Capers M.D.   On: 10/27/2015 02:03   Ct Angio Chest Pe W Or Wo Contrast  Result Date: 10/27/2015 CLINICAL DATA:  Shortness of breath. Episode of weakness following a large bowel movement last night. EXAM: CT ANGIOGRAPHY CHEST WITH CONTRAST TECHNIQUE: Multidetector CT imaging of the chest was performed using the standard protocol during bolus administration of intravenous contrast. Multiplanar CT image reconstructions and MIPs were obtained to evaluate the vascular anatomy.  CONTRAST:  100 mL Isovue 370 COMPARISON:  09/10/2015 FINDINGS: Cardiovascular: Technically adequate study with good opacification of the central and segmental pulmonary arteries. No focal filling defects demonstrated. No evidence of significant pulmonary embolus. Cardiac enlargement. Reflux of contrast material into the hepatic veins suggests right heart failure. Coronary artery bypass. Calcification of the aorta without aneurysm. Mediastinum/Nodes: Postoperative changes in the mediastinum. Scattered mediastinal lymph nodes are not pathologically enlarged. Esophagus is decompressed. Moderate-sized esophageal hiatal hernia. Lungs/Pleura: Minimal left pleural effusion. Emphysematous changes in the lungs. Diffuse interstitial fibrosis. Patchy airspace infiltrates in the left mid and lower lung zones demonstrating improvement since previous study. No pneumothorax. Airways are patent. Upper Abdomen: No acute abnormality. Musculoskeletal: Postoperative median sternotomy. Degenerative changes in the spine. No destructive bone lesions. Review of the MIP images confirms the above findings. IMPRESSION: No evidence of significant pulmonary embolus. Cardiac enlargement with signs of right heart failure. Emphysematous changes and fibrosis in the lungs with improving airspace infiltrates since previous study. Minimal left pleural effusion. Electronically Signed   By: Lucienne Capers M.D.   On: 10/27/2015 04:28     EKG: Independently reviewed.   Assessment/Plan Active Problems:   Right heart failure   Other pancytopenia (HCC)   MDS (myelodysplastic syndrome) (HCC)   Chronic respiratory failure with hypoxia (HCC)   Acute on chronic congestive heart failure (HCC)   Generalized weakness   Symptomatic anemia   Acute CHF (congestive heart failure) ([place])  1. Generalized weakness - suspect secondary to anemia - ordered to transfuse 1 unit PRBC, recheck Hg in AM.  Pt declined PT evaluation.  2. Symptomatic anemia - transfuse 1 unit PRBC as noted above. 3. Acute on chronic CHF with right heart failure - lasix 40 mg IV given in ED, monitor diuresis, I/O, weight, resume home oral furosemide 10/2.   4. MDS with pancytopenia -pt is followed in the oncology center by Dr. [name].  5. Hypothyroidism - resume home thyroid replacement medication.  6. Diabetes Mellitus, type 2 - hold home oral meds, sliding scale coverage ordered, accu-checks.   7. Chronic respiratory failure - resume continuous oxygen and home respiratory medications.     DVT Prophylaxis: SCDs Code Status: DNR  Family Communication:   Disposition Plan: Home in 1-2 days   Irwin Brakeman, MD [facility] Pager [phone]  If 7PM-7AM, please contact night-coverage [email or web address] Password [email or web address] 10/27/2015, 8:13 AM

## 2015-10-28 ENCOUNTER — Encounter (HOSPITAL_COMMUNITY): Payer: Medicare Other

## 2015-10-28 DIAGNOSIS — D469 Myelodysplastic syndrome, unspecified: Secondary | ICD-10-CM | POA: Diagnosis not present

## 2015-10-28 DIAGNOSIS — I5031 Acute diastolic (congestive) heart failure: Secondary | ICD-10-CM | POA: Diagnosis not present

## 2015-10-28 DIAGNOSIS — R531 Weakness: Secondary | ICD-10-CM | POA: Diagnosis not present

## 2015-10-28 LAB — COMPREHENSIVE METABOLIC PANEL
ALBUMIN: 3.3 g/dL — AB (ref 3.5–5.0)
ALK PHOS: 43 U/L (ref 38–126)
ALT: 28 U/L (ref 17–63)
AST: 21 U/L (ref 15–41)
Anion gap: 7 (ref 5–15)
BILIRUBIN TOTAL: 0.8 mg/dL (ref 0.3–1.2)
BUN: 19 mg/dL (ref 6–20)
CALCIUM: 9.3 mg/dL (ref 8.9–10.3)
CO2: 30 mmol/L (ref 22–32)
Chloride: 98 mmol/L — ABNORMAL LOW (ref 101–111)
Creatinine, Ser: 0.74 mg/dL (ref 0.61–1.24)
GFR calc Af Amer: >60 mL/min (ref >60)
GLUCOSE: 179 mg/dL — AB (ref 65–99)
POTASSIUM: 4.4 mmol/L (ref 3.5–5.1)
Sodium: 135 mmol/L (ref 135–145)
TOTAL PROTEIN: 6.7 g/dL (ref 6.5–8.1)

## 2015-10-28 LAB — CBC
HEMATOCRIT: 24 % — AB (ref 39.0–52.0)
Hemoglobin: 8.1 g/dL — ABNORMAL LOW (ref 13.0–17.0)
MCH: 32 pg (ref 26.0–34.0)
MCHC: 33.8 g/dL (ref 30.0–36.0)
MCV: 94.9 fL (ref 78.0–100.0)
PLATELETS: 47 10*3/uL — AB (ref 150–400)
RBC: 2.53 MIL/uL — ABNORMAL LOW (ref 4.22–5.81)
RDW: 20.2 % — AB (ref 11.5–15.5)
WBC: 1.1 10*3/uL — AB (ref 4.0–10.5)

## 2015-10-28 LAB — GLUCOSE, CAPILLARY
GLUCOSE-CAPILLARY: 195 mg/dL — AB (ref 65–99)
GLUCOSE-CAPILLARY: 198 mg/dL — AB (ref 65–99)
GLUCOSE-CAPILLARY: 217 mg/dL — AB (ref 65–99)
GLUCOSE-CAPILLARY: 225 mg/dL — AB (ref 65–99)

## 2015-10-28 LAB — MRSA PCR SCREENING: MRSA BY PCR: NEGATIVE

## 2015-10-28 NOTE — Care Management Note (Signed)
Case Management Note  Patient Details  Name: [name] MRN: [id number] Date of Birth: 12/31/1932  Subjective/Objective:83 y/o m admitted w/CHF. From home. Has home 02-[facility]. Has pcp,pharmacy.                    Action/Plan:d/c plan home.   Expected Discharge Date:                  Expected Discharge Plan:  Home/Self Care  In-House Referral:     Discharge planning Services  CM Consult  Post Acute Care Choice:    Choice offered to:     DME Arranged:    DME Agency:     HH Arranged:    HH Agency:     Status of Service:  In process, will continue to follow  If discussed at Long Length of Stay Meetings, dates discussed:    Additional Comments:  Dessa Phi, RN 10/28/2015, 1:43 PM

## 2015-10-28 NOTE — Care Management Obs Status (Signed)
[place] NOTIFICATION   Patient Details  Name: [name] MRN: [id number] Date of Birth: [birth year]   Medicare Observation Status Notification Given:  Yes    MahabirJuliann Pulse, RN 10/28/2015, 1:44 PM

## 2015-10-28 NOTE — Care Management Note (Signed)
Case Management Note  Patient Details  Name: SELDON ILLES MRN: [id number] Date of Birth: May 31, 1932  Subjective/Objective:  Patient states he uses [facility] currently. TC [facility] rep cheryl-await confirmation of HHPT.PT-recc HHPT-await HHPT,f45f order.                  Action/Plan:d/c home w/HHPT   Expected Discharge Date:                  Expected Discharge Plan:  [place]  In-House Referral:     Discharge planning Services  CM Consult  Post Acute Care Choice:  Home Health (Active w/[facility]) Choice offered to:  Patient  DME Arranged:    DME Agency:     HH Arranged:    [place] Agency:     Status of Service:  In process, will continue to follow  If discussed at Long Length of Stay Meetings, dates discussed:    Additional Comments:  Dessa Phi, RN 10/28/2015, 3:57 PM

## 2015-10-28 NOTE — Evaluation (Signed)
Physical Therapy Evaluation Patient Details Name: [name] MRN: [id number] DOB: 05/23/1932 Today's Date: 10/28/2015   History of Present Illness  82 yo male admitted with CHF, weakness. Hx of MDS, DM, A fib, 2* Mobitz  Clinical Impression  On eval, pt required Min assist for mobility. He walked ~35 feet with a RW. Distance was limited by general weakness and fatigue. Pt remained on [place] O2 during session. Will follow and progress activity. Feel pt may need increased assistance at home at discharge. If pt is unable to have assistance at home, may need ST rehab at SNF if he is agreeable.     Follow Up Recommendations Home health PT;Supervision/Assistance - 24 hour; Home Health Aide    Equipment Recommendations  None recommended by PT    Recommendations for Other Services OT consult     Precautions / Restrictions Precautions Precautions: Fall Restrictions Weight Bearing Restrictions: No      Mobility  Bed Mobility Overal bed mobility: Needs Assistance Bed Mobility: Supine to Sit     Supine to sit: Min guard;HOB elevated     General bed mobility comments: close guard for safety. Increased time. HOB 60 degrees.   Transfers Overall transfer level: Needs assistance Equipment used: Rolling walker (2 wheeled) Transfers: Sit to/from Stand Sit to Stand: Min assist         General transfer comment: small amount of assist to steady.   Ambulation/Gait Ambulation/Gait assistance: Min assist Ambulation Distance (Feet): 35 Feet Assistive device: Rolling walker (2 wheeled) Gait Pattern/deviations: Step-through pattern;Decreased stride length     General Gait Details: small amount of assist to steady. Pt fatigues quickly. Remained on [place] O2 during ambulation.   Stairs            Wheelchair Mobility    Modified Rankin (Stroke Patients Only)       Balance Overall balance assessment: Needs assistance         Standing balance support: Bilateral upper  extremity supported;During functional activity Standing balance-Leahy Scale: Poor Standing balance comment: requires RW                             Pertinent Vitals/Pain Pain Assessment: Faces Faces Pain Scale: Hurts little more Pain Location: back Pain Descriptors / Indicators: Aching;Sore Pain Intervention(s): Limited activity within patient's tolerance;Repositioned    Home Living Family/patient expects to be discharged to:: Private residence Living Arrangements: Alone Available Help at Discharge: Available PRN/intermittently;Family Type of Home: House Home Access: Level entry     Home Layout: One level Home Equipment: [place] - 4 wheels;Cane - single point Additional Comments: walks with RW    Prior Function Level of Independence: Independent with assistive device(s)               Hand Dominance        Extremity/Trunk Assessment   Upper Extremity Assessment: Generalized weakness           Lower Extremity Assessment: Generalized weakness      Cervical / Trunk Assessment: Normal  Communication   Communication: HOH  Cognition Arousal/Alertness: Awake/alert Behavior During Therapy: WFL for tasks assessed/performed Overall Cognitive Status: Within Functional Limits for tasks assessed                      General Comments      Exercises     Assessment/Plan    PT Assessment Patient needs continued PT services  PT Problem List  Decreased strength;Decreased mobility;Decreased activity tolerance;Decreased balance;Pain;Decreased knowledge of use of DME          PT Treatment Interventions DME instruction;Gait training;Therapeutic activities;Therapeutic exercise;Balance training;Functional mobility training;Patient/family education    PT Goals (Current goals can be found in the Care Plan section)  Acute Rehab PT Goals Patient Stated Goal: to feel better PT Goal Formulation: With patient/family Time For Goal Achievement:  11/11/15 Potential to Achieve Goals: Good    Frequency Min 3X/week   Barriers to discharge        Co-evaluation               End of Session Equipment Utilized During Treatment: Gait belt;Oxygen Activity Tolerance: Patient limited by fatigue Patient left: in bed;with call bell/phone within reach;with bed alarm set;with family/visitor present      Functional Assessment Tool Used: clinical judgement Functional Limitation: Mobility: Walking and moving around Mobility: Walking and Moving Around Current Status [id number]): At least 1 percent but less than 20 percent impaired, limited or restricted Mobility: Walking and Moving Around Goal Status [phone]): At least 1 percent but less than 20 percent impaired, limited or restricted    Time: 1420-1438 PT Time Calculation (min) (ACUTE ONLY): 18 min   Charges:   PT Evaluation $PT Eval Low Complexity: 1 Procedure     PT G Codes:   PT G-Codes **NOT FOR INPATIENT CLASS** Functional Assessment Tool Used: clinical judgement Functional Limitation: Mobility: Walking and moving around Mobility: Walking and Moving Around Current Status [id number]): At least 1 percent but less than 20 percent impaired, limited or restricted Mobility: Walking and Moving Around Goal Status [phone]): At least 1 percent but less than 20 percent impaired, limited or restricted    Weston Anna, MPT Pager: [phone]

## 2015-10-28 NOTE — Progress Notes (Signed)
Patient ID: [name], male   DOB: 05-Mar-1932, 82 y.o.   MRN: [id number]    PROGRESS NOTE    [name]  [id number] DOB: 1932/05/26 DOA: 10/27/2015  PCP: Christinia Gully, MD   Brief Narrative:   82 y.o. male who presents to the Emergency Department complaining of weakness, fatigue, difficulty standing up.   Assessment & Plan:   Active Problems:   Severe pancytopenia due to MDS (myelodysplastic syndrome) (HCC) - blood counts bit better than yesterday - no indication for transfusions at this time - will ask for repeat blood work in am and if stable, plan to d/c in AM    Weakness and fatigue - due to the above - PT eval requested     Acute on chronic CHF with right heart failure  - lasix 40 mg IV given in ED - no clear evidence of volume overload on exam  - pt is stable and in no distress this AM - he has been on oxygen this AM, we can try to taper off   DVT prophylaxis: SCD's Code Status: DNR Family Communication: Patient at bedside  Disposition Plan: Home in AM if blood counts stable   Consultants:   None  Procedures:   None  Antimicrobials:   None   Subjective: No events overnight.   Objective: Vitals:   10/27/15 2050 10/28/15 0444 10/28/15 0712 10/28/15 0817  BP: 121/69 118/67    Pulse: 91 95    Resp: 20 20    Temp: 98.8 F (37.1 C) 98.5 F (36.9 C)    TempSrc: Oral Oral    SpO2: 100% 99% 93% 98%  Weight:  102.2 kg (225 lb 4.8 oz)    Height:        Intake/Output Summary (Last 24 hours) at 10/28/15 1317 Last data filed at 10/28/15 1012  Gross per 24 hour  Intake             1561 ml  Output              650 ml  Net              911 ml   Filed Weights   10/27/15 0107 10/27/15 0638 10/28/15 0444  Weight: 102.5 kg (226 lb) 100.6 kg (221 lb 12.5 oz) 102.2 kg (225 lb 4.8 oz)    Examination:  General exam: Appears calm and comfortable  Respiratory system: Respiratory effort normal. Cardiovascular system: S1 & S2 heard, RRR. No  JVD, rubs, gallops or clicks. No pedal edema. Gastrointestinal system: Abdomen is nondistended, soft and nontender. No organomegaly or masses felt.  Central nervous system: Alert and oriented. No focal neurological deficits. Extremities: Symmetric 5 x 5 power.  Data Reviewed: I have personally reviewed following labs and imaging studies  CBC:  Recent Labs Lab 10/23/15 0942 10/27/15 0124 10/28/15 0350  WBC 1.1* 0.8* 1.1*  NEUTROABS 0.4* 0.6*  --   HGB 9.8* 8.0* 8.1*  HCT 29.3* 23.0* 24.0*  MCV 95.8 95.0 94.9  PLT 60* 44* 47*   Basic Metabolic Panel:  Recent Labs Lab 10/27/15 0130 10/28/15 0350  NA 133* 135  K 3.3* 4.4  CL 100* 98*  CO2 24 30  GLUCOSE 230* 179*  BUN 15 19  CREATININE 0.76 0.74  CALCIUM 9.2 9.3   Liver Function Tests:  Recent Labs Lab 10/27/15 0130 10/28/15 0350  AST 29 21  ALT 36 28  ALKPHOS 48 43  BILITOT 1.2 0.8  PROT 6.7 6.7  ALBUMIN 3.6 3.3*   Cardiac Enzymes:  Recent Labs Lab 10/27/15 0130  TROPONINI <0.03   BNP (last 3 results)  Recent Labs  05/13/15 1043 07/08/15 1520 09/03/15 1445  PROBNP 271.0* 447.0* 535.0*     Recent Labs Lab 10/27/15 1205 10/27/15 1719 10/27/15 2218 10/28/15 0759 10/28/15 1145  GLUCAP 205* 160* 223* 195* 198*   Urine analysis:    Component Value Date/Time   COLORURINE AMBER (A) 10/27/2015 0252   APPEARANCEUR CLOUDY (A) 10/27/2015 0252   LABSPEC 1.026 10/27/2015 0252   PHURINE 6.0 10/27/2015 0252   GLUCOSEU NEGATIVE 10/27/2015 0252   GLUCOSEU NEGATIVE 09/03/2015 1445   HGBUR NEGATIVE 10/27/2015 0252   BILIRUBINUR NEGATIVE 10/27/2015 0252   KETONESUR 40 (A) 10/27/2015 0252   PROTEINUR NEGATIVE 10/27/2015 0252   UROBILINOGEN 0.2 09/03/2015 1445   NITRITE NEGATIVE 10/27/2015 0252   LEUKOCYTESUR NEGATIVE 10/27/2015 0252   Radiology Studies: Dg Chest 2 View  Result Date: 10/27/2015 CLINICAL DATA:  Cough, shortness of breath, weakness, and decreased urine output since yesterday. EXAM:  CHEST  2 VIEW COMPARISON:  10/08/2015 FINDINGS: Cardiac pacemaker. Postoperative changes in the mediastinum. Shallow inspiration with atelectasis in the lung bases. Cardiac enlargement with diffuse pulmonary vascular congestion. Slight interstitial pattern to the lung bases suggesting early edema. Vascular congestion is increasing since previous study. No blunting of costophrenic angles. No pneumothorax. Emphysematous changes in the lungs. Calcified and tortuous aorta IMPRESSION: Cardiac enlargement with increasing pulmonary vascular congestion and early interstitial edema developing since prior study. Electronically Signed   By: Lucienne Capers M.D.   On: 10/27/2015 02:03   Ct Angio Chest Pe W Or Wo Contrast  Result Date: 10/27/2015 CLINICAL DATA:  Shortness of breath. Episode of weakness following a large bowel movement last night. EXAM: CT ANGIOGRAPHY CHEST WITH CONTRAST TECHNIQUE: Multidetector CT imaging of the chest was performed using the standard protocol during bolus administration of intravenous contrast. Multiplanar CT image reconstructions and MIPs were obtained to evaluate the vascular anatomy. CONTRAST:  100 mL Isovue 370 COMPARISON:  09/10/2015 FINDINGS: Cardiovascular: Technically adequate study with good opacification of the central and segmental pulmonary arteries. No focal filling defects demonstrated. No evidence of significant pulmonary embolus. Cardiac enlargement. Reflux of contrast material into the hepatic veins suggests right heart failure. Coronary artery bypass. Calcification of the aorta without aneurysm. Mediastinum/Nodes: Postoperative changes in the mediastinum. Scattered mediastinal lymph nodes are not pathologically enlarged. Esophagus is decompressed. Moderate-sized esophageal hiatal hernia. Lungs/Pleura: Minimal left pleural effusion. Emphysematous changes in the lungs. Diffuse interstitial fibrosis. Patchy airspace infiltrates in the left mid and lower lung zones  demonstrating improvement since previous study. No pneumothorax. Airways are patent. Upper Abdomen: No acute abnormality. Musculoskeletal: Postoperative median sternotomy. Degenerative changes in the spine. No destructive bone lesions. Review of the MIP images confirms the above findings. IMPRESSION: No evidence of significant pulmonary embolus. Cardiac enlargement with signs of right heart failure. Emphysematous changes and fibrosis in the lungs with improving airspace infiltrates since previous study. Minimal left pleural effusion. Electronically Signed   By: Lucienne Capers M.D.   On: 10/27/2015 04:28   Scheduled Meds: . famotidine  20 mg Oral QHS  . feeding supplement (ENSURE ENLIVE)  237 mL Oral BID BM  . finasteride  5 mg Oral q morning - 10a  . fluticasone  2 spray Each Nare BID  . furosemide  40 mg Oral Daily  . insulin aspart  0-9 Units Subcutaneous TID WC  . levothyroxine  25 mcg Oral QAC breakfast  .  mometasone-formoterol  2 puff Inhalation Q12H  . multivitamin with minerals  1 tablet Oral Daily  . oxybutynin  10 mg Oral Daily  . pantoprazole  40 mg Oral Daily  . potassium chloride SA  40 mEq Oral BID  . sodium chloride flush  3 mL Intravenous Q12H  . sodium chloride flush  3 mL Intravenous Q12H  . traZODone  150 mg Oral QHS   Continuous Infusions:    LOS: 0 days  Time spent: 20 minutes   Faye Ramsay, MD [facility] Pager [phone]  If 7PM-7AM, please contact night-coverage [email or web address] Password [email or web address] 10/28/2015, 1:17 PM

## 2015-10-29 ENCOUNTER — Observation Stay (HOSPITAL_COMMUNITY): Payer: Medicare Other

## 2015-10-29 DIAGNOSIS — Y95 Nosocomial condition: Secondary | ICD-10-CM | POA: Diagnosis present

## 2015-10-29 DIAGNOSIS — J9611 Chronic respiratory failure with hypoxia: Secondary | ICD-10-CM | POA: Diagnosis not present

## 2015-10-29 DIAGNOSIS — E039 Hypothyroidism, unspecified: Secondary | ICD-10-CM | POA: Diagnosis present

## 2015-10-29 DIAGNOSIS — I480 Paroxysmal atrial fibrillation: Secondary | ICD-10-CM | POA: Diagnosis present

## 2015-10-29 DIAGNOSIS — Z23 Encounter for immunization: Secondary | ICD-10-CM | POA: Diagnosis not present

## 2015-10-29 DIAGNOSIS — Z950 Presence of cardiac pacemaker: Secondary | ICD-10-CM | POA: Diagnosis not present

## 2015-10-29 DIAGNOSIS — R5081 Fever presenting with conditions classified elsewhere: Secondary | ICD-10-CM | POA: Diagnosis present

## 2015-10-29 DIAGNOSIS — D469 Myelodysplastic syndrome, unspecified: Secondary | ICD-10-CM | POA: Diagnosis present

## 2015-10-29 DIAGNOSIS — E11649 Type 2 diabetes mellitus with hypoglycemia without coma: Secondary | ICD-10-CM | POA: Diagnosis present

## 2015-10-29 DIAGNOSIS — I509 Heart failure, unspecified: Secondary | ICD-10-CM

## 2015-10-29 DIAGNOSIS — J470 Bronchiectasis with acute lower respiratory infection: Secondary | ICD-10-CM | POA: Diagnosis present

## 2015-10-29 DIAGNOSIS — D638 Anemia in other chronic diseases classified elsewhere: Secondary | ICD-10-CM | POA: Diagnosis present

## 2015-10-29 DIAGNOSIS — R531 Weakness: Secondary | ICD-10-CM | POA: Diagnosis not present

## 2015-10-29 DIAGNOSIS — I5031 Acute diastolic (congestive) heart failure: Secondary | ICD-10-CM | POA: Diagnosis not present

## 2015-10-29 DIAGNOSIS — J189 Pneumonia, unspecified organism: Secondary | ICD-10-CM | POA: Diagnosis present

## 2015-10-29 DIAGNOSIS — Z79899 Other long term (current) drug therapy: Secondary | ICD-10-CM | POA: Diagnosis not present

## 2015-10-29 DIAGNOSIS — Z833 Family history of diabetes mellitus: Secondary | ICD-10-CM | POA: Diagnosis not present

## 2015-10-29 DIAGNOSIS — Z7984 Long term (current) use of oral hypoglycemic drugs: Secondary | ICD-10-CM | POA: Diagnosis not present

## 2015-10-29 DIAGNOSIS — D61818 Other pancytopenia: Secondary | ICD-10-CM | POA: Diagnosis present

## 2015-10-29 DIAGNOSIS — G9341 Metabolic encephalopathy: Secondary | ICD-10-CM | POA: Diagnosis present

## 2015-10-29 DIAGNOSIS — Z9981 Dependence on supplemental oxygen: Secondary | ICD-10-CM | POA: Diagnosis not present

## 2015-10-29 DIAGNOSIS — A419 Sepsis, unspecified organism: Secondary | ICD-10-CM | POA: Diagnosis present

## 2015-10-29 DIAGNOSIS — Z66 Do not resuscitate: Secondary | ICD-10-CM | POA: Diagnosis present

## 2015-10-29 DIAGNOSIS — D709 Neutropenia, unspecified: Secondary | ICD-10-CM | POA: Diagnosis not present

## 2015-10-29 DIAGNOSIS — Z87891 Personal history of nicotine dependence: Secondary | ICD-10-CM | POA: Diagnosis not present

## 2015-10-29 DIAGNOSIS — I5033 Acute on chronic diastolic (congestive) heart failure: Secondary | ICD-10-CM | POA: Diagnosis present

## 2015-10-29 DIAGNOSIS — J9621 Acute and chronic respiratory failure with hypoxia: Secondary | ICD-10-CM | POA: Diagnosis present

## 2015-10-29 LAB — CBC
HCT: 21.5 % — ABNORMAL LOW (ref 39.0–52.0)
Hemoglobin: 7.4 g/dL — ABNORMAL LOW (ref 13.0–17.0)
MCH: 32.5 pg (ref 26.0–34.0)
MCHC: 34.4 g/dL (ref 30.0–36.0)
MCV: 94.3 fL (ref 78.0–100.0)
PLATELETS: 45 10*3/uL — AB (ref 150–400)
RBC: 2.28 MIL/uL — ABNORMAL LOW (ref 4.22–5.81)
RDW: 20.3 % — AB (ref 11.5–15.5)
WBC: 0.7 10*3/uL — CL (ref 4.0–10.5)

## 2015-10-29 LAB — GLUCOSE, CAPILLARY
GLUCOSE-CAPILLARY: 200 mg/dL — AB (ref 65–99)
GLUCOSE-CAPILLARY: 213 mg/dL — AB (ref 65–99)
GLUCOSE-CAPILLARY: 244 mg/dL — AB (ref 65–99)

## 2015-10-29 LAB — BASIC METABOLIC PANEL
Anion gap: 6 (ref 5–15)
BUN: 17 mg/dL (ref 6–20)
CALCIUM: 8.9 mg/dL (ref 8.9–10.3)
CO2: 27 mmol/L (ref 22–32)
Chloride: 97 mmol/L — ABNORMAL LOW (ref 101–111)
Creatinine, Ser: 0.73 mg/dL (ref 0.61–1.24)
GFR calc Af Amer: >60 mL/min (ref >60)
GFR calc non Af Amer: >60 mL/min (ref >60)
GLUCOSE: 189 mg/dL — AB (ref 65–99)
Potassium: 4.4 mmol/L (ref 3.5–5.1)
Sodium: 130 mmol/L — ABNORMAL LOW (ref 135–145)

## 2015-10-29 LAB — BLOOD GAS, ARTERIAL
Acid-Base Excess: 3.9 mmol/L — ABNORMAL HIGH (ref 0.0–2.0)
Bicarbonate: 26.3 mmol/L (ref 20.0–28.0)
DRAWN BY: 295031
O2 Content: 3 L/min
O2 SAT: 98.2 %
PCO2 ART: 33 mmHg (ref 32.0–48.0)
PH ART: 7.512 — AB (ref 7.350–7.450)
Patient temperature: 98.6
pO2, Arterial: 103 mmHg (ref 83.0–108.0)

## 2015-10-29 LAB — PREPARE RBC (CROSSMATCH)

## 2015-10-29 LAB — EXPECTORATED SPUTUM ASSESSMENT W GRAM STAIN, RFLX TO RESP C

## 2015-10-29 LAB — EXPECTORATED SPUTUM ASSESSMENT W REFEX TO RESP CULTURE

## 2015-10-29 LAB — URINALYSIS, ROUTINE W REFLEX MICROSCOPIC
Bilirubin Urine: NEGATIVE
GLUCOSE, UA: NEGATIVE mg/dL
Hgb urine dipstick: NEGATIVE
KETONES UR: NEGATIVE mg/dL
LEUKOCYTES UA: NEGATIVE
NITRITE: NEGATIVE
PROTEIN: NEGATIVE mg/dL
Specific Gravity, Urine: 1.014 (ref 1.005–1.030)
pH: 6.5 (ref 5.0–8.0)

## 2015-10-29 LAB — LACTIC ACID, PLASMA
LACTIC ACID, VENOUS: 2.5 mmol/L — AB (ref 0.5–1.9)
LACTIC ACID, VENOUS: 2.3 mmol/L — AB (ref 0.5–1.9)

## 2015-10-29 LAB — APTT: aPTT: 34 seconds (ref 24–36)

## 2015-10-29 LAB — PROCALCITONIN: Procalcitonin: 0.49 ng/mL

## 2015-10-29 LAB — PROTIME-INR
INR: 1.35
Prothrombin Time: 16.8 seconds — ABNORMAL HIGH (ref 11.4–15.2)

## 2015-10-29 MED ORDER — VANCOMYCIN HCL IN DEXTROSE 750-5 MG/150ML-% IV SOLN
750.0000 mg | Freq: Two times a day (BID) | INTRAVENOUS | Status: DC
  Administered 2015-10-30 – 2015-11-01 (×5): 750 mg via INTRAVENOUS
  Filled 2015-10-29 (×5): qty 150

## 2015-10-29 MED ORDER — LORAZEPAM 2 MG/ML IJ SOLN
1.0000 mg | Freq: Once | INTRAMUSCULAR | Status: AC
  Administered 2015-10-29: 1 mg via INTRAVENOUS
  Filled 2015-10-29: qty 1

## 2015-10-29 MED ORDER — SODIUM CHLORIDE 0.9 % IV SOLN
Freq: Once | INTRAVENOUS | Status: AC
  Administered 2015-10-29: 12:00:00 via INTRAVENOUS

## 2015-10-29 MED ORDER — VANCOMYCIN HCL 10 G IV SOLR
2000.0000 mg | INTRAVENOUS | Status: AC
  Administered 2015-10-29: 2000 mg via INTRAVENOUS
  Filled 2015-10-29: qty 2000

## 2015-10-29 MED ORDER — LORAZEPAM 1 MG PO TABS: 1.0000 mg | ORAL_TABLET | Freq: Once | ORAL | Status: DC | PRN

## 2015-10-29 MED ORDER — FUROSEMIDE 10 MG/ML IJ SOLN
40.0000 mg | Freq: Once | INTRAMUSCULAR | Status: AC
  Administered 2015-10-29: 40 mg via INTRAVENOUS
  Filled 2015-10-29: qty 4

## 2015-10-29 MED ORDER — SODIUM CHLORIDE 0.9% FLUSH
10.0000 mL | Freq: Two times a day (BID) | INTRAVENOUS | Status: DC
  Administered 2015-11-01: 10 mL

## 2015-10-29 MED ORDER — FUROSEMIDE 10 MG/ML IJ SOLN
40.0000 mg | Freq: Two times a day (BID) | INTRAMUSCULAR | Status: DC
  Administered 2015-10-29 – 2015-11-02 (×8): 40 mg via INTRAVENOUS
  Filled 2015-10-29 (×8): qty 4

## 2015-10-29 MED ORDER — PIPERACILLIN-TAZOBACTAM 3.375 G IVPB
3.3750 g | Freq: Three times a day (TID) | INTRAVENOUS | Status: DC
  Administered 2015-10-29 – 2015-11-01 (×8): 3.375 g via INTRAVENOUS
  Filled 2015-10-29 (×8): qty 50

## 2015-10-29 MED ORDER — IPRATROPIUM-ALBUTEROL 0.5-2.5 (3) MG/3ML IN SOLN
3.0000 mL | Freq: Four times a day (QID) | RESPIRATORY_TRACT | Status: DC
  Administered 2015-10-29 – 2015-10-30 (×5): 3 mL via RESPIRATORY_TRACT
  Filled 2015-10-29 (×4): qty 3

## 2015-10-29 MED ORDER — FUROSEMIDE 10 MG/ML IJ SOLN: 20.0000 mg | Freq: Once | INTRAMUSCULAR | Status: DC

## 2015-10-29 MED ORDER — SODIUM CHLORIDE 0.9% FLUSH
10.0000 mL | INTRAVENOUS | Status: DC | PRN
  Administered 2015-10-31 – 2015-11-02 (×4): 10 mL
  Filled 2015-10-29 (×4): qty 40

## 2015-10-29 NOTE — Progress Notes (Signed)
Initial Nutrition Assessment  DOCUMENTATION CODES:   Not applicable  INTERVENTION:  - Continue Ensure Enlive BID, each supplement provides 350 kcal and 20 grams of protein - Continue to encourage PO intakes of meals and supplements. - RD will continue to monitor for additional nutrition-related needs.  NUTRITION DIAGNOSIS:   Unintentional weight loss related to other (see comment) (unknown cause at this time) as evidenced by percent weight loss.  GOAL:   Patient will meet greater than or equal to 90% of their needs  MONITOR:   PO intake, Supplement acceptance, Weight trends, Labs, I & O's  REASON FOR ASSESSMENT:   Malnutrition Screening Tool  ASSESSMENT:   82 y.o. male who presents to the Emergency Department complaining of weakness since yesterday morning.  Pt adds that he's had a cough for a little while and has had decreased urine output since yesterday.  Pt states he was on the commode and had a large bowel movement; when he went to stand up he felt weak and had to sit back down.  He denies fall and head injury as well as vomiting, diarrhea, blood in his stool and abnormal BLE swelling.  Pt uses O2 at night and occasionally during the day.    Pt seen for MST. BMI indicates overweight/borderline obesity. No family/visitors present at this time. Per chart review, pt consumed 80% of breakfast and 85% of lunch yesterday. Pt reports that for breakfast this AM he had scrambled eggs, toast, and coffee. During visit pt was in and out of sleep and needed to be aroused and reminded of topic several times. Spoke with RN who was in the room and reports that pt drank a full Ensure today.   Unable to complete physical assessment at this time with respect to pt's comfort but unable to visualize any muscle or fat wasting to upper body. Per chart review, pt lost 20 lbs (8% body weight) from 09/03/15-09/13/15. Since that time, weight has been mainly stable. Pt on Lasix at this time and RN states she  does not think this is a new medication for pt but is not certain. Will continue to monitor weight trends during hospitalization; pt with hx of CHF.  Medications reviewed; PRN Colace, 20 mg oral Pepcid/day, 40 mg IV Lasix x1 dose today, sliding scale Novolog, 25 mcg oral Synthroid/day, daily multivitamin with minerals, PRN IV Zofran, 40 mg oral Protonix/day, PRN Miralax, 40 mEq oral KCl BID.   Labs reviewed; CBGs: 200 and 213 mg/dL, Na: 130 mmol/L, Cl: 97 mmol/L.   Diet Order:  Diet heart healthy/carb modified Room service appropriate? Yes; Fluid consistency: Thin  Skin:  Reviewed, no issues  Last BM:  PTA  Height:   Ht Readings from Last 1 Encounters:  10/27/15 6\' 1"  (1.854 m)    Weight:   Wt Readings from Last 1 Encounters:  10/29/15 222 lb 14.2 oz (101.1 kg)    Ideal Body Weight:  83.64 kg  BMI:  Body mass index is 29.41 kg/m.  Estimated Nutritional Needs:   Kcal:  1500-1700  Protein:  65-75 grams  Fluid:  1.3-1.5 L/day  EDUCATION NEEDS:   No education needs identified at this time    Jarome Matin, MS, RD, LDN Inpatient Clinical Dietitian Pager # [phone] After hours/weekend pager # [phone]

## 2015-10-29 NOTE — Progress Notes (Signed)
Pharmacy Antibiotic Note  [name] is a 82 y.o. male admitted on 10/27/2015 with weakness, fatigue and difficulty standing up.  Pt has pancytopenia due to myelodysplastic syndrome. Pharmacy was consulted earlier to dose zosyn for sepsis.  Upon admission, pharmacy also asked to dose Vancomycin  Plan: -Continue Zosyn as ordered - Vancomycin 2gm IV x 1 then 750mg  IV q12h  Vancomycin trough goal: 15-20 mcg/ml  - Follow cultures, renal function, check vancomycin trough level when appropriate  Height: 6\' 1"  (185.4 cm) Weight: 222 lb 14.2 oz (101.1 kg) IBW/kg (Calculated) : 79.9  Temp (24hrs), Avg:99.5 F (37.5 C), Min:98.9 F (37.2 C), Max:100.7 F (38.2 C)   Recent Labs Lab 10/23/15 0942 10/27/15 0124 10/27/15 0130 10/28/15 0350 10/29/15 0523  WBC 1.1* 0.8*  --  1.1* 0.7*  CREATININE  --   --  0.76 0.74 0.73    Estimated Creatinine Clearance: 90.5 mL/min (by C-G formula based on SCr of 0.73 mg/dL).   10/3: CrCl (n) = 81 ml/min  No Known Allergies  Antimicrobials this admission: 10/3 zosyn >> 10/3 vanc >>   Microbiology results: 10/3 BCx: sent 10/3 Sputum Cx: sent   10/2 MRSA PCR: neg  Thank you for allowing pharmacy to be a part of this patient's care.  Leone Haven, PharmD 10/29/2015, 3:49 PM

## 2015-10-29 NOTE — Progress Notes (Addendum)
Patient ID: [name], male   DOB: 1933-01-05, 82 y.o.   MRN: [id number]    PROGRESS NOTE    [name]  [id number] DOB: 27-Mar-1932 DOA: 10/27/2015  PCP: Christinia Gully, MD   Brief Narrative:   82 y.o. male who presents to the Emergency Department complaining of weakness, fatigue, difficulty standing up.   Assessment & Plan:   Active Problems:   Severe pancytopenia due to MDS (myelodysplastic syndrome) (HCC) - blood counts down this AM - WBC is down, will repeat again in am , may need neupogen - transfuse one unit of PRBC today  - CBC In AM    Sepsis - pt meets criteria for sepsis - protocol initiated - not sure what the source is but could be HCAP as pt with multiple recent hospitalizations  - CXR requested - started on vanc and zosyn for now until we get more data back  - follow up on lactic acid and procalcitonin - avoid fluids as pt with some volume overload     Acute metabolic encephalopathy - secondary to the above - close monitor in case pt needs transfer to SDU     Weakness and fatigue - due to the above - PT eval requested     Acute on chronic respiratory failure due to acute diastolic CHF with right heart failure, ? HCAP - lasix 40 mg IV given in ED - more congestion and dyspnea on exam this AM, ABG pending, CXR pending  - placed on broad spectrum ABX for now  - follow up on CXR  - continue lasix 40 mg IV   DVT prophylaxis: SCD's Code Status: DNR Family Communication: Patient at bedside, daughter [name] over the phone  Disposition Plan: Home when more clinically stable   Consultants:   None  Procedures:   None  Antimicrobials:   Vanc and Zosyn 10/03 -->   Subjective: More dyspnea this AM, confusion.   Objective: Vitals:   10/29/15 1131 10/29/15 1205 10/29/15 1517 10/29/15 1550  BP: 110/67 120/68 (!) 129/57   Pulse: (!) 103 (!) 108 (!) 107   Resp: (!) 28 (!) 28 (!) 30   Temp: 98.9 F (37.2 C) 99.6 F (37.6 C) (!)  100.7 F (38.2 C)   TempSrc: Oral Oral Oral   SpO2: 100% 100% 100% 98%  Weight:      Height:        Intake/Output Summary (Last 24 hours) at 10/29/15 1714 Last data filed at 10/29/15 1514  Gross per 24 hour  Intake              741 ml  Output              825 ml  Net              -84 ml   Filed Weights   10/27/15 0638 10/28/15 0444 10/29/15 0344  Weight: 100.6 kg (221 lb 12.5 oz) 102.2 kg (225 lb 4.8 oz) 101.1 kg (222 lb 14.2 oz)    Examination:  General exam: Appears stable but confused, able to follow most of the commands.   Respiratory system: diminished breath sounds at bases with diffuse rhonchi Cardiovascular system: S1 & S2 heard, RRR. No JVD, rubs, gallops or clicks.  Gastrointestinal system: Abdomen is nondistended, soft and nontender. No organomegaly or masses felt.  Central nervous system: No focal neurological deficits other than confusion   Data Reviewed: I have personally reviewed following labs and imaging studies  CBC:  Recent Labs Lab 10/23/15 0942 10/27/15 0124 10/28/15 0350 10/29/15 0523  WBC 1.1* 0.8* 1.1* 0.7*  NEUTROABS 0.4* 0.6*  --   --   HGB 9.8* 8.0* 8.1* 7.4*  HCT 29.3* 23.0* 24.0* 21.5*  MCV 95.8 95.0 94.9 94.3  PLT 60* 44* 47* 45*   Basic Metabolic Panel:  Recent Labs Lab 10/27/15 0130 10/28/15 0350 10/29/15 0523  NA 133* 135 130*  K 3.3* 4.4 4.4  CL 100* 98* 97*  CO2 24 30 27   GLUCOSE 230* 179* 189*  BUN 15 19 17   CREATININE 0.76 0.74 0.73  CALCIUM 9.2 9.3 8.9   Liver Function Tests:  Recent Labs Lab 10/27/15 0130 10/28/15 0350  AST 29 21  ALT 36 28  ALKPHOS 48 43  BILITOT 1.2 0.8  PROT 6.7 6.7  ALBUMIN 3.6 3.3*   Cardiac Enzymes:  Recent Labs Lab 10/27/15 0130  TROPONINI <0.03   BNP (last 3 results)  Recent Labs  05/13/15 1043 07/08/15 1520 09/03/15 1445  PROBNP 271.0* 447.0* 535.0*     Recent Labs Lab 10/28/15 1714 10/28/15 2053 10/29/15 0741 10/29/15 1140 10/29/15 1704  GLUCAP 217* 225*  200* 213* 244*   Urine analysis:    Component Value Date/Time   COLORURINE YELLOW 10/29/2015 [place] 10/29/2015 1159   LABSPEC 1.014 10/29/2015 1159   PHURINE 6.5 10/29/2015 1159   GLUCOSEU NEGATIVE 10/29/2015 1159   GLUCOSEU NEGATIVE 09/03/2015 1445   HGBUR NEGATIVE 10/29/2015 1159   BILIRUBINUR NEGATIVE 10/29/2015 1159   KETONESUR NEGATIVE 10/29/2015 1159   PROTEINUR NEGATIVE 10/29/2015 1159   UROBILINOGEN 0.2 09/03/2015 1445   NITRITE NEGATIVE 10/29/2015 1159   LEUKOCYTESUR NEGATIVE 10/29/2015 1159   Radiology Studies: Dg Chest 2 View  Result Date: 10/29/2015 CLINICAL DATA:  Short of breath EXAM: CHEST  2 VIEW COMPARISON:  Chest x-ray 10 1 2017 FINDINGS: Sternotomy wires overlie nor enlarged mal cardiac silhouette. RIGHT power port noted. Mild interstitial edema pattern noted. No focal consolidation. No pneumothorax. IMPRESSION: Cardiomegaly and mild interstitial edema. Electronically Signed   By: Suzy Bouchard M.D.   On: 10/29/2015 11:17   Scheduled Meds: . famotidine  20 mg Oral QHS  . feeding supplement (ENSURE ENLIVE)  237 mL Oral BID BM  . finasteride  5 mg Oral q morning - 10a  . fluticasone  2 spray Each Nare BID  . furosemide  40 mg Intravenous BID  . insulin aspart  0-9 Units Subcutaneous TID WC  . ipratropium-albuterol  3 mL Nebulization QID  . levothyroxine  25 mcg Oral QAC breakfast  . mometasone-formoterol  2 puff Inhalation Q12H  . multivitamin with minerals  1 tablet Oral Daily  . oxybutynin  10 mg Oral Daily  . pantoprazole  40 mg Oral Daily  . piperacillin-tazobactam (ZOSYN)  IV  3.375 g Intravenous Q8H  . potassium chloride SA  40 mEq Oral BID  . sodium chloride flush  10-40 mL Intracatheter Q12H  . sodium chloride flush  3 mL Intravenous Q12H  . sodium chloride flush  3 mL Intravenous Q12H  . traZODone  150 mg Oral QHS  . vancomycin  2,000 mg Intravenous STAT  . [START ON 10/30/2015] vancomycin  750 mg Intravenous Q12H    Continuous Infusions:    LOS: 0 days  Time spent: 20 minutes   Faye Ramsay, MD [facility] Pager [phone]  If 7PM-7AM, please contact night-coverage [email or web address] Password [email or web address] 10/29/2015, 5:14 PM

## 2015-10-29 NOTE — Progress Notes (Signed)
CRITICAL VALUE ALERT  Critical value received:  Lactic Acid 2.5  Date of notification:  10/29/2015  Time of notification:  [id number]  Critical value read back:Yes.    Nurse who received alert:  Raynelle Jan, RN  MD notified (1st page):  Dr. Doyle Askew  Time of first page:  [phone]  MD notified (2nd page):  Time of second page:  Responding MD:  Dr. Doyle Askew  Time MD responded:  [phone]

## 2015-10-29 NOTE — Progress Notes (Signed)
Pharmacy Antibiotic Note  [name] is a 82 y.o. male admitted on 10/27/2015 with weakness, fatigue and difficulty standing up.  Pt has pancytopenia due to myelodysplastic syndrome. Pharmacy has been consulted to dose zosyn for sepsis.  Plan: -Zosyn 3.375g IV Q8H infused over 4hrs for CrCl >3mls/min -Pharmacy will sign off of formal note writing as renal adjustment unlikely but will follow peripherally.    Height: 6\' 1"  (185.4 cm) Weight: 222 lb 14.2 oz (101.1 kg) IBW/kg (Calculated) : 79.9  Temp (24hrs), Avg:99.2 F (37.3 C), Min:98.9 F (37.2 C), Max:99.6 F (37.6 C)   Recent Labs Lab 10/23/15 0942 10/27/15 0124 10/27/15 0130 10/28/15 0350 10/29/15 0523  WBC 1.1* 0.8*  --  1.1* 0.7*  CREATININE  --   --  0.76 0.74 0.73    Estimated Creatinine Clearance: 90.5 mL/min (by C-G formula based on SCr of 0.73 mg/dL).    No Known Allergies  Antimicrobials this admission: 10/3 zosyn >>   Microbiology results: 10/3 BCx: sent   10/2 MRSA PCR: neg  Thank you for allowing pharmacy to be a part of this patient's care.  Dolly Rias RPh 10/29/2015, 1:04 PM Pager [phone]

## 2015-10-29 NOTE — Progress Notes (Signed)
PT Cancellation Note  Patient Details Name: [name] MRN: [id number] DOB: 11/22/1932   Cancelled Treatment:    Reason Eval/Treat Not Completed: Pt getting blood. Will hold PT and check back another day.   Weston Anna, MPT Pager: [phone]

## 2015-10-29 NOTE — Care Management Note (Signed)
Case Management Note  Patient Details  Name: [name] MRN: [id number] Date of Birth: [birth year]  Subjective/Objective: Faxed w/confirmation to [facility] aware of d/c, & HHC orders. Already has home 02. No further CM needs.                   Action/Plan:d/c home w/HHC.   Expected Discharge Date:                  Expected Discharge Plan:  [place]  In-House Referral:     Discharge planning Services  CM Consult  Post Acute Care Choice:  Home Health, Durable Medical Equipment (Active w/[facility], has home 02 w/[facility] ) Choice offered to:  Patient  DME Arranged:    DME Agency:     HH Arranged:    [place]:  [place]  Status of Service:  Completed, signed off  If discussed at [place] of Stay Meetings, dates discussed:    Additional Comments:  Dessa Phi, RN 10/29/2015, 10:48 AM

## 2015-10-29 NOTE — Progress Notes (Signed)
CRITICAL VALUE ALERT  Critical value received: Lactic Acid 2.3  Date of notification:  10/29/15  Time of notification: 2139  Critical value read back: yes  Nurse who received alert:  Harlene Ramus  MD notified (1st page):  Baltazar Najjar  Time of first page:  2146  MD notified (2nd page):  Time of second page:  Responding MD:  Doyle Askew  Time MD responded:  2224

## 2015-10-30 DIAGNOSIS — A419 Sepsis, unspecified organism: Principal | ICD-10-CM

## 2015-10-30 DIAGNOSIS — R5081 Fever presenting with conditions classified elsewhere: Secondary | ICD-10-CM

## 2015-10-30 DIAGNOSIS — I5033 Acute on chronic diastolic (congestive) heart failure: Secondary | ICD-10-CM

## 2015-10-30 DIAGNOSIS — D709 Neutropenia, unspecified: Secondary | ICD-10-CM

## 2015-10-30 LAB — GLUCOSE, CAPILLARY
GLUCOSE-CAPILLARY: 214 mg/dL — AB (ref 65–99)
GLUCOSE-CAPILLARY: 212 mg/dL — AB (ref 65–99)
GLUCOSE-CAPILLARY: 196 mg/dL — AB (ref 65–99)
Glucose-Capillary: 183 mg/dL — ABNORMAL HIGH (ref 65–99)
Glucose-Capillary: 180 mg/dL — ABNORMAL HIGH (ref 65–99)

## 2015-10-30 LAB — BASIC METABOLIC PANEL
ANION GAP: 6 (ref 5–15)
BUN: 24 mg/dL — ABNORMAL HIGH (ref 6–20)
CO2: 30 mmol/L (ref 22–32)
Calcium: 8.8 mg/dL — ABNORMAL LOW (ref 8.9–10.3)
Chloride: 96 mmol/L — ABNORMAL LOW (ref 101–111)
Creatinine, Ser: 0.81 mg/dL (ref 0.61–1.24)
GFR calc Af Amer: >60 mL/min (ref >60)
GFR calc non Af Amer: >60 mL/min (ref >60)
GLUCOSE: 172 mg/dL — AB (ref 65–99)
POTASSIUM: 4.4 mmol/L (ref 3.5–5.1)
Sodium: 132 mmol/L — ABNORMAL LOW (ref 135–145)

## 2015-10-30 LAB — TYPE AND SCREEN
ABO/RH(D): A POS
ANTIBODY SCREEN: NEGATIVE
Unit division: 0
Unit division: 0

## 2015-10-30 LAB — CBC
HCT: 22.1 % — ABNORMAL LOW (ref 39.0–52.0)
HEMOGLOBIN: 7.6 g/dL — AB (ref 13.0–17.0)
MCH: 32.3 pg (ref 26.0–34.0)
MCHC: 34.4 g/dL (ref 30.0–36.0)
MCV: 94 fL (ref 78.0–100.0)
Platelets: 43 10*3/uL — ABNORMAL LOW (ref 150–400)
RBC: 2.35 MIL/uL — AB (ref 4.22–5.81)
RDW: 20.3 % — ABNORMAL HIGH (ref 11.5–15.5)
WBC: 0.5 10*3/uL — CL (ref 4.0–10.5)

## 2015-10-30 MED ORDER — TBO-FILGRASTIM 300 MCG/0.5ML ~~LOC~~ SOSY
300.0000 ug | PREFILLED_SYRINGE | Freq: Once | SUBCUTANEOUS | Status: AC
  Administered 2015-10-30: 300 ug via SUBCUTANEOUS
  Filled 2015-10-30: qty 0.5

## 2015-10-30 MED ORDER — IPRATROPIUM-ALBUTEROL 0.5-2.5 (3) MG/3ML IN SOLN
3.0000 mL | Freq: Three times a day (TID) | RESPIRATORY_TRACT | Status: DC
  Administered 2015-10-30 – 2015-11-01 (×5): 3 mL via RESPIRATORY_TRACT
  Filled 2015-10-30 (×5): qty 3

## 2015-10-30 NOTE — Progress Notes (Signed)
Patient ID: [name], male   DOB: Jul 17, 1932, 82 y.o.   MRN: [id number]    PROGRESS NOTE    [name]  [id number] DOB: January 28, 1932 DOA: 10/27/2015  PCP: Christinia Gully, MD   Brief Narrative:   82 y.o. male who presents to the Emergency Department complaining of weakness, fatigue, difficulty standing up.   Assessment & Plan:   Active Problems:   Severe pancytopenia due to MDS (myelodysplastic syndrome) (HCC) - WBC is down, will repeat again in am. Will give one dose of Neupogen  - s/p post 1 units of PRBC on 10/3 - CBC In AM to follow trend  -no signs of acute bleeding  -continue treatment for neutropenic fever (see below)    Neutropenic fever/Sepsis - pt meets criteria for sepsis (tachypneic, tachycardic, leukopenic and with elevated lactic acid)  - not sure what the source is yet - started on vanc and zosyn for now until we get more data back  - follow up on lactic acid and procalcitonin as per protocol  - avoid fluids as pt with some volume overload  - follow culture data     Acute metabolic encephalopathy - secondary to neutropenic fever  - continue current abx's -will follow culture data    Weakness and fatigue -multifactorial  - PT eval requested and recommending short term SNF for rehab; if significant improvement could potentially go home with [facility] services. Will follow clinical response     Acute on chronic respiratory failure due to acute diastolic CHF with right heart failure, ? HCAP - placed on broad spectrum ABX for now  - follow up CXR demonstrating interstitial edema - continue lasix 40 mg IV   DVT prophylaxis: SCD's Code Status: DNR Family Communication: Patient at bedside, no family available today (daughter updated on 10/3) Disposition Plan: to be determine; patient with physical deconditioning and might benefit of short term SNF for rehab at discharge.  Consultants:   Dr. [name] made aware of his admission through [facility]   Procedures:     None  Antimicrobials:   Vanc and Zosyn 10/03 -->   Subjective: Still spiking fever and slightly SOB. Patient is less confused today. Continue to have three line low cell count despite transfusion.   Objective: Vitals:   10/30/15 0500 10/30/15 0635 10/30/15 0804 10/30/15 1321  BP:    (!) 105/59  Pulse:    73  Resp:    (!) 22  Temp:  97.6 F (36.4 C)  97.7 F (36.5 C)  TempSrc:    Oral  SpO2:   98% 99%  Weight: 99.8 kg (220 lb 0.3 oz)     Height:        Intake/Output Summary (Last 24 hours) at 10/30/15 1756 Last data filed at 10/30/15 1300  Gross per 24 hour  Intake              800 ml  Output             1575 ml  Net             -775 ml   Filed Weights   10/28/15 0444 10/29/15 0344 10/30/15 0500  Weight: 102.2 kg (225 lb 4.8 oz) 101.1 kg (222 lb 14.2 oz) 99.8 kg (220 lb 0.3 oz)    Examination:  General exam: Appears in no distress; oriented X 2 this morning. Still spiking high grade temp. No CP, no abd pain, no nausea, no vomiting.    Respiratory system: diminished breath  sounds at bases, mild crackles and diffuse rhonchi Cardiovascular system: S1 & S2 heard, RRR. No JVD, rubs, gallops or clicks.  Gastrointestinal system: Abdomen is nondistended, soft and nontender. No organomegaly or masses felt.  Central nervous system: No focal neurological deficits other than confusion   Data Reviewed: I have personally reviewed following labs and imaging studies  CBC:  Recent Labs Lab 10/27/15 0124 10/28/15 0350 10/29/15 0523 10/30/15 0440  WBC 0.8* 1.1* 0.7* 0.5*  NEUTROABS 0.6*  --   --   --   HGB 8.0* 8.1* 7.4* 7.6*  HCT 23.0* 24.0* 21.5* 22.1*  MCV 95.0 94.9 94.3 94.0  PLT 44* 47* 45* 43*   Basic Metabolic Panel:  Recent Labs Lab 10/27/15 0130 10/28/15 0350 10/29/15 0523 10/30/15 0440  NA 133* 135 130* 132*  K 3.3* 4.4 4.4 4.4  CL 100* 98* 97* 96*  CO2 24 30 27 30   GLUCOSE 230* 179* 189* 172*  BUN 15 19 17  24*  CREATININE 0.76 0.74 0.73 0.81   CALCIUM 9.2 9.3 8.9 8.8*   Liver Function Tests:  Recent Labs Lab 10/27/15 0130 10/28/15 0350  AST 29 21  ALT 36 28  ALKPHOS 48 43  BILITOT 1.2 0.8  PROT 6.7 6.7  ALBUMIN 3.6 3.3*   Cardiac Enzymes:  Recent Labs Lab 10/27/15 0130  TROPONINI <0.03   BNP (last 3 results)  Recent Labs  05/13/15 1043 07/08/15 1520 09/03/15 1445  PROBNP 271.0* 447.0* 535.0*     Recent Labs Lab 10/29/15 1704 10/29/15 2152 10/30/15 0731 10/30/15 1145 10/30/15 1659  GLUCAP 244* 214* 183* 212* 180*   Urine analysis:    Component Value Date/Time   COLORURINE YELLOW 10/29/2015 [place] 10/29/2015 1159   LABSPEC 1.014 10/29/2015 1159   PHURINE 6.5 10/29/2015 1159   GLUCOSEU NEGATIVE 10/29/2015 1159   GLUCOSEU NEGATIVE 09/03/2015 1445   HGBUR NEGATIVE 10/29/2015 1159   BILIRUBINUR NEGATIVE 10/29/2015 1159   KETONESUR NEGATIVE 10/29/2015 1159   PROTEINUR NEGATIVE 10/29/2015 1159   UROBILINOGEN 0.2 09/03/2015 1445   NITRITE NEGATIVE 10/29/2015 1159   LEUKOCYTESUR NEGATIVE 10/29/2015 1159   Radiology Studies: Dg Chest 2 View  Result Date: 10/29/2015 CLINICAL DATA:  Short of breath EXAM: CHEST  2 VIEW COMPARISON:  Chest x-ray 10 1 2017 FINDINGS: Sternotomy wires overlie nor enlarged mal cardiac silhouette. RIGHT power port noted. Mild interstitial edema pattern noted. No focal consolidation. No pneumothorax. IMPRESSION: Cardiomegaly and mild interstitial edema. Electronically Signed   By: Suzy Bouchard M.D.   On: 10/29/2015 11:17   Scheduled Meds: . famotidine  20 mg Oral QHS  . feeding supplement (ENSURE ENLIVE)  237 mL Oral BID BM  . finasteride  5 mg Oral q morning - 10a  . fluticasone  2 spray Each Nare BID  . furosemide  40 mg Intravenous BID  . insulin aspart  0-9 Units Subcutaneous TID WC  . ipratropium-albuterol  3 mL Nebulization TID  . levothyroxine  25 mcg Oral QAC breakfast  . mometasone-formoterol  2 puff Inhalation Q12H  . multivitamin with  minerals  1 tablet Oral Daily  . oxybutynin  10 mg Oral Daily  . pantoprazole  40 mg Oral Daily  . piperacillin-tazobactam (ZOSYN)  IV  3.375 g Intravenous Q8H  . potassium chloride SA  40 mEq Oral BID  . sodium chloride flush  10-40 mL Intracatheter Q12H  . sodium chloride flush  3 mL Intravenous Q12H  . sodium chloride flush  3 mL Intravenous  Q12H  . Tbo-Filgrastim  300 mcg Subcutaneous Once  . traZODone  150 mg Oral QHS  . vancomycin  750 mg Intravenous Q12H   Continuous Infusions:    LOS: 1 day  Time spent: 25 minutes   Barton Dubois, MD [facility] Pager [phone]  If 7PM-7AM, please contact night-coverage [email or web address] Password [email or web address] 10/30/2015, 5:56 PM

## 2015-10-30 NOTE — Progress Notes (Addendum)
Physical Therapy Treatment Patient Details Name: [name] MRN: [id number] DOB: 1932/07/22 Today's Date: 10/30/2015    History of Present Illness 82 yo male admitted with CHF, weakness, sepsis. Hx of MDS, DM, A fib, 2* Mobitz    PT Comments    Progressing slowly with  Mobility. Pt is weak and he fatigues quickly. May need to consider ST SNF if pt is agreeable.   Follow Up Recommendations  Home health PT;Supervision/Assistance - 24 hour vs SNF (may need to consider ST SNF)     Equipment Recommendations  None recommended by PT    Recommendations for Other Services       Precautions / Restrictions Precautions Precautions: Fall Restrictions Weight Bearing Restrictions: No    Mobility  Bed Mobility Overal bed mobility: Needs Assistance Bed Mobility: Supine to Sit     Supine to sit: Min guard;HOB elevated     General bed mobility comments: close guard for safety. Increased time. HOB 60 degrees.   Transfers Overall transfer level: Needs assistance Equipment used: Rolling walker (2 wheeled) Transfers: Sit to/from Stand Sit to Stand: Min assist         General transfer comment: small amount of assist to steady.   Ambulation/Gait Ambulation/Gait assistance: Min assist Ambulation Distance (Feet): 40 Feet Assistive device: Rolling walker (2 wheeled) Gait Pattern/deviations: Step-through pattern;Decreased stride length     General Gait Details: Assist to stabilize pt. He fatigues quickly. Standing rest break needed after 20 feet. Dyspena 3/4. Remained on 2L [place] O2-99%   Stairs            Wheelchair Mobility    Modified Rankin (Stroke Patients Only)       Balance Overall balance assessment: Needs assistance           Standing balance-Leahy Scale: Poor                      Cognition Arousal/Alertness: Awake/alert Behavior During Therapy: WFL for tasks assessed/performed Overall Cognitive Status: Within Functional Limits for tasks  assessed                      Exercises      General Comments        Pertinent Vitals/Pain Pain Assessment: No/denies pain    Home Living                      Prior Function            PT Goals (current goals can now be found in the care plan section) Progress towards PT goals: Progressing toward goals (slowly)    Frequency    Min 3X/week      PT Plan Current plan remains appropriate    Co-evaluation             End of Session Equipment Utilized During Treatment: Gait belt;Oxygen Activity Tolerance: Patient limited by fatigue Patient left: in chair;with call bell/phone within reach;with chair alarm set     Time: 1130-1145 PT Time Calculation (min) (ACUTE ONLY): 15 min  Charges:  $Gait Training: 8-22 mins                    G Codes:      Weston Anna, MPT Pager: [phone]

## 2015-10-31 LAB — BASIC METABOLIC PANEL
Anion gap: 8 (ref 5–15)
BUN: 24 mg/dL — AB (ref 6–20)
CALCIUM: 8.5 mg/dL — AB (ref 8.9–10.3)
CO2: 27 mmol/L (ref 22–32)
CREATININE: 0.86 mg/dL (ref 0.61–1.24)
Chloride: 95 mmol/L — ABNORMAL LOW (ref 101–111)
GLUCOSE: 173 mg/dL — AB (ref 65–99)
POTASSIUM: 4.1 mmol/L (ref 3.5–5.1)
SODIUM: 130 mmol/L — AB (ref 135–145)

## 2015-10-31 LAB — CBC WITH DIFFERENTIAL/PLATELET
BASOS ABS: 0 10*3/uL (ref 0.0–0.1)
Basophils Relative: 0 %
EOS ABS: 0 10*3/uL (ref 0.0–0.7)
Eosinophils Relative: 1 %
HCT: 21.5 % — ABNORMAL LOW (ref 39.0–52.0)
Hemoglobin: 7.5 g/dL — ABNORMAL LOW (ref 13.0–17.0)
LYMPHS PCT: 38 %
Lymphs Abs: 0.3 10*3/uL — ABNORMAL LOW (ref 0.7–4.0)
MCH: 32.5 pg (ref 26.0–34.0)
MCHC: 34.9 g/dL (ref 30.0–36.0)
MCV: 93.1 fL (ref 78.0–100.0)
MONOS PCT: 0 %
Monocytes Absolute: 0 10*3/uL — ABNORMAL LOW (ref 0.1–1.0)
NEUTROS ABS: 0.5 10*3/uL — AB (ref 1.7–7.7)
NEUTROS PCT: 61 %
PLATELETS: 43 10*3/uL — AB (ref 150–400)
RBC: 2.31 MIL/uL — AB (ref 4.22–5.81)
RDW: 20 % — ABNORMAL HIGH (ref 11.5–15.5)
WBC: 0.8 10*3/uL — AB (ref 4.0–10.5)

## 2015-10-31 LAB — GLUCOSE, CAPILLARY
GLUCOSE-CAPILLARY: 169 mg/dL — AB (ref 65–99)
GLUCOSE-CAPILLARY: 199 mg/dL — AB (ref 65–99)
Glucose-Capillary: 198 mg/dL — ABNORMAL HIGH (ref 65–99)
Glucose-Capillary: 271 mg/dL — ABNORMAL HIGH (ref 65–99)

## 2015-10-31 MED ORDER — GUAIFENESIN-DM 100-10 MG/5ML PO SYRP
5.0000 mL | ORAL_SOLUTION | ORAL | Status: DC | PRN
  Administered 2015-10-31: 5 mL via ORAL
  Filled 2015-10-31: qty 10

## 2015-10-31 NOTE — Consult Note (Signed)
   [facility] CM Inpatient Consult   10/31/2015  [name] August 14, 1932 [id number]   [name] has been active with [place] [facility] services. Spoke with [name] and daughter, [name], to discuss ongoing follow up. Patient's daughter states [name] was receiving too many calls and they do not think he needs home health and [facility]. States " it is just too much". [name] is agreeable to discontinuing [facility] Management services. States he would rather call [facility] if needed. Provided contact information to call if they change their mind. Also provided 24-hr nurse line number to contact. Will alert [facility] team as well as inpatient RNCM that patient no longer wants to be followed by [place] [facility] services.   Marthenia Rolling, MSN-Ed, RN,BSN [facility] Liaison [phone]

## 2015-10-31 NOTE — Progress Notes (Signed)
Patient ID: BENZ CHANCE, male   DOB: August 21, 1932, 82 y.o.   MRN: [id number]    PROGRESS NOTE    OMARIEN TORR  [id number] DOB: 01-30-1932 DOA: 10/27/2015  PCP: Christinia Gully, MD   Brief Narrative:   82 y.o. male who presents to the Emergency Department complaining of weakness, fatigue, difficulty standing up.   Assessment & Plan:   Active Problems:   Severe pancytopenia due to MDS (myelodysplastic syndrome) (HCC) - WBC is slightly better after neupogen (given on 10/4), s/p post 1 units of PRBC on 10/3 - CBC In AM to follow trend  -no signs of acute bleeding  -continue treatment for neutropenic fever (see below for details)    Neutropenic fever/Sepsis - pt meets criteria for sepsis (tachypneic, tachycardic, leukopenic and with elevated lactic acid)  - not sure what the source is yet - started on vanc and zosyn for now until we get more data back  - follow up on lactic acid and procalcitonin as per protocol  - avoid fluids as pt with some volume overload  - follow culture data     Acute metabolic encephalopathy - secondary to neutropenic fever  - continue current abx's -will follow culture data    Weakness and fatigue -multifactorial  - PT eval requested and recommending short term SNF for rehab; if significant improvement could potentially go home with [facility] services. Will follow clinical response     Acute on chronic respiratory failure due to acute diastolic CHF with right heart failure, ? HCAP - placed on broad spectrum ABX for now  -CXR demonstrating interstitial edema - continue lasix 40 mg IV   DVT prophylaxis: SCD's Code Status: DNR Family Communication: Patient at bedside, no family available today (daughter updated by phone on 10/5) Disposition Plan: to be determine; patient with physical deconditioning and might benefit of short term SNF for rehab at discharge.  Consultants:   Dr. [name] made aware of his admission through [facility]   Procedures:    None  Antimicrobials:   Vanc and Zosyn 10/03 -->   Subjective: Afebrile now for 24 hours. Patient is AAOX2 and able to follow commands properly, also engaging in conversation appropriately. Continue to have three line low cell count, with some improvement in his WBC's and essentially no negative or positive changes on his Hgb or platelets.   Objective: Vitals:   10/31/15 0511 10/31/15 0837 10/31/15 1501 10/31/15 1512  BP: 111/71  (!) 89/36 105/64  Pulse:   (!) 105   Resp: (!) 26  (!) 30 (!) 24  Temp: 98.4 F (36.9 C)  98.3 F (36.8 C)   TempSrc: Oral  Oral   SpO2: 100% 99% 98%   Weight:      Height:        Intake/Output Summary (Last 24 hours) at 10/31/15 1802 Last data filed at 10/31/15 1743  Gross per 24 hour  Intake              350 ml  Output             2950 ml  Net            -2600 ml   Filed Weights   10/29/15 0344 10/30/15 0500 10/31/15 0454  Weight: 101.1 kg (222 lb 14.2 oz) 99.8 kg (220 lb 0.3 oz) 100.8 kg (222 lb 3.6 oz)    Examination: General exam: Appears in no distress; oriented X 2 this morning and breathing easier. Patient sitting on the chair  and engaging into conversation properly. No fever in the last 24 hours. No CP, no abd pain, no nausea, no vomiting. Patient reports some constipation  Respiratory system: diminished breath sounds at bases, mild crackles still appreciated and scattered rhonchi Cardiovascular system: S1 & S2 heard, RRR. No JVD, rubs, gallops or clicks.  Gastrointestinal system: Abdomen is nondistended, soft and nontender. No organomegaly or masses felt.  Central nervous system: No focal neurological deficits; confusion essentially resolved. Moving four limbs spontaneously and able to follow commands properly.    Data Reviewed: I have personally reviewed following labs and imaging studies  CBC:  Recent Labs Lab 10/27/15 0124 10/28/15 0350 10/29/15 0523 10/30/15 0440 10/31/15 0345  WBC 0.8* 1.1* 0.7* 0.5* 0.8*  NEUTROABS  0.6*  --   --   --  0.5*  HGB 8.0* 8.1* 7.4* 7.6* 7.5*  HCT 23.0* 24.0* 21.5* 22.1* 21.5*  MCV 95.0 94.9 94.3 94.0 93.1  PLT 44* 47* 45* 43* 43*   Basic Metabolic Panel:  Recent Labs Lab 10/27/15 0130 10/28/15 0350 10/29/15 0523 10/30/15 0440 10/31/15 0345  NA 133* 135 130* 132* 130*  K 3.3* 4.4 4.4 4.4 4.1  CL 100* 98* 97* 96* 95*  CO2 24 30 27 30 27   GLUCOSE 230* 179* 189* 172* 173*  BUN 15 19 17  24* 24*  CREATININE 0.76 0.74 0.73 0.81 0.86  CALCIUM 9.2 9.3 8.9 8.8* 8.5*   Liver Function Tests:  Recent Labs Lab 10/27/15 0130 10/28/15 0350  AST 29 21  ALT 36 28  ALKPHOS 48 43  BILITOT 1.2 0.8  PROT 6.7 6.7  ALBUMIN 3.6 3.3*   Cardiac Enzymes:  Recent Labs Lab 10/27/15 0130  TROPONINI <0.03   BNP (last 3 results)  Recent Labs  05/13/15 1043 07/08/15 1520 09/03/15 1445  PROBNP 271.0* 447.0* 535.0*     Recent Labs Lab 10/30/15 1659 10/30/15 2111 10/31/15 0733 10/31/15 1218 10/31/15 1649  GLUCAP 180* 196* 169* 198* 271*   Urine analysis:    Component Value Date/Time   COLORURINE YELLOW 10/29/2015 [place] 10/29/2015 1159   LABSPEC 1.014 10/29/2015 1159   PHURINE 6.5 10/29/2015 1159   GLUCOSEU NEGATIVE 10/29/2015 1159   GLUCOSEU NEGATIVE 09/03/2015 1445   HGBUR NEGATIVE 10/29/2015 1159   BILIRUBINUR NEGATIVE 10/29/2015 1159   KETONESUR NEGATIVE 10/29/2015 1159   PROTEINUR NEGATIVE 10/29/2015 1159   UROBILINOGEN 0.2 09/03/2015 1445   NITRITE NEGATIVE 10/29/2015 1159   LEUKOCYTESUR NEGATIVE 10/29/2015 1159   Radiology Studies: No results found. Scheduled Meds: . famotidine  20 mg Oral QHS  . feeding supplement (ENSURE ENLIVE)  237 mL Oral BID BM  . finasteride  5 mg Oral q morning - 10a  . fluticasone  2 spray Each Nare BID  . furosemide  40 mg Intravenous BID  . insulin aspart  0-9 Units Subcutaneous TID WC  . ipratropium-albuterol  3 mL Nebulization TID  . levothyroxine  25 mcg Oral QAC breakfast  .  mometasone-formoterol  2 puff Inhalation Q12H  . multivitamin with minerals  1 tablet Oral Daily  . oxybutynin  10 mg Oral Daily  . pantoprazole  40 mg Oral Daily  . piperacillin-tazobactam (ZOSYN)  IV  3.375 g Intravenous Q8H  . potassium chloride SA  40 mEq Oral BID  . sodium chloride flush  10-40 mL Intracatheter Q12H  . sodium chloride flush  3 mL Intravenous Q12H  . sodium chloride flush  3 mL Intravenous Q12H  . traZODone  150 mg  Oral QHS  . vancomycin  750 mg Intravenous Q12H   Continuous Infusions:    LOS: 2 days  Time spent: 25 minutes   Barton Dubois, MD [facility] Pager [phone]  If 7PM-7AM, please contact night-coverage [email or web address] Password [email or web address] 10/31/2015, 6:02 PM

## 2015-10-31 NOTE — Progress Notes (Signed)
Inpatient Diabetes Program Recommendations  AACE/ADA: New Consensus Statement on Inpatient Glycemic Control (2015)  Target Ranges:  Prepandial:   less than 140 mg/dL      Peak postprandial:   less than 180 mg/dL (1-2 hours)      Critically ill patients:  140 - 180 mg/dL   Lab Results  Component Value Date   GLUCAP 198 (H) 10/31/2015   HGBA1C 6.9 (H) 07/01/2015    Review of Glycemic Control  On metformin 250 mg bid at home. Blood sugars > 180 mg/dL.  Inpatient Diabetes Program Recommendations: Increase Novolog to moderate tidwc and hs HgbA1C to assess glycemic control PTA. Last one 4 months ago.  Will follow.    Thank you. Lorenda Peck, RD, LDN, CDE Inpatient Diabetes Coordinator [phone]

## 2015-10-31 NOTE — Progress Notes (Signed)
Physical Therapy Treatment Patient Details Name: [name] MRN: [id number] DOB: [birth year] Today's Date: 10/31/2015    History of Present Illness 82 yo male admitted with CHF, weakness. Hx of MDS, DM, A fib, 2* Mobitz    PT Comments    Pt sitting on EOB on arrival with family in room.  Willing to try. Assisted with amb however a very limited distance.  MAX c/o weakness and fatigue.  Noted 3/4 DOE.  Only able to amb 12 feet then required an extended sitting rest break.  Remained on 2 lts.  Assisted back to room and positioned in supine to comfort.  Pt admits, he feels "really bad".    Follow Up Recommendations  Home health PT;Supervision/Assistance - 24 hour;SNF (pending medical progress)     Equipment Recommendations       Recommendations for Other Services       Precautions / Restrictions Precautions Precautions: Fall Restrictions Weight Bearing Restrictions: No    Mobility  Bed Mobility Overal bed mobility: Needs Assistance Bed Mobility: Supine to Sit;Sit to Supine     Supine to sit: Min assist Sit to supine: Mod assist   General bed mobility comments: extra assist back to bed due to fatigue  Transfers Overall transfer level: Needs assistance Equipment used: Rolling walker (2 wheeled)   Sit to Stand: Min assist;Mod assist         General transfer comment: extra assist to rise from lower level chair in hallway  Ambulation/Gait Ambulation/Gait assistance: Min assist Ambulation Distance (Feet): 24 Feet (12 feet x 2 with one sitting rest break)   Gait Pattern/deviations: Step-to pattern;Step-through pattern Gait velocity: decreased   General Gait Details: VERY limited activity tolerance and amb distance due to weakness/fatigue.  Remained on 2 lts nasal avg sats 93% but noted 3/4 dyspnea   Stairs            Wheelchair Mobility    Modified Rankin (Stroke Patients Only)       Balance                                     Cognition Arousal/Alertness: Awake/alert Behavior During Therapy: WFL for tasks assessed/performed Overall Cognitive Status: Within Functional Limits for tasks assessed                      Exercises      General Comments        Pertinent Vitals/Pain Pain Assessment: No/denies pain    Home Living                      Prior Function            PT Goals (current goals can now be found in the care plan section) Progress towards PT goals: Progressing toward goals    Frequency    Min 3X/week      PT Plan Current plan remains appropriate    Co-evaluation             End of Session Equipment Utilized During Treatment: Gait belt;Oxygen Activity Tolerance: Patient limited by fatigue;Other (comment) (HgB 7.5)       Time: [id number] PT Time Calculation (min) (ACUTE ONLY): 15 min  Charges:  $Gait Training: 8-22 mins                    G Codes:  Rica Koyanagi  PTA [facility]  Acute  Rehab Pager      [phone]

## 2015-11-01 LAB — CBC WITH DIFFERENTIAL/PLATELET
BASOS PCT: 0 %
Basophils Absolute: 0 10*3/uL (ref 0.0–0.1)
EOS PCT: 0 %
Eosinophils Absolute: 0 10*3/uL (ref 0.0–0.7)
HEMATOCRIT: 21.2 % — AB (ref 39.0–52.0)
Hemoglobin: 7.2 g/dL — ABNORMAL LOW (ref 13.0–17.0)
LYMPHS ABS: 0.6 10*3/uL — AB (ref 0.7–4.0)
Lymphocytes Relative: 46 %
MCH: 32.3 pg (ref 26.0–34.0)
MCHC: 34 g/dL (ref 30.0–36.0)
MCV: 95.1 fL (ref 78.0–100.0)
MONOS PCT: 2 %
Monocytes Absolute: 0 10*3/uL — ABNORMAL LOW (ref 0.1–1.0)
NEUTROS ABS: 0.6 10*3/uL — AB (ref 1.7–7.7)
Neutrophils Relative %: 52 %
Platelets: 44 10*3/uL — ABNORMAL LOW (ref 150–400)
RBC: 2.23 MIL/uL — ABNORMAL LOW (ref 4.22–5.81)
RDW: 20.3 % — AB (ref 11.5–15.5)
WBC: 1.2 10*3/uL — CL (ref 4.0–10.5)

## 2015-11-01 LAB — GLUCOSE, CAPILLARY
GLUCOSE-CAPILLARY: 277 mg/dL — AB (ref 65–99)
GLUCOSE-CAPILLARY: 166 mg/dL — AB (ref 65–99)
GLUCOSE-CAPILLARY: 264 mg/dL — AB (ref 65–99)
Glucose-Capillary: 235 mg/dL — ABNORMAL HIGH (ref 65–99)

## 2015-11-01 LAB — CULTURE, RESPIRATORY

## 2015-11-01 LAB — CULTURE, RESPIRATORY W GRAM STAIN

## 2015-11-01 LAB — PREPARE RBC (CROSSMATCH)

## 2015-11-01 MED ORDER — FUROSEMIDE 10 MG/ML IJ SOLN
20.0000 mg | Freq: Once | INTRAMUSCULAR | Status: AC
  Administered 2015-11-01: 20 mg via INTRAVENOUS
  Filled 2015-11-01: qty 2

## 2015-11-01 MED ORDER — ENSURE ENLIVE PO LIQD
237.0000 mL | Freq: Three times a day (TID) | ORAL | Status: DC
  Administered 2015-11-01 – 2015-11-02 (×2): 237 mL via ORAL

## 2015-11-01 MED ORDER — SODIUM CHLORIDE 0.9 % IV SOLN
Freq: Once | INTRAVENOUS | Status: AC
  Administered 2015-11-01: 17:00:00 via INTRAVENOUS

## 2015-11-01 MED ORDER — BENZONATATE 100 MG PO CAPS
100.0000 mg | ORAL_CAPSULE | Freq: Three times a day (TID) | ORAL | Status: DC | PRN
  Administered 2015-11-02: 100 mg via ORAL
  Filled 2015-11-01: qty 1

## 2015-11-01 MED ORDER — POLYETHYLENE GLYCOL 3350 17 G PO PACK
17.0000 g | PACK | Freq: Every day | ORAL | Status: DC
  Administered 2015-11-01 – 2015-11-02 (×2): 17 g via ORAL
  Filled 2015-11-01 (×2): qty 1

## 2015-11-01 MED ORDER — LEVOFLOXACIN IN D5W 750 MG/150ML IV SOLN
750.0000 mg | INTRAVENOUS | Status: DC
  Administered 2015-11-01 – 2015-11-02 (×2): 750 mg via INTRAVENOUS
  Filled 2015-11-01 (×2): qty 150

## 2015-11-01 MED ORDER — IPRATROPIUM-ALBUTEROL 0.5-2.5 (3) MG/3ML IN SOLN
3.0000 mL | Freq: Two times a day (BID) | RESPIRATORY_TRACT | Status: DC
  Administered 2015-11-02: 3 mL via RESPIRATORY_TRACT
  Filled 2015-11-01 (×2): qty 3

## 2015-11-01 MED ORDER — DOCUSATE SODIUM 100 MG PO CAPS
100.0000 mg | ORAL_CAPSULE | Freq: Two times a day (BID) | ORAL | Status: DC
  Administered 2015-11-01 – 2015-11-02 (×3): 100 mg via ORAL
  Filled 2015-11-01 (×3): qty 1

## 2015-11-01 NOTE — Progress Notes (Signed)
Physical Therapy Treatment Patient Details Name: [name] MRN: [id number] DOB: 09-23-1932 Today's Date: 11/01/2015    History of Present Illness 82 yo male admitted with CHF, weakness. Hx of MDS, DM, A fib, 2* Mobitz    PT Comments    Pt remains weak and he fatigues easily. He c/o dizziness this session and he was visibly unsteady.  He is at risk for falls. He requires at least Min assist for mobility. Daughter was present during session. When asked if she thinks she can manage with pt at home, she stated " I think so." At this time, I feel pt may need ST rehab at SNF however the decision remains up to pt and his family.    Follow Up Recommendations  Home health PT;Supervision/Assistance - 24 hour vs SNF (feel pt may need SNF. Daughter present-states plan is still for home at this time)     Equipment Recommendations  None recommended by PT    Recommendations for Other Services       Precautions / Restrictions Precautions Precautions: Fall Restrictions Weight Bearing Restrictions: No    Mobility  Bed Mobility Overal bed mobility: Needs Assistance Bed Mobility: Supine to Sit     Supine to sit: Min assist;HOB elevated     General bed mobility comments: small amount of assist to get to EOB. Increased time  Transfers Overall transfer level: Needs assistance Equipment used: Rolling walker (2 wheeled) Transfers: Sit to/from Stand Sit to Stand: Min assist;From elevated surface         General transfer comment: x2. VCs safety, technique, hand placement. Pt c/o dizziness 2nd time.   Ambulation/Gait Ambulation/Gait assistance: Min assist Ambulation Distance (Feet): 20 Feet (18'x1, 20'x1) Assistive device: Rolling walker (2 wheeled) Gait Pattern/deviations: Step-through pattern;Decreased stride length     General Gait Details: Pt continues to fatigue easily/quickly. Daughter followed with a recliner for rest breaks/safety. Unsteady on 2nd walk-pt c/o dizziness.  Remained on [place] O2.    Stairs            Wheelchair Mobility    Modified Rankin (Stroke Patients Only)       Balance                                    Cognition Arousal/Alertness: Awake/alert Behavior During Therapy: WFL for tasks assessed/performed Overall Cognitive Status: Within Functional Limits for tasks assessed                      Exercises General Exercises - Lower Extremity Ankle Circles/Pumps: AROM;Both;10 reps;Supine Quad Sets: AROM;Both;10 reps;Supine Hip ABduction/ADduction: AAROM;5 reps;Supine;AROM Straight Leg Raises: AROM;Both;10 reps;Supine    General Comments        Pertinent Vitals/Pain Pain Assessment: Faces Faces Pain Scale: Hurts little more Pain Location: back, R thigh/groing cramping with exercises Pain Descriptors / Indicators: Sore;Grimacing;Guarding Pain Intervention(s): Limited activity within patient's tolerance;Repositioned    Home Living                      Prior Function            PT Goals (current goals can now be found in the care plan section) Progress towards PT goals: Progressing toward goals (slowly)    Frequency    Min 3X/week      PT Plan Current plan remains appropriate    Co-evaluation  End of Session Equipment Utilized During Treatment: Gait belt;Oxygen Activity Tolerance: Patient limited by fatigue Patient left: in chair;with family/visitor present;with call bell/phone within reach     Time: 1041-1101 PT Time Calculation (min) (ACUTE ONLY): 20 min  Charges:  $Gait Training: 8-22 mins                    G Codes:      Weston Anna, MPT Pager: [phone]

## 2015-11-01 NOTE — NC FL2 (Signed)
[place] LEVEL OF CARE SCREENING TOOL     IDENTIFICATION  Patient Name: [name] Birthdate: 12-05-1932 Sex: male Admission Date (Current Location): 10/27/2015  [facility] and [place] Number:  Herbalist and Address:  [facility],  [place] [address] [place]      Provider Number: [id number]  Attending Physician Name and Address:  Barton Dubois, MD  Relative Name and Phone Number:       Current Level of Care: Hospital Recommended Level of Care: [place] Prior Approval Number:    Date Approved/Denied:   PASRR Number:    Discharge Plan:      Current Diagnoses: Patient Active Problem List   Diagnosis Date Noted  . CHF (congestive heart failure) ([place]) 10/29/2015  . Acute on chronic congestive heart failure ([place]) 10/27/2015  . Right heart failure 10/27/2015  . Generalized weakness 10/27/2015  . Symptomatic anemia 10/27/2015  . Acute CHF (congestive heart failure) ([place]) 10/27/2015  . Chronic respiratory failure with hypoxia ([place]) 10/08/2015  . HCAP (healthcare-associated pneumonia) 09/10/2015  . Bronchitis 07/01/2015  . Chest pain 06/30/2015  . Port catheter in place 05/22/2015  . Dyspnea 05/13/2015  . Bronchitis, chronic obstructive w acute bronchitis ([place]) 04/12/2015  . Cough variant asthma 03/05/2015  . Urinary frequency 02/12/2015  . Fungal infection 01/31/2015  . Sepsis due to pneumonia ([place]) 01/18/2015  . Lobar pneumonia, unspecified organism ([place]) 01/18/2015  . Neutropenia ([place]) 01/18/2015  . Thrombocytopenia ([place]) 01/18/2015  . Anemia of chronic disease 01/18/2015  . Pacemaker 01/18/2015  . Chronic diastolic CHF (congestive heart failure) ([place]) 01/18/2015  . MDS (myelodysplastic syndrome) ([place])   . Other pancytopenia ([place]) 12/06/2012  . Lactic acidosis 10/28/2012    Orientation RESPIRATION BLADDER Height & Weight     Self, Time, Situation, Place  O2 (2L) Continent Weight: (S) 222 lb 3.6  oz (100.8 kg) Height:  6\' 1"  (185.4 cm)  BEHAVIORAL SYMPTOMS/MOOD NEUROLOGICAL BOWEL NUTRITION STATUS      Continent Diet (Heart Healthy/Carb Modified)  AMBULATORY STATUS COMMUNICATION OF NEEDS Skin   Extensive Assist Verbally  (Wound / Incision (Open or Dehisced) 10/27/15 Laceration Right)                       Personal Care Assistance Level of Assistance  Bathing, Feeding, Dressing Bathing Assistance: Limited assistance Feeding assistance: Limited assistance Dressing Assistance: Limited assistance     Functional Limitations Info             [place]  PT (By licensed PT), OT (By licensed OT)     PT Frequency: 5 OT Frequency: 5            Contractures      Additional Factors Info  Code Status, Allergies Code Status Info: DNR Allergies Info: NKDA           Current Medications (11/01/2015):  This is the current hospital active medication list Current Facility-Administered Medications  Medication Dose Route Frequency Provider Last Rate Last Dose  . 0.9 %  sodium chloride infusion  250 mL Intravenous PRN Clanford Marisa Hua, MD      . acetaminophen (TYLENOL) tablet 650 mg  650 mg Oral Q6H PRN Clanford Marisa Hua, MD   650 mg at 10/30/15 0529   Or  . acetaminophen (TYLENOL) suppository 650 mg  650 mg Rectal Q6H PRN Clanford L Johnson, MD      . albuterol (PROVENTIL) (2.5 MG/3ML) 0.083% nebulizer  solution 2.5 mg  2.5 mg Nebulization Q4H PRN Clanford Marisa Hua, MD   2.5 mg at 10/29/15 1147  . ALPRAZolam Duanne Moron) tablet 0.25 mg  0.25 mg Oral QHS PRN Clanford Marisa Hua, MD   0.25 mg at 10/31/15 2115  . benzonatate (TESSALON) capsule 100 mg  100 mg Oral TID PRN Barton Dubois, MD      . dextromethorphan-guaiFENesin [employer] DM) 30-600 MG per 12 hr tablet 1-2 tablet  1-2 tablet Oral BID PRN Clanford Marisa Hua, MD   2 tablet at 10/31/15 1020  . docusate sodium (COLACE) capsule 100 mg  100 mg Oral BID Barton Dubois, MD   100 mg at 11/01/15 1013  .  famotidine (PEPCID) tablet 20 mg  20 mg Oral QHS Clanford Marisa Hua, MD   20 mg at 10/31/15 2115  . feeding supplement (ENSURE ENLIVE) (ENSURE ENLIVE) liquid 237 mL  237 mL Oral BID BM Dron Tanna Furry, MD   237 mL at 11/01/15 1030  . finasteride (PROSCAR) tablet 5 mg  5 mg Oral q morning - 10a Clanford Marisa Hua, MD   5 mg at 11/01/15 1012  . fluticasone (FLONASE) 50 MCG/ACT nasal spray 2 spray  2 spray Each Nare BID Clanford Marisa Hua, MD   2 spray at 11/01/15 1017  . furosemide (LASIX) injection 40 mg  40 mg Intravenous BID Theodis Blaze, MD   40 mg at 11/01/15 1014  . guaiFENesin-dextromethorphan (ROBITUSSIN DM) 100-10 MG/5ML syrup 5 mL  5 mL Oral Q4H PRN Barton Dubois, MD   5 mL at 10/31/15 1725  . insulin aspart (novoLOG) injection 0-9 Units  0-9 Units Subcutaneous TID WC Clanford Marisa Hua, MD   3 Units at 11/01/15 1107  . ipratropium-albuterol (DUONEB) 0.5-2.5 (3) MG/3ML nebulizer solution 3 mL  3 mL Nebulization BID Theodis Blaze, MD      . levofloxacin (LEVAQUIN) IVPB 750 mg  750 mg Intravenous Q24H Barton Dubois, MD   750 mg at 11/01/15 1059  . levothyroxine (SYNTHROID, LEVOTHROID) tablet 25 mcg  25 mcg Oral QAC breakfast Clanford Marisa Hua, MD   25 mcg at 11/01/15 1013  . LORazepam (ATIVAN) tablet 1 mg  1 mg Oral Once PRN Theodis Blaze, MD      . mometasone-formoterol [facility]) 200-5 MCG/ACT inhaler 2 puff  2 puff Inhalation Q12H Clanford Marisa Hua, MD   2 puff at 11/01/15 0803  . multivitamin with minerals tablet 1 tablet  1 tablet Oral Daily Clanford Marisa Hua, MD   1 tablet at 11/01/15 1013  . ondansetron (ZOFRAN) injection 4 mg  4 mg Intravenous Q6H PRN Clanford Marisa Hua, MD   4 mg at 10/31/15 0753  . oxybutynin (DITROPAN-XL) 24 hr tablet 10 mg  10 mg Oral Daily Clanford Marisa Hua, MD   10 mg at 11/01/15 1013  . pantoprazole (PROTONIX) EC tablet 40 mg  40 mg Oral Daily Clanford Marisa Hua, MD   40 mg at 11/01/15 1013  . polyethylene glycol (MIRALAX / GLYCOLAX) packet 17 g  17 g  Oral Daily Barton Dubois, MD   17 g at 11/01/15 1014  . potassium chloride SA (K-DUR,KLOR-CON) CR tablet 40 mEq  40 mEq Oral BID Clanford Marisa Hua, MD   40 mEq at 11/01/15 1013  . sodium chloride flush (NS) 0.9 % injection 10-40 mL  10-40 mL Intracatheter Q12H Theodis Blaze, MD      . sodium chloride flush (NS) 0.9 % injection 10-40 mL  10-40 mL  Intracatheter PRN Theodis Blaze, MD   10 mL at 10/31/15 1858  . sodium chloride flush (NS) 0.9 % injection 3 mL  3 mL Intravenous Q12H Clanford L Johnson, MD   3 mL at 11/01/15 1035  . sodium chloride flush (NS) 0.9 % injection 3 mL  3 mL Intravenous Q12H Clanford Marisa Hua, MD   3 mL at 10/31/15 2115  . sodium chloride flush (NS) 0.9 % injection 3 mL  3 mL Intravenous PRN Clanford Marisa Hua, MD      . traZODone (DESYREL) tablet 150 mg  150 mg Oral QHS Clanford Marisa Hua, MD   150 mg at 10/31/15 2115     Discharge Medications: Please see discharge summary for a list of discharge medications.  Relevant Imaging Results:  Relevant Lab Results:   Additional Information SSN: [id number]  Standley Brooking, LCSW

## 2015-11-01 NOTE — NC FL2 (Signed)
[place] LEVEL OF CARE SCREENING TOOL     IDENTIFICATION  Patient Name: [name] Birthdate: [birth year] Sex: male Admission Date (Current Location): 10/27/2015  [facility] and [place] Number:  Herbalist and Address:  [facility],  [place] [address] [place]      Provider Number: [id number]  Attending Physician Name and Address:  Barton Dubois, MD  Relative Name and Phone Number:       Current Level of Care: Hospital Recommended Level of Care: [place] Prior Approval Number:    Date Approved/Denied:   PASRR Number:   [id number]  Discharge Plan:      Current Diagnoses: Patient Active Problem List   Diagnosis Date Noted  . CHF (congestive heart failure) ([place]) 10/29/2015  . Acute on chronic congestive heart failure ([place]) 10/27/2015  . Right heart failure 10/27/2015  . Generalized weakness 10/27/2015  . Symptomatic anemia 10/27/2015  . Acute CHF (congestive heart failure) ([place]) 10/27/2015  . Chronic respiratory failure with hypoxia ([place]) 10/08/2015  . HCAP (healthcare-associated pneumonia) 09/10/2015  . Bronchitis 07/01/2015  . Chest pain 06/30/2015  . Port catheter in place 05/22/2015  . Dyspnea 05/13/2015  . Bronchitis, chronic obstructive w acute bronchitis ([place]) 04/12/2015  . Cough variant asthma 03/05/2015  . Urinary frequency 02/12/2015  . Fungal infection 01/31/2015  . Sepsis due to pneumonia ([place]) 01/18/2015  . Lobar pneumonia, unspecified organism ([place]) 01/18/2015  . Neutropenia ([place]) 01/18/2015  . Thrombocytopenia ([place]) 01/18/2015  . Anemia of chronic disease 01/18/2015  . Pacemaker 01/18/2015  . Chronic diastolic CHF (congestive heart failure) ([place]) 01/18/2015  . MDS (myelodysplastic syndrome) ([place])   . Other pancytopenia ([place]) 12/06/2012  . Lactic acidosis 10/28/2012    Orientation RESPIRATION BLADDER Height & Weight     Self, Time, Situation, Place  O2 (2L) Continent Weight: (S)  222 lb 3.6 oz (100.8 kg) Height:  6\' 1"  (185.4 cm)  BEHAVIORAL SYMPTOMS/MOOD NEUROLOGICAL BOWEL NUTRITION STATUS      Continent Diet (Heart Healthy/Carb Modified)  AMBULATORY STATUS COMMUNICATION OF NEEDS Skin   Extensive Assist Verbally  (Wound / Incision (Open or Dehisced) 10/27/15 Laceration Right)                       Personal Care Assistance Level of Assistance  Bathing, Feeding, Dressing Bathing Assistance: Limited assistance Feeding assistance: Limited assistance Dressing Assistance: Limited assistance     Functional Limitations Info             [place]  PT (By licensed PT), OT (By licensed OT)     PT Frequency: 5 OT Frequency: 5            Contractures      Additional Factors Info  Code Status, Allergies Code Status Info: DNR Allergies Info: NKDA           Current Medications (11/01/2015):  This is the current hospital active medication list Current Facility-Administered Medications  Medication Dose Route Frequency Provider Last Rate Last Dose  . 0.9 %  sodium chloride infusion  250 mL Intravenous PRN Clanford Marisa Hua, MD      . acetaminophen (TYLENOL) tablet 650 mg  650 mg Oral Q6H PRN Clanford Marisa Hua, MD   650 mg at 10/30/15 0529   Or  . acetaminophen (TYLENOL) suppository 650 mg  650 mg Rectal Q6H PRN Clanford L Johnson, MD      . albuterol (PROVENTIL) (2.5 MG/3ML) 0.083%  nebulizer solution 2.5 mg  2.5 mg Nebulization Q4H PRN Clanford Marisa Hua, MD   2.5 mg at 10/29/15 1147  . ALPRAZolam Duanne Moron) tablet 0.25 mg  0.25 mg Oral QHS PRN Clanford Marisa Hua, MD   0.25 mg at 10/31/15 2115  . benzonatate (TESSALON) capsule 100 mg  100 mg Oral TID PRN Barton Dubois, MD      . dextromethorphan-guaiFENesin [facility] DM) 30-600 MG per 12 hr tablet 1-2 tablet  1-2 tablet Oral BID PRN Clanford Marisa Hua, MD   2 tablet at 10/31/15 1020  . docusate sodium (COLACE) capsule 100 mg  100 mg Oral BID Barton Dubois, MD   100 mg at 11/01/15  1013  . famotidine (PEPCID) tablet 20 mg  20 mg Oral QHS Clanford Marisa Hua, MD   20 mg at 10/31/15 2115  . feeding supplement (ENSURE ENLIVE) (ENSURE ENLIVE) liquid 237 mL  237 mL Oral BID BM Dron Tanna Furry, MD   237 mL at 11/01/15 1030  . finasteride (PROSCAR) tablet 5 mg  5 mg Oral q morning - 10a Clanford Marisa Hua, MD   5 mg at 11/01/15 1012  . fluticasone (FLONASE) 50 MCG/ACT nasal spray 2 spray  2 spray Each Nare BID Clanford Marisa Hua, MD   2 spray at 11/01/15 1017  . furosemide (LASIX) injection 40 mg  40 mg Intravenous BID Theodis Blaze, MD   40 mg at 11/01/15 1014  . guaiFENesin-dextromethorphan (ROBITUSSIN DM) 100-10 MG/5ML syrup 5 mL  5 mL Oral Q4H PRN Barton Dubois, MD   5 mL at 10/31/15 1725  . insulin aspart (novoLOG) injection 0-9 Units  0-9 Units Subcutaneous TID WC Clanford Marisa Hua, MD   3 Units at 11/01/15 1107  . ipratropium-albuterol (DUONEB) 0.5-2.5 (3) MG/3ML nebulizer solution 3 mL  3 mL Nebulization BID Theodis Blaze, MD      . levofloxacin (LEVAQUIN) IVPB 750 mg  750 mg Intravenous Q24H Barton Dubois, MD   750 mg at 11/01/15 1059  . levothyroxine (SYNTHROID, LEVOTHROID) tablet 25 mcg  25 mcg Oral QAC breakfast Clanford Marisa Hua, MD   25 mcg at 11/01/15 1013  . LORazepam (ATIVAN) tablet 1 mg  1 mg Oral Once PRN Theodis Blaze, MD      . mometasone-formoterol [facility]) 200-5 MCG/ACT inhaler 2 puff  2 puff Inhalation Q12H Clanford Marisa Hua, MD   2 puff at 11/01/15 0803  . multivitamin with minerals tablet 1 tablet  1 tablet Oral Daily Clanford Marisa Hua, MD   1 tablet at 11/01/15 1013  . ondansetron (ZOFRAN) injection 4 mg  4 mg Intravenous Q6H PRN Clanford Marisa Hua, MD   4 mg at 10/31/15 0753  . oxybutynin (DITROPAN-XL) 24 hr tablet 10 mg  10 mg Oral Daily Clanford Marisa Hua, MD   10 mg at 11/01/15 1013  . pantoprazole (PROTONIX) EC tablet 40 mg  40 mg Oral Daily Clanford Marisa Hua, MD   40 mg at 11/01/15 1013  . polyethylene glycol (MIRALAX / GLYCOLAX) packet 17 g   17 g Oral Daily Barton Dubois, MD   17 g at 11/01/15 1014  . potassium chloride SA (K-DUR,KLOR-CON) CR tablet 40 mEq  40 mEq Oral BID Clanford Marisa Hua, MD   40 mEq at 11/01/15 1013  . sodium chloride flush (NS) 0.9 % injection 10-40 mL  10-40 mL Intracatheter Q12H Theodis Blaze, MD      . sodium chloride flush (NS) 0.9 % injection 10-40 mL  10-40  mL Intracatheter PRN Theodis Blaze, MD   10 mL at 10/31/15 1858  . sodium chloride flush (NS) 0.9 % injection 3 mL  3 mL Intravenous Q12H Clanford L Johnson, MD   3 mL at 11/01/15 1035  . sodium chloride flush (NS) 0.9 % injection 3 mL  3 mL Intravenous Q12H Clanford Marisa Hua, MD   3 mL at 10/31/15 2115  . sodium chloride flush (NS) 0.9 % injection 3 mL  3 mL Intravenous PRN Clanford Marisa Hua, MD      . traZODone (DESYREL) tablet 150 mg  150 mg Oral QHS Clanford Marisa Hua, MD   150 mg at 10/31/15 2115     Discharge Medications: Please see discharge summary for a list of discharge medications.  Relevant Imaging Results:  Relevant Lab Results:   Additional Information SSN: [id number]  Standley Brooking, LCSW

## 2015-11-01 NOTE — Clinical Social Work Note (Signed)
Clinical Social Work Assessment  Patient Details  Name: [name] MRN: [id number] Date of Birth: 06-04-1932  Date of referral:  11/01/15               Reason for consult:  Facility Placement                Permission sought to share information with:  Chartered certified accountant granted to share information::  Yes, Verbal Permission Granted  Name::        Agency::     Relationship::     Contact Information:     Housing/Transportation Living arrangements for the past 2 months:  Single Family Home Source of Information:  Patient, Adult Children Patient Interpreter Needed:  None Criminal Activity/Legal Involvement Pertinent to Current Situation/Hospitalization:  No - Comment as needed Significant Relationships:  Adult Children Lives with:  Self Do you feel safe going back to the place where you live?  No Need for family participation in patient care:  Yes (Comment)  Care giving concerns:  CSW received consult from [facility], Juliann Pulse that patient & daughter are now considering SNF placement.    Social Worker assessment / plan:  CSW spoke with patient at bedside & daughter, [name] via phone re: discharge planning. Patient states that he would feel comfortable going to SNF now.   Employment status:  Retired Forensic scientist:  Information systems manager PT Recommendations:  [place], Home with [place] / Referral to community resources:  [place]  Patient/Family's Response to care:  Patient states that he lives in [place] and would prefer to go to [facility] in [place] confirmed with Elyse Hsu at [facility] who states that they would be able to take patient when ready.   Patient/Family's Understanding of and Emotional Response to Diagnosis, Current Treatment, and Prognosis:    Emotional Assessment Appearance:  Appears stated age Attitude/Demeanor/Rapport:    Affect (typically observed):    Orientation:   Oriented to Self, Oriented to Place, Oriented to  Time, Oriented to Situation Alcohol / Substance use:    Psych involvement (Current and /or in the community):     Discharge Needs  Concerns to be addressed:    Readmission within the last 30 days:    Current discharge risk:    Barriers to Discharge:      Standley Brooking, LCSW 11/01/2015, 2:43 PM

## 2015-11-01 NOTE — Progress Notes (Signed)
Nutrition Follow-up  INTERVENTION:   Patient is at risk of refeeding syndrome related to low electrolyte labs and poor PO intake since admission (x5 days). Recommend daily labs of magnesium, phosphorus, and potassium. MD to replete as appropriate.  - Increase Ensure Enlive oral nutrition supplement TID. Each provides 350 kcal and 20 grams protein. - Continue to encourage PO intakes of meals and supplements. - Will continue to monitor for additional nutrition-related needs.  NUTRITION DIAGNOSIS:   Unintentional weight loss related to other (see comment) (unknown cause at this time) as evidenced by percent weight loss.  Ongoing.  GOAL:   Patient will meet greater than or equal to 90% of their needs  Unmet.  MONITOR:   PO intake, Supplement acceptance, Weight trends, Labs, I & O's  ASSESSMENT:   82 y.o. male who presents to the Emergency Department complaining of weakness since yesterday morning.  Pt adds that he's had a cough for a little while and has had decreased urine output since yesterday.  Pt states he was on the commode and had a large bowel movement; when he went to stand up he felt weak and had to sit back down.  He denies fall and head injury as well as vomiting, diarrhea, blood in his stool and abnormal BLE swelling.  Pt uses O2 at night and occasionally during the day.    Spoke with pt at bedside. Per chart, pt's PO intake has been variable with meal completions recorded as 20-75%. Only one meal listed for 10/4 and 10/5. Pt with unfinished lunch meal tray at bedside. Pt consumed approximately 20% of meal: 1/2 banana, 2-3 bites of cooked carrots, 1-2 bites of collards, and 1-2 bites of chicken salad. Per pt, he has "lost his taste buds." He attributes poor appetite and diminished ability to taste foods to older age.  Per order hx, pt has been consuming Ensure Enlive oral nutrition supplements as prescribed. Pt confirms. Pt amenable to increasing Ensure Enlive order to  TID.  Encouraged PO intake from meal trays and discussed importance. Pt states he will try to eat more at mealtimes.  Suspect pt is at refeeding risk due to poor PO x5 days and low electrolyte labs. Recommend daily labs of magnesium, phosphorus, and potassium. MD to replete as appropriate.  Medications reviewed and include 100 mg Colace BID, 20 mg Pepcid/day, 40 mg IV Lasix BID, sliding scale Novolog, 25 mcg Synthroid/day, MVI with minerals, 40 mg Protonix/day, 17 gm Miralax/day, 40 mEq potassium chloride BID, PRN Zofran  Labs reviewed and include low sodium (130 mmol/L), low chloride (95 mmol/L), elevated BUN (24 mg/dL), low calcium (8.5 mg/dL) CBGs: 169-277 mg/dL  NFPE: Exam completed. No fat depletion, mild muscle depletion (dorsal hand only), and no edema noted.  Diet Order:  Diet heart healthy/carb modified Room service appropriate? Yes; Fluid consistency: Thin  Skin:  Reviewed, no issues  Last BM:  Unknown  Height:   Ht Readings from Last 1 Encounters:  10/27/15 6\' 1"  (1.854 m)    Weight:   Wt Readings from Last 1 Encounters:  10/31/15 (S) 222 lb 3.6 oz (100.8 kg)    Ideal Body Weight:  83.64 kg  BMI:  Body mass index is 29.32 kg/m.  Estimated Nutritional Needs:   Kcal:  1500-1700  Protein:  65-75 grams  Fluid:  1.3-1.5 L/day  EDUCATION NEEDS:   No education needs identified at this time  Jeb Levering Dietetic Intern Pager Number: [phone]

## 2015-11-01 NOTE — Care Management Note (Signed)
Case Management Note  Patient Details  Name: [name] MRN: [id number] Date of Birth: 1932/08/24  Subjective/Objective:Spoke to dtr-Stephanie-she does not want [facility](spoke to [facility] rep) for [facility] wants [facility](spoke to Santiago Glad) if d/c plans are home-I have contacted both agencies. Now dtr wants to consider SN-CSW notified.                    Action/Plan:d/c plan SNF.   Expected Discharge Date:                  Expected Discharge Plan:  [place]  In-House Referral:     Discharge planning Services  CM Consult  Post Acute Care Choice:  Home Health, Durable Medical Equipment (Active w/[facility]-HHPT, has home 02 w/[facility] ) Choice offered to:  Patient  DME Arranged:    DME Agency:     HH Arranged:    [place] Agency:     Status of Service:  In process, will continue to follow  If discussed at Long Length of Stay Meetings, dates discussed:    Additional Comments:  Dessa Phi, RN 11/01/2015, 1:26 PM

## 2015-11-01 NOTE — Progress Notes (Signed)
Patient ID: GWEN STOCKERT, male   DOB: 08/25/1932, 82 y.o.   MRN: [id number]    PROGRESS NOTE    LATEEF TALLEY  [id number] DOB: [birth year] DOA: 10/27/2015  PCP: Christinia Gully, MD   Brief Narrative:   82 y.o. male who presents to the Emergency Department complaining of weakness, fatigue, difficulty standing up.   Assessment & Plan:   Active Problems:   Severe pancytopenia due to MDS (myelodysplastic syndrome) (HCC) - WBC continue to improved; platelets stable -no signs of acute bleeding  -neupogen (given on 10/4) - s/p post 1 units of PRBC on 10/3; Hgb 7.2 and after discussing case with Dr. [name] will transfuse another units of PRBC - CBC In AM to follow trend  -continue treatment for neutropenic fever (see below for details)    Neutropenic fever/Sepsis - pt meets criteria for sepsis (tachypneic, tachycardic, leukopenic and with elevated lactic acid)  - not sure what the specific source is  - started on vanc and zosyn as per protocol; patient now with 48 hours fever free and no source -will narrow abx's to levaquin  - follow up on lactic acid and procalcitonin as per protocol  - will repeat CXR in am    Acute metabolic encephalopathy -secondary to neutropenic fever/infection  -afebrile now and mentation essentially back to baseline -will monitor and narrow abx's as apporpriate -will follow culture data -constant reorientation to minimize sundowning     Weakness and fatigue -multifactorial  - PT eval requested and recommending short term SNF for rehab; discussed with daugther and patient and plan will be to pursuit SNF when medically stable    Acute on chronic respiratory failure due to acute diastolic CHF with right heart failure - Will continue lasix 40 mg IV -continue daily weight and Strict I's and O's -will repeat CXR in am   DVT prophylaxis: SCD's Code Status: DNR Family Communication: Patient at bedside, daughter at bedside  Disposition Plan: to be  determine; patient with physical deconditioning and might benefit of short term SNF for rehab at discharge.  Consultants:   Dr. [name] made aware of his admission through [facility]   Procedures:   None  Antimicrobials:   Vanc and Zosyn 10/03 -->   Subjective: Afebrile now for 48 hours. Patient is AAOX2 and able to follow commands properly; reports still no BM, complaining of intermittent coughing spells and generalized weakness.  Hgb 7.2 this morning; WBC's 1.2; platelets stable.  Objective: Vitals:   10/31/15 2113 11/01/15 0403 11/01/15 0803 11/01/15 0806  BP: (!) 101/59 105/61    Pulse: 91 92    Resp: (!) 22 (!) 21    Temp: 99.8 F (37.7 C) 99 F (37.2 C)    TempSrc: Oral Oral    SpO2: 98% 98% 94% 97%  Weight:      Height:        Intake/Output Summary (Last 24 hours) at 11/01/15 1358 Last data filed at 11/01/15 1103  Gross per 24 hour  Intake              460 ml  Output             2050 ml  Net            -1590 ml   Filed Weights   10/29/15 0344 10/30/15 0500 10/31/15 0454  Weight: 101.1 kg (222 lb 14.2 oz) 99.8 kg (220 lb 0.3 oz) (S) 100.8 kg (222 lb 3.6 oz)    Examination: General exam: Appears  in no distress; oriented X 2 and following commands properly. Overnight with mild episode of sundowning. Patient denies CP and endorses improvement in his breathing. He is feeling tired and fatigue; with concerns for ability of been taken care of at home. Endorses still no BM Respiratory system: diminished breath sounds at bases, mild crackles still appreciated and scattered rhonchi. Cardiovascular system: S1 & S2 heard, RRR. No JVD, rubs, gallops or clicks.  Gastrointestinal system: Abdomen is nondistended, soft and nontender. No organomegaly or masses felt.  Central nervous system: No focal neurological deficits; confusion essentially resolved. Moving four limbs spontaneously and able to follow commands properly.    Data Reviewed: I have personally reviewed following labs  and imaging studies  CBC:  Recent Labs Lab 10/27/15 0124 10/28/15 0350 10/29/15 0523 10/30/15 0440 10/31/15 0345 11/01/15 0830  WBC 0.8* 1.1* 0.7* 0.5* 0.8* 1.2*  NEUTROABS 0.6*  --   --   --  0.5* 0.6*  HGB 8.0* 8.1* 7.4* 7.6* 7.5* 7.2*  HCT 23.0* 24.0* 21.5* 22.1* 21.5* 21.2*  MCV 95.0 94.9 94.3 94.0 93.1 95.1  PLT 44* 47* 45* 43* 43* 44*   Basic Metabolic Panel:  Recent Labs Lab 10/27/15 0130 10/28/15 0350 10/29/15 0523 10/30/15 0440 10/31/15 0345  NA 133* 135 130* 132* 130*  K 3.3* 4.4 4.4 4.4 4.1  CL 100* 98* 97* 96* 95*  CO2 24 30 27 30 27   GLUCOSE 230* 179* 189* 172* 173*  BUN 15 19 17  24* 24*  CREATININE 0.76 0.74 0.73 0.81 0.86  CALCIUM 9.2 9.3 8.9 8.8* 8.5*   Liver Function Tests:  Recent Labs Lab 10/27/15 0130 10/28/15 0350  AST 29 21  ALT 36 28  ALKPHOS 48 43  BILITOT 1.2 0.8  PROT 6.7 6.7  ALBUMIN 3.6 3.3*   Cardiac Enzymes:  Recent Labs Lab 10/27/15 0130  TROPONINI <0.03   BNP (last 3 results)  Recent Labs  05/13/15 1043 07/08/15 1520 09/03/15 1445  PROBNP 271.0* 447.0* 535.0*     Recent Labs Lab 10/31/15 1218 10/31/15 1649 10/31/15 2109 11/01/15 0751 11/01/15 1217  GLUCAP 198* 271* 199* 235* 277*   Urine analysis:    Component Value Date/Time   COLORURINE YELLOW 10/29/2015 [place] 10/29/2015 1159   LABSPEC 1.014 10/29/2015 1159   PHURINE 6.5 10/29/2015 1159   GLUCOSEU NEGATIVE 10/29/2015 1159   GLUCOSEU NEGATIVE 09/03/2015 1445   HGBUR NEGATIVE 10/29/2015 1159   BILIRUBINUR NEGATIVE 10/29/2015 1159   KETONESUR NEGATIVE 10/29/2015 1159   PROTEINUR NEGATIVE 10/29/2015 1159   UROBILINOGEN 0.2 09/03/2015 1445   NITRITE NEGATIVE 10/29/2015 1159   LEUKOCYTESUR NEGATIVE 10/29/2015 1159   Radiology Studies: No results found. Scheduled Meds: . sodium chloride   Intravenous Once  . docusate sodium  100 mg Oral BID  . famotidine  20 mg Oral QHS  . feeding supplement (ENSURE ENLIVE)  237 mL Oral  TID BM  . finasteride  5 mg Oral q morning - 10a  . fluticasone  2 spray Each Nare BID  . furosemide  20 mg Intravenous Once  . furosemide  40 mg Intravenous BID  . insulin aspart  0-9 Units Subcutaneous TID WC  . ipratropium-albuterol  3 mL Nebulization BID  . levofloxacin (LEVAQUIN) IV  750 mg Intravenous Q24H  . levothyroxine  25 mcg Oral QAC breakfast  . mometasone-formoterol  2 puff Inhalation Q12H  . multivitamin with minerals  1 tablet Oral Daily  . oxybutynin  10 mg Oral Daily  .  pantoprazole  40 mg Oral Daily  . polyethylene glycol  17 g Oral Daily  . potassium chloride SA  40 mEq Oral BID  . sodium chloride flush  10-40 mL Intracatheter Q12H  . sodium chloride flush  3 mL Intravenous Q12H  . sodium chloride flush  3 mL Intravenous Q12H  . traZODone  150 mg Oral QHS   Continuous Infusions:    LOS: 3 days  Time spent: 25 minutes   Barton Dubois, MD [facility] Pager [phone]  If 7PM-7AM, please contact night-coverage [email or web address] Password [email or web address] 11/01/2015, 1:58 PM

## 2015-11-01 NOTE — Clinical Social Work Placement (Signed)
Patient has a bed at [facility]. Per [name] at [place], they would need the discharge summary by 11am for discharge Saturday or Sunday, as their pharmacy will be closed - otherwise family would need to consider another SNF. CSW has completed FL2 & will continue to follow and assist with discharge when ready.    Raynaldo Opitz, [facility] Clinical Social Worker cell #: [phone]     CLINICAL SOCIAL WORK PLACEMENT  NOTE  Date:  11/01/2015  Patient Details  Name: [name] MRN: [id number] Date of Birth: 1932-01-30  Clinical Social Work is seeking post-discharge placement for this patient at the [place] level of care (*CSW will initial, date and re-position this form in  chart as items are completed):  Yes   Patient/family provided with [facility] Work Department's list of facilities offering this level of care within the geographic area requested by the patient (or if unable, by the patient's family).  Yes   Patient/family informed of their freedom to choose among providers that offer the needed level of care, that participate in Medicare, Medicaid or managed care program needed by the patient, have an available bed and are willing to accept the patient.  Yes   Patient/family informed of [facility]'s ownership interest in [facility] and [facility], as well as of the fact that they are under no obligation to receive care at these facilities.  PASRR submitted to EDS on       PASRR number received on       Existing PASRR number confirmed on 11/01/15     FL2 transmitted to all facilities in geographic area requested by pt/family on 11/01/15     FL2 transmitted to all facilities within larger geographic area on       Patient informed that his/her managed care company has contracts with or will negotiate with certain facilities, including the following:        Yes   Patient/family informed of  bed offers received.  Patient chooses bed at [facility]     Physician recommends and patient chooses bed at      Patient to be transferred to [facility] on  .  Patient to be transferred to facility by       Patient family notified on   of transfer.  Name of family member notified:        PHYSICIAN       Additional Comment:    _______________________________________________ Standley Brooking, LCSW 11/01/2015, 2:45 PM

## 2015-11-02 ENCOUNTER — Inpatient Hospital Stay (HOSPITAL_COMMUNITY): Payer: Medicare Other

## 2015-11-02 LAB — CBC WITH DIFFERENTIAL/PLATELET
BASOS ABS: 0 10*3/uL (ref 0.0–0.1)
Basophils Relative: 0 %
EOS ABS: 0 10*3/uL (ref 0.0–0.7)
EOS PCT: 1 %
HCT: 23 % — ABNORMAL LOW (ref 39.0–52.0)
Hemoglobin: 8 g/dL — ABNORMAL LOW (ref 13.0–17.0)
Lymphocytes Relative: 38 %
Lymphs Abs: 0.4 10*3/uL — ABNORMAL LOW (ref 0.7–4.0)
MCH: 32.8 pg (ref 26.0–34.0)
MCHC: 34.8 g/dL (ref 30.0–36.0)
MCV: 94.3 fL (ref 78.0–100.0)
MONO ABS: 0.1 10*3/uL (ref 0.1–1.0)
Monocytes Relative: 5 %
NEUTROS PCT: 56 %
Neutro Abs: 0.5 10*3/uL — ABNORMAL LOW (ref 1.7–7.7)
PLATELETS: 46 10*3/uL — AB (ref 150–400)
RBC: 2.44 MIL/uL — AB (ref 4.22–5.81)
RDW: 19.5 % — ABNORMAL HIGH (ref 11.5–15.5)
WBC: 1 10*3/uL — CL (ref 4.0–10.5)

## 2015-11-02 LAB — GLUCOSE, CAPILLARY
GLUCOSE-CAPILLARY: 216 mg/dL — AB (ref 65–99)
Glucose-Capillary: 173 mg/dL — ABNORMAL HIGH (ref 65–99)

## 2015-11-02 LAB — BASIC METABOLIC PANEL
ANION GAP: 8 (ref 5–15)
BUN: 23 mg/dL — ABNORMAL HIGH (ref 6–20)
CALCIUM: 8.7 mg/dL — AB (ref 8.9–10.3)
CO2: 29 mmol/L (ref 22–32)
Chloride: 94 mmol/L — ABNORMAL LOW (ref 101–111)
Creatinine, Ser: 0.72 mg/dL (ref 0.61–1.24)
Glucose, Bld: 161 mg/dL — ABNORMAL HIGH (ref 65–99)
POTASSIUM: 4.3 mmol/L (ref 3.5–5.1)
SODIUM: 131 mmol/L — AB (ref 135–145)

## 2015-11-02 LAB — HEMOGLOBIN A1C
HEMOGLOBIN A1C: 6.5 % — AB (ref 4.8–5.6)
Mean Plasma Glucose: 140 mg/dL

## 2015-11-02 MED ORDER — FUROSEMIDE 40 MG PO TABS: 40.0000 mg | ORAL_TABLET | Freq: Two times a day (BID) | ORAL | Status: DC

## 2015-11-02 MED ORDER — POTASSIUM CHLORIDE CRYS ER 20 MEQ PO TBCR: 40.0000 meq | EXTENDED_RELEASE_TABLET | Freq: Two times a day (BID) | ORAL | Status: DC

## 2015-11-02 MED ORDER — DOCUSATE SODIUM 100 MG PO CAPS: 100.0000 mg | ORAL_CAPSULE | Freq: Two times a day (BID) | ORAL | Status: AC

## 2015-11-02 MED ORDER — ENSURE ENLIVE PO LIQD: 237.0000 mL | Freq: Three times a day (TID) | ORAL | Status: DC

## 2015-11-02 MED ORDER — TRAMADOL HCL 50 MG PO TABS: 50.0000 mg | ORAL_TABLET | Freq: Four times a day (QID) | ORAL | 0 refills | Status: AC | PRN

## 2015-11-02 MED ORDER — HEPARIN SOD (PORK) LOCK FLUSH 100 UNIT/ML IV SOLN
500.0000 [IU] | INTRAVENOUS | Status: AC | PRN
  Administered 2015-11-02: 500 [IU]

## 2015-11-02 MED ORDER — LEVOFLOXACIN 750 MG PO TABS: 750.0000 mg | ORAL_TABLET | Freq: Every day | ORAL | Status: AC

## 2015-11-02 MED ORDER — ALPRAZOLAM 0.25 MG PO TABS: 0.2500 mg | ORAL_TABLET | Freq: Every evening | ORAL | 0 refills | Status: DC | PRN

## 2015-11-02 MED ORDER — BENZONATATE 100 MG PO CAPS: 100.0000 mg | ORAL_CAPSULE | Freq: Three times a day (TID) | ORAL | Status: DC | PRN

## 2015-11-02 MED ORDER — HYDROXYZINE HCL 25 MG PO TABS: 25.0000 mg | ORAL_TABLET | Freq: Three times a day (TID) | ORAL | 0 refills | Status: DC | PRN

## 2015-11-02 NOTE — Discharge Summary (Signed)
Physician Discharge Summary  [name] DTO:[id number] DOB: [birth year] DOA: 10/27/2015  PCP: Christinia Gully, MD  Admit date: 10/27/2015 Discharge date: 11/02/2015  Time spent: 35 minutes  Recommendations for Outpatient Follow-up:  1. Repeat CXR in 3-4 weeks to follow resolution of infiltrates 2. Follow on patient BP/volume status and adjust his diuretics as needed  3. Please repeat BMET in 5 days to follow electrolytes and renal function  4. Please repeat CBC in 5 days to follow Hgb, WBC's and platelets trend  5. Follow low sodium diet (less than 2 gram daily) 6. Check weight on daily basis 7. Please follow closely on his skin for potential yeast development after antibiotics; treat accordingly as needed    Discharge Diagnoses:  Active Problems:   Other pancytopenia (HCC)   MDS (myelodysplastic syndrome) (HCC)   Chronic respiratory failure with hypoxia (HCC)   Acute on chronic congestive heart failure (HCC)   Right heart failure   Generalized weakness   Symptomatic anemia   Acute CHF (congestive heart failure) (HCC)   CHF (congestive heart failure) ([place]) diabetes type 2 Physical deconditioning  Discharge Condition: stable and improved. Discharge to SNF for further care and rehabilitation.  Diet recommendation: heart healthy diet and modified carbohydrates   Filed Weights   10/29/15 0344 10/30/15 0500 10/31/15 0454  Weight: 101.1 kg (222 lb 14.2 oz) 99.8 kg (220 lb 0.3 oz) (S) 100.8 kg (222 lb 3.6 oz)    History of present illness:  As per Dr. Wynetta Emery H&P written on 10/27/15 82 y.o. male who presents to the Emergency Department complaining of weakness (approx for 48 hours). Pt adds that he's had a cough for a little while and has had decreased urine output. Pt states he was on the commode and had a large bowel movement; when he went to stand up he felt weak and had to sit back down. He denies fall and head injury as well as vomiting, diarrhea, blood in his stool and/or  abnormal BLE swelling. Pt uses O2 at night and occasionally during the day on chornic basis to assist with SOB.[name]   Hospital Course:    Severe pancytopenia due to MDS (myelodysplastic syndrome) (HCC) -WBC continue to improved/stable; platelets stable -no signs of acute bleeding appreciated -neupogen (given on 10/4) -s/p post 2 units of PRBC on 10/3 and again on 10/6; Hgb at discharge 8.0 -recommending CBC in 5 days to follow Hgb trend  -patient will follow up with his hematologist/oncologist at discharge     Neutropenic fever/Sepsis - pt met criteria for sepsis (tachypneic, tachycardic, leukopenic and with elevated lactic acid)  - not isolated specific source; but though to be secondary to bronchiectasis/HCAP - started on vanc and zosyn as per protocol for 3 days; patient now with >72 hours w/o fever. -will narrow abx's to levaquin and complete a total of 10 days treatment  -repeat CXR in 3-4 weeks recommended to assure resolution of infiltrates    Acute metabolic encephalopathy -secondary to neutropenic fever/infection  -afebrile now and mentation essentially back to baseline -will follow final culture data (currently w/o growth) -constant reorientation to minimize sundowning recommended     Weakness and fatigue -multifactorial  - PT eval requested and recommending short term SNF for rehab; discussed with daugther and patient and plan will be to pursuit SNF for rehab.    Acute on chronic respiratory failure due to acute diastolic CHF with right heart failure -improved and compensated currently -will discharge on lasix 68m BID -continue daily weight  and low sodium diet  -repeated CXR with improvement in aeration, still with some vascular congestion  Chronic resp failure: -continue oxygen supplementation (2L) -adjust and weaned as tolerated   Diabetes type 2 -will continue modified carb diet and home hypoglycemic regimen   Procedures:  See below for x-ray reports    Consultations:  None   Discharge Exam: Vitals:   11/01/15 2008 11/02/15 0439  BP: (!) 101/57 100/67  Pulse: 100 65  Resp: 18 18  Temp: 98.2 F (36.8 C) 98.1 F (36.7 C)   General exam: Appears in no distress; oriented X 2 (at baseline per daughter, confused about date only) and following commands properly. Patient denies CP and endorses significant improvement in his breathing. He is feeling tired and fatigue. Respiratory system: diminished breath sounds at bases, no frank crackles appreciated and scattered rhonchi. Cardiovascular system: S1 & S2 heard, RRR. No JVD, rubs, gallops or clicks. Trace edema bilaterally appreciated. Gastrointestinal system: Abdomen is nondistended, soft and nontender. No organomegaly or masses felt.  Central nervous system: No focal neurological deficits; confusion essentially resolved. Moving four limbs spontaneously and able to follow commands properly.     Discharge Instructions   Discharge Instructions    (HEART FAILURE PATIENTS) Call MD:  Anytime you have any of the following symptoms: 1) 3 pound weight gain in 24 hours or 5 pounds in 1 week 2) shortness of breath, with or without a dry hacking cough 3) swelling in the hands, feet or stomach 4) if you have to sleep on extra pillows at night in order to breathe.    Complete by:  As directed    Diet - low sodium heart healthy    Complete by:  As directed    Discharge instructions    Complete by:  As directed    Maintain adequate hydration  Take medications as prescribed  Please follow up with PCP in 2 weeks after discharge from SNF Please repeat BMET in 5 days to follow electrolytes and renal function  Please repeat CBC in 5 days to follow Hgb, WBC's and platelets trend  Follow low sodium diet (less than 2 gram daily) Check weight on daily basis     Current Discharge Medication List    START taking these medications   Details  benzonatate (TESSALON) 100 MG capsule Take 1 capsule (100 mg  total) by mouth 3 (three) times daily as needed for cough.    feeding supplement, ENSURE ENLIVE, (ENSURE ENLIVE) LIQD Take 237 mLs by mouth 3 (three) times daily between meals.    levofloxacin (LEVAQUIN) 750 MG tablet Take 1 tablet (750 mg total) by mouth daily.      CONTINUE these medications which have CHANGED   Details  ALPRAZolam (XANAX) 0.25 MG tablet Take 1 tablet (0.25 mg total) by mouth at bedtime as needed for anxiety or sleep. Qty: 20 tablet, Refills: 0    docusate sodium (COLACE) 100 MG capsule Take 1 capsule (100 mg total) by mouth 2 (two) times daily. Hold for constipation    furosemide (LASIX) 40 MG tablet Take 1 tablet (40 mg total) by mouth 2 (two) times daily.    hydrOXYzine (ATARAX/VISTARIL) 25 MG tablet Take 1 tablet (25 mg total) by mouth every 8 (eight) hours as needed for itching. Qty: 20 tablet, Refills: 0    potassium chloride SA (KLOR-CON M20) 20 MEQ tablet Take 2 tablets (40 mEq total) by mouth 2 (two) times daily.    traMADol (ULTRAM) 50 MG tablet Take 1  tablet (50 mg total) by mouth every 6 (six) hours as needed. Qty: 20 tablet, Refills: 0      CONTINUE these medications which have NOT CHANGED   Details  albuterol (PROVENTIL HFA;VENTOLIN HFA) 108 (90 Base) MCG/ACT inhaler Inhale 2 puffs into the lungs every 4 (four) hours as needed for wheezing or shortness of breath.    albuterol (PROVENTIL) (2.5 MG/3ML) 0.083% nebulizer solution Take 3 mLs (2.5 mg total) by nebulization every 4 (four) hours as needed for wheezing or shortness of breath. Qty: 75 mL, Refills: 12    calcium-vitamin D (OS-CAL 500 + D) 500-200 MG-UNIT per tablet Take 1 tablet by mouth 2 (two) times daily.     dextromethorphan-guaiFENesin (MUCINEX DM) 30-600 MG 12hr tablet Take 1-2 tablets by mouth 2 (two) times daily as needed for cough.    famotidine (PEPCID) 20 MG tablet Take 20 mg by mouth at bedtime.     finasteride (PROSCAR) 5 MG tablet TAKE 1 TABLET BY MOUTH EVERY MORNING Qty: 30  tablet, Refills: 5    fluticasone (FLONASE) 50 MCG/ACT nasal spray 1-2 puffs each nostril once or twice daily for allergy Qty: 16 g, Refills: 12    levothyroxine (SYNTHROID, LEVOTHROID) 25 MCG tablet TAKE 1 TABLET BY MOUTH EVERY MORNING BEFORE BREAKFAST Qty: 30 tablet, Refills: 11    metFORMIN (GLUCOPHAGE) 500 MG tablet TAKE 1 TABLET BY MOUTH TWICE A DAY WITH FOOD Qty: 60 tablet, Refills: 5    mometasone-formoterol (DULERA) 200-5 MCG/ACT AERO Inhale 2 puffs into the lungs every 12 (twelve) hours.    Multiple Vitamin (MULTIVITAMIN) tablet Take 1 tablet by mouth every morning.     omeprazole (PRILOSEC) 20 MG capsule Take 40 mg by mouth daily before breakfast.     oxybutynin (DITROPAN-XL) 10 MG 24 hr tablet Take 10 mg by mouth every morning. Reported on 05/13/2015    OXYGEN 2 lpm with sleep    polyethylene glycol (MIRALAX / GLYCOLAX) packet Take 17 g by mouth daily as needed for moderate constipation or severe constipation.     traZODone (DESYREL) 150 MG tablet Take 1 tablet (150 mg total) by mouth at bedtime. Qty: 30 tablet, Refills: 0    nitroGLYCERIN (NITROSTAT) 0.4 MG SL tablet Place 1 tablet (0.4 mg total) under the tongue every 5 (five) minutes as needed for chest pain. Qty: 90 tablet, Refills: 3      STOP taking these medications     chlorpheniramine (CHLOR-TRIMETON) 4 MG tablet        No Known Allergies Follow-up Information    SHADAD,FIRAS, MD. Call today.   Specialty:  Oncology Why:  office for appointment details  Contact information: [place]. [place] [id number] [phone]           The results of significant diagnostics from this hospitalization (including imaging, microbiology, ancillary and laboratory) are listed below for reference.    Significant Diagnostic Studies: Dg Chest 2 View  Result Date: 11/02/2015 CLINICAL DATA:  Shortness of Breath EXAM: CHEST  2 VIEW COMPARISON:  October 29, 2015 chest radiograph and chest CT October 27, 2015  FINDINGS: There is interstitial edema with small pleural effusions bilaterally. There is cardiomegaly. The pulmonary vascularity is within normal limits. Pacemaker leads are attached to the right atrium and right ventricle. Port-A-Cath tip is at the cavoatrial junction. No pneumothorax. No adenopathy. Bones are osteoporotic. No focal bone lesions are evident. There is atherosclerotic calcification in the aorta. There is a moderate hiatal hernia. IMPRESSION: Evidence of congestive  heart failure. No airspace consolidation. Aortic atherosclerosis. Moderate hiatal hernia. Electronically Signed   By: Lowella Grip III M.D.   On: 11/02/2015 10:27   Dg Chest 2 View  Result Date: 10/29/2015 CLINICAL DATA:  Short of breath EXAM: CHEST  2 VIEW COMPARISON:  Chest x-ray 10 1 2017 FINDINGS: Sternotomy wires overlie nor enlarged mal cardiac silhouette. RIGHT power port noted. Mild interstitial edema pattern noted. No focal consolidation. No pneumothorax. IMPRESSION: Cardiomegaly and mild interstitial edema. Electronically Signed   By: Suzy Bouchard M.D.   On: 10/29/2015 11:17   Dg Chest 2 View  Result Date: 10/27/2015 CLINICAL DATA:  Cough, shortness of breath, weakness, and decreased urine output since yesterday. EXAM: CHEST  2 VIEW COMPARISON:  10/08/2015 FINDINGS: Cardiac pacemaker. Postoperative changes in the mediastinum. Shallow inspiration with atelectasis in the lung bases. Cardiac enlargement with diffuse pulmonary vascular congestion. Slight interstitial pattern to the lung bases suggesting early edema. Vascular congestion is increasing since previous study. No blunting of costophrenic angles. No pneumothorax. Emphysematous changes in the lungs. Calcified and tortuous aorta IMPRESSION: Cardiac enlargement with increasing pulmonary vascular congestion and early interstitial edema developing since prior study. Electronically Signed   By: Lucienne Capers M.D.   On: 10/27/2015 02:03   Dg Chest 2  View  Result Date: 10/08/2015 CLINICAL DATA:  Follow-up pneumonia, cough EXAM: CHEST  2 VIEW COMPARISON:  09/10/2015 FINDINGS: Cardiomegaly again noted. Dual lead cardiac pacemaker is unchanged in position. No pulmonary edema. Persistent streaky atelectasis or infiltrate left base retrocardiac. Follow-up to resolution after appropriate treatment is recommended. Osteopenia and mild degenerative changes thoracic spine. Status post median sternotomy. IMPRESSION: Cardiomegaly. Persistent streaky atelectasis or infiltrate left base retrocardiac. Follow-up to resolution is recommended. Electronically Signed   By: Lahoma Crocker M.D.   On: 10/08/2015 15:17   Ct Angio Chest Pe W Or Wo Contrast  Result Date: 10/27/2015 CLINICAL DATA:  Shortness of breath. Episode of weakness following a large bowel movement last night. EXAM: CT ANGIOGRAPHY CHEST WITH CONTRAST TECHNIQUE: Multidetector CT imaging of the chest was performed using the standard protocol during bolus administration of intravenous contrast. Multiplanar CT image reconstructions and MIPs were obtained to evaluate the vascular anatomy. CONTRAST:  100 mL Isovue 370 COMPARISON:  09/10/2015 FINDINGS: Cardiovascular: Technically adequate study with good opacification of the central and segmental pulmonary arteries. No focal filling defects demonstrated. No evidence of significant pulmonary embolus. Cardiac enlargement. Reflux of contrast material into the hepatic veins suggests right heart failure. Coronary artery bypass. Calcification of the aorta without aneurysm. Mediastinum/Nodes: Postoperative changes in the mediastinum. Scattered mediastinal lymph nodes are not pathologically enlarged. Esophagus is decompressed. Moderate-sized esophageal hiatal hernia. Lungs/Pleura: Minimal left pleural effusion. Emphysematous changes in the lungs. Diffuse interstitial fibrosis. Patchy airspace infiltrates in the left mid and lower lung zones demonstrating improvement since  previous study. No pneumothorax. Airways are patent. Upper Abdomen: No acute abnormality. Musculoskeletal: Postoperative median sternotomy. Degenerative changes in the spine. No destructive bone lesions. Review of the MIP images confirms the above findings. IMPRESSION: No evidence of significant pulmonary embolus. Cardiac enlargement with signs of right heart failure. Emphysematous changes and fibrosis in the lungs with improving airspace infiltrates since previous study. Minimal left pleural effusion. Electronically Signed   By: Lucienne Capers M.D.   On: 10/27/2015 04:28    Microbiology: Recent Results (from the past 240 hour(s))  MRSA PCR Screening     Status: None   Collection Time: 10/28/15  9:57 AM  Result Value Ref Range  Status   MRSA by PCR NEGATIVE NEGATIVE Final    Comment:        The GeneXpert MRSA Assay (FDA approved for NASAL specimens only), is one component of a comprehensive MRSA colonization surveillance program. It is not intended to diagnose MRSA infection nor to guide or monitor treatment for MRSA infections.   Culture, expectorated sputum-assessment     Status: None   Collection Time: 10/29/15  3:32 PM  Result Value Ref Range Status   Specimen Description SPUTUM  Final   Special Requests NONE  Final   Sputum evaluation THIS SPECIMEN IS ACCEPTABLE FOR SPUTUM CULTURE  Final   Report Status 10/29/2015 FINAL  Final  Culture, respiratory (NON-Expectorated)     Status: None   Collection Time: 10/29/15  3:32 PM  Result Value Ref Range Status   Specimen Description SPUTUM  Final   Special Requests NONE  Final   Gram Stain   Final    ABUNDANT WBC PRESENT, PREDOMINANTLY PMN ABUNDANT GRAM NEGATIVE RODS MODERATE GRAM POSITIVE COCCI IN PAIRS IN CLUSTERS FEW GRAM POSITIVE RODS RARE YEAST    Culture   Final    ABUNDANT HAEMOPHILUS INFLUENZAE BETA LACTAMASE NEGATIVE Performed at [facility]    Report Status 11/01/2015 FINAL  Final  Culture, blood (x 2)      Status: None (Preliminary result)   Collection Time: 10/29/15  6:05 PM  Result Value Ref Range Status   Specimen Description BLOOD RIGHT ARM  Final   Special Requests BOTTLES DRAWN AEROBIC AND ANAEROBIC 5CC  Final   Culture   Final    NO GROWTH 3 DAYS Performed at [facility]    Report Status PENDING  Incomplete  Culture, blood (x 2)     Status: None (Preliminary result)   Collection Time: 10/29/15  6:05 PM  Result Value Ref Range Status   Specimen Description BLOOD RIGHT ARM  Final   Special Requests BOTTLES DRAWN AEROBIC AND ANAEROBIC 5CC  Final   Culture   Final    NO GROWTH 3 DAYS Performed at [facility]    Report Status PENDING  Incomplete     Labs: Basic Metabolic Panel:  Recent Labs Lab 10/28/15 0350 10/29/15 0523 10/30/15 0440 10/31/15 0345 11/02/15 0415  NA 135 130* 132* 130* 131*  K 4.4 4.4 4.4 4.1 4.3  CL 98* 97* 96* 95* 94*  CO2 30 27 30 27 29   GLUCOSE 179* 189* 172* 173* 161*  BUN 19 17 24* 24* 23*  CREATININE 0.74 0.73 0.81 0.86 0.72  CALCIUM 9.3 8.9 8.8* 8.5* 8.7*   Liver Function Tests:  Recent Labs Lab 10/27/15 0130 10/28/15 0350  AST 29 21  ALT 36 28  ALKPHOS 48 43  BILITOT 1.2 0.8  PROT 6.7 6.7  ALBUMIN 3.6 3.3*   CBC:  Recent Labs Lab 10/27/15 0124  10/29/15 0523 10/30/15 0440 10/31/15 0345 11/01/15 0830 11/02/15 0415  WBC 0.8*  < > 0.7* 0.5* 0.8* 1.2* 1.0*  NEUTROABS 0.6*  --   --   --  0.5* 0.6* 0.5*  HGB 8.0*  < > 7.4* 7.6* 7.5* 7.2* 8.0*  HCT 23.0*  < > 21.5* 22.1* 21.5* 21.2* 23.0*  MCV 95.0  < > 94.3 94.0 93.1 95.1 94.3  PLT 44*  < > 45* 43* 43* 44* 46*  < > = values in this interval not displayed. Cardiac Enzymes:  Recent Labs Lab 10/27/15 0130  TROPONINI <0.03   BNP: BNP (last 3 results)  Recent Labs  01/18/15 0940 09/10/15 0100 10/27/15 0130  BNP 668.7* 253.7* 184.5*    ProBNP (last 3 results)  Recent Labs  05/13/15 1043 07/08/15 1520 09/03/15 1445  PROBNP 271.0* 447.0* 535.0*     CBG:  Recent Labs Lab 11/01/15 0751 11/01/15 1217 11/01/15 1732 11/01/15 2042 11/02/15 0717  GLUCAP 235* 277* 166* 264* 216*    Signed:  Barton Dubois MD.  [facility] 11/02/2015, 11:00 AM

## 2015-11-02 NOTE — Progress Notes (Signed)
Called report to Zena at [facility].  Barbee Shropshire. Brigitte Pulse, RN

## 2015-11-02 NOTE — Clinical Social Work Note (Signed)
Patient will discharge today per MD order. Patient will discharge to (return): [facility] SNF RN to call report prior to transportation to: [phone] Caryl Asp RN) Transportation: daughter at bedside to transport  DC summary sent to [employer] faxed: [phone]  CSW sent discharge summary to SNF for review.    Nonnie Done, MSW, LCSW  ([id number]) [phone]  Licensed Clinical Social Worker

## 2015-11-03 LAB — CULTURE, BLOOD (ROUTINE X 2)
CULTURE: NO GROWTH
CULTURE: NO GROWTH

## 2015-11-04 ENCOUNTER — Encounter: Payer: Self-pay | Admitting: *Deleted

## 2015-11-04 ENCOUNTER — Other Ambulatory Visit: Payer: Self-pay | Admitting: *Deleted

## 2015-11-04 ENCOUNTER — Telehealth: Payer: Self-pay | Admitting: *Deleted

## 2015-11-04 DIAGNOSIS — I5020 Unspecified systolic (congestive) heart failure: Secondary | ICD-10-CM

## 2015-11-04 LAB — TYPE AND SCREEN
ABO/RH(D): A POS
Antibody Screen: NEGATIVE
Unit division: 0

## 2015-11-04 NOTE — Progress Notes (Signed)
Advised by Rowe Clack, RN, [facility], that pt does not wish to participate in our Transition of care program as he has home health and he and his wife feel this is sufficient. The case has been closed and I have taken myself off his care team.  Deloria Lair [facility] [place] [phone]

## 2015-11-04 NOTE — Telephone Encounter (Signed)
LMTCB for [name], pt's daughter

## 2015-11-04 NOTE — Telephone Encounter (Signed)
[name] returned call Pt is in a SNF for rehab and will be there for the next 3-4 weeks She is okay with moving forward with the echo Order placed Nothing further needed; will sign off

## 2015-11-04 NOTE — Telephone Encounter (Signed)
-----   Message from Tanda Rockers, MD sent at 11/04/2015  8:47 AM EDT ----- Needs 2 d echo before ov dx systolic chf

## 2015-11-05 ENCOUNTER — Encounter: Payer: Self-pay | Admitting: Cardiology

## 2015-11-06 ENCOUNTER — Ambulatory Visit (HOSPITAL_BASED_OUTPATIENT_CLINIC_OR_DEPARTMENT_OTHER): Payer: Medicare Other

## 2015-11-06 ENCOUNTER — Ambulatory Visit (HOSPITAL_COMMUNITY)
Admission: RE | Admit: 2015-11-06 | Discharge: 2015-11-06 | Disposition: A | Discharge status: Home / Self Care | Payer: No Typology Code available for payment source | Source: Ambulatory Visit | Attending: Oncology | Admitting: Oncology

## 2015-11-06 ENCOUNTER — Other Ambulatory Visit (HOSPITAL_BASED_OUTPATIENT_CLINIC_OR_DEPARTMENT_OTHER): Payer: Medicare Other

## 2015-11-06 ENCOUNTER — Other Ambulatory Visit: Payer: Self-pay | Admitting: *Deleted

## 2015-11-06 DIAGNOSIS — D649 Anemia, unspecified: Secondary | ICD-10-CM

## 2015-11-06 DIAGNOSIS — D469 Myelodysplastic syndrome, unspecified: Secondary | ICD-10-CM

## 2015-11-06 DIAGNOSIS — D61818 Other pancytopenia: Secondary | ICD-10-CM | POA: Diagnosis not present

## 2015-11-06 LAB — CBC WITH DIFFERENTIAL/PLATELET
BASO%: 0 % (ref 0.0–2.0)
Basophils Absolute: 0 10*3/uL (ref 0.0–0.1)
EOS ABS: 0 10*3/uL (ref 0.0–0.5)
EOS%: 1.2 % (ref 0.0–7.0)
HEMATOCRIT: 26.3 % — AB (ref 38.4–49.9)
HGB: 9 g/dL — ABNORMAL LOW (ref 13.0–17.1)
LYMPH%: 44.4 % (ref 14.0–49.0)
MCH: 32.5 pg (ref 27.2–33.4)
MCHC: 34.2 g/dL (ref 32.0–36.0)
MCV: 94.9 fL (ref 79.3–98.0)
MONO#: 0 10*3/uL — AB (ref 0.1–0.9)
MONO%: 1.2 % (ref 0.0–14.0)
NEUT%: 53.2 % (ref 39.0–75.0)
NEUTROS ABS: 0.4 10*3/uL — AB (ref 1.5–6.5)
PLATELETS: 50 10*3/uL — AB (ref 140–400)
RBC: 2.77 10*6/uL — AB (ref 4.20–5.82)
RDW: 19.8 % — ABNORMAL HIGH (ref 11.0–14.6)
WBC: 0.8 10*3/uL — AB (ref 4.0–10.3)
lymph#: 0.4 10*3/uL — ABNORMAL LOW (ref 0.9–3.3)

## 2015-11-06 LAB — PREPARE RBC (CROSSMATCH)

## 2015-11-06 MED ORDER — SODIUM CHLORIDE 0.9% FLUSH
10.0000 mL | INTRAVENOUS | Status: AC | PRN
  Administered 2015-11-06: 10 mL
  Filled 2015-11-06: qty 10

## 2015-11-06 MED ORDER — HEPARIN SOD (PORK) LOCK FLUSH 100 UNIT/ML IV SOLN
250.0000 [IU] | INTRAVENOUS | Status: DC | PRN
  Filled 2015-11-06: qty 5

## 2015-11-06 MED ORDER — DIPHENHYDRAMINE HCL 25 MG PO CAPS
ORAL_CAPSULE | ORAL | Status: AC
  Filled 2015-11-06: qty 1

## 2015-11-06 MED ORDER — ACETAMINOPHEN 325 MG PO TABS
ORAL_TABLET | ORAL | Status: AC
  Filled 2015-11-06: qty 2

## 2015-11-06 MED ORDER — SODIUM CHLORIDE 0.9% FLUSH
3.0000 mL | INTRAVENOUS | Status: DC | PRN
  Filled 2015-11-06: qty 10

## 2015-11-06 MED ORDER — ACETAMINOPHEN 325 MG PO TABS: 650.0000 mg | ORAL_TABLET | Freq: Once | ORAL | Status: DC

## 2015-11-06 MED ORDER — DIPHENHYDRAMINE HCL 25 MG PO CAPS: 25.0000 mg | ORAL_CAPSULE | Freq: Once | ORAL | Status: DC

## 2015-11-06 MED ORDER — SODIUM CHLORIDE 0.9 % IV SOLN
250.0000 mL | Freq: Once | INTRAVENOUS | Status: AC
  Administered 2015-11-06: 250 mL via INTRAVENOUS

## 2015-11-06 MED ORDER — HEPARIN SOD (PORK) LOCK FLUSH 100 UNIT/ML IV SOLN
500.0000 [IU] | Freq: Every day | INTRAVENOUS | Status: AC | PRN
  Administered 2015-11-06: 500 [IU]
  Filled 2015-11-06: qty 5

## 2015-11-06 NOTE — Patient Instructions (Signed)
Neutropenia Neutropenia is a condition that occurs when the level of a certain type of white blood cell (neutrophil) in your body becomes lower than normal. Neutrophils are made in the bone marrow and fight infections. These cells protect against bacteria and viruses. The fewer neutrophils you have, and the longer your body remains without them, the greater your risk of getting a severe infection becomes. CAUSES  The cause of neutropenia may be hard to determine. However, it is usually due to 3 main problems:   Decreased production of neutrophils. This may be due to:  Certain medicines such as chemotherapy.  Genetic problems.  Cancer.  Radiation treatments.  Vitamin deficiency.  Some pesticides.  Increased destruction of neutrophils. This may be due to:  Overwhelming infections.  Hemolytic anemia. This is when the body destroys its own blood cells.  Chemotherapy.  Neutrophils moving to areas of the body where they cannot fight infections. This may be due to:  Dialysis procedures.  Conditions where the spleen becomes enlarged. Neutrophils are held in the spleen and are not available to the rest of the body.  Overwhelming infections. The neutrophils are held in the area of the infection and are not available to the rest of the body. SYMPTOMS  There are no specific symptoms of neutropenia. The lack of neutrophils can result in an infection, and an infection can cause various problems. DIAGNOSIS  Diagnosis is made by a blood test. A complete blood count is performed. The normal level of neutrophils in human blood differs with age and race. Infants have lower counts than older children and adults. African Americans have lower counts than Caucasians or Asians. The average adult level is 1500 cells/mm3 of blood. Neutrophil counts are interpreted as follows:  Greater than 1000 cells/mm3 gives normal protection against infection.  500 to 1000 cells/mm3 gives an increased risk for  infection.  200 to 500 cells/mm3 is a greater risk for severe infection.  Lower than 200 cells/mm3 is a marked risk of infection. This may require hospitalization and treatment with antibiotic medicines. TREATMENT  Treatment depends on the underlying cause, severity, and presence of infections or symptoms. It also depends on your health. Your caregiver will discuss the treatment plan with you. Mild cases are often easily treated and have a good outcome. Preventative measures may also be started to limit your risk of infections. Treatment can include:  Taking antibiotics.  Stopping medicines that are known to cause neutropenia.  Correcting nutritional deficiencies by eating green vegetables to supply folic acid and taking vitamin B supplements.  Stopping exposure to pesticides if your neutropenia is related to pesticide exposure.  Taking a blood growth factor called sargramostim, pegfilgrastim, or filgrastim if you are undergoing chemotherapy for cancer. This stimulates white blood cell production.  Removal of the spleen if you have Felty's syndrome and have repeated infections. HOME CARE INSTRUCTIONS   Follow your caregiver's instructions about when you need to have blood work done.  Wash your hands often. Make sure others who come in contact with you also wash their hands.  Wash raw fruits and vegetables before eating them. They can carry bacteria and fungi.  Avoid people with colds or spreadable (contagious) diseases (chickenpox, herpes zoster, influenza).  Avoid large crowds.  Avoid construction areas. The dust can release fungus into the air.  Be cautious around children in daycare or school environments.  Take care of your respiratory system by coughing and deep breathing.  Bathe daily.  Protect your skin from cuts and   burns.  Do not work in the garden or with flowers and plants.  Care for the mouth before and after meals by brushing with a soft toothbrush. If you have  mucositis, do not use mouthwash. Mouthwash contains alcohol and can dry out the mouth even more.  Clean the area between the genitals and the anus (perineal area) after urination and bowel movements. Women need to wipe from front to back.  Use a water soluble lubricant during sexual intercourse and practice good hygiene after. Do not have intercourse if you are severely neutropenic. Check with your caregiver for guidelines.  Exercise daily as tolerated.  Avoid people who were vaccinated with a live vaccine in the past 30 days. You should not receive live vaccines (polio, typhoid).  Do not provide direct care for pets. Avoid animal droppings. Do not clean litter boxes and bird cages.  Do not share food utensils.  Do not use tampons, enemas, or rectal suppositories unless directed by your caregiver.  Use an electric razor to remove hair.  Wash your hands after handling magazines, letters, and newspapers. SEEK IMMEDIATE MEDICAL CARE IF:   You have a fever.  You have chills or start to shake.  You feel nauseous or vomit.  You develop mouth sores.  You develop aches and pains.  You have redness and swelling around open wounds.  Your skin is warm to the touch.  You have pus coming from your wounds.  You develop swollen lymph nodes.  You feel weak or fatigued.  You develop red streaks on the skin. MAKE SURE YOU:  Understand these instructions.  Will watch your condition.  Will get help right away if you are not doing well or get worse.   This information is not intended to replace advice given to you by your health care provider. Make sure you discuss any questions you have with your health care provider.   Document Released: 07/04/2001 Document Revised: 04/06/2011 Document Reviewed: 07/25/2014 Elsevier Interactive Patient Education 2016 [place].    Blood Transfusion, Care After These instructions give you information about caring for yourself after your  procedure. Your doctor may also give you more specific instructions. Call your doctor if you have any problems or questions after your procedure.  HOME CARE  Take medicines only as told by your doctor. Ask your doctor if you can take an over-the-counter pain reliever if you have a fever or headache a day or two after your procedure.  Return to your normal activities as told by your doctor. GET HELP IF:   You develop redness or irritation at your IV site.  You have a fever, chills, or a headache that does not go away.  Your pee (urine) is darker than normal.  Your urine turns:  Pink.  Red.  [place]ark.  The white part of your eye turns yellow (jaundice).  You feel weak after doing your normal activities. GET HELP RIGHT AWAY IF:   You have trouble breathing.  You have fever and chills and you also have:  Anxiety.  Chest or back pain.  Flushed or pink skin.  Clammy or sweaty skin.  A fast heartbeat.  A sick feeling in your stomach (nausea).   This information is not intended to replace advice given to you by your health care provider. Make sure you discuss any questions you have with your health care provider.   Document Released: 02/02/2014 Document Reviewed: 02/02/2014 Elsevier Interactive Patient Education [employer].

## 2015-11-07 LAB — CUP PACEART REMOTE DEVICE CHECK
Battery Impedance: 157 Ohm
Brady Statistic AP VS Percent: 0 %
Brady Statistic AS VP Percent: 95 %
Brady Statistic AS VS Percent: 4 %
Implantable Lead Location: 753860
Implantable Lead Model: 5076
Lead Channel Impedance Value: 414 Ohm
Lead Channel Pacing Threshold Pulse Width: 0.4 ms
Lead Channel Pacing Threshold Pulse Width: 0.4 ms
Lead Channel Setting Pacing Amplitude: 2.5 V
Lead Channel Setting Sensing Sensitivity: 4 mV
MDC IDC LEAD IMPLANT DT: 20141204
MDC IDC LEAD IMPLANT DT: 20141204
MDC IDC LEAD LOCATION: 753859
MDC IDC MSMT BATTERY REMAINING LONGEVITY: 112 mo
MDC IDC MSMT BATTERY VOLTAGE: 2.78 V
MDC IDC MSMT LEADCHNL RA IMPEDANCE VALUE: 453 Ohm
MDC IDC MSMT LEADCHNL RA PACING THRESHOLD AMPLITUDE: 0.75 V
MDC IDC MSMT LEADCHNL RV PACING THRESHOLD AMPLITUDE: 0.75 V
MDC IDC SESS DTM: 20170920202554
MDC IDC SET LEADCHNL RA PACING AMPLITUDE: 2 V
MDC IDC SET LEADCHNL RV PACING PULSEWIDTH: 0.4 ms
MDC IDC STAT BRADY AP VP PERCENT: 1 %

## 2015-11-07 LAB — TYPE AND SCREEN
ABO/RH(D): A POS
Antibody Screen: NEGATIVE
Unit division: 0

## 2015-11-14 ENCOUNTER — Other Ambulatory Visit: Payer: Self-pay

## 2015-11-14 ENCOUNTER — Ambulatory Visit (HOSPITAL_COMMUNITY): Payer: No Typology Code available for payment source | Attending: Cardiovascular Disease

## 2015-11-14 DIAGNOSIS — R0600 Dyspnea, unspecified: Secondary | ICD-10-CM | POA: Insufficient documentation

## 2015-11-14 DIAGNOSIS — I5020 Unspecified systolic (congestive) heart failure: Secondary | ICD-10-CM | POA: Diagnosis not present

## 2015-11-14 DIAGNOSIS — I371 Nonrheumatic pulmonary valve insufficiency: Secondary | ICD-10-CM | POA: Diagnosis not present

## 2015-11-14 DIAGNOSIS — Z87891 Personal history of nicotine dependence: Secondary | ICD-10-CM | POA: Insufficient documentation

## 2015-11-14 DIAGNOSIS — D649 Anemia, unspecified: Secondary | ICD-10-CM | POA: Insufficient documentation

## 2015-11-14 DIAGNOSIS — I340 Nonrheumatic mitral (valve) insufficiency: Secondary | ICD-10-CM | POA: Diagnosis not present

## 2015-11-14 DIAGNOSIS — I071 Rheumatic tricuspid insufficiency: Secondary | ICD-10-CM | POA: Diagnosis not present

## 2015-11-15 ENCOUNTER — Telehealth: Payer: Self-pay | Admitting: Cardiology

## 2015-11-15 NOTE — Telephone Encounter (Signed)
Pt daughter wanted to know what pt HR settings on his PPM was at. I informed her that it is between 60-120 BPM. Pt daughter verbalized understanding.

## 2015-11-17 ENCOUNTER — Other Ambulatory Visit: Payer: Self-pay | Admitting: Adult Health

## 2015-11-18 ENCOUNTER — Encounter: Payer: Self-pay | Admitting: Adult Health

## 2015-11-18 ENCOUNTER — Ambulatory Visit (INDEPENDENT_AMBULATORY_CARE_PROVIDER_SITE_OTHER): Payer: Medicare Other | Admitting: Adult Health

## 2015-11-18 ENCOUNTER — Ambulatory Visit: Payer: Medicare Other | Admitting: Internal Medicine

## 2015-11-18 DIAGNOSIS — D649 Anemia, unspecified: Secondary | ICD-10-CM | POA: Diagnosis not present

## 2015-11-18 DIAGNOSIS — J9611 Chronic respiratory failure with hypoxia: Secondary | ICD-10-CM

## 2015-11-18 DIAGNOSIS — I5032 Chronic diastolic (congestive) heart failure: Secondary | ICD-10-CM

## 2015-11-18 MED ORDER — POTASSIUM CHLORIDE CRYS ER 20 MEQ PO TBCR: 20.0000 meq | EXTENDED_RELEASE_TABLET | Freq: Two times a day (BID) | ORAL | 1 refills | Status: AC

## 2015-11-18 MED ORDER — FUROSEMIDE 40 MG PO TABS: 40.0000 mg | ORAL_TABLET | Freq: Every day | ORAL | 1 refills | Status: AC

## 2015-11-18 NOTE — Assessment & Plan Note (Signed)
Appears compensated without fluid overload Patient's medications were reviewed today and patient education was given. Computerized medication calendar was adjusted/completed   Plan  Patient Instructions  Follow med calendar closely and bring to each visit.   Follow up in 6 weeks with Dr. [name]  And As needed

## 2015-11-18 NOTE — Progress Notes (Signed)
Subjective:    Patient ID: [name], male    DOB: [birth year]  MRN: [id number]  History of Present Illness  82 year old white male, former smoker( Quit date 32), with mild obesity complicated by aodm, Lymphoma s/p chemo ([facility]) and tendency to peripheral edema that is felt to be dependent and related to obesity/ venous insuff.Here for Follow up hospital admission visit.   Admit date: 01/18/2015 Discharge date: 01/22/2015  Discharge Diagnoses:  Principal Problem:  Sepsis due to pneumonia [facility]) Active Problems:  Lobar pneumonia, unspecified organism ([place])  Lactic acidosis  Pancytopenia (HCC)  MDS (myelodysplastic syndrome) (HCC)  Neutropenia (HCC)  Thrombocytopenia (HCC)  Anemia of chronic disease  Pacemaker  Chronic diastolic CHF (congestive heart failure) ([place])     03/05/2015 Follow up : DCHF , Cough variant asthma , DM  Pt returns for a 3 week follow up .  Says overall he is doing better. Recovering from PNA.  Clinically is improving with improved energy.  Working with PT at home.  Now able to walk with cane.  Minimal cough and congestion . No fever or discolored mucus.  No hemoptysis .  No increased leg swelling or orthopnea  Needs updated med calendar list, reviewed with pt and daughter.  Would like a refill of xanax, uses it at night to help with sleep. On low dose  rec No changes  04/12/15 Dr Donalee Citrin Script sent for doxycycline antibiotic Ok to take a probiotic with the antibiotic Script sent changing Dulera to Symbicort maintenance inhaler Script sent changing nasonex to flonase nasal spray     05/13/2015 extended  f/u ov/Wert re: sob /cough ? Asthma related  Chief Complaint  Patient presents with  . Follow-up    Breathing is progressively worse. He states he gets winded with very minimal exertion. Cough has not improved since the last visit. He still has occ wheeze.   cough is sporadic more often sitting in chair / and w/in 10 min  at hs x months with minimal mucoid sputum produced and no am excess  Breathing worse p change to symbicort from dulera but turns out at baseline using the neb bid, not prn as per med calendar  Doe = MMRC3 = can't walk 100 yards even at a slow pace at a flat grade s stopping due to sob  (not reproduced on walk test this ov) Much better after tx of prbc's vs prior. Using saba multiple forms hfa and neb rec Walk slower if getting short of breath with activity  Change trazadone 150 one whole at bedtime Restart dulera 200 2 every 12 hours  For drainage / throat tickle try take CHLORPHENIRAMINE  4 mg - take one every 4 hours as needed   Work on inhaler technique:   Plan A = Automatic = dulera 200 Take 2 puffs first thing in am and then another 2 puffs about 12 hours later  Plan B = Backup Only use your albuterol (proair) as a rescue medication. - Ok to use the inhaler up to 2 puffs  every 4 hours if you must but call for appointment if use goes up over your usual need - Don't leave home without it !!  (think of it like the spare tire for your car)  Plan C = Crisis/ Contringency - only use your albuterol nebulizer if you first try Plan B  Depomedrol 120 today     06/10/2015  Extended  f/u ov/Wert re: doe/ restriction on pfts (p  am dulera 200) Chief Complaint  Patient presents with  . Follow-up    Breathing is unchanged. No new co's today.   thinks maybe a little better sob p depomedrol 120 last ov but biggest change was with tx prbcs - using saba in hfa form only (never neb) around noon and 6 pm though supposed to be using dulera 200 2 when wakes and 12 h later per med calendar, very easily confused with details of care / can't seem to remember whether he's bothered by cough or not - could not find chlorpheniramine  >>depo medrol shot   07/08/2015 NP Follow up : Med calendar and Cough variant Asthma / MDS /D-CHF   admitted 06/30/15 for acute bronchitis, tx w/ abx. He has MDS with pancytopenia  requiring frequent blood  transfusions to keep Hbg >9.  He received 2 u PRBC during admission ,  Hbg at discharge last week was 8.8.  Pt brought all his meds in today , we reviewed them and organized them into a med calendar with pt education.  His daughter helps him with his meds. Appears to be taking correctly.  He remains sob and weak since discharge. They are worried hbg is getting worse.  He has follow up with Hematology next week.  No known bleeding . He does take sulindac for joint pain, advised to stop this .  Noticing that chronic leg swelling is worse lately. Advised to use extra lasix as previously instructed.  Has dry cough and DOE -chronic but worse for last month.  rec Stop Sulindac .  Labs today .  Follow with Dr. [name] as planned next week.  Follow med calendar closely and bring to each visit.    09/03/2015 acute extended ov/Wert re: low grade fever/ multiple complaints new urinary incont on urology meds but no recent f/u  - 02 hs / dulear 200 2bid  Chief Complaint  Patient presents with  . Acute Visit    pt c/o non prod cough, right ear discomfort, low grade temp of 99.7, weakness, fatigue, wheezing, increase sob & dizziness X2wk  transfusion one week prior to [place] energy / sob/ saba did not   Overall worse x two weeks indolent onset/ min progressive sob not at rest  Not clear using action plan appropriately Noted urinary incontinence over last x sev weeks prior to OV  But no dysuria/ back pain  rec Please remember to go to the lab and x-ray department downstairs for your tests - we will call you with the results when they are available. Cipro 500mg  twice daily x 7 days and be sure you have follow up with your urologist  Keep your scheduled appt with me in September - call sooner if needed Add:  Encouraged to use one extra  furosemide until feet swelling resolved  As per med cal instructions   Admit date: 09/09/2015 Discharge date: 09/13/2015   Discharge  Diagnoses:  Principal Problem:   HCAP (healthcare-associated pneumonia) Active Problems:   Symptomatic anemia   Pacemaker   Chronic diastolic CHF (congestive heart failure) ([place])  Discharge Condition: Stable  Diet recommendation: Heart healthy diet discussed in details   Brief Narrative  82 year old male with history of pancytopenia secondary to early myelodysplasia, history of lymphoma on admission, complete heart block status post pacemaker, A. fib not an adequate ideation recently treated with ciprofloxacin for UTI was brought to the ED as he was feeling very weak for past 2 weeks and had a near-syncopal episode.  In the ED his hemoglobin was 6. Chest x-ray showed edema versus infiltrate. CT chest done showed multifocal pneumonia.  Subjective:   Patient reports nonproductive cough and weakness. Overall better.   Assessment &Plan :   Principal Problem: HCAP (healthcare-associated pneumonia) - pt started onEmpiric Vanco and cefepime. Follow cultures. Supportive care with Tylenol and antitussives. - plan to transition to oral Levaquin to complete therapy for PNA   Active Problems: Generalized weakness with Symptomatic pancytopenia  - Secondary to myelodysplasia. Receives transfusion as outpatient.  - has already received units PRBC transfusion since admission with IV Lasix in between transfusion.  - discussed with Dr. [name], reviewed blood counts, he recommended no Plt transfusion unless pt actively bleeding and no Neupogen unless pt septic - Hg stable in the past 24 hours - family wants to take pt home, SNF recommended but pt and family declined offer - pt did have an episode of fall in the hospital and concern was for discharge home, pt and family made aware   Chronic diastolic CHF (congestive heart failure) ([place]) - on IV Lasix in between transfusion and placed on home dose daily Lasix  Hypothyroidism  - continue Synthroid  Chronic A. fib - Not a  candidate for [facility] due to high risk bleed   Diabetes mellitus type 2 - resume home medical regimen   Lymphoma - outpatient follow up with Dr. [name] - PCT consulted as well for further goals of care discussions per pt's request   Complete heart block status post pacemaker - d/c tele   Obesity  - Body mass index is 34.02 kg/m.   10/08/2015  Post hosp f/u ov/Wert re:  AB/ obesity/ chf  ? Qualified for 02  Chief Complaint  Patient presents with  . Follow-up    Breathing is overall doing well, but he relates this to not moving around much. He has had some cough with minimal clear sputum.  more limited by weakness than sob at this point   >>no changes   11/18/2015 Follow up : AB /Obesity /CHF  Patient returns for a one-month follow-up. Patient was recently admitted 10/1 for severe pancytopenia due to MDS. He was given Neupogen on October 4. He did require a transfusion of 2 units of PRBCs.  Patient was found to have a possible bronchiectasis, exacerbation, plus or minus pneumonia and was given antibiotics and discharged on a complete course of Levaquin. 2-D echo on October 19 showed an EF of 50-55%, PAP at 47 mmHg White blood cell count was 0.8  prior to discharge. Hbg 9.0.  Labs on 11/06/2015 showed hemoglobin at 8.6 and a white blood cell count at 1.0 Followed by hematology /oncolgoy , received 1 u PRBC on 10/11.  Since discharge. Patient is feeling weak. Has very low energy .  Walk test in office without desats on room air.  Went to rehab for 2 weeks, now able to walk with walker.  We reviewed all his meds and organized them into a med calendar with pt education .  He denies chest pain, orthopnea, edema or fever.  We reviewed his meds and organized them into a med calendar with pt education . Appears to be taking correctly.     Current Medications, Allergies, Complete Past Medical History, Past Surgical History, Family History, and Social History were reviewed in [employer] record.  ROS  The following are not active complaints unless bolded sore throat, dysphagia, dental problems, itching, sneezing,  nasal congestion or excess/ purulent  secretions, ear ache,   fever, chills, sweats, unintended wt loss, classically pleuritic or exertional cp,  orthopnea pnd or leg swelling, presyncope, palpitations, abdominal pain, anorexia, nausea, vomiting, diarrhea  or change in bowel or bladder habits, change in stools or urine, dysuria,hematuria,  rash, arthralgias, visual complaints, headache, numbness, weakness or ataxia or problems with walking or coordination,  change in mood/affect or memory.          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 [place]  - MRI 11/23/2007 c/w L1-2 bulging disc indents the thecal sac with foraminal stenosis  HEALTH MAINTENANCE..........................................................................................[facility]  - Td 10/2005 and 06/10/2015  - Pneumovax 10/07 and 10/2012 and Prevnar 06/14/13   -colon 02/2005 -int/extern. hems (repeat q7y)  Complex med regimen  --Meds reviewed with pt education and computerized med calendar completed/adjusted. November 08, 2008 ,, 07/08/2015  August 21, 2009 updated 02/19/2011 , 10/23/2011 , 09/19/2013   Syncope...........................................................................................................[facility] Kitchen[facility]  - Mobitz II AV block s/p Medtronic pacemaker placement 12/11/2008  [facility].....................................................................................................[facility]  - Pruritic rash 04/2009 > tried lotrisone, referred to [facility] August 06, 2009  Memory impairment , MMSE 27/30 8/25>refer to neuro   Family History:  mother had diabetes   Social History:  Patient states former smoker.  quit smoking in 1970  No ETOH  Retired       Objective:   Physical Exam    obese wm nad wc/ bound - very chronically   ill appearing  Vitals:   11/18/15 1408  BP: 120/72  Pulse: 98  Temp: 98.1 F (36.7 C)  TempSrc: Oral  SpO2: 97%  Weight: 223 lb (101.2 kg)  Height: 5\' 11"  (1.803 m)     Vital signs reviewed  -  Physical Exam:    ENT: No sinus tenderness, TM clear, pale nasal mucosa, no oral exudate,no post nasal drip, no LAN/ ear canals  nl bilaterally  Cardiac: S1, S2, regular rate and rhythm, no murmur Chest: No wheeze/   no accessory muscle use, diminished bs in bases -faint BB crackles  Abd.: Soft Non-tender Ext: trace bilateral pitting edema both ankles Neuro:  Alert/ nl sensorium, no motor def Skin: No rashes, warm and dry Psych:appears to have  somewhat hopeless/helpless  Affect and easily confused with details of care         CXR PA and Lateral:   10/08/2015 :     Cardiomegaly. Persistent streaky atelectasis or infiltrate left base retrocardiac.

## 2015-11-18 NOTE — Assessment & Plan Note (Signed)
Pancytopenia secondary to MDS  Cont to follow with Hem/Onc.

## 2015-11-18 NOTE — Assessment & Plan Note (Signed)
Cont on O2 at At bedtime   

## 2015-11-18 NOTE — Progress Notes (Signed)
Chart and office note reviewed in detail along with available xrays/ labs > agree with a/p as outlined  

## 2015-11-18 NOTE — Patient Instructions (Addendum)
Follow med calendar closely and bring to each visit.   Follow up in 6 weeks with Dr. [name]  And As needed

## 2015-11-20 ENCOUNTER — Other Ambulatory Visit: Payer: Self-pay | Admitting: *Deleted

## 2015-11-20 ENCOUNTER — Other Ambulatory Visit (HOSPITAL_BASED_OUTPATIENT_CLINIC_OR_DEPARTMENT_OTHER): Payer: Medicare Other

## 2015-11-20 ENCOUNTER — Ambulatory Visit (HOSPITAL_BASED_OUTPATIENT_CLINIC_OR_DEPARTMENT_OTHER): Payer: Medicare Other

## 2015-11-20 ENCOUNTER — Ambulatory Visit: Payer: Medicare Other

## 2015-11-20 DIAGNOSIS — D469 Myelodysplastic syndrome, unspecified: Secondary | ICD-10-CM

## 2015-11-20 DIAGNOSIS — Z95828 Presence of other vascular implants and grafts: Secondary | ICD-10-CM

## 2015-11-20 DIAGNOSIS — D649 Anemia, unspecified: Secondary | ICD-10-CM | POA: Diagnosis not present

## 2015-11-20 DIAGNOSIS — D61818 Other pancytopenia: Secondary | ICD-10-CM

## 2015-11-20 LAB — CBC WITH DIFFERENTIAL/PLATELET
BASO%: 0 % (ref 0.0–2.0)
BASOS ABS: 0 10*3/uL (ref 0.0–0.1)
EOS%: 1.1 % (ref 0.0–7.0)
Eosinophils Absolute: 0 10*3/uL (ref 0.0–0.5)
HCT: 24 % — ABNORMAL LOW (ref 38.4–49.9)
HGB: 8.3 g/dL — ABNORMAL LOW (ref 13.0–17.1)
LYMPH%: 64.5 % — ABNORMAL HIGH (ref 14.0–49.0)
MCH: 32.4 pg (ref 27.2–33.4)
MCHC: 34.6 g/dL (ref 32.0–36.0)
MCV: 93.8 fL (ref 79.3–98.0)
MONO#: 0 10*3/uL — ABNORMAL LOW (ref 0.1–0.9)
MONO%: 1.1 % (ref 0.0–14.0)
NEUT#: 0.3 10*3/uL — CL (ref 1.5–6.5)
NEUT%: 33.3 % — AB (ref 39.0–75.0)
NRBC: 0 % (ref 0–0)
Platelets: 49 10*3/uL — ABNORMAL LOW (ref 140–400)
RBC: 2.56 10*6/uL — ABNORMAL LOW (ref 4.20–5.82)
RDW: 19.1 % — AB (ref 11.0–14.6)
WBC: 0.9 10*3/uL — CL (ref 4.0–10.3)
lymph#: 0.6 10*3/uL — ABNORMAL LOW (ref 0.9–3.3)

## 2015-11-20 LAB — PREPARE RBC (CROSSMATCH)

## 2015-11-20 MED ORDER — SODIUM CHLORIDE 0.9 % IJ SOLN
10.0000 mL | INTRAMUSCULAR | Status: DC | PRN
  Administered 2015-11-20: 10 mL via INTRAVENOUS
  Filled 2015-11-20: qty 10

## 2015-11-20 MED ORDER — DIPHENHYDRAMINE HCL 25 MG PO CAPS: 25.0000 mg | ORAL_CAPSULE | Freq: Once | ORAL | Status: DC

## 2015-11-20 MED ORDER — ACETAMINOPHEN 325 MG PO TABS: 650.0000 mg | ORAL_TABLET | Freq: Once | ORAL | Status: DC

## 2015-11-20 MED ORDER — SODIUM CHLORIDE 0.9% FLUSH
10.0000 mL | INTRAVENOUS | Status: AC | PRN
  Administered 2015-11-20: 10 mL
  Filled 2015-11-20: qty 10

## 2015-11-20 MED ORDER — FUROSEMIDE 10 MG/ML IJ SOLN
INTRAMUSCULAR | Status: AC
  Filled 2015-11-20: qty 2

## 2015-11-20 MED ORDER — HEPARIN SOD (PORK) LOCK FLUSH 100 UNIT/ML IV SOLN
500.0000 [IU] | Freq: Every day | INTRAVENOUS | Status: AC | PRN
  Administered 2015-11-20: 500 [IU]
  Filled 2015-11-20: qty 5

## 2015-11-20 MED ORDER — FUROSEMIDE 10 MG/ML IJ SOLN
20.0000 mg | Freq: Once | INTRAMUSCULAR | Status: AC
  Administered 2015-11-20: 20 mg via INTRAVENOUS

## 2015-11-20 NOTE — Patient Instructions (Signed)
Blood Transfusion, Care After  These instructions give you information about caring for yourself after your procedure. Your doctor may also give you more specific instructions. Call your doctor if you have any problems or questions after your procedure.   HOME CARE   Take medicines only as told by your doctor. Ask your doctor if you can take an over-the-counter pain reliever if you have a fever or headache a day or two after your procedure.   Return to your normal activities as told by your doctor.  GET HELP IF:    You develop redness or irritation at your IV site.   You have a fever, chills, or a headache that does not go away.   Your pee (urine) is darker than normal.   Your urine turns:    Pink.    Red.    Brown.   The white part of your eye turns yellow (jaundice).   You feel weak after doing your normal activities.  GET HELP RIGHT AWAY IF:    You have trouble breathing.   You have fever and chills and you also have:    Anxiety.    Chest or back pain.    Flushed or pink skin.    Clammy or sweaty skin.    A fast heartbeat.    A sick feeling in your stomach (nausea).     This information is not intended to replace advice given to you by your health care provider. Make sure you discuss any questions you have with your health care provider.     Document Released: 02/02/2014 Document Reviewed: 02/02/2014  [employer]vier Interactive Patient Education 2016 [employer]

## 2015-11-21 LAB — TYPE AND SCREEN
ABO/RH(D): A POS
ANTIBODY SCREEN: NEGATIVE
Unit division: 0
Unit division: 0

## 2015-11-25 ENCOUNTER — Encounter: Payer: Self-pay | Admitting: *Deleted

## 2015-11-27 ENCOUNTER — Ambulatory Visit (HOSPITAL_COMMUNITY)
Admission: RE | Admit: 2015-11-27 | Discharge: 2015-11-27 | Disposition: A | Discharge status: Home / Self Care | Payer: Medicare Other | Source: Ambulatory Visit | Attending: Oncology | Admitting: Oncology

## 2015-11-27 DIAGNOSIS — D649 Anemia, unspecified: Secondary | ICD-10-CM

## 2015-11-27 DIAGNOSIS — D469 Myelodysplastic syndrome, unspecified: Secondary | ICD-10-CM | POA: Insufficient documentation

## 2015-11-29 ENCOUNTER — Telehealth: Payer: Self-pay | Admitting: Internal Medicine

## 2015-11-29 NOTE — Telephone Encounter (Signed)
[place] with me but if he's in any distress at all should go to ER as won't get a good look at the bases of his lungs on a portable view

## 2015-11-29 NOTE — Telephone Encounter (Signed)
Called and spoke to [place]. Informed her of the recs per MW. Arbie Cookey verbalized understanding and denied any further questions or concerns at this time.

## 2015-11-29 NOTE — Telephone Encounter (Signed)
Arbie Cookey from [facility] is calling to get an order to do a portable cxr on the pt in the home.  Pt c/o bilateral pain with deep breathing in the lower part of the lungs.  Lungs sounds are clear.  MW please advise. thanks

## 2015-12-02 ENCOUNTER — Telehealth: Payer: Self-pay | Admitting: Internal Medicine

## 2015-12-03 NOTE — Telephone Encounter (Signed)
Spoke with Angie at [facility], advised that the last cxr in our chart is from beginning of October 2017.  Angie will have cxr results faxed to our office for MW's review.  Will hold message until fax is received

## 2015-12-04 ENCOUNTER — Telehealth: Payer: Self-pay | Admitting: Oncology

## 2015-12-04 ENCOUNTER — Telehealth: Payer: Self-pay | Admitting: *Deleted

## 2015-12-04 ENCOUNTER — Ambulatory Visit (HOSPITAL_BASED_OUTPATIENT_CLINIC_OR_DEPARTMENT_OTHER): Payer: Medicare Other

## 2015-12-04 ENCOUNTER — Ambulatory Visit (HOSPITAL_BASED_OUTPATIENT_CLINIC_OR_DEPARTMENT_OTHER): Payer: Medicare Other | Admitting: Oncology

## 2015-12-04 ENCOUNTER — Other Ambulatory Visit (HOSPITAL_BASED_OUTPATIENT_CLINIC_OR_DEPARTMENT_OTHER): Payer: Medicare Other

## 2015-12-04 ENCOUNTER — Ambulatory Visit: Payer: Medicare Other

## 2015-12-04 VITALS — BP 121/61 | HR 68 | Temp 97.9°F | Resp 16 | Ht 71.0 in | Wt 225.7 lb

## 2015-12-04 DIAGNOSIS — D469 Myelodysplastic syndrome, unspecified: Secondary | ICD-10-CM

## 2015-12-04 DIAGNOSIS — Z8572 Personal history of non-Hodgkin lymphomas: Secondary | ICD-10-CM

## 2015-12-04 DIAGNOSIS — Z95828 Presence of other vascular implants and grafts: Secondary | ICD-10-CM

## 2015-12-04 DIAGNOSIS — D61818 Other pancytopenia: Secondary | ICD-10-CM

## 2015-12-04 DIAGNOSIS — D472 Monoclonal gammopathy: Secondary | ICD-10-CM | POA: Diagnosis not present

## 2015-12-04 LAB — CBC WITH DIFFERENTIAL/PLATELET
BASO%: 0.3 % (ref 0.0–2.0)
BASOS ABS: 0 10*3/uL (ref 0.0–0.1)
EOS ABS: 0 10*3/uL (ref 0.0–0.5)
EOS%: 0.9 % (ref 0.0–7.0)
HEMATOCRIT: 27 % — AB (ref 38.4–49.9)
HEMOGLOBIN: 9.2 g/dL — AB (ref 13.0–17.1)
LYMPH#: 0.6 10*3/uL — AB (ref 0.9–3.3)
LYMPH%: 55.9 % — ABNORMAL HIGH (ref 14.0–49.0)
MCH: 32.8 pg (ref 27.2–33.4)
MCHC: 33.9 g/dL (ref 32.0–36.0)
MCV: 96.7 fL (ref 79.3–98.0)
MONO#: 0 10*3/uL — ABNORMAL LOW (ref 0.1–0.9)
MONO%: 0.4 % (ref 0.0–14.0)
NEUT%: 42.5 % (ref 39.0–75.0)
NEUTROS ABS: 0.5 10*3/uL — AB (ref 1.5–6.5)
Platelets: 57 10*3/uL — ABNORMAL LOW (ref 140–400)
RBC: 2.79 10*6/uL — ABNORMAL LOW (ref 4.20–5.82)
RDW: 20.1 % — AB (ref 11.0–14.6)
WBC: 1.1 10*3/uL — AB (ref 4.0–10.3)

## 2015-12-04 LAB — PREPARE RBC (CROSSMATCH)

## 2015-12-04 MED ORDER — DIPHENHYDRAMINE HCL 25 MG PO CAPS
ORAL_CAPSULE | ORAL | Status: AC
  Filled 2015-12-04: qty 1

## 2015-12-04 MED ORDER — ACETAMINOPHEN 325 MG PO TABS
ORAL_TABLET | ORAL | Status: AC
  Filled 2015-12-04: qty 2

## 2015-12-04 MED ORDER — SODIUM CHLORIDE 0.9 % IV SOLN
250.0000 mL | Freq: Once | INTRAVENOUS | Status: AC
  Administered 2015-12-04: 250 mL via INTRAVENOUS

## 2015-12-04 MED ORDER — HEPARIN SOD (PORK) LOCK FLUSH 100 UNIT/ML IV SOLN
500.0000 [IU] | Freq: Every day | INTRAVENOUS | Status: AC | PRN
  Administered 2015-12-04: 500 [IU]
  Filled 2015-12-04: qty 5

## 2015-12-04 MED ORDER — DIPHENHYDRAMINE HCL 25 MG PO CAPS: 25.0000 mg | ORAL_CAPSULE | Freq: Once | ORAL | Status: DC

## 2015-12-04 MED ORDER — ACETAMINOPHEN 325 MG PO TABS: 650.0000 mg | ORAL_TABLET | Freq: Once | ORAL | Status: DC

## 2015-12-04 MED ORDER — SODIUM CHLORIDE 0.9 % IJ SOLN
10.0000 mL | INTRAMUSCULAR | Status: DC | PRN
  Administered 2015-12-04: 10 mL via INTRAVENOUS
  Filled 2015-12-04: qty 10

## 2015-12-04 MED ORDER — SODIUM CHLORIDE 0.9% FLUSH
10.0000 mL | INTRAVENOUS | Status: AC | PRN
  Administered 2015-12-04: 10 mL
  Filled 2015-12-04: qty 10

## 2015-12-04 NOTE — Patient Instructions (Addendum)
Blood Transfusion   A blood transfusion is a procedure that gives you donated blood through an IV tube. You may need blood because of illness, surgery, or injury. The blood may come from a donor. The blood may also be your own blood that you donated earlier.  The blood you get is made up of different types of cells. You may get:    Red blood cells. These carry oxygen and replace lost blood.    Platelets. These control bleeding.    Plasma. This helps blood to clot.  If you have a clotting disorder, you may also get other types of blood products.   BEFORE THE PROCEDURE   You may have a blood test. This finds out what type of blood you have. It also finds out what kind of blood your body will accept.    If you are going to have a planned surgery, you may donate your own blood. This is done in case you need to have a transfusion.    If you have had an allergic transfusion reaction before, you may be given medicine to help prevent a reaction. Take this medicine only as told by your doctor.   You will have your temperature, blood pressure, and pulse checked.  PROCEDURE    An IV will be started in your hand or arm.    The bag of donated blood will be attached to your IV and run into your vein.    A doctor will regularly check your temperature, blood pressure, and pulse during the procedure. This is done to find any early signs of a transfusion reaction.   If you have any signs or symptoms of a reaction, the procedure may be stopped and you may be given medicine.    When the transfusion is over, your IV will be removed.    Pressure may be applied to the IV site for a few minutes.    A bandage (dressing) will be applied.   The procedure may vary among doctors and hospitals.   AFTER THE PROCEDURE   Your blood pressure, temperature, and pulse will be checked regularly.     This information is not intended to replace advice given to you by your health care provider. Make sure you discuss any questions  you have with your health care provider.     Document Released: 04/10/2008 Document Revised: 02/02/2014 Document Reviewed: 11/22/2013  Elsevier Interactive Patient Education 2016 Elsevier Inc.

## 2015-12-04 NOTE — Progress Notes (Signed)
Hematology and Oncology Follow Up Visit  [name] [id number] 1932-10-31 82 y.o. 12/04/2015 9:16 AM  CC: [name]. Victor Novas, MD, FCCP    Principle Diagnosis: 82 year old gentleman with the following diagnoses:   1. Follicular lymphoma, grade 3, stage III, diagnosed 2007. Continued to be in remission. 2. Pancytopenia associated with mild neutropenia and macrocytosis possibly indicating early myelodysplasia. Bone marrow  was repeated on 06/22/2012 confirmed the evidence of myelodysplastic syndrome 3. Monoclonal gammopathy of undetermined significance.  Prior Therapy:  1. Status post CHOP and rituximab.  He had complete response to therapy concluded in 2007.  No further imaging has been indicated at this time. 2. Status post bone marrow biopsy in April 2011, did not really show any evidence of myelodysplasia or metastatic lymphoma at this time.  Current therapy: PRBC transfusions to keep Hgb above 9.  Interim History: [name] presents today for a followup visit with his daughter. Since his last visit, he was hospitalized In October 2017 for neutropenic fever and community-acquired pneumonia. He was discharged on 11/02/2015 and received a rehabilitation at skilled nursing facility. He was discharged home and have been home for the last 2 weeks. He has been managing reasonably well at home and has not reported any falls or syncope. Has not had any recent fevers, chills or sweats.  He does report exertional dyspnea and occasional cough which is unchanged. He denied any hemoptysis or wheezing. He does not report any bleeding episodes such as epistaxis, hematochezia or melena. His appetite is reasonable and started to gain weight after a period of weight loss. His performance status remains poor although not declining.   He did not report any headaches, blurry vision syncope or seizures. He does not report any palpitation or orthopnea. Has not reported any edema. Does not report any wheezing  but still has exertional dyspnea.  He does not report any lymphadenopathy or petechiae. Has not reported any skin rashes or genitourinary complaints. He denied any nausea, vomiting, abdominal pain. He denied any early satiety or distention. Remainder of review of systems unremarkable.   Medications: I have reviewed the patient's current medications Current Outpatient Prescriptions  Medication Sig Dispense Refill  . albuterol (PROVENTIL HFA;VENTOLIN HFA) 108 (90 Base) MCG/ACT inhaler Inhale 2 puffs into the lungs every 4 (four) hours as needed for wheezing or shortness of breath.    [name] albuterol (PROVENTIL) (2.5 MG/3ML) 0.083% nebulizer solution Take 3 mLs (2.5 mg total) by nebulization every 4 (four) hours as needed for wheezing or shortness of breath. 75 mL 12  . ALPRAZolam (XANAX) 0.25 MG tablet Take 1 tablet (0.25 mg total) by mouth at bedtime as needed for anxiety or sleep. 20 tablet 0  . calcium-vitamin D (OS-CAL 500 + D) 500-200 MG-UNIT per tablet Take 1 tablet by mouth 2 (two) times daily.     . chlorpheniramine (CHLOR-TRIMETON) 4 MG tablet Take 4 mg by mouth every 4 (four) hours as needed (drippy nose).    [name] dextromethorphan-guaiFENesin (MUCINEX DM) 30-600 MG 12hr tablet Take 1-2 tablets by mouth 2 (two) times daily as needed for cough.    . docusate sodium (COLACE) 100 MG capsule Take 1 capsule (100 mg total) by mouth 2 (two) times daily. Hold for constipation    . famotidine (PEPCID) 20 MG tablet Take 20 mg by mouth at bedtime.     . finasteride (PROSCAR) 5 MG tablet TAKE 1 TABLET BY MOUTH EVERY MORNING (Patient taking differently: TAKE 5 MG BY MOUTH EVERY MORNING) 30 tablet  5  . fluticasone (FLONASE) 50 MCG/ACT nasal spray 1-2 puffs each nostril once or twice daily for allergy (Patient taking differently: Place 2 sprays into both nostrils 2 (two) times daily. ) 16 g 12  . furosemide (LASIX) 40 MG tablet Take 1 tablet (40 mg total) by mouth daily. 60 tablet 1  . levothyroxine (SYNTHROID,  LEVOTHROID) 25 MCG tablet TAKE 1 TABLET BY MOUTH EVERY MORNING BEFORE BREAKFAST (Patient taking differently: TAKE 25 MCG BY MOUTH EVERY MORNING BEFORE BREAKFAST) 30 tablet 11  . metFORMIN (GLUCOPHAGE) 500 MG tablet TAKE 1 TABLET BY MOUTH TWICE A DAY WITH FOOD (Patient taking differently: TAKE 250 MG BY MOUTH TWICE A DAY WITH FOOD) 60 tablet 5  . mometasone-formoterol (DULERA) 200-5 MCG/ACT AERO Inhale 2 puffs into the lungs every 12 (twelve) hours.    . Multiple Vitamin (MULTIVITAMIN) tablet Take 1 tablet by mouth every morning.     . nitroGLYCERIN (NITROSTAT) 0.4 MG SL tablet Place 1 tablet (0.4 mg total) under the tongue every 5 (five) minutes as needed for chest pain. 90 tablet 3  . omeprazole (PRILOSEC) 20 MG capsule Take 40 mg by mouth daily before breakfast.     . oxybutynin (DITROPAN-XL) 10 MG 24 hr tablet Take 10 mg by mouth every morning. Reported on 05/13/2015    . OXYGEN 2 lpm with sleep    . polyethylene glycol (MIRALAX / GLYCOLAX) packet Take 17 g by mouth daily as needed for moderate constipation or severe constipation.     . potassium chloride SA (KLOR-CON M20) 20 MEQ tablet Take 1 tablet (20 mEq total) by mouth 2 (two) times daily. 60 tablet 1  . traMADol (ULTRAM) 50 MG tablet Take 1 tablet (50 mg total) by mouth every 6 (six) hours as needed. (Patient taking differently: Take 50 mg by mouth every 4 (four) hours as needed. ) 20 tablet 0  . traZODone (DESYREL) 150 MG tablet Take 1 tablet (150 mg total) by mouth at bedtime. 30 tablet 0   No current facility-administered medications for this visit.      Allergies: No Known Allergies  Past Medical History, Surgical history, Social history, and Family History were reviewed and updated.    Physical Exam: Blood pressure 121/61, pulse 68, temperature 97.9 F (36.6 C), temperature source Oral, resp. rate 16, height _0  (1.803 m), weight 225 lb 11.2 oz (102.4 kg), SpO2 99 %. ECOG: 1 General appearance: Chronically ill-appearing  gentleman appeared without distress. Head: Normocephalic, without obvious abnormality. No oral ulcers or lesions. Neck: no adenopathy. No palpable masses. Lymph nodes: Cervical, supraclavicular, and axillary nodes normal. Heart:regular rate and rhythm, S1, S2 normal, no murmur, click, rub or gallop Lung:chest clear, no wheezing, rales, normal symmetric air entry. No dullness to percussion. Abdomen: soft, non-tender, without masses or organomegaly. No shifting dullness or ascites. EXT:no erythema, induration, or nodules. No edema. Skin: No rashes or lesions noted.    Lab Results: Lab Results  Component Value Date   WBC 1.1 (L) 12/04/2015   HGB 9.2 (L) 12/04/2015   HCT 27.0 (L) 12/04/2015   MCV 96.7 12/04/2015   PLT 57 (L) 12/04/2015     Chemistry      Component Value Date/Time   NA 131 (L) 11/02/2015 0415   NA 136 09/23/2015 1103   K 4.3 11/02/2015 0415   K 4.0 09/23/2015 1103   CL 94 (L) 11/02/2015 0415   CL 99 06/08/2012 1257   CO2 29 11/02/2015 0415   CO2 26 09/23/2015 1103  BUN 23 (H) 11/02/2015 0415   BUN 14.6 09/23/2015 1103   CREATININE 0.72 11/02/2015 0415   CREATININE 0.8 09/23/2015 1103      Component Value Date/Time   CALCIUM 8.7 (L) 11/02/2015 0415   CALCIUM 9.2 09/23/2015 1103   ALKPHOS 43 10/28/2015 0350   ALKPHOS 93 09/23/2015 1103   AST 21 10/28/2015 0350   AST 35 (H) 09/23/2015 1103   ALT 28 10/28/2015 0350   ALT 45 09/23/2015 1103   BILITOT 0.8 10/28/2015 0350   BILITOT 0.66 09/23/2015 1103      Impression and Plan:  This is a 82 year old gentleman with the following issues:  1. Follicular lymphoma diagnosed in 2007. He continues to be in remission at this time. 2. Myelodysplastic syndrome. He is status post bone marrow biopsy was done on 06/22/2012. He is currently on supportive transfusion only due to the lack of response from [facility]. The plan is to continue supportive transfusion and care. He is currently transfusion dependent and will  continue to do so rest of his life.His hemoglobin is around 9 at this time and we have discussed the risks and benefits of given 1 unit of blood and he is agreeable. The risk of ongoing pack red cell transfusion were discussed with the patient and his daughter including congestive heart failure and on overload. He feels better after transfusion and for palliative purposes we will continue this process. 3. Neutropenia: Neutropenic precautions were given to the patient today. He had a recent hospitalization for pneumonia which resolved at this time. 4. Monoclonal gammopathy.  His M-spike had been less than 1 g/dL, his bone marrow biopsy continued to show a less than 8% plasma cells indicating most likely multiple myeloma.  Last SPEP was done in September 2016 and this will be repeated annually. 5. Thrombocytopenia: Related to his marrow failure. No bleeding noted at this time. 6. IV access: Port-A-Cath is in place without complications or bleeding. 7. Followup every 2 weeks for an injection and possible transfusion. He will have a clinical visit in 2 months.  [id number],[id number], MD 11/8/20179:16 AM

## 2015-12-04 NOTE — Telephone Encounter (Signed)
Per dr [name], patient is to have lab and 5 hour blood transfusion appt every 2 weeks. Message sent to schedulers as such and to add blood transfusion to appts made today. Daughter stephanie notified that schedulers will be calling.

## 2015-12-04 NOTE — Telephone Encounter (Signed)
-----   Message from Wyatt Portela, MD sent at 12/04/2015 12:51 PM EST ----- He needs Lab + 5 hr blood every two weeks. He needs to scheduled as such.   ----- Message ----- From: Randolm Idol, RN Sent: 12/04/2015  12:48 PM To: Wyatt Portela, MD  Mr Dambrosio's family is asking if we are scheduling him for blood after his labs. Did not show on today's calendar. Please advise

## 2015-12-04 NOTE — Telephone Encounter (Signed)
Per LOS I have scheduled appts and notified the scheduler 

## 2015-12-04 NOTE — Telephone Encounter (Signed)
Message sent to infusion scheduler to be added. Appointments scheduled per 12/04/15 los. AVS report and appointment schedule was given to patient, per 12/04/15 los.

## 2015-12-05 LAB — TYPE AND SCREEN
ABO/RH(D): A POS
ANTIBODY SCREEN: NEGATIVE
Unit division: 0

## 2015-12-06 NOTE — Telephone Encounter (Signed)
Spoke with Angie I asked that she refax the cxr, b/c I don't have it  Will await fax

## 2015-12-06 NOTE — Telephone Encounter (Signed)
Magda Paganini please advise if you've received cxr results on pt, or if these need to be re-requested.  Thanks!

## 2015-12-11 ENCOUNTER — Ambulatory Visit: Payer: Medicare Other | Admitting: Oncology

## 2015-12-11 ENCOUNTER — Other Ambulatory Visit: Payer: Medicare Other

## 2015-12-12 NOTE — Telephone Encounter (Signed)
[name], have you received CXR results yet? If not we can request them again. Thanks.

## 2015-12-12 NOTE — Telephone Encounter (Signed)
Yes, MW commented cxr showed chronic changes, nothing acute and no changes needed Spoke with the Angie and notified of this and she verbalized understanding

## 2015-12-17 ENCOUNTER — Telehealth: Payer: Self-pay | Admitting: *Deleted

## 2015-12-17 NOTE — Telephone Encounter (Signed)
"  I'm calling to verify appointment for my Dad tomorrow for blood."  Provided appointment information for lab and 6-hour transfusion.

## 2015-12-18 ENCOUNTER — Other Ambulatory Visit: Payer: Self-pay | Admitting: *Deleted

## 2015-12-18 ENCOUNTER — Ambulatory Visit (HOSPITAL_BASED_OUTPATIENT_CLINIC_OR_DEPARTMENT_OTHER): Payer: Medicare Other

## 2015-12-18 ENCOUNTER — Encounter: Payer: Self-pay | Admitting: *Deleted

## 2015-12-18 ENCOUNTER — Other Ambulatory Visit (HOSPITAL_BASED_OUTPATIENT_CLINIC_OR_DEPARTMENT_OTHER): Payer: Medicare Other

## 2015-12-18 DIAGNOSIS — D649 Anemia, unspecified: Secondary | ICD-10-CM

## 2015-12-18 DIAGNOSIS — D61818 Other pancytopenia: Secondary | ICD-10-CM

## 2015-12-18 DIAGNOSIS — D469 Myelodysplastic syndrome, unspecified: Secondary | ICD-10-CM | POA: Diagnosis present

## 2015-12-18 LAB — CBC WITH DIFFERENTIAL/PLATELET
BASO%: 0 % (ref 0.0–2.0)
Basophils Absolute: 0 10*3/uL (ref 0.0–0.1)
EOS%: 1.2 % (ref 0.0–7.0)
Eosinophils Absolute: 0 10*3/uL (ref 0.0–0.5)
HCT: 20.8 % — ABNORMAL LOW (ref 38.4–49.9)
HGB: 7.2 g/dL — ABNORMAL LOW (ref 13.0–17.1)
LYMPH%: 55.8 % — AB (ref 14.0–49.0)
MCH: 33 pg (ref 27.2–33.4)
MCHC: 34.6 g/dL (ref 32.0–36.0)
MCV: 95.4 fL (ref 79.3–98.0)
MONO#: 0 10*3/uL — ABNORMAL LOW (ref 0.1–0.9)
MONO%: 1.2 % (ref 0.0–14.0)
NEUT#: 0.4 10*3/uL — CL (ref 1.5–6.5)
NEUT%: 41.8 % (ref 39.0–75.0)
PLATELETS: 42 10*3/uL — AB (ref 140–400)
RBC: 2.18 10*6/uL — AB (ref 4.20–5.82)
RDW: 21.4 % — ABNORMAL HIGH (ref 11.0–14.6)
WBC: 0.9 10*3/uL — CL (ref 4.0–10.3)
lymph#: 0.5 10*3/uL — ABNORMAL LOW (ref 0.9–3.3)

## 2015-12-18 LAB — PREPARE RBC (CROSSMATCH)

## 2015-12-18 MED ORDER — FUROSEMIDE 10 MG/ML IJ SOLN
20.0000 mg | Freq: Once | INTRAMUSCULAR | Status: AC
  Administered 2015-12-18: 20 mg via INTRAVENOUS

## 2015-12-18 MED ORDER — FUROSEMIDE 10 MG/ML IJ SOLN
INTRAMUSCULAR | Status: AC
  Filled 2015-12-18: qty 2

## 2015-12-18 MED ORDER — SODIUM CHLORIDE 0.9 % IV SOLN
250.0000 mL | Freq: Once | INTRAVENOUS | Status: AC
  Administered 2015-12-18: 250 mL via INTRAVENOUS

## 2015-12-18 MED ORDER — ACETAMINOPHEN 325 MG PO TABS: 650.0000 mg | ORAL_TABLET | Freq: Once | ORAL | Status: DC

## 2015-12-18 MED ORDER — HEPARIN SOD (PORK) LOCK FLUSH 100 UNIT/ML IV SOLN
500.0000 [IU] | Freq: Every day | INTRAVENOUS | Status: AC | PRN
  Administered 2015-12-18: 500 [IU]
  Filled 2015-12-18: qty 5

## 2015-12-18 MED ORDER — SODIUM CHLORIDE 0.9% FLUSH
10.0000 mL | INTRAVENOUS | Status: AC | PRN
  Administered 2015-12-18: 10 mL
  Filled 2015-12-18: qty 10

## 2015-12-18 MED ORDER — DIPHENHYDRAMINE HCL 25 MG PO CAPS: 25.0000 mg | ORAL_CAPSULE | Freq: Once | ORAL | Status: DC

## 2015-12-18 NOTE — Patient Instructions (Signed)
Blood Transfusion , Adult A blood transfusion is a procedure in which you receive donated blood, including plasma, platelets, and red blood cells, through an IV tube. You may need a blood transfusion because of illness, surgery, or injury. The blood may come from a donor. You may also be able to donate blood for yourself (autologous blood donation) before a surgery if you know that you might require a blood transfusion. The blood given in a transfusion is made up of different types of cells. You may receive:  Red blood cells. These carry oxygen to the cells in the body.  White blood cells. These help you fight infections.  Platelets. These help your blood to clot.  Plasma. This is the liquid part of your blood and it helps with fluid imbalances. If you have hemophilia or another clotting disorder, you may also receive other types of blood products. Tell a health care provider about:  Any allergies you have.  All medicines you are taking, including vitamins, herbs, eye drops, creams, and over-the-counter medicines.  Any problems you or family members have had with anesthetic medicines.  Any blood disorders you have.  Any surgeries you have had.  Any medical conditions you have, including any recent fever or cold symptoms.  Whether you are pregnant or may be pregnant.  Any previous reactions you have had during a blood transfusion. What are the risks? Generally, this is a safe procedure. However, problems may occur, including:  Having an allergic reaction to something in the donated blood. Hives and itching may be symptoms of this type of reaction.  Fever. This may be a reaction to the white blood cells in the transfused blood. Nausea or chest pain may accompany a fever.  Iron overload. This can happen from having many transfusions.  Transfusion-related acute lung injury (TRALI). This is a rare reaction that causes lung damage. The cause is not known.TRALI can occur within hours  of a transfusion or several days later.  Sudden (acute) or delayed hemolytic reactions. This happens if your blood does not match the cells in your transfusion. Your body's defense system (immune system) may try to attack the new cells. This complication is rare. The symptoms include fever, chills, nausea, and low back pain or chest pain.  Infection or disease transmission. This is rare. What happens before the procedure?  You will have a blood test to determine your blood type. This is necessary to know what kind of blood your body will accept and to match it to the donor blood.  If you are going to have a planned surgery, you may be able to do an autologous blood donation. This may be done in case you need to have a transfusion.  If you have had an allergic reaction to a transfusion in the past, you may be given medicine to help prevent a reaction. This medicine may be given to you by mouth or through an IV tube.  You will have your temperature, blood pressure, and pulse monitored before the transfusion.  Follow instructions from your health care provider about eating and drinking restrictions.  Ask your health care provider about:  Changing or stopping your regular medicines. This is especially important if you are taking diabetes medicines or blood thinners.  Taking medicines such as aspirin and ibuprofen. These medicines can thin your blood. Do not take these medicines before your procedure if your health care provider instructs you not to. What happens during the procedure?  An IV tube will be   inserted into one of your veins.  The bag of donated blood will be attached to your IV tube. The blood will then enter through your vein.  Your temperature, blood pressure, and pulse will be monitored regularly during the transfusion. This monitoring is done to detect early signs of a transfusion reaction.  If you have any signs or symptoms of a reaction, your transfusion will be stopped and  you may be given medicine.  When the transfusion is complete, your IV tube will be removed.  Pressure may be applied to the IV site for a few minutes.  A bandage (dressing) will be applied. The procedure may vary among health care providers and hospitals. What happens after the procedure?  Your temperature, blood pressure, heart rate, breathing rate, and blood oxygen level will be monitored often.  Your blood may be tested to see how you are responding to the transfusion.  You may be warmed with fluids or blankets to maintain a normal body temperature. Summary  A blood transfusion is a procedure in which you receive donated blood, including plasma, platelets, and red blood cells, through an IV tube.  Your temperature, blood pressure, and pulse will be monitored before, during, and after the transfusion.  Your blood may be tested after the transfusion to see how your body has responded. This information is not intended to replace advice given to you by your health care provider. Make sure you discuss any questions you have with your health care provider. Document Released: 01/10/2000 Document Revised: 10/10/2015 Document Reviewed: 10/10/2015 Elsevier Interactive Patient Education  2017 Elsevier Inc.  

## 2015-12-19 LAB — TYPE AND SCREEN
ABO/RH(D): A POS
Antibody Screen: NEGATIVE
UNIT DIVISION: 0
Unit division: 0

## 2015-12-24 ENCOUNTER — Other Ambulatory Visit: Payer: Self-pay | Admitting: Internal Medicine

## 2015-12-24 NOTE — Telephone Encounter (Signed)
MW please advise on refill. Thanks. 

## 2015-12-27 ENCOUNTER — Ambulatory Visit (HOSPITAL_COMMUNITY): Payer: Medicare Other | Attending: Oncology

## 2015-12-30 ENCOUNTER — Ambulatory Visit: Payer: Medicare Other | Admitting: Internal Medicine

## 2015-12-31 ENCOUNTER — Telehealth: Payer: Self-pay | Admitting: Internal Medicine

## 2015-12-31 NOTE — Telephone Encounter (Signed)
Pt found deceased at home on commode by Daughter Jan 22, 2016 ? S/p cardiac arrest and had questions re his cause of death but I have nothing to go on to shed further light. He was terminally ill with pancytopenia and more and more freq admits for pna and had agreed with Dr [name] to start EOL discussions at next visit so it did not come at all as a surprise that he had passed  I expressed condolences to daughter and thanked her for all she did to help keep him safe and comfortable for all the time he had left.

## 2015-12-31 NOTE — Telephone Encounter (Signed)
Spoke with daughter who explained that her father passed away 20-Jan-2023. She questioned if the device could still be interrogated for a potential cause of death as she feels this came suddenly and unexpectedly. I voiced it was possible to still interrogate the device though it may not show the "cause" that she was looking for if it was not cardiac related. She stated that she would call back after speaking with her brother if they chose to move in this direction. She voiced appreciation for all of Dr. Jackalyn Lombard care and requested that I notify him of patient's passing.

## 2015-12-31 NOTE — Telephone Encounter (Signed)
Spoke with pt's daughter, [name]. She is wanting to speak to MW about her father's death. Pt passed away on 05/10/2022. [name] has several questions she would like to speak to MW about.  MW - would you mind calling this pt's daughter? Thanks.

## 2016-01-01 ENCOUNTER — Telehealth: Payer: Self-pay | Admitting: Internal Medicine

## 2016-01-01 ENCOUNTER — Other Ambulatory Visit: Payer: Medicare Other

## 2016-01-01 NOTE — Telephone Encounter (Signed)
Pt daughter called back. Informed her that pt's remote transmission was received. Please call back to discuss results. Pt daughter is looking for answers to pt death so she can get some closure.

## 2016-01-01 NOTE — Telephone Encounter (Signed)
New message      Calling to give Erasmo Downer an update

## 2016-01-02 NOTE — Telephone Encounter (Signed)
Spoke with daughter who asked If I could identify a time of death on her father's remote transmission. I explained that since the pacemaker was still functioning that the only thing I would see is electrical impulses from the pacemaker and therefore could not tell when his own intrinsic heart rate had stopped. She verbalized understanding.

## 2016-01-04 ENCOUNTER — Other Ambulatory Visit: Payer: Self-pay | Admitting: Nurse Practitioner

## 2016-01-13 ENCOUNTER — Ambulatory Visit: Payer: Medicare Other | Admitting: Nurse Practitioner

## 2016-01-14 ENCOUNTER — Telehealth: Payer: Self-pay | Admitting: Internal Medicine

## 2016-01-14 ENCOUNTER — Ambulatory Visit: Payer: Medicare Other | Admitting: Nurse Practitioner

## 2016-01-14 NOTE — Telephone Encounter (Signed)
Called and spoke with Vaughan Basta from [facility]---she was calling to see if MW would be willing to sign off on the pts death certificate.  She stated that the daughter told her that MW had been seeing the pt regularly and if they needed anything that they could call and MW would help with the pt.  MW please advise. Thank  Vaughan Basta is ok with a call back tomorrow.

## 2016-01-15 ENCOUNTER — Other Ambulatory Visit: Payer: Medicare Other

## 2016-01-15 NOTE — Telephone Encounter (Signed)
Spoke with Vaughan Basta at [facility]. She is aware that MW will sign pt's death certificate. Vaughan Basta states that she will be mailing this [place].

## 2016-01-15 NOTE — Telephone Encounter (Signed)
Yes that's fine 

## 2016-01-16 ENCOUNTER — Telehealth: Payer: Self-pay

## 2016-01-16 NOTE — Telephone Encounter (Signed)
On 01/16/2016 I received a death certificate from [facility] (original). The death certificate is for burial. The patient is a patient of Human resources officer. The death certificate will be taken to Pulmonary Unit @ [facility] this pm for signature.  On 02-05-2016 I received the death certificate back from Doctor Wert. I got the death certificate ready and called the funeral home to let them know the death certificate was mailed to [facility] Department per the funeral home request.

## 2016-01-27 DEATH — deceased

## 2016-01-29 ENCOUNTER — Ambulatory Visit: Payer: Medicare Other | Admitting: Oncology

## 2016-01-29 ENCOUNTER — Other Ambulatory Visit: Payer: Medicare Other

## 2017-03-04 IMAGING — CR DG RIBS W/ CHEST 3+V*L*
7 series · 7 of 7 positions shown · non-contrast
Comparison: Chest 07/18/2014

CLINICAL DATA: Nausea, lightheadedness, low back pain and left
costal pain after unwitnessed fall this evening.

EXAM:
LEFT RIBS AND CHEST - 3+ VIEW

[t chest supine (1 of 2)]
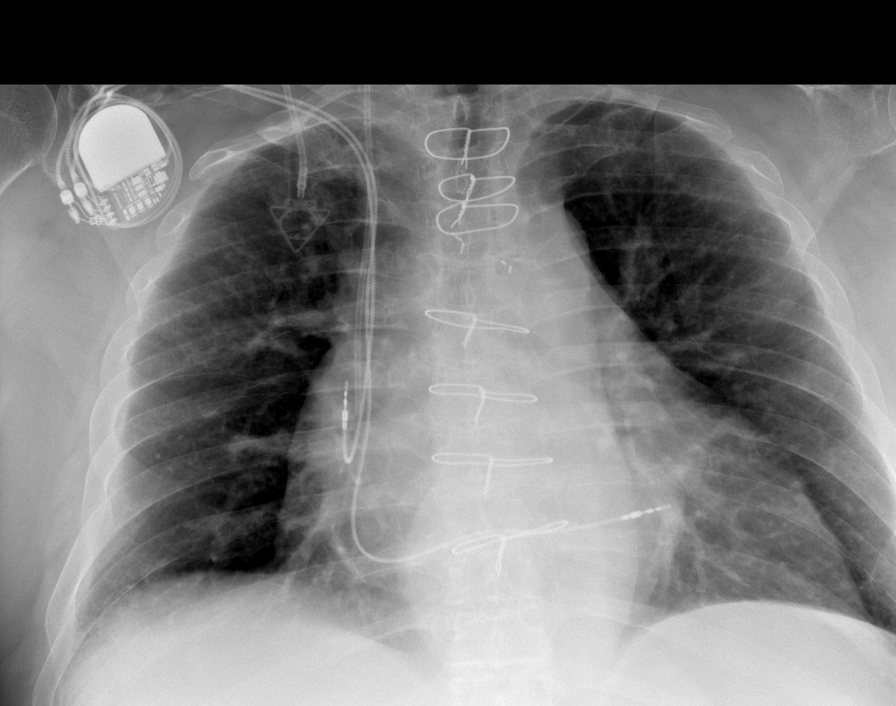

[t chest supine (2 of 2)]
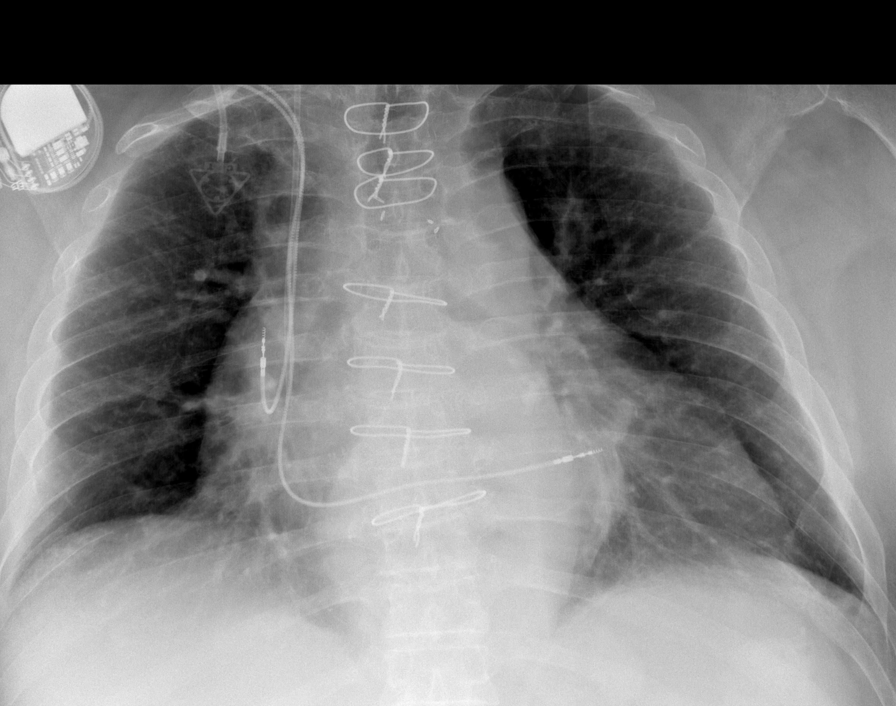

[t ribs ap upper left]
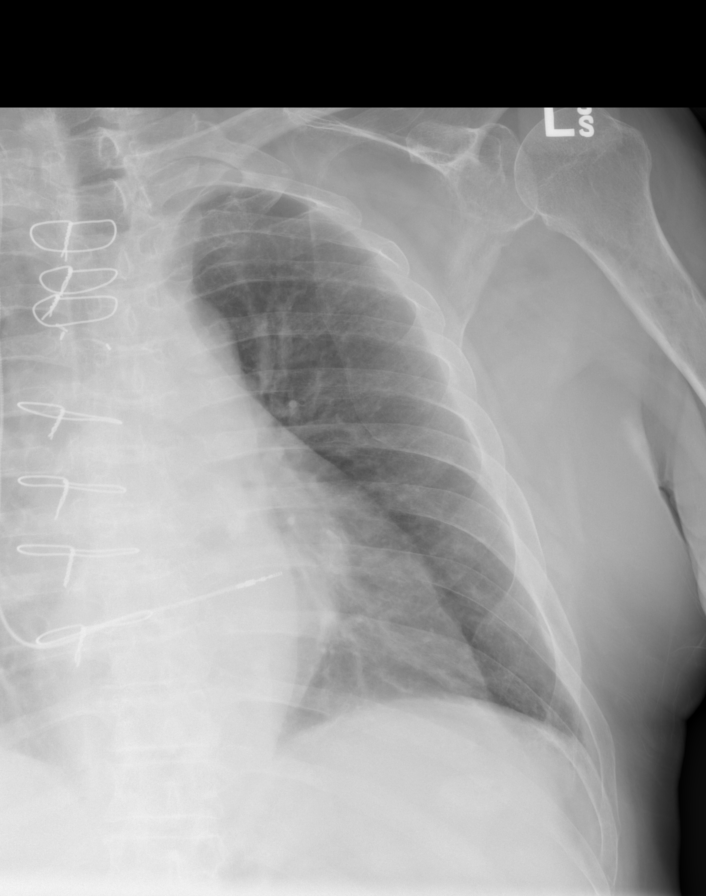

[t ribs ap lower left]
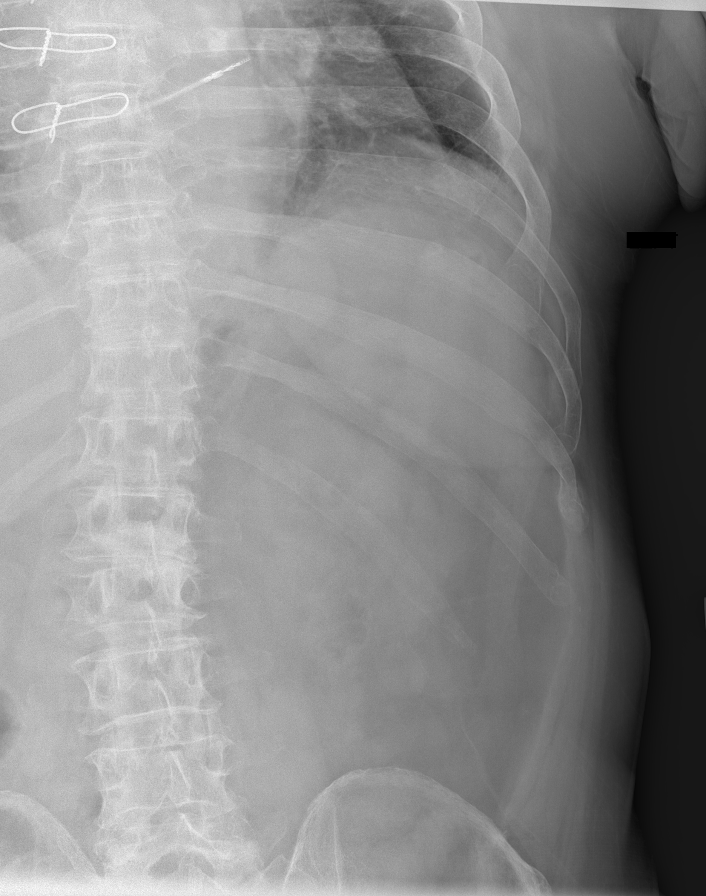

[t ribs lpo left (1 of 3)]
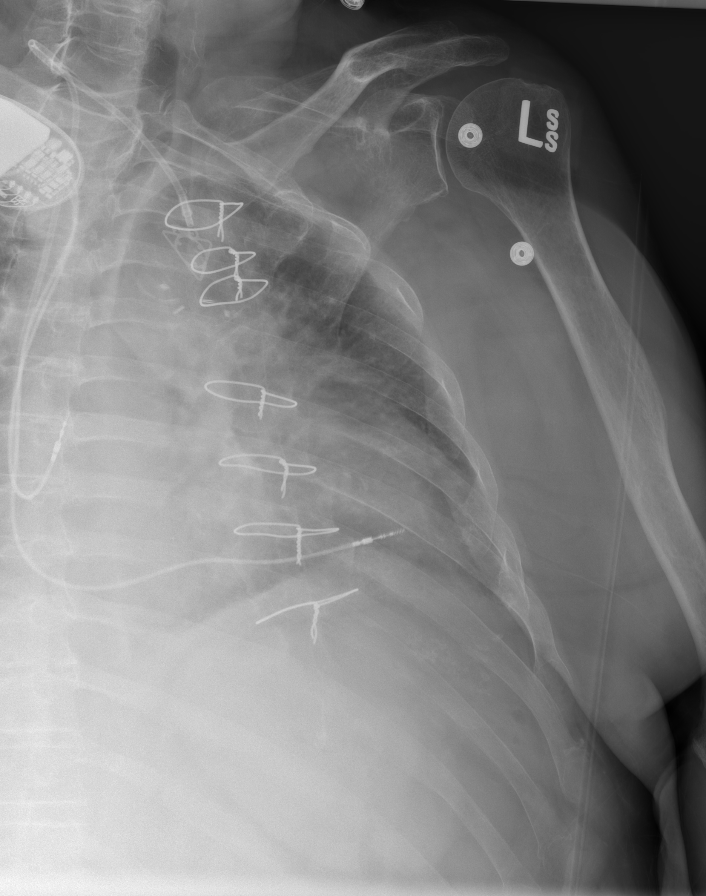

[t ribs lpo left (2 of 3)]
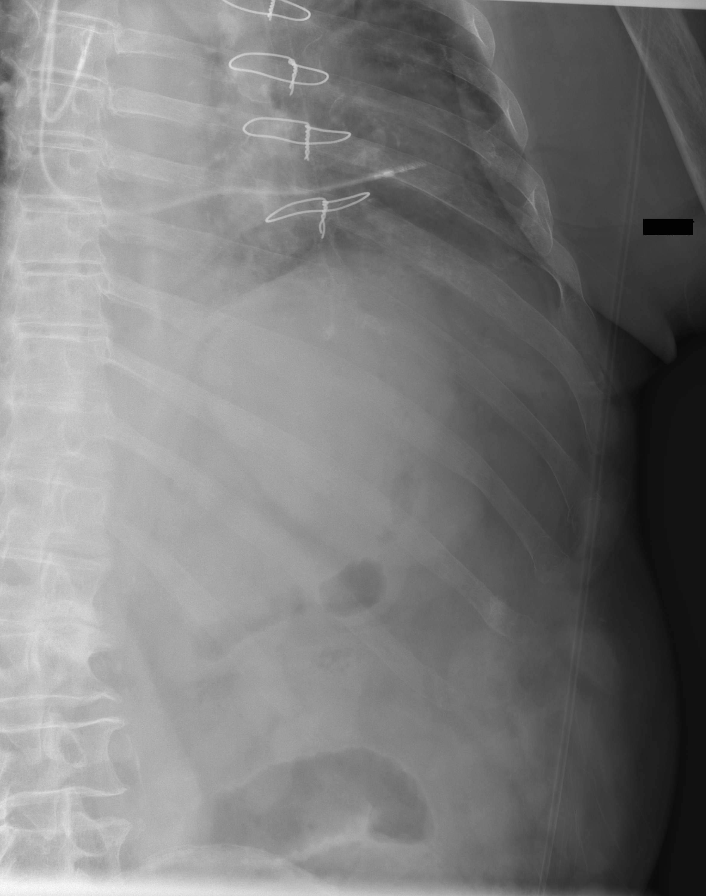

[t ribs lpo left (3 of 3)]
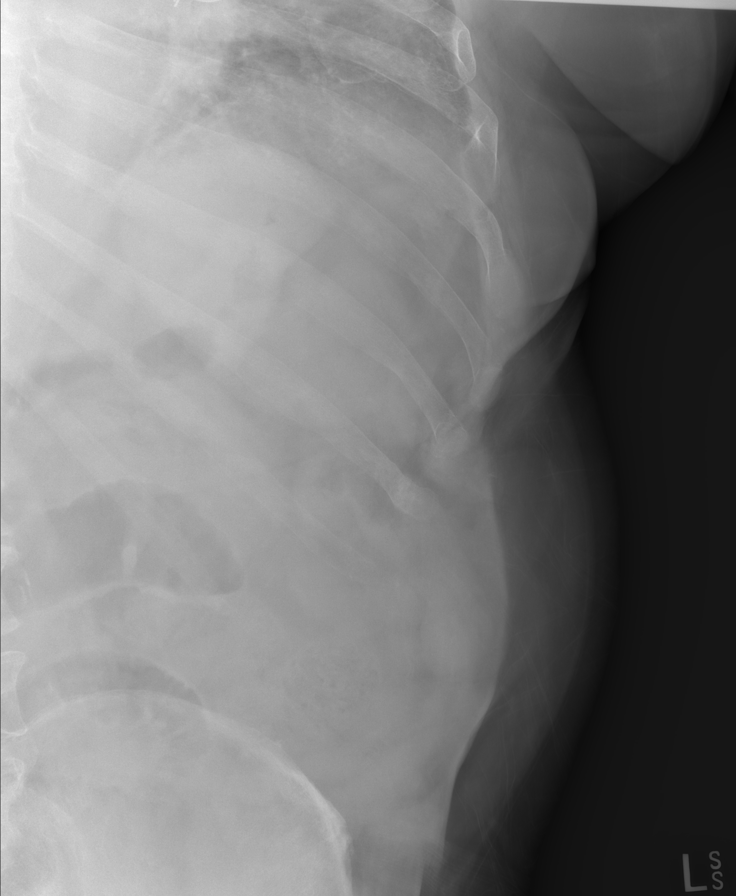

[7 of 7 positions shown; findings below may reference images not displayed]

FINDINGS: Postoperative changes in the mediastinum. Cardiac pacemaker. Power
port type central venous catheter with tip over the low SVC region.
No pneumothorax. Cardiac enlargement without vascular congestion. No
focal airspace disease or consolidation in the lungs. No blunting of
costophrenic angles. No pneumothorax. Probable esophageal hiatal
hernia behind the heart.

Left ribs appear intact. No acute displaced fractures or focal bone
lesions identified.
IMPRESSION: Cardiac enlargement. No evidence of active pulmonary disease.
Negative left ribs.

## 2018-05-31 ENCOUNTER — Other Ambulatory Visit: Payer: Self-pay | Admitting: *Deleted

## 2018-06-05 ENCOUNTER — Encounter
